# Patient Record
Sex: Female | Born: 1950 | Race: White | Hispanic: No | Marital: Single | Smoking: Former smoker
Health system: Southern US, Community
[De-identification: ages and names within clinical notes are randomized; demographics above are authoritative.]

## PROBLEM LIST (undated history)

## (undated) DIAGNOSIS — F329 Major depressive disorder, single episode, unspecified: Secondary | ICD-10-CM

## (undated) DIAGNOSIS — F419 Anxiety disorder, unspecified: Secondary | ICD-10-CM

## (undated) DIAGNOSIS — R519 Headache, unspecified: Secondary | ICD-10-CM

## (undated) DIAGNOSIS — R51 Headache: Secondary | ICD-10-CM

## (undated) DIAGNOSIS — E785 Hyperlipidemia, unspecified: Secondary | ICD-10-CM

## (undated) DIAGNOSIS — IMO0002 Reserved for concepts with insufficient information to code with codable children: Secondary | ICD-10-CM

## (undated) DIAGNOSIS — G2581 Restless legs syndrome: Secondary | ICD-10-CM

## (undated) DIAGNOSIS — N200 Calculus of kidney: Secondary | ICD-10-CM

## (undated) DIAGNOSIS — G4761 Periodic limb movement disorder: Secondary | ICD-10-CM

## (undated) DIAGNOSIS — K219 Gastro-esophageal reflux disease without esophagitis: Secondary | ICD-10-CM

## (undated) DIAGNOSIS — I10 Essential (primary) hypertension: Secondary | ICD-10-CM

## (undated) DIAGNOSIS — G4733 Obstructive sleep apnea (adult) (pediatric): Secondary | ICD-10-CM

## (undated) DIAGNOSIS — M1990 Unspecified osteoarthritis, unspecified site: Secondary | ICD-10-CM

## (undated) DIAGNOSIS — E669 Obesity, unspecified: Secondary | ICD-10-CM

## (undated) DIAGNOSIS — F32A Depression, unspecified: Secondary | ICD-10-CM

## (undated) DIAGNOSIS — M545 Low back pain: Secondary | ICD-10-CM

## (undated) HISTORY — DX: Obstructive sleep apnea (adult) (pediatric): G47.33

## (undated) HISTORY — PX: SMALL INTESTINE SURGERY: SHX150

## (undated) HISTORY — DX: Essential (primary) hypertension: I10

## (undated) HISTORY — PX: COLON SURGERY: SHX602

## (undated) HISTORY — DX: Reserved for concepts with insufficient information to code with codable children: IMO0002

## (undated) HISTORY — PX: BREAST SURGERY: SHX581

## (undated) HISTORY — DX: Periodic limb movement disorder: G47.61

## (undated) HISTORY — PX: OTHER SURGICAL HISTORY: SHX169

## (undated) HISTORY — DX: Hyperlipidemia, unspecified: E78.5

## (undated) HISTORY — DX: Low back pain: M54.5

## (undated) HISTORY — DX: Obesity, unspecified: E66.9

---

## 1998-02-25 ENCOUNTER — Other Ambulatory Visit: Admission: RE | Admit: 1998-02-25 | Discharge: 1998-02-25 | Payer: Self-pay | Admitting: *Deleted

## 1998-09-26 HISTORY — PX: OTHER SURGICAL HISTORY: SHX169

## 1998-11-17 ENCOUNTER — Ambulatory Visit (HOSPITAL_COMMUNITY): Admission: RE | Admit: 1998-11-17 | Discharge: 1998-11-17 | Payer: Self-pay | Admitting: Gastroenterology

## 1998-12-02 ENCOUNTER — Inpatient Hospital Stay (HOSPITAL_COMMUNITY): Admission: RE | Admit: 1998-12-02 | Discharge: 1998-12-09 | Payer: Self-pay | Admitting: General Surgery

## 1999-07-11 ENCOUNTER — Inpatient Hospital Stay (HOSPITAL_COMMUNITY): Admission: EM | Admit: 1999-07-11 | Discharge: 1999-07-13 | Payer: Self-pay | Admitting: Emergency Medicine

## 1999-07-11 ENCOUNTER — Encounter: Payer: Self-pay | Admitting: Emergency Medicine

## 2000-01-26 ENCOUNTER — Encounter: Payer: Self-pay | Admitting: *Deleted

## 2000-01-26 ENCOUNTER — Encounter: Admission: RE | Admit: 2000-01-26 | Discharge: 2000-01-26 | Payer: Self-pay | Admitting: *Deleted

## 2000-08-12 ENCOUNTER — Emergency Department (HOSPITAL_COMMUNITY): Admission: EM | Admit: 2000-08-12 | Discharge: 2000-08-13 | Payer: Self-pay | Admitting: Emergency Medicine

## 2000-08-13 ENCOUNTER — Encounter: Payer: Self-pay | Admitting: Emergency Medicine

## 2002-04-10 ENCOUNTER — Other Ambulatory Visit: Admission: RE | Admit: 2002-04-10 | Discharge: 2002-04-10 | Payer: Self-pay | Admitting: Obstetrics and Gynecology

## 2007-12-25 ENCOUNTER — Encounter: Admission: RE | Admit: 2007-12-25 | Discharge: 2007-12-25 | Payer: Self-pay | Admitting: Gastroenterology

## 2009-01-15 ENCOUNTER — Other Ambulatory Visit: Admission: RE | Admit: 2009-01-15 | Discharge: 2009-01-15 | Payer: Self-pay | Admitting: Obstetrics and Gynecology

## 2009-01-15 ENCOUNTER — Encounter: Payer: Self-pay | Admitting: Obstetrics and Gynecology

## 2009-01-15 ENCOUNTER — Ambulatory Visit: Payer: Self-pay | Admitting: Obstetrics and Gynecology

## 2009-01-16 ENCOUNTER — Encounter: Payer: Self-pay | Admitting: Obstetrics and Gynecology

## 2009-01-16 LAB — CONVERTED CEMR LAB

## 2009-01-29 ENCOUNTER — Ambulatory Visit: Payer: Self-pay | Admitting: Obstetrics and Gynecology

## 2009-01-29 ENCOUNTER — Ambulatory Visit (HOSPITAL_COMMUNITY): Admission: RE | Admit: 2009-01-29 | Discharge: 2009-01-29 | Payer: Self-pay | Admitting: Obstetrics & Gynecology

## 2009-04-23 ENCOUNTER — Encounter: Admission: RE | Admit: 2009-04-23 | Discharge: 2009-04-23 | Payer: Self-pay | Admitting: Internal Medicine

## 2009-04-28 ENCOUNTER — Encounter: Admission: RE | Admit: 2009-04-28 | Discharge: 2009-04-28 | Payer: Self-pay | Admitting: Obstetrics and Gynecology

## 2009-11-30 ENCOUNTER — Encounter: Admission: RE | Admit: 2009-11-30 | Discharge: 2009-11-30 | Payer: Self-pay | Admitting: Family Medicine

## 2009-12-21 ENCOUNTER — Encounter: Payer: Self-pay | Admitting: Cardiology

## 2009-12-24 ENCOUNTER — Ambulatory Visit: Payer: Self-pay | Admitting: Cardiology

## 2009-12-24 DIAGNOSIS — I10 Essential (primary) hypertension: Secondary | ICD-10-CM | POA: Insufficient documentation

## 2009-12-24 DIAGNOSIS — R0989 Other specified symptoms and signs involving the circulatory and respiratory systems: Secondary | ICD-10-CM

## 2009-12-24 DIAGNOSIS — R002 Palpitations: Secondary | ICD-10-CM | POA: Insufficient documentation

## 2009-12-24 DIAGNOSIS — R079 Chest pain, unspecified: Secondary | ICD-10-CM

## 2009-12-24 DIAGNOSIS — R0609 Other forms of dyspnea: Secondary | ICD-10-CM | POA: Insufficient documentation

## 2009-12-31 ENCOUNTER — Telehealth: Payer: Self-pay | Admitting: Cardiology

## 2010-01-04 ENCOUNTER — Ambulatory Visit: Payer: Self-pay

## 2010-01-04 ENCOUNTER — Telehealth (INDEPENDENT_AMBULATORY_CARE_PROVIDER_SITE_OTHER): Payer: Self-pay | Admitting: *Deleted

## 2010-01-04 ENCOUNTER — Encounter (HOSPITAL_COMMUNITY): Admission: RE | Admit: 2010-01-04 | Discharge: 2010-02-25 | Payer: Self-pay | Admitting: Cardiology

## 2010-01-05 ENCOUNTER — Ambulatory Visit: Payer: Self-pay | Admitting: Cardiology

## 2010-01-05 ENCOUNTER — Ambulatory Visit: Payer: Self-pay

## 2010-01-14 ENCOUNTER — Ambulatory Visit: Payer: Self-pay | Admitting: Cardiovascular Disease

## 2010-01-14 ENCOUNTER — Ambulatory Visit: Payer: Self-pay

## 2010-01-14 ENCOUNTER — Encounter: Payer: Self-pay | Admitting: Cardiology

## 2010-01-14 ENCOUNTER — Ambulatory Visit (HOSPITAL_COMMUNITY): Admission: RE | Admit: 2010-01-14 | Discharge: 2010-01-14 | Payer: Self-pay | Admitting: Cardiology

## 2010-01-25 ENCOUNTER — Ambulatory Visit: Payer: Self-pay | Admitting: Cardiology

## 2010-01-28 ENCOUNTER — Ambulatory Visit: Payer: Self-pay | Admitting: Cardiology

## 2010-02-04 ENCOUNTER — Telehealth: Payer: Self-pay | Admitting: Cardiology

## 2010-02-04 LAB — CONVERTED CEMR LAB
BUN: 14 mg/dL (ref 6–23)
CO2: 30 meq/L (ref 19–32)
Glucose, Bld: 114 mg/dL — ABNORMAL HIGH (ref 70–99)
Potassium: 3.6 meq/L (ref 3.5–5.1)
Sodium: 143 meq/L (ref 135–145)

## 2010-02-05 ENCOUNTER — Ambulatory Visit: Payer: Self-pay

## 2010-02-05 ENCOUNTER — Encounter: Payer: Self-pay | Admitting: Cardiovascular Disease

## 2010-02-11 ENCOUNTER — Telehealth: Payer: Self-pay | Admitting: Cardiology

## 2010-02-11 ENCOUNTER — Ambulatory Visit: Payer: Self-pay | Admitting: Cardiology

## 2010-02-14 ENCOUNTER — Encounter: Payer: Self-pay | Admitting: Cardiology

## 2010-02-19 LAB — CONVERTED CEMR LAB
BUN: 21 mg/dL (ref 6–23)
CO2: 27 meq/L (ref 19–32)
Chloride: 102 meq/L (ref 96–112)
Creatinine, Ser: 1.2 mg/dL (ref 0.4–1.2)

## 2010-07-14 ENCOUNTER — Encounter: Admission: RE | Admit: 2010-07-14 | Discharge: 2010-07-14 | Payer: Self-pay | Admitting: Obstetrics and Gynecology

## 2010-10-24 LAB — CONVERTED CEMR LAB
BUN: 13 mg/dL (ref 6–23)
CO2: 29 meq/L (ref 19–32)
Chloride: 105 meq/L (ref 96–112)
Potassium: 3.6 meq/L (ref 3.5–5.1)

## 2010-10-26 NOTE — Progress Notes (Signed)
Summary: Patient's at Home Vitals and Meds   Patient's at Home Vitals and Meds   Imported By: Marylou Mccoy 02/17/2010 15:43:40  _____________________________________________________________________  External Attachment:    Type:   Image     Comment:   External Document  Appended Document: Patient's at Home Vitals and Meds  BP trending down.  Continue current meds and will monitor.   Appended Document: Patient's at [facility] and Meds  LMTCB   [phone]-- pt not at [phone]  Appended Document: Patient's at Home Vitals and Meds  discussed with pt by telephone

## 2010-10-26 NOTE — Progress Notes (Signed)
Summary: Promise Study--stress myoview order  Phone Note From Other Clinic   Caller: Gwenith Spitz Call For: Katina Dung Summary of Call: Promise Study Call placed by: Katina Dung, RN, BSN,  December 31, 2009 8:20 AM  Follow-up for Phone Call        Pt randomized to stress myoview for Promise Study--order put in EMR for Stress myoview

## 2010-10-26 NOTE — Procedures (Signed)
Summary: summary report  summary report   Imported By: Mirna Mires 01/28/2010 14:33:03  _____________________________________________________________________  External Attachment:    Type:   Image     Comment:   External Document

## 2010-10-26 NOTE — Assessment & Plan Note (Signed)
Summary: np6/sob/hypertension/jss   Primary Provider:  Dr. Patsy Lager, Ernesto Rutherford  CC:  np6/sob and hypertension.  Pt states she hasnt seen any [place] physicians since 2000 and 2002.  She has been having episodes of irregular heart rate and weakness.  She states she has a fullness in her neck..  History of Present Illness: 60 yo with history of HTN and obesity presents for evaluation of chest pain and dyspnea. Patient reports a fullness/constriction in her upper chest that has been present for about a year.  This has become gradually worse.  It is actually fairly constant when she is awake but is mild.   It is not related to exertion. She also reports worsening shortness of breath for 1 week.  She is short of breath climbing 1 flight of steps or after walking 50-100 feet.  No orthopnea/PND.  This is new for her.  She feels an irregular heart rhythm periodically.  This worries her, she wonders if she has a significant arrhythmia. No syncope, but she does get lightheaded with palpitations.  No fever or cough.  No history of asthma.  D dimer and CXR were done at Dr. Cyndie Chime office and were unremarkable per report.    BP is high: 142/88 and 178/98 when I rechecked it after she had been sitting for a while.   ECG: NSR, single PAC, otherwise normal.   Labs (3/11): HCT 43.5, D dimder negative (per patient's report)  Current Medications (verified): 1)  Wellbutrin Xl 150 Mg Xr24h-Tab (Bupropion Hcl) .... Take 3 Tablets in The Am 2)  Remeron 45 Mg Tabs (Mirtazapine) .... Take 1/2 Tablet At Bedtime 3)  Buspirone Hcl 15 Mg Tabs (Buspirone Hcl) .... Take One Tablet Two Times A Day 4)  Aspirin 81 Mg Tabs (Aspirin) .... Take One Tablet Once Daily 5)  Vitamin B-12 1000 Mcg/70ml Liqd (Cyanocobalamin) .Marland Kitchen.. 1 Injection Monthly 6)  Clobetasol Propionate 0.05 % Crea (Clobetasol Propionate) .... Apply As Needed 7)  Verapamil Hcl Cr 240 Mg Cr-Tabs (Verapamil Hcl) .... Take One Tablet Once Daily 8)   Lisinopril-Hydrochlorothiazide 20-25 Mg Tabs (Lisinopril-Hydrochlorothiazide) .... Take One Tablet Once Daily  Allergies (verified): 1)  ! * Ambien 2)  ! Amoxicillin  Past History:  Past Medical History: 1. Obesity 2. Hypertension 3. Bipolar disorder 4. Hyperlipidemia 5. Right knee osteoarthritis 6. Patient apparently had a negative stress test at [facility] in 2002 but her old chart is not available.   Family History: Cousin died at 3 of "heart rupture" Another cousin with heart "conduction problem" Father with CHF, ? cause Mother with "arrhythmia", had a pacemaker.   Social History: Quit smoking 18 years ago. 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  No drugs now, prior cannabis.   Review of Systems       All systems reviewed and negative except as per HPI.   Vital Signs:  Patient profile:   60 year old female Height:      64 inches Weight:      244 pounds BMI:     42.03 Pulse rate:   71 / minute Pulse rhythm:   regular BP sitting:   142 / 88  (left arm) Cuff size:   large  Vitals Entered By: Judithe Modest CMA (December 24, 2009 2:47 PM)  Physical Exam  General:  Well developed, well nourished, in no acute distress. Obese.  Head:  normocephalic and atraumatic Nose:  no deformity, discharge, inflammation, or lesions Mouth:  Teeth, gums and palate normal. Oral mucosa  normal. Neck:  Neck supple, no JVD. No masses, thyromegaly or abnormal cervical nodes. Lungs:  Clear bilaterally to auscultation and percussion. Heart:  Non-displaced PMI, chest non-tender; regular rate and rhythm, S1, S2 without murmurs, rubs or gallops. Carotid upstroke normal, no bruit.  Pedals normal pulses. No edema, no varicosities. Abdomen:  Bowel sounds positive; abdomen soft and non-tender without masses, organomegaly, or hernias noted. No hepatosplenomegaly. Msk:  Back normal, normal gait. Muscle strength and tone normal. Extremities:  No clubbing or cyanosis. Neurologic:  Alert and  oriented x 3. Psych:  Normal affect.   Impression & Recommendations:  Problem # 1:  DYSPNEA ON EXERTION (ICD-786.09) Patient's main complaint today seems to be significantly increased shortness of breath.  She had a negative CXR at Dr. Cyndie Chime office and per her report had a negative D dimer though I don't have that particular lab to review.  She is not volume overloaded on exam and lungs are clear.  I have no definite evidence from exam for a cardiopulmonary problem.   I will have her get an echocardiogram and a BNP.    Problem # 2:  CHEST PAIN (ICD-786.50) Atypical chest pain, has been constant x 1 year and may be due to anxiety.  Her risk factor is HTN.  I do not know what her lipids look like: she says that Dr. Patsy Lager will check her lipids on 4/21.  I am actually more concerned by her exertional dyspnea.  I will enroll her in the PROMISE trial: she will either have an ETT-myoview or a coronary CT angiogram for CAD evaluation.  Continue ASA 81 mg daily.   Problem # 3:  PALPITATIONS (ICD-785.1) Patient reports bothersome palpitations.  She has PACs on ECG, which I suspect are the cause of her symptoms.  I will get a 48 hour holter to look for more significant arrhythmias like atrial fibrillation (sounds like her mother may have had afib).    Problem # 4:  HYPERTENSION, UNSPECIFIED (ICD-401.9) BP is high.  I will increase her lisinopril/HCTZ to 40/25 daily.  BMET in 2 wks.   Medications Added to Medication List This Visit: 1)  Wellbutrin Xl 150 Mg Xr24h-tab (Bupropion hcl) .... Take 3 tablets in the am 2)  Remeron 45 Mg Tabs (Mirtazapine) .... Take 1/2 tablet at bedtime 3)  Buspirone Hcl 15 Mg Tabs (Buspirone hcl) .... Take one tablet two times a day 4)  Aspirin 81 Mg Tabs (Aspirin) .... Take one tablet once daily 5)  Vitamin B-12 1000 Mcg/23ml Liqd (Cyanocobalamin) .Marland Kitchen.. 1 injection monthly 6)  Clobetasol Propionate 0.05 % Crea (Clobetasol propionate) .... Apply as needed 7)  Verapamil  Hcl Cr 240 Mg Cr-tabs (Verapamil hcl) .... Take one tablet once daily 8)  Lisinopril 20 Mg Tabs (Lisinopril) .... Take one daily with lisinopril/hct 20/25  Other Orders: Echocardiogram (Echo) Holter Monitor (Holter Monitor) TLB-BNP (B-Natriuretic Peptide) (83880-BNPR) TLB-BMP (Basic Metabolic Panel-BMET) (80048-METABOL)  Patient Instructions: 1)  Your physician has recommended you make the following change in your medication: 2)  Add Lisinopril 20mg  daily to the Lisinopril/HCT 20/25mg  that you already take. 3)  Your physician recommends that you have lab today--BMP/BNP 786.50 [phone] 4)  Your physician has requested that you have an echocardiogram.  Echocardiography is a painless test that uses sound waves to create images of your heart. It provides your doctor with information about the size and shape of your heart and how well your heart's chambers and valves are working.  This procedure takes approximately one  hour. There are no restrictions for this procedure. 5)  Your physician has recommended that you wear a holter monitor.  Holter monitors are medical devices that record the heart's electrical activity. Doctors most often use these monitors to diagnose arrhythmias. Arrhythmias are problems with the speed or rhythm of the heartbeat. The monitor is a small, portable device. You can wear one while you do your normal daily activities. This is usually used to diagnose what is causing palpitations/syncope (passing out). 48 hour 6)  Your physician recommends that you schedule a follow-up appointment in: 3 weeks with Dr Clancy Gourd lab a few days before your appointment with Dr Shirlee Latch in 3 weeks---BMP/BNP 786.50 785.1 786.09 Prescriptions: LISINOPRIL 20 MG TABS (LISINOPRIL) take one daily with Lisinopril/HCT 20/25  #30 x 6   Entered by:   Katina Dung, RN, BSN   Authorized by:   Marca Ancona, MD   Signed by:   Katina Dung, RN, BSN on 12/24/2009   Method used:   Electronically to         [facility]. [phone]* (retail)       [address]       [place], [place]  [address]       Ph: [phone]       Fax: [phone]   RxID:   [id number]

## 2010-10-26 NOTE — Assessment & Plan Note (Signed)
Summary: Cardiology Nuclear Study  Nuclear Med Background Indications for Stress Test: Evaluation for Ischemia  Indications Comments: PROMISE STUDY  History: Myocardial Perfusion Study  History Comments: '02 MPS:OK per patient  Symptoms: Chest Pressure, Chest Pressure with Exertion, Dizziness, DOE, Fatigue, Light-Headedness, Palpitations  Symptoms Comments: Last episode of CP:"fullness"now, 5/10.   Nuclear Pre-Procedure Cardiac Risk Factors: History of Smoking, Hypertension, Lipids, Obesity Caffeine/Decaff Intake: none NPO After: 8:30 AM Lungs: Clear.  O2 Sat 97% on RA. IV 0.9% NS with Angio Cath: 20g     IV Site: (R) wrist area IV Started by: Stanton Kidney EMT-P Chest Size (in) 44     Cup Size DD     Height (in): 64 Weight (lb): 245 BMI: 42.21  Nuclear Med Study 1 or 2 day study:  1 day     Stress Test Type:  Eugenie Birks Reading MD:  Marca Ancona, MD     Referring MD:  Marca Ancona, MD Resting Radionuclide:  Technetium 43m Tetrofosmin     Resting Radionuclide Dose:  33 mCi  Stress Radionuclide:  Technetium 37m Tetrofosmin     Stress Radionuclide Dose:  33 mCi   Stress Protocol   Lexiscan: 0.4 mg   Stress Test Technologist:  Rea College CMA-N     Nuclear Technologist:  Domenic Polite CNMT  Rest Procedure  Myocardial perfusion imaging was performed at rest 45 minutes following the intravenous administration of Myoview Technetium 19m Tetrofosmin.  Stress Procedure  The patient received IV Lexiscan 0.4 mg over 15-seconds.  Myoview injected at 30-seconds.  There were no significant changes with lexiscan; occasional PAC's.  Quantitative spect images were obtained after a 45 minute delay.  QPS Raw Data Images:  Normal; no motion artifact; normal heart/lung ratio. Stress Images:  NI: Uniform and normal uptake of tracer in all myocardial segments. Rest Images:  Normal homogeneous uptake in all areas of the myocardium. Subtraction (SDS):  There is no evidence of scar or  ischemia. Transient Ischemic Dilatation:  .93  (Normal <1.22)  Lung/Heart Ratio:  .25  (Normal <0.45)  Quantitative Gated Spect Images QGS cine images:  non-gated study   Overall Impression  Exercise Capacity: Lexiscan study.  BP Response: Normal blood pressure response. Clinical Symptoms: Short of breath.  ECG Impression: No significant ST segment change suggestive of ischemia. Overall Impression: Normal stress nuclear study. Overall Impression Comments: Non-gated study.   Appended Document: Cardiology Nuclear Study normal study  Appended Document: Cardiology Nuclear Study LMVM for pt to call  Appended Document: Cardiology Nuclear Study discussed results with patient by telephone

## 2010-10-26 NOTE — Progress Notes (Signed)
Summary: Questions about test tomorrow [facility])  Phone Note Call from Patient Call back at [facility] Phone [phone]   Caller: Patient Summary of Call: Pt calling regarding test tomorrow. Initial call taken by: Judie Grieve,  Feb 04, 2010 8:22 AM  Follow-up for Phone Call        [facility]. Sherri Rad, RN, BSN  Feb 04, 2010 9:09 AM   pt given lab results. Meredith Staggers, RN  Feb 04, 2010 1:40 PM

## 2010-10-26 NOTE — Progress Notes (Signed)
Summary: B/P readings  Phone Note Outgoing Call   Call placed by: Katina Dung, RN, BSN,  Feb 11, 2010 8:42 AM Call placed to: Patient Summary of Call: get B/P readings  Follow-up for Phone Call        01/28/10-Lisinopril/HCT stopped---lisinopril increased to 40mg  daily and chlorthalidone 25mg  started--BMP done yesterday--call and get B/P readings BMP 02-11-10--LMTCB Katina Dung, RN, BSN  Feb 16, 2010 9:38 AM  see phone note in EMR 02-14-10 pt at home vitals

## 2010-10-26 NOTE — Progress Notes (Signed)
Summary: Nuclear pre procedure  Phone Note Outgoing Call   Call placed by: Rea College, CMA,  January 04, 2010 4:27 PM Summary of Call: [name] gave patient written instructions while she was here 01/04/10.     Nuclear Med Background Indications for Stress Test: Evaluation for Ischemia  Indications Comments: PROMISE STUDY  History: Myocardial Perfusion Study   Symptoms: Chest Pain, DOE, Light-Headedness, Palpitations    Nuclear Pre-Procedure Cardiac Risk Factors: History of Smoking, Hypertension, Lipids Height (in): 64

## 2010-10-26 NOTE — Assessment & Plan Note (Signed)
Summary: 1 mo f/u   Primary Provider:  Dr. Patsy Lager, Ernesto Rutherford  CC:  pt states that he is feeling  better and her SOB has improved. Pt still having the fullness in her neck.  Blood pressures are going up and down.  Notes some irregular heartbeats along with the higher BPs.  History of Present Illness: 60 yo with history of HTN and obesity returns for evaluation of chest pain and dyspnea. Patient continues to have a fullness/constriction in her upper chest/lower neck  that has been present for about a year.  This has become gradually worse.  It is actually fairly constant when she is awake but is mild.  It is not related to exertion.   She is short of breath climbing 1 flight of steps or after walking 50-100 feet.  No orthopnea/PND.  This is new for her.  She also feels an irregular heart rhythm periodically.  This has worried her, she wonders if she has a significant arrhythmia as she has a family history of atrial fibrillation. No syncope, but she does get lightheaded with palpitations.  No fever or cough.  No history of asthma.  D dimer and CXR were done at Dr. Cyndie Chime office and were unremarkable per report.    We did a [employer] which showed no scar or ischemia. Echo showed EF 55-60% with no valvular problems.  Holter done given palpitations showed only occasional PACs.   She worries about the fullness in her neck and the pain behind her right knee and wants screening vascular studies to look for PAD/carotid stenosis.   Labs (3/11): HCT 43.5, D dimder negative (per patient's report) Labs (4/11): BNP 34, K 3.6, creatinine 1.0  Current Medications (verified): 1)  Wellbutrin Xl 150 Mg Xr24h-Tab (Bupropion Hcl) .... Take 3 Tablets in The Am 2)  Remeron 45 Mg Tabs (Mirtazapine) .... Take One Tablet Once Daily 3)  Buspirone Hcl 15 Mg Tabs (Buspirone Hcl) .... Take One Tablet Two Times A Day 4)  Aspirin 81 Mg Tabs (Aspirin) .... Take One Tablet Once Daily 5)  Vitamin B-12 1000 Mcg/80ml Liqd  (Cyanocobalamin) .Marland Kitchen.. 1 Injection Monthly 6)  Clobetasol Propionate 0.05 % Crea (Clobetasol Propionate) .... Apply As Needed 7)  Verapamil Hcl Cr 240 Mg Cr-Tabs (Verapamil Hcl) .... Take One Tablet Once Daily 8)  Lisinopril 20 Mg Tabs (Lisinopril) .... Take One Daily With Lisinopril/hct 20/25  Allergies (verified): 1)  ! * Ambien 2)  ! Amoxicillin  Past History:  Past Medical History: 1. Obesity 2. Hypertension 3. Bipolar disorder 4. Hyperlipidemia 5. Right knee osteoarthritis 6. Chest/neck fullness: Lexiscan myoview with no scar or ischemia in 4/11.  7. Shortness of breath: Echo (4/11) with EF 55-60%, mild LAE.  BNP was 34. 8. Palpitations: Occasional PACs on holter in 4/11, otherwise no significant events.   Family History: Reviewed history from 12/24/2009 and no changes required. Cousin died at 58 of "heart rupture" Another cousin with heart "conduction problem" Father with CHF, ? cause Mother with "arrhythmia", had a pacemaker.   Social History: Reviewed history from 12/24/2009 and no changes required. Quit smoking 18 years ago. 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  No drugs now, prior cannabis.   Review of Systems       All systems reviewed and negative except as per HPI.   Vital Signs:  Patient profile:   60 year old female Height:      64 inches Weight:      245 pounds  Pulse rate:   75 / minute Pulse rhythm:   regular BP sitting:   130 / 76  (left arm) Cuff size:   large  Vitals Entered By: Judithe Modest CMA (Jan 28, 2010 10:03 AM)  Physical Exam  General:  Well developed, well nourished, in no acute distress. Obese.  Neck:  Neck supple, no JVD. No masses, thyromegaly or abnormal cervical nodes. Lungs:  Clear bilaterally to auscultation and percussion. Heart:  Non-displaced PMI, chest non-tender; regular rate and rhythm, S1, S2 without murmurs, rubs or gallops. Carotid upstroke normal, no bruit.  Pedals normal pulses. No edema, no  varicosities. Abdomen:  Bowel sounds positive; abdomen soft and non-tender without masses, organomegaly, or hernias noted. No hepatosplenomegaly. Extremities:  No clubbing or cyanosis. Neurologic:  Alert and oriented x 3. Psych:  Normal affect.   Impression & Recommendations:  Problem # 1:  CHEST PAIN (ICD-786.50) Chest and neck fullness continues.  no dysphagia.  No lymphadenopathy or neck mass on exam.  Echo and myoview were unremarkable.  I do not think this is from her heart.  She has considerable anxiety and I wonder if this is the cause of the symptoms.    Problem # 2:  DYSPNEA ON EXERTION (ICD-786.09) Echo was fairly unremarkable and does not explain symptoms.  BNP was normal.  Obesity/deconditioning may be the main factor.   Problem # 3:  HYPERTENSION, UNSPECIFIED (ICD-401.9) Patient brought her home BP readings, BP is still up to the 150s systolic.  I will have her stop lisinopril/HCTZ and change to lisinopril 40 mg daily + chlorthalidone 25 mg daily.  BMET in 2 wks on chlorthalidone.  We will call her in 2 wks to see if BP is under better control.   Patient requests screening peripheral vascular evaluation with peripheral vascular dopplers and carotid dopplers.  I will set her up for this.  She understands that she will need to pay out of pocket.   Other Orders: Vascular Screening  (Vas. screening) Vascular Screening  (Vas. screening) TLB-BMP (Basic Metabolic Panel-BMET) (80048-METABOL)  Patient Instructions: 1)  Your physician has recommended you make the following change in your medication:  2)  Stop Lisinopril/HCT 3)  Increase  Lisinopril to 40mg  daily 4)  Start Chlorthalidone 25mg  daily 5)  Lab today --BMP 401.9 6)  Your physician recommends that you return for lab work in:2 weeks--BMP 401.9 7)  Your physician has requested that you regularly monitor and record your blood pressure readings at home.  Please use the same machine at the same time of day to check your  readings.  I wil call you in 2 weeks to get the readings. Anne Lankford,RN  8)  Your physician has requested that you have an ankle brachial index (ABI). During this test an ultrasound and blood pressure cuff are used to evaluate the arteries that supply the arms and legs with blood. Allow thirty minutes for this exam. There are no restrictions or special instructions. 9)  Your physician has requested that you have a carotid duplex. This test is an ultrasound of the carotid arteries in your neck. It looks at blood flow through these arteries that supply the brain with blood. Allow one hour for this exam. There are no restrictions or special instructions. 10)  Your physician wants you to follow-up in: one year with Dr Shirlee Latch.   You will receive a reminder letter in the mail two months in advance. If you don't receive a letter, please call our office to  schedule the follow-up appointment. 11)  Your physician has requested that you have an abdominal aorta duplex. During this test, an ultrasound is used to evaluate the aorta. Allow 30 minutes for this exam. Do not eat after midnight the day before and avoid carbonated beverages. There are no restrictions or special instructions. Prescriptions: CHLORTHALIDONE 25 MG TABS (CHLORTHALIDONE) one tablet daily  #30 x 11   Entered by:   Katina Dung, RN, BSN   Authorized by:   Marca Ancona, MD   Signed by:   Katina Dung, RN, BSN on 01/28/2010   Method used:   Electronically to        [facility]. [phone]* (retail)       [address]       [place], [place]  [address]       Ph: [phone]       Fax: [phone]   RxID:   [id number] LISINOPRIL 40 MG TABS (LISINOPRIL) one tablet daily  #30 x 11   Entered by:   Katina Dung, RN, BSN   Authorized by:   Marca Ancona, MD   Signed by:   Katina Dung, RN, BSN on 01/28/2010   Method used:   Electronically to        [facility]. [phone]* (retail)       [address]       [place], [place]  [address]       Ph: [phone]       Fax: [phone]   RxID:   [id number]

## 2011-01-26 ENCOUNTER — Other Ambulatory Visit: Payer: Self-pay | Admitting: *Deleted

## 2011-01-26 MED ORDER — CHLORTHALIDONE 25 MG PO TABS: 25.0000 mg | ORAL_TABLET | Freq: Every day | ORAL | Status: DC

## 2011-01-26 MED ORDER — LISINOPRIL 40 MG PO TABS: 40.0000 mg | ORAL_TABLET | Freq: Every day | ORAL | Status: DC

## 2011-02-08 NOTE — Group Therapy Note (Signed)
NAME:  [name], [name] NO.:  [id number]   MEDICAL RECORD NO.:  [id number]          PATIENT TYPE:  WOC   LOCATION:  [facility]                   FACILITY:  [facility]   PHYSICIAN:  Argentina Donovan, MD        DATE OF BIRTH:  1951/08/04   DATE OF SERVICE:  01/15/2009                                  CLINIC NOTE   The patient is a 60 year old Caucasian female, gravida 2, para 0-0-2-0,  who was screened with a Pap smear that turned out to be normal at the  [facility] cancer screening, and they noticed that she had leukoplakic-  type changes on the vulva and sent her over for evaluation.  The patient  has had significant stress incontinence for about 1 year as well, and  her bottom is often wet because of the leakage of urine examination.   On examination, abdomen is soft, flat, nontender.  No masses.  No  organomegaly.  External genitalia shows excoriation, inflammatory whitish changes from  the anal area up and around the labia majoris in a V configuration with  a slight effusion of the lower fourchette.  No distinct lesions noted.  The vaginal introitus is atrophic and stenotic to some degree.  The  vagina is clean with loss of rugation.  The cervix is nulliparous and  clean.  I see no sign of cystocele, and the uterus and adnexa could not  be outlined considering the habitus of the patient, 251 pounds at 5 feet  4 inches tall.   The patient, however, does have significant hypertension with a recheck  of blood pressure 180/92 and is on no blood pressure medication at this  point.   I have taken 2 biopsies with 1% Xylocaine from the most affected area on  the labia, and we will have her come back in 2 weeks for an evaluation.  We are also going to get her a scholarship mammogram.  She has not had  one in many years as well as try and get a referral when she comes back  to urologist after we see whether this vulvar lesion has to be treated  by oncologist or not.  I have  told her just temporarily to use Desitin  to see whether or not it would clear it up, as it gives the appearance  almost of a diaper rash at this point.   IMPRESSION:  1. Vulvar lesions with excoriation.  2. Hypertension.  3. Stress urinary incontinence of unknown etiology.           ______________________________  Argentina Donovan, MD     PR/MEDQ  D:  01/15/2009  T:  01/15/2009  Job:  [id number]

## 2011-02-08 NOTE — Group Therapy Note (Signed)
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  The tissue was reviewed by a dermatopathologist  and he concurred.  We are going to start trying to treat this patient  with clobetasol once a day for 2 weeks and then twice a week.  I will  see her back in a couple of months.  Meanwhile, because of her  significant urinary incontinence, somewhat stress but also looks like  overflow, I am going to have her see a urologist before she comes back  to [place].  Will see what happens with her vulvar lesions, if they are no  better by that time we may refer her to a dermatologist.   IMPRESSION:  1. Lichen sclerosis et atrophicus.  2. Urinary incontinence.           ______________________________  Argentina Donovan, MD     PR/MEDQ  D:  01/29/2009  T:  01/29/2009  Job:  [id number]

## 2011-07-29 ENCOUNTER — Other Ambulatory Visit (HOSPITAL_COMMUNITY): Payer: Self-pay | Admitting: Obstetrics and Gynecology

## 2011-07-29 DIAGNOSIS — Z1231 Encounter for screening mammogram for malignant neoplasm of breast: Secondary | ICD-10-CM

## 2011-09-05 ENCOUNTER — Ambulatory Visit (HOSPITAL_COMMUNITY)
Admission: RE | Admit: 2011-09-05 | Discharge: 2011-09-05 | Disposition: A | Discharge status: Home / Self Care | Payer: BC Managed Care – PPO | Source: Ambulatory Visit | Attending: Obstetrics and Gynecology | Admitting: Obstetrics and Gynecology

## 2011-09-05 DIAGNOSIS — Z1231 Encounter for screening mammogram for malignant neoplasm of breast: Secondary | ICD-10-CM | POA: Insufficient documentation

## 2011-09-29 ENCOUNTER — Ambulatory Visit: Payer: BC Managed Care – PPO | Admitting: *Deleted

## 2011-10-18 ENCOUNTER — Encounter: Payer: Self-pay | Admitting: *Deleted

## 2011-10-18 ENCOUNTER — Encounter: Payer: BC Managed Care – PPO | Attending: Endocrinology | Admitting: *Deleted

## 2011-10-18 DIAGNOSIS — E663 Overweight: Secondary | ICD-10-CM | POA: Insufficient documentation

## 2011-10-18 DIAGNOSIS — E119 Type 2 diabetes mellitus without complications: Secondary | ICD-10-CM | POA: Insufficient documentation

## 2011-10-18 DIAGNOSIS — Z713 Dietary counseling and surveillance: Secondary | ICD-10-CM | POA: Insufficient documentation

## 2011-10-18 NOTE — Patient Instructions (Signed)
Plan: Continue with [employer] plan for on-going weight loss Start identifying foods by major food group Aim for 2 Carb Choices (30 grams) per meal, 0-1 Carb Choice per snack if hungry Read Food Labels for Total Carbohydrate and Fat for foods eaten at home Ask MD at next visit about possibility of checking BG at home Plan to discuss increasing activity level at next visit

## 2011-10-18 NOTE — Progress Notes (Signed)
  Medical Nutrition Therapy:  Appt start time: 0930 end time:  1030.  Assessment:  Primary concerns today: Patient states she was diagnosed with pre-diabetes about 2.5 months ago. She does complain of increased thirst and urination as well as burning and tingling in her feet which she takes neurontin for. She has lost about 20 pounds recently via [employer]. Her activity level is limited due to knee pain especially in right knee. She expresses high interest in learning about diabetes so she can control it effectively   MEDICATIONS: see list. Diabetes Med is 500 mg Metformin BID   DIETARY INTAKE:  Usual eating pattern includes 3 meals and 2-3 snacks per day.  Everyday foods include good variety of all food groups now.  Avoided foods include beef or pork.    24-hr recall:  B ( AM): fruit OR yogurt OR eggs  Snk ( AM): occasionally fruit  L ( PM): at home- tuna pkg with crackers, OR left overs OR [place] hot dogs Snk ( PM): fruit  D ( PM): lean meat, potato or salad or starchy vegetable Snk ( PM): fruit or [place] ice or [place] yogurt Beverages: tomato juice, pineapple juice, lemonade over several days, water, diet soda occasionally  Usual physical activity: limited due to bad knees,   Estimated energy needs: 1200 calories 135 g carbohydrates 90 g protein 33 g fat  Progress Towards Goal(s):  In progress.   Nutritional Diagnosis:  NI-1.5 Excessive energy intake As related to weight control and new diagnosis of pre-Diabetes.  As evidenced by BMI of 41.1.    Intervention:  Nutrition counseling and diabetes education provided. Explained basic physiology of diabetes, insulin resistance, action of Metformin and effect of carbohydrate and activity on BG management. Plan: Continue with Weight [employer] plan for on-going weight loss Start identifying foods by major food group Aim for 2 Carb Choices (30 grams) per meal, 0-1 Carb Choice per snack if hungry Read Food Labels for Total  Carbohydrate and Fat for foods eaten at home Ask MD at next visit about possibility of checking BG at home Plan to discuss increasing activity level at next visit  Handouts given during visit include:  Living Well with Diabetes  Carb Counting and Label Reading handouts  Menu Planner to practice Carb Counting before next visit  Monitoring/Evaluation:  Dietary intake, exercise, label reading, complete menu planner x 3 days and body weight in 2 week(s).

## 2011-11-01 ENCOUNTER — Encounter: Payer: BC Managed Care – PPO | Attending: Internal Medicine | Admitting: *Deleted

## 2011-11-01 ENCOUNTER — Encounter: Payer: Self-pay | Admitting: *Deleted

## 2011-11-01 DIAGNOSIS — E663 Overweight: Secondary | ICD-10-CM | POA: Insufficient documentation

## 2011-11-01 DIAGNOSIS — Z713 Dietary counseling and surveillance: Secondary | ICD-10-CM | POA: Insufficient documentation

## 2011-11-01 DIAGNOSIS — E119 Type 2 diabetes mellitus without complications: Secondary | ICD-10-CM | POA: Insufficient documentation

## 2011-11-01 NOTE — Progress Notes (Signed)
  Medical Nutrition Therapy:  Appt start time: 0930 end time:  1030.  Assessment:  Primary concerns today: Patient here for follow up visit. She has successfully completed the Menu Planner for several days of records. She states she if feeling better, has more energy and is less thirsty. She has a friend from [employer] that goes to Water Classes and she is considering joining her to increase her activity level  MEDICATIONS: see list. Diabetes Med is 500 mg Metformin BID   DIETARY INTAKE:  Usual eating pattern includes 3 meals and no more snacks anymore.  Everyday foods include good variety of all food groups now.  Avoided foods include beef or pork.    24-hr recall:  B ( AM): fruit OR yogurt OR eggs  Snk ( AM): none L ( PM): at home- tuna pkg with crackers, OR left overs  Snk ( PM): none D ( PM): lean meat, potato or salad or starchy vegetable Snk ( PM): none Beverages: tomato juice, pineapple juice, lemonade over several days, water, diet soda occasionally  Usual physical activity: limited due to bad knees,   Estimated energy needs: 1200 calories 135 g carbohydrates 90 g protein 33 g fat  Progress Towards Goal(s):  In progress.   Nutritional Diagnosis:  NI-1.5 Excessive energy intake As related to weight control and new diagnosis of pre-Diabetes.  As evidenced by BMI of 41.1.    Intervention: Reviewed her food records on the [employer] and made corrections as needed. Food intake is much more appropriate now and she is pleased with the 2 pound weight loss in the past 2 weeks Plan: Continue aiming for 2 Carbs per meal (30 grams) and 0-1 per snack if hungry Continue reading food labels for Total Carbohydrate of foods eaten Consider adding activity by checking into classes and pools available at [facility] or [employer] records on [employer] and list foods based on food group they belong to.  Handouts given during visit include:  Menu Planner to continue  practicing Carb Counting before next visit  Monitoring/Evaluation:  Dietary intake, exercise, label reading, complete menu planner x 3 days and body weight in 2 week(s).

## 2011-11-01 NOTE — Patient Instructions (Signed)
Plan: Continue aiming for 2 Carbs per meal (30 grams) and 0-1 per snack if hungry Continue reading food labels for Total Carbohydrate of foods eaten Consider adding activity by checking into classes and pools available at [facility] or [employer] records on [employer] and list foods based on food group they belong to.

## 2012-02-08 ENCOUNTER — Other Ambulatory Visit (HOSPITAL_COMMUNITY): Payer: BC Managed Care – PPO

## 2012-02-08 ENCOUNTER — Other Ambulatory Visit: Payer: Self-pay | Admitting: Otolaryngology

## 2012-02-09 NOTE — H&P (Signed)
Chief Complaint:  left knee pain.  Subjective: Patient is admitted for left total knee arthroplasty.  Patient is a 61 y.o. female who has been seen by Dr. Shelle Iron for ongoing hip pain.  They have been followed and found to have continued progressive pain.  They have been treated conservatively in the past including medications.  Despite conservative measures, they continue to have pain.  X-rays show that the patient has bone on bone arthritis of the left knee.  It is felt that they would benefit from undergoing surgical intervention.  Risks and benefits have been discussed with the patient and they elect to proceed with surgery.  The patient has no contraindications to the upcoming procedure such as ongoing infection or progressive neurological disease.  Allergies: Allergies  Allergen Reactions  . Amoxicillin rash  . Zolpidem Tartrate hallucinations     Medications: Wellbutrin SR ( Oral) Specific dose unknown - Active. Lisinopril ( Oral) Specific dose unknown - Active. Chlorthalidone ( Oral) Specific dose unknown - Active. Gabapentin ( Oral) Specific dose unknown - Active. Propranolol HCl CR ( Oral) Specific dose unknown - Active. BusPIRone HCl ( Oral) Specific dose unknown - Active. MetFORMIN HCl ( Oral) Specific dose unknown - Active. Verapamil HCl ( Oral) Specific dose unknown - Active. Vit B6-Vit B12-Omega 3 Acids ( Oral) Specific dose unknown - Active. ASA Buff (Mag Carb-Al Glyc) ( Oral) Specific dose unknown - Active. Motrin ( Oral) Specific dose unknown - Active. ZyPREXA ( Oral) Specific dose unknown - Active. ClonazePAM ( Oral) Specific dose unknown - Active. Viibryd ( Oral) Specific dose unknown - Active. Glucosamine Chondroitin Complx ( Oral) Specific dose unknown - Active. Flax Seeds ( Oral) Specific dose unknown - Active.  Past Medical History: Past Medical History  Diagnosis Date  . Hypertension   . Hyperlipidemia    Diabetic Depression Anxiety   . Obesity       Past Surgical History: Past Surgical History  Procedure Date  . Colon surgery   Dilation and Curettage of Uterus Foot Surgery. bilateral Breast Biopsy. right Breast Mass; Local Excision. left Resection of Stomach  Family History: Heart Disease. grandmother fathers side and grandfather fathers side   Social History: History  Substance Use Topics  . Smoking status: Former Smoker    Quit date: 10/17/1988  . Smokeless tobacco: Never Used  . Alcohol Use: No     Review of Systems A comprehensive review of systems was negative.  Physical Exam:  Vitals: Pulse:70  Respirations:10 Blood Pressure:153/84    General Appearance:    Antalgic gait,Alert, cooperative, no distress, appears stated age  Head:    Normocephalic, without obvious abnormality, atraumatic  Eyes:    PERRL, conjunctiva/corneas clear, EOM's intact, fundi    benign, both eyes  Ears:    Normal TM's and external ear canals, both ears  Nose:   Nares normal, septum midline, mucosa normal, no drainage    or sinus tenderness           Lungs:     Clear to auscultation bilaterally, respirations unlabored      Heart:    Regular rate and rhythm, S1 and S2 normal, no murmur, rub   or gallop     Abdomen:     Soft, non-tender, bowel sounds active all four quadrants,    no masses, no organomegaly        Extremities:   Varus deformity bilaterally, trace effusion, TTP medial joint line, ROM -3 to 120  Pulses:   2+ and  symmetric all extremities  Skin:   Skin color, texture, turgor normal, no rashes or lesions          Assessment/Plan: End stage arthritis, left knee  The patient is being admitted to [facility] to undergo a left total knee arthroplasty.  Surgery will be performed by Dr. Jene Every.  Risks and benefits have been discussed with the patient and they elect to proceed wth the procedure. Patient may need SNF post op, she does live alone.  She will need no beef/pork diet ordered.

## 2012-02-10 ENCOUNTER — Ambulatory Visit (HOSPITAL_COMMUNITY)
Admission: RE | Admit: 2012-02-10 | Discharge: 2012-02-10 | Disposition: A | Discharge status: Home / Self Care | Payer: BC Managed Care – PPO | Source: Ambulatory Visit | Attending: Specialist | Admitting: Specialist

## 2012-02-10 ENCOUNTER — Encounter (HOSPITAL_COMMUNITY): Payer: Self-pay | Admitting: Pharmacy Technician

## 2012-02-10 ENCOUNTER — Encounter (HOSPITAL_COMMUNITY)
Admission: RE | Admit: 2012-02-10 | Discharge: 2012-02-10 | Disposition: A | Discharge status: Home / Self Care | Payer: BC Managed Care – PPO | Source: Ambulatory Visit | Attending: Specialist | Admitting: Specialist

## 2012-02-10 ENCOUNTER — Encounter (HOSPITAL_COMMUNITY): Payer: Self-pay

## 2012-02-10 ENCOUNTER — Ambulatory Visit (HOSPITAL_COMMUNITY)
Admission: RE | Admit: 2012-02-10 | Discharge: 2012-02-10 | Disposition: A | Discharge status: Home / Self Care | Payer: BC Managed Care – PPO | Source: Ambulatory Visit | Attending: Otolaryngology | Admitting: Otolaryngology

## 2012-02-10 DIAGNOSIS — Z01812 Encounter for preprocedural laboratory examination: Secondary | ICD-10-CM | POA: Insufficient documentation

## 2012-02-10 DIAGNOSIS — I498 Other specified cardiac arrhythmias: Secondary | ICD-10-CM | POA: Insufficient documentation

## 2012-02-10 DIAGNOSIS — Z01818 Encounter for other preprocedural examination: Secondary | ICD-10-CM | POA: Insufficient documentation

## 2012-02-10 DIAGNOSIS — M1710 Unilateral primary osteoarthritis, unspecified knee: Secondary | ICD-10-CM | POA: Insufficient documentation

## 2012-02-10 DIAGNOSIS — E119 Type 2 diabetes mellitus without complications: Secondary | ICD-10-CM | POA: Insufficient documentation

## 2012-02-10 DIAGNOSIS — Z01810 Encounter for preprocedural cardiovascular examination: Secondary | ICD-10-CM | POA: Insufficient documentation

## 2012-02-10 DIAGNOSIS — I10 Essential (primary) hypertension: Secondary | ICD-10-CM | POA: Insufficient documentation

## 2012-02-10 HISTORY — DX: Gastro-esophageal reflux disease without esophagitis: K21.9

## 2012-02-10 HISTORY — DX: Unspecified osteoarthritis, unspecified site: M19.90

## 2012-02-10 HISTORY — DX: Major depressive disorder, single episode, unspecified: F32.9

## 2012-02-10 HISTORY — DX: Depression, unspecified: F32.A

## 2012-02-10 HISTORY — DX: Anxiety disorder, unspecified: F41.9

## 2012-02-10 HISTORY — DX: Calculus of kidney: N20.0

## 2012-02-10 LAB — COMPREHENSIVE METABOLIC PANEL
BUN: 17 mg/dL (ref 6–23)
CO2: 26 mEq/L (ref 19–32)
Calcium: 9.4 mg/dL (ref 8.4–10.5)
Creatinine, Ser: 1.14 mg/dL — ABNORMAL HIGH (ref 0.50–1.10)
GFR calc Af Amer: 59 mL/min — ABNORMAL LOW (ref >90)
GFR calc non Af Amer: 51 mL/min — ABNORMAL LOW (ref >90)
Glucose, Bld: 107 mg/dL — ABNORMAL HIGH (ref 70–99)

## 2012-02-10 LAB — URINALYSIS, ROUTINE W REFLEX MICROSCOPIC
Bilirubin Urine: NEGATIVE
Glucose, UA: NEGATIVE mg/dL
Hgb urine dipstick: NEGATIVE
Protein, ur: NEGATIVE mg/dL
Specific Gravity, Urine: 1.016 (ref 1.005–1.030)
Urobilinogen, UA: 0.2 mg/dL (ref 0.0–1.0)

## 2012-02-10 LAB — CBC
HCT: 38.3 % (ref 36.0–46.0)
Hemoglobin: 12.3 g/dL (ref 12.0–15.0)
MCH: 29.1 pg (ref 26.0–34.0)
MCV: 90.5 fL (ref 78.0–100.0)
RBC: 4.23 MIL/uL (ref 3.87–5.11)

## 2012-02-10 LAB — DIFFERENTIAL
Basophils Absolute: 0 10*3/uL (ref 0.0–0.1)
Basophils Relative: 0 % (ref 0–1)
Lymphocytes Relative: 28 % (ref 12–46)
Neutro Abs: 4.5 10*3/uL (ref 1.7–7.7)

## 2012-02-10 LAB — PROTIME-INR: Prothrombin Time: 13.2 seconds (ref 11.6–15.2)

## 2012-02-10 NOTE — Progress Notes (Signed)
02/10/12 1615  OBSTRUCTIVE SLEEP APNEA  Have you ever been diagnosed with sleep apnea through a sleep study? No  Do you snore loudly (loud enough to be heard through closed doors)?  0  Do you often feel tired, fatigued, or sleepy during the daytime? 0  Has anyone observed you stop breathing during your sleep? 1  Do you have, or are you being treated for high blood pressure? 1  BMI more than 35 kg/m2? 1  Age over 60 years old? 1  Neck circumference greater than 40 cm/18 inches? 0  Gender: 0  Obstructive Sleep Apnea Score 4   Score 4 or greater  Updated health history

## 2012-02-10 NOTE — Patient Instructions (Signed)
YOUR SURGERY IS SCHEDULED ON:  Thursday 5 /23  7:30 AM  REPORT TO [place] [facility] AT: 5:30 AM      PHONE # FOR SHORT STAY IS [phone]  DO NOT EAT OR DRINK ANYTHING AFTER MIDNIGHT THE NIGHT BEFORE YOUR SURGERY.  YOU MAY BRUSH YOUR TEETH, RINSE OUT YOUR MOUTH--BUT NO WATER, NO FOOD, NO CHEWING GUM, NO MINTS, NO CANDIES, NO CHEWING TOBACCO.  PLEASE TAKE THE FOLLOWING MEDICATIONS THE AM OF YOUR SURGERY WITH A FEW SIPS OF WATER:  WELLBUTRIN, BUSPAR, KLONOPIN-IF NEEDED, NEURONTIN, INDERAL    IF YOU USE INHALERS--USE YOUR INHALERS THE AM OF YOUR SURGERY AND BRING INHALERS TO THE HOSPITAL -TAKE TO SURGERY.    IF YOU ARE DIABETIC:  DO NOT TAKE ANY DIABETIC MEDICATIONS THE AM OF YOUR SURGERY.  IF YOU TAKE INSULIN IN THE EVENINGS--PLEASE ONLY TAKE 1/2 NORMAL EVENING DOSE THE NIGHT BEFORE YOUR SURGERY.  NO INSULIN THE AM OF YOUR SURGERY.  IF YOU HAVE SLEEP APNEA AND USE CPAP OR BIPAP--PLEASE BRING THE MASK --NOT THE MACHINE-NOT THE TUBING   -JUST THE MASK. DO NOT BRING VALUABLES, MONEY, CREDIT CARDS.  CONTACT LENS, DENTURES / PARTIALS, GLASSES SHOULD NOT BE WORN TO SURGERY AND IN MOST CASES-HEARING AIDS WILL NEED TO BE REMOVED.  BRING YOUR GLASSES CASE, ANY EQUIPMENT NEEDED FOR YOUR CONTACT LENS. FOR PATIENTS ADMITTED TO THE HOSPITAL--CHECK OUT TIME THE DAY OF DISCHARGE IS 11:00 AM.  ALL INPATIENT ROOMS ARE PRIVATE - WITH BATHROOM, TELEPHONE, TELEVISION AND WIFI INTERNET. IF YOU ARE BEING DISCHARGED THE SAME DAY OF YOUR SURGERY--YOU CAN NOT DRIVE YOURSELF HOME--AND SHOULD NOT GO HOME ALONE BY TAXI OR BUS.  NO DRIVING OR OPERATING MACHINERY FOR 24 HOURS FOLLOWING ANESTHESIA / PAIN MEDICATIONS.                            SPECIAL INSTRUCTIONS:  CHLORHEXIDINE SOAP SHOWER (other brand names are Betasept and Hibiclens ) PLEASE SHOWER WITH CHLORHEXIDINE THE NIGHT BEFORE YOUR SURGERY AND THE AM OF YOUR SURGERY. DO NOT USE CHLORHEXIDINE ON YOUR FACE OR PRIVATE AREAS--YOU MAY USE YOUR NORMAL SOAP  THOSE AREAS AND YOUR NORMAL SHAMPOO.  WOMEN SHOULD AVOID SHAVING UNDER ARMS AND SHAVING LEGS 48 HOURS BEFORE USING CHLORHEXIDINE TO AVOID SKIN IRRITATION.  DO NOT USE IF ALLERGIC TO CHLORHEXIDINE.  PLEASE READ OVER ANY  FACT SHEETS THAT YOU WERE GIVEN: MRSA INFORMATION, BLOOD TRANSFUSION INFORMATION, INCENTIVE SPIROMETER INFORMATION.

## 2012-02-16 ENCOUNTER — Inpatient Hospital Stay (HOSPITAL_COMMUNITY): Payer: BC Managed Care – PPO

## 2012-02-16 ENCOUNTER — Ambulatory Visit (HOSPITAL_COMMUNITY): Payer: BC Managed Care – PPO | Admitting: Anesthesiology

## 2012-02-16 ENCOUNTER — Encounter (HOSPITAL_COMMUNITY): Payer: Self-pay | Admitting: *Deleted

## 2012-02-16 ENCOUNTER — Other Ambulatory Visit: Payer: Self-pay

## 2012-02-16 ENCOUNTER — Encounter (HOSPITAL_COMMUNITY): Payer: Self-pay | Admitting: Anesthesiology

## 2012-02-16 ENCOUNTER — Inpatient Hospital Stay (HOSPITAL_COMMUNITY)
Admission: RE | Admit: 2012-02-16 | Discharge: 2012-02-23 | DRG: 209 | Disposition: A | Discharge status: Home Health | Payer: BC Managed Care – PPO | Source: Ambulatory Visit | Attending: Specialist | Admitting: Specialist

## 2012-02-16 ENCOUNTER — Encounter (HOSPITAL_COMMUNITY)
Admission: RE | Disposition: A | Discharge status: Home Health | Payer: Self-pay | Source: Ambulatory Visit | Attending: Specialist

## 2012-02-16 ENCOUNTER — Ambulatory Visit (HOSPITAL_COMMUNITY): Payer: BC Managed Care – PPO

## 2012-02-16 DIAGNOSIS — I10 Essential (primary) hypertension: Secondary | ICD-10-CM

## 2012-02-16 DIAGNOSIS — E669 Obesity, unspecified: Secondary | ICD-10-CM | POA: Diagnosis present

## 2012-02-16 DIAGNOSIS — D62 Acute posthemorrhagic anemia: Secondary | ICD-10-CM | POA: Diagnosis not present

## 2012-02-16 DIAGNOSIS — M1710 Unilateral primary osteoarthritis, unspecified knee: Principal | ICD-10-CM | POA: Diagnosis present

## 2012-02-16 DIAGNOSIS — F3289 Other specified depressive episodes: Secondary | ICD-10-CM | POA: Diagnosis present

## 2012-02-16 DIAGNOSIS — R002 Palpitations: Secondary | ICD-10-CM | POA: Diagnosis present

## 2012-02-16 DIAGNOSIS — E119 Type 2 diabetes mellitus without complications: Secondary | ICD-10-CM | POA: Diagnosis present

## 2012-02-16 DIAGNOSIS — F329 Major depressive disorder, single episode, unspecified: Secondary | ICD-10-CM | POA: Diagnosis present

## 2012-02-16 DIAGNOSIS — E1165 Type 2 diabetes mellitus with hyperglycemia: Secondary | ICD-10-CM

## 2012-02-16 DIAGNOSIS — Z96652 Presence of left artificial knee joint: Secondary | ICD-10-CM

## 2012-02-16 DIAGNOSIS — Z6839 Body mass index (BMI) 39.0-39.9, adult: Secondary | ICD-10-CM

## 2012-02-16 HISTORY — PX: TOTAL KNEE ARTHROPLASTY: SHX125

## 2012-02-16 LAB — GLUCOSE, CAPILLARY
Glucose-Capillary: 120 mg/dL — ABNORMAL HIGH (ref 70–99)
Glucose-Capillary: 152 mg/dL — ABNORMAL HIGH (ref 70–99)
Glucose-Capillary: 125 mg/dL — ABNORMAL HIGH (ref 70–99)
Glucose-Capillary: 129 mg/dL — ABNORMAL HIGH (ref 70–99)

## 2012-02-16 LAB — COMPREHENSIVE METABOLIC PANEL
CO2: 24 mEq/L (ref 19–32)
Calcium: 8.2 mg/dL — ABNORMAL LOW (ref 8.4–10.5)
Chloride: 100 mEq/L (ref 96–112)
Creatinine, Ser: 0.99 mg/dL (ref 0.50–1.10)
GFR calc Af Amer: 70 mL/min — ABNORMAL LOW (ref >90)
GFR calc non Af Amer: 61 mL/min — ABNORMAL LOW (ref >90)
Glucose, Bld: 128 mg/dL — ABNORMAL HIGH (ref 70–99)
Total Bilirubin: 0.3 mg/dL (ref 0.3–1.2)

## 2012-02-16 LAB — CBC
Hemoglobin: 11.3 g/dL — ABNORMAL LOW (ref 12.0–15.0)
MCH: 29.3 pg (ref 26.0–34.0)
MCHC: 32.2 g/dL (ref 30.0–36.0)
Platelets: 313 10*3/uL (ref 150–400)
RBC: 3.86 MIL/uL — ABNORMAL LOW (ref 3.87–5.11)

## 2012-02-16 LAB — CARDIAC PANEL(CRET KIN+CKTOT+MB+TROPI)
Relative Index: 1.4 (ref 0.0–2.5)
Total CK: 106 U/L (ref 7–177)

## 2012-02-16 LAB — TYPE AND SCREEN
ABO/RH(D): O NEG
Antibody Screen: NEGATIVE

## 2012-02-16 LAB — PRO B NATRIURETIC PEPTIDE: Pro B Natriuretic peptide (BNP): 531.3 pg/mL — ABNORMAL HIGH (ref 0–125)

## 2012-02-16 SURGERY — ARTHROPLASTY, KNEE, TOTAL
Anesthesia: General | Site: Knee | Laterality: Left | Wound class: Clean

## 2012-02-16 MED ORDER — CEFAZOLIN SODIUM 1-5 GM-% IV SOLN
INTRAVENOUS | Status: DC | PRN
  Administered 2012-02-16: 2 g via INTRAVENOUS

## 2012-02-16 MED ORDER — INSULIN ASPART 100 UNIT/ML ~~LOC~~ SOLN
0.0000 [IU] | Freq: Three times a day (TID) | SUBCUTANEOUS | Status: DC
  Administered 2012-02-17 (×2): 2 [IU] via SUBCUTANEOUS

## 2012-02-16 MED ORDER — HYDRALAZINE HCL 20 MG/ML IJ SOLN
10.0000 mg | Freq: Four times a day (QID) | INTRAMUSCULAR | Status: DC | PRN
  Administered 2012-02-16: 10 mg via INTRAVENOUS
  Filled 2012-02-16 (×2): qty 1

## 2012-02-16 MED ORDER — HYDROMORPHONE HCL PF 1 MG/ML IJ SOLN
INTRAMUSCULAR | Status: DC | PRN
  Administered 2012-02-16 (×3): 0.5 mg via INTRAVENOUS

## 2012-02-16 MED ORDER — HYDRALAZINE HCL 20 MG/ML IJ SOLN
INTRAMUSCULAR | Status: DC | PRN
  Administered 2012-02-16: 2 mg via INTRAVENOUS
  Administered 2012-02-16: 4 mg via INTRAVENOUS

## 2012-02-16 MED ORDER — ACETAMINOPHEN 325 MG PO TABS: 650.0000 mg | ORAL_TABLET | Freq: Four times a day (QID) | ORAL | Status: DC | PRN

## 2012-02-16 MED ORDER — PHENOL 1.4 % MT LIQD: 1.0000 | OROMUCOSAL | Status: DC | PRN

## 2012-02-16 MED ORDER — POLYETHYLENE GLYCOL 3350 17 G PO PACK: 17.0000 g | PACK | Freq: Every day | ORAL | Status: DC | PRN

## 2012-02-16 MED ORDER — METHOCARBAMOL 500 MG PO TABS
500.0000 mg | ORAL_TABLET | Freq: Four times a day (QID) | ORAL | Status: DC | PRN
  Administered 2012-02-17 – 2012-02-22 (×8): 500 mg via ORAL
  Filled 2012-02-16 (×10): qty 1

## 2012-02-16 MED ORDER — LACTATED RINGERS IV SOLN
INTRAVENOUS | Status: DC | PRN
  Administered 2012-02-16 (×3): via INTRAVENOUS

## 2012-02-16 MED ORDER — BUPROPION HCL ER (XL) 300 MG PO TB24
450.0000 mg | ORAL_TABLET | Freq: Every day | ORAL | Status: DC
  Administered 2012-02-17 – 2012-02-23 (×7): 450 mg via ORAL
  Filled 2012-02-16 (×7): qty 1

## 2012-02-16 MED ORDER — ACETAMINOPHEN 10 MG/ML IV SOLN
INTRAVENOUS | Status: AC
  Filled 2012-02-16: qty 100

## 2012-02-16 MED ORDER — BUPROPION HCL ER (SR) 150 MG PO TB12: 450.0000 mg | ORAL_TABLET | Freq: Every day | ORAL | Status: DC

## 2012-02-16 MED ORDER — ACETAMINOPHEN 650 MG RE SUPP: 650.0000 mg | Freq: Four times a day (QID) | RECTAL | Status: DC | PRN

## 2012-02-16 MED ORDER — SODIUM CHLORIDE 0.9 % IR SOLN
Status: DC | PRN
  Administered 2012-02-16: 10:00:00

## 2012-02-16 MED ORDER — CHLORTHALIDONE 25 MG PO TABS
25.0000 mg | ORAL_TABLET | Freq: Every day | ORAL | Status: DC
  Administered 2012-02-17 – 2012-02-23 (×7): 25 mg via ORAL
  Filled 2012-02-16 (×9): qty 1

## 2012-02-16 MED ORDER — BUPIVACAINE-EPINEPHRINE 0.5% -1:200000 IJ SOLN
INTRAMUSCULAR | Status: AC
  Filled 2012-02-16: qty 1

## 2012-02-16 MED ORDER — INSULIN ASPART 100 UNIT/ML ~~LOC~~ SOLN: 0.0000 [IU] | Freq: Three times a day (TID) | SUBCUTANEOUS | Status: DC

## 2012-02-16 MED ORDER — ONDANSETRON HCL 4 MG PO TABS: 4.0000 mg | ORAL_TABLET | Freq: Four times a day (QID) | ORAL | Status: DC | PRN

## 2012-02-16 MED ORDER — VERAPAMIL HCL ER 240 MG PO TBCR
240.0000 mg | EXTENDED_RELEASE_TABLET | Freq: Every day | ORAL | Status: DC
  Administered 2012-02-16 – 2012-02-22 (×7): 240 mg via ORAL
  Filled 2012-02-16 (×8): qty 1

## 2012-02-16 MED ORDER — GABAPENTIN 300 MG PO CAPS
300.0000 mg | ORAL_CAPSULE | Freq: Three times a day (TID) | ORAL | Status: DC
  Administered 2012-02-16 – 2012-02-23 (×21): 300 mg via ORAL
  Filled 2012-02-16 (×23): qty 1

## 2012-02-16 MED ORDER — HYDROCODONE-ACETAMINOPHEN 7.5-325 MG PO TABS
1.0000 | ORAL_TABLET | ORAL | Status: DC
  Administered 2012-02-17 (×2): 1 via ORAL
  Administered 2012-02-17 (×3): 2 via ORAL
  Administered 2012-02-17 – 2012-02-21 (×14): 1 via ORAL
  Administered 2012-02-21: 2 via ORAL
  Administered 2012-02-21 (×2): 1 via ORAL
  Administered 2012-02-22: 2 via ORAL
  Administered 2012-02-22 – 2012-02-23 (×4): 1 via ORAL
  Filled 2012-02-16: qty 2
  Filled 2012-02-16 (×5): qty 1
  Filled 2012-02-16: qty 2
  Filled 2012-02-16 (×8): qty 1
  Filled 2012-02-16 (×4): qty 2
  Filled 2012-02-16 (×2): qty 1
  Filled 2012-02-16: qty 2
  Filled 2012-02-16: qty 1
  Filled 2012-02-16 (×2): qty 2
  Filled 2012-02-16 (×2): qty 1

## 2012-02-16 MED ORDER — HYDROMORPHONE HCL PF 1 MG/ML IJ SOLN
0.2500 mg | INTRAMUSCULAR | Status: DC | PRN
  Administered 2012-02-16 (×2): 0.5 mg via INTRAVENOUS

## 2012-02-16 MED ORDER — BUPIVACAINE-EPINEPHRINE 0.5% -1:200000 IJ SOLN
INTRAMUSCULAR | Status: DC | PRN
  Administered 2012-02-16: 10 mL

## 2012-02-16 MED ORDER — HYDROMORPHONE HCL PF 1 MG/ML IJ SOLN
INTRAMUSCULAR | Status: AC
  Filled 2012-02-16: qty 1

## 2012-02-16 MED ORDER — BUSPIRONE HCL 15 MG PO TABS
30.0000 mg | ORAL_TABLET | Freq: Two times a day (BID) | ORAL | Status: DC
  Administered 2012-02-16 – 2012-02-23 (×14): 30 mg via ORAL
  Filled 2012-02-16 (×16): qty 2

## 2012-02-16 MED ORDER — ROCURONIUM BROMIDE 100 MG/10ML IV SOLN
INTRAVENOUS | Status: DC | PRN
  Administered 2012-02-16: 50 mg via INTRAVENOUS

## 2012-02-16 MED ORDER — NEOSTIGMINE METHYLSULFATE 1 MG/ML IJ SOLN
INTRAMUSCULAR | Status: DC | PRN
  Administered 2012-02-16: 3 mg via INTRAVENOUS

## 2012-02-16 MED ORDER — GLYCOPYRROLATE 0.2 MG/ML IJ SOLN
INTRAMUSCULAR | Status: DC | PRN
  Administered 2012-02-16: .4 mg via INTRAVENOUS

## 2012-02-16 MED ORDER — POTASSIUM CHLORIDE IN NACL 20-0.45 MEQ/L-% IV SOLN
INTRAVENOUS | Status: DC
  Administered 2012-02-16: 100 mL/h via INTRAVENOUS
  Filled 2012-02-16 (×2): qty 1000

## 2012-02-16 MED ORDER — LABETALOL HCL 5 MG/ML IV SOLN
INTRAVENOUS | Status: DC | PRN
  Administered 2012-02-16 (×5): 2.5 mg via INTRAVENOUS

## 2012-02-16 MED ORDER — PROPRANOLOL HCL 10 MG PO TABS
10.0000 mg | ORAL_TABLET | Freq: Two times a day (BID) | ORAL | Status: DC
  Administered 2012-02-16 – 2012-02-23 (×13): 10 mg via ORAL
  Filled 2012-02-16 (×15): qty 1

## 2012-02-16 MED ORDER — MENTHOL 3 MG MT LOZG: 1.0000 | LOZENGE | OROMUCOSAL | Status: DC | PRN

## 2012-02-16 MED ORDER — SODIUM CHLORIDE 0.9 % IR SOLN
Status: DC | PRN
  Administered 2012-02-16: 3000 mL

## 2012-02-16 MED ORDER — CLONAZEPAM 0.5 MG PO TABS: 0.5000 mg | ORAL_TABLET | Freq: Two times a day (BID) | ORAL | Status: DC | PRN

## 2012-02-16 MED ORDER — METOCLOPRAMIDE HCL 5 MG/ML IJ SOLN: 5.0000 mg | Freq: Three times a day (TID) | INTRAMUSCULAR | Status: DC | PRN

## 2012-02-16 MED ORDER — CEFAZOLIN SODIUM-DEXTROSE 2-3 GM-% IV SOLR
INTRAVENOUS | Status: AC
  Filled 2012-02-16: qty 50

## 2012-02-16 MED ORDER — ALBUTEROL SULFATE (5 MG/ML) 0.5% IN NEBU
2.5000 mg | INHALATION_SOLUTION | RESPIRATORY_TRACT | Status: DC | PRN
Start: 2012-02-16 — End: 2012-02-23

## 2012-02-16 MED ORDER — SODIUM CHLORIDE 0.45 % IV SOLN
INTRAVENOUS | Status: DC
  Filled 2012-02-16 (×9): qty 1000

## 2012-02-16 MED ORDER — CEFAZOLIN SODIUM-DEXTROSE 2-3 GM-% IV SOLR
2.0000 g | Freq: Four times a day (QID) | INTRAVENOUS | Status: AC
  Administered 2012-02-16 – 2012-02-17 (×3): 2 g via INTRAVENOUS
  Filled 2012-02-16 (×3): qty 50

## 2012-02-16 MED ORDER — MIDAZOLAM HCL 5 MG/5ML IJ SOLN
INTRAMUSCULAR | Status: DC | PRN
  Administered 2012-02-16: 2 mg via INTRAVENOUS

## 2012-02-16 MED ORDER — VILAZODONE HCL 40 MG PO TABS
40.0000 mg | ORAL_TABLET | Freq: Every day | ORAL | Status: DC
  Administered 2012-02-16 – 2012-02-22 (×7): 40 mg via ORAL
  Filled 2012-02-16 (×9): qty 1

## 2012-02-16 MED ORDER — METFORMIN HCL 500 MG PO TABS
500.0000 mg | ORAL_TABLET | Freq: Two times a day (BID) | ORAL | Status: DC
  Administered 2012-02-16: 500 mg via ORAL
  Filled 2012-02-16 (×2): qty 1

## 2012-02-16 MED ORDER — HYDROMORPHONE HCL PF 1 MG/ML IJ SOLN: 0.5000 mg | INTRAMUSCULAR | Status: DC | PRN

## 2012-02-16 MED ORDER — HYDROCODONE-ACETAMINOPHEN 7.5-325 MG PO TABS
1.0000 | ORAL_TABLET | ORAL | Status: DC
  Administered 2012-02-16 (×2): 2 via ORAL
  Filled 2012-02-16 (×2): qty 2

## 2012-02-16 MED ORDER — RIVAROXABAN 10 MG PO TABS
10.0000 mg | ORAL_TABLET | Freq: Every day | ORAL | Status: DC
  Administered 2012-02-17 – 2012-02-23 (×7): 10 mg via ORAL
  Filled 2012-02-16 (×9): qty 1

## 2012-02-16 MED ORDER — PROPOFOL 10 MG/ML IV BOLUS
INTRAVENOUS | Status: DC | PRN
  Administered 2012-02-16: 180 mg via INTRAVENOUS

## 2012-02-16 MED ORDER — METOCLOPRAMIDE HCL 5 MG/ML IJ SOLN
INTRAMUSCULAR | Status: DC | PRN
  Administered 2012-02-16: 10 mg via INTRAVENOUS

## 2012-02-16 MED ORDER — METHOCARBAMOL 100 MG/ML IJ SOLN
500.0000 mg | Freq: Four times a day (QID) | INTRAVENOUS | Status: DC | PRN
  Administered 2012-02-16: 500 mg via INTRAVENOUS
  Filled 2012-02-16 (×3): qty 5

## 2012-02-16 MED ORDER — LIDOCAINE HCL (CARDIAC) 20 MG/ML IV SOLN
INTRAVENOUS | Status: DC | PRN
  Administered 2012-02-16: 50 mg via INTRAVENOUS

## 2012-02-16 MED ORDER — ONDANSETRON HCL 4 MG/2ML IJ SOLN: 4.0000 mg | Freq: Four times a day (QID) | INTRAMUSCULAR | Status: DC | PRN

## 2012-02-16 MED ORDER — 0.9 % SODIUM CHLORIDE (POUR BTL) OPTIME
TOPICAL | Status: DC | PRN
  Administered 2012-02-16: 1000 mL

## 2012-02-16 MED ORDER — ONDANSETRON HCL 4 MG/2ML IJ SOLN
INTRAMUSCULAR | Status: DC | PRN
  Administered 2012-02-16: 4 mg via INTRAVENOUS

## 2012-02-16 MED ORDER — LISINOPRIL 40 MG PO TABS
40.0000 mg | ORAL_TABLET | Freq: Every day | ORAL | Status: DC
  Administered 2012-02-17 – 2012-02-23 (×7): 40 mg via ORAL
  Filled 2012-02-16 (×9): qty 1

## 2012-02-16 MED ORDER — FENTANYL CITRATE 0.05 MG/ML IJ SOLN
INTRAMUSCULAR | Status: DC | PRN
  Administered 2012-02-16 (×2): 50 ug via INTRAVENOUS
  Administered 2012-02-16: 100 ug via INTRAVENOUS
  Administered 2012-02-16: 50 ug via INTRAVENOUS

## 2012-02-16 MED ORDER — OLANZAPINE 10 MG PO TABS
30.0000 mg | ORAL_TABLET | Freq: Every day | ORAL | Status: DC
  Administered 2012-02-16 – 2012-02-22 (×7): 30 mg via ORAL
  Filled 2012-02-16 (×8): qty 3

## 2012-02-16 MED ORDER — METOCLOPRAMIDE HCL 10 MG PO TABS: 5.0000 mg | ORAL_TABLET | Freq: Three times a day (TID) | ORAL | Status: DC | PRN

## 2012-02-16 MED ORDER — BISACODYL 10 MG RE SUPP: 10.0000 mg | Freq: Every day | RECTAL | Status: DC | PRN

## 2012-02-16 MED ORDER — DIPHENHYDRAMINE HCL 12.5 MG/5ML PO ELIX: 12.5000 mg | ORAL_SOLUTION | ORAL | Status: DC | PRN

## 2012-02-16 MED ORDER — DOCUSATE SODIUM 100 MG PO CAPS
100.0000 mg | ORAL_CAPSULE | Freq: Two times a day (BID) | ORAL | Status: DC
  Administered 2012-02-16 – 2012-02-23 (×14): 100 mg via ORAL

## 2012-02-16 SURGICAL SUPPLY — 61 items
BAG SPEC THK2 15X12 ZIP CLS (MISCELLANEOUS) ×1
BAG ZIPLOCK 12X15 (MISCELLANEOUS) ×2 IMPLANT
BANDAGE ELASTIC 4 VELCRO ST LF (GAUZE/BANDAGES/DRESSINGS) ×2 IMPLANT
BANDAGE ELASTIC 6 VELCRO ST LF (GAUZE/BANDAGES/DRESSINGS) ×2 IMPLANT
BANDAGE ESMARK 6X9 LF (GAUZE/BANDAGES/DRESSINGS) ×1 IMPLANT
BLADE SAG 18X100X1.27 (BLADE) ×2 IMPLANT
BLADE SAW SGTL 13.0X1.19X90.0M (BLADE) ×2 IMPLANT
BNDG CMPR 9X6 STRL LF SNTH (GAUZE/BANDAGES/DRESSINGS) ×1
BNDG ESMARK 6X9 LF (GAUZE/BANDAGES/DRESSINGS) ×2
CEMENT HV SMART SET (Cement) ×4 IMPLANT
CHLORAPREP W/TINT 26ML (MISCELLANEOUS) IMPLANT
CLOTH BEACON ORANGE TIMEOUT ST (SAFETY) ×2 IMPLANT
CUFF TOURN SGL QUICK 34 (TOURNIQUET CUFF) ×2
CUFF TRNQT CYL 34X4X40X1 (TOURNIQUET CUFF) ×1 IMPLANT
DECANTER SPIKE VIAL GLASS SM (MISCELLANEOUS) ×2 IMPLANT
DRAPE LG THREE QUARTER DISP (DRAPES) ×2 IMPLANT
DRAPE ORTHO SPLIT 77X108 STRL (DRAPES) ×4
DRAPE POUCH INSTRU U-SHP 10X18 (DRAPES) ×2 IMPLANT
DRAPE SURG ORHT 6 SPLT 77X108 (DRAPES) ×2 IMPLANT
DRAPE U-SHAPE 47X51 STRL (DRAPES) ×2 IMPLANT
DRSG ADAPTIC 3X8 NADH LF (GAUZE/BANDAGES/DRESSINGS) ×2 IMPLANT
DRSG PAD ABDOMINAL 8X10 ST (GAUZE/BANDAGES/DRESSINGS) ×2 IMPLANT
DURAPREP 26ML APPLICATOR (WOUND CARE) ×2 IMPLANT
ELECT REM PT RETURN 9FT ADLT (ELECTROSURGICAL) ×2
ELECTRODE REM PT RTRN 9FT ADLT (ELECTROSURGICAL) ×1 IMPLANT
EVACUATOR 1/8 PVC DRAIN (DRAIN) ×2 IMPLANT
FACESHIELD LNG OPTICON STERILE (SAFETY) ×10 IMPLANT
GLOVE BIO SURGEON STRL SZ 6.5 (GLOVE) ×2 IMPLANT
GLOVE BIOGEL PI IND STRL 8 (GLOVE) ×1 IMPLANT
GLOVE BIOGEL PI INDICATOR 8 (GLOVE) ×1
GLOVE ECLIPSE 6.5 STRL STRAW (GLOVE) ×2 IMPLANT
GLOVE INDICATOR 6.5 STRL GRN (GLOVE) ×2 IMPLANT
GLOVE SURG SS PI 8.0 STRL IVOR (GLOVE) ×4 IMPLANT
GOWN PREVENTION PLUS LG XLONG (DISPOSABLE) ×2 IMPLANT
GOWN PREVENTION PLUS XLARGE (GOWN DISPOSABLE) ×2 IMPLANT
GOWN STRL REIN XL XLG (GOWN DISPOSABLE) ×2 IMPLANT
HANDPIECE INTERPULSE COAX TIP (DISPOSABLE) ×2
IMMOBILIZER KNEE 20 (SOFTGOODS) ×2
IMMOBILIZER KNEE 20 THIGH 36 (SOFTGOODS) ×1 IMPLANT
KIT BASIN OR (CUSTOM PROCEDURE TRAY) ×2 IMPLANT
MANIFOLD NEPTUNE II (INSTRUMENTS) ×2 IMPLANT
NS IRRIG 1000ML POUR BTL (IV SOLUTION) ×2 IMPLANT
PACK TOTAL JOINT (CUSTOM PROCEDURE TRAY) ×2 IMPLANT
PADDING CAST COTTON 6X4 STRL (CAST SUPPLIES) ×2 IMPLANT
POSITIONER SURGICAL ARM (MISCELLANEOUS) ×2 IMPLANT
SET HNDPC FAN SPRY TIP SCT (DISPOSABLE) ×1 IMPLANT
SPONGE GAUZE 4X4 12PLY (GAUZE/BANDAGES/DRESSINGS) ×1 IMPLANT
SPONGE SURGIFOAM ABS GEL 100 (HEMOSTASIS) ×2 IMPLANT
STAPLER VISISTAT (STAPLE) ×2 IMPLANT
SUCTION FRAZIER 12FR DISP (SUCTIONS) ×2 IMPLANT
SUT BONE WAX W31G (SUTURE) ×2 IMPLANT
SUT VIC AB 1 CT1 27 (SUTURE) ×8
SUT VIC AB 1 CT1 27XBRD ANTBC (SUTURE) ×4 IMPLANT
SUT VIC AB 2-0 CT1 27 (SUTURE) ×6
SUT VIC AB 2-0 CT1 TAPERPNT 27 (SUTURE) ×3 IMPLANT
SUT VLOC 180 0 24IN GS25 (SUTURE) ×2 IMPLANT
SYR 30ML LL (SYRINGE) ×2 IMPLANT
TOWER CARTRIDGE SMART MIX (DISPOSABLE) ×2 IMPLANT
TRAY FOLEY CATH 14FRSI W/METER (CATHETERS) ×2 IMPLANT
WATER STERILE IRR 1500ML POUR (IV SOLUTION) ×2 IMPLANT
WRAP KNEE MAXI GEL POST OP (GAUZE/BANDAGES/DRESSINGS) ×2 IMPLANT

## 2012-02-16 NOTE — Consult Note (Signed)
[name] MRN: [id number] DOB/AGE: June 14, 1951 61 y.o. Primary Care Physician:No primary provider on file. Admit date: 02/16/2012 Chief Complaint: HTN HPI: 60 YR f S/P TOTAL KNEE ARTHROPLASTY (Left), TODAY is being seen for uncontrolled BP after surgery. She has otherwise been stable prior to surgery. 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 She had a negative stress test 2 years ago.   Past Medical History  Diagnosis Date  . Hypertension   . Hyperlipidemia   . Obesity   . Diabetes mellitus     ORAL MEDICATION  . GERD (gastroesophageal reflux disease)     RARE- OTC ANTACID IF NEEDED--PAST HX OF ULCER  . Neuromuscular disorder     NUMBNESS/TINGLING, JABBING PAIN IN FEET--NEURONTIN HELPING  . 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  PT ALSO HAS LOWER BACK PAIN AT TIMES  . Anxiety   . Depression   . Kidney stones     PT PASSED STONES--NO KNOWN STONES AT PRESENT TIMES  . Urinary incontinence   . Dysrhythmia     PT STATES IRREGULAR HB FELT TO BE RELATED TO STRESS--NEGATIVE CARDIAC WORK UP IN 2011 WITH DR. Shirlee Latch - [facility] --NUCLEAR STRESS TEST, ECHO AND HOLTER  MONITOR RESULTS IN EPIC   . Sleep apnea 02/10/2012    STOP BANG SCORE 4    Past Surgical History  Procedure Date  . Colon surgery   . Surgery for bowel obstruction and removal of part of stomach  in 2000   . Bilateral bunionectomy 1979     Prior to Admission medications   Medication Sig Start Date End Date Taking? Authorizing Provider  buPROPion (WELLBUTRIN XL) 150 MG 24 hr tablet Take 450 mg by mouth daily.   Yes Historical Provider, MD  busPIRone (BUSPAR) 15 MG tablet Take 30 mg by mouth 2 (two) times daily.    Yes Historical Provider, MD  chlorthalidone (HYGROTON) 25 MG tablet Take 25 mg by mouth daily with breakfast. 01/26/11  Yes Laurey Morale, MD  clonazePAM (KLONOPIN) 0.5 MG tablet Take 0.5 mg by mouth 2  (two) times daily as needed. For anxiety    Yes Historical Provider, MD  gabapentin (NEURONTIN) 300 MG capsule Take 300 mg by mouth 3 (three) times daily.    Yes Historical Provider, MD  lisinopril (PRINIVIL,ZESTRIL) 40 MG tablet Take 40 mg by mouth daily with breakfast. 01/26/11  Yes Laurey Morale, MD  metFORMIN (GLUCOPHAGE) 500 MG tablet Take 500 mg by mouth 2 (two) times daily with a meal.   Yes Historical Provider, MD  OLANZapine (ZYPREXA) 20 MG tablet Take 30 mg by mouth at bedtime.    Yes Historical Provider, MD  propranolol (INDERAL) 10 MG tablet Take 10 mg by mouth 2 (two) times daily. For tremor    Yes Historical Provider, MD  verapamil (COVERA HS) 240 MG (CO) 24 hr tablet Take by mouth at bedtime.    Yes Historical Provider, MD  Vilazodone HCl (VIIBRYD) 40 MG TABS Take 40 mg by mouth daily with supper.    Yes Historical Provider, MD  aspirin 81 MG tablet Take 81 mg by mouth daily with breakfast.     Historical Provider, MD  Calcium Carbonate-Vitamin D (CALCIUM 500 + D PO) Take 500 mg by mouth daily with breakfast.    Historical Provider, MD  cholecalciferol (VITAMIN D) 1000 UNITS tablet Take 1,000 Units by mouth daily with  breakfast.    Historical Provider, MD  ibuprofen (ADVIL,MOTRIN) 200 MG tablet Take 800 mg by mouth every 6 (six) hours as needed. For pain    Historical Provider, MD  Multiple Vitamin (MULITIVITAMIN WITH MINERALS) TABS Take 1 tablet by mouth daily with breakfast.    Historical Provider, MD    Allergies:  Allergies  Allergen Reactions  . Zolpidem Tartrate     Reaction-hallucinations  . Amoxicillin Rash    History reviewed. No pertinent family history.  Social History:  reports that she quit smoking about 23 years ago. Her smoking use included Cigarettes. She has a 40 pack-year smoking history. She has never used smokeless tobacco. She reports that she does not drink alcohol or use illicit drugs.         UJW:JXBJYN of Systems  A comprehensive review of  systems was negative   PHYSICAL EXAM: Blood pressure 166/88, pulse 97, temperature 98.3 F (36.8 C), temperature source Oral, resp. rate 14, height 5\' 5"  (1.651 m), weight 107.502 kg (237 lb), SpO2 98.00%. General Appearance:  Antalgic gait,Alert, cooperative, no distress, appears stated age  Head:  Normocephalic, without obvious abnormality, atraumatic  Eyes: PERRL, conjunctiva/corneas clear, EOM's intact, fundi  benign, both eyes  Ears:  Normal TM's and external ear canals, both ears  Nose:  Nares normal, septum midline, mucosa normal, no drainage or sinus tenderness  Lungs:  Clear to auscultation bilaterally, respirations unlabored  Heart:  Regular rate and rhythm, S1 and S2 normal, no murmur, rub or gallop  Abdomen: Soft, non-tender, bowel sounds active all four quadrants,  no masses, no organomegaly  Extremities:  Varus deformity bilaterally, trace effusion, TTP medial joint line, ROM -3 to 120  Pulses:  2+ and symmetric all extremities  Skin:  Skin color, texture, turgor normal, no rashes or lesions    Recent Results (from the past 240 hour(s))  SURGICAL PCR SCREEN     Status: Normal   Collection Time   02/10/12 11:13 AM      Component Value Range Status Comment   MRSA, PCR NEGATIVE  NEGATIVE  Final    Staphylococcus aureus NEGATIVE  NEGATIVE  Final      Results for orders placed during the hospital encounter of 02/16/12 (from the past 48 hour(s))  GLUCOSE, CAPILLARY     Status: Abnormal   Collection Time   02/16/12  5:52 AM      Component Value Range Comment   Glucose-Capillary 120 (*) 70 - 99 (mg/dL)   GLUCOSE, CAPILLARY     Status: Abnormal   Collection Time   02/16/12 10:38 AM      Component Value Range Comment   Glucose-Capillary 152 (*) 70 - 99 (mg/dL)    Comment 1 Documented in Chart      Comment 2 Notify RN     GLUCOSE, CAPILLARY     Status: Abnormal   Collection Time   02/16/12  4:31 PM      Component Value Range Comment   Glucose-Capillary 125 (*)  70 - 99 (mg/dL)   COMPREHENSIVE METABOLIC PANEL     Status: Abnormal   Collection Time   02/16/12  6:29 PM      Component Value Range Comment   Sodium 134 (*) 135 - 145 (mEq/L)    Potassium 3.5  3.5 - 5.1 (mEq/L)    Chloride 100  96 - 112 (mEq/L)    CO2 24  19 - 32 (mEq/L)    Glucose, Bld 128 (*) 70 - 99 (  mg/dL)    BUN 13  6 - 23 (mg/dL)    Creatinine, Ser 4.54  0.50 - 1.10 (mg/dL)    Calcium 8.2 (*) 8.4 - 10.5 (mg/dL)    Total Protein 6.5  6.0 - 8.3 (g/dL)    Albumin 3.1 (*) 3.5 - 5.2 (g/dL)    AST 19  0 - 37 (U/L)    ALT 22  0 - 35 (U/L)    Alkaline Phosphatase 108  39 - 117 (U/L)    Total Bilirubin 0.3  0.3 - 1.2 (mg/dL)    GFR calc non Af Amer 61 (*) >90 (mL/min)    GFR calc Af Amer 70 (*) >90 (mL/min)   CBC     Status: Abnormal   Collection Time   02/16/12  6:29 PM      Component Value Range Comment   WBC 13.2 (*) 4.0 - 10.5 (K/uL)    RBC 3.86 (*) 3.87 - 5.11 (MIL/uL)    Hemoglobin 11.3 (*) 12.0 - 15.0 (g/dL)    HCT 09.8 (*) 11.9 - 46.0 (%)    MCV 90.9  78.0 - 100.0 (fL)    MCH 29.3  26.0 - 34.0 (pg)    MCHC 32.2  30.0 - 36.0 (g/dL)    RDW 14.7  82.9 - 56.2 (%)    Platelets 313  150 - 400 (K/uL)   PRO B NATRIURETIC PEPTIDE     Status: Abnormal   Collection Time   02/16/12  6:29 PM      Component Value Range Comment   Pro B Natriuretic peptide (BNP) 531.3 (*) 0 - 125 (pg/mL)   CARDIAC PANEL(CRET KIN+CKTOT+MB+TROPI)     Status: Normal   Collection Time   02/16/12  6:29 PM      Component Value Range Comment   Total CK 106  7 - 177 (U/L)    CK, MB 1.5  0.3 - 4.0 (ng/mL)    Troponin I <0.30  <0.30 (ng/mL)    Relative Index 1.4  0.0 - 2.5    GLUCOSE, CAPILLARY     Status: Abnormal   Collection Time   02/16/12  9:39 PM      Component Value Range Comment   Glucose-Capillary 129 (*) 70 - 99 (mg/dL)     Dg Chest 1 View  10/26/8655  *RADIOLOGY REPORT*  Clinical Data: CHF, shortness of breath, weakness.  CHEST - 1 VIEW  Comparison: 02/10/2012  Findings: Heart and  mediastinal contours are within normal limits. No focal opacities or effusions.  No acute bony abnormality.  IMPRESSION: No active cardiopulmonary disease.  Original Report Authenticated By: Cyndie Chime, M.D.   Dg Chest 2 View  02/10/2012  *RADIOLOGY REPORT*  Clinical Data: Preoperative evaluation for left total knee arthroplasty.  Hypertension with diabetes.  CHEST - 2 VIEW  Comparison: None.  Findings: Heart and mediastinal contours are within normal limits. The lung fields appear clear with no signs of focal infiltrate or congestive failure.  No pleural fluid or significant peribronchial cuffing is seen.  Bony structures appear intact.  Surgical clips are identified in the region of the gastroesophageal junction.  IMPRESSION: No worrisome focal or acute cardiopulmonary abnormality noted  Original Report Authenticated By: Bertha Stakes, M.D.   Dg Knee 1-2 Views Left  02/10/2012  *RADIOLOGY REPORT*  Clinical Data: Preoperative radiograph.  Left total knee arthroplasty.  LEFT KNEE - 1-2 VIEW  Comparison: None.  Findings: Anatomic alignment.  Medial compartment joint space narrowing.  Moderate medial  compartment osteoarthritis.  Mild lateral compartment osteoarthritis.  Moderate patellofemoral osteoarthritis.  Small degenerative effusion.  Popliteal atherosclerosis. No fracture.  IMPRESSION: Moderate medial and patellofemoral osteoarthritis with small likely degenerative effusion.  Original Report Authenticated By: Andreas Newport, M.D.   X-ray Knee Left Port  02/16/2012  *RADIOLOGY REPORT*  Clinical Data: Postop knee replacement.  PORTABLE LEFT KNEE - 1-2 VIEW  Comparison: 02/10/2012  Findings: Changes of left knee replacement.  No hardware or bony complicating feature.  Soft tissue drain in place.  IMPRESSION: Left knee replacement.  No complicating feature.  Original Report Authenticated By: Cyndie Chime, M.D.    Impression:      Plan: 1. HTN continue home meds, pain control, 2 d echo  given elevated BNP, prn hydralazine . 2. DM hold metformin , start SSI 3. PVC' s EKG shows NSR , patient to continue with propranolol.  We will continue to follow along      [facility] 02/16/2012, 10:23 PM

## 2012-02-16 NOTE — Transfer of Care (Signed)
Immediate Anesthesia Transfer of Care Note  Patient: [name]  Procedure(s) Performed: Procedure(s) (LRB): TOTAL KNEE ARTHROPLASTY (Left)  Patient Location: PACU  Anesthesia Type: General  Level of Consciousness: sedated  Airway & Oxygen Therapy: Patient Spontanous Breathing and Patient connected to T-piece oxygen  Post-op Assessment: Report given to PACU RN and Post -op Vital signs reviewed and stable  Post vital signs: Reviewed and stable  Complications: No apparent anesthesia complications

## 2012-02-16 NOTE — Brief Op Note (Signed)
02/16/2012  9:40 AM  PATIENT:  [name]  61 y.o. female  PRE-OPERATIVE DIAGNOSIS:  Degenerative Joint Disease Left Knee  POST-OPERATIVE DIAGNOSIS:  Degenerative Joint Disease Left Knee  PROCEDURE:  Procedure(s) (LRB): TOTAL KNEE ARTHROPLASTY (Left)  SURGEON:  Surgeon(s) and Role:    * Javier Docker, MD - Primary  PHYSICIAN ASSISTANT:   ASSISTANTS: strader   ANESTHESIA:   general  EBL:  Total I/O In: 2000 [I.V.:2000] Out: 200 [Urine:150; Blood:50]  BLOOD ADMINISTERED:none  DRAINS: none   LOCAL MEDICATIONS USED:  MARCAINE     SPECIMEN:  No Specimen  DISPOSITION OF SPECIMEN:  N/A  COUNTS:  YES  TOURNIQUET:  * Missing tourniquet times found for documented tourniquets in log:  [id number] *  DICTATION: .Other Dictation: Dictation Number [id number]  PLAN OF CARE: Admit for overnight observation  PATIENT DISPOSITION:  PACU - hemodynamically stable.   Delay start of Pharmacological VTE agent (>24hrs) due to surgical blood loss or risk of bleeding: yes

## 2012-02-16 NOTE — Anesthesia Preprocedure Evaluation (Addendum)
Anesthesia Evaluation  Patient identified by MRN, date of birth, ID band Patient awake    Reviewed: Allergy & Precautions, H&P , NPO status , Patient's Chart, lab work & pertinent test results, reviewed documented beta blocker date and time   Airway Mallampati: II TM Distance: >3 FB Neck ROM: Full    Dental  (+) Teeth Intact and Dental Advisory Given   Pulmonary neg pulmonary ROS,  breath sounds clear to auscultation        Cardiovascular hypertension, Pt. on medications Rhythm:Regular Rate:Normal  Hx palpitations, neg w/u   Neuro/Psych Depression Peripheral neuropathy negative neurological ROS  negative psych ROS   GI/Hepatic negative GI ROS, Neg liver ROS,   Endo/Other  Diabetes mellitus-, Type 2, Oral Hypoglycemic Agents  Renal/GU negative Renal ROS  negative genitourinary   Musculoskeletal negative musculoskeletal ROS (+)   Abdominal   Peds negative pediatric ROS (+)  Hematology negative hematology ROS (+)   Anesthesia Other Findings   Reproductive/Obstetrics negative OB ROS                          Anesthesia Physical Anesthesia Plan  ASA: III  Anesthesia Plan: General   Post-op Pain Management:    Induction: Intravenous  Airway Management Planned: Oral ETT  Additional Equipment:   Intra-op Plan:   Post-operative Plan: Extubation in OR  Informed Consent: I have reviewed the patients History and Physical, chart, labs and discussed the procedure including the risks, benefits and alternatives for the proposed anesthesia with the patient or authorized representative who has indicated his/her understanding and acceptance.   Dental advisory given  Plan Discussed with: CRNA and Surgeon  Anesthesia Plan Comments:         Anesthesia Quick Evaluation

## 2012-02-16 NOTE — Interval H&P Note (Signed)
History and Physical Interval Note:  02/16/2012 7:29 AM  [name]  has presented today for surgery, with the diagnosis of Degenerative Joint Disease Left Knee  The various methods of treatment have been discussed with the patient and family. After consideration of risks, benefits and other options for treatment, the patient has consented to  Procedure(s) (LRB): TOTAL KNEE ARTHROPLASTY (Left) as a surgical intervention .  The patients' history has been reviewed, patient examined, no change in status, stable for surgery.  I have reviewed the patients' chart and labs.  Questions were answered to the patient's satisfaction.     Kire Ferg C

## 2012-02-16 NOTE — Anesthesia Postprocedure Evaluation (Signed)
  Anesthesia Post-op Note  Patient: [name]  Procedure(s) Performed: Procedure(s) (LRB): TOTAL KNEE ARTHROPLASTY (Left)  Patient Location: PACU  Anesthesia Type: General  Level of Consciousness: oriented and sedated  Airway and Oxygen Therapy: Patient Spontanous Breathing and Patient connected to nasal cannula oxygen  Post-op Pain: mild  Post-op Assessment: Post-op Vital signs reviewed, Patient's Cardiovascular Status Stable, Respiratory Function Stable and Patent Airway  Post-op Vital Signs: stable  Complications: No apparent anesthesia complications

## 2012-02-16 NOTE — Progress Notes (Signed)
Patient BP 171/94. Roma Schanz PA notified. No new orders given. Patient arousable and orientated X4. Will continue to monitor closely.

## 2012-02-17 ENCOUNTER — Encounter (HOSPITAL_COMMUNITY): Payer: Self-pay | Admitting: Specialist

## 2012-02-17 DIAGNOSIS — I10 Essential (primary) hypertension: Secondary | ICD-10-CM

## 2012-02-17 DIAGNOSIS — Z96652 Presence of left artificial knee joint: Secondary | ICD-10-CM

## 2012-02-17 DIAGNOSIS — I517 Cardiomegaly: Secondary | ICD-10-CM

## 2012-02-17 DIAGNOSIS — E1165 Type 2 diabetes mellitus with hyperglycemia: Secondary | ICD-10-CM

## 2012-02-17 LAB — GLUCOSE, CAPILLARY
Glucose-Capillary: 136 mg/dL — ABNORMAL HIGH (ref 70–99)
Glucose-Capillary: 86 mg/dL (ref 70–99)
Glucose-Capillary: 106 mg/dL — ABNORMAL HIGH (ref 70–99)

## 2012-02-17 LAB — CBC
HCT: 36 % (ref 36.0–46.0)
Hemoglobin: 11.4 g/dL — ABNORMAL LOW (ref 12.0–15.0)
RDW: 14.7 % (ref 11.5–15.5)
WBC: 10.4 10*3/uL (ref 4.0–10.5)

## 2012-02-17 LAB — HEMOGLOBIN A1C: Mean Plasma Glucose: 123 mg/dL — ABNORMAL HIGH (ref <117)

## 2012-02-17 LAB — BASIC METABOLIC PANEL
BUN: 9 mg/dL (ref 6–23)
Chloride: 100 mEq/L (ref 96–112)
GFR calc Af Amer: 80 mL/min — ABNORMAL LOW (ref >90)
Potassium: 3.5 mEq/L (ref 3.5–5.1)
Sodium: 134 mEq/L — ABNORMAL LOW (ref 135–145)

## 2012-02-17 LAB — TSH: TSH: 0.608 u[IU]/mL (ref 0.350–4.500)

## 2012-02-17 NOTE — Op Note (Signed)
[name], [name] NO.:  [id number]  MEDICAL RECORD NO.:  [id number]  LOCATION:  1608                         FACILITY:  [facility]  PHYSICIAN:  Jene Every, M.D.    DATE OF BIRTH:  [birth year]  DATE OF PROCEDURE:  02/16/2012 DATE OF DISCHARGE:                              OPERATIVE REPORT   PREOPERATIVE DIAGNOSES:  Degenerative joint disease, end-stage osteoarthrosis, varus deformity; left knee.  POSTOPERATIVE DIAGNOSES:  Degenerative joint disease, end-stage osteoarthrosis, varus deformity; left knee.  PROCEDURES PERFORMED:  Left total knee arthroplasty utilizing DePuy rotating platform, 3 femur, 3 tibia, 10 mm insert, 35 patella.  HISTORY:  A 61 year old with end-stage osteoarthrosis refractory to conservative treatment, bone-on-bone arthrosis indicated for replacement of degenerated joint.  Risks and benefits were discussed including bleeding, infection, damage to surrounding structures, suboptimal range of motion, need for revision, DVT, PE, anesthetic complications, etc.  TECHNIQUE:  With the patient in supine position, after induction of adequate general anesthesia with 2 g Kefzol, left lower extremity was prepped, draped, and exsanguinated in the usual sterile fashion.  A thigh tourniquet was inflated to 300 mmHg.  A midline incision was then made.  Full thickness flaps were developed over the knee.  Median parapatellar arthrotomy was performed.  The patella everted and knee flexed.  Severe tricompartmental osteoarthrosis was noted by detecting the medial compartment.  Osteophytes removed with a rongeur.  Remnant of medial and lateral menisci were removed as was the ACL.  We had elevated soft tissue medially of the proximal medial tibial plateau with a curved Crego for balancing ligaments, preserving the MCL attachment.  I then used an intramedullary guide, 5-degree left, 11 off the distal femur, it was pinned.  Oscillating saw performed the distal  femoral cut.  We then incised all distal femur and the anterior cortex to be a 3.  This was then pinned in 3 degrees of external rotation.  The anterior, posterior, and chamfer cuts were performed with the soft tissues protected with Crego at all times.  Next, attention was turned towards the tibia.  It was subluxed with talus, protected external alignment guide, bisecting the ankle parallel to the tibia, 10 off the high side which was laterally pinned.  Oscillating saw performed the tibial cut.  Soft tissue was protected.  This was sized to a 3.  We maximized the coverage as the medial aspect of the tibial tubercle was externally rotated.  It was then pinned, essentially drilled, and punch guide utilized.  We turned our attention back towards the femur.  We bisected the canal with a box cut, pinned it, used our oscillating saw, rasped the box cut.  We then placed our trial femur, 10 insert in full extension, full flexion, good stability, varus valgus stressing 0-30 degrees.  Attention turned towards the patella.  It was measured 22-23.  We planed it to a 15-mm, utilizing the patellar guide in terms of thickness, sized to a 35, drilling our peg holes, medializing the patella.  The patella trial was placed; and we had full extension, full flexion, good stability, and varus valgus stressing 0-30 degrees.  All instrumentation was removed, checked posteriorly, cauterized the genicula.  Popliteal was preserved.  Pulsatile lavage was used in the wound.  Knee was flexed.  Patella everted.  We mixed the cement on the back table in an appropriate fashion in the right proximal tibia.  It was injected under pressure, digitally pressurizing and then impacting the tibial tray.  We then cemented the femur, impacted the femur.  Redundant cement removed, placed a 10 trial insert and reduced it, found to be stable.  We held her in extension throughout the curing of the cement.  Redundant cement removed.   We then cemented the patellar button.  Patellar component held with a clamp after curing the cement.  Tested the stability.  She had full extension and full flexion, good stability of varus valgus stressing 0-30 degrees.  No instability.  Good patellofemoral tracking. Removed the trial and inspected the joint, meticulously removed any redundant cement.  We then copiously irrigated the wound, placed a 10 rotating platform insert at full extension, full flexion, good stability of varus valgus stressing, 0-30 degrees.  There was some snapping laterally which was the IT band, tightened, as well as an osteophyte removed with a rongeur and also a plica, which is divided as well.  This addressed the snapping laterally.  Next, the wound was copiously irrigated.  We placed a Hemovac and brought it out through a lateral stab wound in the skin.  We repaired the patellar arthrotomy with 1 Vicryl interrupted figure-of-8 sutures and then with a running V-Loc, subcu with 2-0 Vicryl simple sutures, skin was reapproximated with subcuticular suture.  The wound was reinforced.  Steri-Strips and sterile dressing were applied.  Knee immobilizer placed.  Tourniquet was deflated, and there was adequate revascularization appreciated.  The patient tolerated the procedure well.  No complications.  TOURNIQUET TIME:  1 hour and a half.     Jene Every, M.D.     [name]  D:  02/16/2012  T:  02/17/2012  Job:  [id number]

## 2012-02-17 NOTE — Progress Notes (Signed)
Physical Therapy Treatment Patient Details Name: [name] MRN: [id number] DOB: 08/03/1951 Today's Date: 02/17/2012 Time: 1420-1440 PT Time Calculation (min): 20 min  PT Assessment / Plan / Recommendation Comments on Treatment Session   PM session assisted pt OOB to amb in hallway, then back to bed for CPM.    Follow Up Recommendations  Skilled nursing facility    Barriers to Discharge Decreased caregiver support      Equipment Recommendations  Defer to next venue    Recommendations for Other Services OT consult  Frequency 7X/week   Plan      Precautions / Restrictions Precautions Precautions: Knee Required Braces or Orthoses: Knee Immobilizer - Left Knee Immobilizer - Left: Discontinue once straight leg raise with < 10 degree lag Restrictions Weight Bearing Restrictions: No Other Position/Activity Restrictions: WBAT    Pertinent Vitals/Pain C/o 4/10 L knee pain with act, repositioned     Mobility  Bed Mobility Bed Mobility: Supine to Sit;Sit to Supine Supine to Sit: 2: Max assist Sit to Supine: 2: Max assist Details for Bed Mobility Assistance: increased time and 50% VC's on proper technique Transfers Transfers: Sit to Stand;Stand to Sit Sit to Stand: 1: +2 Total assist;From bed Sit to Stand: Patient Percentage: 50% Stand to Sit: 1: +2 Total assist;To bed Stand to Sit: Patient Percentage: 50% Details for Transfer Assistance: 50% VC's on proper technique and hand placement Ambulation/Gait Ambulation/Gait Assistance: 1: +2 Total assist Ambulation/Gait: Patient Percentage: 60% Ambulation Distance (Feet): 28 Feet Assistive device: Rolling walker Ambulation/Gait Assistance Details: 50% VC's on proper sequencing, increasing posture and increasing R step length.  Very unsteady, shaky gait. Gait Pattern: Step-to pattern;Decreased stance time - right;Decreased stride length;Trunk flexed Stairs: No Wheelchair Mobility Wheelchair Mobility: No         PT  Goals Acute Rehab PT Goals PT Goal Formulation: With patient Time For Goal Achievement: 02/21/12 Potential to Achieve Goals: Good Pt will go Supine/Side to Sit: with supervision PT Goal: Supine/Side to Sit - Progress: Goal set today Pt will go Sit to Supine/Side: with supervision PT Goal: Sit to Supine/Side - Progress: Goal set today Pt will go Sit to Stand: with supervision PT Goal: Sit to Stand - Progress: Goal set today Pt will go Stand to Sit: with supervision PT Goal: Stand to Sit - Progress: Goal set today Pt will Ambulate: 51 - 150 feet;with supervision;with rolling walker PT Goal: Ambulate - Progress: Goal set today  Visit Information  Last PT Received On: 02/17/12 Assistance Needed: +2    Subjective Data  Subjective: My leg feels shaky.  My right leg is not very good either Patient Stated Goal: Reseme previous lifestyle with decreased pain   Cognition  Overall Cognitive Status: Appears within functional limits for tasks assessed/performed Arousal/Alertness: Awake/alert Orientation Level: Appears intact for tasks assessed Behavior During Session: [facility] for tasks performed    Balance   poor  End of Session Pt in bed with call light in reach   Felecia Shelling  PTA [facility]  Acute  Rehab Pager     [phone]

## 2012-02-17 NOTE — Progress Notes (Signed)
Clinical Social Work Department BRIEF PSYCHOSOCIAL ASSESSMENT 02/17/2012  Patient:  [name],[name]     Account Number:  [id number]     Admit date:  02/16/2012  Clinical Social Worker:  Candie Chroman  Date/Time:  02/17/2012 02:24 PM  Referred by:  Physician  Date Referred:  02/17/2012 Referred for  SNF Placement   Other Referral:   Interview type:  Patient Other interview type:    PSYCHOSOCIAL DATA Living Status:  ALONE Admitted from facility:   Level of care:   Primary support name:  Emme Rosenau Primary support relationship to patient:  SIBLING Degree of support available:   unclear    CURRENT CONCERNS Current Concerns  Post-Acute Placement   Other Concerns:    SOCIAL WORK ASSESSMENT / PLAN Pt is a 61 yr old female living at home prior to hospitalization. Met with pt to assist with d/c planning. ST SNF placement is needed. Pt is agreeable with rehab and search has been initiated. Pt has BCBS which requires prior approval for SNF. Bed offers will be provided as received .   Assessment/plan status:  Psychosocial Support/Ongoing Assessment of Needs Other assessment/ plan:   Information/referral to community resources:    PATIENT'S/FAMILY'S RESPONSE TO PLAN OF CARE: Pt is interested in ST SNF placement.    [name] LCSW [phone]

## 2012-02-17 NOTE — Care Management Note (Addendum)
    Page 1 of 2   02/23/2012     2:07:33 PM   CARE MANAGEMENT NOTE 02/23/2012  Patient:  [name], [name]   Account Number:  [id number]  Date Initiated:  02/17/2012  Documentation initiated by:  Colleen Can  Subjective/Objective Assessment:   dx endstage degenerative joint disease, osteoarthrosis; total knee replacemnt left     Action/Plan:   CM spoke with patient. States she is going to skilled facility fpor rehab   Anticipated DC Date:  02/19/2012   Anticipated DC Plan:  SKILLED NURSING FACILITY  In-house referral  Clinical Social Worker      DC Planning Services  CM consult      [facility] Choice  NA   Choice offered to / List presented to:  NA   DME arranged  [facility] ROLLING  CPM      DME agency  [facility]     HH arranged  HH-2 PT  HH-3 OT      [facility] agency  [facility]   Status of service:  Completed, signed off Medicare Important Message given?  NO (If response is "NO", the following Medicare IM given date fields will be blank) Date Medicare IM given:   Date Additional Medicare IM given:    Discharge Disposition:  HOME W HOME HEALTH SERVICES  Per UR Regulation:  Reviewed for med. necessity/level of care/duration of stay  If discussed at Long Length of Stay Meetings, dates discussed:    Comments:  02/23/2012 Raynelle Bring BSN CCM [phone] Notified by CSW  that insurance did not authorize SNF stay for patient. CM spoke with patient who advises that she lives alone in [place]. sStates she will have friend that will pick  her up  and that she will help her out. States she does have a friend that she plans to call to request assistance and will ask her to spend several nights with her. CM offered private duty -self pay list to patient to use as needed. Pt understands that insurance will not cover personal caregivers. She thanked me for the list of self pay providers. Pt will need RW and HH services which will be provided by [place]. HH  services to start tomorrow. List of HH agencies placed in shadow chart.

## 2012-02-17 NOTE — Progress Notes (Signed)
Subjective: 1 Day Post-Op Procedure(s) (LRB): TOTAL KNEE ARTHROPLASTY (Left) Patient reports pain as moderate.  Denies CP or SOB  Patient has complaints of knee pain as expected  We will start therapy today. Plan is to go SNF after hospital stay.  Objective: Vital signs in last 24 hours: Temp:  [97.4 F (36.3 C)-98.8 F (37.1 C)] 98.8 F (37.1 C) (05/24 0600) Pulse Rate:  [76-97] 81  (05/24 0600) Resp:  [11-16] 12  (05/24 0600) BP: (140-180)/(60-102) 144/81 mmHg (05/24 0742) SpO2:  [92 %-100 %] 97 % (05/24 0600) Weight:  [107.502 kg (237 lb)] 107.502 kg (237 lb) (05/23 1200)  Intake/Output from previous day:  Intake/Output Summary (Last 24 hours) at 02/17/12 0801 Last data filed at 02/17/12 0600  Gross per 24 hour  Intake   4080 ml  Output   3365 ml  Net    715 ml    Intake/Output this shift:    Labs: Results for orders placed during the hospital encounter of 02/16/12  GLUCOSE, CAPILLARY      Component Value Range   Glucose-Capillary 120 (*) 70 - 99 (mg/dL)  GLUCOSE, CAPILLARY      Component Value Range   Glucose-Capillary 152 (*) 70 - 99 (mg/dL)   Comment 1 Documented in Chart     Comment 2 Notify RN    GLUCOSE, CAPILLARY      Component Value Range   Glucose-Capillary 125 (*) 70 - 99 (mg/dL)  CBC      Component Value Range   WBC 10.4  4.0 - 10.5 (K/uL)   RBC 3.97  3.87 - 5.11 (MIL/uL)   Hemoglobin 11.4 (*) 12.0 - 15.0 (g/dL)   HCT 16.1  09.6 - 04.5 (%)   MCV 90.7  78.0 - 100.0 (fL)   MCH 28.7  26.0 - 34.0 (pg)   MCHC 31.7  30.0 - 36.0 (g/dL)   RDW 40.9  81.1 - 91.4 (%)   Platelets 304  150 - 400 (K/uL)  BASIC METABOLIC PANEL      Component Value Range   Sodium 134 (*) 135 - 145 (mEq/L)   Potassium 3.5  3.5 - 5.1 (mEq/L)   Chloride 100  96 - 112 (mEq/L)   CO2 24  19 - 32 (mEq/L)   Glucose, Bld 133 (*) 70 - 99 (mg/dL)   BUN 9  6 - 23 (mg/dL)   Creatinine, Ser 7.82  0.50 - 1.10 (mg/dL)   Calcium 8.4  8.4 - 95.6 (mg/dL)   GFR calc non Af Amer 69 (*) >90  (mL/min)   GFR calc Af Amer 80 (*) >90 (mL/min)  COMPREHENSIVE METABOLIC PANEL      Component Value Range   Sodium 134 (*) 135 - 145 (mEq/L)   Potassium 3.5  3.5 - 5.1 (mEq/L)   Chloride 100  96 - 112 (mEq/L)   CO2 24  19 - 32 (mEq/L)   Glucose, Bld 128 (*) 70 - 99 (mg/dL)   BUN 13  6 - 23 (mg/dL)   Creatinine, Ser 2.13  0.50 - 1.10 (mg/dL)   Calcium 8.2 (*) 8.4 - 10.5 (mg/dL)   Total Protein 6.5  6.0 - 8.3 (g/dL)   Albumin 3.1 (*) 3.5 - 5.2 (g/dL)   AST 19  0 - 37 (U/L)   ALT 22  0 - 35 (U/L)   Alkaline Phosphatase 108  39 - 117 (U/L)   Total Bilirubin 0.3  0.3 - 1.2 (mg/dL)   GFR calc non Af Amer 61 (*) >  90 (mL/min)   GFR calc Af Amer 70 (*) >90 (mL/min)  CBC      Component Value Range   WBC 13.2 (*) 4.0 - 10.5 (K/uL)   RBC 3.86 (*) 3.87 - 5.11 (MIL/uL)   Hemoglobin 11.3 (*) 12.0 - 15.0 (g/dL)   HCT 16.1 (*) 09.6 - 46.0 (%)   MCV 90.9  78.0 - 100.0 (fL)   MCH 29.3  26.0 - 34.0 (pg)   MCHC 32.2  30.0 - 36.0 (g/dL)   RDW 04.5  40.9 - 81.1 (%)   Platelets 313  150 - 400 (K/uL)  PRO B NATRIURETIC PEPTIDE      Component Value Range   Pro B Natriuretic peptide (BNP) 531.3 (*) 0 - 125 (pg/mL)  TSH      Component Value Range   TSH 0.608  0.350 - 4.500 (uIU/mL)  HEMOGLOBIN A1C      Component Value Range   Hemoglobin A1C 5.9 (*) <5.7 (%)   Mean Plasma Glucose 123 (*) <117 (mg/dL)  CARDIAC PANEL(CRET KIN+CKTOT+MB+TROPI)      Component Value Range   Total CK 106  7 - 177 (U/L)   CK, MB 1.5  0.3 - 4.0 (ng/mL)   Troponin I <0.30  <0.30 (ng/mL)   Relative Index 1.4  0.0 - 2.5   GLUCOSE, CAPILLARY      Component Value Range   Glucose-Capillary 129 (*) 70 - 99 (mg/dL)  GLUCOSE, CAPILLARY      Component Value Range   Glucose-Capillary 130 (*) 70 - 99 (mg/dL)    Exam - Neurologically intact Neurovascular intact Sensation intact distally Dorsiflexion/Plantar flexion intact Incision: dressing C/D/I Compartment soft Dressing - clean, dry Motor function intact - moving foot  and toes well on exam.  Hemovac pulled without difficulty.  Assessment/Plan: 1 Day Post-Op Procedure(s) (LRB): TOTAL KNEE ARTHROPLASTY (Left)  Advance diet Up with therapy Past Medical History  Diagnosis Date  . Hypertension   . Hyperlipidemia   . Obesity   . Diabetes mellitus     ORAL MEDICATION  . GERD (gastroesophageal reflux disease)     RARE- OTC ANTACID IF NEEDED--PAST HX OF ULCER  . Neuromuscular disorder     NUMBNESS/TINGLING, JABBING PAIN IN FEET--NEURONTIN HELPING  . 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  PT ALSO HAS LOWER BACK PAIN AT TIMES  . Anxiety   . Depression   . Kidney stones     PT PASSED STONES--NO KNOWN STONES AT PRESENT TIMES  . Urinary incontinence   . Dysrhythmia     PT STATES IRREGULAR HB FELT TO BE RELATED TO STRESS--NEGATIVE CARDIAC WORK UP IN 2011 WITH DR. Shirlee Latch - [facility] --NUCLEAR STRESS TEST, ECHO AND HOLTER  MONITOR RESULTS IN EPIC   . Sleep apnea 02/10/2012    STOP BANG SCORE 4    DVT Prophylaxis - Xarelto  Protocol Weight-Bearing as tolerated to left leg No vaccines. Will d/c foley once up with PT KVO fluids once taking pos well Social worker consulted for placement Dressing change tomorrow  Waddell Iten R. 02/17/2012, 8:01 AM

## 2012-02-17 NOTE — Progress Notes (Signed)
Clinical Social Work Department CLINICAL SOCIAL WORK PLACEMENT NOTE 02/17/2012  Patient:  [name], [name]  Account Number:  [id number] Admit date:  02/16/2012  Clinical Social Worker:  [name], LCSW  Date/time:  02/17/2012 02:34 PM  Clinical Social Work is seeking post-discharge placement for this patient at the following level of care:   SKILLED NURSING   (*CSW will update this form in Epic as items are completed)   02/17/2012  Patient/family provided with [facility] Department of Clinical Social Work's list of facilities offering this level of care within the geographic area requested by the patient (or if unable, by the patient's family).  02/17/2012  Patient/family informed of their freedom to choose among providers that offer the needed level of care, that participate in Medicare, Medicaid or managed care program needed by the patient, have an available bed and are willing to accept the patient.  02/17/2012  Patient/family informed of [facility]' ownership interest in [facility], as well as of the fact that they are under no obligation to receive care at this facility.  PASARR submitted to EDS on 02/17/2012 PASARR number received from [employer] on   FL2 transmitted to all facilities in geographic area requested by pt/family on   FL2 transmitted to all facilities within larger geographic area on   Patient informed that his/her managed care company has contracts with or will negotiate with  certain facilities, including the following:     Patient/family informed of bed offers received:   Patient chooses bed at  Physician recommends and patient chooses bed at    Patient to be transferred to  on   Patient to be transferred to facility by   The following physician request were entered in Epic:   Additional Comments:  [name]  LCSW  [phone]

## 2012-02-17 NOTE — Progress Notes (Signed)
  Echocardiogram 2D Echocardiogram has been performed.  Cathie Beams Deneen 02/17/2012, 1:48 PM

## 2012-02-17 NOTE — Evaluation (Signed)
Physical Therapy Evaluation Patient Details Name: [name] MRN: [id number] DOB: 25-Sep-1951 Today's Date: 02/17/2012 Time: 4098-1191 PT Time Calculation (min): 41 min  PT Assessment / Plan / Recommendation Clinical Impression  Pt with L TKR presents with decreased L LE strength/ROM, decreased R knee ROM and limited functional mobility    PT Assessment  Patient needs continued PT services    Follow Up Recommendations  Skilled nursing facility    Barriers to Discharge Decreased caregiver support      lEquipment Recommendations  Defer to next venue    Recommendations for Other Services OT consult   Frequency 7X/week    Precautions / Restrictions Precautions Precautions: Knee Required Braces or Orthoses: Knee Immobilizer - Left Knee Immobilizer - Left: Discontinue once straight leg raise with < 10 degree lag Restrictions Weight Bearing Restrictions: No Other Position/Activity Restrictions: WBAT   Pertinent Vitals/Pain 4/10      Mobility  Bed Mobility Bed Mobility: Supine to Sit Supine to Sit: 3: Mod assist Details for Bed Mobility Assistance: cues for sequence and for use of R LE and UEs to self-assist Transfers Transfers: Sit to Stand;Stand to Sit Sit to Stand: 1: +2 Total assist Sit to Stand: Patient Percentage: 60% Stand to Sit: 1: +2 Total assist Stand to Sit: Patient Percentage: 60% Details for Transfer Assistance: cues for use of UEs to self assist and for LE management Ambulation/Gait Ambulation/Gait Assistance: 1: +2 Total assist Ambulation/Gait: Patient Percentage: 70% Ambulation Distance (Feet): 13 Feet Assistive device: Rolling walker Ambulation/Gait Assistance Details: cues for posture, stride length, position from RW, increased UE WB, and  sequence Gait Pattern: Step-to pattern;Decreased step length - right    Exercises Total Joint Exercises Ankle Circles/Pumps: 10 reps;AAROM;Both;Supine Quad Sets: AROM;10 reps;Supine;Both Heel Slides:  AAROM;10 reps;Supine;Left Straight Leg Raises: AAROM;Left;10 reps;Supine   PT Diagnosis: Difficulty walking  PT Problem List: Decreased strength;Decreased range of motion;Decreased activity tolerance;Decreased mobility;Decreased knowledge of use of DME;Pain;Obesity PT Treatment Interventions: DME instruction;Gait training;Stair training;Functional mobility training;Therapeutic activities;Therapeutic exercise;Patient/family education   PT Goals Acute Rehab PT Goals PT Goal Formulation: With patient Time For Goal Achievement: 02/21/12 Potential to Achieve Goals: Good Pt will go Supine/Side to Sit: with supervision PT Goal: Supine/Side to Sit - Progress: Goal set today Pt will go Sit to Supine/Side: with supervision PT Goal: Sit to Supine/Side - Progress: Goal set today Pt will go Sit to Stand: with supervision PT Goal: Sit to Stand - Progress: Goal set today Pt will go Stand to Sit: with supervision PT Goal: Stand to Sit - Progress: Goal set today Pt will Ambulate: 51 - 150 feet;with supervision;with rolling walker PT Goal: Ambulate - Progress: Goal set today  Visit Information  Last PT Received On: 02/17/12 Assistance Needed: +2    Subjective Data  Subjective: My leg feels shaky.  My right leg is not very good either Patient Stated Goal: Reseme previous lifestyle with decreased pain   Prior Functioning  Home Living Lives With: Alone Prior Function Level of Independence: Independent with assistive device(s) Able to Take Stairs?: Yes Vocation: Unemployed Communication Communication: No difficulties Dominant Hand: Right    Cognition  Overall Cognitive Status: Appears within functional limits for tasks assessed/performed Arousal/Alertness: Awake/alert Orientation Level: Appears intact for tasks assessed Behavior During Session: [facility] for tasks performed    Extremity/Trunk Assessment Right Upper Extremity Assessment RUE ROM/Strength/Tone: Within functional levels Left Upper  Extremity Assessment LUE ROM/Strength/Tone: Within functional levels Right Lower Extremity Assessment RLE ROM/Strength/Tone: Deficits RLE ROM/Strength/Tone Deficits: Knee flex limited to  95 with 4/5 quads Left Lower Extremity Assessment LLE ROM/Strength/Tone: Deficits LLE ROM/Strength/Tone Deficits: -10 - 45 AAROM at knee with 2/5 quads   Balance    End of Session PT - End of Session Equipment Utilized During Treatment: Left knee immobilizer Activity Tolerance: Patient tolerated treatment well Patient left: in chair;with call bell/phone within reach Nurse Communication: Mobility status   Eliav Mechling 02/17/2012, 12:06 PM

## 2012-02-17 NOTE — Progress Notes (Signed)
Subjective: Postop day 1 Blood pressure improved overnight Minimal knee pain  Objective: Vital signs in last 24 hours: Filed Vitals:   02/17/12 0144 02/17/12 0600 02/17/12 0742 02/17/12 1001  BP: 147/92 150/89 144/81 130/85  Pulse: 91 81  90  Temp: 97.9 F (36.6 C) 98.8 F (37.1 C)  98.2 F (36.8 C)  TempSrc: Oral Oral  Oral  Resp: 12 12  14   Height:      Weight:      SpO2: 98% 97%  96%    Intake/Output Summary (Last 24 hours) at 02/17/12 1059 Last data filed at 02/17/12 1010  Gross per 24 hour  Intake   1920 ml  Output   4165 ml  Net  -2245 ml    Weight change:   HENT:  Head: Normocephalic and atraumatic.  Eyes: Pupils are equal, round, and reactive to light.  Neck: Normal range of motion. Neck supple.  Cardiovascular: Normal rate and regular rhythm.  Pulmonary/Chest: Effort normal. No respiratory distress.    Abdominal: Soft. She exhibits no distension. There is no tenderness. There is no rebound.  Neurological: She is alert.  Skin: Skin is warm and dry.    Lab Results: Results for orders placed during the hospital encounter of 02/16/12 (from the past 24 hour(s))  GLUCOSE, CAPILLARY     Status: Abnormal   Collection Time   02/16/12  4:31 PM      Component Value Range   Glucose-Capillary 125 (*) 70 - 99 (mg/dL)  COMPREHENSIVE METABOLIC PANEL     Status: Abnormal   Collection Time   02/16/12  6:29 PM      Component Value Range   Sodium 134 (*) 135 - 145 (mEq/L)   Potassium 3.5  3.5 - 5.1 (mEq/L)   Chloride 100  96 - 112 (mEq/L)   CO2 24  19 - 32 (mEq/L)   Glucose, Bld 128 (*) 70 - 99 (mg/dL)   BUN 13  6 - 23 (mg/dL)   Creatinine, Ser 6.29  0.50 - 1.10 (mg/dL)   Calcium 8.2 (*) 8.4 - 10.5 (mg/dL)   Total Protein 6.5  6.0 - 8.3 (g/dL)   Albumin 3.1 (*) 3.5 - 5.2 (g/dL)   AST 19  0 - 37 (U/L)   ALT 22  0 - 35 (U/L)   Alkaline Phosphatase 108  39 - 117 (U/L)   Total Bilirubin 0.3  0.3 - 1.2 (mg/dL)   GFR calc non Af Amer 61 (*) >90 (mL/min)   GFR calc Af  Amer 70 (*) >90 (mL/min)  CBC     Status: Abnormal   Collection Time   02/16/12  6:29 PM      Component Value Range   WBC 13.2 (*) 4.0 - 10.5 (K/uL)   RBC 3.86 (*) 3.87 - 5.11 (MIL/uL)   Hemoglobin 11.3 (*) 12.0 - 15.0 (g/dL)   HCT 52.8 (*) 41.3 - 46.0 (%)   MCV 90.9  78.0 - 100.0 (fL)   MCH 29.3  26.0 - 34.0 (pg)   MCHC 32.2  30.0 - 36.0 (g/dL)   RDW 24.4  01.0 - 27.2 (%)   Platelets 313  150 - 400 (K/uL)  PRO B NATRIURETIC PEPTIDE     Status: Abnormal   Collection Time   02/16/12  6:29 PM      Component Value Range   Pro B Natriuretic peptide (BNP) 531.3 (*) 0 - 125 (pg/mL)  TSH     Status: Normal   Collection Time  02/16/12  6:29 PM      Component Value Range   TSH 0.608  0.350 - 4.500 (uIU/mL)  HEMOGLOBIN A1C     Status: Abnormal   Collection Time   02/16/12  6:29 PM      Component Value Range   Hemoglobin A1C 5.9 (*) <5.7 (%)   Mean Plasma Glucose 123 (*) <117 (mg/dL)  CARDIAC PANEL(CRET KIN+CKTOT+MB+TROPI)     Status: Normal   Collection Time   02/16/12  6:29 PM      Component Value Range   Total CK 106  7 - 177 (U/L)   CK, MB 1.5  0.3 - 4.0 (ng/mL)   Troponin I <0.30  <0.30 (ng/mL)   Relative Index 1.4  0.0 - 2.5   GLUCOSE, CAPILLARY     Status: Abnormal   Collection Time   02/16/12  9:39 PM      Component Value Range   Glucose-Capillary 129 (*) 70 - 99 (mg/dL)  CBC     Status: Abnormal   Collection Time   02/17/12  4:24 AM      Component Value Range   WBC 10.4  4.0 - 10.5 (K/uL)   RBC 3.97  3.87 - 5.11 (MIL/uL)   Hemoglobin 11.4 (*) 12.0 - 15.0 (g/dL)   HCT 16.1  09.6 - 04.5 (%)   MCV 90.7  78.0 - 100.0 (fL)   MCH 28.7  26.0 - 34.0 (pg)   MCHC 31.7  30.0 - 36.0 (g/dL)   RDW 40.9  81.1 - 91.4 (%)   Platelets 304  150 - 400 (K/uL)  BASIC METABOLIC PANEL     Status: Abnormal   Collection Time   02/17/12  4:24 AM      Component Value Range   Sodium 134 (*) 135 - 145 (mEq/L)   Potassium 3.5  3.5 - 5.1 (mEq/L)   Chloride 100  96 - 112 (mEq/L)   CO2 24  19 -  32 (mEq/L)   Glucose, Bld 133 (*) 70 - 99 (mg/dL)   BUN 9  6 - 23 (mg/dL)   Creatinine, Ser 7.82  0.50 - 1.10 (mg/dL)   Calcium 8.4  8.4 - 95.6 (mg/dL)   GFR calc non Af Amer 69 (*) >90 (mL/min)   GFR calc Af Amer 80 (*) >90 (mL/min)  GLUCOSE, CAPILLARY     Status: Abnormal   Collection Time   02/17/12  7:26 AM      Component Value Range   Glucose-Capillary 130 (*) 70 - 99 (mg/dL)     Micro: Recent Results (from the past 240 hour(s))  SURGICAL PCR SCREEN     Status: Normal   Collection Time   02/10/12 11:13 AM      Component Value Range Status Comment   MRSA, PCR NEGATIVE  NEGATIVE  Final    Staphylococcus aureus NEGATIVE  NEGATIVE  Final     Studies/Results: Dg Chest 1 View  02/16/2012  *RADIOLOGY REPORT*  Clinical Data: CHF, shortness of breath, weakness.  CHEST - 1 VIEW  Comparison: 02/10/2012  Findings: Heart and mediastinal contours are within normal limits. No focal opacities or effusions.  No acute bony abnormality.  IMPRESSION: No active cardiopulmonary disease.  Original Report Authenticated By: Cyndie Chime, M.D.   X-ray Knee Left Port  02/16/2012  *RADIOLOGY REPORT*  Clinical Data: Postop knee replacement.  PORTABLE LEFT KNEE - 1-2 VIEW  Comparison: 02/10/2012  Findings: Changes of left knee replacement.  No hardware or bony complicating feature.  Soft tissue drain in place.  IMPRESSION: Left knee replacement.  No complicating feature.  Original Report Authenticated By: Cyndie Chime, M.D.    Medications:  Scheduled Meds:   . buPROPion  450 mg Oral Daily  . busPIRone  30 mg Oral BID  .  ceFAZolin (ANCEF) IV  2 g Intravenous Q6H  . chlorthalidone  25 mg Oral Q breakfast  . docusate sodium  100 mg Oral BID  . gabapentin  300 mg Oral TID  . HYDROcodone-acetaminophen  1-2 tablet Oral Q4H  . HYDROmorphone      . insulin aspart  0-15 Units Subcutaneous TID WC  . lisinopril  40 mg Oral Q breakfast  . OLANZapine  30 mg Oral QHS  . propranolol  10 mg Oral BID  .  rivaroxaban  10 mg Oral Q breakfast  . verapamil  240 mg Oral QHS  . Vilazodone HCl  40 mg Oral Q supper  . DISCONTD: buPROPion  450 mg Oral Q breakfast  . DISCONTD: HYDROcodone-acetaminophen  1-2 tablet Oral Q4H  . DISCONTD: insulin aspart  0-15 Units Subcutaneous TID WC  . DISCONTD: metFORMIN  500 mg Oral BID WC   Continuous Infusions:   . sodium chloride 0.45 % 1,000 mL with potassium chloride 20 mEq infusion    . DISCONTD: 0.45 % NaCl with KCl 20 mEq / L 100 mL/hr (02/16/12 1342)   PRN Meds:.acetaminophen, acetaminophen, albuterol, bisacodyl, clonazePAM, diphenhydrAMINE, hydrALAZINE, HYDROmorphone (DILAUDID) injection, menthol-cetylpyridinium, methocarbamol (ROBAXIN) IV, methocarbamol, metoCLOPramide (REGLAN) injection, metoCLOPramide, ondansetron (ZOFRAN) IV, ondansetron, phenol, polyethylene glycol, DISCONTD:  HYDROmorphone (DILAUDID) injection   Assessment: #1 hypertension currently on chlorthalidone, lisinopril, propranolol, verapamil improving #2 anemia status post surgery, stable at around 11.4 #3 history of palpitations continue propranolol previous workup has been negative #4 diabetes, CBG control continue Accu-Cheks  We'll continue to follow along   LOS: 1 day   [facility] 02/17/2012, 10:59 AM

## 2012-02-18 DIAGNOSIS — E1165 Type 2 diabetes mellitus with hyperglycemia: Secondary | ICD-10-CM

## 2012-02-18 DIAGNOSIS — I10 Essential (primary) hypertension: Secondary | ICD-10-CM

## 2012-02-18 LAB — GLUCOSE, CAPILLARY
Glucose-Capillary: 118 mg/dL — ABNORMAL HIGH (ref 70–99)
Glucose-Capillary: 92 mg/dL (ref 70–99)
Glucose-Capillary: 97 mg/dL (ref 70–99)

## 2012-02-18 LAB — CBC
HCT: 36.8 % (ref 36.0–46.0)
MCHC: 31.5 g/dL (ref 30.0–36.0)
RDW: 15 % (ref 11.5–15.5)
WBC: 11.6 10*3/uL — ABNORMAL HIGH (ref 4.0–10.5)

## 2012-02-18 NOTE — Evaluation (Signed)
Occupational Therapy Evaluation Patient Details Name: [name] MRN: [id number] DOB: 1951-05-14 Today's Date: 02/18/2012 Time: 4696-2952 OT Time Calculation (min): 20 min  OT Assessment / Plan / Recommendation Clinical Impression  Pt with L TKR presents with decreased strength and independence with ADL and will benefit from skilled OT services to imrpove her safety and independence with these tasks.     OT Assessment  Patient needs continued OT Services    Follow Up Recommendations  Skilled nursing facility    Barriers to Discharge      Equipment Recommendations  Defer to next venue    Recommendations for Other Services    Frequency  Min 1X/week    Precautions / Restrictions Precautions Precautions: Knee Required Braces or Orthoses: Knee Immobilizer - Left Knee Immobilizer - Left: Discontinue once straight leg raise with < 10 degree lag Restrictions Weight Bearing Restrictions: No Other Position/Activity Restrictions: WBAT        ADL  Eating/Feeding: Simulated;Independent Where Assessed - Eating/Feeding: Chair Grooming: Simulated;Set up Where Assessed - Grooming: Supine, head of bed up Upper Body Bathing: Simulated;Chest;Right arm;Left arm;Abdomen;Supervision/safety;Set up Where Assessed - Upper Body Bathing: Unsupported sitting Lower Body Bathing: Simulated;Moderate assistance Where Assessed - Lower Body Bathing: Supported sit to stand Upper Body Dressing: Simulated;Set up;Supervision/safety Where Assessed - Upper Body Dressing: Unsupported sitting Lower Body Dressing: Simulated;Maximal assistance Where Assessed - Lower Body Dressing: Sopported sit to stand Toilet Transfer: Performed;Minimal assistance Toilet Transfer Method: Stand pivot Toilet Transfer Equipment: Bedside commode Toileting - Clothing Manipulation and Hygiene: Simulated;Minimal assistance Where Assessed - Engineer, mining and Hygiene: Sit to stand from 3-in-1 or  toilet Tub/Shower Transfer Method: Not assessed ADL Comments: pt very tangential in conversation. Needs redirecting in order to be safe and focus on task at hand.     OT Diagnosis: Generalized weakness  OT Problem List: Decreased strength;Decreased range of motion;Decreased knowledge of use of DME or AE OT Treatment Interventions: Self-care/ADL training;Therapeutic activities;DME and/or AE instruction;Patient/family education   OT Goals Acute Rehab OT Goals OT Goal Formulation: With patient Time For Goal Achievement: 02/25/12 Potential to Achieve Goals: Good ADL Goals Pt Will Perform Grooming: with min assist;Other (comment);Standing at sink (min guard assist) ADL Goal: Grooming - Progress: Goal set today Pt Will Perform Lower Body Bathing: with min assist;Sit to stand from chair;Sit to stand from bed ADL Goal: Lower Body Bathing - Progress: Goal set today Pt Will Perform Lower Body Dressing: with min assist;Sit to stand from chair;Sit to stand from bed ADL Goal: Lower Body Dressing - Progress: Goal set today Pt Will Transfer to Toilet: with min assist;Other (comment);Ambulation;3-in-1 (min guard assist) ADL Goal: Toilet Transfer - Progress: Goal set today Pt Will Perform Toileting - Clothing Manipulation: with min assist;Other (comment);Standing (min guard) ADL Goal: Toileting - Clothing Manipulation - Progress: Goal set today Additional ADL Goal #1: pt will transfer to EOB in prep for ADl with supervision ADL Goal: Additional Goal #1 - Progress: Goal set today  Visit Information  Last OT Received On: 02/18/12 Assistance Needed: +1    Subjective Data  Subjective:  Are you going to be working with me at the rehab? Patient Stated Goal: agreeable to OT. none stated independently   Prior Functioning  Home Living Lives With: Alone Available Help at Discharge: Skilled Nursing Facility Prior Function Level of Independence: Independent with assistive device(s) Able to Take Stairs?:  Yes Communication Communication: No difficulties Dominant Hand: Right    Cognition  Overall Cognitive Status: Appears within functional limits  for tasks assessed/performed Arousal/Alertness: Awake/alert Orientation Level: Appears intact for tasks assessed Behavior During Session: [facility] for tasks performed Cognition - Other Comments: although tangential with conversation and needs some cueing to stay focused on task.     Extremity/Trunk Assessment Right Upper Extremity Assessment RUE ROM/Strength/Tone: Within functional levels Left Upper Extremity Assessment LUE ROM/Strength/Tone: Within functional levels   Mobility Bed Mobility Bed Mobility: Supine to Sit Supine to Sit: 3: Mod assist Sit to Supine: 3: Mod assist;HOB elevated Details for Bed Mobility Assistance: cues for sequence and technique Transfers Transfers: Sit to Stand;Stand to Sit Sit to Stand: 4: Min assist;With upper extremity assist Stand to Sit: 4: Min assist;With upper extremity assist Details for Transfer Assistance: cues for hand placement and LE management   Exercise Total Joint Exercises Ankle Circles/Pumps: 20 reps;AAROM;Supine;Both Quad Sets: AROM;20 reps;Both;Supine Heel Slides: AAROM;20 reps;Left;Supine Straight Leg Raises: AAROM;20 reps;Left;Supine  Balance Balance Balance Assessed: Yes Dynamic Standing Balance Dynamic Standing - Level of Assistance: 4: Min assist;Other (comment) (to wipe periareas)  End of Session OT - End of Session Activity Tolerance: Patient tolerated treatment well Patient left: in bed;with call bell/phone within reach   Lennox Laity [phone] 02/18/2012, 2:28 PM

## 2012-02-18 NOTE — Plan of Care (Signed)
Problem: Phase I Progression Outcomes Goal: Dangle evening of surgery Outcome: Not Met (add Reason) It was reported that pt did not wanted to dangle

## 2012-02-18 NOTE — Progress Notes (Signed)
Physical Therapy Treatment Patient Details Name: [name] MRN: [id number] DOB: 06/17/1951 Today's Date: 02/18/2012 Time: 7829-5621 PT Time Calculation (min): 29 min  PT Assessment / Plan / Recommendation Comments on Treatment Session  Pt with increased difficulty focussing on task and following through with cues.      Follow Up Recommendations  Skilled nursing facility    Barriers to Discharge        Equipment Recommendations  Defer to next venue    Recommendations for Other Services OT consult  Frequency 7X/week   Plan Discharge plan remains appropriate    Precautions / Restrictions Precautions Precautions: Knee Required Braces or Orthoses: Knee Immobilizer - Left Knee Immobilizer - Left: Discontinue once straight leg raise with < 10 degree lag Restrictions Weight Bearing Restrictions: No Other Position/Activity Restrictions: WBAT   Pertinent Vitals/Pain     Mobility  Bed Mobility Bed Mobility: Supine to Sit Supine to Sit: 3: Mod assist Sit to Supine: 3: Mod assist;HOB elevated Details for Bed Mobility Assistance: cues for sequence and technique Transfers Transfers: Sit to Stand;Stand to Sit Sit to Stand: 3: Mod assist;With upper extremity assist;From bed;From chair/3-in-1;With armrests Stand to Sit: 3: Mod assist;To bed;To chair/3-in-1;With upper extremity assist;With armrests Details for Transfer Assistance: cues for hand placement and LE management Ambulation/Gait Ambulation/Gait Assistance: 3: Mod assist Ambulation Distance (Feet): 75 Feet (75' and 18') Assistive device: Rolling walker Ambulation/Gait Assistance Details: constant cues for sequence, posture, stride length, position from RW and general saftey awareness Gait Pattern: Step-to pattern;Decreased stance time - right;Decreased stride length;Trunk flexed    Exercises     PT Diagnosis:    PT Problem List:   PT Treatment Interventions:     PT Goals Acute Rehab PT Goals PT Goal Formulation:  With patient Time For Goal Achievement: 02/21/12 Potential to Achieve Goals: Good (pt ltd this am 2* difficulty following cues consistently) Pt will go Supine/Side to Sit: with supervision PT Goal: Supine/Side to Sit - Progress: Not progressing Pt will go Sit to Supine/Side: with supervision PT Goal: Sit to Supine/Side - Progress: Not progressing Pt will go Sit to Stand: with supervision PT Goal: Sit to Stand - Progress: Not progressing Pt will go Stand to Sit: with supervision PT Goal: Stand to Sit - Progress: Not progressing Pt will Ambulate: 51 - 150 feet;with supervision;with rolling walker PT Goal: Ambulate - Progress: Not progressing  Visit Information  Last PT Received On: 02/18/12 Assistance Needed: +1    Subjective Data  Subjective: I've been talking to all kinds of people who aren't here   Cognition  Overall Cognitive Status: Appears within functional limits for tasks assessed/performed Arousal/Alertness: Awake/alert Orientation Level: Appears intact for tasks assessed Behavior During Session: [facility] for tasks performed Cognition - Other Comments: easily distracted and with constant repetition of cues needed to focus on task.  marked differenence from am session - RN aware    Balance  Balance Balance Assessed: Yes Dynamic Standing Balance Dynamic Standing - Level of Assistance: 4: Min assist;Other (comment) (to wipe periareas)  End of Session PT - End of Session Equipment Utilized During Treatment: Left knee immobilizer Activity Tolerance: Patient tolerated treatment well Patient left: in bed;with call bell/phone within reach;with bed alarm set Nurse Communication: Mobility status CPM Left Knee CPM Left Knee: On    Adis Sturgill 02/18/2012, 4:19 PM

## 2012-02-18 NOTE — Progress Notes (Signed)
Subjective: Blood pressures improved Patient has no complaints   Objective: Vital signs in last 24 hours: Filed Vitals:   02/17/12 1349 02/17/12 2233 02/18/12 0532 02/18/12 0853  BP: 106/72 154/89 133/76 149/81  Pulse: 72 94 88   Temp: 98.3 F (36.8 C) 98.7 F (37.1 C) 98.2 F (36.8 C)   TempSrc: Oral Oral Oral   Resp: 15 16 20    Height:      Weight:      SpO2: 90% 95% 96%     Intake/Output Summary (Last 24 hours) at 02/18/12 1025 Last data filed at 02/18/12 1019  Gross per 24 hour  Intake    840 ml  Output    875 ml  Net    -35 ml    Weight change:   Head: Normocephalic and atraumatic.  Eyes: Pupils are equal, round, and reactive to light.  Neck: Normal range of motion. Neck supple.  Cardiovascular: Normal rate and regular rhythm.  Pulmonary/Chest: Effort normal. No respiratory distress.  Abdominal: Soft. She exhibits no distension. There is no tenderness. There is no rebound.  Neurological: She is alert.  Skin: Skin is warm and dry.    Lab Results: Results for orders placed during the hospital encounter of 02/16/12 (from the past 24 hour(s))  GLUCOSE, CAPILLARY     Status: Abnormal   Collection Time   02/17/12 11:12 AM      Component Value Range   Glucose-Capillary 136 (*) 70 - 99 (mg/dL)  GLUCOSE, CAPILLARY     Status: Normal   Collection Time   02/17/12  4:57 PM      Component Value Range   Glucose-Capillary 86  70 - 99 (mg/dL)  GLUCOSE, CAPILLARY     Status: Abnormal   Collection Time   02/17/12 10:23 PM      Component Value Range   Glucose-Capillary 106 (*) 70 - 99 (mg/dL)  CBC     Status: Abnormal   Collection Time   02/18/12  4:32 AM      Component Value Range   WBC 11.6 (*) 4.0 - 10.5 (K/uL)   RBC 4.01  3.87 - 5.11 (MIL/uL)   Hemoglobin 11.6 (*) 12.0 - 15.0 (g/dL)   HCT 13.0  86.5 - 78.4 (%)   MCV 91.8  78.0 - 100.0 (fL)   MCH 28.9  26.0 - 34.0 (pg)   MCHC 31.5  30.0 - 36.0 (g/dL)   RDW 69.6  29.5 - 28.4 (%)   Platelets 350  150 - 400 (K/uL)   GLUCOSE, CAPILLARY     Status: Abnormal   Collection Time   02/18/12  7:43 AM      Component Value Range   Glucose-Capillary 118 (*) 70 - 99 (mg/dL)    Micro: Recent Results (from the past 240 hour(s))  SURGICAL PCR SCREEN     Status: Normal   Collection Time   02/10/12 11:13 AM      Component Value Range Status Comment   MRSA, PCR NEGATIVE  NEGATIVE  Final    Staphylococcus aureus NEGATIVE  NEGATIVE  Final     Studies/Results: Dg Chest 1 View  02/16/2012  *RADIOLOGY REPORT*  Clinical Data: CHF, shortness of breath, weakness.  CHEST - 1 VIEW  Comparison: 02/10/2012  Findings: Heart and mediastinal contours are within normal limits. No focal opacities or effusions.  No acute bony abnormality.  IMPRESSION: No active cardiopulmonary disease.  Original Report Authenticated By: Cyndie Chime, M.D.   X-ray Knee Left Port  02/16/2012  *RADIOLOGY REPORT*  Clinical Data: Postop knee replacement.  PORTABLE LEFT KNEE - 1-2 VIEW  Comparison: 02/10/2012  Findings: Changes of left knee replacement.  No hardware or bony complicating feature.  Soft tissue drain in place.  IMPRESSION: Left knee replacement.  No complicating feature.  Original Report Authenticated By: Cyndie Chime, M.D.    Medications: Scheduled Meds:   . buPROPion  450 mg Oral Daily  . busPIRone  30 mg Oral BID  . chlorthalidone  25 mg Oral Q breakfast  . docusate sodium  100 mg Oral BID  . gabapentin  300 mg Oral TID  . HYDROcodone-acetaminophen  1-2 tablet Oral Q4H  . insulin aspart  0-15 Units Subcutaneous TID WC  . lisinopril  40 mg Oral Q breakfast  . OLANZapine  30 mg Oral QHS  . propranolol  10 mg Oral BID  . rivaroxaban  10 mg Oral Q breakfast  . verapamil  240 mg Oral QHS  . Vilazodone HCl  40 mg Oral Q supper   Continuous Infusions:   . sodium chloride 0.45 % 1,000 mL with potassium chloride 20 mEq infusion 0 mL/hr at 02/17/12 1434   PRN Meds:.acetaminophen, acetaminophen, albuterol, bisacodyl, clonazePAM,  diphenhydrAMINE, hydrALAZINE, HYDROmorphone (DILAUDID) injection, menthol-cetylpyridinium, methocarbamol (ROBAXIN) IV, methocarbamol, metoCLOPramide (REGLAN) injection, metoCLOPramide, ondansetron (ZOFRAN) IV, ondansetron, phenol, polyethylene glycol   Assessment: Principal Problem:  *Hypertension, uncontrolled Active Problems:  Palpitations  S/P total knee arthroplasty, left  Two-dimensional echo shows Left ventricle: The cavity size was normal. Wall thickness was increased in a pattern of mild LVH. Systolic function was vigorous. The estimated ejection fraction was in the range of 65% to 70%. Wall motion was normal; there were no regional wall motion abnormalities. Doppler parameters are consistent with abnormal left ventricular relaxation (grade 1 diastolic dysfunction). - Left atrium: The atrium was mildly dilated.   Plan:  #1 hypertension currently on chlorthalidone, lisinopril, propranolol, verapamil at dosages that she takes at home. Continue these meds with the same dosage, this is the same as her outpatient regimen. No new prescriptions given #2 anemia status post surgery, stable at around 11.4  #3 history of palpitations continue propranolol previous workup has been negative  #4 diabetes, CBG control continue Accu-Cheks, resume metformin upon discharge   LOS: 2 days   [facility] 02/18/2012, 10:25 AM

## 2012-02-18 NOTE — Progress Notes (Signed)
Subjective: 2 Days Post-Op Procedure(s) (LRB): TOTAL KNEE ARTHROPLASTY (Left) Patient reports pain as mild.   No c/o.  Working well with PT.  Objective: Vital signs in last 24 hours: Temp:  [98.2 F (36.8 C)-98.7 F (37.1 C)] 98.2 F (36.8 C) (05/25 0532) Pulse Rate:  [72-94] 88  (05/25 0532) Resp:  [14-20] 20  (05/25 0532) BP: (106-154)/(72-89) 133/76 mmHg (05/25 0532) SpO2:  [90 %-96 %] 96 % (05/25 0532)  Intake/Output from previous day: 05/24 0701 - 05/25 0700 In: 840 [P.O.:840] Out: 1625 [Urine:1625] Intake/Output this shift:     Basename 02/18/12 0432 02/17/12 0424 02/16/12 1829  HGB 11.6* 11.4* 11.3*    Basename 02/18/12 0432 02/17/12 0424  WBC 11.6* 10.4  RBC 4.01 3.97  HCT 36.8 36.0  PLT 350 304    Basename 02/17/12 0424 02/16/12 1829  NA 134* 134*  K 3.5 3.5  CL 100 100  CO2 24 24  BUN 9 13  CREATININE 0.89 0.99  GLUCOSE 133* 128*  CALCIUM 8.4 8.2*   No results found for this basename: LABPT:2,INR:2 in the last 72 hours  wound CDI.  dressing changed.  Assessment/Plan: 2 Days Post-Op Procedure(s) (LRB): TOTAL KNEE ARTHROPLASTY (Left) Up with therapy  SNF early next week.   Toni Arthurs 02/18/2012, 7:50 AM

## 2012-02-18 NOTE — Progress Notes (Signed)
CSW provided list of bed offers for SNF facilities.   Weekday CSW to f/u for bed selection and further d/c planning.    Chris Narasimhan, LCSWA [facility] Coverage [phone]    

## 2012-02-18 NOTE — Progress Notes (Signed)
Physical Therapy Treatment Patient Details Name: [name] MRN: [id number] DOB: [birth year] Today's Date: 02/18/2012 Time: 6295-2841 PT Time Calculation (min): 38 min  PT Assessment / Plan / Recommendation Comments on Treatment Session  Marked improvement in pt ability to focus on and complete task    Follow Up Recommendations  Skilled nursing facility    Barriers to Discharge        Equipment Recommendations  Defer to next venue    Recommendations for Other Services OT consult  Frequency 7X/week   Plan Discharge plan remains appropriate    Precautions / Restrictions Precautions Precautions: Knee Required Braces or Orthoses: Knee Immobilizer - Left Knee Immobilizer - Left: Discontinue once straight leg raise with < 10 degree lag Restrictions Weight Bearing Restrictions: No Other Position/Activity Restrictions: WBAT   Pertinent Vitals/Pain 4/10    Mobility  Bed Mobility Bed Mobility: Supine to Sit Supine to Sit: 3: Mod assist Details for Bed Mobility Assistance: cues for sequence and use of UEs to self assist Transfers Transfers: Sit to Stand;Stand to Sit Sit to Stand: From bed;3: Mod assist;With upper extremity assist;With armrests;From chair/3-in-1 Stand to Sit: 3: Mod assist;To chair/3-in-1;With armrests;With upper extremity assist Details for Transfer Assistance: cues for use of UEs and for LE management Ambulation/Gait Ambulation/Gait Assistance: 4: Min assist;3: Mod assist Ambulation Distance (Feet): 52 Feet (52' and 15') Assistive device: Rolling walker Ambulation/Gait Assistance Details: cues for sequence, posture and position from RW Gait Pattern: Step-to pattern;Decreased stance time - right;Decreased stride length;Trunk flexed Stairs: No Wheelchair Mobility Wheelchair Mobility: No    Exercises Total Joint Exercises Ankle Circles/Pumps: 20 reps;AAROM;Supine;Both Quad Sets: AROM;20 reps;Both;Supine Heel Slides: AAROM;20 reps;Left;Supine Straight  Leg Raises: AAROM;20 reps;Left;Supine   PT Diagnosis:    PT Problem List:   PT Treatment Interventions:     PT Goals Acute Rehab PT Goals PT Goal Formulation: With patient Time For Goal Achievement: 02/21/12 Potential to Achieve Goals: Good Pt will go Supine/Side to Sit: with supervision PT Goal: Supine/Side to Sit - Progress: Progressing toward goal Pt will go Sit to Supine/Side: with supervision PT Goal: Sit to Supine/Side - Progress: Progressing toward goal Pt will go Sit to Stand: with supervision PT Goal: Sit to Stand - Progress: Progressing toward goal Pt will go Stand to Sit: with supervision PT Goal: Stand to Sit - Progress: Progressing toward goal Pt will Ambulate: 51 - 150 feet;with supervision;with rolling walker PT Goal: Ambulate - Progress: Progressing toward goal  Visit Information  Last PT Received On: 02/18/12 Assistance Needed: +1    Subjective Data  Subjective: I'm doing better than yesterday Patient Stated Goal: Reseme previous lifestyle with decreased pain   Cognition  Overall Cognitive Status: Appears within functional limits for tasks assessed/performed Arousal/Alertness: Awake/alert Orientation Level: Appears intact for tasks assessed Behavior During Session: [facility] for tasks performed    Balance     End of Session PT - End of Session Equipment Utilized During Treatment: Left knee immobilizer Activity Tolerance: Patient tolerated treatment well Patient left: in chair;with call bell/phone within reach Nurse Communication: Mobility status    Leelan Rajewski 02/18/2012, 1:14 PM

## 2012-02-19 LAB — GLUCOSE, CAPILLARY: Glucose-Capillary: 95 mg/dL (ref 70–99)

## 2012-02-19 LAB — CBC
Hemoglobin: 10.9 g/dL — ABNORMAL LOW (ref 12.0–15.0)
Platelets: 308 10*3/uL (ref 150–400)
RBC: 3.84 MIL/uL — ABNORMAL LOW (ref 3.87–5.11)
WBC: 10 10*3/uL (ref 4.0–10.5)

## 2012-02-19 MED ORDER — BLISTEX EX OINT
TOPICAL_OINTMENT | CUTANEOUS | Status: AC
  Administered 2012-02-19: 13:00:00
  Filled 2012-02-19: qty 10

## 2012-02-19 NOTE — Progress Notes (Signed)
Subjective: 3 Days Post-Op Procedure(s) (LRB): TOTAL KNEE ARTHROPLASTY (Left) Patient reports pain as moderate.    Objective: Vital signs in last 24 hours: Temp:  [98 F (36.7 C)-98.3 F (36.8 C)] 98 F (36.7 C) (05/26 0535) Pulse Rate:  [71-82] 71  (05/26 0535) Resp:  [16-17] 17  (05/26 0535) BP: (136-149)/(80-83) 136/80 mmHg (05/26 0535) SpO2:  [94 %-96 %] 94 % (05/26 0535)  Intake/Output from previous day: 05/25 0701 - 05/26 0700 In: 840 [P.O.:840] Out: 950 [Urine:950] Intake/Output this shift:     Basename 02/19/12 0420 02/18/12 0432 02/17/12 0424 02/16/12 1829  HGB 10.9* 11.6* 11.4* 11.3*    Basename 02/19/12 0420 02/18/12 0432  WBC 10.0 11.6*  RBC 3.84* 4.01  HCT 35.2* 36.8  PLT 308 350    Basename 02/17/12 0424 02/16/12 1829  NA 134* 134*  K 3.5 3.5  CL 100 100  CO2 24 24  BUN 9 13  CREATININE 0.89 0.99  GLUCOSE 133* 128*  CALCIUM 8.4 8.2*   No results found for this basename: LABPT:2,INR:2 in the last 72 hours  Incision: dressing C/D/I  Assessment/Plan: 3 Days Post-Op Procedure(s) (LRB): TOTAL KNEE ARTHROPLASTY (Left) Discharge to SNF on Monday likely Up with therapy  Jacorian Golaszewski A 02/19/2012, 8:47 AM

## 2012-02-19 NOTE — Progress Notes (Signed)
Physical Therapy Treatment Patient Details Name: [name] MRN: [id number] DOB: [birth year] Today's Date: 02/19/2012 Time: 0921-0953 PT Time Calculation (min): 32 min  PT Assessment / Plan / Recommendation Comments on Treatment Session       Follow Up Recommendations  Skilled nursing facility    Barriers to Discharge        Equipment Recommendations  Defer to next venue    Recommendations for Other Services OT consult  Frequency 7X/week   Plan Discharge plan remains appropriate    Precautions / Restrictions Precautions Precautions: Knee Required Braces or Orthoses: Knee Immobilizer - Left Knee Immobilizer - Left: Discontinue once straight leg raise with < 10 degree lag Restrictions Weight Bearing Restrictions: No Other Position/Activity Restrictions: WBAT   Pertinent Vitals/Pain 3/10    Mobility  Bed Mobility Bed Mobility: Supine to Sit Supine to Sit: 4: Min assist Transfers Transfers: Sit to Stand;Stand to Sit Sit to Stand: 4: Min assist Stand to Sit: 4: Min assist Details for Transfer Assistance: cues for hand placement and LE management Ambulation/Gait Ambulation/Gait Assistance: 4: Min assist;3: Mod assist Ambulation Distance (Feet): 75 Feet Assistive device: Rolling walker Ambulation/Gait Assistance Details: multimodal cues for posture, sequence, stride length, and position from RW Gait Pattern: Step-to pattern;Decreased stance time - right;Decreased stride length;Trunk flexed    Exercises Total Joint Exercises Ankle Circles/Pumps: 20 reps;AAROM;Supine;Both Quad Sets: AROM;20 reps;Both;Supine Heel Slides: AAROM;20 reps;Left;Supine Straight Leg Raises: AAROM;20 reps;Left;Supine   PT Diagnosis:    PT Problem List:   PT Treatment Interventions:     PT Goals Acute Rehab PT Goals PT Goal Formulation: With patient Time For Goal Achievement: 02/21/12 Potential to Achieve Goals: Good Pt will go Supine/Side to Sit: with supervision PT Goal:  Supine/Side to Sit - Progress: Progressing toward goal Pt will go Sit to Supine/Side: with supervision PT Goal: Sit to Supine/Side - Progress: Progressing toward goal Pt will go Sit to Stand: with supervision PT Goal: Sit to Stand - Progress: Progressing toward goal Pt will go Stand to Sit: with supervision PT Goal: Stand to Sit - Progress: Progressing toward goal Pt will Ambulate: 51 - 150 feet;with supervision;with rolling walker PT Goal: Ambulate - Progress: Progressing toward goal  Visit Information  Last PT Received On: 02/19/12 Assistance Needed: +1    Subjective Data  Patient Stated Goal: Reseme previous lifestyle with decreased pain   Cognition  Overall Cognitive Status: Appears within functional limits for tasks assessed/performed Arousal/Alertness: Awake/alert Orientation Level: Appears intact for tasks assessed Behavior During Session: [facility] for tasks performed Cognition - Other Comments: improvement in ability to focus on task and follow cues - some cue repetitioni required    Balance     End of Session PT - End of Session Equipment Utilized During Treatment: Left knee immobilizer Activity Tolerance: Patient tolerated treatment well Patient left: with call bell/phone within reach;in chair Nurse Communication: Mobility status CPM Left Knee CPM Left Knee: Off    Lorrane Mccay 02/19/2012, 11:37 AM

## 2012-02-19 NOTE — Progress Notes (Signed)
02/19/12 1200  PT Visit Information  Last PT Received On 02/19/12  Assistance Needed +1  PT Time Calculation  PT Start Time 1155  PT Stop Time 1214  PT Time Calculation (min) 19 min  Subjective Data  Subjective I feel good  Precautions  Precautions Knee  Required Braces or Orthoses Knee Immobilizer - Left  Knee Immobilizer - Left Discontinue once straight leg raise with < 10 degree lag  Restrictions  Other Position/Activity Restrictions WBAT  Cognition  Overall Cognitive Status Appears within functional limits for tasks assessed/performed  Arousal/Alertness Awake/alert  Orientation Level Appears intact for tasks assessed  Behavior During Session [facility] for tasks performed  Cognition - Other Comments improvement in ability to focus on task and follow cues - some cue repetitioni required  Bed Mobility  Bed Mobility Supine to Sit;Sit to Supine  Supine to Sit 4: Min assist  Sit to Supine 4: Min assist  Details for Bed Mobility Assistance cues for sequence and technique  Transfers  Transfers Sit to Stand;Stand to Sit  Sit to Stand 4: Min assist  Stand to Sit 4: Min assist  Details for Transfer Assistance cues for hand placement and LE management  Ambulation/Gait  Ambulation/Gait Assistance 4: Min assist  Ambulation Distance (Feet) 85 Feet  Assistive device Rolling walker  Ambulation/Gait Assistance Details multimodal cues for posture, sequence, stride length, and position from RW  Gait Pattern Step-to pattern;Decreased stance time - right;Decreased stride length;Trunk flexed  PT - End of Session  Equipment Utilized During Treatment Left knee immobilizer  Activity Tolerance Patient tolerated treatment well  Patient left in bed;with call bell/phone within reach;with nursing in room  Nurse Communication Mobility status  PT - Assessment/Plan  Comments on Treatment Session demonstrates little carryover/following through with cues however beter able to maintain attention to task this  pm  PT Plan Discharge plan remains appropriate  PT Frequency 7X/week  Follow Up Recommendations Skilled nursing facility  Equipment Recommended Defer to next venue  Acute Rehab PT Goals  PT Goal Formulation With patient  Time For Goal Achievement 02/21/12  Potential to Achieve Goals Good  Pt will go Supine/Side to Sit with supervision  PT Goal: Supine/Side to Sit - Progress Progressing toward goal  Pt will go Sit to Supine/Side with supervision  PT Goal: Sit to Supine/Side - Progress Progressing toward goal  Pt will go Sit to Stand with supervision  PT Goal: Sit to Stand - Progress Progressing toward goal  Pt will go Stand to Sit with supervision  PT Goal: Stand to Sit - Progress Progressing toward goal  Pt will Ambulate 51 - 150 feet;with supervision;with rolling walker  PT Goal: Ambulate - Progress Progressing toward goal

## 2012-02-20 LAB — GLUCOSE, CAPILLARY
Glucose-Capillary: 109 mg/dL — ABNORMAL HIGH (ref 70–99)
Glucose-Capillary: 93 mg/dL (ref 70–99)

## 2012-02-20 NOTE — Progress Notes (Signed)
OT Cancellation Note  Treatment cancelled today due to patient's refusal to participate. Pt reported she was cold and did not want to get OOB at this time. Will check on pt as schedule allows. Thanks,  Alba Cory 02/20/2012, 11:19 AM

## 2012-02-20 NOTE — Progress Notes (Signed)
Clinical Social Work Department CLINICAL SOCIAL WORK PLACEMENT NOTE 02/20/2012  Patient:  MILKA, WINDHOLZ  Account Number:  [id number] Admit date:  02/16/2012  Clinical Social Worker:  [name], LCSW  Date/time:  02/17/2012 02:34 PM  [phone]  Clinical Social Work is seeking post-discharge placement for this patient at the following level of care:   SKILLED NURSING   (*CSW will update this form in Epic as items are completed)   02/17/2012  Patient/family provided with [facility] Department of Clinical Social Work's list of facilities offering this level of care within the geographic area requested by the patient (or if unable, by the patient's family).  02/17/2012  Patient/family informed of their freedom to choose among providers that offer the needed level of care, that participate in Medicare, Medicaid or managed care program needed by the patient, have an available bed and are willing to accept the patient.  02/17/2012  Patient/family informed of [facility]' ownership interest in [facility], as well as of the fact that they are under no obligation to receive care at this facility.  PASARR submitted to [employer] on 02/17/2012 PASARR number received from [employer] on   FL2 transmitted to all facilities in geographic area requested by pt/family on   FL2 transmitted to all facilities within larger geographic area on   Patient informed that his/her managed care company has contracts with or will negotiate with  certain facilities, including the following:     Patient/family informed of bed offers received:  02/18/2012 Patient chooses bed at [facility] Physician recommends and patient chooses bed at    Patient to be transferred to  on   Patient to be transferred to facility by   The following physician request were entered in Epic:   Additional Comments:

## 2012-02-20 NOTE — Progress Notes (Signed)
Subjective: 4 Days Post-Op Procedure(s) (LRB): TOTAL KNEE ARTHROPLASTY (Left) Patient reports pain as mild.    Objective: Vital signs in last 24 hours: Temp:  [98.3 F (36.8 C)-98.8 F (37.1 C)] 98.8 F (37.1 C) (05/27 0516) Pulse Rate:  [73-83] 75  (05/27 0516) Resp:  [16] 16  (05/27 0516) BP: (132-162)/(83-89) 132/84 mmHg (05/27 0759) SpO2:  [94 %-96 %] 94 % (05/27 0516)  Intake/Output from previous day: 05/26 0701 - 05/27 0700 In: 720 [P.O.:720] Out: -  Intake/Output this shift: Total I/O In: 240 [P.O.:240] Out: -    Basename 02/19/12 0420 02/18/12 0432  HGB 10.9* 11.6*    Basename 02/19/12 0420 02/18/12 0432  WBC 10.0 11.6*  RBC 3.84* 4.01  HCT 35.2* 36.8  PLT 308 350   No results found for this basename: NA:2,K:2,CL:2,CO2:2,BUN:2,CREATININE:2,GLUCOSE:2,CALCIUM:2 in the last 72 hours No results found for this basename: LABPT:2,INR:2 in the last 72 hours  Incision: dressing C/D/I  Assessment/Plan: 4 Days Post-Op Procedure(s) (LRB): TOTAL KNEE ARTHROPLASTY (Left) Up with therapy SNF likely Monday  Jeremih Dearmas A 02/20/2012, 10:32 AM

## 2012-02-20 NOTE — Progress Notes (Signed)
Physical Therapy Treatment Patient Details Name: [name] MRN: [id number] DOB: 03/25/1951 Today's Date: 02/20/2012 Time: 4098-1191 PT Time Calculation (min): 29 min  PT Assessment / Plan / Recommendation Comments on Treatment Session       Follow Up Recommendations  Skilled nursing facility    Barriers to Discharge        Equipment Recommendations  Defer to next venue    Recommendations for Other Services    Frequency 7X/week   Plan Discharge plan remains appropriate    Precautions / Restrictions Precautions Precautions: Knee Required Braces or Orthoses: Knee Immobilizer - Left Knee Immobilizer - Left: Discontinue once straight leg raise with < 10 degree lag Restrictions Weight Bearing Restrictions: No Other Position/Activity Restrictions: WBAT   Pertinent Vitals/Pain     Mobility  Bed Mobility Bed Mobility: Sit to Supine Supine to Sit: 4: Min assist Details for Bed Mobility Assistance: cues for sequence and technique Transfers Transfers: Sit to Stand;Stand to Sit Sit to Stand: 4: Min assist Stand to Sit: 4: Min assist Details for Transfer Assistance: cues for hand placement and LE management Ambulation/Gait Ambulation/Gait Assistance: 4: Min assist Ambulation Distance (Feet): 122 Feet Assistive device: Rolling walker Ambulation/Gait Assistance Details: cues for stride length, sequence, posture, position from RW and saftey awareness Gait Pattern: Step-to pattern;Decreased stance time - right;Decreased stride length;Trunk flexed    Exercises Total Joint Exercises Ankle Circles/Pumps: 20 reps;AAROM;Supine;Both Quad Sets: AROM;20 reps;Both;Supine Heel Slides: AAROM;20 reps;Left;Supine Straight Leg Raises: AAROM;20 reps;Left;Supine   PT Diagnosis:    PT Problem List:   PT Treatment Interventions:     PT Goals Acute Rehab PT Goals PT Goal Formulation: With patient Time For Goal Achievement: 02/21/12 Potential to Achieve Goals: Good Pt will go  Supine/Side to Sit: with supervision PT Goal: Supine/Side to Sit - Progress: Progressing toward goal Pt will go Sit to Supine/Side: with supervision PT Goal: Sit to Supine/Side - Progress: Progressing toward goal Pt will go Sit to Stand: with supervision PT Goal: Sit to Stand - Progress: Progressing toward goal Pt will go Stand to Sit: with supervision PT Goal: Stand to Sit - Progress: Progressing toward goal Pt will Ambulate: 51 - 150 feet;with supervision;with rolling walker PT Goal: Ambulate - Progress: Progressing toward goal  Visit Information  Last PT Received On: 02/20/12 Assistance Needed: +1    Subjective Data  Subjective: I'm dozing but I'm ready Patient Stated Goal: Reseme previous lifestyle with decreased pain   Cognition  Overall Cognitive Status: Appears within functional limits for tasks assessed/performed Arousal/Alertness: Awake/alert Orientation Level: Appears intact for tasks assessed Behavior During Session: [facility] for tasks performed    Balance     End of Session PT - End of Session Equipment Utilized During Treatment: Left knee immobilizer Activity Tolerance: Patient tolerated treatment well Patient left: in bed;with call bell/phone within reach;with nursing in room Nurse Communication: Mobility status CPM Left Knee CPM Left Knee: Off    Ascencion Coye 02/20/2012, 10:35 AM

## 2012-02-20 NOTE — Progress Notes (Signed)
Physical Therapy Treatment Patient Details Name: [name] MRN: [id number] DOB: [birth year] Today's Date: 02/20/2012 Time: 7829-5621 PT Time Calculation (min): 23 min  PT Assessment / Plan / Recommendation Comments on Treatment Session       Follow Up Recommendations  Skilled nursing facility    Barriers to Discharge        Equipment Recommendations  Defer to next venue    Recommendations for Other Services OT consult  Frequency 7X/week   Plan Discharge plan remains appropriate    Precautions / Restrictions Precautions Precautions: Knee Required Braces or Orthoses: Knee Immobilizer - Left Knee Immobilizer - Left: Discontinue once straight leg raise with < 10 degree lag Restrictions Weight Bearing Restrictions: No Other Position/Activity Restrictions: WBAT   Pertinent Vitals/Pain Min c/o pain    Mobility  Bed Mobility Bed Mobility: Sit to Supine Supine to Sit: 4: Min assist Sit to Supine: 4: Min assist Details for Bed Mobility Assistance: cues for sequence and technique Transfers Transfers: Sit to Stand;Stand to Sit Sit to Stand: 4: Min assist;4: Min guard Stand to Sit: 4: Min guard Details for Transfer Assistance: cues for hand placement and LE management Ambulation/Gait Ambulation/Gait Assistance: 4: Min assist;4: Min guard Ambulation Distance (Feet): 100 Feet (x2) Assistive device: Rolling walker Ambulation/Gait Assistance Details: cues for posture, position from RW and saftey awareness Gait Pattern: Step-to pattern;Trunk flexed;Step-through pattern;Decreased stride length    Exercises Total Joint Exercises Ankle Circles/Pumps: 20 reps;AAROM;Supine;Both Quad Sets: AROM;20 reps;Both;Supine Straight Leg Raises: AAROM;20 reps;Left;Supine   PT Diagnosis:    PT Problem List:   PT Treatment Interventions:     PT Goals Acute Rehab PT Goals PT Goal Formulation: With patient Time For Goal Achievement: 02/21/12 Potential to Achieve Goals: Good Pt will go  Supine/Side to Sit: with supervision PT Goal: Supine/Side to Sit - Progress: Progressing toward goal Pt will go Sit to Supine/Side: with supervision PT Goal: Sit to Supine/Side - Progress: Progressing toward goal Pt will go Sit to Stand: with supervision PT Goal: Sit to Stand - Progress: Progressing toward goal Pt will go Stand to Sit: with supervision PT Goal: Stand to Sit - Progress: Progressing toward goal Pt will Ambulate: 51 - 150 feet;with supervision;with rolling walker PT Goal: Ambulate - Progress: Progressing toward goal  Visit Information  Last PT Received On: 02/20/12 Assistance Needed: +1    Subjective Data  Subjective: no new complaints   Cognition  Overall Cognitive Status: Appears within functional limits for tasks assessed/performed Arousal/Alertness: Awake/alert Orientation Level: Appears intact for tasks assessed Behavior During Session: [facility] for tasks performed    Balance     End of Session PT - End of Session Equipment Utilized During Treatment: Left knee immobilizer Activity Tolerance: Patient tolerated treatment well Patient left: in bed;with call bell/phone within reach;with nursing in room Nurse Communication: Mobility status    Danial Sisley 02/20/2012, 2:18 PM

## 2012-02-21 LAB — GLUCOSE, CAPILLARY
Glucose-Capillary: 89 mg/dL (ref 70–99)
Glucose-Capillary: 112 mg/dL — ABNORMAL HIGH (ref 70–99)

## 2012-02-21 MED ORDER — DSS 100 MG PO CAPS: 100.0000 mg | ORAL_CAPSULE | Freq: Two times a day (BID) | ORAL | Status: AC

## 2012-02-21 MED ORDER — METHOCARBAMOL 500 MG PO TABS: 500.0000 mg | ORAL_TABLET | Freq: Four times a day (QID) | ORAL | Status: DC | PRN

## 2012-02-21 MED ORDER — HYDROCODONE-ACETAMINOPHEN 7.5-325 MG PO TABS: 1.0000 | ORAL_TABLET | ORAL | Status: AC

## 2012-02-21 MED ORDER — RIVAROXABAN 10 MG PO TABS: 10.0000 mg | ORAL_TABLET | Freq: Every day | ORAL | Status: DC

## 2012-02-21 NOTE — Progress Notes (Signed)
CSW continues to assist with d/c planning to SNF. Eligha Bridegroom is working with [employer] for prior approval for [facility] SNF.  No authorization # available yet. CSW will continue to assist with d/c planning to [employer].  [name]  LCSW  [phone]

## 2012-02-21 NOTE — Progress Notes (Signed)
Physical Therapy Treatment Patient Details Name: [name] MRN: [id number] DOB: [birth year] Today's Date: 02/21/2012 Time: 0950-1015 PT Time Calculation (min): 25 min  PT Assessment / Plan / Recommendation Comments on Treatment Session  Pt plans to D/C to SNF for Rehab today    Follow Up Recommendations  Skilled nursing facility    Barriers to Discharge        Equipment Recommendations  Defer to next venue    Recommendations for Other Services    Frequency 7X/week   Plan Discharge plan remains appropriate    Precautions / Restrictions     Pertinent Vitals/Pain C/o 5/10 L knee pain with TE's/applied ICE and repositioned     Mobility  Transfers Transfers: Sit to Stand;Stand to Sit Sit to Stand: 4: Min guard;4: Min assist;From chair/3-in-1 Stand to Sit: 4: Min guard;4: Min assist;To chair/3-in-1 Details for Transfer Assistance: 25% VC's hand placement and increased time Ambulation/Gait Ambulation/Gait Assistance: 4: Min Environmental consultant (Feet): 115 Feet Assistive device: Rolling walker Ambulation/Gait Assistance Details: 25% VC's on proper walker to self distance and increased time Gait Pattern: Step-to pattern;Decreased stance time - left;Decreased stride length;Trunk flexed Gait velocity: requires increased time Stairs: No Wheelchair Mobility Wheelchair Mobility: No    Exercises Total Joint Exercises Ankle Circles/Pumps: AROM;10 reps;Supine;Both Quad Sets: AROM;10 reps;Supine;Both Gluteal Sets: AROM;10 reps;Supine;Both Towel Squeeze: AROM;Both;10 reps;Supine Heel Slides: AAROM;Left;20 reps;Supine Hip ABduction/ADduction: AAROM;Left;10 reps;Supine Straight Leg Raises: AAROM;Left;10 reps;Supine   PT Goals Acute Rehab PT Goals PT Goal Formulation: With patient Potential to Achieve Goals: Good Pt will go Sit to Supine/Side: with supervision PT Goal: Sit to Supine/Side - Progress: Progressing toward goal Pt will go Sit to Stand: with  supervision PT Goal: Sit to Stand - Progress: Progressing toward goal Pt will go Stand to Sit: with supervision PT Goal: Stand to Sit - Progress: Progressing toward goal Pt will Ambulate: 51 - 150 feet;with supervision;with rolling walker PT Goal: Ambulate - Progress: Progressing toward goal  Visit Information  Last PT Received On: 02/21/12 Assistance Needed: +1    Subjective Data      Cognition    drowsy   Balance   poor  End of Session PT - End of Session Equipment Utilized During Treatment: Gait belt;Left knee immobilizer Activity Tolerance: Patient tolerated treatment well Patient left: in chair;with call bell/phone within reach    Felecia Shelling  PTA [facility]  Acute  Rehab Pager     [phone]

## 2012-02-21 NOTE — Discharge Summary (Signed)
Patient ID: [name] MRN: [id number] DOB/AGE: 1950/11/21 61 y.o.  Admit date: 02/16/2012 Discharge date: 02/21/2012  Admission Diagnoses:  Principal Problem:  *Hypertension, uncontrolled Active Problems:  Palpitations  S/P total knee arthroplasty, left  Past Medical History  Diagnosis Date  . Hypertension   . Hyperlipidemia   . Obesity   . Diabetes mellitus     ORAL MEDICATION  . GERD (gastroesophageal reflux disease)     RARE- OTC ANTACID IF NEEDED--PAST HX OF ULCER  . Neuromuscular disorder     NUMBNESS/TINGLING, JABBING PAIN IN FEET--NEURONTIN HELPING  . 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  PT ALSO HAS LOWER BACK PAIN AT TIMES  . Anxiety   . Depression   . Kidney stones     PT PASSED STONES--NO KNOWN STONES AT PRESENT TIMES  . Urinary incontinence   . Dysrhythmia     PT STATES IRREGULAR HB FELT TO BE RELATED TO STRESS--NEGATIVE CARDIAC WORK UP IN 2011 WITH DR. Shirlee Latch - [facility] --NUCLEAR STRESS TEST, ECHO AND HOLTER  MONITOR RESULTS IN EPIC   . Sleep apnea 02/10/2012    STOP BANG SCORE 4   Discharge Diagnoses:  Same   Surgeries: Procedure(s): TOTAL KNEE ARTHROPLASTY on 02/16/2012   Consultants: Treatment Team:  Richarda Overlie, MD  Discharged Condition: Improved  Hospital Course: [name]  is an 60 y.o. female who was admitted 02/16/2012 for operative treatment of DJD left knee. Patient has severe unremitting pain that affects sleep, daily activities, and work/hobbies. After pre-op clearance the patient was taken to the operating room on 02/16/2012 and underwent  Procedure(s): TOTAL KNEE ARTHROPLASTY.    Patient was given perioperative antibiotics: Anti-infectives     Start     Dose/Rate Route Frequency Ordered Stop   02/16/12 1330   ceFAZolin (ANCEF) IVPB 2 g/50 mL premix        2 g 100 mL/hr over 30 Minutes Intravenous Every 6 hours 02/16/12 1223 02/17/12 0059   02/16/12 0933   polymyxin B 500,000 Units, bacitracin 50,000  Units in sodium chloride irrigation 0.9 % 500 mL irrigation  Status:  Discontinued          As needed 02/16/12 0933 02/16/12 1006           Patient was given sequential compression devices, early ambulation, and chemoprophylaxis to prevent DVT.  Patient benefited maximally from hospital stay and there were no complications. She initially did have some issues with HTN therefore medicine was consulted to assist with inpatient management.  Hospital stay was extended secondary to holiday weekend.    Recent vital signs: Patient Vitals for the past 24 hrs:  BP Temp Temp src Pulse Resp SpO2  02/21/12 0635 169/99 mmHg 97.6 F (36.4 C) Oral 83  16  98 %  02/20/12 2140 126/76 mmHg 98.5 F (36.9 C) Oral 84  16  94 %  02/20/12 1343 120/80 mmHg 98.6 F (37 C) Oral 74  18  96 %  02/20/12 0759 132/84 mmHg - - - - -     Recent laboratory studies:  [facility] 02/19/12 0420  WBC 10.0  HGB 10.9*  HCT 35.2*  PLT 308  NA --  K --  CL --  CO2 --  BUN --  CREATININE --  GLUCOSE --  INR --  CALCIUM --     Discharge Medications:   Medication List  As of 02/21/2012  7:56 AM   STOP taking these medications  aspirin 81 MG tablet      ibuprofen 200 MG tablet         TAKE these medications         buPROPion 150 MG 24 hr tablet   Commonly known as: WELLBUTRIN XL   Take 450 mg by mouth daily.      busPIRone 15 MG tablet   Commonly known as: BUSPAR   Take 30 mg by mouth 2 (two) times daily.      CALCIUM 500 + D PO   Take 500 mg by mouth daily with breakfast.      chlorthalidone 25 MG tablet   Commonly known as: HYGROTON   Take 25 mg by mouth daily with breakfast.      cholecalciferol 1000 UNITS tablet   Commonly known as: VITAMIN D   Take 1,000 Units by mouth daily with breakfast.      clonazePAM 0.5 MG tablet   Commonly known as: KLONOPIN   Take 0.5 mg by mouth 2 (two) times daily as needed. For anxiety        DSS 100 MG Caps   Take 100 mg by mouth 2 (two) times  daily.      gabapentin 300 MG capsule   Commonly known as: NEURONTIN   Take 300 mg by mouth 3 (three) times daily.      HYDROcodone-acetaminophen 7.5-325 MG per tablet   Commonly known as: NORCO   Take 1-2 tablets by mouth every 4 (four) hours.      lisinopril 40 MG tablet   Commonly known as: PRINIVIL,ZESTRIL   Take 40 mg by mouth daily with breakfast.      metFORMIN 500 MG tablet   Commonly known as: GLUCOPHAGE   Take 500 mg by mouth 2 (two) times daily with a meal.      methocarbamol 500 MG tablet   Commonly known as: ROBAXIN   Take 1 tablet (500 mg total) by mouth every 6 (six) hours as needed.      mulitivitamin with minerals Tabs   Take 1 tablet by mouth daily with breakfast.      OLANZapine 20 MG tablet   Commonly known as: ZYPREXA   Take 30 mg by mouth at bedtime.      propranolol 10 MG tablet   Commonly known as: INDERAL   Take 10 mg by mouth 2 (two) times daily. For tremor        rivaroxaban 10 MG Tabs tablet   Commonly known as: XARELTO   Take 1 tablet (10 mg total) by mouth daily with breakfast.      verapamil 240 MG (CO) 24 hr tablet   Commonly known as: COVERA HS   Take by mouth at bedtime.      VIIBRYD 40 MG Tabs   Generic drug: Vilazodone HCl   Take 40 mg by mouth daily with supper.            Diagnostic Studies: Dg Chest 1 View  02/16/2012  *RADIOLOGY REPORT*  Clinical Data: CHF, shortness of breath, weakness.  CHEST - 1 VIEW  Comparison: 02/10/2012  Findings: Heart and mediastinal contours are within normal limits. No focal opacities or effusions.  No acute bony abnormality.  IMPRESSION: No active cardiopulmonary disease.  Original Report Authenticated By: Cyndie Chime, M.D.   Dg Chest 2 View  02/10/2012  *RADIOLOGY REPORT*  Clinical Data: Preoperative evaluation for left total knee arthroplasty.  Hypertension with diabetes.  CHEST - 2 VIEW  Comparison: None.  Findings: Heart and mediastinal contours are within normal limits. The lung fields  appear clear with no signs of focal infiltrate or congestive failure.  No pleural fluid or significant peribronchial cuffing is seen.  Bony structures appear intact.  Surgical clips are identified in the region of the gastroesophageal junction.  IMPRESSION: No worrisome focal or acute cardiopulmonary abnormality noted  Original Report Authenticated By: Bertha Stakes, M.D.   Dg Knee 1-2 Views Left  02/10/2012  *RADIOLOGY REPORT*  Clinical Data: Preoperative radiograph.  Left total knee arthroplasty.  LEFT KNEE - 1-2 VIEW  Comparison: None.  Findings: Anatomic alignment.  Medial compartment joint space narrowing.  Moderate medial compartment osteoarthritis.  Mild lateral compartment osteoarthritis.  Moderate patellofemoral osteoarthritis.  Small degenerative effusion.  Popliteal atherosclerosis. No fracture.  IMPRESSION: Moderate medial and patellofemoral osteoarthritis with small likely degenerative effusion.  Original Report Authenticated By: Andreas Newport, M.D.   X-ray Knee Left Port  02/16/2012  *RADIOLOGY REPORT*  Clinical Data: Postop knee replacement.  PORTABLE LEFT KNEE - 1-2 VIEW  Comparison: 02/10/2012  Findings: Changes of left knee replacement.  No hardware or bony complicating feature.  Soft tissue drain in place.  IMPRESSION: Left knee replacement.  No complicating feature.  Original Report Authenticated By: Cyndie Chime, M.D.    Disposition: To SNF  Discharge Orders    Future Orders Please Complete By Expires   Diet - low sodium heart healthy      Call MD / Call 911      Comments:   If you experience chest pain or shortness of breath, CALL 911 and be transported to the hospital emergency room.  If you develope a fever above 101 F, pus (white drainage) or increased drainage or redness at the wound, or calf pain, call your surgeon's office.   Constipation Prevention      Comments:   Drink plenty of fluids.  Prune juice may be helpful.  You may use a stool softener, such as  Colace (over the counter) 100 mg twice a day.  Use MiraLax (over the counter) for constipation as needed.   Increase activity slowly as tolerated      Discharge instructions      Comments:   Use knee immobilizer until can SLR x 10 Elevate above heart level at least 6 x a day for 20 minutes Daily dressing change  Keep wound clean and dry OK to shower     TED hose      Comments:   Use stockings (TED hose) for 4 weeks on bilateral leg(s).  You may remove them at night for sleeping.   Change dressing      Comments:   change the dressing daily with sterile 4 x 4 inch gauze dressing and apply TED hose.      Follow-up Information    Follow up with BEANE,JEFFREY C, MD in 10 days. (as scheduled)    Contact information:   [facility] [address], [address] [place] [address] [phone]           Signed: Liam Graham. 02/21/2012, 7:56 AM

## 2012-02-21 NOTE — Progress Notes (Signed)
CSW contacted [facility]igh[facility] Bridegroom [place] to assist with D/C planning. SNF has not yet received prior approval for placement from [facility]. CSW will contact SNF in am for update on insurance progress.  [name]  LCSW [phone]

## 2012-02-22 DIAGNOSIS — E1165 Type 2 diabetes mellitus with hyperglycemia: Secondary | ICD-10-CM

## 2012-02-22 DIAGNOSIS — I10 Essential (primary) hypertension: Secondary | ICD-10-CM

## 2012-02-22 LAB — GLUCOSE, CAPILLARY
Glucose-Capillary: 104 mg/dL — ABNORMAL HIGH (ref 70–99)
Glucose-Capillary: 91 mg/dL (ref 70–99)
Glucose-Capillary: 91 mg/dL (ref 70–99)

## 2012-02-22 NOTE — Progress Notes (Addendum)
Subjective: Complained of left knee pain where she had the surgery. No other specific complaints.  Objective: Vital signs in last 24 hours: Filed Vitals:   02/20/12 2140 02/21/12 0635 02/21/12 2150 02/22/12 0552  BP: 126/76 169/99 112/72 129/84  Pulse: 84 83 77 72  Temp: 98.5 F (36.9 C) 97.6 F (36.4 C) 98.4 F (36.9 C) 98.1 F (36.7 C)  TempSrc: Oral Oral    Resp: 16 16 16 16   Height:      Weight:      SpO2: 94% 98% 95% 98%   Weight change:   Intake/Output Summary (Last 24 hours) at 02/22/12 0912 Last data filed at 02/22/12 0552  Gross per 24 hour  Intake   1100 ml  Output      0 ml  Net   1100 ml    Physical Exam: General: Awake, Oriented, No acute distress. HEENT: EOMI. Neck: Supple CV: S1 and S2 Lungs: Clear to ascultation bilaterally Abdomen: Soft, Nontender, Nondistended, +bowel sounds. Ext: Good pulses. Trace edema. Bandage over left knee.  Lab Results: No results found for this basename: NA:2,K:2,CL:2,CO2:2,GLUCOSE:2,BUN:2,CREATININE:2,CALCIUM:2,MG:2,PHOS:2 in the last 72 hours No results found for this basename: AST:2,ALT:2,ALKPHOS:2,BILITOT:2,PROT:2,ALBUMIN:2 in the last 72 hours No results found for this basename: LIPASE:2,AMYLASE:2 in the last 72 hours No results found for this basename: WBC:2,NEUTROABS:2,HGB:2,HCT:2,MCV:2,PLT:2 in the last 72 hours No results found for this basename: CKTOTAL:3,CKMB:3,CKMBINDEX:3,TROPONINI:3 in the last 72 hours No components found with this basename: POCBNP:3 No results found for this basename: DDIMER:2 in the last 72 hours No results found for this basename: HGBA1C:2 in the last 72 hours No results found for this basename: CHOL:2,HDL:2,LDLCALC:2,TRIG:2,CHOLHDL:2,LDLDIRECT:2 in the last 72 hours No results found for this basename: TSH,T4TOTAL,FREET3,T3FREE,THYROIDAB in the last 72 hours No results found for this basename: VITAMINB12:2,FOLATE:2,FERRITIN:2,TIBC:2,IRON:2,RETICCTPCT:2 in the last 72 hours  Micro  Results: No results found for this or any previous visit (from the past 240 hour(s)).  Studies/Results: No results found.  Medications: I have reviewed the patient's current medications. Scheduled Meds:   . buPROPion  450 mg Oral Daily  . busPIRone  30 mg Oral BID  . chlorthalidone  25 mg Oral Q breakfast  . docusate sodium  100 mg Oral BID  . gabapentin  300 mg Oral TID  . HYDROcodone-acetaminophen  1-2 tablet Oral Q4H  . insulin aspart  0-15 Units Subcutaneous TID WC  . lisinopril  40 mg Oral Q breakfast  . OLANZapine  30 mg Oral QHS  . propranolol  10 mg Oral BID  . rivaroxaban  10 mg Oral Q breakfast  . verapamil  240 mg Oral QHS  . Vilazodone HCl  40 mg Oral Q supper   Continuous Infusions:   . sodium chloride 0.45 % 1,000 mL with potassium chloride 20 mEq infusion 0 mL/hr at 02/17/12 1434   PRN Meds:.acetaminophen, acetaminophen, albuterol, bisacodyl, clonazePAM, diphenhydrAMINE, hydrALAZINE, HYDROmorphone (DILAUDID) injection, menthol-cetylpyridinium, methocarbamol (ROBAXIN) IV, methocarbamol, metoCLOPramide (REGLAN) injection, metoCLOPramide, ondansetron (ZOFRAN) IV, ondansetron, phenol, polyethylene glycol  Assessment/Plan: S/p total left knee arthroplasty Per ortho.  Hypertension Stable, currently on chlorthalidone, lisinopril, propranolol, and verapamil. Continue these meds with the same dosage, this is the same as her outpatient regimen.   Anemia Due to blood loss from surgery, stable.  History of palpitations Continue propranolol, previous workup has been negative.  Diabetes CBG AC and HS. Continue metformin.   Depression Continue home medications. Stable.  Thank you for the consult. Will sign-off, please do not hesitate to call with any questions or concerns.  LOS: 6 days  Mercedees Convery A, MD 02/22/2012, 9:12 AM

## 2012-02-22 NOTE — Progress Notes (Signed)
Occupational Therapy Treatment Patient Details Name: [name] MRN: [id number] DOB: Feb 02, 1951 Today's Date: 02/22/2012 Time: 4098-1191 OT Time Calculation (min): 25 min  OT Assessment / Plan / Recommendation Comments on Treatment Session Pt is progressing in ADL independence.  Will benefit from SNF for STrehab to reinforce skills and address IADL as pt lives alone.    Follow Up Recommendations  Skilled nursing facility    Barriers to Discharge       Equipment Recommendations  Defer to next venue    Recommendations for Other Services    Frequency Min 1X/week   Plan Discharge plan remains appropriate    Precautions / Restrictions Precautions Precautions: Knee;Fall Required Braces or Orthoses: Knee Immobilizer - Left Knee Immobilizer - Left: Discontinue once straight leg raise with < 10 degree lag   Pertinent Vitals/Pain     ADL  Lower Body Bathing: Simulated;Moderate assistance;Other (comment) (instructed in use of AE) Where Assessed - Lower Body Bathing: Supported sit to stand Lower Body Dressing: Simulated;Moderate assistance;Other (comment) (instructed in use of AE) Where Assessed - Lower Body Dressing: Unsupported sitting ADL Comments: Discussed with pt safety when choosing shoes, use of 3 in 1 in shower and over her toilet.    OT Diagnosis:    OT Problem List:   OT Treatment Interventions:     OT Goals Acute Rehab OT Goals OT Goal Formulation: With patient ADL Goals Pt Will Perform Lower Body Bathing: with min assist;Sit to stand from chair;Sit to stand from bed ADL Goal: Lower Body Bathing - Progress: Progressing toward goals Pt Will Perform Lower Body Dressing: with min assist;Sit to stand from chair;Sit to stand from bed ADL Goal: Lower Body Dressing - Progress: Progressing toward goals Additional ADL Goal #1: pt will transfer to EOB in prep for ADl with supervision ADL Goal: Additional Goal #1 - Progress: Progressing toward goals  Visit Information  Last OT Received On: 02/22/12 Assistance Needed: +1    Subjective Data      Prior Functioning       Cognition  Overall Cognitive Status: Appears within functional limits for tasks assessed/performed Arousal/Alertness: Awake/alert Orientation Level: Appears intact for tasks assessed Behavior During Session: [facility] for tasks performed    Mobility Bed Mobility Bed Mobility: Sit to Supine Supine to Sit: 4: Min assist Sit to Supine: 4: Min assist   Exercises    Balance    End of Session OT - End of Session Activity Tolerance: Patient tolerated treatment well Patient left: in bed;with call bell/phone within reach   Evern Bio 02/22/2012, 3:19 PM

## 2012-02-23 LAB — CBC
HCT: 34.9 % — ABNORMAL LOW (ref 36.0–46.0)
MCH: 29 pg (ref 26.0–34.0)
MCV: 92.1 fL (ref 78.0–100.0)
RDW: 15.1 % (ref 11.5–15.5)
WBC: 13.3 10*3/uL — ABNORMAL HIGH (ref 4.0–10.5)

## 2012-02-23 LAB — GLUCOSE, CAPILLARY

## 2012-02-23 MED ORDER — METHOCARBAMOL 500 MG PO TABS: 500.0000 mg | ORAL_TABLET | Freq: Four times a day (QID) | ORAL | Status: AC | PRN

## 2012-02-23 MED ORDER — RIVAROXABAN 10 MG PO TABS: 10.0000 mg | ORAL_TABLET | Freq: Every day | ORAL | Status: DC

## 2012-02-23 NOTE — Progress Notes (Signed)
CSW assisting with d/c planning. Awaiting return call from Eligha Bridegroom to check status of [employer] authorization. Will update pt/care team once more info is available.  [name]  LCSW [phone]

## 2012-02-23 NOTE — Progress Notes (Signed)
Physical Therapy Treatment Patient Details Name: [name] MRN: [id number] DOB: Feb 04, 1951 Today's Date: 02/23/2012 Time: 1478-2956 PT Time Calculation (min): 15 min  PT Assessment / Plan / Recommendation Comments on Treatment Session  Pt given handout on TKR TE's HEP and performed/instructed.  Also instructed on use of ICE and proper positioning to promote knee ext.    Follow Up Recommendations  Skilled nursing facility;Other (comment) (Insurance has denied so pt now going home)    Barriers to Discharge        Equipment Recommendations  Rolling walker with 5" wheels    Recommendations for Other Services    Frequency 7X/week   Plan Discharge plan remains appropriate    Precautions / Restrictions   WBAT  Pertinent Vitals/Pain C/o 4/10 knee pain with TE's       Exercises Total Joint Exercises Ankle Circles/Pumps: AROM;Both;10 reps;Seated Quad Sets: AROM;Both;10 reps;Seated Gluteal Sets: AROM;Both;10 reps;Seated Towel Squeeze: AROM;Both;10 reps;Seated Short Arc Quad: AAROM;Left;10 reps;Seated Heel Slides: AROM;Left;10 reps;Supine Hip ABduction/ADduction: AROM;Left;10 reps;Supine Straight Leg Raises: AROM;Left;10 reps;Supine    PT Goals                                                           progressing    Visit Information  Last PT Received On: 02/23/12 Assistance Needed: +1    Subjective Data  Subjective: I'm going home today Patient Stated Goal: walk normal   Cognition    impaired safety cognition   Balance   fair  End of Session PT - End of Session Equipment Utilized During Treatment: Gait belt Activity Tolerance: Patient tolerated treatment well Patient left: in chair;with call bell/phone within reach CPM Left Knee CPM Left Knee: Off    Felecia Shelling  PTA [facility]  Acute  Rehab Pager     [phone]

## 2012-02-23 NOTE — Progress Notes (Signed)
CSW received call from [name] this am. [name] has denied [facility] SNF admission. Pt is aware and will return home with [name]. RNCM is assisting with d/c planning to home. Pt has declined non emergency ambulance transport home and is trying to arrange family/friend transport home. Cab transport is not recommended since assistance out of car, up 2 steps, and assistance with belongings is needed. D/C is planned for today.  [name]  LCSW [phone]

## 2012-02-23 NOTE — Progress Notes (Signed)
Subjective: 7 Days Post-Op Procedure(s) (LRB): TOTAL KNEE ARTHROPLASTY (Left) Patient reports pain as mild.   Denies CP or SOB.  Voiding without difficulty. Positive flatus. There was a delay in discharge secondary to insurance.  Patient does not qualify for SNF since she has done so well with PT therefore plans have been made for home health Objective: Vital signs in last 24 hours: Temp:  [97.7 F (36.5 C)-98.1 F (36.7 C)] 97.7 F (36.5 C) (05/30 0620) Pulse Rate:  [70-87] 70  (05/30 0620) Resp:  [16] 16  (05/30 0620) BP: (115-151)/(75-81) 151/79 mmHg (05/30 0620) SpO2:  [95 %-97 %] 97 % (05/30 0620)  Intake/Output from previous day: 05/29 0701 - 05/30 0700 In: 1110 [P.O.:1110] Out: -  Intake/Output this shift: Total I/O In: 360 [P.O.:360] Out: -   No results found for this basename: HGB:5 in the last 72 hours No results found for this basename: WBC:2,RBC:2,HCT:2,PLT:2 in the last 72 hours No results found for this basename: NA:2,K:2,CL:2,CO2:2,BUN:2,CREATININE:2,GLUCOSE:2,CALCIUM:2 in the last 72 hours No results found for this basename: LABPT:2,INR:2 in the last 72 hours  Neurologically intact Neurovascular intact Sensation intact distally Incision: dressing C/D/I Compartment soft  Assessment/Plan: 7 Days Post-Op Procedure(s) (LRB): TOTAL KNEE ARTHROPLASTY (Left) Discharge home with home health  [name] R. 02/23/2012, 10:23 AM

## 2012-02-23 NOTE — Progress Notes (Signed)
Physical Therapy Treatment Patient Details Name: [name] MRN: [id number] DOB: 05-21-1951 Today's Date: 02/23/2012 Time: 1050-1100 PT Time Calculation (min): 10 min  PT Assessment / Plan / Recommendation Comments on Treatment Session  Pt now plans to D/C to Home so practiced up/down steps.  Pt still demon mild/slow safety cognition, and advised pt to have someone stay with her initially.  Pt also needs a RW.    Follow Up Recommendations  Skilled nursing facility;Other (comment) (Insurance has denied so py now going home)    Barriers to Discharge        Equipment Recommendations  Rolling walker with 5" wheels    Recommendations for Other Services    Frequency 7X/week   Plan Discharge plan remains appropriate    Precautions / Restrictions   WBAT  Pertinent Vitals/Pain C/o "soreness"    Mobility  Bed Mobility Details for Bed Mobility Assistance: Pt OOB in recliner Transfers Transfers: Sit to Stand;Stand to Sit Sit to Stand: 4: Min guard;From chair/3-in-1;With upper extremity assist Stand to Sit: 4: Min guard;To chair/3-in-1 Details for Transfer Assistance: 25% VC's on proper technique and hand placement Ambulation/Gait Ambulation/Gait Assistance: 4: Min guard Ambulation Distance (Feet): 55 Feet Assistive device: Rolling walker Ambulation/Gait Assistance Details: 25% VC's on safety with turns and backward gait sequencing Gait Pattern: Step-to pattern;Decreased stance time - left;Trunk flexed Gait velocity: requires increased time Stairs: Yes Stairs Assistance: 4: Min guard Stair Management Technique: Two rails Number of Stairs: 2  Wheelchair Mobility Wheelchair Mobility: No       Visit Information  Last PT Received On: 02/23/12    Subjective Data  Subjective: I'm going home today Patient Stated Goal: walk normal   Cognition    impaired safety cognition   Balance   fair  End of Session PT - End of Session Equipment Utilized During Treatment: Gait  belt Activity Tolerance: Patient tolerated treatment well Patient left: in chair;with call bell/phone within reach CPM Left Knee CPM Left Knee: Off    Felecia Shelling  PTA [facility]  Acute  Rehab Pager     [phone]

## 2012-05-13 ENCOUNTER — Ambulatory Visit: Payer: Self-pay | Admitting: Emergency Medicine

## 2012-05-13 VITALS — BP 133/75 | HR 67 | Temp 98.2°F | Resp 20 | Ht 63.5 in | Wt 225.0 lb

## 2012-05-13 DIAGNOSIS — M722 Plantar fascial fibromatosis: Secondary | ICD-10-CM

## 2012-05-13 MED ORDER — NAPROXEN SODIUM 550 MG PO TABS: 550.0000 mg | ORAL_TABLET | Freq: Two times a day (BID) | ORAL | Status: AC

## 2012-05-13 NOTE — Progress Notes (Signed)
Date:  05/13/2012   Name:  [name]   DOB:  1951/02/11   MRN:  [id number] Gender: female  Age: 61 y.o.  PCP:  No primary provider on file.    Chief Complaint: Foot Pain and Knee Pain   History of Present Illness:  [name] is a 61 y.o. pleasant patient who presents with the following:  Patient has no history of injury with pain in heel on arising and improves with recumbancy and sitting.  Pain improves after she is up on her feet afterward.  No swelling, erythema, ecchymosis, deformity.   Full active and passive ROM.  Patient Active Problem List  Diagnosis  . HYPERTENSION, UNSPECIFIED  . Palpitations  . DYSPNEA ON EXERTION  . CHEST PAIN  . Hypertension, uncontrolled  . S/P total knee arthroplasty, left    Past Medical History  Diagnosis Date  . Hypertension   . Hyperlipidemia   . Obesity   . Diabetes mellitus     ORAL MEDICATION  . GERD (gastroesophageal reflux disease)     RARE- OTC ANTACID IF NEEDED--PAST HX OF ULCER  . Neuromuscular disorder     NUMBNESS/TINGLING, JABBING PAIN IN FEET--NEURONTIN HELPING  . 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  PT ALSO HAS LOWER BACK PAIN AT TIMES  . Anxiety   . Depression   . Kidney stones     PT PASSED STONES--NO KNOWN STONES AT PRESENT TIMES  . Urinary incontinence   . Dysrhythmia     PT STATES IRREGULAR HB FELT TO BE RELATED TO STRESS--NEGATIVE CARDIAC WORK UP IN 2011 WITH DR. Shirlee Latch [facility] --NUCLEAR STRESS TEST, ECHO AND HOLTER  MONITOR RESULTS IN EPIC   . Sleep apnea 02/10/2012    STOP BANG SCORE 4    Past Surgical History  Procedure Date  . Colon surgery   . Surgery for bowel obstruction and removal of part of stomach  in 2000   . Bilateral bunionectomy 1979   . Total knee arthroplasty 02/16/2012    Procedure: TOTAL KNEE ARTHROPLASTY;  Surgeon: Javier Docker, MD;  Location: [facility];  Service: Orthopedics;  Laterality: Left;    History  Substance Use Topics  .  Smoking status: Former Smoker -- 2.0 packs/day for 20 years    Types: Cigarettes    Quit date: 10/17/1988  . Smokeless tobacco: Never Used  . Alcohol Use: No    No family history on file.  Allergies  Allergen Reactions  . Zolpidem Tartrate     Reaction-hallucinations  . Amoxicillin Rash    Medication list has been reviewed and updated.  Current Outpatient Prescriptions on File Prior to Visit  Medication Sig Dispense Refill  . buPROPion (WELLBUTRIN XL) 150 MG 24 hr tablet Take 450 mg by mouth daily.      . busPIRone (BUSPAR) 15 MG tablet Take 30 mg by mouth 2 (two) times daily.       . Calcium Carbonate-Vitamin D (CALCIUM 500 + D PO) Take 500 mg by mouth daily with breakfast.      . chlorthalidone (HYGROTON) 25 MG tablet Take 25 mg by mouth daily with breakfast.      . cholecalciferol (VITAMIN D) 1000 UNITS tablet Take 1,000 Units by mouth daily with breakfast.      . clonazePAM (KLONOPIN) 0.5 MG tablet Take 0.5 mg by mouth 2 (two) times daily as needed. For anxiety       . gabapentin (NEURONTIN) 300  MG capsule Take 300 mg by mouth 3 (three) times daily.       Marland Kitchen lisinopril (PRINIVIL,ZESTRIL) 40 MG tablet Take 40 mg by mouth daily with breakfast.      . metFORMIN (GLUCOPHAGE) 500 MG tablet Take 500 mg by mouth 2 (two) times daily with a meal.      . Multiple Vitamin (MULITIVITAMIN WITH MINERALS) TABS Take 1 tablet by mouth daily with breakfast.      . propranolol (INDERAL) 10 MG tablet Take 10 mg by mouth 2 (two) times daily. For tremor       . rivaroxaban (XARELTO) 10 MG TABS tablet Take 1 tablet (10 mg total) by mouth daily with breakfast.  16 tablet  0  . verapamil (COVERA HS) 240 MG (CO) 24 hr tablet Take by mouth at bedtime.       Marland Kitchen OLANZapine (ZYPREXA) 20 MG tablet Take 30 mg by mouth at bedtime.       . Vilazodone HCl (VIIBRYD) 40 MG TABS Take 40 mg by mouth daily with supper.         Review of Systems:  As per HPI, otherwise negative.    Physical Examination: Filed  Vitals:   05/13/12 1122  BP: 133/75  Pulse: 67  Temp: 98.2 F (36.8 C)  Resp: 20   Filed Vitals:   05/13/12 1122  Height: 5' 3.5" (1.613 m)  Weight: 225 lb (102.059 kg)   Body mass index is 39.23 kg/(m^2). Ideal Body Weight: Weight in (lb) to have BMI = 25: 143.1    GEN: WDWN, NAD, Non-toxic, Alert & Oriented x 3 HEENT: Atraumatic, Normocephalic.  Ears and Nose: No external deformity. EXTR: No clubbing/cyanosis/edema NEURO: Normal gait.  PSYCH: Normally interactive. Conversant. Not depressed or anxious appearing.  Calm demeanor.  Foot:  No ecchymosis, cellulitis, wound.  No edema.  Marked point tenderness plantar surface just anterior to heel.  Full painless active and passive ROM ankle and foot  Assessment and Plan: Plantar fasciitis Anaprox Follow up with podiatry  Carmelina Dane, MD

## 2012-06-12 ENCOUNTER — Ambulatory Visit: Payer: Self-pay | Admitting: Obstetrics and Gynecology

## 2012-08-09 ENCOUNTER — Ambulatory Visit: Payer: Self-pay

## 2012-08-09 ENCOUNTER — Other Ambulatory Visit: Payer: Self-pay | Admitting: *Deleted

## 2012-08-09 ENCOUNTER — Ambulatory Visit: Payer: Self-pay | Admitting: Emergency Medicine

## 2012-08-09 VITALS — BP 126/78 | HR 75 | Temp 97.5°F | Resp 16 | Ht 64.0 in | Wt 224.0 lb

## 2012-08-09 DIAGNOSIS — R7611 Nonspecific reaction to tuberculin skin test without active tuberculosis: Secondary | ICD-10-CM

## 2012-08-09 DIAGNOSIS — J209 Acute bronchitis, unspecified: Secondary | ICD-10-CM

## 2012-08-09 DIAGNOSIS — R05 Cough: Secondary | ICD-10-CM

## 2012-08-09 MED ORDER — AZITHROMYCIN 250 MG PO TABS: ORAL_TABLET | ORAL | Status: DC

## 2012-08-09 MED ORDER — HYDROCOD POLST-CHLORPHEN POLST 10-8 MG/5ML PO LQCR
5.0000 mL | Freq: Two times a day (BID) | ORAL | Status: DC | PRN
Start: 2012-08-09 — End: 2013-10-02

## 2012-08-09 NOTE — Progress Notes (Signed)
[facility] [address] [place] [address] [phone]  Date:  08/09/2012   Name:  [name]   DOB:  [birth year]   MRN:  [id number]  PCP:  No primary provider on file.    Chief Complaint: Sore Throat, Cough and URI   History of Present Illness:  [name] is a 61 y.o. very pleasant female patient who presents with the following:  Ill with a cough a week ago.  Cough is productive purulent sputum.  No wheezing or shortness of breath.  Cough steady throughout the day.  No pnd or orthopnea.  Denies fever but has a chill from time to time.  Some mucoid postnasal drainage.  Some nausea that she attributes to swallowed drainage. Poor appetite. No hemoptysis, TB exposure, excessive fatigue, night sweats.  Patient Active Problem List  Diagnosis  . HYPERTENSION, UNSPECIFIED  . Palpitations  . DYSPNEA ON EXERTION  . CHEST PAIN  . Hypertension, uncontrolled  . S/P total knee arthroplasty, left    Past Medical History  Diagnosis Date  . Hypertension   . Hyperlipidemia   . Obesity   . Diabetes mellitus     ORAL MEDICATION  . GERD (gastroesophageal reflux disease)     RARE- OTC ANTACID IF NEEDED--PAST HX OF ULCER  . Neuromuscular disorder     NUMBNESS/TINGLING, JABBING PAIN IN FEET--NEURONTIN HELPING  . 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  PT ALSO HAS LOWER BACK PAIN AT TIMES  . Anxiety   . Depression   . Kidney stones     PT PASSED STONES--NO KNOWN STONES AT PRESENT TIMES  . Urinary incontinence   . Dysrhythmia     PT STATES IRREGULAR HB FELT TO BE RELATED TO STRESS--NEGATIVE CARDIAC WORK UP IN 2011 WITH DR. Shirlee Latch [facility] --NUCLEAR STRESS TEST, ECHO AND HOLTER  MONITOR RESULTS IN EPIC   . Sleep apnea 02/10/2012    STOP BANG SCORE 4  . Ulcer     Past Surgical History  Procedure Date  . Colon surgery   . Surgery for bowel obstruction and removal of part of stomach  in 2000   . Bilateral bunionectomy 1979    . Total knee arthroplasty 02/16/2012    Procedure: TOTAL KNEE ARTHROPLASTY;  Surgeon: Javier Docker, MD;  Location: [facility];  Service: Orthopedics;  Laterality: Left;  . Breast surgery     biopsy   . Small intestine surgery     History  Substance Use Topics  . Smoking status: Former Smoker -- 2.0 packs/day for 20 years    Types: Cigarettes    Quit date: 10/17/1988  . Smokeless tobacco: Never Used  . Alcohol Use: No    No family history on file.  Allergies  Allergen Reactions  . Zolpidem Tartrate     Reaction-hallucinations  . Amoxicillin Rash    Medication list has been reviewed and updated.  Current Outpatient Prescriptions on File Prior to Visit  Medication Sig Dispense Refill  . buPROPion (WELLBUTRIN XL) 150 MG 24 hr tablet Take 450 mg by mouth daily.      . busPIRone (BUSPAR) 15 MG tablet Take 30 mg by mouth 2 (two) times daily.       . Calcium Carbonate-Vitamin D (CALCIUM 500 + D PO) Take 500 mg by mouth daily with breakfast.      . chlorthalidone (HYGROTON) 25 MG tablet Take 25 mg by mouth daily with breakfast.      .  cholecalciferol (VITAMIN D) 1000 UNITS tablet Take 1,000 Units by mouth daily with breakfast.      . clonazePAM (KLONOPIN) 0.5 MG tablet Take 0.5 mg by mouth 2 (two) times daily as needed. For anxiety       . gabapentin (NEURONTIN) 300 MG capsule Take 300 mg by mouth 3 (three) times daily.       Marland Kitchen lisinopril (PRINIVIL,ZESTRIL) 40 MG tablet Take 40 mg by mouth daily with breakfast.      . metFORMIN (GLUCOPHAGE) 500 MG tablet Take 500 mg by mouth 2 (two) times daily with a meal.      . Multiple Vitamin (MULITIVITAMIN WITH MINERALS) TABS Take 1 tablet by mouth daily with breakfast.      . naproxen sodium (ANAPROX DS) 550 MG tablet Take 1 tablet (550 mg total) by mouth 2 (two) times daily with a meal.  40 tablet  1  . OLANZapine (ZYPREXA) 20 MG tablet Take 30 mg by mouth at bedtime.       Marland Kitchen olanzapine-FLUoxetine (SYMBYAX) 6-25 MG per capsule Take 1 capsule  by mouth every evening.      . propranolol (INDERAL) 10 MG tablet Take 10 mg by mouth 2 (two) times daily. For tremor       . rivaroxaban (XARELTO) 10 MG TABS tablet Take 1 tablet (10 mg total) by mouth daily with breakfast.  16 tablet  0  . verapamil (COVERA HS) 240 MG (CO) 24 hr tablet Take by mouth at bedtime.       . Vilazodone HCl (VIIBRYD) 40 MG TABS Take 40 mg by mouth daily with supper.         Review of Systems:  As per HPI, otherwise negative.    Physical Examination: Filed Vitals:   08/09/12 0905  BP: 126/78  Pulse: 75  Temp: 97.5 F (36.4 C)  Resp: 16   Filed Vitals:   08/09/12 0905  Height: 5\' 4"  (1.626 m)  Weight: 224 lb (101.606 kg)   Body mass index is 38.45 kg/(m^2). Ideal Body Weight: Weight in (lb) to have BMI = 25: 145.3   GEN: WDWN, moderately ill appearing, Non-toxic, A & O x 3 HEENT: Atraumatic, Normocephalic. Neck supple. No masses, No LAD. Ears and Nose: No external deformity. CV: RRR, No M/G/R. No JVD. No thrill. No extra heart sounds. PULM: CTA B, no wheezes, crackles, rhonchi. No retractions. No resp. distress. No accessory muscle use. ABD: S, NT, ND, +BS. No rebound. No HSM. EXTR: No c/c/e NEURO Normal gait.  PSYCH: Normally interactive. Conversant. Not depressed or anxious appearing.  Calm demeanor.    Assessment and Plan: Cough PPD converter told she did not require treatment with INH  CXR zpak tussionex Follow up as needed  Carmelina Dane, MD   [facility] reading (PRIMARY) by  Dr. Dareen Piano.  Negative chest.

## 2012-09-26 DIAGNOSIS — Z0271 Encounter for disability determination: Secondary | ICD-10-CM

## 2013-06-27 ENCOUNTER — Ambulatory Visit (INDEPENDENT_AMBULATORY_CARE_PROVIDER_SITE_OTHER): Payer: Medicare Other | Admitting: Family Medicine

## 2013-06-27 VITALS — BP 130/68 | HR 70 | Temp 98.1°F | Resp 18

## 2013-06-27 DIAGNOSIS — M79609 Pain in unspecified limb: Secondary | ICD-10-CM

## 2013-06-27 DIAGNOSIS — S61209A Unspecified open wound of unspecified finger without damage to nail, initial encounter: Secondary | ICD-10-CM

## 2013-06-27 NOTE — Patient Instructions (Addendum)
Protect fingertip from further injury  In the event of possible infection return for recheck  Return in about 12 days (October 14) to have the sutures removed  After the sutures are removed keep it snug Band-Aid on the fingertip for another week or so to keep from popping it back open.

## 2013-06-27 NOTE — Progress Notes (Signed)
Procedure Note: Verbal consent obtained from the patient.  Metacarpal block with 2 cc Lidocaine 2% without epinephrine.  Wound scrubbed with soap and water.  0.25cc 1% lido with epi added locally to slow bleeding.  Wound explored.  No foreign bodies or deep structure injury noted.  Attempted to approximate wound edges with 3 simple interrupted sutures..  Area cleansed and dressed.  Anticipate suture removal in 12 days

## 2013-06-27 NOTE — Progress Notes (Signed)
Subjective: Patient was in a quilting class at first [facility] and cut the tip off of her left index finger. It bled profusely. They poured sugar on it to try and get it stopped. She came on over here.  Objective: Laceration of tip of finger is noted, taking off a area just adjacent to the nail approximately 5 x 10 cm. It appears to be fairly deep centrally.  Assessment: Clean laceration of fingertip, avulsion type  Plan: Attempt to suture to approximate it a little bit.

## 2013-07-05 ENCOUNTER — Other Ambulatory Visit: Payer: Self-pay | Admitting: Obstetrics and Gynecology

## 2013-07-05 DIAGNOSIS — Z1231 Encounter for screening mammogram for malignant neoplasm of breast: Secondary | ICD-10-CM

## 2013-07-12 ENCOUNTER — Ambulatory Visit (INDEPENDENT_AMBULATORY_CARE_PROVIDER_SITE_OTHER): Payer: Medicare Other | Admitting: Physician Assistant

## 2013-07-12 VITALS — BP 128/70 | HR 68 | Temp 98.0°F | Resp 16

## 2013-07-12 DIAGNOSIS — S61209D Unspecified open wound of unspecified finger without damage to nail, subsequent encounter: Secondary | ICD-10-CM

## 2013-07-12 DIAGNOSIS — Z5189 Encounter for other specified aftercare: Secondary | ICD-10-CM

## 2013-07-12 NOTE — Progress Notes (Signed)
  Subjective:    Patient ID: [name], female    DOB: 27-Feb-1951, 62 y.o.   MRN: [id number]  Suture / Staple Removal   62 year old female presents for suture removal. DOI 06/27/13 - laceration to left index finger.  Doing well. Complains of burning pain of pad of finger that occurs intermittently.  Denies erythema, warmth, or drainage.     Review of Systems  Constitutional: Negative for fever and chills.  Skin: Positive for wound.       Objective:   Physical Exam  Constitutional: She is oriented to person, place, and time. She appears well-developed and well-nourished.  HENT:  Head: Normocephalic and atraumatic.  Right Ear: External ear normal.  Left Ear: External ear normal.  Cardiovascular: Normal rate.   Pulmonary/Chest: Effort normal.  Neurological: She is alert and oriented to person, place, and time.  Skin:  Wound of distal tip of left index finger well healing without erythema, warmth, or drainage.   Psychiatric: She has a normal mood and affect. Her behavior is normal. Judgment and thought content normal.      #3 sutures removed without difficulty. Patient tolerated well.     Assessment & Plan:  Wound, open, finger, subsequent encounter  Sutures removed.  Wound healing well Follow up as needed.

## 2013-07-18 ENCOUNTER — Ambulatory Visit
Admission: RE | Admit: 2013-07-18 | Discharge: 2013-07-18 | Disposition: A | Discharge status: Home / Self Care | Payer: Medicare Other | Source: Ambulatory Visit | Attending: Obstetrics and Gynecology | Admitting: Obstetrics and Gynecology

## 2013-07-18 DIAGNOSIS — Z1231 Encounter for screening mammogram for malignant neoplasm of breast: Secondary | ICD-10-CM

## 2013-09-17 ENCOUNTER — Ambulatory Visit (HOSPITAL_COMMUNITY)
Admission: RE | Admit: 2013-09-17 | Discharge: 2013-09-17 | Disposition: A | Discharge status: Home / Self Care | Payer: Medicare Other | Source: Ambulatory Visit | Attending: Vascular Surgery | Admitting: Vascular Surgery

## 2013-09-17 ENCOUNTER — Other Ambulatory Visit (HOSPITAL_COMMUNITY): Payer: Self-pay | Admitting: Internal Medicine

## 2013-09-17 DIAGNOSIS — R0989 Other specified symptoms and signs involving the circulatory and respiratory systems: Secondary | ICD-10-CM | POA: Insufficient documentation

## 2013-09-17 DIAGNOSIS — I6529 Occlusion and stenosis of unspecified carotid artery: Secondary | ICD-10-CM | POA: Insufficient documentation

## 2013-09-17 DIAGNOSIS — R42 Dizziness and giddiness: Secondary | ICD-10-CM | POA: Insufficient documentation

## 2013-09-17 DIAGNOSIS — R279 Unspecified lack of coordination: Secondary | ICD-10-CM | POA: Insufficient documentation

## 2013-09-20 ENCOUNTER — Ambulatory Visit
Admission: RE | Admit: 2013-09-20 | Discharge: 2013-09-20 | Disposition: A | Discharge status: Home / Self Care | Payer: Medicare Other | Source: Ambulatory Visit | Attending: Internal Medicine | Admitting: Internal Medicine

## 2013-09-20 ENCOUNTER — Other Ambulatory Visit: Payer: Self-pay | Admitting: Internal Medicine

## 2013-09-20 DIAGNOSIS — R52 Pain, unspecified: Secondary | ICD-10-CM

## 2013-09-23 ENCOUNTER — Other Ambulatory Visit (HOSPITAL_COMMUNITY): Payer: Medicare Other

## 2013-10-01 ENCOUNTER — Encounter: Payer: Self-pay | Admitting: Neurology

## 2013-10-02 ENCOUNTER — Telehealth: Payer: Self-pay | Admitting: Neurology

## 2013-10-02 ENCOUNTER — Ambulatory Visit (INDEPENDENT_AMBULATORY_CARE_PROVIDER_SITE_OTHER): Payer: Medicare Other | Admitting: Neurology

## 2013-10-02 ENCOUNTER — Encounter: Payer: Self-pay | Admitting: Neurology

## 2013-10-02 ENCOUNTER — Encounter (INDEPENDENT_AMBULATORY_CARE_PROVIDER_SITE_OTHER): Payer: Self-pay

## 2013-10-02 VITALS — BP 129/62 | HR 73 | Ht 64.5 in | Wt 249.0 lb

## 2013-10-02 DIAGNOSIS — R5383 Other fatigue: Secondary | ICD-10-CM

## 2013-10-02 DIAGNOSIS — M545 Low back pain, unspecified: Secondary | ICD-10-CM

## 2013-10-02 DIAGNOSIS — R5381 Other malaise: Secondary | ICD-10-CM

## 2013-10-02 DIAGNOSIS — R42 Dizziness and giddiness: Secondary | ICD-10-CM

## 2013-10-02 DIAGNOSIS — F331 Major depressive disorder, recurrent, moderate: Secondary | ICD-10-CM

## 2013-10-02 DIAGNOSIS — R269 Unspecified abnormalities of gait and mobility: Secondary | ICD-10-CM

## 2013-10-02 HISTORY — DX: Low back pain, unspecified: M54.50

## 2013-10-02 NOTE — Progress Notes (Signed)
Reason for visit: Dizziness, back pain  [name] is a 63 y.o. female  History of present illness:  [name] is a 63 year old right-handed white female with a history of bipolar disorder, obesity, diabetes, and a gait disorder. The patient indicates that she has had some problems with walking over the last 18 months. The walking issues have gradually worsened, and may have heightened after a left total knee replacement. The patient indicates that she has not had any falls, and she does not use an assistive device to walk. The patient however feels unsteady when she is standing, and she oftentimes has to hold onto something in order to maintain an upright posture. The patient has also had a three-month history of brief episodes of lightheaded or dizzy sensations that come and go within a fraction of a second. The episodes occur intermittently throughout the day, usually while sitting, but the episodes may occur while lying down or while upright. The patient denies any particular head position that brings on the dizziness. The patient did have some nausea and flank pain, and she was concerned that this was a kidney stone, as she has had this previously. This issue has improved, and is no longer a problem for her. The patient however, does have chronic low back pain, and she has had episodes of sciatica involving the right leg. The patient is trying to lose weight at this time. The patient also reports some issues with memory over the last 5 years, and she is being evaluated for possible sleep apnea. The patient is on vitamin B12 injections, and her recent blood work has shown good levels of vitamin B12. 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 The patient has some numbness primarily in the feet. The patient does have some problems with urinary urgency, no problems controlling the bowels. The patient is sent to this office for further  evaluation. The patient also reports intermittent episodes of feeling as if she is "going to die". These episodes have occurred on occasion, with about 5 episodes over the last 3 months. The episodes last only one or 2 minutes. The patient has recently undergone a carotid Doppler study that shows less than 40% stenosis of the carotid arteries bilaterally. The patient is felt to have a carotid bruit on the left.  Past Medical History  Diagnosis Date  . Hypertension   . Hyperlipidemia   . Obesity   . Diabetes mellitus     ORAL MEDICATION  . GERD (gastroesophageal reflux disease)     RARE- OTC ANTACID IF NEEDED--PAST HX OF ULCER  . Neuromuscular disorder     NUMBNESS/TINGLING, JABBING PAIN IN FEET--NEURONTIN HELPING  . 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  PT ALSO HAS LOWER BACK PAIN AT TIMES  . Anxiety   . Depression   . Kidney stones     PT PASSED STONES--NO KNOWN STONES AT PRESENT TIMES  . Urinary incontinence   . Dysrhythmia     PT STATES IRREGULAR HB FELT TO BE RELATED TO STRESS--NEGATIVE CARDIAC WORK UP IN 2011 WITH DR. Shir[facility] --NUCLEAR STRESS TEST, ECHO AND HOLTER  MONITOR RESULTS IN EPIC   . Sleep apnea 02/10/2012    STOP BANG SCORE 4  . Ulcer   . Abnormality of gait 10/02/2013  . Lumbago 10/02/2013  . Obesity     Past Surgical History  Procedure Laterality Date  .  Colon surgery    . Surgery for bowel obstruction and removal of part of stomach  in 2000    . Bilateral bunionectomy 1979    . Total knee arthroplasty  02/16/2012    Procedure: TOTAL KNEE ARTHROPLASTY;  Surgeon: Javier Docker, MD;  Location: [facility];  Service: Orthopedics;  Laterality: Left;  . Breast surgery      biopsy   . Small intestine surgery      Family History  Problem Relation Age of Onset  . Sleep apnea Father   . Multiple sclerosis Sister   . Multiple sclerosis Sister     Social history:  reports that she quit smoking about 24 years ago. Her smoking use  included Cigarettes. She has a 40 pack-year smoking history. She has never used smokeless tobacco. She reports that she does not drink alcohol or use illicit drugs.  Medications:  Current Outpatient Prescriptions on File Prior to Visit  Medication Sig Dispense Refill  . buPROPion (WELLBUTRIN XL) 150 MG 24 hr tablet Take 150 mg by mouth daily.       . busPIRone (BUSPAR) 15 MG tablet Take 30 mg by mouth 2 (two) times daily.       . Calcium Carbonate-Vitamin D (CALCIUM 500 + D PO) Take 600 mg by mouth daily with breakfast.       . chlorthalidone (HYGROTON) 25 MG tablet Take 25 mg by mouth daily with breakfast.      . cholecalciferol (VITAMIN D) 1000 UNITS tablet Take 1,000 Units by mouth daily with breakfast.      . metFORMIN (GLUCOPHAGE) 500 MG tablet Take 500 mg by mouth 2 (two) times daily with a meal.      . Multiple Vitamin (MULITIVITAMIN WITH MINERALS) TABS Take 1 tablet by mouth daily with breakfast.      . omeprazole (PRILOSEC) 10 MG capsule Take 10 mg by mouth daily.       No current facility-administered medications on file prior to visit.      Allergies  Allergen Reactions  . Zolpidem Tartrate     Reaction-hallucinations  . Amoxicillin Rash    ROS:  Out of a complete 14 system review of symptoms, the patient complains only of the following symptoms, and all other reviewed systems are negative.  Snoring Easy bruising Joint pain Urinary incontinence Headache, numbness, dizziness Depression, anxiety, panic attacks, decreased energy Restless legs Gait disturbance  Blood pressure 129/62, pulse 73, height 5' 4.5" (1.638 m), weight 249 lb (112.946 kg).  Physical Exam  General: The patient is alert and cooperative at the time of the examination. The patient is markedly obese.  Eyes: Pupils are equal, round, and reactive to light. Discs are flat bilaterally.  Neck: The neck is supple, no carotid bruits are noted on the right, there appears to be a bruit on the  left.  Respiratory: The respiratory examination is clear.  Cardiovascular: The cardiovascular examination reveals a regular rate and rhythm, no obvious murmurs or rubs are noted.  Neuromuscular: The patient has excellent range of motion of the low back.  Skin: Extremities are without significant edema.  Neurologic Exam  Mental status: The patient is alert and oriented x 3 at the time of the examination. The patient has apparent normal recent and remote memory, with an apparently normal attention span and concentration ability.  Cranial nerves: Facial symmetry is present. There is good sensation of the face to pinprick and soft touch bilaterally. The strength of the facial muscles and  the muscles to head turning and shoulder shrug are normal bilaterally. Speech is well enunciated, no aphasia or dysarthria is noted. Extraocular movements are full. Visual fields are full. The tongue is midline, and the patient has symmetric elevation of the soft palate. No obvious hearing deficits are noted.  Motor: The motor testing reveals 5 over 5 strength of all 4 extremities. Good symmetric motor tone is noted throughout.  Sensory: Sensory testing is intact to pinprick, soft touch, vibration sensation, and position sense on all 4 extremities, with the exception that there is a stocking pattern pinprick sensory deficit two thirds the way up the legs bilaterally. No evidence of extinction is noted.  Coordination: Cerebellar testing reveals good finger-nose-finger and heel-to-shin bilaterally.  Gait and station: Gait is slightly wide-based, limping gait on the left leg. Tandem gait is unsteady. Romberg is negative. No drift is seen.  Reflexes: Deep tendon reflexes are symmetric and normal bilaterally. The ankle jerk reflexes are well-maintained bilaterally. Toes are downgoing bilaterally.   Assessment/Plan:  1. Episodic dizziness  2. Low back pain, right leg pain  3. Possible peripheral  neuropathy  4. Gait disturbance  5. Reported memory disturbance  6. Bipolar disorder  7. Diabetes  8. History of vitamin B12 deficiency  The patient has multiple medical issues. The episodes of brief dizziness are most consistent with a medication effect, possibly related to the Saphris. This type of dizziness is most commonly seen with SSRI antidepressant medications. The patient does have risk factors for cerebrovascular disease, and the patient has a prominent family history of multiple sclerosis with MS seen in 2 sisters. The patient is concerned about this. MRI of the brain will be ordered. The patient has been told that she has a peripheral neuropathy, but the ankle jerk reflexes are well-maintained. The patient does have low back pain, and right leg discomfort. Nerve conduction studies of both legs and one arm will be done. EMG evaluation of the right leg will be done. The patient clearly does have a gait disturbance. Further blood work may be done in the future if a peripheral neuropathy is documented. The patient will followup for the EMG evaluation. The patient could potentially benefit from physical therapy for gait training in the future.  Marlan Palau MD 10/02/2013 9:57 PM  [facility] [address] [place], [place] [address]  Phone [phone] Fax [phone]

## 2013-10-02 NOTE — Patient Instructions (Signed)

## 2013-10-02 NOTE — Telephone Encounter (Signed)
Patient at check out today was reading through AVS sheet and states that her medication list needs to be corrected because there were a lot of medications on there that she is no longer taking. Patient states that she corrected the medication list with her paperwork this morning before her visit.

## 2013-10-07 ENCOUNTER — Telehealth: Payer: Self-pay | Admitting: Neurology

## 2013-10-07 DIAGNOSIS — M545 Low back pain, unspecified: Secondary | ICD-10-CM

## 2013-10-07 DIAGNOSIS — R0683 Snoring: Secondary | ICD-10-CM

## 2013-10-07 NOTE — Telephone Encounter (Signed)
I called patient. The patient has a cast on the left leg. We were planning on doing an EMG study on the right leg, but we will not be a redo nerve conduction studies of both legs. The patient will call us once the cast is off, and we will reschedule. I'll have the EMG study canceled.

## 2013-10-07 NOTE — Telephone Encounter (Signed)
Please see the prior note, we will cancel the study, the patient will call is to reschedule the EMG.

## 2013-10-07 NOTE — Telephone Encounter (Signed)
. °  Dr. Jarome Matinaniel Paterson is referring [name], 63 y.o. female, for an attended sleep study.  Wt: 251 lbs.  Ht: 65.25 in. BMI: 41.51  Diagnoses: Morbid Obesity Non Restorative Sleep Fatigue Snoring Insomnia Hypertension Hyperlipidemia Diabetes Mellitus Type II Dysrhythmia Bipolar Disorder   Medication List: Current Outpatient Prescriptions  Medication Sig Dispense Refill   asenapine (SAPHRIS) 5 MG SUBL 24 hr tablet Place 5 mg under the tongue 2 (two) times daily.       aspirin 81 MG tablet Take 81 mg by mouth daily.       buPROPion (WELLBUTRIN XL) 150 MG 24 hr tablet Take 150 mg by mouth daily.        busPIRone (BUSPAR) 15 MG tablet Take 30 mg by mouth 2 (two) times daily.        Calcium Carbonate-Vitamin D (CALCIUM 500 + D PO) Take 600 mg by mouth daily with breakfast.        capsaicin (ZOSTRIX) 0.025 % cream Apply topically 2 (two) times daily.       chlorthalidone (HYGROTON) 25 MG tablet Take 25 mg by mouth daily with breakfast.       cholecalciferol (VITAMIN D) 1000 UNITS tablet Take 1,000 Units by mouth daily with breakfast.       cyanocobalamin (,VITAMIN B-12,) 1000 MCG/ML injection Inject 1,000 mcg into the muscle every 30 (thirty) days.       gabapentin (NEURONTIN) 600 MG tablet Take 600 mg by mouth 4 (four) times daily.       lisinopril (PRINIVIL,ZESTRIL) 20 MG tablet Take 20 mg by mouth 2 (two) times daily.       lovastatin (MEVACOR) 20 MG tablet Take 20 mg by mouth at bedtime.       metFORMIN (GLUCOPHAGE) 500 MG tablet Take 500 mg by mouth 2 (two) times daily with a meal.       Multiple Vitamin (MULITIVITAMIN WITH MINERALS) TABS Take 1 tablet by mouth daily with breakfast.       omeprazole (PRILOSEC) 10 MG capsule Take 10 mg by mouth daily.       potassium chloride SA (K-DUR,KLOR-CON) 20 MEQ tablet Take 20 mEq by mouth once.       verapamil (VERELAN PM) 120 MG 24 hr capsule Take 120 mg by mouth 2 (two) times daily.       No current  facility-administered medications for this visit.    This patient presents to Dr. Jarome Matinaniel Paterson with complaint of non-restorative sleep, daytime fatigue, and snoring.  A 2013 STOP/BANG revealed a score of 4.  The patient has also recently seen Dr. Lesia SagoKeith Willis for lumbago.  She is morbidly obese with hypertension, hyperlipidemia, and diabetes.  Dr. Jarome Matinaniel Paterson would like the patient to have an attended sleep study to see if it is osa causing her insomnia and poor sleep.  Patient also admits to a strong family history of sleep apnea  Insurance:  [facility] - Prior approval is not required

## 2013-10-07 NOTE — Telephone Encounter (Signed)
PATIENT HAS CAST ON HER LEFT LEG FROM FOOT TO KNEE. SHE WANTS TO  KNOW IF EMG CAN STILL BE PERFORMED?

## 2013-10-07 NOTE — Telephone Encounter (Signed)
Patient called to state that she has a cast on her left foot from her toes to her knee and is questioning whether they can still do her nerve conduction study on Wednesday?. Please call to advise.

## 2013-10-07 NOTE — Telephone Encounter (Signed)
Patient of Dr Anne HahnWillis and Dr Eloise HarmanPaterson, GMA , referred for a sleep study.  I will order a SPLIT study , with CO2 - based on BMI over 40, on Insomnia and possible OSA/  snoring compliant. This patients sleep habits have not been documented , I reviewed her SToP BANG questionnaire ( 4) .  The patient is bipolar and on multiple medications that would not allow for a meaningful MSLT test.   We need an Epworth - Can this be done while the patient sees DR Anne HahnWillis for EMG ?  I would like for her to fill the FSS and Epworth out when in the office.

## 2013-10-09 ENCOUNTER — Encounter: Payer: Medicare Other | Admitting: Neurology

## 2013-10-22 ENCOUNTER — Telehealth: Payer: Self-pay | Admitting: *Deleted

## 2013-10-22 NOTE — Telephone Encounter (Signed)
CALLED PATIENT TO REVIEW HER MEDICATION LIST. LEFT VOICE MAIL FOR HER TO GIVE ME A CALL BACK.

## 2013-10-22 NOTE — Telephone Encounter (Signed)
Patient of Dr.Willis, now referred for Sleep study , having multiple risk factors for OSA , obesity (BMI exceds 40) and unfortunately on psycho-polypharmacy .  She has requested a review of her medication list as it was "incorrectly reflected " in her after visit summary. i will than be able to decide if /which sleep study type is appropriate for her. Forwarded to Lavonna RuaMiss Carter , CMA Thank you, CD

## 2013-10-25 ENCOUNTER — Telehealth: Payer: Self-pay | Admitting: *Deleted

## 2013-10-25 NOTE — Telephone Encounter (Signed)
Called patient to verify medication list. She says she doesn't remember which medication that was incorrect. I have put a copy of her medication list in the mail. I will give the patient a call back on next week to verify medications. Patient is in agreement with that.

## 2013-11-05 ENCOUNTER — Telehealth: Payer: Self-pay | Admitting: *Deleted

## 2013-11-05 NOTE — Telephone Encounter (Signed)
Left  Message for patient to give me a call back in regards to her medication list. It was mailed out on 1/30.

## 2013-11-06 NOTE — Telephone Encounter (Signed)
I spoke with [name] in regards to her current medications. Per patient d/c asa, calcium, potassium, zostrix, metformin and vit d. She only take 1 Saphris in the evening 5 mg. She wanted me to add 2 bc powder each am for headache.

## 2013-11-08 ENCOUNTER — Ambulatory Visit
Admission: RE | Admit: 2013-11-08 | Discharge: 2013-11-08 | Disposition: A | Discharge status: Home / Self Care | Payer: Medicare Other | Source: Ambulatory Visit | Attending: Neurology | Admitting: Neurology

## 2013-11-08 DIAGNOSIS — R269 Unspecified abnormalities of gait and mobility: Secondary | ICD-10-CM

## 2013-11-08 DIAGNOSIS — R42 Dizziness and giddiness: Secondary | ICD-10-CM

## 2013-11-08 DIAGNOSIS — M545 Low back pain, unspecified: Secondary | ICD-10-CM

## 2013-11-11 ENCOUNTER — Telehealth: Payer: Self-pay | Admitting: Neurology

## 2013-11-11 DIAGNOSIS — R269 Unspecified abnormalities of gait and mobility: Secondary | ICD-10-CM

## 2013-11-11 DIAGNOSIS — R42 Dizziness and giddiness: Secondary | ICD-10-CM

## 2013-11-11 NOTE — Telephone Encounter (Signed)
I called patient. The MRI the brain shows mild to moderate white matter changes, some of the areas are rounded in nature. The patient has multiple risk factors for stroke, and she is now on low-dose aspirin, she is to start this. The patient has a prominent family history of multiple sclerosis, and for this reason, I will get MRI evaluation the cervical spinal cord and visual evoked response tests. He

## 2013-11-14 ENCOUNTER — Telehealth: Payer: Self-pay | Admitting: Neurology

## 2013-11-14 NOTE — Telephone Encounter (Signed)
Patient is calling about the test visual potential and would like it to be explained to her a little better. Patient would like a call back. Please advise.

## 2013-11-14 NOTE — Telephone Encounter (Signed)
Patient calling back to schedule visual evoked potential test, but wants someone to give her a call and explain to her what the test is testing for. Please call patient and advise.

## 2013-11-14 NOTE — Telephone Encounter (Signed)
I called patient. I went over the MRI scan of the brain results. We are doing a demyelinating disease workup. The patient will have MRI of the cervical spine, and a visual evoked response test. The patient will go on low-dose aspirin. We likely will need to do further blood work testing as well. I'll call the patient once these studies are done.

## 2013-11-14 NOTE — Telephone Encounter (Signed)
Left message for patient to call and schedule visual evoked potential test.

## 2013-11-19 ENCOUNTER — Telehealth: Payer: Self-pay | Admitting: Neurology

## 2013-11-19 ENCOUNTER — Ambulatory Visit
Admission: RE | Admit: 2013-11-19 | Discharge: 2013-11-19 | Disposition: A | Discharge status: Home / Self Care | Payer: Medicare Other | Source: Ambulatory Visit | Attending: Neurology | Admitting: Neurology

## 2013-11-19 DIAGNOSIS — R269 Unspecified abnormalities of gait and mobility: Secondary | ICD-10-CM

## 2013-11-19 DIAGNOSIS — R42 Dizziness and giddiness: Secondary | ICD-10-CM

## 2013-11-19 NOTE — Telephone Encounter (Signed)
I called patient. The MRI of the cervical spine was notable for some facet joint arthritis, but no spinal cord lesions. The patient has had multiple white matter lesions on the MRI the brain, but she has a multitude of risk factors for small vessel disease. The patient has hypertension, dyslipidemia, and diabetes. The patient however, has a very strong family history for multiple sclerosis, found and 2 sisters. The patient has a visual evoked response test ordered, this has not yet been done. We may consider doing a lumbar puncture in the future given the family history.

## 2013-11-27 ENCOUNTER — Ambulatory Visit (INDEPENDENT_AMBULATORY_CARE_PROVIDER_SITE_OTHER): Payer: Medicare Other

## 2013-11-27 DIAGNOSIS — R0683 Snoring: Secondary | ICD-10-CM

## 2013-11-27 DIAGNOSIS — G4761 Periodic limb movement disorder: Secondary | ICD-10-CM

## 2013-11-27 DIAGNOSIS — M545 Low back pain, unspecified: Secondary | ICD-10-CM

## 2013-11-27 DIAGNOSIS — G4733 Obstructive sleep apnea (adult) (pediatric): Secondary | ICD-10-CM

## 2013-11-27 DIAGNOSIS — G479 Sleep disorder, unspecified: Secondary | ICD-10-CM

## 2013-11-27 DIAGNOSIS — R5381 Other malaise: Secondary | ICD-10-CM

## 2013-11-27 DIAGNOSIS — R5383 Other fatigue: Secondary | ICD-10-CM

## 2013-11-27 DIAGNOSIS — F331 Major depressive disorder, recurrent, moderate: Secondary | ICD-10-CM

## 2013-12-10 ENCOUNTER — Ambulatory Visit (INDEPENDENT_AMBULATORY_CARE_PROVIDER_SITE_OTHER): Payer: Medicare Other

## 2013-12-10 ENCOUNTER — Encounter (INDEPENDENT_AMBULATORY_CARE_PROVIDER_SITE_OTHER): Payer: Medicare Other

## 2013-12-10 ENCOUNTER — Ambulatory Visit (INDEPENDENT_AMBULATORY_CARE_PROVIDER_SITE_OTHER): Payer: Medicare Other | Admitting: Neurology

## 2013-12-10 ENCOUNTER — Encounter (INDEPENDENT_AMBULATORY_CARE_PROVIDER_SITE_OTHER): Payer: Self-pay

## 2013-12-10 ENCOUNTER — Telehealth: Payer: Self-pay | Admitting: Neurology

## 2013-12-10 DIAGNOSIS — R269 Unspecified abnormalities of gait and mobility: Secondary | ICD-10-CM

## 2013-12-10 DIAGNOSIS — M545 Low back pain, unspecified: Secondary | ICD-10-CM

## 2013-12-10 DIAGNOSIS — R42 Dizziness and giddiness: Secondary | ICD-10-CM

## 2013-12-10 DIAGNOSIS — H5310 Unspecified subjective visual disturbances: Secondary | ICD-10-CM

## 2013-12-10 DIAGNOSIS — R9089 Other abnormal findings on diagnostic imaging of central nervous system: Secondary | ICD-10-CM

## 2013-12-10 DIAGNOSIS — G5640 Causalgia of unspecified upper limb: Secondary | ICD-10-CM

## 2013-12-10 DIAGNOSIS — Z0289 Encounter for other administrative examinations: Secondary | ICD-10-CM

## 2013-12-10 NOTE — Procedures (Signed)
     HISTORY:  Yvette MulliganCarolyn Orozco is a 63 year old patient with a history of obesity, and low back pain. The patient reports pain down the right leg, and some numbness in both feet. The patient has a history of vitamin B12 deficiency, but she indicates that the numbness in the feet has progressed on B12 supplementation. The patient is being evaluated for possible neuropathy or a lumbosacral radiculopathy.  NERVE CONDUCTION STUDIES:  Nerve conduction studies were performed on the right upper extremity. The distal motor latencies and motor amplitudes for the median and ulnar nerves were within normal limits. The F wave latencies and nerve conduction velocities for these nerves were also normal. The sensory latencies for the median and ulnar nerves were normal.  Nerve conduction studies were performed on both lower extremities. The distal motor latencies and motor amplitudes for the peroneal and posterior tibial nerves were within normal limits. The nerve conduction velocities for these nerves were also normal. The H reflex latencies were normal. The sensory latencies for the peroneal nerves were within normal limits.   EMG STUDIES:  EMG study was performed on the right lower extremity:  The tibialis anterior muscle reveals 2 to 4K motor units with full recruitment. No fibrillations or positive waves were seen. The peroneus tertius muscle reveals 2 to 4K motor units with full recruitment. No fibrillations or positive waves were seen. The medial gastrocnemius muscle reveals 1 to 3K motor units with full recruitment. One plus fibrillations and positive waves were seen. The vastus lateralis muscle reveals 2 to 4K motor units with full recruitment. No fibrillations or positive waves were seen. The iliopsoas muscle reveals 2 to 4K motor units with full recruitment. No fibrillations or positive waves were seen. The biceps femoris muscle (long head) reveals 2 to 4K motor units with full recruitment. No  fibrillations or positive waves were seen. The lumbosacral paraspinal muscles were tested at 3 levels, and revealed no abnormalities of insertional activity at the lower level tested. Complex repetitive discharges were seen at the upper level, 1+ positive waves were seen at the middle level. There was good relaxation.   IMPRESSION:  Nerve conduction studies done on the right upper extremity and on both lower extremities were unremarkable, without evidence of a peripheral neuropathy. A small fiber neuropathy may be missed by a standard nerve conduction studies, however. Clinical correlation is required. EMG evaluation of the right lower extremity shows findings that are most consistent with a mild acute S1 radiculopathy.  Marlan Palau. Keith Danzig Macgregor MD 12/10/2013 10:47 AM  [facility] [address] [place], [place] [id number]  Phone [phone] Fax [phone]

## 2013-12-10 NOTE — Telephone Encounter (Signed)
I called patient. The visual evoked response test was unremarkable, no evidence of optic neuritis.

## 2013-12-10 NOTE — Procedures (Signed)
    History:   Yvette MulliganCarolyn Orozco is a 63 year old patient with a history of numbness in the feet. The patient has a very prominent family history of multiple sclerosis, and MRI the brain has shown evidence of multiple white matter lesions. The patient is being evaluated for possible demyelinating disease.  Description: The visual evoked response test was performed today using 32 x 32 check sizes. The absolute latencies for the N1 and the P100 wave forms were within normal limits bilaterally. The amplitudes for the P100 wave forms were also within normal limits bilaterally. The visual acuity was 20/40 OD and 20/40 OS uncorrected.  Impression:  The visual evoked response test above was within normal limits bilaterally. No evidence of conduction slowing was seen within the anterior visual pathways on either side on today's evaluation.

## 2013-12-10 NOTE — Progress Notes (Signed)
Yvette MulliganCarolyn Orozco is a 63 year old patient with a history of obesity, low back pain, right leg pain. The patient is being evaluated for a possible peripheral neuropathy given the history of numbness in both feet. The patient returns for EMG and nerve conduction study evaluation.  Nerve conduction studies done on the right arm and both legs were normal. A small fiber peripheral neuropathy cannot be excluded. EMG study of the right leg shows findings most consistent with a mild, low-grade acute S1 radiculopathy.  The patient has a history of an abnormal MRI the brain. The patient has a strong family history of multiple sclerosis. The patient will undergo a visual evoked response test today. The patient will be set up for lumbar puncture to be done in the future.  The patient will followup in 3-4 months.

## 2013-12-11 ENCOUNTER — Telehealth: Payer: Self-pay | Admitting: Neurology

## 2013-12-11 NOTE — Telephone Encounter (Signed)
Patient of Dr. Oliva Bustardohmeier's.  Please call and notify the patient that the recent sleep study did show overall mild obstructive sleep apnea. There was evidence of sleep disruption from frequent leg kicking. I would recommend and appointment with Dr. Vickey Hugerohmeier, so she can go over the details of the study and treatment options. Also, route or fax report to PCP and referring MD, if other than PCP.  Once you have spoken to patient, you can close this encounter.   Thanks,  Huston FoleySaima Soniyah Mcglory, MD, PhD [facility]

## 2013-12-12 ENCOUNTER — Encounter: Payer: Self-pay | Admitting: *Deleted

## 2013-12-12 NOTE — Telephone Encounter (Signed)
I called and spoke with the patient about her recent sleep study results. I informed the patient that the study did show overall mild obstructive sleep apnea with evidence of sleep disruption from frequent leg kicking. Dr. Frances FurbishAthar recommends making an appointment to meet with Dr. Vickey Hugerohmeier to discuss treatment options. Patient has been scheduled for December 30, 2013 at 2:00pm with an arrival time of 1:45 pm. I will fax a copy of the report to Dr. Silvano RuskPaterson's office and mail the report to the patient.

## 2013-12-23 ENCOUNTER — Ambulatory Visit
Admission: RE | Admit: 2013-12-23 | Discharge: 2013-12-23 | Disposition: A | Discharge status: Home / Self Care | Payer: Medicare Other | Source: Ambulatory Visit | Attending: Neurology | Admitting: Neurology

## 2013-12-23 ENCOUNTER — Other Ambulatory Visit: Payer: Self-pay | Admitting: Neurology

## 2013-12-23 VITALS — BP 110/41 | HR 55

## 2013-12-23 DIAGNOSIS — R0609 Other forms of dyspnea: Secondary | ICD-10-CM

## 2013-12-23 DIAGNOSIS — R269 Unspecified abnormalities of gait and mobility: Secondary | ICD-10-CM

## 2013-12-23 DIAGNOSIS — R0989 Other specified symptoms and signs involving the circulatory and respiratory systems: Secondary | ICD-10-CM

## 2013-12-23 DIAGNOSIS — M545 Low back pain, unspecified: Secondary | ICD-10-CM

## 2013-12-23 DIAGNOSIS — R9089 Other abnormal findings on diagnostic imaging of central nervous system: Secondary | ICD-10-CM

## 2013-12-23 DIAGNOSIS — R002 Palpitations: Secondary | ICD-10-CM

## 2013-12-23 DIAGNOSIS — R079 Chest pain, unspecified: Secondary | ICD-10-CM

## 2013-12-23 DIAGNOSIS — I10 Essential (primary) hypertension: Secondary | ICD-10-CM

## 2013-12-23 DIAGNOSIS — R42 Dizziness and giddiness: Secondary | ICD-10-CM

## 2013-12-23 LAB — CSF CELL COUNT WITH DIFFERENTIAL
RBC Count, CSF: 0 cu mm
TUBE #: 3
WBC, CSF: 1 cu mm (ref 0–5)

## 2013-12-23 LAB — CRYPTOCOCCAL ANTIGEN, CSF: Crypto Ag: NEGATIVE

## 2013-12-23 LAB — GLUCOSE, CSF: GLUCOSE CSF: 69 mg/dL (ref 43–76)

## 2013-12-23 LAB — PROTEIN, CSF: TOTAL PROTEIN, CSF: 49 mg/dL — AB (ref 15–45)

## 2013-12-23 NOTE — Progress Notes (Signed)
Blood drawn from right AC to go with spinal fluid for testing. Site is unremarkable and pt tolerated procedure well.

## 2013-12-23 NOTE — Discharge Instructions (Signed)

## 2013-12-25 LAB — VDRL, CSF: VDRL Quant, CSF: NONREACTIVE

## 2013-12-25 LAB — ANGIOTENSIN CONVERTING ENZYME, CSF: ACE, CSF: 7 U/L (ref <=15)

## 2013-12-28 LAB — HSV(HERPES SMPLX VRS)ABS-I+II(IGG)-CSF

## 2013-12-30 ENCOUNTER — Encounter: Payer: Self-pay | Admitting: Neurology

## 2013-12-30 ENCOUNTER — Ambulatory Visit (INDEPENDENT_AMBULATORY_CARE_PROVIDER_SITE_OTHER): Payer: Medicare Other | Admitting: Neurology

## 2013-12-30 DIAGNOSIS — R0989 Other specified symptoms and signs involving the circulatory and respiratory systems: Secondary | ICD-10-CM

## 2013-12-30 DIAGNOSIS — R0609 Other forms of dyspnea: Secondary | ICD-10-CM

## 2013-12-30 DIAGNOSIS — G629 Polyneuropathy, unspecified: Secondary | ICD-10-CM

## 2013-12-30 DIAGNOSIS — F411 Generalized anxiety disorder: Secondary | ICD-10-CM

## 2013-12-30 DIAGNOSIS — G4733 Obstructive sleep apnea (adult) (pediatric): Secondary | ICD-10-CM

## 2013-12-30 DIAGNOSIS — G4761 Periodic limb movement disorder: Secondary | ICD-10-CM | POA: Insufficient documentation

## 2013-12-30 DIAGNOSIS — G589 Mononeuropathy, unspecified: Secondary | ICD-10-CM

## 2013-12-30 DIAGNOSIS — R0683 Snoring: Secondary | ICD-10-CM

## 2013-12-30 HISTORY — DX: Obstructive sleep apnea (adult) (pediatric): G47.33

## 2013-12-30 HISTORY — DX: Periodic limb movement disorder: G47.61

## 2013-12-30 MED ORDER — ROPINIROLE HCL 0.25 MG PO TABS: 0.2500 mg | ORAL_TABLET | Freq: Every day | ORAL | Status: DC

## 2013-12-30 NOTE — Patient Instructions (Signed)
Restless Legs Syndrome Restless legs syndrome is a movement disorder. It may also be called a sensori-motor disorder.  CAUSES  No one knows what specifically causes restless legs syndrome, but it tends to run in families. It is also more common in people with low iron, in pregnancy, in people who need dialysis, and those with nerve damage (neuropathy).Some medications may make restless legs syndrome worse.Those medications include drugs to treat high blood pressure, some heart conditions, nausea, colds, allergies, and depression. SYMPTOMS Symptoms include uncomfortable sensations in the legs. These leg sensations are worse during periods of inactivity or rest. They are also worse while sitting or lying down. Individuals that have the disorder describe sensations in the legs that feel like:  Pulling.  Drawing.  Crawling.  Worming.  Boring.  Tingling.  Pins and needles.  Prickling.  Pain. The sensations are usually accompanied by an overwhelming urge to move the legs. Sudden muscle jerks may also occur. Movement provides temporary relief from the discomfort. In rare cases, the arms may also be affected. Symptoms may interfere with going to sleep (sleep onset insomnia). Restless legs syndrome may also be related to periodic limb movement disorder (PLMD). PLMD is another more common motor disorder. It also causes interrupted sleep. The symptoms from PLMD usually occur most often when you are awake. TREATMENT  Treatment for restless legs syndrome is symptomatic. This means that the symptoms are treated.   Massage and cold compresses may provide temporary relief.  Walk, stretch, or take a cold or hot bath.  Get regular exercise and a good night's sleep.  Avoid caffeine, alcohol, nicotine, and medications that can make it worse.  Do activities that provide mental stimulation like discussions, needlework, and video games. These may be helpful if you are not able to walk or  stretch. Some medications are effective in relieving the symptoms. However, many of these medications have side effects. Ask your caregiver about medications that may help your symptoms. Correcting iron deficiency may improve symptoms for some patients. Document Released: 09/02/2002 Document Revised: 12/05/2011 Document Reviewed: 12/09/2010 [facility] Patient Information 2014 [place]. Sleep Apnea  Sleep apnea is a sleep disorder characterized by abnormal pauses in breathing while you sleep. When your breathing pauses, the level of oxygen in your blood decreases. This causes you to move out of deep sleep and into light sleep. As a result, your quality of sleep is poor, and the system that carries your blood throughout your body (cardiovascular system) experiences stress. If sleep apnea remains untreated, the following conditions can develop:  High blood pressure (hypertension).  Coronary artery disease.  Inability to achieve or maintain an erection (impotence).  Impairment of your thought process (cognitive dysfunction). There are three types of sleep apnea: 1. Obstructive sleep apnea Pauses in breathing during sleep because of a blocked airway. 2. Central sleep apnea Pauses in breathing during sleep because the area of the brain that controls your breathing does not send the correct signals to the muscles that control breathing. 3. Mixed sleep apnea A combination of both obstructive and central sleep apnea. RISK FACTORS The following risk factors can increase your risk of developing sleep apnea:  Being overweight.  Smoking.  Having narrow passages in your nose and throat.  Being of older age.  Being female.  Alcohol use.  Sedative and tranquilizer use.  Ethnicity. Among individuals younger than 35 years, African Americans are at increased risk of sleep apnea. SYMPTOMS   Difficulty staying asleep.  Daytime sleepiness and fatigue.  Loss of energy.  Irritability.  Loud,  heavy snoring.  Morning headaches.  Trouble concentrating.  Forgetfulness.  Decreased interest in sex. DIAGNOSIS  In order to diagnose sleep apnea, your caregiver will perform a physical examination. Your caregiver may suggest that you take a home sleep test. Your caregiver may also recommend that you spend the night in a sleep lab. In the sleep lab, several monitors record information about your heart, lungs, and brain while you sleep. Your leg and arm movements and blood oxygen level are also recorded. TREATMENT The following actions may help to resolve mild sleep apnea:  Sleeping on your side.   Using a decongestant if you have nasal congestion.   Avoiding the use of depressants, including alcohol, sedatives, and narcotics.   Losing weight and modifying your diet if you are overweight. There also are devices and treatments to help open your airway:  Oral appliances. These are custom-made mouthpieces that shift your lower jaw forward and slightly open your bite. This opens your airway.  Devices that create positive airway pressure. This positive pressure "splints" your airway open to help you breathe better during sleep. The following devices create positive airway pressure:  Continuous positive airway pressure (CPAP) device. The CPAP device creates a continuous level of air pressure with an air pump. The air is delivered to your airway through a mask while you sleep. This continuous pressure keeps your airway open.  Nasal expiratory positive airway pressure (EPAP) device. The EPAP device creates positive air pressure as you exhale. The device consists of single-use valves, which are inserted into each nostril and held in place by adhesive. The valves create very little resistance when you inhale but create much more resistance when you exhale. That increased resistance creates the positive airway pressure. This positive pressure while you exhale keeps your airway open, making it  easier to breath when you inhale again.  Bilevel positive airway pressure (BPAP) device. The BPAP device is used mainly in patients with central sleep apnea. This device is similar to the CPAP device because it also uses an air pump to deliver continuous air pressure through a mask. However, with the BPAP machine, the pressure is set at two different levels. The pressure when you exhale is lower than the pressure when you inhale.  Surgery. Typically, surgery is only done if you cannot comply with less invasive treatments or if the less invasive treatments do not improve your condition. Surgery involves removing excess tissue in your airway to create a wider passage way. Document Released: 09/02/2002 Document Revised: 01/07/2013 Document Reviewed: 01/19/2012 [facility] Patient Information 2014 [place], [place]. Obesity Obesity is having too much body fat and a body mass index (BMI) of 30 or more. BMI is a number based on your height and weight. The number is an estimate of how much body fat you have. Obesity can happen if you eat more calories than you can burn by exercising or other activity. It can cause major health problems or emergencies.  HOME CARE  Exercise and be active as told by your doctor. Try:  Using stairs when you can.  Parking farther away from store doors.  Gardening, biking, or walking.  Eat healthy foods and drinks that are low in calories. Eat more fruits and vegetables.  Limit fast food, sweets, and snack foods that are made with ingredients that are not natural (processed food).  Eat smaller amounts of food.  Keep a journal and write down what you eat every day. Websites can  help with this.  Avoid drinking alcohol. Drink more water and drinks without calories.   Take vitamins and dietary pills (supplements) only as told by your doctor.  Try going to weight-loss support groups or classes to help lessen stress. Dieticians and counselors may also help. GET HELP RIGHT  AWAY IF:  You have chest pain or tightness.  You have trouble breathing or feel short of breath.  You feel weak or have loss of feeling (numbness) in your legs.  You feel confused or have trouble talking.  You have sudden changes in your vision. MAKE SURE YOU:  Understand these instructions.  Will watch your condition.  Will get help right away if you are not doing well or get worse. Document Released: 12/05/2011 Document Reviewed: 12/05/2011 [facility] Patient Information 2014 [place].

## 2013-12-30 NOTE — Progress Notes (Signed)
[facility]  Provider:  Melvyn Novasarmen  Darleny Sem, M D  Referring Provider: Jarome MatinPaterson, Daniel, MD Primary Care Physician:  Garlan FillersPATERSON,DANIEL G, MD  Chief Complaint  Patient presents with  . Follow-up    Room 10  . Discuss sleep study    HPI:  [name] is a 63 y.o. female  Is seen here as a referral  from Dr. Anne HahnWillis to discuss a recent sleep study .  The patient was referred by Dr. Ivery Qualeaniel Patterson, her primary care physician after she had reported complains of snoring, excessive daytime sleepiness and feeling not restored to after sleeping. Her medications were reviewed and based on the patient's body mass index of 41.4, her history of  bipolar depression, hypertension, and chronic pain. She is followed by my partner. Dr Anne HahnWillis as [facility]. She has been taking Metformin for weight loss, not for diabetes , she advised.   The study from 11-28-13, revealed only a very mild apnea index, the AHI was 5.4 per hour of sleep and in supine sleep AHI is slightly accentuated 5.8.  REM sleep accentuated her AHI to 19.5/hr.  and her oxygen nadir was 87%. For a very brief time - the  hypoxic burden was so short,  that she would not need any oxygen supplementation.   The patient reports that she normally goes to bed between 10 and 12 PM, on average it will take 20-30 minutes to fall asleep. She reports having to go to the bathroom 3-4 times at night, which interrupts her sleep. She also noted that she did better in the [place] because she felt safe. She reports a feeling of hypervigilance keeping her from allowing herself to sleep. She estimates the average sleep time per night and form between 5 and 6 hours only. The patient reports to ly down in the afternoon and sometimes will fall asleep. Normally she will not spontaneously fall asleep in the afternoons. Her night sleep is poor when she took a nap in the preceeding afternoon.  She wakes between 5 AM and 9  AM, she wakes spontaneously , doesn't need an alarm since she is not longer able to work. She is disabled.   The patient is aware that her main risk factor for OSA is her her BMI ,  and she continued to gain weight. Wellbutrin was supposed to help, but hasn't. She had a Vitamin B 12  deficiency  Without anemia and she has been injected monthly for 24 month, resumed a level of 345 Unit/  mol.  She tested also positive FREQUENT periodic limb movements. 14.6 arousals per hour of sleep, she reports however no clinical symptoms of  RLS, only neuropathic pain in her feet, controlled by Neurontin.  She is concerned about using a CPAP, which is not my preferred treatment for her degree of apnea, I would recommend an oral device and to try to reduce the PLMs with medication.  [name] reports to sleep on her side, with pain radiation down her right leg. She is reportedly snoring loudly, and snoring and PLM treatment my be all that's necessary to improve her sleep quality.   She was made aware , that her lack of a sleep routine will contribute to less sleep duration and quality   The patient reports a strong family history in her sister and father for restless legs.  Her father and sister both had OSA as well,  Her father passed away , her sister uses  CPAP.  Review of Systems: Out of a complete 14 system review, the patient complains of only the following symptoms, and all other reviewed systems are negative.  Endorsed the  GDS score and 9 points, the fatigue questionnaire at 44, Epworth sleepiness score at 14 points.   History   Social History  . Marital Status: Single    Spouse Name: N/A    Number of Children: 0  . Years of Education: 14   Occupational History  . Not on file.   Social History Main Topics  . Smoking status: Former Smoker -- 2.00 packs/day for 20 years    Types: Cigarettes    Quit date: 10/17/1988  . Smokeless tobacco: Never Used  . Alcohol Use: No  . Drug Use: No      Comment: Marijuana - 25 years ago  . Sexual Activity: Not on file   Other Topics Concern  . Not on file   Social History Narrative   Patient is single and lives alone.   Patient is disabled.   Patient has a college education.   Patient is right-handed.   Patient drinks two cokes per day.    Family History  Problem Relation Age of Onset  . Sleep apnea Father   . Multiple sclerosis Sister   . Multiple sclerosis Sister     Past Medical History  Diagnosis Date  . Hypertension   . Hyperlipidemia   . Obesity   . Diabetes mellitus     ORAL MEDICATION  . GERD (gastroesophageal reflux disease)     RARE- OTC ANTACID IF NEEDED--PAST HX OF ULCER  . Neuromuscular disorder     NUMBNESS/TINGLING, JABBING PAIN IN FEET--NEURONTIN HELPING  . 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  PT ALSO HAS LOWER BACK PAIN AT TIMES  . Anxiety   . Depression   . Kidney stones     PT PASSED STONES--NO KNOWN STONES AT PRESENT TIMES  . Urinary incontinence   . Dysrhythmia     PT STATES IRREGULAR HB FELT TO BE RELATED TO STRESS--NEGATIVE CARDIAC WORK UP IN 2011 WITH DR. Shirlee Latch - [facility] --NUCLEAR STRESS TEST, ECHO AND HOLTER  MONITOR RESULTS IN EPIC   . Sleep apnea 02/10/2012    STOP BANG SCORE 4  . Ulcer   . Abnormality of gait 10/02/2013  . Lumbago 10/02/2013  . Obesity     Past Surgical History  Procedure Laterality Date  . Colon surgery    . Surgery for bowel obstruction and removal of part of stomach  in 2000  2000  . Bilateral bunionectomy 1979    . Total knee arthroplasty  02/16/2012    Procedure: TOTAL KNEE ARTHROPLASTY;  Surgeon: Javier Docker, MD;  Location: [facility];  Service: Orthopedics;  Laterality: Left;  . Breast surgery      biopsy   . Small intestine surgery      Current Outpatient Prescriptions  Medication Sig Dispense Refill  . asenapine (SAPHRIS) 5 MG SUBL 24 hr tablet Place 5 mg under the tongue daily.       . Aspirin-Salicylamide-Caffeine (BC  HEADACHE POWDER PO) Take 2 each by mouth daily.      . busPIRone (BUSPAR) 15 MG tablet Take 30 mg by mouth 2 (two) times daily.       . chlorthalidone (HYGROTON) 25 MG tablet Take 25 mg by mouth daily with breakfast.      . cyanocobalamin (,VITAMIN B-12,) 1000 MCG/ML injection Inject  1,000 mcg into the muscle every 30 (thirty) days.      Marland Kitchen gabapentin (NEURONTIN) 600 MG tablet Take 600 mg by mouth 4 (four) times daily.      Marland Kitchen lisinopril (PRINIVIL,ZESTRIL) 20 MG tablet Take 20 mg by mouth 2 (two) times daily.      Marland Kitchen lovastatin (MEVACOR) 20 MG tablet Take 20 mg by mouth at bedtime.      Marland Kitchen omeprazole (PRILOSEC) 10 MG capsule Take 10 mg by mouth daily.      . verapamil (VERELAN PM) 120 MG 24 hr capsule Take 120 mg by mouth 2 (two) times daily.       No current facility-administered medications for this visit.    Allergies as of 12/30/2013 - Review Complete 12/23/2013  Allergen Reaction Noted  . Zolpidem tartrate Other (See Comments)   . Amoxicillin Rash     Vitals: BP 135/69  Pulse 59  Ht 5' 5.75" (1.67 m)  Wt 261 lb (118.389 kg)  BMI 42.45 kg/m2 Last Weight:  Wt Readings from Last 1 Encounters:  12/30/13 261 lb (118.389 kg)   Last Height:   Ht Readings from Last 1 Encounters:  12/30/13 5' 5.75" (1.67 m)   Physical exam:  General: The patient is awake, alert and appears not in acute distress. The patient is well groomed. Head: Normocephalic, atraumatic. Neck is supple. Mallampati 2  neck circumference: 16 inches , no TMJ and normal nasion airflow.  Cardiovascular:  Regular rate and rhythm , without  murmurs or carotid bruit, and without distended neck veins. Respiratory: Lungs are clear to auscultation. Skin:   There is evidence of ankle edema, Easy bruising , no  rash. Trunk: BMI is severly elevated. The patient  has normal posture.  Neurologic exam : The patient is awake and alert, oriented to place and time.   Memory subjective described as intact. There is a normal  attention span & concentration ability. Speech is monotonous.   Cranial nerves: Pupils are equal and briskly reactive to light. Hearing to finger rub intact.  Facial sensation intact to fine touch. Facial motor strength is symmetric and tongue and uvula move midline.  Motor exam:   Normal tone and normal muscle bulk and symmetric normal strength in all extremities.  Sensory:  Fine touch, pinprick and vibration were tested in all extremities. Proprioception normal.  Coordination: Rapid alternating movements in the fingers/hands is tested and normal, without evidence of ataxia, dysmetria or tremor.  Gait and station: Patient walks without assistive device .Deep tendon reflexes: in the  upper and lower extremities are symmetrically attenuated .   Assessment:  After physical and neurologic examination, review of laboratory studies, imaging, neurophysiology testing and pre-existing records, assessment is  1) mild OSA with REM accentuation, UARS with loud snoring. No significant oxygen desaturation.   Patient can pursue a dental device ( medicare coverage may be the issue) , to eliminate mild apnea and snoring. Should she feel , she needs PLM medication, I would prescribe a low dose of Requip, which is less likely tham Mirapex to induce eating compulsion.   OSA main risk factor is her high BMI- best goal is weight loss, and we discussed a low carb diet. She may need to see a nutrionist through PCP.    Plan:  Treatment plan and additional workup :  Dental referral through sleep lab. If no coverage found , may need to pursue CPAP . Requip trial for 30 days. Generic medication form . Follow up with Dr.  Willis. [facility] neurology . 30 minute visit.                Reason for visit: Dizziness, back pain. [name] is a 63 y.o. female  Dr Anne Hahn last note :  History of present illness: [name] is a 63 year old right-handed white female with a history of bipolar disorder, obesity,  diabetes, and a gait disorder. The patient indicates that she has had some problems with walking over the last 18 months. The walking issues have gradually worsened, and may have heightened after a left total knee replacement. The patient indicates that she has not had any falls, and she does not use an assistive device to walk. The patient however feels unsteady when she is standing, and she oftentimes has to hold onto something in order to maintain an upright posture. The patient has also had a three-month history of brief episodes of lightheaded or dizzy sensations that come and go within a fraction of a second. The episodes occur intermittently throughout the day, usually while sitting, but the episodes may occur while lying down or while upright. The patient denies any particular head position that brings on the dizziness. The patient did have some nausea and flank pain, and she was concerned that this was a kidney stone, as she has had this previously. This issue has improved, and is no longer a problem for her. The patient however, does have chronic low back pain, and she has had episodes of sciatica involving the right leg. The patient is trying to lose weight at this time. The patient also reports some issues with memory over the last 5 years, and she is being evaluated for possible sleep apnea. The patient is on vitamin B12 injections, and her recent blood work has shown good levels of vitamin B12. 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 The patient has some numbness primarily in the feet. The patient does have some problems with urinary urgency, no problems controlling the bowels. The patient is sent to this office for further evaluation. The patient also reports intermittent episodes of feeling as if she is "going to die". These episodes have occurred on occasion, with about 5 episodes over the last 3 months. The episodes last only one or 2 minutes.  The patient has recently undergone a carotid Doppler study that shows less than 40% stenosis of the carotid arteries bilaterally. The patient is felt to have a carotid bruit on the left.  A and P:  The patient has multiple medical issues. The episodes of brief dizziness are most consistent with a medication effect, possibly related to the Saphris. This type of dizziness is most commonly seen with SSRI antidepressant medications. The patient does have risk factors for cerebrovascular disease, and the patient has a prominent family history of multiple sclerosis with MS seen in 2 sisters. The patient is concerned about this. MRI of the brain will be ordered. The patient has been told that she has a peripheral neuropathy, but the ankle jerk reflexes are well-maintained. The patient does have low back pain, and right leg discomfort. Nerve conduction studies of both legs and one arm will be done. EMG evaluation of the right leg will be done. The patient clearly does have a gait disturbance. Further blood work may be done in the future if a peripheral neuropathy is documented. The patient will followup for the EMG evaluation. The patient could potentially benefit from physical therapy for gait  training in the future.  Marlan Palau MD 10/02/2013 9:57 PM  [facility] [address] [place], [place] [address]  Phone [phone] Fax [phone]

## 2014-01-02 ENCOUNTER — Telehealth: Payer: Self-pay | Admitting: Neurology

## 2014-01-02 LAB — B. BURGDORFI ANTIBODIES, CSF: Lyme Ab: NEGATIVE

## 2014-01-02 LAB — OLIGOCLONAL BANDS, CSF + SERM

## 2014-01-02 NOTE — Telephone Encounter (Signed)
I called patient. The spinal fluid analysis is unremarkable. The patient had 3 oligoclonal bands in the spinal fluid, but these were also present in the peripheral blood. Nothing to suggest the diagnosis of multiple sclerosis. We will need to follow the MRI of the brain over time.

## 2014-01-07 ENCOUNTER — Telehealth: Payer: Self-pay | Admitting: Neurology

## 2014-01-07 DIAGNOSIS — M545 Low back pain, unspecified: Secondary | ICD-10-CM

## 2014-01-07 NOTE — Telephone Encounter (Signed)
Patient called to state that she would like Dr. Anne HahnWillis to give her a call for more information about her results. Please return call.

## 2014-01-07 NOTE — Telephone Encounter (Signed)
I called patient. The patient indicates that she continues to have some numbness in the bottom of the feet, does have back pain, EMG suggested a possible S1 radiculopathy on the right. I'll check MRI of the low back. The patient also reports intermittent vertigo, etiology unclear. If this is worsening over time, vestibular rehabilitation can be done.

## 2014-01-07 NOTE — Telephone Encounter (Signed)
Patient would like for Dr. Anne HahnWillis to call her to discuss the spinal fluid results in detail.  Please advise.

## 2014-01-21 ENCOUNTER — Ambulatory Visit
Admission: RE | Admit: 2014-01-21 | Discharge: 2014-01-21 | Disposition: A | Discharge status: Home / Self Care | Payer: Medicare Other | Source: Ambulatory Visit | Attending: Neurology | Admitting: Neurology

## 2014-01-21 DIAGNOSIS — M545 Low back pain, unspecified: Secondary | ICD-10-CM

## 2014-01-23 ENCOUNTER — Telehealth: Payer: Self-pay | Admitting: Neurology

## 2014-01-23 NOTE — Telephone Encounter (Signed)
I called patient. 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 EMG evaluation suggested some involvement of the S1 nerve root. 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 I would recommend trying an epidural steroid injection first, if this is not effective, and the pain is severe, a neurosurgical referral would be indicated. The patient will let us know what she wants to do.   MRI lumbosacral spine 01/21/2014:  Impression   Abnormal MRI lumbar spine (without) demonstrating: 1. At L4-5: there is an 11x5711mm (axial measurement) right synovial cyst  which appears partially calcified, with severe facet hypertrophy and  arthropathy, fluid in the facet joints, resulting in right lateral recess  stenosis and severe right foraminal stenosis; this results in potential  impingement upon the exiting right L4 descending right L5 nerve roots 2. At L5-S1: pseudo-disc bulging, bilateral pars interarticularis defects,  posterior epidural lipomatosis, with moderate right and moderate-severe  left foraminal stenosis

## 2014-01-24 ENCOUNTER — Telehealth: Payer: Self-pay | Admitting: Neurology

## 2014-01-24 DIAGNOSIS — M545 Low back pain, unspecified: Secondary | ICD-10-CM

## 2014-01-24 NOTE — Telephone Encounter (Signed)
Pt called states she would like to be set up for the epidural steroid injection, please call pt back concerning this matter. Thanks

## 2014-01-24 NOTE — Telephone Encounter (Signed)
I called patient. We will get an epidural steroid injection set up for the low back.

## 2014-01-24 NOTE — Telephone Encounter (Signed)
Pt calling stating that she would like to be set up for an epidural steroid injection. Pt would like Dr. Vickey Hugerohmeier to give her a call back please. Thanks

## 2014-01-24 NOTE — Telephone Encounter (Signed)
Dr Anne Hahnwillis patient .

## 2014-01-27 ENCOUNTER — Other Ambulatory Visit: Payer: Self-pay | Admitting: Neurology

## 2014-01-27 DIAGNOSIS — M545 Low back pain, unspecified: Secondary | ICD-10-CM

## 2014-01-27 NOTE — Telephone Encounter (Signed)
Pt's information was given to Lamar LaundrySonya, CMA, Dr. Anne HahnWillis assistant.

## 2014-02-03 ENCOUNTER — Inpatient Hospital Stay: Admission: RE | Admit: 2014-02-03 | Payer: Medicare Other | Source: Ambulatory Visit

## 2014-02-06 ENCOUNTER — Telehealth: Payer: Self-pay | Admitting: Neurology

## 2014-02-06 ENCOUNTER — Ambulatory Visit
Admission: RE | Admit: 2014-02-06 | Discharge: 2014-02-06 | Disposition: A | Discharge status: Home / Self Care | Payer: Medicare Other | Source: Ambulatory Visit | Attending: Neurology | Admitting: Neurology

## 2014-02-06 DIAGNOSIS — M545 Low back pain, unspecified: Secondary | ICD-10-CM

## 2014-02-06 MED ORDER — IOHEXOL 180 MG/ML  SOLN
1.0000 mL | Freq: Once | INTRAMUSCULAR | Status: AC | PRN
  Administered 2014-02-06: 1 mL via EPIDURAL

## 2014-02-06 MED ORDER — METHYLPREDNISOLONE ACETATE 40 MG/ML INJ SUSP (RADIOLOG
120.0000 mg | Freq: Once | INTRAMUSCULAR | Status: AC
  Administered 2014-02-06: 120 mg via EPIDURAL

## 2014-02-06 NOTE — Discharge Instructions (Signed)

## 2014-02-06 NOTE — Telephone Encounter (Signed)
The patient had an epidural steroid injection today.  Dr. 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 Steroid injection into the facet joint space could be done in the future to help symptomatology as well. On the next injection, this procedure may be done instead of an epidural steroid injection.

## 2014-05-01 NOTE — Telephone Encounter (Signed)
Noted  

## 2014-05-27 ENCOUNTER — Other Ambulatory Visit: Payer: Self-pay | Admitting: Neurology

## 2014-07-24 ENCOUNTER — Ambulatory Visit (INDEPENDENT_AMBULATORY_CARE_PROVIDER_SITE_OTHER): Payer: Medicare Other | Admitting: Emergency Medicine

## 2014-07-24 VITALS — BP 142/80 | HR 75 | Temp 98.8°F | Resp 18 | Ht 63.5 in | Wt 259.4 lb

## 2014-07-24 DIAGNOSIS — N3000 Acute cystitis without hematuria: Secondary | ICD-10-CM

## 2014-07-24 DIAGNOSIS — N3941 Urge incontinence: Secondary | ICD-10-CM

## 2014-07-24 LAB — POCT UA - MICROSCOPIC ONLY
CASTS, UR, LPF, POC: NEGATIVE
CRYSTALS, UR, HPF, POC: NEGATIVE
MUCUS UA: NEGATIVE
YEAST UA: NEGATIVE

## 2014-07-24 LAB — POCT URINALYSIS DIPSTICK
Bilirubin, UA: NEGATIVE
Glucose, UA: NEGATIVE
KETONES UA: NEGATIVE
Nitrite, UA: NEGATIVE
PH UA: 5
PROTEIN UA: NEGATIVE
Urobilinogen, UA: 0.2

## 2014-07-24 MED ORDER — PHENAZOPYRIDINE HCL 200 MG PO TABS: 200.0000 mg | ORAL_TABLET | Freq: Three times a day (TID) | ORAL | Status: DC | PRN

## 2014-07-24 MED ORDER — CIPROFLOXACIN HCL 500 MG PO TABS: 500.0000 mg | ORAL_TABLET | Freq: Two times a day (BID) | ORAL | Status: DC

## 2014-07-24 NOTE — Patient Instructions (Signed)

## 2014-07-24 NOTE — Progress Notes (Signed)
[facility] [address], [place] [address] [phone]  Date:  07/24/2014   Name:  Yvette ShutterCarolyn I [place]   DOB:  [birth year]   MRN:  [id number]  PCP:  Garlan FillersPATERSON,DANIEL G, MD    Chief Complaint: Cystitis   History of Present Illness:  Yvette ShutterCarolyn I Diop is a 63 y.o. very pleasant female patient who presents with the following:  Has dysuria and urgency since yesterday.  Frequent urination. Finds that she has urgency incontinence. No fever or chills.  No GYN or GI symptoms No abdominal pain. No back pain.  History of kidney stones No improvement with over the counter medications or other home remedies.  Denies other complaint or health concern today.   Patient Active Problem List   Diagnosis Date Noted  . Periodic limb movement disorder (PLMD) 12/30/2013  . OSA (obstructive sleep apnea) 12/30/2013  . Dizziness and giddiness 10/02/2013  . Abnormality of gait 10/02/2013  . Lumbago 10/02/2013  . Hypertension, uncontrolled 02/17/2012  . 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 HYPERTENSION, UNSPECIFIED 12/24/2009  . Palpitations 12/24/2009  . DYSPNEA ON EXERTION 12/24/2009  . CHEST PAIN 12/24/2009    Past Medical History  Diagnosis Date  . Hypertension   . Hyperlipidemia   . Obesity   . Diabetes mellitus     ORAL MEDICATION  . GERD (gastroesophageal reflux disease)     RARE- OTC ANTACID IF NEEDED--PAST HX OF ULCER  . Neuromuscular disorder     NUMBNESS/TINGLING, JABBING PAIN IN FEET--NEURONTIN HELPING  . 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  PT ALSO HAS LOWER BACK PAIN AT TIMES  . Anxiety   . Depression   . Kidney stones     PT PASSED STONES--NO KNOWN STONES AT PRESENT TIMES  . Urinary incontinence   . Dysrhythmia     PT STATES IRREGULAR HB FELT TO BE RELATED TO STRESS--NEGATIVE CARDIAC WORK UP IN 2011 WITH DR. Shirlee [facility] --NUCLEAR STRESS TEST, ECHO AND HOLTER  MONITOR RESULTS IN EPIC   . Sleep apnea  02/10/2012    STOP BANG SCORE 4  . Ulcer   . Abnormality of gait 10/02/2013  . Lumbago 10/02/2013  . Obesity   . Periodic limb movement disorder (PLMD) 12/30/2013  . OSA (obstructive sleep apnea) 12/30/2013    Very mild OSA with REM accentuation,  [facility], Dr Frances FurbishAthar .     Past Surgical History  Procedure Laterality Date  . Colon surgery    . Surgery for bowel obstruction and removal of part of stomach  in 2000  2000  . Bilateral bunionectomy 1979    . Total knee arthroplasty  02/16/2012    Procedure: TOTAL KNEE ARTHROPLASTY;  Surgeon: Javier DockerJeffrey C Beane, MD;  Location: [facility];  Service: Orthopedics;  Laterality: Left;  . Breast surgery      biopsy   . Small intestine surgery      History  Substance Use Topics  . Smoking status: Former Smoker -- 2.00 packs/day for 20 years    Types: Cigarettes    Quit date: 10/17/1988  . Smokeless tobacco: Never Used  . Alcohol Use: No    Family History  Problem Relation Age of Onset  . Sleep apnea Father   . Multiple sclerosis Sister   . Multiple sclerosis Sister     Allergies  Allergen Reactions  . Zolpidem Tartrate Other (See Comments)    hallucinations  .  Amoxicillin Rash    Medication list has been reviewed and updated.  Current Outpatient Prescriptions on File Prior to Visit  Medication Sig Dispense Refill  . Aspirin-Salicylamide-Caffeine (BC HEADACHE POWDER PO) Take 2 each by mouth daily.      . chlorthalidone (HYGROTON) 25 MG tablet Take 25 mg by mouth daily with breakfast.      . cyanocobalamin (,VITAMIN B-12,) 1000 MCG/ML injection Inject 1,000 mcg into the muscle every 30 (thirty) days.      Marland Kitchen. gabapentin (NEURONTIN) 600 MG tablet Take 600 mg by mouth 4 (four) times daily.      Marland Kitchen. lisinopril (PRINIVIL,ZESTRIL) 20 MG tablet Take 20 mg by mouth 2 (two) times daily.      Marland Kitchen. rOPINIRole (REQUIP) 0.25 MG tablet TAKE ONE TABLET BY MOUTH AT BEDTIME   90 tablet  1  . verapamil (VERELAN PM) 120 MG 24 hr capsule Take 120  mg by mouth 2 (two) times daily.      . busPIRone (BUSPAR) 15 MG tablet Take 30 mg by mouth 2 (two) times daily.       Marland Kitchen. lovastatin (MEVACOR) 20 MG tablet Take 20 mg by mouth at bedtime.      Marland Kitchen. omeprazole (PRILOSEC) 10 MG capsule Take 10 mg by mouth daily.       No current facility-administered medications on file prior to visit.    Review of Systems:  As per HPI, otherwise negative.    Physical Examination: Filed Vitals:   07/24/14 1518  BP: 142/80  Pulse: 75  Temp: 98.8 F (37.1 C)  Resp: 18   Filed Vitals:   07/24/14 1518  Height: 5' 3.5" (1.613 m)  Weight: 259 lb 6.4 oz (117.663 kg)   Body mass index is 45.22 kg/(m^2). Ideal Body Weight: Weight in (lb) to have BMI = 25: 143.1   GEN: WDWN, NAD, Non-toxic, Alert & Oriented x 3 HEENT: Atraumatic, Normocephalic.  Ears and Nose: No external deformity. EXTR: No clubbing/cyanosis/edema NEURO: Normal gait.  PSYCH: Normally interactive. Conversant. Not depressed or anxious appearing.  Calm demeanor.  BACK no CVA tenderness   Assessment and Plan: Pyridium cipro  Signed,  Phillips OdorJeffery Adelia Baptista, MD  Results for orders placed in visit on 07/24/14  POCT URINALYSIS DIPSTICK      Result Value Ref Range   Color, UA light yellow     Clarity, UA clear     Glucose, UA neg     Bilirubin, UA neg     Ketones, UA neg     Spec Grav, UA <=1.005     Blood, UA trace-lysed     pH, UA 5.0     Protein, UA neg     Urobilinogen, UA 0.2     Nitrite, UA neg     Leukocytes, UA small (1+)    POCT UA - MICROSCOPIC ONLY      Result Value Ref Range   WBC, Ur, HPF, POC 5-7     RBC, urine, microscopic 1-3     Bacteria, U Microscopic trace     Mucus, UA neg     Epithelial cells, urine per micros 0-2     Crystals, Ur, HPF, POC neg     Casts, Ur, LPF, POC neg     Yeast, UA neg

## 2014-07-30 ENCOUNTER — Other Ambulatory Visit: Payer: Self-pay | Admitting: Emergency Medicine

## 2014-08-01 ENCOUNTER — Other Ambulatory Visit: Payer: Self-pay | Admitting: Emergency Medicine

## 2014-08-13 ENCOUNTER — Encounter: Payer: Self-pay | Admitting: Neurology

## 2014-08-19 ENCOUNTER — Encounter: Payer: Self-pay | Admitting: Neurology

## 2014-09-27 ENCOUNTER — Ambulatory Visit (INDEPENDENT_AMBULATORY_CARE_PROVIDER_SITE_OTHER): Payer: Medicare Other | Admitting: Family Medicine

## 2014-09-27 VITALS — BP 134/64 | HR 68 | Temp 98.0°F | Resp 16 | Ht 65.0 in | Wt 264.0 lb

## 2014-09-27 DIAGNOSIS — L03113 Cellulitis of right upper limb: Secondary | ICD-10-CM

## 2014-09-27 DIAGNOSIS — M25521 Pain in right elbow: Secondary | ICD-10-CM

## 2014-09-27 DIAGNOSIS — E119 Type 2 diabetes mellitus without complications: Secondary | ICD-10-CM

## 2014-09-27 MED ORDER — DOXYCYCLINE HYCLATE 100 MG PO CAPS: 100.0000 mg | ORAL_CAPSULE | Freq: Two times a day (BID) | ORAL | Status: DC

## 2014-09-27 NOTE — Patient Instructions (Signed)

## 2014-09-27 NOTE — Progress Notes (Signed)
Subjective:  This chart was scribed for Nilda Simmer, MD by Haywood Pao, ED Scribe at [facility].The patient was seen in exam room 05 and the patient's care was started at 10:20 AM.   Patient ID: [name], female    DOB: 1951-01-09, 64 y.o.   MRN: [id number]  09/27/2014  Elbow Injury  HPI  HPI Comments: [name] is a 64 y.o. female who presents to [facility] complaining of a right elbow infection, onset 2 weeks ago. Pt reports injuring her elbow after falling/tripping and forming a scab along R elbow region. Overtime an infection formed and she noticed a purulent discharge from her wound. Yesterday her arm was swollen, red and tender. She has soreness as an associated symptom. She has used hot compresses for relief. She has pain when moving her elbow joint. She denies fever, chills and diaphoresis. Her last tetanus shot was 5 years ago.  Swelling has decreased from yesterday.  No R axillary tenderness.  No recent drainage from wound.  Review of Systems  Constitutional: Negative for fever, chills, diaphoresis and fatigue.  Musculoskeletal: Positive for myalgias.  Skin: Positive for color change and wound. Negative for pallor and rash.    Past Medical History  Diagnosis Date  . Hypertension   . Hyperlipidemia   . Obesity   . Diabetes mellitus     ORAL MEDICATION  . GERD (gastroesophageal reflux disease)     RARE- OTC ANTACID IF NEEDED--PAST HX OF ULCER  . Neuromuscular disorder     NUMBNESS/TINGLING, JABBING PAIN IN FEET--NEURONTIN HELPING  . 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  PT ALSO HAS LOWER BACK PAIN AT TIMES  . Anxiety   . Depression   . Kidney stones     PT PASSED STONES--NO KNOWN STONES AT PRESENT TIMES  . Urinary incontinence   . Dysrhythmia     PT STATES IRREGULAR HB FELT TO BE RELATED TO STRESS--NEGATIVE CARDIAC WORK UP IN 2011 WITH DR. Shirlee Latch [facility] --NUCLEAR STRESS TEST, ECHO AND HOLTER  MONITOR RESULTS  IN EPIC   . Sleep apnea 02/10/2012    STOP BANG SCORE 4  . Ulcer   . Abnormality of gait 10/02/2013  . Lumbago 10/02/2013  . Obesity   . Periodic limb movement disorder (PLMD) 12/30/2013  . OSA (obstructive sleep apnea) 12/30/2013    Very mild OSA with REM accentuation,  tested 11-29-13 at [facility] sleep, Dr Frances Furbish .    Past Surgical History  Procedure Laterality Date  . Colon surgery    . Surgery for bowel obstruction and removal of part of stomach  in 2000  2000  . Bilateral bunionectomy 1979    . Total knee arthroplasty  02/16/2012    Procedure: TOTAL KNEE ARTHROPLASTY;  Surgeon: Javier Docker, MD;  Location: [facility];  Service: Orthopedics;  Laterality: Left;  . Breast surgery      biopsy   . Small intestine surgery     Allergies  Allergen Reactions  . Zolpidem Tartrate Other (See Comments)    hallucinations  . Amoxicillin Rash   Current Outpatient Prescriptions  Medication Sig Dispense Refill  . Asenapine Maleate (SAPHRIS) 10 MG SUBL Place 10 mg under the tongue.    . Aspirin-Salicylamide-Caffeine (BC HEADACHE POWDER PO) Take 2 each by mouth daily.    Marland Kitchen buPROPion (WELLBUTRIN SR) 150 MG 12 hr tablet Take 150 mg by mouth daily.    . chlorthalidone (  HYGROTON) 25 MG tablet Take 25 mg by mouth daily with breakfast.    . citalopram (CELEXA) 20 MG tablet Take 20 mg by mouth daily.    . cyanocobalamin (,VITAMIN B-12,) 1000 MCG/ML injection Inject 1,000 mcg into the muscle every 30 (thirty) days.    Marland Kitchen gabapentin (NEURONTIN) 600 MG tablet Take 600 mg by mouth 4 (four) times daily.    Marland Kitchen lisinopril (PRINIVIL,ZESTRIL) 20 MG tablet Take 20 mg by mouth 2 (two) times daily.    . phenazopyridine (PYRIDIUM) 200 MG tablet Take 1 tablet (200 mg total) by mouth 3 (three) times daily as needed. 6 tablet 0  . rOPINIRole (REQUIP) 0.25 MG tablet TAKE ONE TABLET BY MOUTH AT BEDTIME  90 tablet 1  . verapamil (VERELAN PM) 120 MG 24 hr capsule Take 120 mg by mouth 2 (two) times daily.    Marland Kitchen doxycycline  (VIBRAMYCIN) 100 MG capsule Take 1 capsule (100 mg total) by mouth 2 (two) times daily. 20 capsule 0   No current facility-administered medications for this visit.       Objective:    BP 134/64 mmHg  Pulse 68  Temp(Src) 98 F (36.7 C)  Resp 16  Ht  (1.651 m)  Wt 264 lb (119.75 kg)  BMI 43.93 kg/m2  SpO2 98% Physical Exam  Constitutional: She is oriented to person, place, and time. She appears well-developed and well-nourished. No distress.  HENT:  Head: Normocephalic and atraumatic.  Eyes: Conjunctivae and EOM are normal. Pupils are equal, round, and reactive to light.  Neck: Normal range of motion. Neck supple.  Cardiovascular: Normal rate, regular rhythm and normal heart sounds.  Exam reveals no gallop and no friction rub.   No murmur heard. Pulmonary/Chest: Effort normal and breath sounds normal. She has no wheezes. She has no rales.  Musculoskeletal:  R ELBOW:  +Full extension and flexion of elbow without pain, +normal supination and pronation.  +TTP elbow medial and lateral epicondyles and proximal forearm.  Neurological: She is alert and oriented to person, place, and time. No cranial nerve deficit. She exhibits normal muscle tone. Coordination normal.  Skin: Skin is warm and dry. She is not diaphoretic. There is erythema.     R ELBOW/FOREARM:  +diffuse swelling along proximal forearm, elbow with induration and mild tenderness; no fluctuance to area.  Well healing wound along elbow; no fluctuants around wound.  Psychiatric: She has a normal mood and affect. Her behavior is normal.  Nursing note and vitals reviewed.       Assessment & Plan:   1. Pain in right elbow   2. Cellulitis of right forearm   3. Type 2 diabetes mellitus without complication     -New. -Rx for Doxycycline provided. -Did not include Keflex due to Amoxicillin allergy (SOB).   -Continue warm compresses bid to tid.   -RTC for fever, increasing redness, swelling, or pain. -Elevate arm  throughout the day. -Monitor sugars closely with acute infection. -Tetanus UTD in 2011.  Meds ordered this encounter  Medications  . doxycycline (VIBRAMYCIN) 100 MG capsule    Sig: Take 1 capsule (100 mg total) by mouth 2 (two) times daily.    Dispense:  20 capsule    Refill:  0    No Follow-up on file.    I personally performed the services described in this documentation, which was scribed in my presence. The recorded information has been reviewed and considered.   Dalaina Tates Paulita Fujita, M.D. [facility]  [address], [place]  [address] [phone] phone [phone] fax

## 2014-10-09 ENCOUNTER — Ambulatory Visit (INDEPENDENT_AMBULATORY_CARE_PROVIDER_SITE_OTHER): Payer: Medicare Other | Admitting: Family Medicine

## 2014-10-09 ENCOUNTER — Ambulatory Visit (INDEPENDENT_AMBULATORY_CARE_PROVIDER_SITE_OTHER): Payer: Medicare Other

## 2014-10-09 VITALS — BP 119/66 | HR 68 | Temp 97.9°F | Resp 18 | Wt 256.0 lb

## 2014-10-09 DIAGNOSIS — L03113 Cellulitis of right upper limb: Secondary | ICD-10-CM

## 2014-10-09 DIAGNOSIS — M7021 Olecranon bursitis, right elbow: Secondary | ICD-10-CM

## 2014-10-09 MED ORDER — DOXYCYCLINE HYCLATE 100 MG PO CAPS: 100.0000 mg | ORAL_CAPSULE | Freq: Two times a day (BID) | ORAL | Status: DC

## 2014-10-09 NOTE — Progress Notes (Addendum)
Subjective:  This chart was scribed for [name], MD by Elveria Risingimelie Horne, Medial Scribe. This patient was seen in room 14 and the patient's care was started at 8:35 PM.    Patient ID: [name], female    DOB: [birth year], 64 y.o.   MRN: [id number]  10/09/2014  Follow-up and right arm   HPI HPI Comments: [name] is a 64 y.o. female who [facility] complaining of continued right elbow pain after a fall injury 09/27/2014. Patient reports development of blister, pain, and swelling at her right elbow five days ago. Patient reports pain even at rest; but only mild pain. Patient reports finishing her antibiotic course Monday morning, four days ago. Patient reports that she tolerated the antibiotics well. Patient reports worsening redness and elbow swelling after completing the antibiotics. Denies fever/chills/sweats/malaise/myalgias.  No axillary tenderness or swelling; no streaking.  No drainage.    Review of Systems  Constitutional: Negative for fever, chills, diaphoresis and fatigue.  Musculoskeletal: Positive for myalgias and joint swelling.  Skin: Positive for color change. Negative for rash and wound.    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  PT ALSO HAS LOWER BACK PAIN AT TIMES   Anxiety    Depression    Kidney stones     PT PASSED STONES--NO KNOWN STONES AT PRESENT TIMES   Urinary incontinence    Dysrhythmia     PT STATES IRREGULAR HB FELT TO BE RELATED TO STRESS--NEGATIVE CARDIAC WORK UP IN 2011 WITH DR. Shirlee [facility] --NUCLEAR STRESS TEST, ECHO AND HOLTER  MONITOR RESULTS IN EPIC     Sleep apnea 02/10/2012    STOP BANG SCORE 4   Ulcer    Abnormality of gait 10/02/2013   Lumbago 10/02/2013   Obesity    Periodic limb movement disorder (PLMD) 12/30/2013   OSA (obstructive sleep apnea) 12/30/2013    Very mild OSA with REM accentuation,  [facility], Dr Frances FurbishAthar .    Past Surgical History  Procedure Laterality Date   Colon surgery     Surgery for bowel obstruction and removal of part of stomach  in 2000  2000   Bilateral bunionectomy 1979     Total knee arthroplasty  02/16/2012    Procedure: TOTAL KNEE ARTHROPLASTY;  Surgeon: Javier DockerJeffrey C Beane, MD;  Location: [facility];  Service: Orthopedics;  Laterality: Left;   Breast surgery      biopsy    Small intestine surgery     Allergies  Allergen Reactions   Zolpidem Tartrate Other (See Comments)    hallucinations   Amoxicillin Rash   Current Outpatient Prescriptions  Medication Sig Dispense Refill   Asenapine Maleate (SAPHRIS) 10 MG SUBL Place 10 mg under the tongue.     Aspirin-Salicylamide-Caffeine (BC HEADACHE POWDER PO) Take 2 each by mouth daily.     buPROPion (WELLBUTRIN SR) 150 MG 12 hr tablet Take 150 mg by mouth daily.     chlorthalidone (HYGROTON) 25 MG tablet Take 25 mg by mouth daily with breakfast.  citalopram (CELEXA) 20 MG tablet Take 20 mg by mouth daily.     cyanocobalamin (,VITAMIN B-12,) 1000 MCG/ML injection Inject 1,000 mcg into the muscle every 30 (thirty) days.     gabapentin (NEURONTIN) 600 MG tablet Take 600 mg by mouth 4 (four) times daily.     lisinopril (PRINIVIL,ZESTRIL) 20 MG tablet Take 20 mg by mouth 2 (two) times daily.     rOPINIRole (REQUIP) 0.25 MG tablet TAKE ONE TABLET BY MOUTH AT BEDTIME  90 tablet 1   verapamil (VERELAN PM) 120 MG 24 hr capsule Take 120 mg by mouth 2 (two) times daily.     doxycycline (VIBRAMYCIN) 100 MG capsule Take 1 capsule (100 mg total) by mouth 2 (two) times daily. 20 capsule 0   No current facility-administered medications  for this visit.       Objective:    BP 119/66 mmHg   Pulse 68   Temp(Src) 97.9 F (36.6 C) (Oral)   Resp 18   Wt 256 lb (116.121 kg)   SpO2 98% Physical Exam  Constitutional: She is oriented to person, place, and time. She appears well-developed and well-nourished. No distress.  HENT:  Head: Normocephalic and atraumatic.  Eyes: Conjunctivae and EOM are normal. Pupils are equal, round, and reactive to light.  Neck: Neck supple.  Pulmonary/Chest: Effort normal. No respiratory distress.  Musculoskeletal: Normal range of motion.  Right ELBOW: complete resolution of right forearm erythema and induration as noted at previous visit. Patient with localized 3cm olecranon erythema, warmth and mild tenderness that is new from last visit. +Minimal fluctuance. Full extension and flexion of elbow.  No streaking. Normal supination, pronation.   Neurological: She is alert and oriented to person, place, and time.  Skin: Skin is warm and dry. She is not diaphoretic. There is erythema.  Psychiatric: She has a normal mood and affect. Her behavior is normal.  Nursing note and vitals reviewed.   [facility] reading (PRIMARY) by  Dr. Katrinka Blazing.  R ELBOW: NAD; +olecranon swelling.      Assessment & Plan:   1. Olecranon bursitis, right   2. Cellulitis of right arm     1. R olecranon bursitis: New.  Development of R olecranon swelling mild since visit twelve days ago.  Recommend rest, compression.  Associated with persistent/recurrent cellulitis of R arm after completion of Doxycycline four days ago.  Treat with Doxycycline again in office before aspirating bursa.  Call in 72 hours with update; if no improvement with Doxycycline, will warrant aspiration of olecranon bursa. 2.  R arm cellulitis: recurrent; refill of Doxycycline provided; responded well with first rx yet recurred when abx completed.    Meds ordered this encounter  Medications   doxycycline (VIBRAMYCIN) 100 MG capsule    Sig: Take 1 capsule (100  mg total) by mouth 2 (two) times daily.    Dispense:  20 capsule    Refill:  0    No Follow-up on file.   I personally performed the services described in this documentation, which was scribed in my presence. The recorded information has been reviewed and considered.   Kristi Paulita Fujita, M.D. [facility] [address] [place], [place]  [address] [phone] phone [phone] fax

## 2014-10-09 NOTE — Patient Instructions (Signed)
1.  Call on Monday with update.  [phone]

## 2014-10-19 ENCOUNTER — Ambulatory Visit (INDEPENDENT_AMBULATORY_CARE_PROVIDER_SITE_OTHER): Payer: Medicare Other | Admitting: Emergency Medicine

## 2014-10-19 VITALS — BP 152/76 | HR 69 | Temp 98.7°F | Resp 16 | Ht 65.0 in | Wt 253.0 lb

## 2014-10-19 DIAGNOSIS — R300 Dysuria: Secondary | ICD-10-CM

## 2014-10-19 DIAGNOSIS — R350 Frequency of micturition: Secondary | ICD-10-CM

## 2014-10-19 LAB — POCT UA - MICROSCOPIC ONLY
CASTS, UR, LPF, POC: NEGATIVE
Crystals, Ur, HPF, POC: NEGATIVE
Mucus, UA: NEGATIVE
YEAST UA: NEGATIVE

## 2014-10-19 LAB — POCT URINALYSIS DIPSTICK
Bilirubin, UA: NEGATIVE
Blood, UA: NEGATIVE
GLUCOSE UA: NEGATIVE
KETONES UA: NEGATIVE
NITRITE UA: NEGATIVE
Protein, UA: NEGATIVE
SPEC GRAV UA: 1.025
UROBILINOGEN UA: 0.2
pH, UA: 5.5

## 2014-10-19 MED ORDER — CIPROFLOXACIN HCL 500 MG PO TABS: 500.0000 mg | ORAL_TABLET | Freq: Two times a day (BID) | ORAL | Status: DC

## 2014-10-19 MED ORDER — PHENAZOPYRIDINE HCL 95 MG PO TABS: 95.0000 mg | ORAL_TABLET | Freq: Three times a day (TID) | ORAL | Status: DC | PRN

## 2014-10-19 NOTE — Progress Notes (Signed)
   Subjective:    Patient ID: [name], female    DOB: 09-Mar-1951, 64 y.o.   MRN: [id number]  HPI patient has low abdominal pain urgency and his sensation she does not empty her bladder. She has a history of urinary tract infections. She was most recently treated with Cipro for 3 days in October. She does have a history of kidney stones but no recent problems with them.    Review of Systems  Constitutional: Negative.   Respiratory: Negative.   Cardiovascular: Negative.   Genitourinary:       She has urinary urgency and burning.       Objective:   Physical Exam  Constitutional: She appears well-developed and well-nourished.  Cardiovascular: Normal rate.   Pulmonary/Chest: Breath sounds normal.  Abdominal:  There is a large midline scar. There is tenderness in the suprapubic area.   Results for orders placed or performed in visit on 10/19/14  POCT UA - Microscopic Only  Result Value Ref Range   WBC, Ur, HPF, POC 8-12    RBC, urine, microscopic 0-2    Bacteria, U Microscopic small    Mucus, UA neg    Epithelial cells, urine per micros 3-5    Crystals, Ur, HPF, POC neg    Casts, Ur, LPF, POC neg    Yeast, UA neg   POCT urinalysis dipstick  Result Value Ref Range   Color, UA yellow    Clarity, UA clear    Glucose, UA neg    Bilirubin, UA neg    Ketones, UA neg    Spec Grav, UA 1.025    Blood, UA neg    pH, UA 5.5    Protein, UA neg    Urobilinogen, UA 0.2    Nitrite, UA neg    Leukocytes, UA Trace            Assessment & Plan:  Mostly white cells. We'll treat with Cipro one twice a day. I encouraged her to take probiotics twice a day also.

## 2014-10-19 NOTE — Patient Instructions (Signed)

## 2014-10-21 LAB — URINE CULTURE

## 2014-11-03 ENCOUNTER — Ambulatory Visit: Payer: Self-pay | Admitting: Orthopedic Surgery

## 2014-11-12 ENCOUNTER — Encounter (HOSPITAL_COMMUNITY): Payer: Self-pay

## 2014-11-12 NOTE — Patient Instructions (Addendum)
[name]  11/12/2014   Your procedure is scheduled on: 11/20/2014    Report [facility]  Entr[facility] and follow signs to               Short Stay Center at      0830 AM.  Call this number if you have problems the morning of surgery [phone]   Remember:  Do not eat food or drink liquids :After Midnight.     Take these medicines the morning of surgery with A SIP OF WATER: Wellbutrin, Nexium, Verapamil                                You may not have any metal on your body including hair pins and              piercings  Do not wear jewelry, make-up, lotions, powders or perfumes., deodorant.               Do not wear nail polish.  Do not shave  48 hours prior to surgery.                 Do not bring valuables to the hospital. [name] IS NOT             RESPONSIBLE   FOR VALUABLES.  Contacts, dentures or bridgework may not be worn into surgery.  Leave suitcase in the car. After surgery it may be brought to your room.        Special Instructions: coughing and deep breathing exercises, leg exercises               Please read over the following fact sheets you were given: _____________________________________________________________________             [facility] - Preparing for Surgery Before surgery, you can play an important role.  Because skin is not sterile, your skin needs to be as free of germs as possible.  You can reduce the number of germs on your skin by washing with CHG (chlorahexidine gluconate) soap before surgery.  CHG is an antiseptic cleaner which kills germs and bonds with the skin to continue killing germs even after washing. Please DO NOT use if you have an allergy to CHG or antibacterial soaps.  If your skin becomes reddened/irritated stop using the CHG and inform your nurse when you arrive at Short Stay. Do not shave (including legs and underarms) for at least 48 hours prior to the first CHG shower.  You may shave your  face/neck. Please follow these instructions carefully:  1.  Shower with CHG Soap the night before surgery and the  morning of Surgery.  2.  If you choose to wash your hair, wash your hair first as usual with your  normal  shampoo.  3.  After you shampoo, rinse your hair and body thoroughly to remove the  shampoo.                           4.  Use CHG as you would any other liquid soap.  You can apply chg directly  to the skin and wash                       Gently with a scrungie or clean  washcloth.  5.  Apply the CHG Soap to your body ONLY FROM THE NECK DOWN.   Do not use on face/ open                           Wound or open sores. Avoid contact with eyes, ears mouth and genitals (private parts).                       Wash face,  Genitals (private parts) with your normal soap.             6.  Wash thoroughly, paying special attention to the area where your surgery  will be performed.  7.  Thoroughly rinse your body with warm water from the neck down.  8.  DO NOT shower/wash with your normal soap after using and rinsing off  the CHG Soap.                9.  Pat yourself dry with a clean towel.            10.  Wear clean pajamas.            11.  Place clean sheets on your bed the night of your first shower and do not  sleep with pets. Day of Surgery : Do not apply any lotions/deodorants the morning of surgery.  Please wear clean clothes to the hospital/surgery center.  FAILURE TO FOLLOW THESE INSTRUCTIONS MAY RESULT IN THE CANCELLATION OF YOUR SURGERY PATIENT SIGNATURE_________________________________  NURSE SIGNATURE__________________________________  ________________________________________________________________________  WHAT IS A BLOOD TRANSFUSION? Blood Transfusion Information  A transfusion is the replacement of blood or some of its parts. Blood is made up of multiple cells which provide different functions.  Red blood cells carry oxygen and are used for blood loss  replacement.  White blood cells fight against infection.  Platelets control bleeding.  Plasma helps clot blood.  Other blood products are available for specialized needs, such as hemophilia or other clotting disorders. BEFORE THE TRANSFUSION  Who gives blood for transfusions?   Healthy volunteers who are fully evaluated to make sure their blood is safe. This is blood bank blood. Transfusion therapy is the safest it has ever been in the practice of medicine. Before blood is taken from a donor, a complete history is taken to make sure that person has no history of diseases nor engages in risky social behavior (examples are intravenous drug use or sexual activity with multiple partners). The donor's travel history is screened to minimize risk of transmitting infections, such as malaria. The donated blood is tested for signs of infectious diseases, such as HIV and hepatitis. The blood is then tested to be sure it is compatible with you in order to minimize the chance of a transfusion reaction. If you or a relative donates blood, this is often done in anticipation of surgery and is not appropriate for emergency situations. It takes many days to process the donated blood. RISKS AND COMPLICATIONS Although transfusion therapy is very safe and saves many lives, the main dangers of transfusion include:  1. Getting an infectious disease. 2. Developing a transfusion reaction. This is an allergic reaction to something in the blood you were given. Every precaution is taken to prevent this. The decision to have a blood transfusion has been considered carefully by your caregiver before blood is given. Blood is not given unless the benefits outweigh the risks. AFTER THE TRANSFUSION  Right after receiving a blood transfusion, you will usually feel much better and more energetic. This is especially true if your red blood cells have gotten low (anemic). The transfusion raises the level of the red blood cells which  carry oxygen, and this usually causes an energy increase.  The nurse administering the transfusion will monitor you carefully for complications. HOME CARE INSTRUCTIONS  No special instructions are needed after a transfusion. You may find your energy is better. Speak with your caregiver about any limitations on activity for underlying diseases you may have. SEEK MEDICAL CARE IF:   Your condition is not improving after your transfusion.  You develop redness or irritation at the intravenous (IV) site. SEEK IMMEDIATE MEDICAL CARE IF:  Any of the following symptoms occur over the next 12 hours:  Shaking chills.  You have a temperature by mouth above 102 F (38.9 C), not controlled by medicine.  Chest, back, or muscle pain.  People around you feel you are not acting correctly or are confused.  Shortness of breath or difficulty breathing.  Dizziness and fainting.  You get a rash or develop hives.  You have a decrease in urine output.  Your urine turns a dark color or changes to pink, red, or brown. Any of the following symptoms occur over the next 10 days:  You have a temperature by mouth above 102 F (38.9 C), not controlled by medicine.  Shortness of breath.  Weakness after normal activity.  The white part of the eye turns yellow (jaundice).  You have a decrease in the amount of urine or are urinating less often.  Your urine turns a dark color or changes to pink, red, or brown. Document Released: 09/09/2000 Document Revised: 12/05/2011 Document Reviewed: 04/28/2008 ExitCare Patient Information 2014 [place].  _______________________________________________________________________  Incentive Spirometer  An incentive spirometer is a tool that can help keep your lungs clear and active. This tool measures how well you are filling your lungs with each breath. Taking long deep breaths may help reverse or decrease the chance of developing breathing (pulmonary) problems  (especially infection) following:  A long period of time when you are unable to move or be active. BEFORE THE PROCEDURE   If the spirometer includes an indicator to show your best effort, your nurse or respiratory therapist will set it to a desired goal.  If possible, sit up straight or lean slightly forward. Try not to slouch.  Hold the incentive spirometer in an upright position. INSTRUCTIONS FOR USE  3. Sit on the edge of your bed if possible, or sit up as far as you can in bed or on a chair. 4. Hold the incentive spirometer in an upright position. 5. Breathe out normally. 6. Place the mouthpiece in your mouth and seal your lips tightly around it. 7. Breathe in slowly and as deeply as possible, raising the piston or the ball toward the top of the column. 8. Hold your breath for 3-5 seconds or for as long as possible. Allow the piston or ball to fall to the bottom of the column. 9. Remove the mouthpiece from your mouth and breathe out normally. 10. Rest for a few seconds and repeat Steps 1 through 7 at least 10 times every 1-2 hours when you are awake. Take your time and take a few normal breaths between deep breaths. 11. The spirometer may include an indicator to show your best effort. Use the indicator as a goal to work toward during each repetition. 12. After  each set of 10 deep breaths, practice coughing to be sure your lungs are clear. If you have an incision (the cut made at the time of surgery), support your incision when coughing by placing a pillow or rolled up towels firmly against it. Once you are able to get out of bed, walk around indoors and cough well. You may stop using the incentive spirometer when instructed by your caregiver.  RISKS AND COMPLICATIONS  Take your time so you do not get dizzy or light-headed.  If you are in pain, you may need to take or ask for pain medication before doing incentive spirometry. It is harder to take a deep breath if you are having  pain. AFTER USE  Rest and breathe slowly and easily.  It can be helpful to keep track of a log of your progress. Your caregiver can provide you with a simple table to help with this. If you are using the spirometer at home, follow these instructions: [place] IF:   You are having difficultly using the spirometer.  You have trouble using the spirometer as often as instructed.  Your pain medication is not giving enough relief while using the spirometer.  You develop fever of 100.5 F (38.1 C) or higher. SEEK IMMEDIATE MEDICAL CARE IF:   You cough up bloody sputum that had not been present before.  You develop fever of 102 F (38.9 C) or greater.  You develop worsening pain at or near the incision site. MAKE SURE YOU:   Understand these instructions.  Will watch your condition.  Will get help right away if you are not doing well or get worse. Document Released: 01/23/2007 Document Revised: 12/05/2011 Document Reviewed: 03/26/2007 [facility] Patient Information 2014 [place], [place].   ________________________________________________________________________

## 2014-11-13 ENCOUNTER — Encounter (HOSPITAL_COMMUNITY)
Admission: RE | Admit: 2014-11-13 | Discharge: 2014-11-13 | Disposition: A | Discharge status: Home / Self Care | Payer: Medicare Other | Source: Ambulatory Visit | Attending: Specialist | Admitting: Specialist

## 2014-11-13 ENCOUNTER — Other Ambulatory Visit: Payer: Self-pay

## 2014-11-13 ENCOUNTER — Ambulatory Visit (HOSPITAL_COMMUNITY)
Admission: RE | Admit: 2014-11-13 | Discharge: 2014-11-13 | Disposition: A | Discharge status: Home / Self Care | Payer: Medicare Other | Source: Ambulatory Visit | Attending: Orthopedic Surgery | Admitting: Orthopedic Surgery

## 2014-11-13 ENCOUNTER — Encounter (HOSPITAL_COMMUNITY): Payer: Self-pay

## 2014-11-13 DIAGNOSIS — M1711 Unilateral primary osteoarthritis, right knee: Secondary | ICD-10-CM

## 2014-11-13 DIAGNOSIS — M179 Osteoarthritis of knee, unspecified: Secondary | ICD-10-CM | POA: Insufficient documentation

## 2014-11-13 DIAGNOSIS — Z01818 Encounter for other preprocedural examination: Secondary | ICD-10-CM | POA: Insufficient documentation

## 2014-11-13 HISTORY — DX: Headache: R51

## 2014-11-13 HISTORY — DX: Restless legs syndrome: G25.81

## 2014-11-13 HISTORY — DX: Headache, unspecified: R51.9

## 2014-11-13 LAB — PROTIME-INR
INR: 0.95 (ref 0.00–1.49)
Prothrombin Time: 12.8 seconds (ref 11.6–15.2)

## 2014-11-13 LAB — BASIC METABOLIC PANEL
ANION GAP: 9 (ref 5–15)
BUN: 18 mg/dL (ref 6–23)
CO2: 23 mmol/L (ref 19–32)
Calcium: 8.7 mg/dL (ref 8.4–10.5)
Chloride: 110 mmol/L (ref 96–112)
Creatinine, Ser: 1.05 mg/dL (ref 0.50–1.10)
GFR, EST AFRICAN AMERICAN: 64 mL/min — AB (ref >90)
GFR, EST NON AFRICAN AMERICAN: 55 mL/min — AB (ref >90)
GLUCOSE: 92 mg/dL (ref 70–99)
Potassium: 3.6 mmol/L (ref 3.5–5.1)
SODIUM: 142 mmol/L (ref 135–145)

## 2014-11-13 LAB — CBC
HCT: 36.8 % (ref 36.0–46.0)
HEMOGLOBIN: 10.9 g/dL — AB (ref 12.0–15.0)
MCH: 25.8 pg — AB (ref 26.0–34.0)
MCHC: 29.6 g/dL — ABNORMAL LOW (ref 30.0–36.0)
MCV: 87.2 fL (ref 78.0–100.0)
Platelets: 332 10*3/uL (ref 150–400)
RBC: 4.22 MIL/uL (ref 3.87–5.11)
RDW: 19.4 % — ABNORMAL HIGH (ref 11.5–15.5)
WBC: 8.3 10*3/uL (ref 4.0–10.5)

## 2014-11-13 LAB — URINE MICROSCOPIC-ADD ON

## 2014-11-13 LAB — APTT: aPTT: 32 seconds (ref 24–37)

## 2014-11-13 LAB — SURGICAL PCR SCREEN
MRSA, PCR: NEGATIVE
STAPHYLOCOCCUS AUREUS: NEGATIVE

## 2014-11-13 LAB — URINALYSIS, ROUTINE W REFLEX MICROSCOPIC
BILIRUBIN URINE: NEGATIVE
Glucose, UA: NEGATIVE mg/dL
Hgb urine dipstick: NEGATIVE
KETONES UR: NEGATIVE mg/dL
NITRITE: NEGATIVE
Protein, ur: NEGATIVE mg/dL
Specific Gravity, Urine: 1.022 (ref 1.005–1.030)
UROBILINOGEN UA: 0.2 mg/dL (ref 0.0–1.0)
pH: 5.5 (ref 5.0–8.0)

## 2014-11-13 NOTE — Progress Notes (Signed)
Micro, ua results faxed by epic to dr beane pod

## 2014-11-18 ENCOUNTER — Ambulatory Visit: Payer: Self-pay | Admitting: Orthopedic Surgery

## 2014-11-18 NOTE — H&P (Signed)
[name] DOB: 12-14-1950 Single / Language: [place] / Race: White Female  H&P Date: 11/17/14  Chief Complaint: Right knee pain  History of Present Illness  The patient is a 64 year old female who comes in today for a preoperative History and Physical. The patient is scheduled for a right total knee arthroplasty to be performed by Dr. Javier Docker, MD at [facility] on 11/20/14. [name] reports years of knee pain refractory to conservative tx including activity modifications, quad strengthening, injections, with pain limiting ADL's.  Dr. Shelle Iron and the patient mutually agreed to proceed with a right total knee replacement. Risks and benefits of the procedure were discussed including stiffness, suboptimal range of motion, persistent pain, infection requiring removal of prosthesis and reinsertion, need for prophylactic antibiotics in the future, or example: dental procedures, possible need for manipulation, revision in the future and also anesthetic complications including DVT, PE, etc. We discussed the perioperative course, time in the hospital, postoperative recovery, and the need for elevation to control swelling. We also discussed the predicted range of motion and the probability that squatting and kneeling would be unobtainable in the future. In addition, postoperative anticoagulation was discussed. We have obtained preoperative medical clearance as necessary- Dr. Sheryn Bison. Provided the patient with illustrated handouts and discussed it in detail. They will enroll in the total joint replacement educational forum at the hospital.  Allergies Ambien *HYPNOTICS* Hallucinations Amoxicillin *PENICILLINS* Itching.  Family History Heart Disease grandmother fathers side and grandfather fathers side Father septic shock Mother MI Siblings 2 sisters with MS  Social History Children 0 Current work status Disabled, Retired. Tobacco use Former smoker. quit 20 years  ago Pain Contract no Number of flights of stairs before winded greater than 5 Marital status single Living situation live alone, one story home with ramp to enter Alcohol use never consumed alcohol Advance Directives none Post-Surgical Plans home with HHPT, someone will stay with her first 1-2 weeks at all times  Medication History Citalopram Hydrobromide (  Tablet, Oral at bedtime) Active. ROPINIRole HCl (0.25MG  Tablet, Oral at bedtime) Active. BC Headache (325-16-95MG  Tablet, Oral) Active. (as needed) Lovastatin (  Tablet, Oral) Active. Vitamin B12 (Oral) Specific dose unknown - Active. (injections) Lisinopril (  Tablet, Oral two times daily) Active. Saphris (  Tab Sublingual, Sublingual) Active. Gabapentin (  Tablet, Oral four times daily) Active. Verapamil HCl (  Tablet, Oral two times daily) Active. Chlorthalidone (  Tablet, Oral daily) Active. Wellbutrin SR (  Tablet ER 12HR, Oral daily) Active. Multivitamin (Oral) Specific dose unknown - Active. NexIUM (Oral) Specific dose unknown - Active. Turmeric Specific dose unknown - Active. Nu-Iron (  Capsule, Oral daily) Active. Meloxicam (  Tablet, Oral daily) Active.  Past Surgical History Dilation and Curettage of Uterus Breast Mass; Local Excision left Foot Surgery bilateral Breast Biopsy right Resection of Stomach  Past Medical History Hypercholesterolemia Diabetes Mellitus, Type II Vertigo Anxiety Disorder Depression Hypertension Gastroesophageal Reflux Disease Kidney Stone L foot ulcer  Review of Systems General Not Present- Chills, Fatigue, Fever, Memory Loss, Night Sweats, Weight Gain and Weight Loss. Skin Not Present- Eczema, Hives, Itching, Lesions and Rash. HEENT Present- Headache and Hearing Loss. Not Present- Dentures, Double Vision, Tinnitus and Visual Loss. Respiratory Present- Shortness of breath with exertion. Not Present- Allergies,  Chronic Cough, Coughing up blood and Shortness of breath at rest. Cardiovascular Not Present- Chest Pain, Difficulty Breathing Lying Down, Murmur, Palpitations, Racing/skipping heartbeats and Swelling. Gastrointestinal Not Present- Abdominal Pain, Bloody Stool, Constipation, Diarrhea, Difficulty Swallowing, Heartburn, Jaundice, Loss of appetitie, Nausea  and Vomiting. Female Genitourinary Not Present- Blood in Urine, Discharge, Flank Pain, Incontinence, Painful Urination, Urgency, Urinary frequency, Urinary Retention, Urinating at Night and Weak urinary stream. Musculoskeletal Present- Back Pain and Morning Stiffness. Not Present- Joint Pain, Joint Swelling, Muscle Pain, Muscle Weakness and Spasms. Neurological Not Present- Blackout spells, Difficulty with balance, Dizziness, Paralysis, Tremor and Weakness. Psychiatric Not Present- Insomnia.  Physical Exam General Mental Status -Alert, cooperative and good historian. General Appearance-pleasant, Not in acute distress. Orientation-Oriented X3. Build & Nutrition-Well nourished and Well developed. Gait-Use of assistive device(cane) and Antalgic.  Head and Neck Head-normocephalic, atraumatic . Neck Global Assessment - supple, no bruit auscultated on the right, no bruit auscultated on the left.  Eye Pupil - Bilateral-Regular and Round. Motion - Bilateral-EOMI.  Chest and Lung Exam Auscultation Breath sounds - clear at anterior chest wall and clear at posterior chest wall. Adventitious sounds - No Adventitious sounds.  Cardiovascular Auscultation Rhythm - Regular rate and rhythm. Heart Sounds - S1 WNL and S2 WNL. Murmurs & Other Heart Sounds - Auscultation of the heart reveals - No Murmurs.  Abdomen Palpation/Percussion Tenderness - Abdomen is non-tender to palpation. Rigidity (guarding) - Abdomen is soft. Auscultation Auscultation of the abdomen reveals - Bowel sounds normal.  Female Genitourinary Not done, not  pertinent to present illness  Musculoskeletal On exam tender along the medial joint line. Patellofemoral pain with compression. Trace effusion. Range -5 to 120.  Knee exam on inspection reveals no evidence of soft tissue swelling, ecchymosis, deformity, or erythema. On palpation there is no tenderness in the lateral joint line. Nontender over the fibular head of the peroneal nerve. Nontender over the quadriceps insertion of the patellar ligament insertion. Provocative maneuvers revealed a negative Lachman, negative anterior and posterior drawer, and a negative McMurray's. No instability was noted with varus and valgus stressing at 0 or 30 degrees. On manual motor test the quadriceps and hamstrings were 5/5. Sensory exam was intact to light touch.  Imaging prior xrays with end-stage osteoarthritis right knee.  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 Shelle IronBeane on 11/20/14. We again discussed the procedure itself as well as risks, complications and alternatives, including but not limited to DVT, PE, infx, bleeding, failure of procedure, need for secondary procedure including manipulation, nerve injury, ongoing pain/symptoms, anesthesia risk, even stroke or death. Also discussed typical post-op protocols, activity restrictions, need for PT, flexion/extension exercises, time out of work. Discussed need for DVT ppx post-op with Xarelto then ASA per protocol. Discussed dental ppx. Also discussed limitations post-operatively such as kneeling and squatting. All questions were answered. Patient desires to proceed with surgery as scheduled. Will hold ASA and NSAIDs accordingly. Will remain NPO after MN night before surgery. [facility]. Plan Xarelto 2 weeks post-op for DVT ppx then ASA. Plan pain medication, Robaxin, Colace post-op. Plan home with  HHPT post-op with family members at home for assistance. Will follow up 10-14 days post-op for suture removal and xrays.  Plan Right total knee replacement  Signed electronically by Andrez GrimeJaclyn Tamir Wallman, PA-C for Dr. Shelle IronBeane

## 2014-11-19 ENCOUNTER — Ambulatory Visit: Payer: Self-pay | Admitting: Orthopedic Surgery

## 2014-11-20 ENCOUNTER — Encounter (HOSPITAL_COMMUNITY): Payer: Self-pay | Admitting: *Deleted

## 2014-11-20 ENCOUNTER — Encounter (HOSPITAL_COMMUNITY)
Admission: RE | Disposition: A | Discharge status: Skilled Nursing Facility | Payer: Self-pay | Source: Ambulatory Visit | Attending: Specialist

## 2014-11-20 ENCOUNTER — Inpatient Hospital Stay (HOSPITAL_COMMUNITY)
Admission: RE | Admit: 2014-11-20 | Discharge: 2014-11-25 | DRG: 470 | Disposition: A | Discharge status: Skilled Nursing Facility | Payer: Medicare Other | Source: Ambulatory Visit | Attending: Specialist | Admitting: Specialist

## 2014-11-20 ENCOUNTER — Inpatient Hospital Stay (HOSPITAL_COMMUNITY): Payer: Medicare Other | Admitting: Anesthesiology

## 2014-11-20 ENCOUNTER — Inpatient Hospital Stay (HOSPITAL_COMMUNITY): Payer: Medicare Other

## 2014-11-20 DIAGNOSIS — E785 Hyperlipidemia, unspecified: Secondary | ICD-10-CM | POA: Diagnosis present

## 2014-11-20 DIAGNOSIS — G4733 Obstructive sleep apnea (adult) (pediatric): Secondary | ICD-10-CM | POA: Diagnosis present

## 2014-11-20 DIAGNOSIS — Z881 Allergy status to other antibiotic agents status: Secondary | ICD-10-CM

## 2014-11-20 DIAGNOSIS — F419 Anxiety disorder, unspecified: Secondary | ICD-10-CM | POA: Diagnosis present

## 2014-11-20 DIAGNOSIS — Z87891 Personal history of nicotine dependence: Secondary | ICD-10-CM | POA: Diagnosis not present

## 2014-11-20 DIAGNOSIS — I4891 Unspecified atrial fibrillation: Secondary | ICD-10-CM | POA: Diagnosis not present

## 2014-11-20 DIAGNOSIS — E780 Pure hypercholesterolemia: Secondary | ICD-10-CM | POA: Diagnosis present

## 2014-11-20 DIAGNOSIS — F329 Major depressive disorder, single episode, unspecified: Secondary | ICD-10-CM | POA: Diagnosis present

## 2014-11-20 DIAGNOSIS — M1711 Unilateral primary osteoarthritis, right knee: Secondary | ICD-10-CM | POA: Diagnosis present

## 2014-11-20 DIAGNOSIS — K219 Gastro-esophageal reflux disease without esophagitis: Secondary | ICD-10-CM | POA: Diagnosis present

## 2014-11-20 DIAGNOSIS — Z6841 Body Mass Index (BMI) 40.0 and over, adult: Secondary | ICD-10-CM | POA: Diagnosis not present

## 2014-11-20 DIAGNOSIS — Z96651 Presence of right artificial knee joint: Secondary | ICD-10-CM

## 2014-11-20 DIAGNOSIS — G2581 Restless legs syndrome: Secondary | ICD-10-CM | POA: Diagnosis present

## 2014-11-20 DIAGNOSIS — E119 Type 2 diabetes mellitus without complications: Secondary | ICD-10-CM | POA: Diagnosis present

## 2014-11-20 DIAGNOSIS — M21161 Varus deformity, not elsewhere classified, right knee: Secondary | ICD-10-CM | POA: Diagnosis present

## 2014-11-20 DIAGNOSIS — I10 Essential (primary) hypertension: Secondary | ICD-10-CM | POA: Diagnosis present

## 2014-11-20 DIAGNOSIS — E876 Hypokalemia: Secondary | ICD-10-CM | POA: Diagnosis not present

## 2014-11-20 DIAGNOSIS — Z96652 Presence of left artificial knee joint: Secondary | ICD-10-CM | POA: Diagnosis present

## 2014-11-20 DIAGNOSIS — Z888 Allergy status to other drugs, medicaments and biological substances status: Secondary | ICD-10-CM | POA: Diagnosis not present

## 2014-11-20 DIAGNOSIS — M25561 Pain in right knee: Secondary | ICD-10-CM | POA: Diagnosis present

## 2014-11-20 HISTORY — PX: TOTAL KNEE ARTHROPLASTY: SHX125

## 2014-11-20 LAB — TYPE AND SCREEN
ABO/RH(D): O NEG
ANTIBODY SCREEN: NEGATIVE

## 2014-11-20 SURGERY — ARTHROPLASTY, KNEE, TOTAL
Anesthesia: General | Site: Knee | Laterality: Right

## 2014-11-20 MED ORDER — DIPHENHYDRAMINE HCL 12.5 MG/5ML PO ELIX: 12.5000 mg | ORAL_SOLUTION | ORAL | Status: DC | PRN

## 2014-11-20 MED ORDER — VERAPAMIL HCL ER 120 MG PO TBCR
120.0000 mg | EXTENDED_RELEASE_TABLET | Freq: Two times a day (BID) | ORAL | Status: DC
  Administered 2014-11-20 – 2014-11-25 (×8): 120 mg via ORAL
  Filled 2014-11-20 (×11): qty 1

## 2014-11-20 MED ORDER — LIDOCAINE HCL (CARDIAC) 20 MG/ML IV SOLN
INTRAVENOUS | Status: DC | PRN
  Administered 2014-11-20: 75 mg via INTRAVENOUS

## 2014-11-20 MED ORDER — GABAPENTIN 300 MG PO CAPS
600.0000 mg | ORAL_CAPSULE | Freq: Four times a day (QID) | ORAL | Status: DC
  Administered 2014-11-20 – 2014-11-25 (×21): 600 mg via ORAL
  Filled 2014-11-20 (×21): qty 2

## 2014-11-20 MED ORDER — PROPOFOL 10 MG/ML IV BOLUS
INTRAVENOUS | Status: DC | PRN
  Administered 2014-11-20: 200 mg via INTRAVENOUS

## 2014-11-20 MED ORDER — DEXTROSE 5 % IV SOLN
3.0000 g | Freq: Once | INTRAVENOUS | Status: AC
  Administered 2014-11-20: 3 g via INTRAVENOUS
  Filled 2014-11-20: qty 3000

## 2014-11-20 MED ORDER — MIDAZOLAM HCL 5 MG/5ML IJ SOLN
INTRAMUSCULAR | Status: DC | PRN
  Administered 2014-11-20 (×2): 1 mg via INTRAVENOUS

## 2014-11-20 MED ORDER — LIDOCAINE HCL (CARDIAC) 20 MG/ML IV SOLN
INTRAVENOUS | Status: AC
  Filled 2014-11-20: qty 5

## 2014-11-20 MED ORDER — PROPOFOL 10 MG/ML IV BOLUS
INTRAVENOUS | Status: AC
  Filled 2014-11-20: qty 20

## 2014-11-20 MED ORDER — METHOCARBAMOL 500 MG PO TABS
500.0000 mg | ORAL_TABLET | Freq: Four times a day (QID) | ORAL | Status: DC | PRN
  Administered 2014-11-20 – 2014-11-25 (×10): 500 mg via ORAL
  Filled 2014-11-20 (×11): qty 1

## 2014-11-20 MED ORDER — HYDROMORPHONE HCL 1 MG/ML IJ SOLN
INTRAMUSCULAR | Status: AC
  Filled 2014-11-20: qty 1

## 2014-11-20 MED ORDER — CEFAZOLIN SODIUM-DEXTROSE 2-3 GM-% IV SOLR: 2.0000 g | INTRAVENOUS | Status: DC

## 2014-11-20 MED ORDER — ROCURONIUM BROMIDE 100 MG/10ML IV SOLN
INTRAVENOUS | Status: AC
  Filled 2014-11-20: qty 1

## 2014-11-20 MED ORDER — POLYMYXIN B SULFATE 500000 UNITS IJ SOLR
INTRAMUSCULAR | Status: DC | PRN
  Administered 2014-11-20: 500 mL

## 2014-11-20 MED ORDER — DEXAMETHASONE SODIUM PHOSPHATE 10 MG/ML IJ SOLN
INTRAMUSCULAR | Status: DC | PRN
  Administered 2014-11-20: 10 mg via INTRAVENOUS

## 2014-11-20 MED ORDER — ALUM & MAG HYDROXIDE-SIMETH 200-200-20 MG/5ML PO SUSP: 30.0000 mL | ORAL | Status: DC | PRN

## 2014-11-20 MED ORDER — ESMOLOL HCL 10 MG/ML IV SOLN
INTRAVENOUS | Status: AC
  Filled 2014-11-20: qty 10

## 2014-11-20 MED ORDER — PHENOL 1.4 % MT LIQD: 1.0000 | OROMUCOSAL | Status: DC | PRN

## 2014-11-20 MED ORDER — HYDROCODONE-ACETAMINOPHEN 7.5-325 MG PO TABS: 1.0000 | ORAL_TABLET | ORAL | Status: DC | PRN

## 2014-11-20 MED ORDER — POLYMYXIN B SULFATE 500000 UNITS IJ SOLR
INTRAMUSCULAR | Status: AC
  Filled 2014-11-20: qty 1

## 2014-11-20 MED ORDER — SODIUM CHLORIDE 0.9 % IR SOLN
Status: DC | PRN
  Administered 2014-11-20: 2000 mL

## 2014-11-20 MED ORDER — CYANOCOBALAMIN 1000 MCG/ML IJ SOLN: 1000.0000 ug | INTRAMUSCULAR | Status: DC

## 2014-11-20 MED ORDER — METHOCARBAMOL 1000 MG/10ML IJ SOLN
500.0000 mg | Freq: Four times a day (QID) | INTRAVENOUS | Status: DC | PRN
  Administered 2014-11-20 – 2014-11-21 (×2): 500 mg via INTRAVENOUS
  Filled 2014-11-20 (×3): qty 5

## 2014-11-20 MED ORDER — RISAQUAD PO CAPS
1.0000 | ORAL_CAPSULE | Freq: Every day | ORAL | Status: DC
  Administered 2014-11-20 – 2014-11-25 (×6): 1 via ORAL
  Filled 2014-11-20 (×6): qty 1

## 2014-11-20 MED ORDER — NEOSTIGMINE METHYLSULFATE 10 MG/10ML IV SOLN
INTRAVENOUS | Status: DC | PRN
  Administered 2014-11-20: 2.5 mg via INTRAVENOUS

## 2014-11-20 MED ORDER — SUCCINYLCHOLINE CHLORIDE 20 MG/ML IJ SOLN
INTRAMUSCULAR | Status: DC | PRN
  Administered 2014-11-20: 100 mg via INTRAVENOUS

## 2014-11-20 MED ORDER — ADULT MULTIVITAMIN W/MINERALS CH
1.0000 | ORAL_TABLET | Freq: Every day | ORAL | Status: DC
  Administered 2014-11-21 – 2014-11-25 (×5): 1 via ORAL
  Filled 2014-11-20 (×5): qty 1

## 2014-11-20 MED ORDER — LACTATED RINGERS IV SOLN
INTRAVENOUS | Status: DC
  Administered 2014-11-20: 12:00:00 via INTRAVENOUS
  Administered 2014-11-20: 1000 mL via INTRAVENOUS
  Administered 2014-11-20: 11:00:00 via INTRAVENOUS

## 2014-11-20 MED ORDER — CEFAZOLIN SODIUM-DEXTROSE 2-3 GM-% IV SOLR
2.0000 g | Freq: Four times a day (QID) | INTRAVENOUS | Status: AC
  Administered 2014-11-20 – 2014-11-21 (×3): 2 g via INTRAVENOUS
  Filled 2014-11-20 (×3): qty 50

## 2014-11-20 MED ORDER — HYDROMORPHONE HCL 2 MG/ML IJ SOLN
INTRAMUSCULAR | Status: AC
  Filled 2014-11-20: qty 1

## 2014-11-20 MED ORDER — ESMOLOL HCL 10 MG/ML IV SOLN
INTRAVENOUS | Status: DC | PRN
  Administered 2014-11-20: 10 mg via INTRAVENOUS
  Administered 2014-11-20: 30 mg via INTRAVENOUS
  Administered 2014-11-20 (×2): 20 mg via INTRAVENOUS

## 2014-11-20 MED ORDER — GLYCOPYRROLATE 0.2 MG/ML IJ SOLN
INTRAMUSCULAR | Status: AC
  Filled 2014-11-20: qty 2

## 2014-11-20 MED ORDER — SUFENTANIL CITRATE 50 MCG/ML IV SOLN
INTRAVENOUS | Status: AC
  Filled 2014-11-20: qty 1

## 2014-11-20 MED ORDER — CITALOPRAM HYDROBROMIDE 20 MG PO TABS
20.0000 mg | ORAL_TABLET | Freq: Every day | ORAL | Status: DC
  Administered 2014-11-20 – 2014-11-24 (×5): 20 mg via ORAL
  Filled 2014-11-20 (×5): qty 1

## 2014-11-20 MED ORDER — DOCUSATE SODIUM 100 MG PO CAPS
100.0000 mg | ORAL_CAPSULE | Freq: Two times a day (BID) | ORAL | Status: DC
  Administered 2014-11-20 – 2014-11-25 (×10): 100 mg via ORAL
  Filled 2014-11-20 (×4): qty 1

## 2014-11-20 MED ORDER — LABETALOL HCL 5 MG/ML IV SOLN
INTRAVENOUS | Status: AC
  Filled 2014-11-20: qty 4

## 2014-11-20 MED ORDER — NEOSTIGMINE METHYLSULFATE 10 MG/10ML IV SOLN
INTRAVENOUS | Status: AC
  Filled 2014-11-20: qty 1

## 2014-11-20 MED ORDER — SENNOSIDES-DOCUSATE SODIUM 8.6-50 MG PO TABS: 1.0000 | ORAL_TABLET | Freq: Every evening | ORAL | Status: DC | PRN

## 2014-11-20 MED ORDER — ASENAPINE MALEATE 5 MG SL SUBL
10.0000 mg | SUBLINGUAL_TABLET | Freq: Every day | SUBLINGUAL | Status: DC
  Administered 2014-11-20 – 2014-11-24 (×5): 10 mg via SUBLINGUAL
  Filled 2014-11-20 (×6): qty 2

## 2014-11-20 MED ORDER — ACETAMINOPHEN 650 MG RE SUPP
650.0000 mg | Freq: Four times a day (QID) | RECTAL | Status: DC | PRN
  Administered 2014-11-25: 650 mg via RECTAL

## 2014-11-20 MED ORDER — HYDROMORPHONE HCL 1 MG/ML IJ SOLN
INTRAMUSCULAR | Status: DC | PRN
  Administered 2014-11-20: 1 mg via INTRAVENOUS

## 2014-11-20 MED ORDER — LACTATED RINGERS IV SOLN: INTRAVENOUS | Status: DC

## 2014-11-20 MED ORDER — RIVAROXABAN 10 MG PO TABS: 10.0000 mg | ORAL_TABLET | Freq: Every day | ORAL | Status: DC

## 2014-11-20 MED ORDER — ONDANSETRON HCL 4 MG/2ML IJ SOLN
INTRAMUSCULAR | Status: DC | PRN
Start: 2014-11-20 — End: 2014-11-20
  Administered 2014-11-20: 4 mg via INTRAVENOUS

## 2014-11-20 MED ORDER — POLYSACCHARIDE IRON COMPLEX 150 MG PO CAPS
150.0000 mg | ORAL_CAPSULE | Freq: Every day | ORAL | Status: DC
  Administered 2014-11-21 – 2014-11-25 (×5): 150 mg via ORAL
  Filled 2014-11-20 (×5): qty 1

## 2014-11-20 MED ORDER — DOCUSATE SODIUM 100 MG PO CAPS: 100.0000 mg | ORAL_CAPSULE | Freq: Two times a day (BID) | ORAL | Status: DC | PRN

## 2014-11-20 MED ORDER — CLINDAMYCIN PHOSPHATE 900 MG/50ML IV SOLN
INTRAVENOUS | Status: AC
  Filled 2014-11-20: qty 50

## 2014-11-20 MED ORDER — BUPIVACAINE-EPINEPHRINE 0.25% -1:200000 IJ SOLN
INTRAMUSCULAR | Status: DC | PRN
  Administered 2014-11-20: 50 mL

## 2014-11-20 MED ORDER — ACETAMINOPHEN 10 MG/ML IV SOLN
1000.0000 mg | Freq: Once | INTRAVENOUS | Status: AC
  Administered 2014-11-20: 1000 mg via INTRAVENOUS
  Filled 2014-11-20: qty 100

## 2014-11-20 MED ORDER — GLYCOPYRROLATE 0.2 MG/ML IJ SOLN
INTRAMUSCULAR | Status: DC | PRN
  Administered 2014-11-20: 0.4 mg via INTRAVENOUS
  Administered 2014-11-20: 0.2 mg via INTRAVENOUS

## 2014-11-20 MED ORDER — HYDROMORPHONE HCL 1 MG/ML IJ SOLN
INTRAMUSCULAR | Status: AC
  Administered 2014-11-21: 1 mg via INTRAVENOUS
  Filled 2014-11-20: qty 1

## 2014-11-20 MED ORDER — CLINDAMYCIN PHOSPHATE 900 MG/50ML IV SOLN: 900.0000 mg | INTRAVENOUS | Status: DC

## 2014-11-20 MED ORDER — ROCURONIUM BROMIDE 100 MG/10ML IV SOLN
INTRAVENOUS | Status: DC | PRN
  Administered 2014-11-20: 5 mg via INTRAVENOUS
  Administered 2014-11-20: 25 mg via INTRAVENOUS

## 2014-11-20 MED ORDER — ONDANSETRON HCL 4 MG/2ML IJ SOLN
INTRAMUSCULAR | Status: AC
  Filled 2014-11-20: qty 2

## 2014-11-20 MED ORDER — ONDANSETRON HCL 4 MG/2ML IJ SOLN
4.0000 mg | Freq: Four times a day (QID) | INTRAMUSCULAR | Status: DC | PRN
  Administered 2014-11-21: 4 mg via INTRAVENOUS
  Filled 2014-11-20: qty 2

## 2014-11-20 MED ORDER — MENTHOL 3 MG MT LOZG
1.0000 | LOZENGE | OROMUCOSAL | Status: DC | PRN
  Filled 2014-11-20: qty 9

## 2014-11-20 MED ORDER — CLINDAMYCIN PHOSPHATE 900 MG/50ML IV SOLN
900.0000 mg | Freq: Once | INTRAVENOUS | Status: AC
  Administered 2014-11-20: 900 mg via INTRAVENOUS

## 2014-11-20 MED ORDER — OXYCODONE HCL 5 MG PO TABS
5.0000 mg | ORAL_TABLET | ORAL | Status: DC | PRN
  Administered 2014-11-20 – 2014-11-24 (×15): 10 mg via ORAL
  Administered 2014-11-24: 5 mg via ORAL
  Administered 2014-11-25 (×3): 10 mg via ORAL
  Filled 2014-11-20: qty 1
  Filled 2014-11-20 (×18): qty 2

## 2014-11-20 MED ORDER — BUPIVACAINE-EPINEPHRINE 0.25% -1:200000 IJ SOLN
INTRAMUSCULAR | Status: AC
  Filled 2014-11-20: qty 1

## 2014-11-20 MED ORDER — DEXAMETHASONE SODIUM PHOSPHATE 10 MG/ML IJ SOLN
INTRAMUSCULAR | Status: AC
  Filled 2014-11-20: qty 1

## 2014-11-20 MED ORDER — GABAPENTIN 600 MG PO TABS
600.0000 mg | ORAL_TABLET | Freq: Four times a day (QID) | ORAL | Status: DC
  Filled 2014-11-20 (×2): qty 1

## 2014-11-20 MED ORDER — CHLORTHALIDONE 25 MG PO TABS
25.0000 mg | ORAL_TABLET | Freq: Every day | ORAL | Status: DC
  Administered 2014-11-21 – 2014-11-23 (×3): 25 mg via ORAL
  Filled 2014-11-20 (×4): qty 1

## 2014-11-20 MED ORDER — BISACODYL 5 MG PO TBEC: 5.0000 mg | DELAYED_RELEASE_TABLET | Freq: Every day | ORAL | Status: DC | PRN

## 2014-11-20 MED ORDER — PROPOFOL INFUSION 10 MG/ML OPTIME
INTRAVENOUS | Status: DC | PRN
  Administered 2014-11-20: 200 ug/kg/min via INTRAVENOUS

## 2014-11-20 MED ORDER — LISINOPRIL 20 MG PO TABS
20.0000 mg | ORAL_TABLET | Freq: Two times a day (BID) | ORAL | Status: DC
  Administered 2014-11-20 – 2014-11-23 (×6): 20 mg via ORAL
  Filled 2014-11-20 (×7): qty 1

## 2014-11-20 MED ORDER — LABETALOL HCL 5 MG/ML IV SOLN
INTRAVENOUS | Status: DC | PRN
  Administered 2014-11-20 (×2): 2.5 mg via INTRAVENOUS

## 2014-11-20 MED ORDER — ACETAMINOPHEN 325 MG PO TABS
650.0000 mg | ORAL_TABLET | Freq: Four times a day (QID) | ORAL | Status: DC | PRN
  Administered 2014-11-23 – 2014-11-24 (×3): 650 mg via ORAL
  Filled 2014-11-20 (×4): qty 2

## 2014-11-20 MED ORDER — METOCLOPRAMIDE HCL 5 MG/ML IJ SOLN: 5.0000 mg | Freq: Three times a day (TID) | INTRAMUSCULAR | Status: DC | PRN

## 2014-11-20 MED ORDER — RIVAROXABAN 10 MG PO TABS
10.0000 mg | ORAL_TABLET | Freq: Every day | ORAL | Status: DC
  Administered 2014-11-21 – 2014-11-25 (×5): 10 mg via ORAL
  Filled 2014-11-20 (×6): qty 1

## 2014-11-20 MED ORDER — METOCLOPRAMIDE HCL 10 MG PO TABS
5.0000 mg | ORAL_TABLET | Freq: Three times a day (TID) | ORAL | Status: DC | PRN
  Administered 2014-11-21 – 2014-11-22 (×2): 10 mg via ORAL
  Filled 2014-11-20 (×2): qty 1

## 2014-11-20 MED ORDER — ROPINIROLE HCL 0.25 MG PO TABS
0.2500 mg | ORAL_TABLET | Freq: Every day | ORAL | Status: DC
  Administered 2014-11-20 – 2014-11-24 (×5): 0.25 mg via ORAL
  Filled 2014-11-20 (×6): qty 1

## 2014-11-20 MED ORDER — SUFENTANIL CITRATE 50 MCG/ML IV SOLN
INTRAVENOUS | Status: DC | PRN
  Administered 2014-11-20 (×3): 10 ug via INTRAVENOUS
  Administered 2014-11-20 (×2): 15 ug via INTRAVENOUS

## 2014-11-20 MED ORDER — MIDAZOLAM HCL 2 MG/2ML IJ SOLN
INTRAMUSCULAR | Status: AC
  Filled 2014-11-20: qty 2

## 2014-11-20 MED ORDER — HYDROMORPHONE HCL 1 MG/ML IJ SOLN
1.0000 mg | INTRAMUSCULAR | Status: DC | PRN
  Administered 2014-11-20 – 2014-11-21 (×3): 1 mg via INTRAVENOUS
  Filled 2014-11-20 (×3): qty 1

## 2014-11-20 MED ORDER — ONDANSETRON HCL 4 MG PO TABS
4.0000 mg | ORAL_TABLET | Freq: Four times a day (QID) | ORAL | Status: DC | PRN
  Administered 2014-11-21: 4 mg via ORAL
  Filled 2014-11-20: qty 1

## 2014-11-20 MED ORDER — KCL IN DEXTROSE-NACL 20-5-0.45 MEQ/L-%-% IV SOLN
INTRAVENOUS | Status: AC
  Administered 2014-11-20: 21:00:00 via INTRAVENOUS
  Filled 2014-11-20 (×2): qty 1000

## 2014-11-20 MED ORDER — BUPROPION HCL ER (XL) 150 MG PO TB24
150.0000 mg | ORAL_TABLET | Freq: Every morning | ORAL | Status: DC
  Administered 2014-11-21 – 2014-11-25 (×5): 150 mg via ORAL
  Filled 2014-11-20 (×5): qty 1

## 2014-11-20 MED ORDER — MAGNESIUM CITRATE PO SOLN: 1.0000 | Freq: Once | ORAL | Status: AC | PRN

## 2014-11-20 MED ORDER — HYDROMORPHONE HCL 1 MG/ML IJ SOLN
0.2500 mg | INTRAMUSCULAR | Status: DC | PRN
  Administered 2014-11-20 (×3): 0.5 mg via INTRAVENOUS

## 2014-11-20 MED ORDER — PANTOPRAZOLE SODIUM 40 MG PO TBEC
40.0000 mg | DELAYED_RELEASE_TABLET | Freq: Every day | ORAL | Status: DC
  Administered 2014-11-21 – 2014-11-25 (×5): 40 mg via ORAL
  Filled 2014-11-20 (×5): qty 1

## 2014-11-20 SURGICAL SUPPLY — 82 items
BAG SPEC THK2 15X12 ZIP CLS (MISCELLANEOUS)
BAG ZIPLOCK 12X15 (MISCELLANEOUS) IMPLANT
BANDAGE ELASTIC 4 VELCRO ST LF (GAUZE/BANDAGES/DRESSINGS) ×3 IMPLANT
BANDAGE ELASTIC 6 VELCRO ST LF (GAUZE/BANDAGES/DRESSINGS) ×3 IMPLANT
BANDAGE ESMARK 6X9 LF (GAUZE/BANDAGES/DRESSINGS) ×1 IMPLANT
BLADE SAG 18X100X1.27 (BLADE) ×3 IMPLANT
BLADE SAW SGTL 13.0X1.19X90.0M (BLADE) ×3 IMPLANT
BNDG CMPR 9X6 STRL LF SNTH (GAUZE/BANDAGES/DRESSINGS) ×1
BNDG ESMARK 6X9 LF (GAUZE/BANDAGES/DRESSINGS) ×3
CAP KNEE TOTAL 3 SIGMA ×2 IMPLANT
CEMENT HV SMART SET (Cement) ×6 IMPLANT
CHLORAPREP W/TINT 26ML (MISCELLANEOUS) IMPLANT
CLOSURE WOUND 1/2 X4 (GAUZE/BANDAGES/DRESSINGS) ×2
CLOTH 2% CHLOROHEXIDINE 3PK (PERSONAL CARE ITEMS) ×3 IMPLANT
CUFF TOURN SGL QUICK 44 (TOURNIQUET CUFF) ×2 IMPLANT
DECANTER SPIKE VIAL GLASS SM (MISCELLANEOUS) ×3 IMPLANT
DRAPE INCISE IOBAN 66X45 STRL (DRAPES) ×2 IMPLANT
DRAPE ORTHO SPLIT 77X108 STRL (DRAPES) ×6
DRAPE POUCH INSTRU U-SHP 10X18 (DRAPES) ×3 IMPLANT
DRAPE SHEET LG 3/4 BI-LAMINATE (DRAPES) ×3 IMPLANT
DRAPE SURG ORHT 6 SPLT 77X108 (DRAPES) ×2 IMPLANT
DRAPE U-SHAPE 47X51 STRL (DRAPES) ×3 IMPLANT
DRSG ADAPTIC 3X8 NADH LF (GAUZE/BANDAGES/DRESSINGS) IMPLANT
DRSG AQUACEL AG ADV 3.5X10 (GAUZE/BANDAGES/DRESSINGS) ×2 IMPLANT
DRSG PAD ABDOMINAL 8X10 ST (GAUZE/BANDAGES/DRESSINGS) IMPLANT
DRSG TEGADERM 4X4.75 (GAUZE/BANDAGES/DRESSINGS) ×2 IMPLANT
DURAPREP 26ML APPLICATOR (WOUND CARE) ×3 IMPLANT
ELECT REM PT RETURN 9FT ADLT (ELECTROSURGICAL) ×3
ELECTRODE REM PT RTRN 9FT ADLT (ELECTROSURGICAL) ×1 IMPLANT
EVACUATOR 1/8 PVC DRAIN (DRAIN) ×2 IMPLANT
FACESHIELD WRAPAROUND (MASK) ×18 IMPLANT
FACESHIELD WRAPAROUND OR TEAM (MASK) ×5 IMPLANT
GAUZE SPONGE 2X2 8PLY STRL LF (GAUZE/BANDAGES/DRESSINGS) IMPLANT
GAUZE SPONGE 4X4 12PLY STRL (GAUZE/BANDAGES/DRESSINGS) IMPLANT
GLOVE BIO SURGEON STRL SZ7.5 (GLOVE) ×2 IMPLANT
GLOVE BIOGEL PI IND STRL 7.0 (GLOVE) IMPLANT
GLOVE BIOGEL PI IND STRL 7.5 (GLOVE) ×1 IMPLANT
GLOVE BIOGEL PI IND STRL 8 (GLOVE) ×1 IMPLANT
GLOVE BIOGEL PI INDICATOR 7.0 (GLOVE) ×2
GLOVE BIOGEL PI INDICATOR 7.5 (GLOVE) ×4
GLOVE BIOGEL PI INDICATOR 8 (GLOVE) ×2
GLOVE SURG SS PI 7.0 STRL IVOR (GLOVE) ×2 IMPLANT
GLOVE SURG SS PI 7.5 STRL IVOR (GLOVE) ×3 IMPLANT
GLOVE SURG SS PI 8.0 STRL IVOR (GLOVE) ×8 IMPLANT
GOWN BRE IMP PREV XXLGXLNG (GOWN DISPOSABLE) ×2 IMPLANT
GOWN STRL REUS W/ TWL LRG LVL3 (GOWN DISPOSABLE) IMPLANT
GOWN STRL REUS W/TWL LRG LVL3 (GOWN DISPOSABLE) ×3
GOWN STRL REUS W/TWL XL LVL3 (GOWN DISPOSABLE) ×8 IMPLANT
HANDPIECE INTERPULSE COAX TIP (DISPOSABLE) ×3
IMMOBILIZER KNEE 20 (SOFTGOODS) ×3
IMMOBILIZER KNEE 20 THIGH 36 (SOFTGOODS) ×1 IMPLANT
KIT BASIN OR (CUSTOM PROCEDURE TRAY) ×3 IMPLANT
MANIFOLD NEPTUNE II (INSTRUMENTS) ×3 IMPLANT
NDL SAFETY ECLIPSE 18X1.5 (NEEDLE) ×1 IMPLANT
NEEDLE HYPO 18GX1.5 SHARP (NEEDLE) ×3
NS IRRIG 1000ML POUR BTL (IV SOLUTION) IMPLANT
PACK TOTAL JOINT (CUSTOM PROCEDURE TRAY) ×3 IMPLANT
PADDING CAST COTTON 6X4 STRL (CAST SUPPLIES) IMPLANT
PEN SKIN MARKING BROAD (MISCELLANEOUS) ×3 IMPLANT
POSITIONER SURGICAL ARM (MISCELLANEOUS) ×3 IMPLANT
SET HNDPC FAN SPRY TIP SCT (DISPOSABLE) ×1 IMPLANT
SPONGE GAUZE 2X2 STER 10/PKG (GAUZE/BANDAGES/DRESSINGS) ×2
SPONGE LAP 18X18 X RAY DECT (DISPOSABLE) ×2 IMPLANT
SPONGE SURGIFOAM ABS GEL 100 (HEMOSTASIS) IMPLANT
STAPLER VISISTAT (STAPLE) ×2 IMPLANT
STRIP CLOSURE SKIN 1/2X4 (GAUZE/BANDAGES/DRESSINGS) ×2 IMPLANT
SUCTION FRAZIER 12FR DISP (SUCTIONS) ×3 IMPLANT
SUT BONE WAX W31G (SUTURE) ×2 IMPLANT
SUT MNCRL AB 4-0 PS2 18 (SUTURE) ×2 IMPLANT
SUT VIC AB 1 CT1 27 (SUTURE) ×3
SUT VIC AB 1 CT1 27XBRD ANTBC (SUTURE) ×1 IMPLANT
SUT VIC AB 2-0 CT1 27 (SUTURE) ×9
SUT VIC AB 2-0 CT1 TAPERPNT 27 (SUTURE) ×3 IMPLANT
SUT VLOC 180 0 24IN GS25 (SUTURE) ×3 IMPLANT
SYR 50ML LL SCALE MARK (SYRINGE) ×3 IMPLANT
TOWEL OR 17X26 10 PK STRL BLUE (TOWEL DISPOSABLE) ×3 IMPLANT
TOWEL OR NON WOVEN STRL DISP B (DISPOSABLE) ×2 IMPLANT
TOWER CARTRIDGE SMART MIX (DISPOSABLE) ×3 IMPLANT
TRAY FOLEY CATH 14FRSI W/METER (CATHETERS) ×3 IMPLANT
WATER STERILE IRR 1500ML POUR (IV SOLUTION) ×3 IMPLANT
WRAP KNEE MAXI GEL POST OP (GAUZE/BANDAGES/DRESSINGS) ×3 IMPLANT
YANKAUER SUCT BULB TIP 10FT TU (MISCELLANEOUS) ×3 IMPLANT

## 2014-11-20 NOTE — Brief Op Note (Signed)
11/20/2014  12:19 PM  PATIENT:  Enriqueta Shutterarolyn I Wessinger  64 y.o. female  PRE-OPERATIVE DIAGNOSIS:  right knee djd  POST-OPERATIVE DIAGNOSIS:  * No post-op diagnosis entered *  PROCEDURE:  Procedure(s): RIGHT TOTAL KNEE ARTHROPLASTY (Right)  SURGEON:  Surgeon(s) and Role:    * Javier DockerJeffrey C Kaitlan Bin, MD - Primary  PHYSICIAN ASSISTANT:   ASSISTANTS: Bissell   ANESTHESIA:   general  EBL:  Total I/O In: 2000 [I.V.:2000] Out: 450 [Urine:150; Blood:300]  BLOOD ADMINISTERED:none  DRAINS: (1) Hemovact drain(s) in the open with  Suction Open   LOCAL MEDICATIONS USED:  MARCAINE     SPECIMEN:  No Specimen  DISPOSITION OF SPECIMEN:  N/A  COUNTS:  YES  TOURNIQUET:  * Missing tourniquet times found for documented tourniquets in log:  [id number] * Total Tourniquet Time Documented: Thigh (Right) - 4 minutes Total: Thigh (Right) - 4 minutes   DICTATION: .Other Dictation: Dictation Number [id number]  PLAN OF CARE: Admit to inpatient   PATIENT DISPOSITION:  PACU - hemodynamically stable.   Delay start of Pharmacological VTE agent (>24hrs) due to surgical blood loss or risk of bleeding: no

## 2014-11-20 NOTE — Anesthesia Preprocedure Evaluation (Signed)
Anesthesia Evaluation  Patient identified by MRN, date of birth, ID band Patient awake    Reviewed: Allergy & Precautions, H&P , NPO status , Patient's Chart, lab work & pertinent test results, reviewed documented beta blocker date and time   Airway Mallampati: II  TM Distance: >3 FB Neck ROM: Full    Dental  (+) Teeth Intact, Dental Advisory Given   Pulmonary neg pulmonary ROS, shortness of breath and with exertion, sleep apnea , former smoker,  mildOSA breath sounds clear to auscultation        Cardiovascular hypertension, Pt. on medications Rhythm:Regular Rate:Normal  Hx palpitations, neg w/u   Neuro/Psych Depression Peripheral neuropathy negative neurological ROS  negative psych ROS   GI/Hepatic negative GI ROS, Neg liver ROS,   Endo/Other  Morbid obesityhgb 10.9  Renal/GU negative Renal ROS  negative genitourinary   Musculoskeletal negative musculoskeletal ROS (+)   Abdominal (+) + obese,   Peds negative pediatric ROS (+)  Hematology negative hematology ROS (+)   Anesthesia Other Findings   Reproductive/Obstetrics negative OB ROS                             Anesthesia Physical Anesthesia Plan  ASA: III  Anesthesia Plan: General   Post-op Pain Management:    Induction: Intravenous  Airway Management Planned: Oral ETT  Additional Equipment:   Intra-op Plan:   Post-operative Plan: Extubation in OR  Informed Consent:   Plan Discussed with: Surgeon  Anesthesia Plan Comments:         Anesthesia Quick Evaluation

## 2014-11-20 NOTE — Progress Notes (Signed)
X-ray results noted 

## 2014-11-20 NOTE — Progress Notes (Signed)
Portable AP  And Lateral Right Knee X-rays done. 

## 2014-11-20 NOTE — Progress Notes (Signed)
Utilization review completed.  

## 2014-11-20 NOTE — Transfer of Care (Signed)
Immediate Anesthesia Transfer of Care Note  Patient: [name]  Procedure(s) Performed: Procedure(s): RIGHT TOTAL KNEE ARTHROPLASTY (Right)  Patient Location: PACU  Anesthesia Type:General  Level of Consciousness: awake, oriented, sedated and patient cooperative  Airway & Oxygen Therapy: Patient Spontanous Breathing and Patient connected to face mask oxygen  Post-op Assessment: Report given to RN, Post -op Vital signs reviewed and stable and Patient moving all extremities X 4  Post vital signs: stable  Last Vitals:  Filed Vitals:   11/20/14 0821  BP: 150/71  Pulse: 63  Temp: 36.6 C  Resp: 18    Complications: No apparent anesthesia complications

## 2014-11-20 NOTE — Interval H&P Note (Signed)
History and Physical Interval Note:  11/20/2014 7:10 AM  [name]  has presented today for surgery, with the diagnosis of right knee djd  The various methods of treatment have been discussed with the patient and family. After consideration of risks, benefits and other options for treatment, the patient has consented to  Procedure(s): RIGHT TOTAL KNEE ARTHROPLASTY (Right) as a surgical intervention .  The patient's history has been reviewed, patient examined, no change in status, stable for surgery.  I have reviewed the patient's chart and labs.  Questions were answered to the patient's satisfaction.     [name]

## 2014-11-20 NOTE — H&P (View-Only) (Signed)
[name] DOB: 05/04/1951 Single / Language: English / Race: White Female  H&P Date: 11/17/14  Chief Complaint: Right knee pain  History of Present Illness  The patient is a 63 year old female who comes in today for a preoperative History and Physical. The patient is scheduled for a right total knee arthroplasty to be performed by Dr. Jeffrey C. Beane, MD at [facility] on 11/20/14. [name] reports years of knee pain refractory to conservative tx including activity modifications, quad strengthening, injections, with pain limiting ADL's.  Dr. Beane and the patient mutually agreed to proceed with a right total knee replacement. Risks and benefits of the procedure were discussed including stiffness, suboptimal range of motion, persistent pain, infection requiring removal of prosthesis and reinsertion, need for prophylactic antibiotics in the future, or example: dental procedures, possible need for manipulation, revision in the future and also anesthetic complications including DVT, PE, etc. We discussed the perioperative course, time in the hospital, postoperative recovery, and the need for elevation to control swelling. We also discussed the predicted range of motion and the probability that squatting and kneeling would be unobtainable in the future. In addition, postoperative anticoagulation was discussed. We have obtained preoperative medical clearance as necessary- Dr. David Orozco. Provided the patient with illustrated handouts and discussed it in detail. They will enroll in the total joint replacement educational forum at the hospital.  Allergies Ambien *HYPNOTICS* Hallucinations Amoxicillin *PENICILLINS* Itching.  Family History Heart Disease grandmother fathers side and grandfather fathers side Father septic shock Mother MI Siblings 2 sisters with MS  Social History Children 0 Current work status Disabled, Retired. Tobacco use Former smoker. quit 20 years  ago Pain Contract no Number of flights of stairs before winded greater than 5 Marital status single Living situation live alone, one story home with ramp to enter Alcohol use never consumed alcohol Advance Directives none Post-Surgical Plans home with HHPT, someone will stay with her first 1-2 weeks at all times  Medication History Citalopram Hydrobromide (20MG Tablet, Oral at bedtime) Active. ROPINIRole HCl (0.25MG Tablet, Oral at bedtime) Active. BC Headache (325-16-95MG Tablet, Oral) Active. (as needed) Lovastatin (20MG Tablet, Oral) Active. Vitamin B12 (Oral) Specific dose unknown - Active. (injections) Lisinopril (20MG Tablet, Oral two times daily) Active. Saphris (5MG Tab Sublingual, Sublingual) Active. Gabapentin (600MG Tablet, Oral four times daily) Active. Verapamil HCl (120MG Tablet, Oral two times daily) Active. Chlorthalidone (25MG Tablet, Oral daily) Active. Wellbutrin SR (150MG Tablet ER 12HR, Oral daily) Active. Multivitamin (Oral) Specific dose unknown - Active. NexIUM (Oral) Specific dose unknown - Active. Turmeric Specific dose unknown - Active. Nu-Iron (150MG Capsule, Oral daily) Active. Meloxicam (15MG Tablet, Oral daily) Active.  Past Surgical History Dilation and Curettage of Uterus Breast Mass; Local Excision left Foot Surgery bilateral Breast Biopsy right Resection of Stomach  Past Medical History Hypercholesterolemia Diabetes Mellitus, Type II Vertigo Anxiety Disorder Depression Hypertension Gastroesophageal Reflux Disease Kidney Stone L foot ulcer  Review of Systems General Not Present- Chills, Fatigue, Fever, Memory Loss, Night Sweats, Weight Gain and Weight Loss. Skin Not Present- Eczema, Hives, Itching, Lesions and Rash. HEENT Present- Headache and Hearing Loss. Not Present- Dentures, Double Vision, Tinnitus and Visual Loss. Respiratory Present- Shortness of breath with exertion. Not Present- Allergies,  Chronic Cough, Coughing up blood and Shortness of breath at rest. Cardiovascular Not Present- Chest Pain, Difficulty Breathing Lying Down, Murmur, Palpitations, Racing/skipping heartbeats and Swelling. Gastrointestinal Not Present- Abdominal Pain, Bloody Stool, Constipation, Diarrhea, Difficulty Swallowing, Heartburn, Jaundice, Loss of appetitie, Nausea   and Vomiting. Female Genitourinary Not Present- Blood in Urine, Discharge, Flank Pain, Incontinence, Painful Urination, Urgency, Urinary frequency, Urinary Retention, Urinating at Night and Weak urinary stream. Musculoskeletal Present- Back Pain and Morning Stiffness. Not Present- Joint Pain, Joint Swelling, Muscle Pain, Muscle Weakness and Spasms. Neurological Not Present- Blackout spells, Difficulty with balance, Dizziness, Paralysis, Tremor and Weakness. Psychiatric Not Present- Insomnia.  Physical Exam General Mental Status -Alert, cooperative and good historian. General Appearance-pleasant, Not in acute distress. Orientation-Oriented X3. Build & Nutrition-Well nourished and Well developed. Gait-Use of assistive device(cane) and Antalgic.  Head and Neck Head-normocephalic, atraumatic . Neck Global Assessment - supple, no bruit auscultated on the right, no bruit auscultated on the left.  Eye Pupil - Bilateral-Regular and Round. Motion - Bilateral-EOMI.  Chest and Lung Exam Auscultation Breath sounds - clear at anterior chest wall and clear at posterior chest wall. Adventitious sounds - No Adventitious sounds.  Cardiovascular Auscultation Rhythm - Regular rate and rhythm. Heart Sounds - S1 WNL and S2 WNL. Murmurs & Other Heart Sounds - Auscultation of the heart reveals - No Murmurs.  Abdomen Palpation/Percussion Tenderness - Abdomen is non-tender to palpation. Rigidity (guarding) - Abdomen is soft. Auscultation Auscultation of the abdomen reveals - Bowel sounds normal.  Female Genitourinary Not done, not  pertinent to present illness  Musculoskeletal On exam tender along the medial joint line. Patellofemoral pain with compression. Trace effusion. Range -5 to 120.  Knee exam on inspection reveals no evidence of soft tissue swelling, ecchymosis, deformity, or erythema. On palpation there is no tenderness in the lateral joint line. Nontender over the fibular head of the peroneal nerve. Nontender over the quadriceps insertion of the patellar ligament insertion. Provocative maneuvers revealed a negative Lachman, negative anterior and posterior drawer, and a negative McMurray's. No instability was noted with varus and valgus stressing at 0 or 30 degrees. On manual motor test the quadriceps and hamstrings were 5/5. Sensory exam was intact to light touch.  Imaging prior xrays with end-stage osteoarthritis right knee.  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 Beane on 11/20/14. We again discussed the procedure itself as well as risks, complications and alternatives, including but not limited to DVT, PE, infx, bleeding, failure of procedure, need for secondary procedure including manipulation, nerve injury, ongoing pain/symptoms, anesthesia risk, even stroke or death. Also discussed typical post-op protocols, activity restrictions, need for PT, flexion/extension exercises, time out of work. Discussed need for DVT ppx post-op with Xarelto then ASA per protocol. Discussed dental ppx. Also discussed limitations post-operatively such as kneeling and squatting. All questions were answered. Patient desires to proceed with surgery as scheduled. Will hold ASA and NSAIDs accordingly. Will remain NPO after MN night before surgery. Will present to [facility] for pre-op testing. Plan Xarelto 2 weeks post-op for DVT ppx then ASA. Plan pain medication, Robaxin, Colace post-op. Plan home with  HHPT post-op with family members at home for assistance. Will follow up 10-14 days post-op for suture removal and xrays.  Plan Right total knee replacement  Signed electronically by Jaclyn Bissell, PA-C for Dr. Beane  

## 2014-11-20 NOTE — Anesthesia Postprocedure Evaluation (Signed)
  Anesthesia Post-op Note  Patient: Yvette ShutterCarolyn I Tencza  Procedure(s) Performed: Procedure(s) (LRB): RIGHT TOTAL KNEE ARTHROPLASTY (Right)  Patient Location: PACU  Anesthesia Type: General  Level of Consciousness: awake and alert   Airway and Oxygen Therapy: Patient Spontanous Breathing  Post-op Pain: mild  Post-op Assessment: Post-op Vital signs reviewed, Patient's Cardiovascular Status Stable, Respiratory Function Stable, Patent Airway and No signs of Nausea or vomiting  Last Vitals:  Filed Vitals:   11/20/14 1437  BP:   Pulse: 75  Temp:   Resp: 16    Post-op Vital Signs: stable   Complications: No apparent anesthesia complications

## 2014-11-20 NOTE — Plan of Care (Signed)
Problem: Consults Goal: Diagnosis- Total Joint Replacement Right total knee     

## 2014-11-21 ENCOUNTER — Encounter (HOSPITAL_COMMUNITY): Payer: Self-pay | Admitting: Specialist

## 2014-11-21 LAB — BASIC METABOLIC PANEL
Anion gap: 9 (ref 5–15)
BUN: 12 mg/dL (ref 6–23)
CALCIUM: 9 mg/dL (ref 8.4–10.5)
CO2: 25 mmol/L (ref 19–32)
CREATININE: 1 mg/dL (ref 0.50–1.10)
Chloride: 107 mmol/L (ref 96–112)
GFR calc Af Amer: 68 mL/min — ABNORMAL LOW (ref >90)
GFR, EST NON AFRICAN AMERICAN: 59 mL/min — AB (ref >90)
Glucose, Bld: 162 mg/dL — ABNORMAL HIGH (ref 70–99)
Potassium: 4.1 mmol/L (ref 3.5–5.1)
Sodium: 141 mmol/L (ref 135–145)

## 2014-11-21 LAB — CBC
HEMATOCRIT: 29.8 % — AB (ref 36.0–46.0)
HEMOGLOBIN: 8.8 g/dL — AB (ref 12.0–15.0)
MCH: 25.4 pg — AB (ref 26.0–34.0)
MCHC: 29.5 g/dL — ABNORMAL LOW (ref 30.0–36.0)
MCV: 86.1 fL (ref 78.0–100.0)
Platelets: 326 10*3/uL (ref 150–400)
RBC: 3.46 MIL/uL — AB (ref 3.87–5.11)
RDW: 18.8 % — ABNORMAL HIGH (ref 11.5–15.5)
WBC: 12.7 10*3/uL — ABNORMAL HIGH (ref 4.0–10.5)

## 2014-11-21 NOTE — Progress Notes (Signed)
Patient ID: [name], female   DOB: 1951-06-14, 64 y.o.   MRN: [id number] Subjective: 1 Day Post-Op Procedure(s) (LRB): RIGHT TOTAL KNEE ARTHROPLASTY (Right) Patient reports pain as moderate.    Patient has complaints of R knee pain and some calf tightness  We will start therapy today. Plan is to go home with HHPT after hospital stay.  Objective: Vital signs in last 24 hours: Temp:  [97.4 F (36.3 C)-99.6 F (37.6 C)] 99.6 F (37.6 C) (02/26 0538) Pulse Rate:  [63-87] 84 (02/26 0538) Resp:  [9-19] 16 (02/26 0538) BP: (123-171)/(51-79) 138/56 mmHg (02/26 0538) SpO2:  [92 %-100 %] 97 % (02/26 0538) Weight:  [119.75 kg (264 lb)] 119.75 kg (264 lb) (02/25 1525)  Intake/Output from previous day:  Intake/Output Summary (Last 24 hours) at 11/21/14 0737 Last data filed at 11/21/14 0656  Gross per 24 hour  Intake 5973.33 ml  Output   4962 ml  Net 1011.33 ml    Intake/Output this shift:    Labs: Results for orders placed or performed during the hospital encounter of 11/20/14  CBC  Result Value Ref Range   WBC 12.7 (H) 4.0 - 10.5 K/uL   RBC 3.46 (L) 3.87 - 5.11 MIL/uL   Hemoglobin 8.8 (L) 12.0 - 15.0 g/dL   HCT 40.929.8 (L) 81.136.0 - 91.446.0 %   MCV 86.1 78.0 - 100.0 fL   MCH 25.4 (L) 26.0 - 34.0 pg   MCHC 29.5 (L) 30.0 - 36.0 g/dL   RDW 78.218.8 (H) 95.611.5 - 21.315.5 %   Platelets 326 150 - 400 K/uL  Basic metabolic panel  Result Value Ref Range   Sodium 141 135 - 145 mmol/L   Potassium 4.1 3.5 - 5.1 mmol/L   Chloride 107 96 - 112 mmol/L   CO2 25 19 - 32 mmol/L   Glucose, Bld 162 (H) 70 - 99 mg/dL   BUN 12 6 - 23 mg/dL   Creatinine, Ser 0.861.00 0.50 - 1.10 mg/dL   Calcium 9.0 8.4 - 57.810.5 mg/dL   GFR calc non Af Amer 59 (L) >90 mL/min   GFR calc Af Amer 68 (L) >90 mL/min   Anion gap 9 5 - 15    Exam - Neurologically intact ABD soft Neurovascular intact Sensation intact distally Intact pulses distally Dorsiflexion/Plantar flexion intact Incision: dressing C/D/I and no  drainage No cellulitis present Compartment soft no calf pain, negative homans, no sign of DVT Dressing - clean, dry, no drainage Motor function intact - moving foot and toes well on exam.  Hemovac pulled without difficulty, dressing reinforced  Assessment/Plan: 1 Day Post-Op Procedure(s) (LRB): RIGHT TOTAL KNEE ARTHROPLASTY (Right)  Advance diet Up with therapy D/C IV fluids Past Medical History  Diagnosis Date  . Hypertension   . Hyperlipidemia   . Obesity   . GERD (gastroesophageal reflux disease)     RARE- OTC ANTACID IF NEEDED--PAST HX OF ULCER  . 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  PT ALSO HAS LOWER BACK PAIN AT TIMES  . Anxiety   . Depression   . Kidney stones     PT PASSED STONES--NO KNOWN STONES AT PRESENT TIMES  . Urinary incontinence   . Ulcer   . Abnormality of gait 10/02/2013  . Lumbago 10/02/2013  . Obesity   . Periodic limb movement disorder (PLMD) 12/30/2013  . Dysrhythmia     PT STATES IRREGULAR HB FELT TO BE RELATED TO STRESS--NEGATIVE CARDIAC WORK UP IN 2011 WITH  DR. Morene Antu CARDIOLOGY --NUCLEAR STRESS TEST, ECHO AND HOLTER  MONITOR RESULTS IN EPIC   . Sleep apnea 02/10/2012  . OSA (obstructive sleep apnea) 12/30/2013    Very mild OSA with REM accentuation,  tested 11-29-13 at [facility] sleep, Dr Frances Furbish .   Marland Kitchen Restless leg syndrome   . Shortness of breath dyspnea   . Headache     wakes up with headache     DVT Prophylaxis - Xarelto Protocol Weight-Bearing as tolerated to Right leg No vaccines. Hgb 8.8 this AM, came in at 10.9, watch Hgb/Hct, if below 8 recommend transfusion Plan D/C home with HHPT when ready, likely Sunday Will discuss with Dr. Elissa Lovett, Dayna Barker. 11/21/2014, 7:37 AM

## 2014-11-21 NOTE — Op Note (Signed)
[name], [name] NO.:  [id number]  MEDICAL RECORD NO.:  [id number]  LOCATION:  1615                         FACILITY:  [facility]  PHYSICIAN:  [name], M.D.    DATE OF BIRTH:  [birth year]  DATE OF PROCEDURE:  11/20/2014 DATE OF DISCHARGE:                              OPERATIVE REPORT   PREOPERATIVE DIAGNOSIS:  Degenerative joint disease, right knee; end- stage bone-on-bone arthrosis with varus deformity, right knee.  POSTOPERATIVE DIAGNOSIS:  Degenerative joint disease, right knee; end- stage bone-on-bone arthrosis with varus deformity, right knee.  PROCEDURE PERFORMED:  Right total knee arthroplasty.  Increased difficulty due to the patient's elevated BMI, she was 120 kg.  ANESTHESIA:  General.  ASSISTANT:  Lanna Poche, PA  COMPONENTS:  DePuy rotating platform, 4 femur, 4 tibia, 12.5 mm insert, 38 patella.  HISTORY:  A 63, end-stage osteoarthrosis of knee, bone-on-bone arthrosis, severe disabling affecting her activities of daily living, refractory conservative treatment, indicated for replacement of the degenerated joint.  Risks and benefits discussed including bleeding, infection, damage to neurovascular structures, DVT, PE, anesthetic complications, etc.  Suboptimal range of motion, need for revision in the future.  TECHNIQUE:  With the patient in supine position, after induction of adequate general anesthesia, 3 g Kefzol, the right lower extremity was prepped and draped and exsanguinated in usual sterile fashion.  Thigh tourniquet inflated to 300 mmHg.  Midline incision was then made, full- thickness, subcutaneous tissue was dissected.  Electrocautery was utilized to achieve hemostasis.  The patient had some generalized bleeding.  After initial temp, lowering the blood pressure, I then deflated the tourniquet, we re-exsanguinated lower extremity.  She has a very large leg, used 2 Esmarch, reinflated the tourniquet 300 mmHg and we then  proceeded.  After median parapatellar arthrotomy was performed, patella everted, knee flexed.  Tricompartmental severe osteoarthrosis was noted.  Rongeur utilized to remove the osteophytes.  Remnant medial lateral menisci were removed.  Bone-on-bone arthrosis was noted tricompartment, step drill utilized to enter the femoral canal, was irrigated, 5 degree right, 10 off the distal femur was used for the distal femoral cut pins, performed our cut.  Again, generalized mild bleeding through the bone, requested continued optimization of blood pressure to decrease the bleeding.  Next, we sized off the anterior cortex which was 4, pinned it, 3 degrees of external rotation performed the anterior-posterior and chamfer cuts.  We then turned our attention towards the tibia, tibia was subluxed, external alignment guide bisecting tibiotalar joint, slight slope, 2 degrees parallel to the shaft, 2 off the low side, which was medially, pinned it, and used our oscillating saw to perform our cut.  We protected the soft tissues throughout the case with [facility] retractors.  Next, we used our flexion extension blocks, they were equivalent.  We turned our attention towards completing the tibia, was sized it maximally to a 4, distal to medial aspect of the tibial tubercle was pinned, we drilled it and used our thin punch guide, then turned our attention toward the femur with a box cut bisecting the canal just flexed the lateral aspect of the femur, pinned it, performed our box cut.  We then put a trial 4 femur  and tibia, and a 12.5 insert with full extension, full flexion, good stability, varus and valgus stressing.  We then turned our attention towards the patella, was measured to a 23 and we planed 7.5 from this, with a patellar jig.  Then measured 38, drilled our patellar holes and then placed a trial patella.  We had excellent patellofemoral tracking with this.  All instrumentation was removed.  Again  generalized cancellous bleeding.  Copiously irrigated the wound with pulsatile lavage.  We checked posteriorly, cauterized the geniculate, popliteus was intact.  Flexed the knee, dried all surfaces, used our Mikhail, mixed cement on the back table in the appropriate fashion, doing some generalized cancellous bleeding, increased tourniquet very briefly 350 and then injected the cement into the tibial canal, digitally pressurizing it.  We impacted the tibia.  Tibial component was cemented and the femur impacted that, we reduced it and held an axial load throughout the curing of the cement.  We cemented the patellar button as well.  Once we had placed her in extension, applied an axial load and applied our patellar button.  We put the tourniquet back to 300, it was approximately 5 minutes.  I then placed antibiotic irrigation in the wound.  Allowed for the appropriate curing of the cement.  Then used Marcaine with epinephrine and injected in the periarticular tissues. Again, we continued to have challenges with keeping her blood pressure to the point that would restrict the generalized bleeding into the cancellous bone.  Following appropriate curing of the cement, we flexed the knee, we used osteotome and meticulously removed redundant cement. Posteriorly as well.  After this was performed, we had tried the 12.5 insert, we had excellent extension and flexion and good stability varus valgus stressing.  We chose the 12.5 insert.  We subluxed the tibia.  We inserted the 12.5 polyethylene insert, reduced it, had full extension, full flexion, good stability with varus valgus stressing 0 to 30 degrees.  Negative anterior drawer.  Excellent patellofemoral tracking. Placed bone wax previously on the cancellous surfaces, placed the Hemovac, and brought out through a lateral stab wound onto the skin. Slight flexion.  We reapproximated the patellar tendon with #1 Vicryl interrupted figure-of-eight  sutures and a running V-Loc.  Again, we had full flexion and extension and good stability varus valgus stressing 0 to 30 degrees.  Began experiencing generalized oozing and the pressure was elevated.  We therefore proceeded with closing with staples.  Wound was dressed sterilely, secured with an Ace bandage.  Tourniquet was deflated and adequate vascularization in the lower extremity appreciated.  The patient tolerated the procedure well.  No complications.  She had flexion to gravity at 90 degrees.  We checked the soft tissues posteriorly at all times throughout the case with [facility] retract[facility].  Debrided with difficulty, elevated due to the patient's elevated BMI.  Blood loss 300 mL.  Tourniquet time was 90 minutes.     Yvette EveryJeffrey Hunter Bachar, M.D.     Cordelia PenJB/MEDQ  D:  11/20/2014  T:  11/21/2014  Job:  [id number]

## 2014-11-21 NOTE — Progress Notes (Signed)
CSW consulted for SNF placement . Spoke with PA and reviewed chart. Pt plans to go home following hospital d/c. RNCM will assist with d/c planning.  Cori RazorJamie Aaliyah Cancro LCSW [phone]

## 2014-11-21 NOTE — Evaluation (Signed)
Occupational Therapy Evaluation Patient Details Name: [name] MRN: [id number] DOB: Jan 13, 1951 Today's Date: 11/21/2014    History of Present Illness R TKR   Clinical Impression   This 64 year old female was admitted for the above surgery. She will benefit from skilled OT to work on bathroom transfers and sit to stand for ADLs.  Pt will have assistance with ADLs and does not want to focus on this at the present time.      Follow Up Recommendations  Supervision/Assistance - 24 hour; HHOT vs SNF--depending upon progress and if one person can safely assist pt with sit to stand.  Pt has arranged 24/7 help   Equipment Recommendations  None recommended by OT    Recommendations for Other Services       Precautions / Restrictions Precautions Precautions: Fall;Knee Required Braces or Orthoses: Knee Immobilizer - Right Knee Immobilizer - Right: Discontinue once straight leg raise with < 10 degree lag Restrictions Weight Bearing Restrictions: No Other Position/Activity Restrictions: WBAT      Mobility Bed Mobility Overal bed mobility: Needs Assistance Bed Mobility: Sit to Supine       Sit to supine: Mod assist;+2 for physical assistance;+2 for safety/equipment   General bed mobility comments: cues for sequence and use of L LE to self assist  Transfers Overall transfer level: Needs assistance Equipment used: Rolling walker (2 wheeled) Transfers: Sit to/from Stand Sit to Stand: Max assist;+2 safety/equipment;+2 physical assistance         General transfer comment: cues for LE management and use of UEs to self assist    Balance                                            ADL Overall ADL's : Needs assistance/impaired                         Toilet Transfer: Maximal assistance;+2 for physical assistance;Stand-pivot (max A x 2 for sit to stand--chair to bed)             General ADL Comments: Pt was in pain and wanted to get back  to bed.  She is able to perform UB adls with set up and needs max A x 2 for LB ADLs.  She plans to have help with ADLs at home and plans to simplify by wearing skirts without underwear.  Explained use of reacher, which she has.  Will focus on bathroom transfers. Educated on [employer]     Vision     Perception     Praxis      Pertinent Vitals/Pain Pain Assessment: 0-10 Pain Score: 8  Pain Location: R knee Pain Descriptors / Indicators: Aching Pain Intervention(s): Limited activity within patient's tolerance;Monitored during session;Repositioned;Ice applied     Hand Dominance     Extremity/Trunk Assessment Upper Extremity Assessment Upper Extremity Assessment: Overall WFL for tasks assessed           Communication Communication Communication: No difficulties   Cognition Arousal/Alertness: Awake/alert Behavior During Therapy: WFL for tasks assessed/performed Overall Cognitive Status: Within Functional Limits for tasks assessed                     General Comments       Exercises       Shoulder Instructions      Home Living Family/patient  expects to be discharged to:: Private residence Living Arrangements: Alone Available Help at Discharge: Family;Friend(s)               Bathroom Shower/Tub: Walk-in Soil scientistshower   Bathroom Toilet: Handicapped height     Home Equipment: Bedside commode;Shower seat          Prior Functioning/Environment Level of Independence: Independent;Independent with assistive device(s)             OT Diagnosis: Generalized weakness   OT Problem List: Decreased strength;Decreased activity tolerance;Decreased knowledge of precautions;Pain;Decreased knowledge of use of DME or AE   OT Treatment/Interventions: Self-care/ADL training;DME and/or AE instruction;Patient/family education    OT Goals(Current goals can be found in the care plan section) Acute Rehab OT Goals Patient Stated Goal: home OT Goal Formulation: With  patient Time For Goal Achievement: 11/28/14 Potential to Achieve Goals: Good ADL Goals Pt Will Transfer to Toilet: with mod assist;ambulating;bedside commode (mod A for sit to stand ) Pt Will Perform Toileting - Clothing Manipulation and hygiene: with min assist;sit to/from stand (mod A for sit to stand) Pt Will Perform Tub/Shower Transfer: Shower transfer;ambulating;3 in 1;with min assist (mod A for sit to stand)  OT Frequency: Min 2X/week   Barriers to D/C:            Co-evaluation PT/OT/SLP Co-Evaluation/Treatment: Yes Reason for Co-Treatment: For patient/therapist safety PT goals addressed during session: Mobility/safety with mobility OT goals addressed during session: ADL's and self-care      End of Session    Activity Tolerance:  limited by pain Patient left:  in bed with call bell within reach   Time: 1335-1405 OT Time Calculation (min): 30 min Charges:  OT General Charges $OT Visit: 1 Procedure OT Evaluation $Initial OT Evaluation Tier I: 1 Procedure G-Codes:    Ellias Mcelreath 11/21/2014, 3:35 PM Marica OtterMaryellen Dodi Leu, OTR/L [phone] 11/21/2014

## 2014-11-21 NOTE — Progress Notes (Signed)
Physical Therapy Treatment Patient Details Name: [name] MRN: [id number] DOB: 1950/12/05 Today's Date: 11/21/2014    History of Present Illness R TKR    PT Comments    Pt ltd this pm by pain and max assist to stand from chair.  Pt refusing SNF and insisting on d./c home with friend.  Will review dc plans with pt tomorrow.  Follow Up Recommendations  Home health PT     Equipment Recommendations  None recommended by PT    Recommendations for Other Services OT consult     Precautions / Restrictions Precautions Precautions: Fall;Knee Required Braces or Orthoses: Knee Immobilizer - Right Knee Immobilizer - Right: Discontinue once straight leg raise with < 10 degree lag Restrictions Weight Bearing Restrictions: No Other Position/Activity Restrictions: WBAT    Mobility  Bed Mobility Overal bed mobility: Needs Assistance Bed Mobility: Sit to Supine       Sit to supine: Mod assist;+2 for physical assistance;+2 for safety/equipment   General bed mobility comments: cues for sequence and use of L LE to self assist  Transfers Overall transfer level: Needs assistance Equipment used: Rolling walker (2 wheeled) Transfers: Sit to/from Stand Sit to Stand: Max assist;+2 safety/equipment;+2 physical assistance         General transfer comment: cues for LE management and use of UEs to self assist  Ambulation/Gait Ambulation/Gait assistance: Mod assist;+2 physical assistance;+2 safety/equipment Ambulation Distance (Feet): 2 Feet Assistive device: Rolling walker (2 wheeled) Gait Pattern/deviations: Step-to pattern;Decreased step length - right;Decreased step length - left;Shuffle;Trunk flexed Gait velocity: decr   General Gait Details: cues for sequence, posture, position from RW and increased UE WB   Stairs            Wheelchair Mobility    Modified Rankin (Stroke Patients Only)       Balance                                     Cognition Arousal/Alertness: Awake/alert Behavior During Therapy: WFL for tasks assessed/performed Overall Cognitive Status: Within Functional Limits for tasks assessed                      Exercises      General Comments        Pertinent Vitals/Pain Pain Assessment: 0-10 Pain Score: 8  Pain Location: R knee Pain Descriptors / Indicators: Aching;Sore Pain Intervention(s): Limited activity within patient's tolerance;Monitored during session;Premedicated before session;Ice applied    Home Living                      Prior Function            PT Goals (current goals can now be found in the care plan section) Acute Rehab PT Goals Patient Stated Goal: Resume previous lifestyle with decreased pain PT Goal Formulation: With patient Time For Goal Achievement: 11/28/14 Potential to Achieve Goals: Good Progress towards PT goals: Progressing toward goals    Frequency  7X/week    PT Plan Current plan remains appropriate    Co-evaluation PT/OT/SLP Co-Evaluation/Treatment: Yes Reason for Co-Treatment: For patient/therapist safety PT goals addressed during session: Mobility/safety with mobility       End of Session Equipment Utilized During Treatment: Gait belt;Right knee immobilizer Activity Tolerance: Patient limited by fatigue;Patient limited by pain Patient left: in bed;with call bell/phone within reach     Time: 1357-1415 PT Time  Calculation (min) (ACUTE ONLY): 18 min  Charges:  $Therapeutic Activity: 8-22 mins                    G Codes:      Zeyna Mkrtchyan 11/21/2014, 2:42 PM

## 2014-11-21 NOTE — Evaluation (Signed)
Physical Therapy Evaluation Patient Details Name: Yvette ShutterCarolyn I Horsford MRN: [id number] DOB: 1950-12-05 Today's Date: 11/21/2014   History of Present Illness  R TKR  Clinical Impression  Pt s/p R TKR presents with decreased R LE strength/ROM, post op pain, premorbid back issues and obesity limiting functional mobility.  Pt plans d.c home with 24/7 assist of friends and HHPT follow up.    Follow Up Recommendations Home health PT    Equipment Recommendations  None recommended by PT    Recommendations for Other Services OT consult     Precautions / Restrictions Precautions Precautions: Fall;Knee Required Braces or Orthoses: Knee Immobilizer - Right Knee Immobilizer - Right: Discontinue once straight leg raise with < 10 degree lag Restrictions Weight Bearing Restrictions: No Other Position/Activity Restrictions: WBAT      Mobility  Bed Mobility Overal bed mobility: Needs Assistance Bed Mobility: Supine to Sit     Supine to sit: Mod assist     General bed mobility comments: cues for sequence and use of L LE to self assist  Transfers Overall transfer level: Needs assistance Equipment used: Rolling walker (2 wheeled) Transfers: Sit to/from Stand Sit to Stand: Min assist;Mod assist;+2 physical assistance         General transfer comment: cues for LE management and use of UEs to self assist  Ambulation/Gait Ambulation/Gait assistance: Mod assist;+2 physical assistance;+2 safety/equipment Ambulation Distance (Feet): 6 Feet Assistive device: Rolling walker (2 wheeled) Gait Pattern/deviations: Step-to pattern;Decreased step length - right;Decreased step length - left;Shuffle;Antalgic Gait velocity: decr   General Gait Details: cues for sequence, posture, position from RW and increased UE WB  Stairs            Wheelchair Mobility    Modified Rankin (Stroke Patients Only)       Balance                                              Pertinent Vitals/Pain Pain Assessment: 0-10 Pain Score: 6  Pain Location: R knee Pain Descriptors / Indicators: Aching;Sore Pain Intervention(s): Limited activity within patient's tolerance;Monitored during session;Premedicated before session;Ice applied    Home Living Family/patient expects to be discharged to:: Private residence Living Arrangements: Alone Available Help at Discharge: Family;Friend(s) Type of Home: House Home Access: Ramped entrance     Home Layout: One level Home Equipment: Walker - 2 wheels      Prior Function Level of Independence: Independent;Independent with assistive device(s)         Comments: Pt ltd by back     Hand Dominance        Extremity/Trunk Assessment   Upper Extremity Assessment: Overall WFL for tasks assessed           Lower Extremity Assessment: RLE deficits/detail RLE Deficits / Details: 2-/5 quads with AAROM at knee -10 - 40       Communication   Communication: No difficulties  Cognition Arousal/Alertness: Awake/alert Behavior During Therapy: WFL for tasks assessed/performed Overall Cognitive Status: Within Functional Limits for tasks assessed                      General Comments      Exercises Total Joint Exercises Ankle Circles/Pumps: Both;Supine;10 reps;AAROM;AROM Quad Sets: AROM;Both;10 reps;Supine Heel Slides: AAROM;Right;10 reps;Supine Straight Leg Raises: AAROM;Right;10 reps      Assessment/Plan    PT Assessment Patient needs  continued PT services  PT Diagnosis Difficulty walking   PT Problem List Decreased strength;Decreased range of motion;Decreased activity tolerance;Decreased mobility;Decreased knowledge of use of DME;Obesity;Pain  PT Treatment Interventions DME instruction;Gait training;Functional mobility training;Therapeutic activities;Therapeutic exercise;Patient/family education   PT Goals (Current goals can be found in the Care Plan section) Acute Rehab PT Goals Patient  Stated Goal: Resume previous lifestyle with decreased pain PT Goal Formulation: With patient Time For Goal Achievement: 11/28/14 Potential to Achieve Goals: Good    Frequency 7X/week   Barriers to discharge        Co-evaluation               End of Session Equipment Utilized During Treatment: Gait belt;Right knee immobilizer Activity Tolerance: Patient limited by fatigue;Patient limited by pain Patient left: in chair;with call bell/phone within reach;with nursing/sitter in room Nurse Communication: Mobility status         Time: 0936-1020 PT Time Calculation (min) (ACUTE ONLY): 44 min   Charges:   PT Evaluation $Initial PT Evaluation Tier I: 1 Procedure PT Treatments $Gait Training: 8-22 mins $Therapeutic Exercise: 8-22 mins   PT G Codes:        Chessie Neuharth Dec 01, 2014, 12:18 PM

## 2014-11-21 NOTE — Discharge Instructions (Signed)
Elevate leg above heart 6x a day for 20minutes each Use knee immobilizer while walking until can SLR x 10 Use knee immobilizer in bed to keep knee in extension Aquacel dressing may remain in place until follow up. May shower with aquacel dressing in place. If the dressing becomes saturated or peels off, you may remove aquacel dressing. Place new dressing with gauze and tape or ACE bandage which should be kept clean and dry and changed daily.  =======================================================  Information on my medicine - XARELTO (Rivaroxaban)  This medication education was reviewed with me or my healthcare representative as part of my discharge preparation.    Why was Xarelto prescribed for you? Xarelto was prescribed for you to reduce the risk of blood clots forming after orthopedic surgery. The medical term for these abnormal blood clots is venous thromboembolism (VTE).  What do you need to know about xarelto ? Take your Xarelto ONCE DAILY at the same time every day. You may take it either with or without food.  If you have difficulty swallowing the tablet whole, you may crush it and mix in applesauce just prior to taking your dose.  Take Xarelto exactly as prescribed by your doctor and DO NOT stop taking Xarelto without talking to the doctor who prescribed the medication.  Stopping without other VTE prevention medication to take the place of Xarelto may increase your risk of developing a clot.  After discharge, you should have regular check-up appointments with your healthcare provider that is prescribing your Xarelto.    What do you do if you miss a dose? If you miss a dose, take it as soon as you remember on the same day then continue your regularly scheduled once daily regimen the next day. Do not take two doses of Xarelto on the same day.   Important Safety Information A possible side effect of Xarelto is bleeding. You should call your healthcare provider right  away if you experience any of the following: ? Bleeding from an injury or your nose that does not stop. ? Unusual colored urine (red or dark brown) or unusual colored stools (red or black). ? Unusual bruising for unknown reasons. ? A serious fall or if you hit your head (even if there is no bleeding).  Some medicines may interact with Xarelto and might increase your risk of bleeding while on Xarelto. To help avoid this, consult your healthcare provider or pharmacist prior to using any new prescription or non-prescription medications, including herbals, vitamins, non-steroidal anti-inflammatory drugs (NSAIDs) and supplements.  This website has more information on Xarelto: [email or web address].xarelto.[email or web address].

## 2014-11-22 LAB — CBC
HCT: 31.9 % — ABNORMAL LOW (ref 36.0–46.0)
Hemoglobin: 9.4 g/dL — ABNORMAL LOW (ref 12.0–15.0)
MCH: 25.7 pg — ABNORMAL LOW (ref 26.0–34.0)
MCHC: 29.5 g/dL — ABNORMAL LOW (ref 30.0–36.0)
MCV: 87.2 fL (ref 78.0–100.0)
Platelets: 367 10*3/uL (ref 150–400)
RBC: 3.66 MIL/uL — AB (ref 3.87–5.11)
RDW: 19.1 % — ABNORMAL HIGH (ref 11.5–15.5)
WBC: 15.6 10*3/uL — ABNORMAL HIGH (ref 4.0–10.5)

## 2014-11-22 NOTE — Progress Notes (Addendum)
Occupational Therapy Treatment Patient Details Name: [name] MRN: [id number] DOB: 1951/05/30 Today's Date: 11/22/2014    History of present illness R TKR   OT comments  Pt still requiring +2 for safety/assist and feel she will benefit from SNF at d/c. Practiced toilet transfer into the bathroom to the 3in1. Needs increased time and mod cues for safety with hand placement and LE management. She tends to be forward flexed and requires cues also for walker sequence and proper distance of self to walker. She reports her UEs fatigue with increased activity time and she also tends to "plop back" without reaching her hands back for surface to sit.  Will continue to follow on acute to progress independence and safety with ADL.    Follow Up Recommendations  SNF;Supervision/Assistance - 24 hour    Equipment Recommendations  None recommended by OT    Recommendations for Other Services      Precautions / Restrictions Precautions Precautions: Fall;Knee Required Braces or Orthoses: Knee Immobilizer - Right Knee Immobilizer - Right: Discontinue once straight leg raise with < 10 degree lag Restrictions Weight Bearing Restrictions: No Other Position/Activity Restrictions: WBAT       Mobility Bed Mobility Overal bed mobility: Needs Assistance Bed Mobility: Sit to Supine       Sit to supine: Mod assist;+2 for physical assistance;+2 for safety/equipment      Transfers Overall transfer level: Needs assistance Equipment used: Rolling walker (2 wheeled) Transfers: Sit to/from Stand Sit to Stand: Mod assist;+2 safety/equipment         General transfer comment: cues for hand placement and LE management. Pt tends to "plop back" despite cues to reach back for surface to sit.     Balance                                   ADL Overall ADL's : Needs assistance/impaired                         Toilet Transfer: Moderate assistance;+2 for  safety/equipment;Ambulation;BSC;RW;+2 for physical assistance   Toileting- Clothing Manipulation and Hygiene: +2 for safety/equipment;Maximal assistance;Sit to/from stand Toileting - Clothing Manipulation Details (indicate cue type and reason): pt able to reach front periarea to perform hygiene in sitting but needed assist to reposition R LE on trashcan to help with positioning better for cleaning.        General ADL Comments: Pt stating she is open to considering rehab at d/c and agreeable to talk with Child psychotherapist. Pt needs still +2 for safety and states her UEs fatigue with increased time and weightbearing on walker with activity. She tends to "plop back" on commode or at EOB due to fatigue without reaching for surface to help descend. Therefore she needs assist to help descend safely to commode/EOB. Feel she will benefit from SNF at d/c to increase safety and activity tolerance for performing ADL.       Vision                     Perception     Praxis      Cognition   Behavior During Therapy: [facility] for tasks assessed/performed Overall Cognitive Status: Within Functional Limits for tasks assessed                       Extremity/Trunk Assessment  Exercises     Shoulder Instructions       General Comments      Pertinent Vitals/ Pain       Pain Assessment: 0-10 Pain Score: 7  Pain Location: R knee Pain Descriptors / Indicators: Aching Pain Intervention(s): Repositioned;Ice applied;Monitored during session  Home Living                                          Prior Functioning/Environment              Frequency Min 2X/week     Progress Toward Goals  OT Goals(current goals can now be found in the care plan section)  Progress towards OT goals: Progressing toward goals     Plan Discharge plan remains appropriate    Co-evaluation    PT/OT/SLP Co-Evaluation/Treatment: Yes Reason for Co-Treatment: For  patient/therapist safety   OT goals addressed during session: ADL's and self-care;Proper use of Adaptive equipment and DME      End of Session Equipment Utilized During Treatment: Gait belt;Rolling walker;Right knee immobilizer CPM Right Knee CPM Right Knee: On   Activity Tolerance Patient limited by pain   Patient Left in bed;with call bell/phone within reach   Nurse Communication          Time: 1341-1413 OT Time Calculation (min): 32 min  Charges: OT General Charges $OT Visit: 1 Procedure OT Treatments $Therapeutic Activity: 8-22 mins  [name], [name]  [phone] 11/22/2014, 4:02 PM

## 2014-11-22 NOTE — Progress Notes (Signed)
Clinical Social Work Department BRIEF PSYCHOSOCIAL ASSESSMENT 11/22/2014  Patient:  [name], [name]     Account Number:  [id number]     Admit date:  11/20/2014  Clinical Social Worker:  Lacie Scotts  Date/Time:  11/22/2014 03:52 PM  Referred by:  Physician  Date Referred:  11/22/2014 Referred for  SNF Placement   Other Referral:   Interview type:  Patient Other interview type:    PSYCHOSOCIAL DATA Living Status:  FRIEND(S) Admitted from facility:   Level of care:   Primary support name:  Dia Crawford Primary support relationship to patient:  FRIEND Degree of support available:   supportive    CURRENT CONCERNS Current Concerns  Post-Acute Placement   Other Concerns:    SOCIAL WORK ASSESSMENT / PLAN Pt is a 64 yr old female living at home prior to hospitalization. CSW met with pt to assist with d/c planning. This is a planned admission. Pt was hoping to return home following hospital d/c. PT is recommending [facility]. SNF search has been initiated and bed offers are pending. PASRR # has been requested. manual reviewed is needed. PASRR # may not be available until MON. Pt cannot d/c to SNF without a PASRR #.   Assessment/plan status:  Psychosocial Support/Ongoing Assessment of Needs Other assessment/ plan:   Information/referral to community resources:   Insurance coverage for SNF and ambulance transport reviewed.    PATIENT'S/FAMILY'S RESPONSE TO PLAN OF CARE: " I'm not moving a well as I had hoped. " Pt is disappointed but realizes [facility] is needed. She is looking forward to reviewing bed offers and getting to her rehab placement.    Werner Lean LCSW [phone]

## 2014-11-22 NOTE — Progress Notes (Signed)
Physical Therapy Treatment Patient Details Name: [name] MRN: [id number] DOB: 1951/04/07 Today's Date: 11/22/2014    History of Present Illness R TKR    PT Comments    Pt progressing slowly with mobility with limitations 2* fatigue, pain, premorbid deconditioning and spinal stenosis.  Pt states interested in short term rehab instead of home with assist of friend.  Follow Up Recommendations  SNF     Equipment Recommendations       Recommendations for Other Services OT consult     Precautions / Restrictions Precautions Precautions: Fall;Knee Required Braces or Orthoses: Knee Immobilizer - Right Knee Immobilizer - Right: Discontinue once straight leg raise with < 10 degree lag Restrictions Weight Bearing Restrictions: No Other Position/Activity Restrictions: WBAT    Mobility  Bed Mobility Overal bed mobility: Needs Assistance Bed Mobility: Sit to Supine       Sit to supine: Mod assist;+2 for physical assistance;+2 for safety/equipment      Transfers Overall transfer level: Needs assistance Equipment used: Rolling walker (2 wheeled) Transfers: Sit to/from Stand Sit to Stand: Mod assist;+2 safety/equipment         General transfer comment: cues for hand placement and LE management. Pt tends to "plop back" despite cues to reach back for surface to sit.   Ambulation/Gait Ambulation/Gait assistance: Mod assist;+2 physical assistance;+2 safety/equipment Ambulation Distance (Feet): 13 Feet Assistive device: Rolling walker (2 wheeled) Gait Pattern/deviations: Step-to pattern;Decreased step length - right;Decreased step length - left;Shuffle;Trunk flexed Gait velocity: decr   General Gait Details: cues for sequence, posture, position from RW and increased UE WB.  Pt ltd by pain, fatigue and UE weakness   Stairs            Wheelchair Mobility    Modified Rankin (Stroke Patients Only)       Balance                                    Cognition Arousal/Alertness: Awake/alert Behavior During Therapy: WFL for tasks assessed/performed Overall Cognitive Status: Within Functional Limits for tasks assessed                      Exercises Total Joint Exercises Ankle Circles/Pumps: Both;Supine;AROM;15 reps Quad Sets: AROM;Both;Supine;15 reps Heel Slides: AAROM;Right;Supine;15 reps Straight Leg Raises: AAROM;Right;15 reps;Supine    General Comments        Pertinent Vitals/Pain Pain Assessment: 0-10 Pain Score: 7  Pain Location: R knee Pain Descriptors / Indicators: Aching Pain Intervention(s): Repositioned;Ice applied;Monitored during session    Home Living                      Prior Function            PT Goals (current goals can now be found in the care plan section) Acute Rehab PT Goals Patient Stated Goal: Rehab and home safely PT Goal Formulation: With patient Time For Goal Achievement: 11/28/14 Potential to Achieve Goals: Good Progress towards PT goals: Progressing toward goals    Frequency  7X/week    PT Plan Discharge plan needs to be updated    Co-evaluation   Reason for Co-Treatment: For patient/therapist safety   OT goals addressed during session: ADL's and self-care;Proper use of Adaptive equipment and DME     End of Session Equipment Utilized During Treatment: Gait belt;Right knee immobilizer Activity Tolerance: Patient tolerated treatment well;Patient limited by fatigue;Patient limited  by pain Patient left: in chair;with call bell/phone within reach     Time: 1213-1243 PT Time Calculation (min) (ACUTE ONLY): 30 min  Charges:  $Gait Training: 8-22 mins $Therapeutic Exercise: 8-22 mins                    G Codes:      Demetrius Mahler 11/22/2014, 4:36 PM

## 2014-11-22 NOTE — Progress Notes (Signed)
Clinical Social Work Department CLINICAL SOCIAL WORK PLACEMENT NOTE 11/22/2014  Patient:  Enriqueta ShutterSUTPHEN,Samuel I  Account Number:  [id number] Admit date:  11/20/2014  Clinical Social Worker:  Cori RazorJAMIE Joyanna Kleman, LCSW  Date/time:  11/22/2014 04:00 PM  Clinical Social Work is seeking post-discharge placement for this patient at the following level of care:   SKILLED NURSING   (*CSW will update this form in Epic as items are completed)   11/22/2014  Patient/family provided with [facility] Department of Clinical Social Work's list of facilities offering this level of care within the geographic area requested by the patient (or if unable, by the patient's family).  11/22/2014  Patient/family informed of their freedom to choose among providers that offer the needed level of care, that participate in Medicare, Medicaid or managed care program needed by the patient, have an available bed and are willing to accept the patient.    Patient/family informed of [facility]' ownership interest in [facility], as well as of the fact that they are under no obligation to receive care at this facility.  PASARR submitted to EDS on 11/22/2014 PASARR number received on   FL2 transmitted to all facilities in geographic area requested by pt/family on  11/22/2014 FL2 transmitted to all facilities within larger geographic area on   Patient informed that his/her managed care company has contracts with or will negotiate with  certain facilities, including the following:     Patient/family informed of bed offers received:   Patient chooses bed at  Physician recommends and patient chooses bed at    Patient to be transferred to  on   Patient to be transferred to facility by  Patient and family notified of transfer on  Name of family member notified:    The following physician request were entered in Epic:   Additional Comments:  Cori RazorJamie Lyniah Fujita LCSW [phone]

## 2014-11-22 NOTE — Progress Notes (Signed)
Subjective: 2 Days Post-Op Procedure(s) (LRB): RIGHT TOTAL KNEE ARTHROPLASTY (Right) Patient reports pain as 4 on 0-10 scale.   No cp SOB  Objective: Vital signs in last 24 hours: Temp:  [97.3 F (36.3 C)-98.6 F (37 C)] 97.9 F (36.6 C) (02/27 0648) Pulse Rate:  [65-94] 94 (02/27 0648) Resp:  [16] 16 (02/27 0648) BP: (131-145)/(51-64) 131/55 mmHg (02/27 0648) SpO2:  [92 %-98 %] 92 % (02/27 0648)  Intake/Output from previous day: 02/26 0701 - 02/27 0700 In: 480 [P.O.:480] Out: 1100 [Urine:1100] Intake/Output this shift:     Recent Labs  11/21/14 0453 11/22/14 0436  HGB 8.8* 9.4*    Recent Labs  11/21/14 0453 11/22/14 0436  WBC 12.7* 15.6*  RBC 3.46* 3.66*  HCT 29.8* 31.9*  PLT 326 367    Recent Labs  11/21/14 0453  NA 141  K 4.1  CL 107  CO2 25  BUN 12  CREATININE 1.00  GLUCOSE 162*  CALCIUM 9.0   No results for input(s): LABPT, INR in the last 72 hours.  Neurologically intact ABD soft  Assessment/Plan: 2 Days Post-Op Procedure(s) (LRB): RIGHT TOTAL KNEE ARTHROPLASTY (Right)  Slow progress H/H improved Advance diet D/C IV fluids Plan for discharge tomorrow Discharge home with home health  Bernise Sylvain C 11/22/2014, 7:06 AM

## 2014-11-22 NOTE — Progress Notes (Signed)
Physical Therapy Treatment Patient Details Name: [name] MRN: [id number] DOB: May 16, 1951 Today's Date: 11/22/2014    History of Present Illness R TKR    PT Comments    Pt continues ltd by pain, fatigue and premorbid deconditioning.    Follow Up Recommendations  SNF     Equipment Recommendations  None recommended by PT    Recommendations for Other Services OT consult     Precautions / Restrictions Precautions Precautions: Fall;Knee Required Braces or Orthoses: Knee Immobilizer - Right Knee Immobilizer - Right: Discontinue once straight leg raise with < 10 degree lag Restrictions Weight Bearing Restrictions: No Other Position/Activity Restrictions: WBAT    Mobility  Bed Mobility Overal bed mobility: Needs Assistance Bed Mobility: Supine to Sit       Sit to supine: Mod assist;+2 for physical assistance;+2 for safety/equipment   General bed mobility comments: cues for sequence and use of L LE to self assist  Transfers Overall transfer level: Needs assistance Equipment used: Rolling walker (2 wheeled) Transfers: Sit to/from Stand Sit to Stand: Mod assist;+2 safety/equipment         General transfer comment: cues for hand placement and LE management. Pt tends to "plop back" despite cues to reach back for surface to sit.   Ambulation/Gait Ambulation/Gait assistance: Min assist;Mod assist;+2 safety/equipment Ambulation Distance (Feet): 14 Feet (twice) Assistive device: Rolling walker (2 wheeled) Gait Pattern/deviations: Step-to pattern;Shuffle;Trunk flexed;Decreased step length - right;Decreased step length - left Gait velocity: decr   General Gait Details: cues for sequence, posture, position from RW and increased UE WB.  Pt ltd by pain, fatigue and UE weakness   Stairs            Wheelchair Mobility    Modified Rankin (Stroke Patients Only)       Balance                                    Cognition Arousal/Alertness:  Awake/alert Behavior During Therapy: WFL for tasks assessed/performed Overall Cognitive Status: Within Functional Limits for tasks assessed                      Exercises      General Comments        Pertinent Vitals/Pain Pain Assessment: 0-10 Pain Score: 7  Pain Location: R knee Pain Descriptors / Indicators: Aching Pain Intervention(s): Limited activity within patient's tolerance;Monitored during session;Premedicated before session;Ice applied    Home Living                      Prior Function            PT Goals (current goals can now be found in the care plan section) Acute Rehab PT Goals Patient Stated Goal: Rehab and home safely PT Goal Formulation: With patient Time For Goal Achievement: 11/28/14 Potential to Achieve Goals: Good Progress towards PT goals: Progressing toward goals    Frequency  7X/week    PT Plan Current plan remains appropriate    Co-evaluation PT/OT/SLP Co-Evaluation/Treatment: Yes Reason for Co-Treatment: For patient/therapist safety PT goals addressed during session: Mobility/safety with mobility OT goals addressed during session: ADL's and self-care     End of Session Equipment Utilized During Treatment: Gait belt;Right knee immobilizer Activity Tolerance: Patient tolerated treatment well;Patient limited by fatigue;Patient limited by pain Patient left: in chair;with call bell/phone within reach     Time: 1345-1413  PT Time Calculation (min) (ACUTE ONLY): 28 min  Charges:  $Gait Training: 8-22 mins                    G Codes:      Libbi Towner 11/22/2014, 4:46 PM

## 2014-11-23 LAB — CBC
HCT: 30.4 % — ABNORMAL LOW (ref 36.0–46.0)
Hemoglobin: 9 g/dL — ABNORMAL LOW (ref 12.0–15.0)
MCH: 25.7 pg — ABNORMAL LOW (ref 26.0–34.0)
MCHC: 29.6 g/dL — ABNORMAL LOW (ref 30.0–36.0)
MCV: 86.9 fL (ref 78.0–100.0)
Platelets: 360 10*3/uL (ref 150–400)
RBC: 3.5 MIL/uL — ABNORMAL LOW (ref 3.87–5.11)
RDW: 19.1 % — ABNORMAL HIGH (ref 11.5–15.5)
WBC: 13.4 10*3/uL — AB (ref 4.0–10.5)

## 2014-11-23 MED ORDER — SODIUM CHLORIDE 0.9 % IV BOLUS (SEPSIS)
250.0000 mL | Freq: Once | INTRAVENOUS | Status: AC
  Administered 2014-11-23: 250 mL via INTRAVENOUS

## 2014-11-23 MED ORDER — METOPROLOL TARTRATE 25 MG PO TABS
25.0000 mg | ORAL_TABLET | Freq: Once | ORAL | Status: AC
  Administered 2014-11-23: 25 mg via ORAL
  Filled 2014-11-23: qty 1

## 2014-11-23 MED ORDER — DIGOXIN 125 MCG PO TABS
0.2500 mg | ORAL_TABLET | Freq: Every day | ORAL | Status: DC
  Administered 2014-11-23 – 2014-11-25 (×3): 0.25 mg via ORAL
  Filled 2014-11-23 (×2): qty 2
  Filled 2014-11-23: qty 1

## 2014-11-23 NOTE — Progress Notes (Signed)
Patient arrived from 6th floor. Place patient on telemetry immediately. Patient currently in AFIB. HR 130's. Centralized telemetry notified of history. Will continue to monitor. VSS.

## 2014-11-23 NOTE — Progress Notes (Signed)
Patient HR still not controlled. Patient HR sustaining in 130-150's AFIB.

## 2014-11-23 NOTE — Progress Notes (Signed)
At 1641 ekg results communicated to [facility] Shuford, PA, along with latest vital signs and fact that pt c[facility]o becoming diz[facility]. Pt reports she has been checked for irregular HR approx 5 yrs ago by her regular MD, Dr D. Eloise Harmanaterson; states she has no dx of a-fib. Further check on pt history in chart shows workup by [facility] in [facility]. This communicated with Ms Shuford at time of this note. She told me Dr Algie CofferKadakia to come and see pt today. Mahad Newstrom, [employer]

## 2014-11-23 NOTE — Progress Notes (Addendum)
Heart sounds per auscultation irregularly irregular, rate 82. BP 95/59. Other VSS, pt without complaints. Says that in past she has had irregular HR when excited or stressed. Will notify PA. Kain Milosevic, [employer]

## 2014-11-23 NOTE — Progress Notes (Signed)
PA Kennith Centerracey Shuford returned page. Order given for EKG. [name], [facility]vette [employer]

## 2014-11-23 NOTE — Progress Notes (Signed)
Pt transferred to 1442 for new onset a-fib. Report called to [place] @ 1800. Jemar Paulsen, [employer]

## 2014-11-23 NOTE — Progress Notes (Signed)
[name]  MRN: [id number] DOB/Age: [birth year] 64 y.o. Physician: Jacquelyne BalintKevin Supple,MD Procedure: Procedure(s) (LRB): RIGHT TOTAL KNEE ARTHROPLASTY (Right)     Subjective: Pain controlled but patient reports slow progress with PT and concerns with abilities to return home and has questions regarding short rehab stay. Has not yet worked with PT today  Vital Signs Temp:  [97.7 F (36.5 C)-100.6 F (38.1 C)] 98.5 F (36.9 C) (02/28 0620) Pulse Rate:  [69-111] 110 (02/28 0620) Resp:  [16-20] 18 (02/28 0620) BP: (105-130)/(46-93) 130/93 mmHg (02/28 0620) SpO2:  [92 %-94 %] 92 % (02/28 0620)  Lab Results  Recent Labs  11/22/14 0436 11/23/14 0527  WBC 15.6* 13.4*  HGB 9.4* 9.0*  HCT 31.9* 30.4*  PLT 367 360   BMET  Recent Labs  11/21/14 0453  NA 141  K 4.1  CL 107  CO2 25  GLUCOSE 162*  BUN 12  CREATININE 1.00  CALCIUM 9.0   INR  Date Value Ref Range Status  11/13/2014 0.95 0.00 - 1.49 Final     Exam Right knee aquacel with scant old bloody drainage. ACE removed Moving foot and ankle well        Plan PT today and depending on progress possible discharge to home, but if slow may need to consider other options. We have discussed that sometimes a day makes a huge difference in abilities but she would need to demonstrate improved transfer and oob abilities. She reports severe deconditioning preop which is a part of the problem.  Vicie Cech for Dr.Kevin Supple 11/23/2014, 9:11 AM

## 2014-11-23 NOTE — Progress Notes (Signed)
CARE MANAGEMENT NOTE 11/23/2014  Patient:  [name],[name]   Account Number:  [id number]  Date Initiated:  11/22/2014  Documentation initiated by:  ADDISON,LAWANDA  Subjective/Objective Assessment:   64 yr old female rt total knee replacement     Action/Plan:   discussed d/c HH needs   Anticipated DC Date:  11/24/2014   Anticipated DC Plan:  SKILLED NURSING FACILITY  In-house referral  Clinical Social Worker      DC Planning Services  CM consult      Choice offered to / List presented to:  [name]           Status of service:  Completed, signed off Medicare Important Message given?  YES (If response is "NO", the following Medicare IM given date fields will be blank) Date Medicare IM given:  11/23/2014 Medicare IM given by:  [facility],Darian Cansler Date Additional Medicare IM given:   Additional Medicare IM given by:    Discharge Disposition:  SKILLED NURSING FACILITY  Per UR Regulation:    If discussed at Long Length of Stay Meetings, dates discussed:    Comments:  11/22/14 1:59 pm Spoke to patient about HH and equipment needs, pt stating she was now wanting to go to rehab, spoke to PT who concurred that pt did meet SNF criteria, informed nurse and made referral to Child psychotherapistocial Worker for SNF placement.  No further H H needs at this time.  Raymond GurneyL. J. Addison RN, BSN

## 2014-11-23 NOTE — Consult Note (Signed)
Referring Physician: Adolphus Birchwoodracey Shuford, PA and Jene EveryJeffrey Beane, MD  [name] is an 64 y.o. female.                       Chief Complaint: Dizziness and palpitation  HPI: 64 year old female with hypertension, hyperlipidemia, obesity, anxiety and depression had right total knee arthroplasty on 11/20/2013. This afternoon she has fever and dizziness with palpitation. EKG showed atrial fibrillation with rapid ventricular response. Patient denies chest pain or cough and cold. TSH was normal 2 years ago.  Past Medical History  Diagnosis Date  . Hypertension   . Hyperlipidemia   . Obesity   . GERD (gastroesophageal reflux disease)     RARE- OTC ANTACID IF NEEDED--PAST HX OF ULCER  . 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  PT ALSO HAS LOWER BACK PAIN AT TIMES  . Anxiety   . Depression   . Kidney stones     PT PASSED STONES--NO KNOWN STONES AT PRESENT TIMES  . Urinary incontinence   . Ulcer   . Abnormality of gait 10/02/2013  . Lumbago 10/02/2013  . Obesity   . Periodic limb movement disorder (PLMD) 12/30/2013  . Dysrhythmia     PT STATES IRREGULAR HB FELT TO BE RELATED TO STRESS--NEGATIVE CARDIAC WORK UP IN 2011 WITH DR. [facility] --NUCLEAR STRESS TEST, ECHO AND HOLTER  MONITOR RESULTS IN EPIC   . Sleep apnea 02/10/2012  . OSA (obstructive sleep apnea) 12/30/2013    Very mild OSA with REM accentuation,  [facility] Dr Frances FurbishAthar .   Marland Kitchen. Restless leg syndrome   . Shortness of breath dyspnea   . Headache     wakes up with headache       Past Surgical History  Procedure Laterality Date  . Colon surgery    . Surgery for bowel obstruction and removal of part of stomach  in 2000  2000  . Bilateral bunionectomy 1979    . Total knee arthroplasty  02/16/2012    Procedure: TOTAL KNEE ARTHROPLASTY;  Surgeon: Javier DockerJeffrey C Beane, MD;  Location: [facility];  Service: Orthopedics;  Laterality: Left;  . Breast surgery      biopsy   . Small intestine surgery    .  Total knee arthroplasty Right 11/20/2014    Procedure: RIGHT TOTAL KNEE ARTHROPLASTY;  Surgeon: Javier DockerJeffrey C Beane, MD;  Location: [facility];  Service: Orthopedics;  Laterality: Right;    Family History  Problem Relation Age of Onset  . Sleep apnea Father   . Multiple sclerosis Sister   . Multiple sclerosis Sister    Social History:  reports that she quit smoking about 26 years ago. Her smoking use included Cigarettes. She has a 40 pack-year smoking history. She has never used smokeless tobacco. She reports that she does not drink alcohol or use illicit drugs.  Allergies:  Allergies  Allergen Reactions  . Zolpidem Tartrate Other (See Comments)    hallucinations  . Amoxicillin Rash    Medications Prior to Admission  Medication Sig Dispense Refill  . Asenapine Maleate (SAPHRIS) 10 MG SUBL Place 10 mg under the tongue at bedtime.     . Aspirin-Salicylamide-Caffeine (BC HEADACHE POWDER PO) Take 2 each by mouth daily.    Marland Kitchen. buPROPion (WELLBUTRIN XL) 150 MG 24 hr tablet Take 150 mg by mouth every morning.    . chlorthalidone (HYGROTON) 25 MG tablet Take 25 mg by  mouth daily with breakfast.    . citalopram (CELEXA) 20 MG tablet Take 20 mg by mouth at bedtime.     . cyanocobalamin (,VITAMIN B-12,) 1000 MCG/ML injection Inject 1,000 mcg into the muscle every 30 (thirty) days.    Marland Kitchen esomeprazole (NEXIUM) 20 MG capsule Take 20 mg by mouth daily at 12 noon.    . gabapentin (NEURONTIN) 600 MG tablet Take 600 mg by mouth 4 (four) times daily.    . iron polysaccharides (NIFEREX) 150 MG capsule Take 150 mg by mouth daily.    Marland Kitchen lisinopril (PRINIVIL,ZESTRIL) 20 MG tablet Take 20 mg by mouth 2 (two) times daily.    . meloxicam (MOBIC) 15 MG tablet Take 15 mg by mouth daily.    . Multiple Vitamin (MULTIVITAMIN WITH MINERALS) TABS tablet Take 1 tablet by mouth daily.    Marland Kitchen rOPINIRole (REQUIP) 0.25 MG tablet TAKE ONE TABLET BY MOUTH AT BEDTIME  90 tablet 1  . TURMERIC PO Take 900 mg by mouth daily.    .  verapamil (VERELAN PM) 120 MG 24 hr capsule Take 120 mg by mouth 2 (two) times daily.    . ciprofloxacin (CIPRO) 500 MG tablet Take 1 tablet (500 mg total) by mouth 2 (two) times daily. (Patient not taking: Reported on 11/12/2014) 10 tablet 0  . doxycycline (VIBRAMYCIN) 100 MG capsule Take 1 capsule (100 mg total) by mouth 2 (two) times daily. (Patient not taking: Reported on 11/12/2014) 20 capsule 0  . phenazopyridine (PYRIDIUM) 95 MG tablet Take 1 tablet (95 mg total) by mouth 3 (three) times daily as needed for pain. (Patient not taking: Reported on 11/12/2014) 10 tablet 0  . Vitamin D, Ergocalciferol, (DRISDOL) 50000 UNITS CAPS capsule Take 50,000 Units by mouth every 30 (thirty) days.      Results for orders placed or performed during the hospital encounter of 11/20/14 (from the past 48 hour(s))  CBC     Status: Abnormal   Collection Time: 11/22/14  4:36 AM  Result Value Ref Range   WBC 15.6 (H) 4.0 - 10.5 K/uL   RBC 3.66 (L) 3.87 - 5.11 MIL/uL   Hemoglobin 9.4 (L) 12.0 - 15.0 g/dL   HCT 16.1 (L) 09.6 - 04.5 %   MCV 87.2 78.0 - 100.0 fL   MCH 25.7 (L) 26.0 - 34.0 pg   MCHC 29.5 (L) 30.0 - 36.0 g/dL   RDW 40.9 (H) 81.1 - 91.4 %   Platelets 367 150 - 400 K/uL  CBC     Status: Abnormal   Collection Time: 11/23/14  5:27 AM  Result Value Ref Range   WBC 13.4 (H) 4.0 - 10.5 K/uL   RBC 3.50 (L) 3.87 - 5.11 MIL/uL   Hemoglobin 9.0 (L) 12.0 - 15.0 g/dL   HCT 78.2 (L) 95.6 - 21.3 %   MCV 86.9 78.0 - 100.0 fL   MCH 25.7 (L) 26.0 - 34.0 pg   MCHC 29.6 (L) 30.0 - 36.0 g/dL   RDW 08.6 (H) 57.8 - 46.9 %   Platelets 360 150 - 400 K/uL   No results found.  Review Of Systems General Not Present- Chills, Fatigue, Fever, Memory Loss, Night Sweats. Positive weight gain. Skin Not Present- Eczema, Hives, Itching, Lesions and Rash. HEENT Present- Headache and Hearing Loss. Not Present- Dentures, Double Vision, Tinnitus and Visual Loss. Respiratory Present- Shortness of breath with exertion. Not  Present- Allergies, Chronic Cough, Coughing up blood and Shortness of breath at rest. Cardiovascular Not Present- Chest Pain, Difficulty  Breathing Lying Down, Murmur, Palpitations, Racing/skipping heartbeats and Swelling. Positive hypertension. Gastrointestinal Not Present- Abdominal Pain, Bloody Stool, Constipation, Diarrhea, Difficulty Swallowing, Heartburn, Jaundice, Loss of appetitie, Nausea and Vomiting. Female Genitourinary Not Present- Blood in Urine, Discharge, Flank Pain, Incontinence, Painful Urination, Urgency, Urinary frequency, Urinary Retention, Urinating at Night and Weak urinary stream. Musculoskeletal Present- Back Pain and Morning Stiffness, bilateral knee pain. Not Present- Muscle Pain, Muscle Weakness and Spasms. Neurological Not Present- Blackout spells, Difficulty with balance, Dizziness, Paralysis, Tremor and Weakness. Psychiatric Not Present- Insomnia.  Blood pressure 101/64, pulse 139, temperature 100.4 F (38 C), temperature source Oral, resp. rate 16, height  (1.651 m), weight 119.75 kg (264 lb), SpO2 96 %. Physical Exam General:Alert, cooperative, oriented x 3 and appears stated age. No distress. Well nourished and Well developed. Head-normocephalic, atraumatic . Neck- supple, no bruit auscultated on the right, no bruit auscultated on the left. Eye: Pupil - Bilateral-Regular and Round. EOMI. Chest and Lung Exam: Breath sounds - clear at anterior chest wall and clear at posterior chest wall.  Cardiovascular: Irregular rate and rhythm. Heart Sounds - S1 WNL and S2 WNL. No Murmurs. Abdomen: non-tender to palpation. Bowel sounds are normal. Musculoskeletal :Right knee with vertical long dressing covering surgical scar. Left knee old vertical scar. CNS: Cranial nerves grossly intact.  Assessment/Plan Atrial fibrillation with rapid ventricular response, new onset. S/P Right total knee arthroplasty Hypertension Obesity Anxiety and depression Fever  Recheck  TSH, CBC and BMET in AM. Echocardiogram in AM. Tylenol for fever. Digoxin for heart rate control. Continue Xarelto, verapamil and lisinopril Transfer to telemetry.  Ricki Rodriguez, MD  11/23/2014, 5:33 PM

## 2014-11-23 NOTE — Plan of Care (Signed)
Problem: Phase II Progression Outcomes Goal: Ambulates Outcome: Progressing Slowly; see PT note

## 2014-11-23 NOTE — Progress Notes (Signed)
Physical Therapy Treatment Patient Details Name: Yvette ShutterCarolyn I Coombs MRN: [id number] DOB: 1951/03/15 Today's Date: 11/23/2014    History of Present Illness R TKR    PT Comments    Very slow progress 2* premorbid deconditioning.  Pt would greatly benefit from follow up rehab at SNF level to maximize safety and IND prior to return home with ltd assist.  Follow Up Recommendations  SNF     Equipment Recommendations  None recommended by PT    Recommendations for Other Services OT consult     Precautions / Restrictions Precautions Precautions: Fall;Knee Required Braces or Orthoses: Knee Immobilizer - Right Knee Immobilizer - Right: Discontinue once straight leg raise with < 10 degree lag Restrictions Weight Bearing Restrictions: No Other Position/Activity Restrictions: WBAT    Mobility  Bed Mobility Overal bed mobility: Needs Assistance Bed Mobility: Supine to Sit     Supine to sit: Min assist;Mod assist;HOB elevated     General bed mobility comments: cues for sequence and use of L LE to self assist  Transfers Overall transfer level: Needs assistance Equipment used: Rolling walker (2 wheeled) Transfers: Sit to/from Stand Sit to Stand: From elevated surface;Mod assist         General transfer comment: cues for hand placement and LE management. Pt tends to "plop back" despite cues to reach back for surface to sit.   Ambulation/Gait Ambulation/Gait assistance: Min assist;Mod assist;+2 physical assistance;+2 safety/equipment Ambulation Distance (Feet): 14 Feet Assistive device: Rolling walker (2 wheeled) Gait Pattern/deviations: Step-to pattern;Decreased step length - right;Decreased step length - left;Decreased stance time - right;Decreased stance time - left;Shuffle;Trunk flexed Gait velocity: decr   General Gait Details: cues for sequence, posture, position from RW and increased UE WB.  Pt ltd by pain, fatigue and UE weakness   Stairs            Wheelchair  Mobility    Modified Rankin (Stroke Patients Only)       Balance                                    Cognition Arousal/Alertness: Awake/alert Behavior During Therapy: WFL for tasks assessed/performed Overall Cognitive Status: Within Functional Limits for tasks assessed                      Exercises Total Joint Exercises Ankle Circles/Pumps: Both;Supine;AROM;20 reps Quad Sets: AROM;Both;Supine;20 reps Heel Slides: AAROM;Right;Supine;20 reps Straight Leg Raises: AAROM;Right;Supine;20 reps    General Comments        Pertinent Vitals/Pain Pain Assessment: 0-10 Pain Score: 5  Pain Location: R knee Pain Descriptors / Indicators: Aching;Sore Pain Intervention(s): Limited activity within patient's tolerance;Monitored during session;Premedicated before session;Ice applied    Home Living                      Prior Function            PT Goals (current goals can now be found in the care plan section) Acute Rehab PT Goals Patient Stated Goal: Rehab and home safely PT Goal Formulation: With patient Time For Goal Achievement: 11/28/14 Potential to Achieve Goals: Good Progress towards PT goals: Progressing toward goals    Frequency  7X/week    PT Plan Current plan remains appropriate    Co-evaluation             End of Session Equipment Utilized During Treatment: Gait belt;Right knee  immobilizer Activity Tolerance: Patient tolerated treatment well;Patient limited by fatigue;Patient limited by pain Patient left: in chair;with call bell/phone within reach     Time: 1027-1105 PT Time Calculation (min) (ACUTE ONLY): 38 min  Charges:  $Gait Training: 8-22 mins $Therapeutic Exercise: 23-37 mins                    G Codes:      Joab Carden 26-Nov-2014, 1:01 PM

## 2014-11-24 LAB — CBC WITH DIFFERENTIAL/PLATELET
BASOS ABS: 0 10*3/uL (ref 0.0–0.1)
BASOS PCT: 0 % (ref 0–1)
EOS ABS: 0 10*3/uL (ref 0.0–0.7)
Eosinophils Relative: 0 % (ref 0–5)
HEMATOCRIT: 28.4 % — AB (ref 36.0–46.0)
HEMOGLOBIN: 8.4 g/dL — AB (ref 12.0–15.0)
LYMPHS PCT: 13 % (ref 12–46)
Lymphs Abs: 1.9 10*3/uL (ref 0.7–4.0)
MCH: 25.6 pg — ABNORMAL LOW (ref 26.0–34.0)
MCHC: 29.6 g/dL — AB (ref 30.0–36.0)
MCV: 86.6 fL (ref 78.0–100.0)
MONOS PCT: 11 % (ref 3–12)
Monocytes Absolute: 1.6 10*3/uL — ABNORMAL HIGH (ref 0.1–1.0)
NEUTROS ABS: 10.9 10*3/uL — AB (ref 1.7–7.7)
NEUTROS PCT: 76 % (ref 43–77)
Platelets: 391 10*3/uL (ref 150–400)
RBC: 3.28 MIL/uL — AB (ref 3.87–5.11)
RDW: 18.9 % — AB (ref 11.5–15.5)
WBC: 14.4 10*3/uL — ABNORMAL HIGH (ref 4.0–10.5)

## 2014-11-24 LAB — BASIC METABOLIC PANEL
ANION GAP: 9 (ref 5–15)
BUN: 24 mg/dL — ABNORMAL HIGH (ref 6–23)
CALCIUM: 8.5 mg/dL (ref 8.4–10.5)
CO2: 29 mmol/L (ref 19–32)
Chloride: 99 mmol/L (ref 96–112)
Creatinine, Ser: 1.11 mg/dL — ABNORMAL HIGH (ref 0.50–1.10)
GFR, EST AFRICAN AMERICAN: 60 mL/min — AB (ref >90)
GFR, EST NON AFRICAN AMERICAN: 52 mL/min — AB (ref >90)
Glucose, Bld: 129 mg/dL — ABNORMAL HIGH (ref 70–99)
POTASSIUM: 3.3 mmol/L — AB (ref 3.5–5.1)
Sodium: 137 mmol/L (ref 135–145)

## 2014-11-24 LAB — TSH: TSH: 1.367 u[IU]/mL (ref 0.350–4.500)

## 2014-11-24 MED ORDER — OXYCODONE HCL 5 MG PO TABS: 5.0000 mg | ORAL_TABLET | ORAL | Status: DC | PRN

## 2014-11-24 MED ORDER — METOPROLOL TARTRATE 25 MG PO TABS
25.0000 mg | ORAL_TABLET | Freq: Once | ORAL | Status: AC
  Administered 2014-11-24: 25 mg via ORAL
  Filled 2014-11-24: qty 1

## 2014-11-24 MED ORDER — POTASSIUM CHLORIDE ER 10 MEQ PO TBCR
10.0000 meq | EXTENDED_RELEASE_TABLET | Freq: Three times a day (TID) | ORAL | Status: DC
  Administered 2014-11-24 – 2014-11-25 (×5): 10 meq via ORAL
  Filled 2014-11-24 (×11): qty 1

## 2014-11-24 MED ORDER — METOPROLOL TARTRATE 25 MG PO TABS
25.0000 mg | ORAL_TABLET | Freq: Two times a day (BID) | ORAL | Status: DC
  Administered 2014-11-24: 25 mg via ORAL
  Filled 2014-11-24: qty 1

## 2014-11-24 MED ORDER — METOPROLOL TARTRATE 50 MG PO TABS
50.0000 mg | ORAL_TABLET | Freq: Two times a day (BID) | ORAL | Status: DC
  Administered 2014-11-24 – 2014-11-25 (×2): 50 mg via ORAL
  Filled 2014-11-24 (×2): qty 1

## 2014-11-24 NOTE — Progress Notes (Signed)
Subjective: 4 Days Post-Op Procedure(s) (LRB): RIGHT TOTAL KNEE ARTHROPLASTY (Right) Patient reports pain as mild.  Reports her knee pain is much better controlled. OxyIR working well. Events from yesterday noted- new onset A.Fib, cardiology consulted (Dr. Algie CofferKadakia), transferred to telemetry, started on digoxin for rate control. She does still feel like her heart is racing. She feels best lying down. She also feels she would do best at SNF at this point due to limited participation in PT due to pain and deconditioning.  Objective: Vital signs in last 24 hours: Temp:  [98.5 F (36.9 C)-101.4 F (38.6 C)] 98.6 F (37 C) (02/29 0702) Pulse Rate:  [65-139] 116 (02/29 0541) Resp:  [16-18] 16 (02/29 0541) BP: (95-108)/(52-69) 108/68 mmHg (02/29 0541) SpO2:  [93 %-100 %] 97 % (02/29 0541)  Intake/Output from previous day: 02/28 0701 - 02/29 0700 In: 1820 [P.O.:1320] Out: -  Intake/Output this shift:     Recent Labs  11/22/14 0436 11/23/14 0527 11/24/14 0615  HGB 9.4* 9.0* 8.4*    Recent Labs  11/23/14 0527 11/24/14 0615  WBC 13.4* 14.4*  RBC 3.50* 3.28*  HCT 30.4* 28.4*  PLT 360 391    Recent Labs  11/24/14 0615  NA 137  K 3.3*  CL 99  CO2 29  BUN 24*  CREATININE 1.11*  GLUCOSE 129*  CALCIUM 8.5   No results for input(s): LABPT, INR in the last 72 hours.  Neurologically intact ABD soft Neurovascular intact Sensation intact distally Intact pulses distally Dorsiflexion/Plantar flexion intact Incision: dressing C/D/I and scant drainage No cellulitis present Compartment soft no sign of DVT  Aquacel with minimal dried bloody drainage Drain site dressing removed  Assessment/Plan: 4 Days Post-Op Procedure(s) (LRB): RIGHT TOTAL KNEE ARTHROPLASTY (Right) Advance diet Up with therapy D/C IV fluids  New onset A.Fib- appreciate cardiology input. Rate does seem to be improving on digoxin, at 116 this AM. Continue Xarelto for DVT ppx. Echo today per Cardiology  consult yesterday, awaiting further recommendations. Given her deconditioning and limited ability to participate in PT, she would benefit from short SNF stay when she is medically stable for D/C. She is in agreement with SNF. Continue to watch H&H- Hgb 8.4 this AM Will discuss with Dr. Elissa LovettBeane  Verniece Encarnacion, Dayna BarkerJACLYN M. 11/24/2014, 8:01 AM

## 2014-11-24 NOTE — Progress Notes (Addendum)
Physical Therapy Treatment Patient Details Name: Yvette ShutterCarolyn I Zaffino MRN: [id number] DOB: 12/07/1950 Today's Date: 11/24/2014    History of Present Illness Pt is a 64 year old female s/p R TKA with post operative new onset afib    PT Comments    Pt only able to tolerate short distance ambulation due to fatigue, reports deconditioned prior to surgery.  Pt also assisted with LE exercises in supine.  Pt plans to d/c to SNF.  Follow Up Recommendations  SNF     Equipment Recommendations  None recommended by PT    Recommendations for Other Services       Precautions / Restrictions Precautions Precautions: Fall;Knee Required Braces or Orthoses: Knee Immobilizer - Right Knee Immobilizer - Right: Discontinue once straight leg raise with < 10 degree lag Restrictions Other Position/Activity Restrictions: WBAT    Mobility  Bed Mobility Overal bed mobility: Needs Assistance Bed Mobility: Sit to Supine       Sit to supine: Min assist   General bed mobility comments: assist for R LE  Transfers Overall transfer level: Needs assistance Equipment used: Rolling walker (2 wheeled) Transfers: Sit to/from Stand Sit to Stand: Mod assist         General transfer comment: verbal cues for UE and LE positioning, assist to steady upon rise but mostly to assist with return to sitting due to decreased control with descent  Ambulation/Gait Ambulation/Gait assistance: Min assist;+2 safety/equipment Ambulation Distance (Feet): 10 Feet Assistive device: Rolling walker (2 wheeled) Gait Pattern/deviations: Step-to pattern;Antalgic Gait velocity: decr   General Gait Details: verbal cues for sequence, step length, posture, RW positioning, limited by fatigue, only able to tolerate short distance around room   Stairs            Wheelchair Mobility    Modified Rankin (Stroke Patients Only)       Balance                                    Cognition  Arousal/Alertness: Awake/alert Behavior During Therapy: WFL for tasks assessed/performed Overall Cognitive Status: Within Functional Limits for tasks assessed                      Exercises Total Joint Exercises Ankle Circles/Pumps: Both;Supine;AROM;20 reps Quad Sets: AROM;Both;Supine;10 reps Towel Squeeze: AROM;Both;10 reps Heel Slides: AAROM;Right;Supine;10 reps Hip ABduction/ADduction: AAROM;Right;10 reps Straight Leg Raises: AAROM;Right;10 reps  AAROM R knee flexion approx 85* during HS    General Comments        Pertinent Vitals/Pain Pain Assessment: 0-10 Pain Score: 5  Pain Location: R knee Pain Descriptors / Indicators: Aching;Sore Pain Intervention(s): Limited activity within patient's tolerance;Monitored during session;Repositioned;Ice applied   HR during session 106-130 bpm    Home Living                      Prior Function            PT Goals (current goals can now be found in the care plan section) Progress towards PT goals: Progressing toward goals    Frequency  7X/week    PT Plan Current plan remains appropriate    Co-evaluation             End of Session Equipment Utilized During Treatment: Gait belt;Right knee immobilizer Activity Tolerance: Patient limited by fatigue Patient left: in bed;with call bell/phone within reach  Time: 1610-9604 PT Time Calculation (min) (ACUTE ONLY): 23 min  Charges:  $Gait Training: 8-22 mins $Therapeutic Exercise: 8-22 mins                    G Codes:      Amybeth Sieg,KATHrine E 2014/12/02, 1:22 PM Zenovia Jarred, PT, DPT 12-02-2014 Pager: [phone]

## 2014-11-24 NOTE — Progress Notes (Signed)
Ref: Yvette FillersPATERSON,DANIEL G, MD   Subjective:  Feeling better. No chest pain. T max 101.4.   Objective:  Vital Signs in the last 24 hours: Temp:  [98.5 F (36.9 C)-101.4 F (38.6 C)] 98.6 F (37 C) (02/29 0702) Pulse Rate:  [65-139] 116 (02/29 0541) Cardiac Rhythm:  [-] Atrial fibrillation (02/29 0800) Resp:  [16-18] 16 (02/29 0541) BP: (95-108)/(52-69) 108/68 mmHg (02/29 0541) SpO2:  [93 %-100 %] 97 % (02/29 0541)  Physical Exam: BP Readings from Last 1 Encounters:  11/24/14 108/68    Wt Readings from Last 1 Encounters:  11/20/14 119.75 kg (264 lb)    Weight change:   HEENT: /AT, Eyes-PERL, EOMI, Conjunctiva-Pink, Sclera-Non-icteric Neck: No JVD, No bruit, Trachea midline. Lungs:  Clear, Bilateral. Cardiac:  Regular rhythm, normal S1 and S2, no S3.  Abdomen:  Soft, non-tender. Extremities:  No edema present. No cyanosis. No clubbing. CNS: AxOx3, Cranial nerves grossly intact, moves all 4 extremities. Right handed. Right knee in immobilizer. Skin: Warm and dry.   Intake/Output from previous day: 02/28 0701 - 02/29 0700 In: 1820 [P.O.:1320] Out: -     Lab Results: BMET    Component Value Date/Time   NA 137 11/24/2014 0615   NA 141 11/21/2014 0453   NA 142 11/13/2014 0920   K 3.3* 11/24/2014 0615   K 4.1 11/21/2014 0453   K 3.6 11/13/2014 0920   CL 99 11/24/2014 0615   CL 107 11/21/2014 0453   CL 110 11/13/2014 0920   CO2 29 11/24/2014 0615   CO2 25 11/21/2014 0453   CO2 23 11/13/2014 0920   GLUCOSE 129* 11/24/2014 0615   GLUCOSE 162* 11/21/2014 0453   GLUCOSE 92 11/13/2014 0920   BUN 24* 11/24/2014 0615   BUN 12 11/21/2014 0453   BUN 18 11/13/2014 0920   CREATININE 1.11* 11/24/2014 0615   CREATININE 1.00 11/21/2014 0453   CREATININE 1.05 11/13/2014 0920   CALCIUM 8.5 11/24/2014 0615   CALCIUM 9.0 11/21/2014 0453   CALCIUM 8.7 11/13/2014 0920   GFRNONAA 52* 11/24/2014 0615   GFRNONAA 59* 11/21/2014 0453   GFRNONAA 55* 11/13/2014 0920   GFRAA 60*  11/24/2014 0615   GFRAA 68* 11/21/2014 0453   GFRAA 64* 11/13/2014 0920   CBC    Component Value Date/Time   WBC 14.4* 11/24/2014 0615   RBC 3.28* 11/24/2014 0615   HGB 8.4* 11/24/2014 0615   HCT 28.4* 11/24/2014 0615   PLT 391 11/24/2014 0615   MCV 86.6 11/24/2014 0615   MCH 25.6* 11/24/2014 0615   MCHC 29.6* 11/24/2014 0615   RDW 18.9* 11/24/2014 0615   LYMPHSABS 1.9 11/24/2014 0615   MONOABS 1.6* 11/24/2014 0615   EOSABS 0.0 11/24/2014 0615   BASOSABS 0.0 11/24/2014 0615   HEPATIC Function Panel No results for input(s): PROT in the last 8760 hours.  Invalid input(s):  ALBUMIN,  AST,  ALT,  ALKPHOS,  BILIDIR,  IBILI HEMOGLOBIN A1C No components found for: HGA1C,  MPG CARDIAC ENZYMES Lab Results  Component Value Date   CKTOTAL 106 02/16/2012   CKMB 1.5 02/16/2012   TROPONINI <0.30 02/16/2012   BNP No results for input(s): PROBNP in the last 8760 hours. TSH  Recent Labs  11/24/14 0615  TSH 1.367   CHOLESTEROL No results for input(s): CHOL in the last 8760 hours.  Scheduled Meds: . acidophilus  1 capsule Oral Daily  . asenapine  10 mg Sublingual QHS  . buPROPion  150 mg Oral q morning - 10a  . citalopram  20 mg Oral QHS  . digoxin  0.25 mg Oral Daily  . docusate sodium  100 mg Oral BID  . gabapentin  600 mg Oral QID  . iron polysaccharides  150 mg Oral Daily  . metoprolol tartrate  25 mg Oral BID  . multivitamin with minerals  1 tablet Oral Daily  . pantoprazole  40 mg Oral Daily  . potassium chloride  10 mEq Oral TID  . rivaroxaban  10 mg Oral Q breakfast  . rOPINIRole  0.25 mg Oral QHS  . verapamil  120 mg Oral BID   Continuous Infusions:  PRN Meds:.acetaminophen **OR** acetaminophen, alum & mag hydroxide-simeth, bisacodyl, diphenhydrAMINE, HYDROmorphone (DILAUDID) injection, menthol-cetylpyridinium **OR** phenol, methocarbamol **OR** methocarbamol (ROBAXIN)  IV, metoCLOPramide **OR** metoCLOPramide (REGLAN) injection, ondansetron **OR** ondansetron  (ZOFRAN) IV, oxyCODONE, senna-docusate  Assessment/Plan: Atrial fibrillation with rapid ventricular response, new onset. S/P Right total knee arthroplasty Hypertension Obesity Anxiety and depression Fever Hypokalemia  Hold chlorthalidone and lisinopril for now. Add small dose metoprolol as tolerated. Check echocardiogram.     LOS: 4 days    Yvette Cobb  MD  11/24/2014, 8:56 AM

## 2014-11-24 NOTE — Progress Notes (Signed)
Physical Therapy Treatment Note    11/24/14 1500  PT Visit Information  Last PT Received On 11/24/14  Assistance Needed +2  History of Present Illness Pt is a 64 year old female s/p R TKA with post operative new onset afib  PT Time Calculation  PT Start Time (ACUTE ONLY) 1454  PT Stop Time (ACUTE ONLY) 1510  PT Time Calculation (min) (ACUTE ONLY) 16 min  Subjective Data  Subjective Pt reports too fatigued and in pain despite premedication to perform mobility however wished to perform exercises in again in supine.  Precautions  Precautions Fall;Knee  Required Braces or Orthoses Knee Immobilizer - Right  Knee Immobilizer - Right Discontinue once straight leg raise with < 10 degree lag  Restrictions  Other Position/Activity Restrictions WBAT  Pain Assessment  Pain Score (not rated but states better since receiving pain meds earlie)  Pain Location R knee  Pain Descriptors / Indicators Aching  Pain Intervention(s) Limited activity within patient's tolerance;Monitored during session;Ice applied;Premedicated before session  Cognition  Arousal/Alertness Awake/alert  Behavior During Therapy WFL for tasks assessed/performed  Overall Cognitive Status Within Functional Limits for tasks assessed  Exercises  Exercises Total Joint  Total Joint Exercises  Ankle Circles/Pumps Both;Supine;AROM;20 reps  Quad Sets AROM;Both;Supine;10 reps  Heel Slides AAROM;Right;Supine;10 reps  Short [employer] AAROM;Right;15 reps  Hip ABduction/ADduction AAROM;Right;10 reps  PT - End of Session  Activity Tolerance Patient limited by pain;Patient limited by fatigue  Patient left in bed;with call bell/phone within reach  PT - Assessment/Plan  PT Plan Current plan remains appropriate  PT Frequency (ACUTE ONLY) 7X/week  Follow Up Recommendations SNF  PT equipment None recommended by PT  PT Goal Progression  Progress towards PT goals Progressing toward goals  PT General Charges  $$ ACUTE PT VISIT 1 Procedure   PT Treatments  $Therapeutic Exercise 8-22 mins   Zenovia JarredKati Kestrel Mis, PT, DPT 11/24/2014 Pager: [phone]

## 2014-11-24 NOTE — Progress Notes (Signed)
[facility] when ready for discharge.    Clinical Social Work Department CLINICAL SOCIAL WORK PLACEMENT NOTE 11/24/2014  Patient:  Enriqueta ShutterSUTPHEN,Erlean I  Account Number:  [id number] Admit date:  11/20/2014  Clinical Social Worker:  Cori RazorJAMIE HAIDINGER, LCSW  Date/time:  11/22/2014 04:00 PM  Clinical Social Work is seeking post-discharge placement for this patient at the following level of care:   SKILLED NURSING   (*CSW will update this form in Epic as items are completed)   11/22/2014  Patient/family provided with [facility] Department of Clinical Social Work's list of facilities offering this level of care within the geographic area requested by the patient (or if unable, by the patient's family).  11/22/2014  Patient/family informed of their freedom to choose among providers that offer the needed level of care, that participate in Medicare, Medicaid or managed care program needed by the patient, have an available bed and are willing to accept the patient.  11/24/2014  Patient/family informed of [facility]' ownership interest in [facility], as well as of the fact that they are under no obligation to receive care at this facility.  PASARR submitted to EDS on 11/22/2014 PASARR number received on 11/24/2014  FL2 transmitted to all facilities in geographic area requested by pt/family on  11/22/2014 FL2 transmitted to all facilities within larger geographic area on   Patient informed that his/her managed care company has contracts with or will negotiate with  certain facilities, including the following:     Patient/family informed of bed offers received:  11/24/2014 [facility] Physician recommends and patient chooses bed at    [facility]   Patient to be transferred to facility by  Patient and family notified of transfer on  Name of family member notified:    The following physician request  were entered in Epic:   Additional Comments:    Lincoln MaxinKelly Mahad Newstrom, LCSW [facility] Clinical Social Worker cell #: [phone]

## 2014-11-25 LAB — CBC WITH DIFFERENTIAL/PLATELET
Basophils Absolute: 0 10*3/uL (ref 0.0–0.1)
Basophils Relative: 0 % (ref 0–1)
EOS PCT: 0 % (ref 0–5)
Eosinophils Absolute: 0 10*3/uL (ref 0.0–0.7)
HCT: 30.2 % — ABNORMAL LOW (ref 36.0–46.0)
Hemoglobin: 8.8 g/dL — ABNORMAL LOW (ref 12.0–15.0)
LYMPHS ABS: 2.3 10*3/uL (ref 0.7–4.0)
LYMPHS PCT: 17 % (ref 12–46)
MCH: 25.4 pg — ABNORMAL LOW (ref 26.0–34.0)
MCHC: 29.1 g/dL — ABNORMAL LOW (ref 30.0–36.0)
MCV: 87 fL (ref 78.0–100.0)
MONO ABS: 1.8 10*3/uL — AB (ref 0.1–1.0)
MONOS PCT: 13 % — AB (ref 3–12)
NEUTROS ABS: 9.4 10*3/uL — AB (ref 1.7–7.7)
NEUTROS PCT: 70 % (ref 43–77)
PLATELETS: 430 10*3/uL — AB (ref 150–400)
RBC: 3.47 MIL/uL — ABNORMAL LOW (ref 3.87–5.11)
RDW: 18.8 % — ABNORMAL HIGH (ref 11.5–15.5)
WBC: 13.5 10*3/uL — AB (ref 4.0–10.5)

## 2014-11-25 LAB — BASIC METABOLIC PANEL
ANION GAP: 9 (ref 5–15)
BUN: 25 mg/dL — ABNORMAL HIGH (ref 6–23)
CALCIUM: 8.5 mg/dL (ref 8.4–10.5)
CO2: 29 mmol/L (ref 19–32)
CREATININE: 1.15 mg/dL — AB (ref 0.50–1.10)
Chloride: 99 mmol/L (ref 96–112)
GFR calc Af Amer: 57 mL/min — ABNORMAL LOW (ref >90)
GFR calc non Af Amer: 50 mL/min — ABNORMAL LOW (ref >90)
GLUCOSE: 107 mg/dL — AB (ref 70–99)
Potassium: 3.8 mmol/L (ref 3.5–5.1)
SODIUM: 137 mmol/L (ref 135–145)

## 2014-11-25 MED ORDER — SODIUM CHLORIDE 0.9 % IJ SOLN: 3.0000 mL | Freq: Two times a day (BID) | INTRAMUSCULAR | Status: DC

## 2014-11-25 MED ORDER — POTASSIUM CHLORIDE ER 10 MEQ PO TBCR: 10.0000 meq | EXTENDED_RELEASE_TABLET | Freq: Every day | ORAL | Status: DC

## 2014-11-25 MED ORDER — METOPROLOL TARTRATE 50 MG PO TABS: 50.0000 mg | ORAL_TABLET | Freq: Two times a day (BID) | ORAL | Status: DC

## 2014-11-25 NOTE — Progress Notes (Signed)
Echocardiogram 2D Echocardiogram has been performed.  [name] 11/25/2014, 10:38 AM

## 2014-11-25 NOTE — Discharge Summary (Signed)
Physician Discharge Summary   Patient ID: [name] MRN: [id number] DOB/AGE: [id number] y.o.  Admit date: 11/20/2014 Discharge date:   Primary Diagnosis: right knee DJD  Admission Diagnoses:  Past Medical History  Diagnosis Date  . Hypertension   . Hyperlipidemia   . Obesity   . GERD (gastroesophageal reflux disease)     RARE- OTC ANTACID IF NEEDED--PAST HX OF ULCER  . 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  PT ALSO HAS LOWER BACK PAIN AT TIMES  . Anxiety   . Depression   . Kidney stones     PT PASSED STONES--NO KNOWN STONES AT PRESENT TIMES  . Urinary incontinence   . Ulcer   . Abnormality of gait 10/02/2013  . Lumbago 10/02/2013  . Obesity   . Periodic limb movement disorder (PLMD) 12/30/2013  . Dysrhythmia     PT STATES IRREGULAR HB FELT TO BE RELATED TO STRESS--NEGATIVE CARDIAC WORK UP IN 2011 WITH DR. Aundra Dubin [facility] --NUCLEAR STRESS TEST, ECHO AND HOLTER  MONITOR RESULTS IN EPIC   . Sleep apnea 02/10/2012  . OSA (obstructive sleep apnea) 12/30/2013    Very mild OSA with REM accentuation,  tested 11-29-13 at [facility] sleep, Dr Rexene Alberts .   Marland Kitchen Restless leg syndrome   . Shortness of breath dyspnea   . Headache     wakes up with headache    Discharge Diagnoses:   Principal Problem:   Right knee DJD  Estimated body mass index is 45.29 kg/(m^2) as calculated from the following:   Height as of this encounter: _0  (1.626 m).   Weight as of this encounter: 119.75 kg (264 lb).  Procedure:  Procedure(s) (LRB): RIGHT TOTAL KNEE ARTHROPLASTY (Right)   Consults: cardiology for new onset A.Fib  HPI: see H&P Laboratory Data: Admission on 11/20/2014  Component Date Value Ref Range Status  . WBC 11/21/2014 12.7* 4.0 - 10.5 K/uL Final  . RBC 11/21/2014 3.46* 3.87 - 5.11 MIL/uL Final  . Hemoglobin 11/21/2014 8.8* 12.0 - 15.0 g/dL Final  . HCT 11/21/2014 29.8* 36.0 - 46.0 % Final  . MCV 11/21/2014 86.1  78.0 - 100.0 fL Final  . MCH 11/21/2014  25.4* 26.0 - 34.0 pg Final  . MCHC 11/21/2014 29.5* 30.0 - 36.0 g/dL Final  . RDW 11/21/2014 18.8* 11.5 - 15.5 % Final  . Platelets 11/21/2014 326  150 - 400 K/uL Final  . Sodium 11/21/2014 141  135 - 145 mmol/L Final  . Potassium 11/21/2014 4.1  3.5 - 5.1 mmol/L Final  . Chloride 11/21/2014 107  96 - 112 mmol/L Final  . CO2 11/21/2014 25  19 - 32 mmol/L Final  . Glucose, Bld 11/21/2014 162* 70 - 99 mg/dL Final  . BUN 11/21/2014 12  6 - 23 mg/dL Final  . Creatinine, Ser 11/21/2014 1.00  0.50 - 1.10 mg/dL Final  . Calcium 11/21/2014 9.0  8.4 - 10.5 mg/dL Final  . GFR calc non Af Amer 11/21/2014 59* >90 mL/min Final  . GFR calc Af Amer 11/21/2014 68* >90 mL/min Final   Comment: (NOTE) The eGFR has been calculated using the CKD EPI equation. This calculation has not been validated in all clinical situations. eGFR's persistently <90 mL/min signify possible Chronic Kidney Disease.   . Anion gap 11/21/2014 9  5 - 15 Final  . WBC 11/22/2014 15.6* 4.0 - 10.5 K/uL Final  . RBC 11/22/2014 3.66* 3.87 - 5.11 MIL/uL Final  . Hemoglobin 11/22/2014 9.4*  12.0 - 15.0 g/dL Final  . HCT 11/22/2014 31.9* 36.0 - 46.0 % Final  . MCV 11/22/2014 87.2  78.0 - 100.0 fL Final  . MCH 11/22/2014 25.7* 26.0 - 34.0 pg Final  . MCHC 11/22/2014 29.5* 30.0 - 36.0 g/dL Final  . RDW 11/22/2014 19.1* 11.5 - 15.5 % Final  . Platelets 11/22/2014 367  150 - 400 K/uL Final  . WBC 11/23/2014 13.4* 4.0 - 10.5 K/uL Final  . RBC 11/23/2014 3.50* 3.87 - 5.11 MIL/uL Final  . Hemoglobin 11/23/2014 9.0* 12.0 - 15.0 g/dL Final  . HCT 11/23/2014 30.4* 36.0 - 46.0 % Final  . MCV 11/23/2014 86.9  78.0 - 100.0 fL Final  . MCH 11/23/2014 25.7* 26.0 - 34.0 pg Final  . MCHC 11/23/2014 29.6* 30.0 - 36.0 g/dL Final  . RDW 11/23/2014 19.1* 11.5 - 15.5 % Final  . Platelets 11/23/2014 360  150 - 400 K/uL Final  . WBC 11/24/2014 14.4* 4.0 - 10.5 K/uL Final   WHITE COUNT CONFIRMED ON SMEAR  . RBC 11/24/2014 3.28* 3.87 - 5.11 MIL/uL Final    . Hemoglobin 11/24/2014 8.4* 12.0 - 15.0 g/dL Final  . HCT 11/24/2014 28.4* 36.0 - 46.0 % Final  . MCV 11/24/2014 86.6  78.0 - 100.0 fL Final  . MCH 11/24/2014 25.6* 26.0 - 34.0 pg Final  . MCHC 11/24/2014 29.6* 30.0 - 36.0 g/dL Final  . RDW 11/24/2014 18.9* 11.5 - 15.5 % Final  . Platelets 11/24/2014 391  150 - 400 K/uL Final  . Neutrophils Relative % 11/24/2014 76  43 - 77 % Final  . Lymphocytes Relative 11/24/2014 13  12 - 46 % Final  . Monocytes Relative 11/24/2014 11  3 - 12 % Final  . Eosinophils Relative 11/24/2014 0  0 - 5 % Final  . Basophils Relative 11/24/2014 0  0 - 1 % Final  . Neutro Abs 11/24/2014 10.9* 1.7 - 7.7 K/uL Final  . Lymphs Abs 11/24/2014 1.9  0.7 - 4.0 K/uL Final  . Monocytes Absolute 11/24/2014 1.6* 0.1 - 1.0 K/uL Final  . Eosinophils Absolute 11/24/2014 0.0  0.0 - 0.7 K/uL Final  . Basophils Absolute 11/24/2014 0.0  0.0 - 0.1 K/uL Final  . Smear Review 11/24/2014 LARGE PLATELETS PRESENT   Final  . Sodium 11/24/2014 137  135 - 145 mmol/L Final  . Potassium 11/24/2014 3.3* 3.5 - 5.1 mmol/L Final  . Chloride 11/24/2014 99  96 - 112 mmol/L Final  . CO2 11/24/2014 29  19 - 32 mmol/L Final  . Glucose, Bld 11/24/2014 129* 70 - 99 mg/dL Final  . BUN 11/24/2014 24* 6 - 23 mg/dL Final  . Creatinine, Ser 11/24/2014 1.11* 0.50 - 1.10 mg/dL Final  . Calcium 11/24/2014 8.5  8.4 - 10.5 mg/dL Final  . GFR calc non Af Amer 11/24/2014 52* >90 mL/min Final  . GFR calc Af Amer 11/24/2014 60* >90 mL/min Final   Comment: (NOTE) The eGFR has been calculated using the CKD EPI equation. This calculation has not been validated in all clinical situations. eGFR's persistently <90 mL/min signify possible Chronic Kidney Disease.   . Anion gap 11/24/2014 9  5 - 15 Final  . TSH 11/24/2014 1.367  0.350 - 4.500 uIU/mL Final   Performed at [facility]  . Sodium 11/25/2014 137  135 - 145 mmol/L Final  . Potassium 11/25/2014 3.8  3.5 - 5.1 mmol/L Final  . Chloride 11/25/2014  99  96 - 112 mmol/L Final  . CO2 11/25/2014  29  19 - 32 mmol/L Final  . Glucose, Bld 11/25/2014 107* 70 - 99 mg/dL Final  . BUN 11/25/2014 25* 6 - 23 mg/dL Final  . Creatinine, Ser 11/25/2014 1.15* 0.50 - 1.10 mg/dL Final  . Calcium 11/25/2014 8.5  8.4 - 10.5 mg/dL Final  . GFR calc non Af Amer 11/25/2014 50* >90 mL/min Final  . GFR calc Af Amer 11/25/2014 57* >90 mL/min Final   Comment: (NOTE) The eGFR has been calculated using the CKD EPI equation. This calculation has not been validated in all clinical situations. eGFR's persistently <90 mL/min signify possible Chronic Kidney Disease.   . Anion gap 11/25/2014 9  5 - 15 Final  . WBC 11/25/2014 13.5* 4.0 - 10.5 K/uL Final  . RBC 11/25/2014 3.47* 3.87 - 5.11 MIL/uL Final  . Hemoglobin 11/25/2014 8.8* 12.0 - 15.0 g/dL Final  . HCT 11/25/2014 30.2* 36.0 - 46.0 % Final  . MCV 11/25/2014 87.0  78.0 - 100.0 fL Final  . MCH 11/25/2014 25.4* 26.0 - 34.0 pg Final  . MCHC 11/25/2014 29.1* 30.0 - 36.0 g/dL Final  . RDW 11/25/2014 18.8* 11.5 - 15.5 % Final  . Platelets 11/25/2014 430* 150 - 400 K/uL Final  . Neutrophils Relative % 11/25/2014 70  43 - 77 % Final  . Neutro Abs 11/25/2014 9.4* 1.7 - 7.7 K/uL Final  . Lymphocytes Relative 11/25/2014 17  12 - 46 % Final  . Lymphs Abs 11/25/2014 2.3  0.7 - 4.0 K/uL Final  . Monocytes Relative 11/25/2014 13* 3 - 12 % Final  . Monocytes Absolute 11/25/2014 1.8* 0.1 - 1.0 K/uL Final  . Eosinophils Relative 11/25/2014 0  0 - 5 % Final  . Eosinophils Absolute 11/25/2014 0.0  0.0 - 0.7 K/uL Final  . Basophils Relative 11/25/2014 0  0 - 1 % Final  . Basophils Absolute 11/25/2014 0.0  0.0 - 0.1 K/uL Final  Hospital Outpatient Visit on 11/13/2014  Component Date Value Ref Range Status  . Sodium 11/13/2014 142  135 - 145 mmol/L Final  . Potassium 11/13/2014 3.6  3.5 - 5.1 mmol/L Final  . Chloride 11/13/2014 110  96 - 112 mmol/L Final  . CO2 11/13/2014 23  19 - 32 mmol/L Final  . Glucose, Bld 11/13/2014  92  70 - 99 mg/dL Final  . BUN 11/13/2014 18  6 - 23 mg/dL Final  . Creatinine, Ser 11/13/2014 1.05  0.50 - 1.10 mg/dL Final  . Calcium 11/13/2014 8.7  8.4 - 10.5 mg/dL Final  . GFR calc non Af Amer 11/13/2014 55* >90 mL/min Final  . GFR calc Af Amer 11/13/2014 64* >90 mL/min Final   Comment: (NOTE) The eGFR has been calculated using the CKD EPI equation. This calculation has not been validated in all clinical situations. eGFR's persistently <90 mL/min signify possible Chronic Kidney Disease.   . Anion gap 11/13/2014 9  5 - 15 Final  . WBC 11/13/2014 8.3  4.0 - 10.5 K/uL Final  . RBC 11/13/2014 4.22  3.87 - 5.11 MIL/uL Final  . Hemoglobin 11/13/2014 10.9* 12.0 - 15.0 g/dL Final  . HCT 11/13/2014 36.8  36.0 - 46.0 % Final  . MCV 11/13/2014 87.2  78.0 - 100.0 fL Final  . MCH 11/13/2014 25.8* 26.0 - 34.0 pg Final  . MCHC 11/13/2014 29.6* 30.0 - 36.0 g/dL Final  . RDW 11/13/2014 19.4* 11.5 - 15.5 % Final  . Platelets 11/13/2014 332  150 - 400 K/uL Final  . Prothrombin Time 11/13/2014 12.8  11.6 - 15.2 seconds Final  . INR 11/13/2014 0.95  0.00 - 1.49 Final  . ABO/RH(D) 11/13/2014 O NEG   Final  . Antibody Screen 11/13/2014 NEG   Final  . Sample Expiration 11/13/2014 11/23/2014   Final  . Color, Urine 11/13/2014 YELLOW  YELLOW Final  . APPearance 11/13/2014 CLEAR  CLEAR Final  . Specific Gravity, Urine 11/13/2014 1.022  1.005 - 1.030 Final  . pH 11/13/2014 5.5  5.0 - 8.0 Final  . Glucose, UA 11/13/2014 NEGATIVE  NEGATIVE mg/dL Final  . Hgb urine dipstick 11/13/2014 NEGATIVE  NEGATIVE Final  . Bilirubin Urine 11/13/2014 NEGATIVE  NEGATIVE Final  . Ketones, ur 11/13/2014 NEGATIVE  NEGATIVE mg/dL Final  . Protein, ur 11/13/2014 NEGATIVE  NEGATIVE mg/dL Final  . Urobilinogen, UA 11/13/2014 0.2  0.0 - 1.0 mg/dL Final  . Nitrite 11/13/2014 NEGATIVE  NEGATIVE Final  . Leukocytes, UA 11/13/2014 SMALL* NEGATIVE Final  . aPTT 11/13/2014 32  24 - 37 seconds Final  . MRSA, PCR 11/13/2014  NEGATIVE  NEGATIVE Final  . Staphylococcus aureus 11/13/2014 NEGATIVE  NEGATIVE Final   Comment:        The Xpert SA Assay (FDA approved for NASAL specimens in patients over 51 years of age), is one component of a comprehensive surveillance program.  Test performance has been validated by [facility] for patients greater than or equal to 53 year old. It is not intended to diagnose infection nor to guide or monitor treatment.   . Squamous Epithelial / LPF 11/13/2014 RARE  RARE Final  . WBC, UA 11/13/2014 7-10  <3 WBC/hpf Final  . RBC / HPF 11/13/2014 0-2  <3 RBC/hpf Final  . Bacteria, UA 11/13/2014 RARE  RARE Final  . Edwina Barth 11/13/2014 MUCOUS PRESENT   Final  Office Visit on 10/19/2014  Component Date Value Ref Range Status  . WBC, Ur, HPF, POC 10/19/2014 8-12   Final  . RBC, urine, microscopic 10/19/2014 0-2   Final  . Bacteria, U Microscopic 10/19/2014 small   Final  . Mucus, UA 10/19/2014 neg   Final  . Epithelial cells, urine per micros 10/19/2014 3-5   Final  . Crystals, Ur, HPF, POC 10/19/2014 neg   Final  . Casts, Ur, LPF, POC 10/19/2014 neg   Final  . Yeast, UA 10/19/2014 neg   Final  . Color, UA 10/19/2014 yellow   Final  . Clarity, UA 10/19/2014 clear   Final  . Glucose, UA 10/19/2014 neg   Final  . Bilirubin, UA 10/19/2014 neg   Final  . Ketones, UA 10/19/2014 neg   Final  . Spec Grav, UA 10/19/2014 1.025   Final  . Blood, UA 10/19/2014 neg   Final  . pH, UA 10/19/2014 5.5   Final  . Protein, UA 10/19/2014 neg   Final  . Urobilinogen, UA 10/19/2014 0.2   Final  . Nitrite, UA 10/19/2014 neg   Final  . Leukocytes, UA 10/19/2014 Trace   Final  . Colony Count 10/19/2014 30,000 COLONIES/ML   Final  . Organism ID, Bacteria 10/19/2014 Multiple bacterial morphotypes present, none   Final  . Organism ID, Bacteria 10/19/2014 predominant. Suggest appropriate recollection if    Final  . Organism ID, Bacteria 10/19/2014 clinically indicated.   Final     X-Rays:Dg  Knee 1-2 Views Right  11/13/2014   CLINICAL DATA:  Preop for right total knee replacement  EXAM: RIGHT KNEE - 1-2 VIEW  COMPARISON:  None.  FINDINGS: There is tricompartmental degenerative joint  disease of the knee with primary involvement being that of the medial compartment. Medially there is more loss of joint space, sclerosis, and spurring present with some loss of patella femoral articulation as well. Mild spurring is noted from the lateral compartment. No fracture is seen. No definite joint effusion is noted.  IMPRESSION: Tricompartmental degenerative joint disease of the right knee with primary involvement of the medial compartment.   Electronically Signed   By: Ivar Drape M.D.   On: 11/13/2014 10:11   Dg Knee Right Port  11/20/2014   CLINICAL DATA:  Status post right knee replacement  EXAM: PORTABLE RIGHT KNEE - 1-2 VIEW  COMPARISON:  11/13/2014  FINDINGS: Changes consistent with right knee replacement are noted. No acute bony abnormality is seen. A surgical drain is noted in place.  IMPRESSION: Status post right knee replacement   Electronically Signed   By: Inez Catalina M.D.   On: 11/20/2014 14:18    EKG: Orders placed or performed during the hospital encounter of 11/20/14  . EKG 12-Lead  . EKG 12-Lead  . EKG 12-Lead  . EKG 12-Lead  . EKG 12-Lead  . EKG 12-Lead     Hospital Course: NEOMIA HERBEL is a 64 y.o. who was admitted to [facility]. They were brought to the operating room on 11/20/2014 and underwent Procedure(s): RIGHT TOTAL KNEE ARTHROPLASTY.  Patient tolerated the procedure well and was later transferred to the recovery room and then to the orthopaedic floor for postoperative care.  They were given PO and IV analgesics for pain control following their surgery.  They were given 24 hours of postoperative antibiotics of  Anti-infectives    Start     Dose/Rate Route Frequency Ordered Stop   11/20/14 1800  ceFAZolin (ANCEF) IVPB 2 g/50 mL premix     2 g 100 mL/hr  over 30 Minutes Intravenous Every 6 hours 11/20/14 1535 11/21/14 0726   11/20/14 1102  polymyxin B 500,000 Units, bacitracin 50,000 Units in sodium chloride irrigation 0.9 % 500 mL irrigation  Status:  Discontinued       As needed 11/20/14 1103 11/20/14 1257   11/20/14 1015  clindamycin (CLEOCIN) IVPB 900 mg     900 mg 100 mL/hr over 30 Minutes Intravenous  Once 11/20/14 1005 11/20/14 1040   11/20/14 1000  ceFAZolin (ANCEF) 3 g in dextrose 5 % 50 mL IVPB     3 g 160 mL/hr over 30 Minutes Intravenous  Once 11/20/14 0948 11/20/14 1022   11/20/14 0830  clindamycin (CLEOCIN) IVPB 900 mg  Status:  Discontinued     900 mg 100 mL/hr over 30 Minutes Intravenous On call to O.R. 11/20/14 0823 11/20/14 1000   11/20/14 0830  ceFAZolin (ANCEF) IVPB 2 g/50 mL premix  Status:  Discontinued     2 g 100 mL/hr over 30 Minutes Intravenous On call to O.R. 11/20/14 5364 11/20/14 0948     and started on DVT prophylaxis in the form of Xarelto, SCD's and TED hose.   PT and OT were ordered for total joint protocol.  Discharge planning consulted to help with postop disposition and equipment needs.  Patient had a fair night on the evening of surgery.  They started to get up OOB with therapy on day one. Hemovac drain was pulled without difficulty.  Continued to work with therapy into day two. Cardiology consulted for new onset A-Fib on post-op day 3 (Dr. Doylene Canard) and pt was started on digoxin, rate improved by POD#5. Echo  obtained. By day five, the patient had progressed with therapy and meeting their goals.  Incision was healing well.  Patient was seen in rounds daily and was ready to go to SNF on POD#5 after cleared by cardiology for D/C.  Diet: Diabetic diet Activity:WBAT Follow-up:in 10-14 days Disposition - SNF- [name] Discharged Condition: good   Discharge Instructions    Call MD / Call 911    Complete by:  As directed   If you experience chest pain or shortness of breath, CALL 911 and be transported to  the hospital emergency room.  If you develope a fever above 101 F, pus (white drainage) or increased drainage or redness at the wound, or calf pain, call your surgeon's office.     Constipation Prevention    Complete by:  As directed   Drink plenty of fluids.  Prune juice may be helpful.  You may use a stool softener, such as Colace (over the counter) 100 mg twice a day.  Use MiraLax (over the counter) for constipation as needed.     Diet - low sodium heart healthy    Complete by:  As directed      Increase activity slowly as tolerated    Complete by:  As directed             Medication List    STOP taking these medications        BC HEADACHE POWDER PO     chlorthalidone 25 MG tablet  Commonly known as:  HYGROTON     ciprofloxacin 500 MG tablet  Commonly known as:  CIPRO     doxycycline 100 MG capsule  Commonly known as:  VIBRAMYCIN     lisinopril 20 MG tablet  Commonly known as:  PRINIVIL,ZESTRIL     meloxicam 15 MG tablet  Commonly known as:  MOBIC     multivitamin with minerals Tabs tablet     phenazopyridine 95 MG tablet  Commonly known as:  PYRIDIUM     TURMERIC PO     verapamil 120 MG 24 hr capsule  Commonly known as:  VERELAN PM      TAKE these medications        buPROPion 150 MG 24 hr tablet  Commonly known as:  WELLBUTRIN XL  Take 150 mg by mouth every morning.     citalopram 20 MG tablet  Commonly known as:  CELEXA  Take 20 mg by mouth at bedtime.     cyanocobalamin 1000 MCG/ML injection  Commonly known as:  (VITAMIN B-12)  Inject 1,000 mcg into the muscle every 30 (thirty) days.     docusate sodium 100 MG capsule  Commonly known as:  COLACE  Take 1 capsule (100 mg total) by mouth 2 (two) times daily as needed for mild constipation.     esomeprazole 20 MG capsule  Commonly known as:  NEXIUM  Take 20 mg by mouth daily at 12 noon.     gabapentin 600 MG tablet  Commonly known as:  NEURONTIN  Take 600 mg by mouth 4 (four) times daily.      iron polysaccharides 150 MG capsule  Commonly known as:  NIFEREX  Take 150 mg by mouth daily.     metoprolol 50 MG tablet  Commonly known as:  LOPRESSOR  Take 1 tablet (50 mg total) by mouth 2 (two) times daily.     oxyCODONE 5 MG immediate release tablet  Commonly known as:  Oxy IR/ROXICODONE  Take 1-2 tablets (  5-10 mg total) by mouth every 4 (four) hours as needed for severe pain.     potassium chloride 10 MEQ tablet  Commonly known as:  K-DUR  Take 1 tablet (10 mEq total) by mouth daily.     rivaroxaban 10 MG Tabs tablet  Commonly known as:  XARELTO  Take 1 tablet (10 mg total) by mouth daily.     rOPINIRole 0.25 MG tablet  Commonly known as:  REQUIP  TAKE ONE TABLET BY MOUTH AT BEDTIME     SAPHRIS 10 MG Subl  Generic drug:  Asenapine Maleate  Place 10 mg under the tongue at bedtime.     Vitamin D (Ergocalciferol) 50000 UNITS Caps capsule  Commonly known as:  DRISDOL  Take 50,000 Units by mouth every 30 (thirty) days.           Follow-up Information    Follow up with BEANE,JEFFREY C, MD In 2 weeks.   Specialty:  Orthopedic Surgery   Why:  For suture removal   Contact information:   [address] [place] [address] [phone]       Follow up with Donnajean Lopes, MD In 3 weeks.   Specialty:  Internal Medicine   Contact information:   [address] [place] [address] [phone]       Follow up with [facility], MD. Schedule an appointment as soon as possible for a visit in 2 weeks.   Specialty:  Cardiology   Contact information:   [place] [address] [phone]       Signed: Lacie Draft, PA-C Orthopaedic Surgery 11/25/2014, 4:24 PM

## 2014-11-25 NOTE — Progress Notes (Signed)
[facility]. Patient & friend, Gavin PoundDeborah aware. Discharge packet given to RN, Darl PikesSusan. [facility] called for transport.   Clinical Social Work Department CLINICAL SOCIAL WORK PLACEMENT NOTE 11/25/2014  Patient:  Yvette ShutterSUTPHEN,Wanette I  Account Number:  [id number] Admit date:  11/20/2014  Clinical Social Worker:  Cori RazorJAMIE HAIDINGER, LCSW  Date/time:  11/22/2014 04:00 PM  Clinical Social Work is seeking post-discharge placement for this patient at the following level of care:   SKILLED NURSING   (*CSW will update this form in Epic as items are completed)   11/22/2014  Patient/family provided with [facility] Department of Clinical Social Work's list of facilities offering this level of care within the geographic area requested by the patient (or if unable, by the patient's family).  11/22/2014  Patient/family informed of their freedom to choose among providers that offer the needed level of care, that participate in Medicare, Medicaid or managed care program needed by the patient, have an available bed and are willing to accept the patient.  11/24/2014  Patient/family informed of [facility]' ownership interest in [facility], as well as of the fact that they are under no obligation to receive care at this facility.  PASARR submitted to EDS on 11/22/2014 PASARR number received on 11/24/2014  FL2 transmitted to all facilities in geographic area requested by pt/family on  11/22/2014 FL2 transmitted to all facilities within larger geographic area on   Patient informed that his/her managed care company has contracts with or will negotiate with  certain facilities, including the following:     Patient/family informed of bed offers received:  11/24/2014 [facility] Physician recommends and patient chooses bed at    [facility] on  11/25/2014 Patient to be transferred to facility by [facility] Patient and  family notified of transfer on 11/25/2014 Name of family member notified:  patient's friend, Information systems managerDeborah via phone  The following physician request were entered in Epic:   Additional Comments:   Lincoln MaxinKelly Sloane Junkin, LCSW [facility] Clinical Social Worker cell #: [phone]

## 2014-11-25 NOTE — Progress Notes (Signed)
Report called to [facility] and talked with Sherrie Rn.

## 2014-11-25 NOTE — Consult Note (Signed)
Ref: Yvette FillersPATERSON,DANIEL G, MD   Subjective:  Good LV systolic function with mild MR and TR. Afebrile.  Objective:  Vital Signs in the last 24 hours: Temp:  [97.6 F (36.4 C)-98.6 F (37 C)] 98.6 F (37 C) (03/01 1453) Pulse Rate:  [69-101] 69 (03/01 1453) Cardiac Rhythm:  [-] Atrial fibrillation (03/01 0744) Resp:  [18-20] 20 (03/01 1453) BP: (95-114)/(59-70) 95/59 mmHg (03/01 1453) SpO2:  [92 %-97 %] 96 % (03/01 1453)  Physical Exam: BP Readings from Last 1 Encounters:  11/25/14 95/59    Wt Readings from Last 1 Encounters:  11/20/14 119.75 kg (264 lb)    Weight change:   HEENT: [place]/AT, Eyes-Brown, PERL, EOMI, Conjunctiva-Pink, Sclera-Non-icteric Neck: No JVD, No bruit, Trachea midline. Lungs:  Clear, Bilateral. Cardiac:  Regular rhythm, normal S1 and S2, no S3.  Abdomen:  Soft, non-tender. Extremities:  No edema present. No cyanosis. No clubbing. CNS: AxOx3, Cranial nerves grossly intact, moves all 4 extremities. Right handed. Skin: Warm and dry.   Intake/Output from previous day: 02/29 0701 - 03/01 0700 In: 960 [P.O.:960] Out: -     Lab Results: BMET    Component Value Date/Time   NA 137 11/25/2014 0555   NA 137 11/24/2014 0615   NA 141 11/21/2014 0453   K 3.8 11/25/2014 0555   K 3.3* 11/24/2014 0615   K 4.1 11/21/2014 0453   CL 99 11/25/2014 0555   CL 99 11/24/2014 0615   CL 107 11/21/2014 0453   CO2 29 11/25/2014 0555   CO2 29 11/24/2014 0615   CO2 25 11/21/2014 0453   GLUCOSE 107* 11/25/2014 0555   GLUCOSE 129* 11/24/2014 0615   GLUCOSE 162* 11/21/2014 0453   BUN 25* 11/25/2014 0555   BUN 24* 11/24/2014 0615   BUN 12 11/21/2014 0453   CREATININE 1.15* 11/25/2014 0555   CREATININE 1.11* 11/24/2014 0615   CREATININE 1.00 11/21/2014 0453   CALCIUM 8.5 11/25/2014 0555   CALCIUM 8.5 11/24/2014 0615   CALCIUM 9.0 11/21/2014 0453   GFRNONAA 50* 11/25/2014 0555   GFRNONAA 52* 11/24/2014 0615   GFRNONAA 59* 11/21/2014 0453   GFRAA 57* 11/25/2014 0555   GFRAA 60* 11/24/2014 0615   GFRAA 68* 11/21/2014 0453   CBC    Component Value Date/Time   WBC 13.5* 11/25/2014 0555   RBC 3.47* 11/25/2014 0555   HGB 8.8* 11/25/2014 0555   HCT 30.2* 11/25/2014 0555   PLT 430* 11/25/2014 0555   MCV 87.0 11/25/2014 0555   MCH 25.4* 11/25/2014 0555   MCHC 29.1* 11/25/2014 0555   RDW 18.8* 11/25/2014 0555   LYMPHSABS 2.3 11/25/2014 0555   MONOABS 1.8* 11/25/2014 0555   EOSABS 0.0 11/25/2014 0555   BASOSABS 0.0 11/25/2014 0555   HEPATIC Function Panel No results for input(s): PROT in the last 8760 hours.  Invalid input(s):  ALBUMIN,  AST,  ALT,  ALKPHOS,  BILIDIR,  IBILI HEMOGLOBIN A1C No components found for: HGA1C,  MPG CARDIAC ENZYMES Lab Results  Component Value Date   CKTOTAL 106 02/16/2012   CKMB 1.5 02/16/2012   TROPONINI <0.30 02/16/2012   BNP No results for input(s): PROBNP in the last 8760 hours. TSH  Recent Labs  11/24/14 0615  TSH 1.367   CHOLESTEROL No results for input(s): CHOL in the last 8760 hours.  Scheduled Meds: . acidophilus  1 capsule Oral Daily  . asenapine  10 mg Sublingual QHS  . buPROPion  150 mg Oral q morning - 10a  . citalopram  20 mg  Oral QHS  . digoxin  0.25 mg Oral Daily  . docusate sodium  100 mg Oral BID  . gabapentin  600 mg Oral QID  . iron polysaccharides  150 mg Oral Daily  . metoprolol tartrate  50 mg Oral BID  . multivitamin with minerals  1 tablet Oral Daily  . pantoprazole  40 mg Oral Daily  . potassium chloride  10 mEq Oral TID  . rivaroxaban  10 mg Oral Q breakfast  . rOPINIRole  0.25 mg Oral QHS  . sodium chloride  3 mL Intravenous Q12H  . verapamil  120 mg Oral BID   Continuous Infusions:  PRN Meds:.acetaminophen **OR** acetaminophen, alum & mag hydroxide-simeth, bisacodyl, diphenhydrAMINE, HYDROmorphone (DILAUDID) injection, menthol-cetylpyridinium **OR** phenol, methocarbamol **OR** methocarbamol (ROBAXIN)  IV, metoCLOPramide **OR** metoCLOPramide (REGLAN) injection,  ondansetron **OR** ondansetron (ZOFRAN) IV, oxyCODONE, senna-docusate  Assessment/Plan: Atrial fibrillation with rapid ventricular response, new onset. S/P Right total knee arthroplasty Hypertension Obesity Anxiety and depression Fever Hypokalemia-resolved  May discharge to SNF. F/U office in 2 weeks.     LOS: 5 days    [name]  MD  11/25/2014, 3:56 PM

## 2014-11-25 NOTE — Progress Notes (Signed)
Physical Therapy Treatment Patient Details Name: [name] MRN: [id number] DOB: 01/07/1951 Today's Date: 11/25/2014    History of Present Illness Pt is a 64 year old female s/p R TKA with post operative new onset afib    PT Comments    Pt reporting posterior R leg pain she believes is starting from her back, and therefore declined mobility.  Attempted to discuss importance of OOB activity especially to relieve back however pt continued to decline.  Pt agreeable to exercises however after a couple reported pain not tolerable to continue, so reapplied ice pack to back per pt request.  Pt hopeful to d/c to SNF today.   Follow Up Recommendations  SNF     Equipment Recommendations  None recommended by PT    Recommendations for Other Services       Precautions / Restrictions Precautions Precautions: Fall;Knee Required Braces or Orthoses: Knee Immobilizer - Right Knee Immobilizer - Right: Discontinue once straight leg raise with < 10 degree lag Restrictions Other Position/Activity Restrictions: WBAT    Mobility  Bed Mobility                  Transfers                    Ambulation/Gait                 Stairs            Wheelchair Mobility    Modified Rankin (Stroke Patients Only)       Balance                                    Cognition Arousal/Alertness: Awake/alert Behavior During Therapy: WFL for tasks assessed/performed Overall Cognitive Status: Within Functional Limits for tasks assessed                      Exercises Total Joint Exercises Ankle Circles/Pumps: Both;Supine;AROM;20 reps Quad Sets: AROM;Both;Supine;15 reps    General Comments        Pertinent Vitals/Pain Pain Assessment: 0-10 Pain Score: 7  Pain Location: posterior R leg Pain Descriptors / Indicators: Aching;Shooting Pain Intervention(s): Limited activity within patient's tolerance;Monitored during session;Ice applied     Home Living                      Prior Function            PT Goals (current goals can now be found in the care plan section) Progress towards PT goals: Not progressing toward goals - comment (pt reports limitations due to pain)    Frequency  7X/week    PT Plan Current plan remains appropriate    Co-evaluation             End of Session   Activity Tolerance: Patient limited by pain;Patient limited by fatigue Patient left: in bed;with call bell/phone within reach     Time: 1044-1100 PT Time Calculation (min) (ACUTE ONLY): 16 min  Charges:  $Therapeutic Exercise: 8-22 mins                    G Codes:      [name],[name] 11/25/2014, 11:13 AM [name], PT, DPT 11/25/2014 Pager: [phone]

## 2014-11-25 NOTE — Progress Notes (Addendum)
Subjective: 5 Days Post-Op Procedure(s) (LRB): RIGHT TOTAL KNEE ARTHROPLASTY (Right) Patient reports pain as moderate, sore after doing PT exercises. She can tell her HR has finally slowed (83 this AM), is otherwise feeling well. Echo was not done yesterday. Feels she may be able to go today.   Objective: Vital signs in last 24 hours: Temp:  [97.6 F (36.4 C)-98.7 F (37.1 C)] 98 F (36.7 C) (03/01 0450) Pulse Rate:  [76-107] 83 (03/01 0450) Resp:  [18-20] 18 (03/01 0450) BP: (88-118)/(56-72) 114/70 mmHg (03/01 0450) SpO2:  [92 %-99 %] 92 % (03/01 0450)  Intake/Output from previous day: 02/29 0701 - 03/01 0700 In: 960 [P.O.:960] Out: -  Intake/Output this shift:     Recent Labs  11/23/14 0527 11/24/14 0615 11/25/14 0555  HGB 9.0* 8.4* 8.8*    Recent Labs  11/24/14 0615 11/25/14 0555  WBC 14.4* 13.5*  RBC 3.28* 3.47*  HCT 28.4* 30.2*  PLT 391 430*    Recent Labs  11/24/14 0615 11/25/14 0555  NA 137 137  K 3.3* 3.8  CL 99 99  CO2 29 29  BUN 24* 25*  CREATININE 1.11* 1.15*  GLUCOSE 129* 107*  CALCIUM 8.5 8.5   No results for input(s): LABPT, INR in the last 72 hours.  Neurologically intact ABD soft Neurovascular intact Sensation intact distally Intact pulses distally Dorsiflexion/Plantar flexion intact Incision: dressing C/D/I and scant drainage No cellulitis present Compartment soft no sign of DVT  Assessment/Plan: 5 Days Post-Op Procedure(s) (LRB): RIGHT TOTAL KNEE ARTHROPLASTY (Right) Advance diet Up with therapy  Hgb trending up today at 8.8 this morning New onset A.Fib, rate now controlled at 5283 with digoxin Awaiting echo per cardiology, appreciate input Anticipate she would be able to D/C today to SNF [name] if stable per cardiology, after echo obtained She will need scripts for D/C from cardiology for digoxin and any other new/updated cardiac meds and recommendations on chlorthalidone and lisinopril for D/C Will discuss with  Dr. Shelle IronBeane Follow up in office in approx 10 days for staple removal and xrays  Natina Wiginton M. 11/25/2014, 7:35 AM

## 2014-11-26 ENCOUNTER — Non-Acute Institutional Stay (SKILLED_NURSING_FACILITY): Payer: Medicare Other | Admitting: Internal Medicine

## 2014-11-26 DIAGNOSIS — K219 Gastro-esophageal reflux disease without esophagitis: Secondary | ICD-10-CM

## 2014-11-26 DIAGNOSIS — M545 Low back pain: Secondary | ICD-10-CM | POA: Diagnosis not present

## 2014-11-26 DIAGNOSIS — K5901 Slow transit constipation: Secondary | ICD-10-CM

## 2014-11-26 DIAGNOSIS — F329 Major depressive disorder, single episode, unspecified: Secondary | ICD-10-CM

## 2014-11-26 DIAGNOSIS — E876 Hypokalemia: Secondary | ICD-10-CM | POA: Diagnosis not present

## 2014-11-26 DIAGNOSIS — D72829 Elevated white blood cell count, unspecified: Secondary | ICD-10-CM | POA: Diagnosis not present

## 2014-11-26 DIAGNOSIS — M1711 Unilateral primary osteoarthritis, right knee: Secondary | ICD-10-CM

## 2014-11-26 DIAGNOSIS — D62 Acute posthemorrhagic anemia: Secondary | ICD-10-CM | POA: Diagnosis not present

## 2014-11-26 DIAGNOSIS — G2581 Restless legs syndrome: Secondary | ICD-10-CM | POA: Diagnosis not present

## 2014-11-26 DIAGNOSIS — F32A Depression, unspecified: Secondary | ICD-10-CM

## 2014-11-26 NOTE — Progress Notes (Signed)
Patient ID: [name], female   DOB: [birth year], 64 y.o.   MRN: [id number]     [name]   PCP: Garlan FillersPATERSON,DANIEL G, MD  Code Status: full code  Allergies  Allergen Reactions  . Zolpidem Tartrate Other (See Comments)    hallucinations  . Amoxicillin Rash    Chief Complaint  Patient presents with  . New Admit To SNF     HPI:  64 year old patient is here for short term rehabilitation post hospital admission from 11/20/14-11/25/14 with right knee OA. She underwent right total knee arthroplasty She has pmh of sleep apnea, gerd, DM, anxiety among others She is seen in her room today. Her pain is under control. She had bowel movement today.  Review of Systems:  Constitutional: Negative for fever, chills, diaphoresis.  HENT: Negative for headache, congestion Eyes: Negative for eye pain, blurred vision, double vision and discharge.  Respiratory: Negative for cough, shortness of breath and wheezing.   Cardiovascular: Negative for chest pain, palpitations, leg swelling.  Gastrointestinal: Negative for heartburn, nausea, vomiting, abdominal pain Genitourinary: Negative for dysuria Musculoskeletal: Negative for back pain, falls Skin: Negative for itching, rash.  Neurological: Negative for dizziness Psychiatric/Behavioral: Negative for depression   Past Medical History  Diagnosis Date  . Hypertension   . Hyperlipidemia   . Obesity   . GERD (gastroesophageal reflux disease)     RARE- OTC ANTACID IF NEEDED--PAST HX OF ULCER  . 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  PT ALSO HAS LOWER BACK PAIN AT TIMES  . Anxiety   . Depression   . Kidney stones     PT PASSED STONES--NO KNOWN STONES AT PRESENT TIMES  . Urinary incontinence   . Ulcer   . Abnormality of gait 10/02/2013  . Lumbago 10/02/2013  . Obesity   . Periodic limb movement disorder (PLMD) 12/30/2013  . Dysrhythmia     PT STATES IRREGULAR HB FELT TO BE RELATED TO  STRESS--NEGATIVE CARDIAC WORK UP IN 2011 WITH DR. [facility] --NUCLEAR STRESS TEST, ECHO AND HOLTER  MONITOR RESULTS IN EPIC   . Sleep apnea 02/10/2012  . OSA (obstructive sleep apnea) 12/30/2013    Very mild OSA with REM accentuation,  [facility] 11[facility]29-13 [facility], Dr Frances FurbishAthar .   Marland Kitchen. Restless leg syndrome   . Shortness of breath dyspnea   . Headache     wakes up with headache    Past Surgical History  Procedure Laterality Date  . Colon surgery    . Surgery for bowel obstruction and removal of part of stomach  in 2000  2000  . Bilateral bunionectomy 1979    . Total knee arthroplasty  02/16/2012    Procedure: TOTAL KNEE ARTHROPLASTY;  Surgeon: Javier DockerJeffrey C Beane, MD;  Location: [facility];  Service: Orthopedics;  Laterality: Left;  . Breast surgery      biopsy   . Small intestine surgery    . Total knee arthroplasty Right 11/20/2014    Procedure: RIGHT TOTAL KNEE ARTHROPLASTY;  Surgeon: Javier DockerJeffrey C Beane, MD;  Location: [facility];  Service: Orthopedics;  Laterality: Right;   Social History:   reports that she quit smoking about 26 years ago. Her smoking use included Cigarettes. She has a 40 pack-year smoking history. She has never used smokeless tobacco. She reports that she does not drink alcohol or use illicit drugs.  Family History  Problem Relation Age of Onset  . Sleep  apnea Father   . Multiple sclerosis Sister   . Multiple sclerosis Sister     Medications: Patient's Medications  New Prescriptions   No medications on file  Previous Medications   ASENAPINE MALEATE (SAPHRIS) 10 MG SUBL    Place 10 mg under the tongue at bedtime.    BUPROPION (WELLBUTRIN XL) 150 MG 24 HR TABLET    Take 150 mg by mouth every morning.   CITALOPRAM (CELEXA) 20 MG TABLET    Take 20 mg by mouth at bedtime.    CYANOCOBALAMIN (,VITAMIN B-12,) 1000 MCG/ML INJECTION    Inject 1,000 mcg into the muscle every 30 (thirty) days.   DOCUSATE SODIUM (COLACE) 100 MG CAPSULE    Take 1 capsule (100 mg total) by  mouth 2 (two) times daily as needed for mild constipation.   ESOMEPRAZOLE (NEXIUM) 20 MG CAPSULE    Take 20 mg by mouth daily at 12 noon.   GABAPENTIN (NEURONTIN) 600 MG TABLET    Take 600 mg by mouth 4 (four) times daily.   IRON POLYSACCHARIDES (NIFEREX) 150 MG CAPSULE    Take 150 mg by mouth daily.   METOPROLOL (LOPRESSOR) 50 MG TABLET    Take 1 tablet (50 mg total) by mouth 2 (two) times daily.   OXYCODONE (OXY IR/ROXICODONE) 5 MG IMMEDIATE RELEASE TABLET    Take 1-2 tablets (5-10 mg total) by mouth every 4 (four) hours as needed for severe pain.   POTASSIUM CHLORIDE (K-DUR) 10 MEQ TABLET    Take 1 tablet (10 mEq total) by mouth daily.   RIVAROXABAN (XARELTO) 10 MG TABS TABLET    Take 1 tablet (10 mg total) by mouth daily.   ROPINIROLE (REQUIP) 0.25 MG TABLET    TAKE ONE TABLET BY MOUTH AT BEDTIME    VITAMIN D, ERGOCALCIFEROL, (DRISDOL) 50000 UNITS CAPS CAPSULE    Take 50,000 Units by mouth every 30 (thirty) days.  Modified Medications   No medications on file  Discontinued Medications   No medications on file     Physical Exam: Filed Vitals:   11/26/14 1529  BP: 117/64  Pulse: 72  Temp: 99.2 F (37.3 C)  Resp: 18  SpO2: 97%    General- elderly female, well built, in no acute distress Head- normocephalic, atraumatic Neck- no cervical lymphadenopathy Cardiovascular- normal s1,s2, no murmurs, palpable dorsalis pedis and radial pulses, trace leg edema Respiratory- bilateral clear to auscultation, no wheeze, no rhonchi, no crackles, no use of accessory muscles Abdomen- bowel sounds present, soft, non tender Musculoskeletal- able to move all 4 extremities, limited right knee range of motion, ted hose present Neurological- no focal deficit Skin- warm and dry, right knee has aquacel dressing Psychiatry- alert and oriented to person, place and time, normal mood and affect    Labs reviewed: Basic Metabolic Panel:  Recent Labs  16/10/96 0453 11/24/14 0615 11/25/14 0555  NA  141 137 137  K 4.1 3.3* 3.8  CL 107 99 99  CO2 GLUCOSE 162* 129* 107*  BUN 12 24* 25*  CREATININE 1.00 1.11* 1.15*  CALCIUM 9.0 8.5 8.5   CBC:  Recent Labs  11/23/14 0527 11/24/14 0615 11/25/14 0555  WBC 13.4* 14.4* 13.5*  NEUTROABS  --  10.9* 9.4*  HGB 9.0* 8.4* 8.8*  HCT 30.4* 28.4* 30.2*  MCV 86.9 86.6 87.0  PLT 360 391 430*    Assessment/Plan  Right knee OA S/p right total knee replacement. Will have patient work with PT/OT as tolerated to  regain strength and restore function.  Fall precautions are in place. She is to follow with orthopedics. Continue xarelto 10 mg daily for dvt prophylaxis, oxyIR 1-2 tab 5 mg q4h prn for pain.   Blood loss anemia Post op, Check h&h. Continue niferex 150 mg daily   Depression Continue wellbutrin xl 150 mg daily and celexa 20 mg daily  gerd On nexium 20 mg daily  Lumbago Continue Gabapentin 600 mg qid  Hypokalemia Continue kdur 10 meq daily  RLS continue requip 0.25 mg daily  HTN Controlled bp, continue Lopressor 50 mg bid  Constipation Continue Colace 100 mg po bid  Leukocytosis Afebrile, no signs of infection, recheck cbc with diff   Labs ordered: Cbc with diff, cmp 12/01/14   Goals of care: short term rehabilitation   Family/ staff Communication: reviewed care plan with patient and nursing supervisor    Oneal Grout, MD  [facility] [phone] (Monday-Friday 8 am - 5 pm) [phone] (afterhours)

## 2014-12-12 ENCOUNTER — Other Ambulatory Visit: Payer: Self-pay | Admitting: Neurology

## 2014-12-23 ENCOUNTER — Other Ambulatory Visit: Payer: Self-pay | Admitting: Neurology

## 2015-01-29 ENCOUNTER — Other Ambulatory Visit: Payer: Self-pay

## 2015-01-29 MED ORDER — ROPINIROLE HCL 0.25 MG PO TABS: 0.2500 mg | ORAL_TABLET | Freq: Every day | ORAL | Status: DC

## 2015-02-24 ENCOUNTER — Ambulatory Visit (INDEPENDENT_AMBULATORY_CARE_PROVIDER_SITE_OTHER): Payer: Medicare Other | Admitting: Internal Medicine

## 2015-02-24 VITALS — BP 130/80 | HR 62 | Temp 99.3°F | Resp 17 | Ht 65.0 in | Wt 249.8 lb

## 2015-02-24 DIAGNOSIS — R509 Fever, unspecified: Secondary | ICD-10-CM | POA: Diagnosis not present

## 2015-02-24 DIAGNOSIS — R350 Frequency of micturition: Secondary | ICD-10-CM | POA: Diagnosis not present

## 2015-02-24 DIAGNOSIS — N3001 Acute cystitis with hematuria: Secondary | ICD-10-CM

## 2015-02-24 DIAGNOSIS — R112 Nausea with vomiting, unspecified: Secondary | ICD-10-CM

## 2015-02-24 LAB — POCT UA - MICROSCOPIC ONLY
CRYSTALS, UR, HPF, POC: NEGATIVE
Mucus, UA: NEGATIVE
Yeast, UA: NEGATIVE

## 2015-02-24 LAB — POCT CBC
Granulocyte percent: 70.1 %G (ref 37–80)
HCT, POC: 35 % — AB (ref 37.7–47.9)
Hemoglobin: 9.3 g/dL — AB (ref 12.2–16.2)
LYMPH, POC: 2.9 (ref 0.6–3.4)
MCH: 19.2 pg — AB (ref 27–31.2)
MCHC: 26.7 g/dL — AB (ref 31.8–35.4)
MCV: 71.8 fL — AB (ref 80–97)
MID (CBC): 0.7 (ref 0–0.9)
MPV: 7.5 fL (ref 0–99.8)
PLATELET COUNT, POC: 541 10*3/uL — AB (ref 142–424)
POC Granulocyte: 8.3 — AB (ref 2–6.9)
POC LYMPH %: 24.2 % (ref 10–50)
POC MID %: 5.7 % (ref 0–12)
RBC: 4.88 M/uL (ref 4.04–5.48)
RDW, POC: 20.2 %
WBC: 11.9 10*3/uL — AB (ref 4.6–10.2)

## 2015-02-24 LAB — POCT URINALYSIS DIPSTICK
Blood, UA: NEGATIVE
Glucose, UA: NEGATIVE
LEUKOCYTES UA: NEGATIVE
Nitrite, UA: NEGATIVE
PROTEIN UA: 100
Spec Grav, UA: >=1.03
UROBILINOGEN UA: 0.2
pH, UA: 5.5

## 2015-02-24 MED ORDER — PHENAZOPYRIDINE HCL 200 MG PO TABS: 200.0000 mg | ORAL_TABLET | Freq: Three times a day (TID) | ORAL | Status: DC | PRN

## 2015-02-24 MED ORDER — CIPROFLOXACIN HCL 500 MG PO TABS: 500.0000 mg | ORAL_TABLET | Freq: Two times a day (BID) | ORAL | Status: DC

## 2015-02-24 NOTE — Patient Instructions (Signed)

## 2015-02-24 NOTE — Progress Notes (Signed)
Subjective:    Patient ID: [name], female    DOB: Feb 04, 1951, 64 y.o.   MRN: [id number]  HPI This note is scribed by Mila MerryJean Harrell, CMA for Dr. Robert Bellowhris Raequan Vanschaick M.D 64 year old female complains of pain with urination, abdominal pain right side greater than the left. Frequency of urination, nausea chills all during the last 24 hrs.   Review of Systems     Objective:   Physical Exam  Constitutional: She is oriented to person, place, and time. She appears well-nourished. No distress.  HENT:  Head: Normocephalic.  Nose: Nose normal.  Eyes: Conjunctivae and EOM are normal. Pupils are equal, round, and reactive to light. Scleral icterus is present.  Neck: Normal range of motion.  Cardiovascular: Normal rate, regular rhythm and normal heart sounds.   Pulmonary/Chest: Effort normal and breath sounds normal.  Abdominal: Soft. There is tenderness. There is no rebound and no guarding.  Musculoskeletal: Normal range of motion.  Neurological: She is alert and oriented to person, place, and time. She exhibits normal muscle tone. Coordination normal.  Psychiatric: She has a normal mood and affect.     Results for orders placed or performed in visit on 02/24/15  POCT UA - Microscopic Only  Result Value Ref Range   WBC, Ur, HPF, POC 1-2    RBC, urine, microscopic 0-1    Bacteria, U Microscopic 1+    Mucus, UA neg    Epithelial cells, urine per micros 3-6    Crystals, Ur, HPF, POC neg    Casts, Ur, LPF, POC hyaline    Yeast, UA neg   POCT urinalysis dipstick  Result Value Ref Range   Color, UA amber    Clarity, UA hazy    Glucose, UA neg    Bilirubin, UA small    Ketones, UA trace    Spec Grav, UA >=1.030    Blood, UA neg    pH, UA 5.5    Protein, UA 100    Urobilinogen, UA 0.2    Nitrite, UA neg    Leukocytes, UA Negative    Results for orders placed or performed in visit on 02/24/15  POCT UA - Microscopic Only  Result Value Ref Range   WBC, Ur, HPF, POC 1-2    RBC,  urine, microscopic 0-1    Bacteria, U Microscopic 1+    Mucus, UA neg    Epithelial cells, urine per micros 3-6    Crystals, Ur, HPF, POC neg    Casts, Ur, LPF, POC hyaline    Yeast, UA neg   POCT urinalysis dipstick  Result Value Ref Range   Color, UA amber    Clarity, UA hazy    Glucose, UA neg    Bilirubin, UA small    Ketones, UA trace    Spec Grav, UA >=1.030    Blood, UA neg    pH, UA 5.5    Protein, UA 100    Urobilinogen, UA 0.2    Nitrite, UA neg    Leukocytes, UA Negative   POCT CBC  Result Value Ref Range   WBC 11.9 (A) 4.6 - 10.2 K/uL   Lymph, poc 2.9 0.6 - 3.4   POC LYMPH PERCENT 24.2 10 - 50 %L   MID (cbc) 0.7 0 - 0.9   POC MID % 5.7 0 - 12 %M   POC Granulocyte 8.3 (A) 2 - 6.9   Granulocyte percent 70.1 37 - 80 %G   RBC 4.88  4.04 - 5.48 M/uL   Hemoglobin 9.3 (A) 12.2 - 16.2 g/dL   HCT, POC 16.1 (A) 09.6 - 47.9 %   MCV 71.8 (A) 80 - 97 fL   MCH, POC 19.2 (A) 27 - 31.2 pg   MCHC 26.7 (A) 31.8 - 35.4 g/dL   RDW, POC 04.5 %   Platelet Count, POC 541 (A) 142 - 424 K/uL   MPV 7.5 0 - 99.8 fL         Assessment & Plan:  UTI Cipro/pyridium

## 2015-02-26 LAB — URINE CULTURE

## 2015-03-02 ENCOUNTER — Telehealth: Payer: Self-pay

## 2015-03-02 NOTE — Telephone Encounter (Signed)
Patient called wanting our office to know she passed a kidney stone recently. She stated she is feeling great and is still taking her antibiotics. Her call back number just in case is [phone]

## 2015-03-03 NOTE — Telephone Encounter (Signed)
fyi dr guest.

## 2015-03-04 ENCOUNTER — Other Ambulatory Visit: Payer: Self-pay | Admitting: Neurology

## 2015-04-21 ENCOUNTER — Ambulatory Visit (INDEPENDENT_AMBULATORY_CARE_PROVIDER_SITE_OTHER): Payer: Medicare Other | Admitting: Emergency Medicine

## 2015-04-21 VITALS — BP 122/80 | HR 73 | Temp 97.6°F | Resp 16 | Ht 65.0 in | Wt 253.0 lb

## 2015-04-21 DIAGNOSIS — M25571 Pain in right ankle and joints of right foot: Secondary | ICD-10-CM | POA: Diagnosis not present

## 2015-04-21 DIAGNOSIS — R350 Frequency of micturition: Secondary | ICD-10-CM

## 2015-04-21 DIAGNOSIS — R300 Dysuria: Secondary | ICD-10-CM

## 2015-04-21 LAB — POCT UA - MICROSCOPIC ONLY
CASTS, UR, LPF, POC: NEGATIVE
Mucus, UA: NEGATIVE
YEAST UA: NEGATIVE

## 2015-04-21 LAB — POCT CBC
Granulocyte percent: 74.4 %G (ref 37–80)
HCT, POC: 32.3 % — AB (ref 37.7–47.9)
Hemoglobin: 9.5 g/dL — AB (ref 12.2–16.2)
Lymph, poc: 1.9 (ref 0.6–3.4)
MCH, POC: 20 pg — AB (ref 27–31.2)
MCHC: 29.5 g/dL — AB (ref 31.8–35.4)
MCV: 67.7 fL — AB (ref 80–97)
MID (cbc): 0.7 (ref 0–0.9)
MPV: 7.3 fL (ref 0–99.8)
PLATELET COUNT, POC: 422 10*3/uL (ref 142–424)
POC Granulocyte: 7.5 — AB (ref 2–6.9)
POC LYMPH PERCENT: 18.6 %L (ref 10–50)
POC MID %: 7 %M (ref 0–12)
RBC: 4.78 M/uL (ref 4.04–5.48)
RDW, POC: 22.2 %
WBC: 10.1 10*3/uL (ref 4.6–10.2)

## 2015-04-21 LAB — COMPLETE METABOLIC PANEL WITH GFR
ALBUMIN: 4.1 g/dL (ref 3.6–5.1)
ALK PHOS: 114 U/L (ref 33–130)
ALT: 12 U/L (ref 6–29)
AST: 14 U/L (ref 10–35)
BUN: 14 mg/dL (ref 7–25)
CO2: 24 meq/L (ref 20–31)
CREATININE: 0.92 mg/dL (ref 0.50–0.99)
Calcium: 9.3 mg/dL (ref 8.6–10.4)
Chloride: 110 mEq/L (ref 98–110)
GFR, EST AFRICAN AMERICAN: 76 mL/min (ref >=60)
GFR, EST NON AFRICAN AMERICAN: 66 mL/min (ref >=60)
GLUCOSE: 65 mg/dL (ref 65–99)
Potassium: 4 mEq/L (ref 3.5–5.3)
Sodium: 143 mEq/L (ref 135–146)
TOTAL PROTEIN: 7 g/dL (ref 6.1–8.1)
Total Bilirubin: 0.3 mg/dL (ref 0.2–1.2)

## 2015-04-21 LAB — URIC ACID: URIC ACID, SERUM: 8.2 mg/dL — AB (ref 2.4–7.0)

## 2015-04-21 LAB — POCT URINALYSIS DIPSTICK
BILIRUBIN UA: NEGATIVE
GLUCOSE UA: NEGATIVE
KETONES UA: NEGATIVE
Leukocytes, UA: NEGATIVE
Nitrite, UA: NEGATIVE
Protein, UA: NEGATIVE
RBC UA: NEGATIVE
UROBILINOGEN UA: 0.2
pH, UA: 5.5

## 2015-04-21 MED ORDER — COLCHICINE 0.6 MG PO TABS: 0.6000 mg | ORAL_TABLET | Freq: Every day | ORAL | Status: DC

## 2015-04-21 MED ORDER — CIPROFLOXACIN HCL 500 MG PO TABS: 500.0000 mg | ORAL_TABLET | Freq: Two times a day (BID) | ORAL | Status: DC

## 2015-04-21 NOTE — Progress Notes (Signed)
04/21/2015 at 4:32 PM  [name] / DOB: [birth year] / MRN: [id number]  The patient has HYPERTENSION, UNSPECIFIED; Palpitations; DYSPNEA ON EXERTION; CHEST PAIN; Hypertension, uncontrolled; S/P total knee arthroplasty, left; Dizziness and giddiness; Abnormality of gait; Lumbago; Periodic limb movement disorder (PLMD); OSA (obstructive sleep apnea); and Right knee DJD on her problem list.  SUBJECTIVE  Chief complaint: Foot Pain and Cystitis  KENNAH Orozco is a 64 y.o. obese caucasian female here today for dysuria, suprapubic pain, and right sided diffuse foot pain.  Patient both symptoms started on 3 days ago.  She denies injury to her right foot and she has tried some left over opioid pain medication and Ibuprofen at OTC doses without relief.  She has a history of plantar fasciitis but reports this pain feels different.  She denies rash of the foot.     She  has a past medical history of Hypertension; Hyperlipidemia; Obesity; GERD (gastroesophageal reflux disease); Arthritis; Anxiety; Depression; Kidney stones; Urinary incontinence; Ulcer; Abnormality of gait (10/02/2013); Lumbago (10/02/2013); Obesity; Periodic limb movement disorder (PLMD) (12/30/2013); Dysrhythmia; Sleep apnea (02/10/2012); OSA (obstructive sleep apnea) (12/30/2013); Restless leg syndrome; Shortness of breath dyspnea; and Headache.    Medications reviewed and updated by myself where necessary, and exist elsewhere in the encounter.   Ms. Hoheisel is allergic to zolpidem tartrate; ambien; and amoxicillin. She  reports that she quit smoking about 26 years ago. Her smoking use included Cigarettes. She has a 40 pack-year smoking history. She has never used smokeless tobacco. She reports that she does not drink alcohol or use illicit drugs. She  has no sexual activity history on file. The patient  has past surgical history that includes Colon surgery; SURGERY FOR BOWEL OBSTRUCTION AND REMOVAL OF PART OF STOMACH  IN 2000 (2000);  BILATERAL BUNIONECTOMY 1979; Total knee arthroplasty (02/16/2012); Breast surgery; Small intestine surgery; and Total knee arthroplasty (Right, 11/20/2014).  Her family history includes Multiple sclerosis in her sister and sister; Sleep apnea in her father.  Review of Systems  Constitutional: Negative for fever and chills.  Gastrointestinal: Negative for nausea.  Genitourinary: Negative.   Musculoskeletal: Positive for joint pain. Negative for myalgias and neck pain.  Skin: Negative for itching and rash.  Neurological: Negative for dizziness and headaches.  Psychiatric/Behavioral: Negative for depression.    OBJECTIVE  Her  height is 5\' 5"  (1.651 m) and weight is 253 lb (114.76 kg). Her oral temperature is 97.6 F (36.4 C). Her blood pressure is 122/80 and her pulse is 73. Her respiration is 16 and oxygen saturation is 96%.  The patient's body mass index is 42.1 kg/(m^2).  Physical Exam  Constitutional: She is oriented to person, place, and time. She appears well-developed and well-nourished. No distress.  Cardiovascular: Normal rate.   Respiratory: Effort normal.  GI: Soft. Bowel sounds are normal.  Musculoskeletal:       Feet:  Neurological: She is alert and oriented to person, place, and time.  Skin: Skin is warm and dry. She is not diaphoretic.  Psychiatric: She has a normal mood and affect.    Results for orders placed or performed in visit on 04/21/15 (from the past 24 hour(s))  POCT urinalysis dipstick     Status: None   Collection Time: 04/21/15  9:56 AM  Result Value Ref Range   Color, UA yellow    Clarity, UA clear    Glucose, UA neg    Bilirubin, UA neg    Ketones, UA neg  Spec Grav, UA >=1.030    Blood, UA neg    pH, UA 5.5    Protein, UA neg    Urobilinogen, UA 0.2    Nitrite, UA neg    Leukocytes, UA Negative Negative  POCT UA - Microscopic Only     Status: None   Collection Time: 04/21/15  9:56 AM  Result Value Ref Range   WBC, Ur, HPF, POC 1-3    RBC,  urine, microscopic 0-1    Bacteria, U Microscopic few    Mucus, UA neg    Epithelial cells, urine per micros 3-8    Crystals, Ur, HPF, POC 2-3    Casts, Ur, LPF, POC neg    Yeast, UA neg   POCT CBC     Status: Abnormal   Collection Time: 04/21/15 11:42 AM  Result Value Ref Range   WBC 10.1 4.6 - 10.2 K/uL   Lymph, poc 1.9 0.6 - 3.4   POC LYMPH PERCENT 18.6 10 - 50 %L   MID (cbc) 0.7 0 - 0.9   POC MID % 7.0 0 - 12 %M   POC Granulocyte 7.5 (A) 2 - 6.9   Granulocyte percent 74.4 37 - 80 %G   RBC 4.78 4.04 - 5.48 M/uL   Hemoglobin 9.5 (A) 12.2 - 16.2 g/dL   HCT, POC 16.1 (A) 09.6 - 47.9 %   MCV 67.7 (A) 80 - 97 fL   MCH, POC 20.0 (A) 27 - 31.2 pg   MCHC 29.5 (A) 31.8 - 35.4 g/dL   RDW, POC 04.5 %   Platelet Count, POC 422 142 - 424 K/uL   MPV 7.3 0 - 99.8 fL    ASSESSMENT & PLAN  Lilley was seen today for foot pain and cystitis.  Diagnoses and all orders for this visit:   Dysuria: Her overall picture points in the direction of an acute gout flare.  However, given the risks associated with missing a urinary tract infection will cover with Cipro and treat specifically for gout.   Orders: -     POCT urinalysis dipstick -     POCT UA - Microscopic Only -     Urine culture -     Uric Acid -     POCT CBC -     ciprofloxacin (CIPRO) 500 MG tablet; Take 1 tablet (500 mg total) by mouth 2 (two) times daily.  Pain in joint, ankle and foot, right Orders: -     POCT CBC -     colchicine 0.6 MG tablet; Take 1 tablet (0.6 mg total) by mouth daily.    The patient was advised to call or come back to clinic if she does not see an improvement in symptoms, or worsens with the above plan.   Deliah Boston, MHS, PA-C [facility] 04/21/2015 4:32 PM

## 2015-04-22 LAB — URINE CULTURE

## 2015-06-02 ENCOUNTER — Other Ambulatory Visit: Payer: Self-pay | Admitting: Orthopedic Surgery

## 2015-06-02 DIAGNOSIS — S92211A Displaced fracture of cuboid bone of right foot, initial encounter for closed fracture: Secondary | ICD-10-CM

## 2015-06-08 ENCOUNTER — Ambulatory Visit
Admission: RE | Admit: 2015-06-08 | Discharge: 2015-06-08 | Disposition: A | Discharge status: Home / Self Care | Payer: Medicare Other | Source: Ambulatory Visit | Attending: Orthopedic Surgery | Admitting: Orthopedic Surgery

## 2015-06-08 DIAGNOSIS — S92211A Displaced fracture of cuboid bone of right foot, initial encounter for closed fracture: Secondary | ICD-10-CM

## 2015-06-12 ENCOUNTER — Emergency Department (HOSPITAL_COMMUNITY)
Admission: EM | Admit: 2015-06-12 | Discharge: 2015-06-12 | Disposition: A | Discharge status: Home / Self Care | Payer: Medicare Other | Attending: Emergency Medicine | Admitting: Emergency Medicine

## 2015-06-12 ENCOUNTER — Ambulatory Visit (INDEPENDENT_AMBULATORY_CARE_PROVIDER_SITE_OTHER): Payer: Medicare Other | Admitting: Cardiology

## 2015-06-12 ENCOUNTER — Emergency Department (HOSPITAL_COMMUNITY): Payer: Medicare Other

## 2015-06-12 ENCOUNTER — Encounter: Payer: Self-pay | Admitting: Cardiology

## 2015-06-12 ENCOUNTER — Encounter (HOSPITAL_COMMUNITY): Payer: Self-pay | Admitting: Emergency Medicine

## 2015-06-12 VITALS — BP 162/84 | HR 84 | Ht 65.0 in | Wt 248.0 lb

## 2015-06-12 DIAGNOSIS — Z79899 Other long term (current) drug therapy: Secondary | ICD-10-CM | POA: Diagnosis not present

## 2015-06-12 DIAGNOSIS — I10 Essential (primary) hypertension: Secondary | ICD-10-CM | POA: Diagnosis not present

## 2015-06-12 DIAGNOSIS — Z87891 Personal history of nicotine dependence: Secondary | ICD-10-CM | POA: Insufficient documentation

## 2015-06-12 DIAGNOSIS — Z8739 Personal history of other diseases of the musculoskeletal system and connective tissue: Secondary | ICD-10-CM | POA: Diagnosis not present

## 2015-06-12 DIAGNOSIS — R0600 Dyspnea, unspecified: Secondary | ICD-10-CM | POA: Diagnosis not present

## 2015-06-12 DIAGNOSIS — R112 Nausea with vomiting, unspecified: Secondary | ICD-10-CM | POA: Insufficient documentation

## 2015-06-12 DIAGNOSIS — F419 Anxiety disorder, unspecified: Secondary | ICD-10-CM | POA: Insufficient documentation

## 2015-06-12 DIAGNOSIS — Z880 Allergy status to penicillin: Secondary | ICD-10-CM | POA: Diagnosis not present

## 2015-06-12 DIAGNOSIS — E669 Obesity, unspecified: Secondary | ICD-10-CM | POA: Diagnosis not present

## 2015-06-12 DIAGNOSIS — N23 Unspecified renal colic: Secondary | ICD-10-CM | POA: Insufficient documentation

## 2015-06-12 DIAGNOSIS — F329 Major depressive disorder, single episode, unspecified: Secondary | ICD-10-CM | POA: Diagnosis not present

## 2015-06-12 DIAGNOSIS — Z8669 Personal history of other diseases of the nervous system and sense organs: Secondary | ICD-10-CM | POA: Insufficient documentation

## 2015-06-12 DIAGNOSIS — I480 Paroxysmal atrial fibrillation: Secondary | ICD-10-CM | POA: Diagnosis not present

## 2015-06-12 DIAGNOSIS — Z8719 Personal history of other diseases of the digestive system: Secondary | ICD-10-CM | POA: Insufficient documentation

## 2015-06-12 DIAGNOSIS — R109 Unspecified abdominal pain: Secondary | ICD-10-CM | POA: Diagnosis present

## 2015-06-12 LAB — COMPREHENSIVE METABOLIC PANEL
ALBUMIN: 4.1 g/dL (ref 3.5–5.0)
ALK PHOS: 153 U/L — AB (ref 38–126)
ALT: 14 U/L (ref 14–54)
ANION GAP: 12 (ref 5–15)
AST: 19 U/L (ref 15–41)
BUN: 18 mg/dL (ref 6–20)
CALCIUM: 9.3 mg/dL (ref 8.9–10.3)
CHLORIDE: 102 mmol/L (ref 101–111)
CO2: 24 mmol/L (ref 22–32)
Creatinine, Ser: 1.43 mg/dL — ABNORMAL HIGH (ref 0.44–1.00)
GFR calc non Af Amer: 38 mL/min — ABNORMAL LOW (ref >60)
GFR, EST AFRICAN AMERICAN: 44 mL/min — AB (ref >60)
Glucose, Bld: 135 mg/dL — ABNORMAL HIGH (ref 65–99)
POTASSIUM: 3.4 mmol/L — AB (ref 3.5–5.1)
SODIUM: 138 mmol/L (ref 135–145)
Total Bilirubin: 0.5 mg/dL (ref 0.3–1.2)
Total Protein: 8.3 g/dL — ABNORMAL HIGH (ref 6.5–8.1)

## 2015-06-12 LAB — URINALYSIS, ROUTINE W REFLEX MICROSCOPIC
Bilirubin Urine: NEGATIVE
Glucose, UA: NEGATIVE mg/dL
Ketones, ur: NEGATIVE mg/dL
Nitrite: NEGATIVE
Protein, ur: NEGATIVE mg/dL
SPECIFIC GRAVITY, URINE: 1.021 (ref 1.005–1.030)
UROBILINOGEN UA: 1 mg/dL (ref 0.0–1.0)
pH: 6 (ref 5.0–8.0)

## 2015-06-12 LAB — CBC
HEMATOCRIT: 37.8 % (ref 36.0–46.0)
HEMOGLOBIN: 10.7 g/dL — AB (ref 12.0–15.0)
MCH: 21.3 pg — AB (ref 26.0–34.0)
MCHC: 28.3 g/dL — ABNORMAL LOW (ref 30.0–36.0)
MCV: 75.3 fL — ABNORMAL LOW (ref 78.0–100.0)
Platelets: 484 10*3/uL — ABNORMAL HIGH (ref 150–400)
RBC: 5.02 MIL/uL (ref 3.87–5.11)
RDW: 20.7 % — ABNORMAL HIGH (ref 11.5–15.5)
WBC: 14.3 10*3/uL — ABNORMAL HIGH (ref 4.0–10.5)

## 2015-06-12 LAB — URINE MICROSCOPIC-ADD ON

## 2015-06-12 LAB — LIPASE, BLOOD: LIPASE: 17 U/L — AB (ref 22–51)

## 2015-06-12 MED ORDER — ONDANSETRON HCL 4 MG/2ML IJ SOLN
4.0000 mg | Freq: Once | INTRAMUSCULAR | Status: AC
  Administered 2015-06-12: 4 mg via INTRAVENOUS
  Filled 2015-06-12: qty 2

## 2015-06-12 MED ORDER — MORPHINE SULFATE (PF) 4 MG/ML IV SOLN
4.0000 mg | Freq: Once | INTRAVENOUS | Status: AC
  Administered 2015-06-12: 4 mg via INTRAVENOUS
  Filled 2015-06-12: qty 1

## 2015-06-12 MED ORDER — TAMSULOSIN HCL 0.4 MG PO CAPS: 0.4000 mg | ORAL_CAPSULE | Freq: Every day | ORAL | Status: DC

## 2015-06-12 MED ORDER — ONDANSETRON HCL 4 MG PO TABS: 4.0000 mg | ORAL_TABLET | Freq: Four times a day (QID) | ORAL | Status: DC

## 2015-06-12 MED ORDER — OXYCODONE-ACETAMINOPHEN 5-325 MG PO TABS: 1.0000 | ORAL_TABLET | ORAL | Status: DC | PRN

## 2015-06-12 NOTE — ED Notes (Signed)
Pt reports she began to have R flank pain yesterday which resolved after 4 hours. Today began to have RLQ pain. Hx of kidney stones. Has not seen any blood in urine or dysuria.

## 2015-06-12 NOTE — ED Provider Notes (Signed)
CSN: [id number]     Arrival date & time 06/12/15  1602 History   First MD Initiated Contact with Patient 06/12/15 1729     Chief Complaint  Patient presents with  . Flank Pain     (Consider location/radiation/quality/duration/timing/severity/associated sxs/prior Treatment) HPI Comments: Patient presents to the ER for evaluation of right flank pain. Patient reports that she had onset of severe right flank pain 5 nights ago. It lasted for approximately 5 hours and then improved. Patient started having pain again last night and it has needed until today. She has had a history of kidney stones. She has had associated nausea and vomiting, no diarrhea. Pain started in the right lower back area, now is in that area as well as somewhat into the right lower abdomen. She has not had any fever.  Patient is a 64 y.o. female presenting with flank pain.  Flank Pain    Past Medical History  Diagnosis Date  . Hypertension   . Hyperlipidemia   . Obesity   . GERD (gastroesophageal reflux disease)     RARE- OTC ANTACID IF NEEDED--PAST HX OF ULCER  . 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  PT ALSO HAS LOWER BACK PAIN AT TIMES  . Anxiety   . Depression   . Kidney stones     PT PASSED STONES--NO KNOWN STONES AT PRESENT TIMES  . Urinary incontinence   . Ulcer   . Abnormality of gait 10/02/2013  . Lumbago 10/02/2013  . Obesity   . Periodic limb movement disorder (PLMD) 12/30/2013  . Dysrhythmia     PT STATES IRREGULAR HB FELT TO BE RELATED TO STRESS--NEGATIVE CARDIAC WORK UP IN 2011 WITH DR. Shirlee Latch [facility] --NUCLEAR STRESS TEST, ECHO AND HOLTER  MONITOR RESULTS IN EPIC   . Sleep apnea 02/10/2012  . OSA (obstructive sleep apnea) 12/30/2013    Very mild OSA with REM accentuation,  tested 11-29-13 at [facility] sleep, Dr Frances Furbish .   Marland Kitchen Restless leg syndrome   . Shortness of breath dyspnea   . Headache     wakes up with headache    Past Surgical History  Procedure Laterality Date  .  Colon surgery    . Surgery for bowel obstruction and removal of part of stomach  in 2000  2000  . Bilateral bunionectomy 1979    . Total knee arthroplasty  02/16/2012    Procedure: TOTAL KNEE ARTHROPLASTY;  Surgeon: Javier Docker, MD;  Location: [facility];  Service: Orthopedics;  Laterality: Left;  . Breast surgery      biopsy   . Small intestine surgery    . Total knee arthroplasty Right 11/20/2014    Procedure: RIGHT TOTAL KNEE ARTHROPLASTY;  Surgeon: Javier Docker, MD;  Location: [facility];  Service: Orthopedics;  Laterality: Right;   Family History  Problem Relation Age of Onset  . Sleep apnea Father   . Multiple sclerosis Sister   . Multiple sclerosis Sister    Social History  Substance Use Topics  . Smoking status: Former Smoker -- 2.00 packs/day for 20 years    Types: Cigarettes    Quit date: 10/17/1988  . Smokeless tobacco: Never Used  . Alcohol Use: No   OB History    No data available     Review of Systems  Gastrointestinal: Positive for nausea and vomiting.  Genitourinary: Positive for flank pain.  All other systems reviewed and are negative.     Allergies  Zolpidem tartrate and Amoxicillin  Home Medications   Prior to Admission medications   Medication Sig Start Date End Date Taking? Authorizing Provider  Asenapine Maleate (SAPHRIS) 10 MG SUBL Place 10 mg under the tongue at bedtime.    Yes Historical Provider, MD  Aspirin-Salicylamide-Caffeine (BC HEADACHE POWDER PO) Take 1-2 packets by mouth daily as needed (headache).    Yes Historical Provider, MD  buPROPion (WELLBUTRIN XL) 150 MG 24 hr tablet Take 150 mg by mouth every morning.   Yes Historical Provider, MD  citalopram (CELEXA) 20 MG tablet Take 20 mg by mouth at bedtime.    Yes Historical Provider, MD  cyanocobalamin (,VITAMIN B-12,) 1000 MCG/ML injection Inject 1,000 mcg into the muscle every 30 (thirty) days.   Yes Historical Provider, MD  furosemide (LASIX) 20 MG tablet Take 1 tablet by mouth Mon-Fri  04/26/15  Yes Historical Provider, MD  gabapentin (NEURONTIN) 600 MG tablet Take 600 mg by mouth 4 (four) times daily.   Yes Historical Provider, MD  iron polysaccharides (NIFEREX) 150 MG capsule Take 150 mg by mouth daily.   Yes Historical Provider, MD  lisinopril (PRINIVIL,ZESTRIL) 10 MG tablet Take 10 mg by mouth daily.   Yes Historical Provider, MD  Multiple Vitamins-Minerals (MULTIVITAMIN & MINERAL PO) Take 1 tablet by mouth daily.   Yes Historical Provider, MD  nortriptyline (PAMELOR) 10 MG capsule Take 2 capsules at bedtime 05/29/15  Yes Historical Provider, MD  verapamil (CALAN) 120 MG tablet TK 1 T PO BID 06/02/15  Yes Historical Provider, MD  rOPINIRole (REQUIP) 0.25 MG tablet TAKE 1 TABLET BY MOUTH AT BEDTIME( NEEDS APPOINTMENT) Patient not taking: Reported on 06/12/2015 03/04/15   Porfirio Mylar Dohmeier, MD   BP 172/87 mmHg  Pulse 87  Temp(Src) 98.8 F (37.1 C) (Oral)  Resp 20  SpO2 94% Physical Exam  Constitutional: She is oriented to person, place, and time. She appears well-developed and well-nourished. No distress.  HENT:  Head: Normocephalic and atraumatic.  Right Ear: Hearing normal.  Left Ear: Hearing normal.  Nose: Nose normal.  Mouth/Throat: Oropharynx is clear and moist and mucous membranes are normal.  Eyes: Conjunctivae and EOM are normal. Pupils are equal, round, and reactive to light.  Neck: Normal range of motion. Neck supple.  Cardiovascular: Regular rhythm, S1 normal and S2 normal.  Exam reveals no gallop and no friction rub.   No murmur heard. Pulmonary/Chest: Effort normal and breath sounds normal. No respiratory distress. She exhibits no tenderness.  Abdominal: Soft. Normal appearance and bowel sounds are normal. There is no hepatosplenomegaly. There is no tenderness. There is CVA tenderness (R). There is no rebound, no guarding, no tenderness at McBurney's point and negative Murphy's sign. No hernia.  Musculoskeletal: Normal range of motion.  Neurological: She is  alert and oriented to person, place, and time. She has normal strength. No cranial nerve deficit or sensory deficit. Coordination normal. GCS eye subscore is 4. GCS verbal subscore is 5. GCS motor subscore is 6.  Skin: Skin is warm, dry and intact. No rash noted. No cyanosis.  Psychiatric: She has a normal mood and affect. Her speech is normal and behavior is normal. Thought content normal.  Nursing note and vitals reviewed.   ED Course  Procedures (including critical care time) Labs Review Labs Reviewed  URINALYSIS, ROUTINE W REFLEX MICROSCOPIC (NOT AT [facility]) - Abnormal; Notable for the following:    APPearance CLOUDY (*)    Hgb urine dipstick TRACE (*)    Leukocytes, UA SMALL (*)  All other components within normal limits  LIPASE, BLOOD - Abnormal; Notable for the following:    Lipase 17 (*)    All other components within normal limits  COMPREHENSIVE METABOLIC PANEL - Abnormal; Notable for the following:    Potassium 3.4 (*)    Glucose, Bld 135 (*)    Creatinine, Ser 1.43 (*)    Total Protein 8.3 (*)    Alkaline Phosphatase 153 (*)    GFR calc non Af Amer 38 (*)    GFR calc Af Amer 44 (*)    All other components within normal limits  CBC - Abnormal; Notable for the following:    WBC 14.3 (*)    Hemoglobin 10.7 (*)    MCV 75.3 (*)    MCH 21.3 (*)    MCHC 28.3 (*)    RDW 20.7 (*)    Platelets 484 (*)    All other components within normal limits  URINE MICROSCOPIC-ADD ON    Imaging Review Ct Renal Stone Study  06/12/2015   CLINICAL DATA:  Right flank pain since yesterday.  EXAM: CT ABDOMEN AND PELVIS WITHOUT CONTRAST  TECHNIQUE: Multidetector CT imaging of the abdomen and pelvis was performed following the standard protocol without IV contrast.  COMPARISON:  CT scan 11/30/2009  FINDINGS: Lower chest: The lung bases are clear except for dependent atelectasis. No pleural effusion or pulmonary lesion. The heart is normal in size. No pericardial effusion. The distal esophagus is  grossly normal. Surgical changes are noted at the GE junction.  Hepatobiliary: No focal hepatic lesions or intrahepatic biliary dilatation. The gallbladder is mildly distended but no acute inflammation. No common bile duct dilatation.  Pancreas: No mass, inflammation or ductal dilatation.  Spleen: Normal size.  No focal lesions.  Adrenals/Urinary Tract: The adrenal glands are normal. The left kidney is normal except for a simple cyst. No left-sided renal or ureteral calculi. No bladder calculi.  The right kidney demonstrates fairly marked hydronephrosis and extensive perinephric interstitial change in fluid suggesting high-grade obstruction and forniceal rupture. There is a 4 mm UPJ calculus and possible underlying UPJ obstruction. No distal ureteral calculi.  Stomach/Bowel: Surgical changes from gastric bypass surgery. No complicating features. The small bowel and colon are unremarkable. The terminal ileum and appendix are normal.  Vascular/Lymphatic: No mesenteric or retroperitoneal mass or adenopathy. Small scattered lymph nodes are noted. Moderate to advanced atherosclerotic calcifications involving the aorta. No focal aneurysm.  Other: The uterus is normal except for scattered calcified fibroids. The ovaries are normal. No pelvic mass, adenopathy or free pelvic fluid collections. No inguinal mass or adenopathy.  Musculoskeletal: No significant bony findings. Non isthmic spondylolisthesis noted at L5.  IMPRESSION: 1. 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 2. Surgical changes from bypass surgery.  No complicating features. 3. Moderate to advanced atherosclerotic calcifications involving the aorta and iliac arteries.   Electronically Signed   By: Rudie Meyer M.D.   On: 06/12/2015 19:20   I have personally reviewed and evaluated these images and lab results as part of my  medical decision-making.   EKG Interpretation None      MDM   Final diagnoses:  None  Renal Colic  Patient presents to the ER for evaluation of right flank pain. She had significant improvement with administration of morphine and Zofran. CAT scan does confirm UPJ stone. There is also significant hydronephrosis and perinephric stranding that  could represent forniceal rupture. Findings and urinalysis was reviewed with urology, Dr. Greggory Brandy. He did not feel that the patient would require any emergent interventions. He recommends treatment with analgesia. He did not recommend antibiotic coverage. Call [facility] Monday morning for repeat evaluation.    Gilda Crease, MD 06/12/15 2130

## 2015-06-12 NOTE — Progress Notes (Signed)
Cardiology Office Note   Date:  06/12/2015   ID:  [name], DOB 01/29/1951, MRN [id number]  PCP:  Garlan Fillers, MD  Cardiologist:   Donato Schultz, MD       History of Present Illness: [name] is a 64 y.o. female who presents for evaluation of paroxysmal atrial fibrillation and dyspnea. 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  She was previously seen by Dr. Algie Coffer in the hospital setting on 11/25/14 and with subsequent office visits, EKGs were performed. She was prescribed metoprolol. At one point, her heart rate was in the 47 range. She was having hair loss. She felt significant shortness of breath. She stopped her Lopressor and now feels better.  At one point she received an EKG and because of her dyspnea, he recommended heart catheterization. She wished to ask for a second opinion/referral to me to discuss.  GERD - mild nausea  Echo performed in March 2016 shows normal ejection fraction.  EKG on 11/23/14 demonstrates or fibrillation with heart rate in the 120s.  In review of Dr. Norval Gable office note from 04/03/15, dyspnea on exertion had deteriorated and mild symptoms possibly due to restrictive physiology given worsening symptoms when bending over or perhaps from occult CAD. Requested to see me.  Sharp pain side. She is worried that she may be developing another kidney stone.  She says she had she had a stress test here over 7 years ago.   Past Medical History  Diagnosis Date  . Hypertension   . Hyperlipidemia   . Obesity   . GERD (gastroesophageal reflux disease)     RARE- OTC ANTACID IF NEEDED--PAST HX OF ULCER  . 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  PT ALSO HAS LOWER BACK PAIN AT TIMES  . Anxiety   . Depression   . Kidney stones     PT PASSED STONES--NO KNOWN STONES AT PRESENT TIMES  . Urinary incontinence   . Ulcer   . Abnormality of gait 10/02/2013  . Lumbago  10/02/2013  . Obesity   . Periodic limb movement disorder (PLMD) 12/30/2013  . Dysrhythmia     PT STATES IRREGULAR HB FELT TO BE RELATED TO STRESS--NEGATIVE CARDIAC WORK UP IN 2011 WITH DR. Shirlee Latch [facility] --NUCLEAR STRESS TEST, ECHO AND HOLTER  MONITOR RESULTS IN EPIC   . Sleep apnea 02/10/2012  . OSA (obstructive sleep apnea) 12/30/2013    Very mild OSA with REM accentuation,  tested 11-29-13 at [facility] sleep, Dr Frances Furbish .   Marland Kitchen Restless leg syndrome   . Shortness of breath dyspnea   . Headache     wakes up with headache     Past Surgical History  Procedure Laterality Date  . Colon surgery    . Surgery for bowel obstruction and removal of part of stomach  in 2000  2000  . Bilateral bunionectomy 1979    . Total knee arthroplasty  02/16/2012    Procedure: TOTAL KNEE ARTHROPLASTY;  Surgeon: Javier Docker, MD;  Location: [facility];  Service: Orthopedics;  Laterality: Left;  . Breast surgery      biopsy   . Small intestine surgery    . Total knee arthroplasty Right 11/20/2014    Procedure: RIGHT TOTAL KNEE ARTHROPLASTY;  Surgeon: Javier Docker, MD;  Location: [facility];  Service: Orthopedics;  Laterality: Right;     Current Outpatient Prescriptions  Medication  Sig Dispense Refill  . Asenapine Maleate (SAPHRIS) 10 MG SUBL Place 10 mg under the tongue at bedtime.     . Aspirin-Salicylamide-Caffeine (BC HEADACHE POWDER PO) Take 1-2 packets by mouth daily.    Marland Kitchen buPROPion (WELLBUTRIN XL) 150 MG 24 hr tablet Take 150 mg by mouth every morning.    . citalopram (CELEXA) 20 MG tablet Take 20 mg by mouth at bedtime.     . cyanocobalamin (,VITAMIN B-12,) 1000 MCG/ML injection Inject 1,000 mcg into the muscle every 30 (thirty) days.    . furosemide (LASIX) 20 MG tablet TK 1 T PO D UTD  1  . gabapentin (NEURONTIN) 600 MG tablet Take 600 mg by mouth 4 (four) times daily.    . iron polysaccharides (NIFEREX) 150 MG capsule Take 150 mg by mouth daily.    Marland Kitchen lisinopril (PRINIVIL,ZESTRIL) 10 MG tablet  Take 10 mg by mouth daily.    . Multiple Vitamins-Minerals (MULTIVITAMIN & MINERAL PO) Take 1 tablet by mouth daily.    . nortriptyline (PAMELOR) 10 MG capsule TK 1 C PO QHS  4  . rOPINIRole (REQUIP) 0.25 MG tablet TAKE 1 TABLET BY MOUTH AT BEDTIME( NEEDS APPOINTMENT) 15 tablet 0  . verapamil (CALAN) 120 MG tablet TK 1 T PO BID  1   No current facility-administered medications for this visit.    Allergies:   Zolpidem tartrate and Amoxicillin    Social History:  The patient  reports that she quit smoking about 26 years ago. Her smoking use included Cigarettes. She has a 40 pack-year smoking history. She has never used smokeless tobacco. She reports that she does not drink alcohol or use illicit drugs.   Family History:  The patient's family history includes Multiple sclerosis in her sister and sister; Sleep apnea in her father.   Mother pacer. AFIB   ROS:  Please see the history of present illness.   Otherwise, review of systems are positive for shortness of breath, foot pain, foot ulcer, anxiety.   All other systems are reviewed and negative.    PHYSICAL EXAM: VS:  BP 162/84 mmHg  Pulse 84  Ht 5\' 5"  (1.651 m)  Wt 248 lb (112.492 kg)  BMI 41.27 kg/m2  SpO2 98% , BMI Body mass index is 41.27 kg/(m^2). GEN: Well nourished, well developed, in no acute distress HEENT: normal Neck: no JVD, carotid bruits, or masses Cardiac: RRR; no murmurs, rubs, or gallops,no edema rare ectopy Respiratory:  clear to auscultation bilaterally, normal work of breathing GI: soft, nontender, nondistended, + BS obese MS: no deformity or atrophy Skin: warm and dry, no rash, right leg cast in place Neuro:  Strength and sensation are intact Psych: euthymic mood, full affect   EKG:  EKG is ordered today. 06/12/15-sinus rhythm, premature atrial complexes noted. QTc interval was 495 Prior EKG demonstrated atrial fibrillation as described above.   ECHO: 11/25/14 - Left ventricle: The cavity size was normal.  Systolic function wasnormal. The estimated ejection fraction was in the range of 50%to 55%. Wall motion was normal; there were no regional wall motion abnormalities. - Mitral valve: There was mild regurgitation. - Left atrium: The atrium was mildly dilated. - Right atrium: The atrium was mildly dilated.  Recent Labs: 11/24/2014: TSH 1.367 11/25/2014: Platelets 430* 04/21/2015: ALT 12; BUN 14; Creat 0.92; Hemoglobin 9.5*; Potassium 4.0; Sodium 143    Lipid Panel No results found for: CHOL, TRIG, HDL, CHOLHDL, VLDL, LDLCALC, LDLDIRECT    Wt Readings from Last 3 Encounters:  06/12/15 248 lb (112.492 kg)  04/21/15 253 lb (114.76 kg)  02/24/15 249 lb 12.8 oz (113.309 kg)      Other studies Reviewed: Additional studies/ records that were reviewed today include: Labs, EKGs, hospitalization, echo. Review of the above records demonstrates: As above   ASSESSMENT AND PLAN:  1.  Paroxysmal atrial fibrillation-no longer nature fibrillation. Reversible cause from surgery. We discussed the implications of atrial fibrillation including stroke. If atrial fibrillation returns, anticoagulation would be warranted. CHADS-VASc at least 2, female, hypertension.  2. Dyspnea-there was concern previously that this may be secondary to coronary artery disease. This is a possibility area I will first start off with a pharmacologic stress test to see if there is any evidence of ischemia. She was previously told that she may need a heart catheterization. We will first start with a noninvasive study. Other reasons for dyspnea, deconditioning, morbid obesity.  3. Hair loss-this had subsided quite a bit after stopping metoprolol.  4. Essential hypertension-mildly elevated today. Continue with current antihypertensives reviewed. Verapamil will be happy with rate control if atrial fibrillation returns.  5. Bipolar-medications reviewed. Per primary team, psychiatry.   Current medicines are reviewed at length  with the patient today.  The patient does not have concerns regarding medicines.  The following changes have been made:  no change  Labs/ tests ordered today include: Stress test   Orders Placed This Encounter  Procedures  . Myocardial Perfusion Imaging  . EKG 12-Lead     Disposition:   FU with Skains in 6 months. I will follow up with results of stress test.  Signed, Donato Schultz, MD  06/12/2015 9:43 AM    [facility] [address] [place], [place], [place]  [address] Phone: [phone]; Fax: [phone]

## 2015-06-12 NOTE — Discharge Instructions (Signed)
Ureteral Colic (Kidney Stones) °Ureteral colic is the result of a condition when kidney stones form inside the kidney. Once kidney stones are formed they may move into the tube that connects the kidney with the bladder (ureter). If this occurs, this condition may cause pain (colic) in the ureter.  °CAUSES  °Pain is caused by stone movement in the ureter and the obstruction caused by the stone. °SYMPTOMS  °The pain comes and goes as the ureter contracts around the stone. The pain is usually intense, sharp, and stabbing in character. The location of the pain may move as the stone moves through the ureter. When the stone is near the kidney the pain is usually located in the back and radiates to the belly (abdomen). When the stone is ready to pass into the bladder the pain is often located in the lower abdomen on the side the stone is located. At this location, the symptoms may mimic those of a urinary tract infection with urinary frequency. Once the stone is located here it often passes into the bladder and the pain disappears completely. °TREATMENT  °· Your caregiver will provide you with medicine for pain relief. °· You may require specialized follow-up X-rays. °· The absence of pain does not always mean that the stone has passed. It may have just stopped moving. If the urine remains completely obstructed, it can cause loss of kidney function or even complete destruction of the involved kidney. It is your responsibility and in your interest that X-rays and follow-ups as suggested by your caregiver are completed. Relief of pain without passage of the stone can be associated with severe damage to the kidney, including loss of kidney function on that side. °· If your stone does not pass on its own, additional measures may be taken by your caregiver to ensure its removal. °HOME CARE INSTRUCTIONS  °· Increase your fluid intake. Water is the preferred fluid since juices containing vitamin C may acidify the urine making it  less likely for certain stones (uric acid stones) to pass. °· Strain all urine. A strainer will be provided. Keep all particulate matter or stones for your caregiver to inspect. °· Take your pain medicine as directed. °· Make a follow-up appointment with your caregiver as directed. °· Remember that the goal is passage of your stone. The absence of pain does not mean the stone is gone. Follow your caregiver's instructions. °· Only take over-the-counter or prescription medicines for pain, discomfort, or fever as directed by your caregiver. °SEEK MEDICAL CARE IF:  °· Pain cannot be controlled with the prescribed medicine. °· You have a fever. °· Pain continues for longer than your caregiver advises it should. °· There is a change in the pain, and you develop chest discomfort or constant abdominal pain. °· You feel faint or pass out. °MAKE SURE YOU:  °· Understand these instructions. °· Will watch your condition. °· Will get help right away if you are not doing well or get worse. °Document Released: 06/22/2005 Document Revised: 01/07/2013 Document Reviewed: 03/09/2011 °ExitCare® Patient Information ©2015 [employer]. This information is not intended to replace advice given to you by your health care provider. Make sure you discuss any questions you have with your health care provider. ° °

## 2015-06-12 NOTE — ED Notes (Signed)
Pt has returned from CT renal study,  Dr Blinda Leatherwood aware of pt having nausea and pain,  This writer informed pt that she would receive medications as soon as orders placed,  Pt is alert and oriented in NAD

## 2015-06-12 NOTE — Patient Instructions (Signed)
Medication Instructions:  The current medical regimen is effective;  continue present plan and medications.  Testing/Procedures: Your physician has requested that you have a lexiscan myoview. For further information please visit www.[email or web address]. Please follow instruction sheet, as given.  Follow-Up: Follow up in 6 months with Dr. Skains.  You will receive a letter in the mail 2 months before you are due.  Please call us when you receive this letter to schedule your follow up appointment.  Thank you for choosing [facility]!!     

## 2015-06-17 ENCOUNTER — Telehealth (HOSPITAL_COMMUNITY): Payer: Self-pay | Admitting: *Deleted

## 2015-06-17 NOTE — Telephone Encounter (Signed)
Left message on voicemail in reference to upcoming appointment scheduled for 06/22/15. Phone number given for a call back so details instructions can be given. Mary J Smith, RN 

## 2015-06-18 ENCOUNTER — Encounter: Payer: Self-pay | Admitting: Cardiology

## 2015-06-18 ENCOUNTER — Telehealth (HOSPITAL_COMMUNITY): Payer: Self-pay | Admitting: *Deleted

## 2015-06-18 NOTE — Telephone Encounter (Signed)
Left message on voicemail in reference to upcoming appointment scheduled for 06/22/15. Phone number given for a call back so details instructions can be given. Mary J Smith, RN 

## 2015-06-22 ENCOUNTER — Ambulatory Visit (HOSPITAL_COMMUNITY): Payer: Medicare Other | Attending: Cardiology

## 2015-06-22 DIAGNOSIS — R0600 Dyspnea, unspecified: Secondary | ICD-10-CM | POA: Insufficient documentation

## 2015-06-22 DIAGNOSIS — I10 Essential (primary) hypertension: Secondary | ICD-10-CM | POA: Insufficient documentation

## 2015-06-22 MED ORDER — TECHNETIUM TC 99M SESTAMIBI GENERIC - CARDIOLITE
32.2000 | Freq: Once | INTRAVENOUS | Status: AC | PRN
  Administered 2015-06-22: 32.2 via INTRAVENOUS

## 2015-06-22 MED ORDER — REGADENOSON 0.4 MG/5ML IV SOLN
0.4000 mg | Freq: Once | INTRAVENOUS | Status: AC
  Administered 2015-06-22: 0.4 mg via INTRAVENOUS

## 2015-06-23 ENCOUNTER — Ambulatory Visit (HOSPITAL_COMMUNITY): Payer: Medicare Other | Attending: Cardiology

## 2015-06-23 LAB — MYOCARDIAL PERFUSION IMAGING
CHL CUP NUCLEAR SRS: 1
CHL CUP NUCLEAR SSS: 4
CHL CUP RESTING HR STRESS: 68 {beats}/min
LHR: 0.27
LV dias vol: 110 mL
LV sys vol: 44 mL
Peak HR: 77 {beats}/min
SDS: 3
TID: 0.92

## 2015-06-23 MED ORDER — TECHNETIUM TC 99M SESTAMIBI GENERIC - CARDIOLITE
31.7000 | Freq: Once | INTRAVENOUS | Status: AC | PRN
  Administered 2015-06-23: 31.7 via INTRAVENOUS

## 2015-10-14 DIAGNOSIS — L97521 Non-pressure chronic ulcer of other part of left foot limited to breakdown of skin: Secondary | ICD-10-CM | POA: Diagnosis not present

## 2015-10-14 DIAGNOSIS — S92211D Displaced fracture of cuboid bone of right foot, subsequent encounter for fracture with routine healing: Secondary | ICD-10-CM | POA: Diagnosis not present

## 2015-10-14 DIAGNOSIS — M14671 Charcot's joint, right ankle and foot: Secondary | ICD-10-CM | POA: Diagnosis not present

## 2015-11-03 DIAGNOSIS — R7309 Other abnormal glucose: Secondary | ICD-10-CM | POA: Diagnosis not present

## 2015-11-03 DIAGNOSIS — Z6841 Body Mass Index (BMI) 40.0 and over, adult: Secondary | ICD-10-CM | POA: Diagnosis not present

## 2015-11-03 DIAGNOSIS — I739 Peripheral vascular disease, unspecified: Secondary | ICD-10-CM | POA: Diagnosis not present

## 2015-11-03 DIAGNOSIS — F3189 Other bipolar disorder: Secondary | ICD-10-CM | POA: Diagnosis not present

## 2015-11-03 DIAGNOSIS — G608 Other hereditary and idiopathic neuropathies: Secondary | ICD-10-CM | POA: Diagnosis not present

## 2015-11-03 DIAGNOSIS — L97529 Non-pressure chronic ulcer of other part of left foot with unspecified severity: Secondary | ICD-10-CM | POA: Diagnosis not present

## 2015-11-03 DIAGNOSIS — I480 Paroxysmal atrial fibrillation: Secondary | ICD-10-CM | POA: Diagnosis not present

## 2015-11-26 DIAGNOSIS — N200 Calculus of kidney: Secondary | ICD-10-CM | POA: Diagnosis not present

## 2015-12-09 DIAGNOSIS — L97521 Non-pressure chronic ulcer of other part of left foot limited to breakdown of skin: Secondary | ICD-10-CM | POA: Diagnosis not present

## 2015-12-09 DIAGNOSIS — S92211D Displaced fracture of cuboid bone of right foot, subsequent encounter for fracture with routine healing: Secondary | ICD-10-CM | POA: Diagnosis not present

## 2015-12-09 DIAGNOSIS — M14671 Charcot's joint, right ankle and foot: Secondary | ICD-10-CM | POA: Diagnosis not present

## 2016-01-12 DIAGNOSIS — N3941 Urge incontinence: Secondary | ICD-10-CM | POA: Diagnosis not present

## 2016-01-12 DIAGNOSIS — N202 Calculus of kidney with calculus of ureter: Secondary | ICD-10-CM | POA: Diagnosis not present

## 2016-01-12 DIAGNOSIS — R3915 Urgency of urination: Secondary | ICD-10-CM | POA: Diagnosis not present

## 2016-01-12 DIAGNOSIS — N1330 Unspecified hydronephrosis: Secondary | ICD-10-CM | POA: Diagnosis not present

## 2016-01-12 DIAGNOSIS — N183 Chronic kidney disease, stage 3 (moderate): Secondary | ICD-10-CM | POA: Diagnosis not present

## 2016-01-13 DIAGNOSIS — L97521 Non-pressure chronic ulcer of other part of left foot limited to breakdown of skin: Secondary | ICD-10-CM | POA: Diagnosis not present

## 2016-01-13 DIAGNOSIS — M14671 Charcot's joint, right ankle and foot: Secondary | ICD-10-CM | POA: Diagnosis not present

## 2016-01-26 DIAGNOSIS — M14671 Charcot's joint, right ankle and foot: Secondary | ICD-10-CM | POA: Diagnosis not present

## 2016-02-04 DIAGNOSIS — R7309 Other abnormal glucose: Secondary | ICD-10-CM | POA: Diagnosis not present

## 2016-02-04 DIAGNOSIS — I10 Essential (primary) hypertension: Secondary | ICD-10-CM | POA: Diagnosis not present

## 2016-02-04 DIAGNOSIS — Z6841 Body Mass Index (BMI) 40.0 and over, adult: Secondary | ICD-10-CM | POA: Diagnosis not present

## 2016-02-04 DIAGNOSIS — F3189 Other bipolar disorder: Secondary | ICD-10-CM | POA: Diagnosis not present

## 2016-02-04 DIAGNOSIS — I480 Paroxysmal atrial fibrillation: Secondary | ICD-10-CM | POA: Diagnosis not present

## 2016-02-04 DIAGNOSIS — Z1389 Encounter for screening for other disorder: Secondary | ICD-10-CM | POA: Diagnosis not present

## 2016-02-04 DIAGNOSIS — G608 Other hereditary and idiopathic neuropathies: Secondary | ICD-10-CM | POA: Diagnosis not present

## 2016-02-16 DIAGNOSIS — R3915 Urgency of urination: Secondary | ICD-10-CM | POA: Diagnosis not present

## 2016-02-16 DIAGNOSIS — N3941 Urge incontinence: Secondary | ICD-10-CM | POA: Diagnosis not present

## 2016-02-24 DIAGNOSIS — M545 Low back pain: Secondary | ICD-10-CM | POA: Diagnosis not present

## 2016-02-24 DIAGNOSIS — M14671 Charcot's joint, right ankle and foot: Secondary | ICD-10-CM | POA: Diagnosis not present

## 2016-02-24 DIAGNOSIS — M4316 Spondylolisthesis, lumbar region: Secondary | ICD-10-CM | POA: Diagnosis not present

## 2016-02-24 DIAGNOSIS — L97521 Non-pressure chronic ulcer of other part of left foot limited to breakdown of skin: Secondary | ICD-10-CM | POA: Diagnosis not present

## 2016-02-25 DIAGNOSIS — I10 Essential (primary) hypertension: Secondary | ICD-10-CM | POA: Diagnosis not present

## 2016-02-25 DIAGNOSIS — Z6841 Body Mass Index (BMI) 40.0 and over, adult: Secondary | ICD-10-CM | POA: Diagnosis not present

## 2016-02-29 DIAGNOSIS — I10 Essential (primary) hypertension: Secondary | ICD-10-CM | POA: Diagnosis not present

## 2016-03-01 DIAGNOSIS — N3941 Urge incontinence: Secondary | ICD-10-CM | POA: Diagnosis not present

## 2016-03-01 DIAGNOSIS — R3915 Urgency of urination: Secondary | ICD-10-CM | POA: Diagnosis not present

## 2016-03-17 DIAGNOSIS — R278 Other lack of coordination: Secondary | ICD-10-CM | POA: Diagnosis not present

## 2016-03-17 DIAGNOSIS — M6281 Muscle weakness (generalized): Secondary | ICD-10-CM | POA: Diagnosis not present

## 2016-03-17 DIAGNOSIS — N3946 Mixed incontinence: Secondary | ICD-10-CM | POA: Diagnosis not present

## 2016-03-17 DIAGNOSIS — R3914 Feeling of incomplete bladder emptying: Secondary | ICD-10-CM | POA: Diagnosis not present

## 2016-03-25 DIAGNOSIS — M14671 Charcot's joint, right ankle and foot: Secondary | ICD-10-CM | POA: Diagnosis not present

## 2016-03-25 DIAGNOSIS — L97521 Non-pressure chronic ulcer of other part of left foot limited to breakdown of skin: Secondary | ICD-10-CM | POA: Diagnosis not present

## 2016-04-14 DIAGNOSIS — N3941 Urge incontinence: Secondary | ICD-10-CM | POA: Diagnosis not present

## 2016-04-14 DIAGNOSIS — R3915 Urgency of urination: Secondary | ICD-10-CM | POA: Diagnosis not present

## 2016-04-14 DIAGNOSIS — R278 Other lack of coordination: Secondary | ICD-10-CM | POA: Diagnosis not present

## 2016-04-14 DIAGNOSIS — M6281 Muscle weakness (generalized): Secondary | ICD-10-CM | POA: Diagnosis not present

## 2016-04-27 DIAGNOSIS — M14671 Charcot's joint, right ankle and foot: Secondary | ICD-10-CM | POA: Diagnosis not present

## 2016-04-27 DIAGNOSIS — L97521 Non-pressure chronic ulcer of other part of left foot limited to breakdown of skin: Secondary | ICD-10-CM | POA: Diagnosis not present

## 2016-04-29 DIAGNOSIS — M62838 Other muscle spasm: Secondary | ICD-10-CM | POA: Diagnosis not present

## 2016-04-29 DIAGNOSIS — R3914 Feeling of incomplete bladder emptying: Secondary | ICD-10-CM | POA: Diagnosis not present

## 2016-04-29 DIAGNOSIS — M6281 Muscle weakness (generalized): Secondary | ICD-10-CM | POA: Diagnosis not present

## 2016-04-29 DIAGNOSIS — N3946 Mixed incontinence: Secondary | ICD-10-CM | POA: Diagnosis not present

## 2016-04-29 DIAGNOSIS — N3941 Urge incontinence: Secondary | ICD-10-CM | POA: Diagnosis not present

## 2016-04-29 DIAGNOSIS — R278 Other lack of coordination: Secondary | ICD-10-CM | POA: Diagnosis not present

## 2016-05-06 DIAGNOSIS — R278 Other lack of coordination: Secondary | ICD-10-CM | POA: Diagnosis not present

## 2016-05-06 DIAGNOSIS — R152 Fecal urgency: Secondary | ICD-10-CM | POA: Diagnosis not present

## 2016-05-06 DIAGNOSIS — R3915 Urgency of urination: Secondary | ICD-10-CM | POA: Diagnosis not present

## 2016-05-06 DIAGNOSIS — M62838 Other muscle spasm: Secondary | ICD-10-CM | POA: Diagnosis not present

## 2016-05-06 DIAGNOSIS — N3946 Mixed incontinence: Secondary | ICD-10-CM | POA: Diagnosis not present

## 2016-05-06 DIAGNOSIS — M6281 Muscle weakness (generalized): Secondary | ICD-10-CM | POA: Diagnosis not present

## 2016-05-06 DIAGNOSIS — R151 Fecal smearing: Secondary | ICD-10-CM | POA: Diagnosis not present

## 2016-05-20 DIAGNOSIS — R152 Fecal urgency: Secondary | ICD-10-CM | POA: Diagnosis not present

## 2016-05-20 DIAGNOSIS — M62838 Other muscle spasm: Secondary | ICD-10-CM | POA: Diagnosis not present

## 2016-05-20 DIAGNOSIS — R151 Fecal smearing: Secondary | ICD-10-CM | POA: Diagnosis not present

## 2016-05-20 DIAGNOSIS — M6281 Muscle weakness (generalized): Secondary | ICD-10-CM | POA: Diagnosis not present

## 2016-05-20 DIAGNOSIS — R278 Other lack of coordination: Secondary | ICD-10-CM | POA: Diagnosis not present

## 2016-05-20 DIAGNOSIS — N3946 Mixed incontinence: Secondary | ICD-10-CM | POA: Diagnosis not present

## 2016-05-25 DIAGNOSIS — L97521 Non-pressure chronic ulcer of other part of left foot limited to breakdown of skin: Secondary | ICD-10-CM | POA: Diagnosis not present

## 2016-05-25 DIAGNOSIS — G609 Hereditary and idiopathic neuropathy, unspecified: Secondary | ICD-10-CM | POA: Diagnosis not present

## 2016-05-25 DIAGNOSIS — M14671 Charcot's joint, right ankle and foot: Secondary | ICD-10-CM | POA: Diagnosis not present

## 2016-05-26 DIAGNOSIS — N3946 Mixed incontinence: Secondary | ICD-10-CM | POA: Diagnosis not present

## 2016-05-26 DIAGNOSIS — M62838 Other muscle spasm: Secondary | ICD-10-CM | POA: Diagnosis not present

## 2016-05-26 DIAGNOSIS — M6281 Muscle weakness (generalized): Secondary | ICD-10-CM | POA: Diagnosis not present

## 2016-05-26 DIAGNOSIS — R151 Fecal smearing: Secondary | ICD-10-CM | POA: Diagnosis not present

## 2016-05-26 DIAGNOSIS — R152 Fecal urgency: Secondary | ICD-10-CM | POA: Diagnosis not present

## 2016-06-21 DIAGNOSIS — R1011 Right upper quadrant pain: Secondary | ICD-10-CM | POA: Diagnosis not present

## 2016-06-21 DIAGNOSIS — M791 Myalgia: Secondary | ICD-10-CM | POA: Diagnosis not present

## 2016-06-21 DIAGNOSIS — E11621 Type 2 diabetes mellitus with foot ulcer: Secondary | ICD-10-CM | POA: Diagnosis not present

## 2016-06-21 DIAGNOSIS — M9901 Segmental and somatic dysfunction of cervical region: Secondary | ICD-10-CM | POA: Diagnosis not present

## 2016-06-21 DIAGNOSIS — M542 Cervicalgia: Secondary | ICD-10-CM | POA: Diagnosis not present

## 2016-06-21 DIAGNOSIS — Z6841 Body Mass Index (BMI) 40.0 and over, adult: Secondary | ICD-10-CM | POA: Diagnosis not present

## 2016-06-21 DIAGNOSIS — M9902 Segmental and somatic dysfunction of thoracic region: Secondary | ICD-10-CM | POA: Diagnosis not present

## 2016-06-21 DIAGNOSIS — I10 Essential (primary) hypertension: Secondary | ICD-10-CM | POA: Diagnosis not present

## 2016-06-21 DIAGNOSIS — G458 Other transient cerebral ischemic attacks and related syndromes: Secondary | ICD-10-CM | POA: Diagnosis not present

## 2016-06-21 DIAGNOSIS — I480 Paroxysmal atrial fibrillation: Secondary | ICD-10-CM | POA: Diagnosis not present

## 2016-06-21 DIAGNOSIS — M5383 Other specified dorsopathies, cervicothoracic region: Secondary | ICD-10-CM | POA: Diagnosis not present

## 2016-06-21 DIAGNOSIS — M50322 Other cervical disc degeneration at C5-C6 level: Secondary | ICD-10-CM | POA: Diagnosis not present

## 2016-06-22 DIAGNOSIS — M5383 Other specified dorsopathies, cervicothoracic region: Secondary | ICD-10-CM | POA: Diagnosis not present

## 2016-06-22 DIAGNOSIS — M50322 Other cervical disc degeneration at C5-C6 level: Secondary | ICD-10-CM | POA: Diagnosis not present

## 2016-06-22 DIAGNOSIS — M9901 Segmental and somatic dysfunction of cervical region: Secondary | ICD-10-CM | POA: Diagnosis not present

## 2016-06-22 DIAGNOSIS — M9902 Segmental and somatic dysfunction of thoracic region: Secondary | ICD-10-CM | POA: Diagnosis not present

## 2016-06-22 DIAGNOSIS — M791 Myalgia: Secondary | ICD-10-CM | POA: Diagnosis not present

## 2016-06-22 DIAGNOSIS — M14671 Charcot's joint, right ankle and foot: Secondary | ICD-10-CM | POA: Diagnosis not present

## 2016-06-22 DIAGNOSIS — L97521 Non-pressure chronic ulcer of other part of left foot limited to breakdown of skin: Secondary | ICD-10-CM | POA: Diagnosis not present

## 2016-06-24 DIAGNOSIS — M50322 Other cervical disc degeneration at C5-C6 level: Secondary | ICD-10-CM | POA: Diagnosis not present

## 2016-06-24 DIAGNOSIS — M5383 Other specified dorsopathies, cervicothoracic region: Secondary | ICD-10-CM | POA: Diagnosis not present

## 2016-06-24 DIAGNOSIS — M9901 Segmental and somatic dysfunction of cervical region: Secondary | ICD-10-CM | POA: Diagnosis not present

## 2016-06-24 DIAGNOSIS — M9902 Segmental and somatic dysfunction of thoracic region: Secondary | ICD-10-CM | POA: Diagnosis not present

## 2016-06-24 DIAGNOSIS — M791 Myalgia: Secondary | ICD-10-CM | POA: Diagnosis not present

## 2016-06-27 DIAGNOSIS — M5383 Other specified dorsopathies, cervicothoracic region: Secondary | ICD-10-CM | POA: Diagnosis not present

## 2016-06-27 DIAGNOSIS — M50322 Other cervical disc degeneration at C5-C6 level: Secondary | ICD-10-CM | POA: Diagnosis not present

## 2016-06-27 DIAGNOSIS — M9902 Segmental and somatic dysfunction of thoracic region: Secondary | ICD-10-CM | POA: Diagnosis not present

## 2016-06-27 DIAGNOSIS — M791 Myalgia: Secondary | ICD-10-CM | POA: Diagnosis not present

## 2016-06-27 DIAGNOSIS — M9901 Segmental and somatic dysfunction of cervical region: Secondary | ICD-10-CM | POA: Diagnosis not present

## 2016-06-28 DIAGNOSIS — M9902 Segmental and somatic dysfunction of thoracic region: Secondary | ICD-10-CM | POA: Diagnosis not present

## 2016-06-28 DIAGNOSIS — M5136 Other intervertebral disc degeneration, lumbar region: Secondary | ICD-10-CM | POA: Diagnosis not present

## 2016-06-28 DIAGNOSIS — M9903 Segmental and somatic dysfunction of lumbar region: Secondary | ICD-10-CM | POA: Diagnosis not present

## 2016-06-28 DIAGNOSIS — M5383 Other specified dorsopathies, cervicothoracic region: Secondary | ICD-10-CM | POA: Diagnosis not present

## 2016-06-28 DIAGNOSIS — M50322 Other cervical disc degeneration at C5-C6 level: Secondary | ICD-10-CM | POA: Diagnosis not present

## 2016-06-28 DIAGNOSIS — M791 Myalgia: Secondary | ICD-10-CM | POA: Diagnosis not present

## 2016-06-28 DIAGNOSIS — M9901 Segmental and somatic dysfunction of cervical region: Secondary | ICD-10-CM | POA: Diagnosis not present

## 2016-06-28 DIAGNOSIS — M545 Low back pain: Secondary | ICD-10-CM | POA: Diagnosis not present

## 2016-06-30 DIAGNOSIS — G603 Idiopathic progressive neuropathy: Secondary | ICD-10-CM | POA: Diagnosis not present

## 2016-06-30 DIAGNOSIS — M9903 Segmental and somatic dysfunction of lumbar region: Secondary | ICD-10-CM | POA: Diagnosis not present

## 2016-06-30 DIAGNOSIS — M545 Low back pain: Secondary | ICD-10-CM | POA: Diagnosis not present

## 2016-06-30 DIAGNOSIS — M9902 Segmental and somatic dysfunction of thoracic region: Secondary | ICD-10-CM | POA: Diagnosis not present

## 2016-06-30 DIAGNOSIS — M5136 Other intervertebral disc degeneration, lumbar region: Secondary | ICD-10-CM | POA: Diagnosis not present

## 2016-06-30 DIAGNOSIS — M791 Myalgia: Secondary | ICD-10-CM | POA: Diagnosis not present

## 2016-06-30 DIAGNOSIS — M9901 Segmental and somatic dysfunction of cervical region: Secondary | ICD-10-CM | POA: Diagnosis not present

## 2016-06-30 DIAGNOSIS — M50322 Other cervical disc degeneration at C5-C6 level: Secondary | ICD-10-CM | POA: Diagnosis not present

## 2016-06-30 DIAGNOSIS — M5383 Other specified dorsopathies, cervicothoracic region: Secondary | ICD-10-CM | POA: Diagnosis not present

## 2016-07-04 DIAGNOSIS — M791 Myalgia: Secondary | ICD-10-CM | POA: Diagnosis not present

## 2016-07-04 DIAGNOSIS — G603 Idiopathic progressive neuropathy: Secondary | ICD-10-CM | POA: Diagnosis not present

## 2016-07-04 DIAGNOSIS — M545 Low back pain: Secondary | ICD-10-CM | POA: Diagnosis not present

## 2016-07-04 DIAGNOSIS — M5383 Other specified dorsopathies, cervicothoracic region: Secondary | ICD-10-CM | POA: Diagnosis not present

## 2016-07-04 DIAGNOSIS — M50322 Other cervical disc degeneration at C5-C6 level: Secondary | ICD-10-CM | POA: Diagnosis not present

## 2016-07-04 DIAGNOSIS — M9903 Segmental and somatic dysfunction of lumbar region: Secondary | ICD-10-CM | POA: Diagnosis not present

## 2016-07-04 DIAGNOSIS — M9901 Segmental and somatic dysfunction of cervical region: Secondary | ICD-10-CM | POA: Diagnosis not present

## 2016-07-04 DIAGNOSIS — M5136 Other intervertebral disc degeneration, lumbar region: Secondary | ICD-10-CM | POA: Diagnosis not present

## 2016-07-04 DIAGNOSIS — M9902 Segmental and somatic dysfunction of thoracic region: Secondary | ICD-10-CM | POA: Diagnosis not present

## 2016-07-05 DIAGNOSIS — M545 Low back pain: Secondary | ICD-10-CM | POA: Diagnosis not present

## 2016-07-05 DIAGNOSIS — M50322 Other cervical disc degeneration at C5-C6 level: Secondary | ICD-10-CM | POA: Diagnosis not present

## 2016-07-05 DIAGNOSIS — M791 Myalgia: Secondary | ICD-10-CM | POA: Diagnosis not present

## 2016-07-05 DIAGNOSIS — M9902 Segmental and somatic dysfunction of thoracic region: Secondary | ICD-10-CM | POA: Diagnosis not present

## 2016-07-05 DIAGNOSIS — M5136 Other intervertebral disc degeneration, lumbar region: Secondary | ICD-10-CM | POA: Diagnosis not present

## 2016-07-05 DIAGNOSIS — M5383 Other specified dorsopathies, cervicothoracic region: Secondary | ICD-10-CM | POA: Diagnosis not present

## 2016-07-05 DIAGNOSIS — M9901 Segmental and somatic dysfunction of cervical region: Secondary | ICD-10-CM | POA: Diagnosis not present

## 2016-07-05 DIAGNOSIS — M9903 Segmental and somatic dysfunction of lumbar region: Secondary | ICD-10-CM | POA: Diagnosis not present

## 2016-07-05 DIAGNOSIS — G603 Idiopathic progressive neuropathy: Secondary | ICD-10-CM | POA: Diagnosis not present

## 2016-07-07 DIAGNOSIS — N281 Cyst of kidney, acquired: Secondary | ICD-10-CM | POA: Diagnosis not present

## 2016-07-07 DIAGNOSIS — G459 Transient cerebral ischemic attack, unspecified: Secondary | ICD-10-CM | POA: Diagnosis not present

## 2016-07-07 DIAGNOSIS — I6523 Occlusion and stenosis of bilateral carotid arteries: Secondary | ICD-10-CM | POA: Diagnosis not present

## 2016-07-07 DIAGNOSIS — K8020 Calculus of gallbladder without cholecystitis without obstruction: Secondary | ICD-10-CM | POA: Diagnosis not present

## 2016-07-28 DIAGNOSIS — I480 Paroxysmal atrial fibrillation: Secondary | ICD-10-CM | POA: Diagnosis not present

## 2016-07-28 DIAGNOSIS — D649 Anemia, unspecified: Secondary | ICD-10-CM | POA: Diagnosis not present

## 2016-07-28 DIAGNOSIS — G458 Other transient cerebral ischemic attacks and related syndromes: Secondary | ICD-10-CM | POA: Diagnosis not present

## 2016-07-28 DIAGNOSIS — I10 Essential (primary) hypertension: Secondary | ICD-10-CM | POA: Diagnosis not present

## 2016-07-28 DIAGNOSIS — M859 Disorder of bone density and structure, unspecified: Secondary | ICD-10-CM | POA: Diagnosis not present

## 2016-07-28 DIAGNOSIS — R7309 Other abnormal glucose: Secondary | ICD-10-CM | POA: Diagnosis not present

## 2016-07-28 DIAGNOSIS — D6489 Other specified anemias: Secondary | ICD-10-CM | POA: Diagnosis not present

## 2016-07-28 DIAGNOSIS — Z6841 Body Mass Index (BMI) 40.0 and over, adult: Secondary | ICD-10-CM | POA: Diagnosis not present

## 2016-07-29 DIAGNOSIS — M21612 Bunion of left foot: Secondary | ICD-10-CM | POA: Diagnosis not present

## 2016-07-29 DIAGNOSIS — L97521 Non-pressure chronic ulcer of other part of left foot limited to breakdown of skin: Secondary | ICD-10-CM | POA: Diagnosis not present

## 2016-07-29 DIAGNOSIS — M14671 Charcot's joint, right ankle and foot: Secondary | ICD-10-CM | POA: Diagnosis not present

## 2016-08-30 DIAGNOSIS — F319 Bipolar disorder, unspecified: Secondary | ICD-10-CM | POA: Diagnosis not present

## 2016-08-30 DIAGNOSIS — F4310 Post-traumatic stress disorder, unspecified: Secondary | ICD-10-CM | POA: Diagnosis not present

## 2016-08-30 DIAGNOSIS — F411 Generalized anxiety disorder: Secondary | ICD-10-CM | POA: Diagnosis not present

## 2016-09-18 DIAGNOSIS — S66911A Strain of unspecified muscle, fascia and tendon at wrist and hand level, right hand, initial encounter: Secondary | ICD-10-CM | POA: Diagnosis not present

## 2016-09-18 DIAGNOSIS — I10 Essential (primary) hypertension: Secondary | ICD-10-CM | POA: Diagnosis not present

## 2016-09-18 DIAGNOSIS — Z87891 Personal history of nicotine dependence: Secondary | ICD-10-CM | POA: Diagnosis not present

## 2016-10-27 DIAGNOSIS — I10 Essential (primary) hypertension: Secondary | ICD-10-CM | POA: Diagnosis not present

## 2016-10-27 DIAGNOSIS — Z6841 Body Mass Index (BMI) 40.0 and over, adult: Secondary | ICD-10-CM | POA: Diagnosis not present

## 2016-10-27 DIAGNOSIS — M14679 Charcot's joint, unspecified ankle and foot: Secondary | ICD-10-CM | POA: Diagnosis not present

## 2016-10-27 DIAGNOSIS — I480 Paroxysmal atrial fibrillation: Secondary | ICD-10-CM | POA: Diagnosis not present

## 2016-10-27 DIAGNOSIS — F3189 Other bipolar disorder: Secondary | ICD-10-CM | POA: Diagnosis not present

## 2016-10-27 DIAGNOSIS — E11621 Type 2 diabetes mellitus with foot ulcer: Secondary | ICD-10-CM | POA: Diagnosis not present

## 2016-10-27 DIAGNOSIS — R42 Dizziness and giddiness: Secondary | ICD-10-CM | POA: Diagnosis not present

## 2016-10-27 DIAGNOSIS — Z1389 Encounter for screening for other disorder: Secondary | ICD-10-CM | POA: Diagnosis not present

## 2016-10-28 ENCOUNTER — Encounter (INDEPENDENT_AMBULATORY_CARE_PROVIDER_SITE_OTHER): Payer: Self-pay

## 2016-10-28 ENCOUNTER — Ambulatory Visit (INDEPENDENT_AMBULATORY_CARE_PROVIDER_SITE_OTHER): Payer: PPO | Admitting: Cardiology

## 2016-10-28 ENCOUNTER — Encounter: Payer: Self-pay | Admitting: Cardiology

## 2016-10-28 VITALS — BP 180/94 | HR 97 | Ht 64.5 in | Wt 296.0 lb

## 2016-10-28 DIAGNOSIS — Z01810 Encounter for preprocedural cardiovascular examination: Secondary | ICD-10-CM

## 2016-10-28 DIAGNOSIS — R002 Palpitations: Secondary | ICD-10-CM

## 2016-10-28 DIAGNOSIS — Z7901 Long term (current) use of anticoagulants: Secondary | ICD-10-CM | POA: Diagnosis not present

## 2016-10-28 DIAGNOSIS — I482 Chronic atrial fibrillation: Secondary | ICD-10-CM | POA: Diagnosis not present

## 2016-10-28 DIAGNOSIS — I4821 Permanent atrial fibrillation: Secondary | ICD-10-CM

## 2016-10-28 MED ORDER — VERAPAMIL HCL ER 180 MG PO TBCR: 180.0000 mg | EXTENDED_RELEASE_TABLET | Freq: Two times a day (BID) | ORAL | 3 refills | Status: DC

## 2016-10-28 NOTE — Progress Notes (Signed)
Cardiology Office Note   Date:  10/28/2016   ID:  Yvette ShutterCarolyn I Orozco, DOB 12-17-1950, MRN [id number]  PCP:  Garlan FillersPATERSON,DANIEL G, MD  Cardiologist:   Donato SchultzMark Deeandra Jerry, MD       History of Present Illness: [name] is a 66 y.o. female who presents for follow up of persistent atrial fibrillation and dyspnea. 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  Since her last visit, she has been persistently in atrial fibrillation. She sometimes feels an irregular beat but not all the time.  Had slurred speech, put on Eliquis. Sometimes feels afib. One episode CP not relieved with pepcid. Atypical discomfort, sharp. GERD. Dr. Jarold MottoPatterson started the Eliquis.   Transient BP elevated with wrist sprain.   Dr. Victorino DikeHewitt. Foot surgery pending.   She was previously seen by Dr. Algie CofferKadakia in the hospital setting on 11/25/14 and with subsequent office visits, EKGs were performed. She was prescribed metoprolol. At one point, her heart rate was in the 47 range. She was having hair loss. She felt significant shortness of breath. She stopped her Lopressor and now feels better.  At one point she received an EKG and because of her dyspnea, he recommended heart catheterization. She wished to ask for a second opinion/referral to me to discuss. A nuclear stress test was performed at that time in 2016 and was low risk.  GERD - mild nausea  Echo performed in March 2016 shows normal ejection fraction.  EKG on 11/23/14 demonstrates or fibrillation with heart rate in the 120s.   Past Medical History:  Diagnosis Date  . Abnormality of gait 10/02/2013  . Anxiety   . 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  PT ALSO HAS LOWER BACK PAIN AT TIMES  . Depression   . Dysrhythmia    PT STATES IRREGULAR HB FELT TO BE RELATED TO STRESS--NEGATIVE CARDIAC WORK UP IN 2011 WITH DR. Shirlee [facility]atch[facility]LEAN [facility] --NUCLEAR STRESS TEST, ECHO AND HOLTER  MONITOR RESULTS IN  EPIC   . GERD (gastroesophageal reflux disease)    RARE- OTC ANTACID IF NEEDED--PAST HX OF ULCER  . Headache    wakes up with headache   . Hyperlipidemia   . Hypertension   . Kidney stones    PT PASSED STONES--NO KNOWN STONES AT PRESENT TIMES  . Lumbago 10/02/2013  . Obesity   . Obesity   . OSA (obstructive sleep apnea) 12/30/2013   Very mild OSA with REM accentuation,  [facility], Dr Frances FurbishAthar .   Marland Kitchen. Periodic limb movement disorder (PLMD) 12/30/2013  . Restless leg syndrome   . Shortness of breath dyspnea   . Sleep apnea 02/10/2012  . Ulcer (HCC)   . Urinary incontinence     Past Surgical History:  Procedure Laterality Date  . BILATERAL BUNIONECTOMY 1979    . BREAST SURGERY     biopsy   . COLON SURGERY    . SMALL INTESTINE SURGERY    . SURGERY FOR BOWEL OBSTRUCTION AND REMOVAL OF PART OF STOMACH  IN 2000  2000  . TOTAL KNEE ARTHROPLASTY  02/16/2012   Procedure: TOTAL KNEE ARTHROPLASTY;  Surgeon: Javier DockerJeffrey C Beane, MD;  Location: [facility];  Service: Orthopedics;  Laterality: Left;  . TOTAL KNEE ARTHROPLASTY Right 11/20/2014   Procedure: RIGHT TOTAL KNEE ARTHROPLASTY;  Surgeon: Javier DockerJeffrey C Beane, MD;  Location: [facility];  Service: Orthopedics;  Laterality: Right;  Current Outpatient Prescriptions  Medication Sig Dispense Refill  . Asenapine Maleate (SAPHRIS) 10 MG SUBL Place 10 mg under the tongue at bedtime.     Marland Kitchen buPROPion (WELLBUTRIN XL) 150 MG 24 hr tablet Take 150 mg by mouth every morning.    . carvedilol (COREG) 6.25 MG tablet Take 6.25 mg by mouth 2 (two) times daily with a meal.    . cyanocobalamin (,VITAMIN B-12,) 1000 MCG/ML injection Inject 1,000 mcg into the muscle every 30 (thirty) days.    Marland Kitchen ELIQUIS 5 MG TABS tablet Take 5 mg by mouth 2 (two) times daily.    Marland Kitchen FLUoxetine (PROZAC) 20 MG capsule Take 20 mg by mouth daily.    Marland Kitchen gabapentin (NEURONTIN) 600 MG tablet Take 600 mg by mouth 4 (four) times daily.    . iron polysaccharides (NIFEREX) 150 MG capsule  Take 150 mg by mouth daily.    Marland Kitchen lisinopril (PRINIVIL,ZESTRIL) 10 MG tablet Take 10 mg by mouth 2 (two) times daily.     . meclizine (ANTIVERT) 25 MG tablet Take 25 mg by mouth 3 (three) times daily as needed for dizziness.    . Multiple Vitamins-Minerals (MULTIVITAMIN & MINERAL PO) Take 1 tablet by mouth daily.    . verapamil (CALAN-SR) 180 MG CR tablet Take 1 tablet (180 mg total) by mouth 2 (two) times daily. 180 tablet 3   No current facility-administered medications for this visit.     Allergies:   Zolpidem tartrate and Amoxicillin    Social History:  The patient  reports that she quit smoking about 28 years ago. Her smoking use included Cigarettes. She has a 40.00 pack-year smoking history. She has never used smokeless tobacco. She reports that she does not drink alcohol or use drugs.   Family History:  The patient's family history includes Multiple sclerosis in her sister and sister; Sleep apnea in her father.   Mother pacer. AFIB   ROS:  Please see the history of present illness.   Otherwise, review of systems are positive for shortness of breath, foot pain, foot ulcer, anxiety.   All other systems are reviewed and negative.    PHYSICAL EXAM: VS:  BP (!) 180/94   Pulse 97   Ht 5' 4.5" (1.638 m)   Wt 296 lb (134.3 kg)   LMP  (LMP Unknown)   BMI 50.02 kg/m  , BMI Body mass index is 50.02 kg/m. GEN: Well nourished, well developed, in no acute distress  HEENT: normal  Neck: no JVD, carotid bruits, or masses Cardiac: RRR; no murmurs, rubs, or gallops,no edema rare ectopy Respiratory:  clear to auscultation bilaterally, normal work of breathing GI: soft, nontender, nondistended, + BS obese MS: no deformity or atrophy  Skin: warm and dry, no rash, right leg cast in place Neuro:  Strength and sensation are intact Psych: euthymic mood, full affect   EKG:  EKG is ordered today. 10/28/16-atrial fibrillation heart rate 97 bpm 06/12/15-sinus rhythm, premature atrial complexes  noted. QTc interval was 495 Prior EKG demonstrated atrial fibrillation as described above.   ECHO: 11/25/14 - Left ventricle: The cavity size was normal. Systolic function wasnormal. The estimated ejection fraction was in the range of 50%to 55%. Wall motion was normal; there were no regional wall motion abnormalities. - Mitral valve: There was mild regurgitation. - Left atrium: The atrium was mildly dilated. - Right atrium: The atrium was mildly dilated.  Nuclear stress test 06/23/15  Nuclear stress EF: 60%.  There was no ST segment deviation  noted during stress.  Defect 1: There is a small defect of mild severity present in the mid anterior and apical anterior location. This is likely breast attenuation, but cannot rule out infarct with mild peri-infarct ischemia.  The study is normal.  This is a low risk study.  The left ventricular ejection fraction is normal (55-65%).     Recent Labs: No results found for requested labs within last 8760 hours.    Lipid Panel No results found for: CHOL, TRIG, HDL, CHOLHDL, VLDL, LDLCALC, LDLDIRECT    Wt Readings from Last 3 Encounters:  10/28/16 296 lb (134.3 kg)  06/22/15 248 lb (112.5 kg)  06/12/15 248 lb (112.5 kg)      Other studies Reviewed: Additional studies/ records that were reviewed today include: Labs, EKGs, hospitalization, echo. Review of the above records demonstrates: As above   ASSESSMENT AND PLAN:  1.  Permanent atrial fibrillation-Currently in atrial fibrillation 97 bpm, previously thought reversible cause from surgery. Now this seems to be persistent. We have discussed the pros and cons of rate versus rhythm control and we will continue with rate control strategy. The likelihood for her maintaining normal sinus rhythm at this point given her other comorbidities is low. We discussed the possibility of cardioversion and she remembers her parents undergoing this several times and she does not wish to proceed with  this. I think rate control is very reasonable for her.   - I will increase her verapamil from 120 up to 180 twice a day.   - I will keep her carvedilol at 6.25 mg twice a day. In the past she remembers going up to 12.5 and feeling more dizziness. We discussed the implications of atrial fibrillation including stroke. CHADS-VASc at least 2, female, hypertension.  2. Dyspnea-there was concern previously that this may be secondary to coronary artery disease. Stress test no evidence of ischemia.  Noninvasive study previously reassuring. Other reasons for dyspnea, deconditioning, morbid obesity. Do not need to repeat stress test at this time.  3. Hair loss-this had subsided quite a bit after stopping metoprolol. Previously discussed  4. Essential hypertension-mildly elevated today. At times, elevated, normal. Labile. Continue with current antihypertensives reviewed. Verapamil -  will be happy with rate control   5. Bipolar-medications reviewed. Per primary team, psychiatry.  6. Preoperative cardiac risk assessment-Dr. Victorino Dike orthopedic surgery, foot, I'm comfortable with her proceeding given the overall good rate control for atrial fibrillation and recent low risk nuclear stress test. She may proceed with low to moderate overall cardiac risk.   Current medicines are reviewed at length with the patient today.  The patient does not have concerns regarding medicines.  The following changes have been made:  no change  Labs/ tests ordered today include: Stress test   Orders Placed This Encounter  Procedures  . EKG 12-Lead     Disposition:   FU with Wava Kildow in 6 months. I will follow up with results of stress test.  Signed, Donato Schultz, MD  10/28/2016 9:55 AM    [facility] [address] [place], [place], [place]  [address] Phone: [phone]; Fax: [phone]

## 2016-10-28 NOTE — Patient Instructions (Signed)
Medication Instructions:  Please increase your Verapamil to 180 mg twice a day.  Continue all other medications as listed.  Follow-Up: Follow up in 2 months with Bary CastillaKaty Thompson, PA.  If you need a refill on your cardiac medications before your next appointment, please call your pharmacy.  Thank you for choosing [facility]!!

## 2016-11-10 DIAGNOSIS — H04123 Dry eye syndrome of bilateral lacrimal glands: Secondary | ICD-10-CM | POA: Diagnosis not present

## 2016-11-10 DIAGNOSIS — H25813 Combined forms of age-related cataract, bilateral: Secondary | ICD-10-CM | POA: Diagnosis not present

## 2016-11-10 DIAGNOSIS — E119 Type 2 diabetes mellitus without complications: Secondary | ICD-10-CM | POA: Diagnosis not present

## 2016-11-10 DIAGNOSIS — H16213 Exposure keratoconjunctivitis, bilateral: Secondary | ICD-10-CM | POA: Diagnosis not present

## 2016-11-11 DIAGNOSIS — I10 Essential (primary) hypertension: Secondary | ICD-10-CM | POA: Diagnosis not present

## 2016-11-11 DIAGNOSIS — Z6841 Body Mass Index (BMI) 40.0 and over, adult: Secondary | ICD-10-CM | POA: Diagnosis not present

## 2016-11-11 DIAGNOSIS — M549 Dorsalgia, unspecified: Secondary | ICD-10-CM | POA: Diagnosis not present

## 2016-11-11 DIAGNOSIS — N390 Urinary tract infection, site not specified: Secondary | ICD-10-CM | POA: Diagnosis not present

## 2016-12-04 DIAGNOSIS — A499 Bacterial infection, unspecified: Secondary | ICD-10-CM | POA: Diagnosis not present

## 2016-12-04 DIAGNOSIS — N390 Urinary tract infection, site not specified: Secondary | ICD-10-CM | POA: Diagnosis not present

## 2016-12-04 DIAGNOSIS — R300 Dysuria: Secondary | ICD-10-CM | POA: Diagnosis not present

## 2016-12-16 ENCOUNTER — Encounter: Payer: Self-pay | Admitting: Cardiology

## 2016-12-27 NOTE — Progress Notes (Signed)
Cardiology Office Note    Date:  12/29/2016   ID:  [name], DOB 06-11-1951, MRN [id number]  PCP:  Garlan Fillers, MD  Cardiologist:  Dr. Anne Fu  CC: follow up   History of Present Illness:  [name] is a 66 y.o. female with a history of persistent atrial fibrillation, bipolar disorder, charcot foot, morbid obesity, GERD who presents to clinic for follow up.   She has had a history of dyspnea. Echo performed in 11/2014 showed normal LVEF, mild MR, mild biatrial enlargement. Nuclear stress test 05/2016 was low risk. Dyspnea felt related to morbid obesity and deconditioning.   She was last seen by Dr. Anne Fu in 10/2016. She was cleared for orthopedic foot surgery. Verapamil was increased from  -->  daily for better HR/BP control. She had a previous allergy to lopressor (hair loss).   Today she presents to clinic for follow up. She never got foot surgery because she was worried about her afib. She has had several episodes of "something is not right in her body and she needs to sit still." Chronic dyspnea on exertion. She doesn't do much because of her foot problem. She does get some left sided chest pain that feels electric. Not related to exertion and comes on sporadically. No LE edema, orthopnea or PND. No dizziness or syncope. No blood in stool or urine. Occasionally has palpitations.      Past Medical History:  Diagnosis Date  . Abnormality of gait 10/02/2013  . Anxiety   . 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  PT ALSO HAS LOWER BACK PAIN AT TIMES  . Depression   . Dysrhythmia    PT STATES IRREGULAR HB FELT TO BE RELATED TO STRESS--NEGATIVE CARDIAC WORK UP IN 2011 WITH DR. Shirlee Latch [facility] --NUCLEAR STRESS TEST, ECHO AND HOLTER  MONITOR RESULTS IN EPIC   . GERD (gastroesophageal reflux disease)    RARE- OTC ANTACID IF NEEDED--PAST HX OF ULCER  . Headache    wakes up with headache   . Hyperlipidemia   . Hypertension   . Kidney  stones    PT PASSED STONES--NO KNOWN STONES AT PRESENT TIMES  . Lumbago 10/02/2013  . Obesity   . Obesity   . OSA (obstructive sleep apnea) 12/30/2013   Very mild OSA with REM accentuation,  tested 11-29-13 at [facility] sleep, Dr Frances Furbish .   Marland Kitchen Periodic limb movement disorder (PLMD) 12/30/2013  . Restless leg syndrome   . Shortness of breath dyspnea   . Sleep apnea 02/10/2012  . Ulcer (HCC)   . Urinary incontinence     Past Surgical History:  Procedure Laterality Date  . BILATERAL BUNIONECTOMY 1979    . BREAST SURGERY     biopsy   . COLON SURGERY    . SMALL INTESTINE SURGERY    . SURGERY FOR BOWEL OBSTRUCTION AND REMOVAL OF PART OF STOMACH  IN 2000  2000  . TOTAL KNEE ARTHROPLASTY  02/16/2012   Procedure: TOTAL KNEE ARTHROPLASTY;  Surgeon: Javier Docker, MD;  Location: [facility];  Service: Orthopedics;  Laterality: Left;  . TOTAL KNEE ARTHROPLASTY Right 11/20/2014   Procedure: RIGHT TOTAL KNEE ARTHROPLASTY;  Surgeon: Javier Docker, MD;  Location: [facility];  Service: Orthopedics;  Laterality: Right;    Current Medications: Outpatient Medications Prior to Visit  Medication Sig Dispense Refill  . Asenapine Maleate (SAPHRIS) 10 MG SUBL Place 10 mg under the tongue at bedtime.     Marland Kitchen  buPROPion (WELLBUTRIN XL) 150 MG 24 hr tablet Take 150 mg by mouth every morning.    . carvedilol (COREG) 6.25 MG tablet Take 6.25 mg by mouth 2 (two) times daily with a meal.    . cyanocobalamin (,VITAMIN B-12,) 1000 MCG/ML injection Inject 1,000 mcg into the muscle every 30 (thirty) days.    Marland Kitchen ELIQUIS 5 MG TABS tablet Take 5 mg by mouth 2 (two) times daily.    Marland Kitchen gabapentin (NEURONTIN) 600 MG tablet Take 600 mg by mouth 4 (four) times daily.    . iron polysaccharides (NIFEREX) 150 MG capsule Take 150 mg by mouth daily.    . meclizine (ANTIVERT) 25 MG tablet Take 25 mg by mouth 3 (three) times daily as needed for dizziness.    . Multiple Vitamins-Minerals (MULTIVITAMIN & MINERAL PO) Take 1 tablet by mouth daily.    Marland Kitchen  FLUoxetine (PROZAC) 20 MG capsule Take 20 mg by mouth daily.    Marland Kitchen lisinopril (PRINIVIL,ZESTRIL) 10 MG tablet Take 10 mg by mouth 2 (two) times daily.     . verapamil (CALAN-SR) 180 MG CR tablet Take 1 tablet (180 mg total) by mouth 2 (two) times daily. 180 tablet 3   No facility-administered medications prior to visit.      Allergies:   Zolpidem tartrate and Amoxicillin   Social History   Social History  . Marital status: Single    Spouse name: N/A  . Number of children: 0  . Years of education: 2   Social History Main Topics  . Smoking status: Former Smoker    Packs/day: 2.00    Years: 20.00    Types: Cigarettes    Quit date: 10/17/1988  . Smokeless tobacco: Never Used  . Alcohol use No  . Drug use: No     Comment: Marijuana - 25 years ago  . Sexual activity: Not Asked   Other Topics Concern  . None   Social History Narrative   Patient is single and lives alone.   Patient is disabled.   Patient has a college education.   Patient is right-handed.   Patient drinks two cokes per day.     Family History:  The patient's family history includes Multiple sclerosis in her sister and sister; Sleep apnea in her father.      ROS:   Please see the history of present illness.    ROS All other systems reviewed and are negative.   PHYSICAL EXAM:   VS:  BP (!) 190/78   Pulse (!) 53   Ht 5' 4.5" (1.638 m)   Wt 268 lb 1.9 oz (121.6 kg)   LMP  (LMP Unknown)   BMI 45.31 kg/m    GEN: Well nourished, well developed, in no acute distress, obese HEENT: normal  Neck: no JVD, carotid bruits, or masses Cardiac: bradycardia, regular; no murmurs, rubs, or gallops,no edema  Respiratory:  clear to auscultation bilaterally, normal work of breathing GI: soft, nontender, nondistended, + BS MS: no deformity or atrophy  Skin: warm and dry, no rash Neuro:  Alert and Oriented x 3, Strength and sensation are intact Psych: euthymic mood, full affect   Wt Readings from Last 3 Encounters:    12/29/16 268 lb 1.9 oz (121.6 kg)  10/28/16 296 lb (134.3 kg)  06/22/15 248 lb (112.5 kg)      Studies/Labs Reviewed:   EKG:  EKG is ordered today.  The ekg ordered today demonstrates sinus bradycardia HR 53  Recent Labs: No results found  for requested labs within last 8760 hours.   Lipid Panel No results found for: CHOL, TRIG, HDL, CHOLHDL, VLDL, LDLCALC, LDLDIRECT  Additional studies/ records that were reviewed today include:  ECHO: 11/25/14 - Left ventricle: The cavity size was normal. Systolic function wasnormal. The estimated ejection fraction was in the range of 50%to 55%. Wall motion was normal; there were no regional wall motion abnormalities. - Mitral valve: There was mild regurgitation. - Left atrium: The atrium was mildly dilated. - Right atrium: The atrium was mildly dilated.   Nuclear stress test 06/23/15  Nuclear stress EF: 60%.  There was no ST segment deviation noted during stress.  Defect 1: There is a small defect of mild severity present in the mid anterior and apical anterior location. This is likely breast attenuation, but cannot rule out infarct with mild peri-infarct ischemia.  The study is normal.  This is a low risk study.  The left ventricular ejection fraction is normal (55-65%).      ASSESSMENT & PLAN:   Paroxsymal atrial fibrillation: previously felt to be persistent but in sinus brady today. HR 53. She may be in and out of afib. Has some "dizzy- feels like something isn't right" feeling from time to time. I wonder if these are post conversion pauses. Will go back from Verapamil  BID -->  BID. Continue Coreg 6.25mg  BID. Continue Eliquis  BID for CHADSVASC of at least 2 (HTN, F sex).   Dyspnea: this has been ongoing and chronic. No change.   HTN: BP is uncontrolled. 190/78. Since I have gone down on verapamil for bradycardia, I will increase Lisinopril from  -->  daily and also add HCTZ 12.5mg  daily. Will see her  back in 2.5 weeks for BMET and BP follow up. If BP is better at that time she will schedule foot surgery.  Bipolar disorder: stable.   Morbid obesity: Body mass index is 45.31 kg/m. Working on diet and exercise. She has lost weight with the [employer]. Told her this was good as long as she is eating well balanced meals.    Medication Adjustments/Labs and Tests Ordered: Current medicines are reviewed at length with the patient today.  Concerns regarding medicines are outlined above.  Medication changes, Labs and Tests ordered today are listed in the Patient Instructions below. Patient Instructions  Medication Instructions:  Your physician has recommended you make the following change in your medication:  1.  DECREASE the Verapamil to 120 mg taking 1 tablet twice a day 2.  CHANGE the Lisinopril to 40 mg taking 1 tablet daily 3.  START Hydrochlorothiazide 12.5 mg taking 1 tablet daily  Labwork: None ordered  Testing/Procedures: None ordered  Follow-Up: Your physician recommends that you schedule a follow-up appointment in: 01/17/17 ARRIVE AT 7:50 FOR AN 8:00 APPOINTMENT WITH KATIE Artesia Berkey, PA-C   Any Other Special Instructions Will Be Listed Below (If Applicable).   If you need a refill on your cardiac medications before your next appointment, please call your pharmacy.      Signed, Cline Crock, PA-C  12/29/2016 9:37 AM    [facility] [address] [place], [place], [place]  [address] Phone: [phone]; Fax: [phone]

## 2016-12-29 ENCOUNTER — Ambulatory Visit (INDEPENDENT_AMBULATORY_CARE_PROVIDER_SITE_OTHER): Payer: PPO | Admitting: Physician Assistant

## 2016-12-29 ENCOUNTER — Encounter: Payer: Self-pay | Admitting: Physician Assistant

## 2016-12-29 VITALS — BP 190/78 | HR 53 | Ht 64.5 in | Wt 268.1 lb

## 2016-12-29 DIAGNOSIS — Z7901 Long term (current) use of anticoagulants: Secondary | ICD-10-CM | POA: Diagnosis not present

## 2016-12-29 DIAGNOSIS — I482 Chronic atrial fibrillation: Secondary | ICD-10-CM | POA: Diagnosis not present

## 2016-12-29 DIAGNOSIS — I10 Essential (primary) hypertension: Secondary | ICD-10-CM

## 2016-12-29 DIAGNOSIS — R0600 Dyspnea, unspecified: Secondary | ICD-10-CM | POA: Diagnosis not present

## 2016-12-29 DIAGNOSIS — I4821 Permanent atrial fibrillation: Secondary | ICD-10-CM

## 2016-12-29 MED ORDER — LISINOPRIL 40 MG PO TABS: 40.0000 mg | ORAL_TABLET | Freq: Every day | ORAL | 1 refills | Status: DC

## 2016-12-29 MED ORDER — HYDROCHLOROTHIAZIDE 12.5 MG PO CAPS: 12.5000 mg | ORAL_CAPSULE | Freq: Every day | ORAL | 1 refills | Status: DC

## 2016-12-29 MED ORDER — VERAPAMIL HCL ER 120 MG PO TBCR: 120.0000 mg | EXTENDED_RELEASE_TABLET | Freq: Two times a day (BID) | ORAL | 1 refills | Status: DC

## 2016-12-29 NOTE — Patient Instructions (Addendum)
Medication Instructions:  Your physician has recommended you make the following change in your medication:  1.  DECREASE the Verapamil to 120 mg taking 1 tablet twice a day 2.  CHANGE the Lisinopril to 40 mg taking 1 tablet daily 3.  START Hydrochlorothiazide 12.5 mg taking 1 tablet daily  Labwork: None ordered  Testing/Procedures: None ordered  Follow-Up: Your physician recommends that you schedule a follow-up appointment in: 01/17/17 ARRIVE AT 7:50 FOR AN 8:00 APPOINTMENT WITH [name], PA-C   Any Other Special Instructions Will Be Listed Below (If Applicable).   If you need a refill on your cardiac medications before your next appointment, please call your pharmacy.

## 2016-12-30 ENCOUNTER — Telehealth: Payer: Self-pay | Admitting: Cardiology

## 2016-12-30 DIAGNOSIS — Z79899 Other long term (current) drug therapy: Secondary | ICD-10-CM

## 2016-12-30 MED ORDER — HYDROCHLOROTHIAZIDE 25 MG PO TABS: 25.0000 mg | ORAL_TABLET | Freq: Every day | ORAL | 3 refills | Status: DC

## 2016-12-30 MED ORDER — LISINOPRIL 20 MG PO TABS: 20.0000 mg | ORAL_TABLET | Freq: Two times a day (BID) | ORAL | 11 refills | Status: DC

## 2016-12-30 MED ORDER — POTASSIUM CHLORIDE ER 10 MEQ PO TBCR: 10.0000 meq | EXTENDED_RELEASE_TABLET | Freq: Every day | ORAL | 3 refills | Status: DC

## 2016-12-30 NOTE — Telephone Encounter (Signed)
[name] is calling because she saw [name] on yesterday and she was under the impression that she was taking Lisinopril 10 mg twice a day , but actually she has been taking 20 mg twice a day .  [name] changed it to 40 mg's once a day . So she is wanting to know do she still need to make some adjustments to her blood pressure medication . Please call

## 2016-12-30 NOTE — Telephone Encounter (Signed)
Since we really haven't increased her Lisinopril (okay to keep on  daily), lets increase HCTZ from 12.5mg  daily to  daily and also add Kdur daily. We will need to check labs sooner than next appointment. Lets arrange a BMET next week

## 2017-01-05 DIAGNOSIS — F4310 Post-traumatic stress disorder, unspecified: Secondary | ICD-10-CM | POA: Diagnosis not present

## 2017-01-05 DIAGNOSIS — F411 Generalized anxiety disorder: Secondary | ICD-10-CM | POA: Diagnosis not present

## 2017-01-05 DIAGNOSIS — F319 Bipolar disorder, unspecified: Secondary | ICD-10-CM | POA: Diagnosis not present

## 2017-01-06 ENCOUNTER — Other Ambulatory Visit: Payer: PPO

## 2017-01-10 ENCOUNTER — Encounter (INDEPENDENT_AMBULATORY_CARE_PROVIDER_SITE_OTHER): Payer: Self-pay

## 2017-01-10 ENCOUNTER — Telehealth: Payer: Self-pay | Admitting: Physician Assistant

## 2017-01-10 ENCOUNTER — Other Ambulatory Visit: Payer: PPO

## 2017-01-10 ENCOUNTER — Ambulatory Visit (INDEPENDENT_AMBULATORY_CARE_PROVIDER_SITE_OTHER): Payer: PPO | Admitting: Physician Assistant

## 2017-01-10 ENCOUNTER — Encounter: Payer: Self-pay | Admitting: Physician Assistant

## 2017-01-10 VITALS — BP 160/100 | HR 130 | Ht 65.0 in | Wt 266.0 lb

## 2017-01-10 DIAGNOSIS — I480 Paroxysmal atrial fibrillation: Secondary | ICD-10-CM

## 2017-01-10 DIAGNOSIS — I10 Essential (primary) hypertension: Secondary | ICD-10-CM

## 2017-01-10 DIAGNOSIS — Z79899 Other long term (current) drug therapy: Secondary | ICD-10-CM | POA: Diagnosis not present

## 2017-01-10 LAB — BASIC METABOLIC PANEL
BUN/Creatinine Ratio: 19 (ref 12–28)
BUN: 21 mg/dL (ref 8–27)
CALCIUM: 9.7 mg/dL (ref 8.7–10.3)
CO2: 21 mmol/L (ref 18–29)
CREATININE: 1.11 mg/dL — AB (ref 0.57–1.00)
Chloride: 102 mmol/L (ref 96–106)
GFR, EST AFRICAN AMERICAN: 60 mL/min/{1.73_m2} (ref >59)
GFR, EST NON AFRICAN AMERICAN: 52 mL/min/{1.73_m2} — AB (ref >59)
Glucose: 110 mg/dL — ABNORMAL HIGH (ref 65–99)
POTASSIUM: 4.2 mmol/L (ref 3.5–5.2)
Sodium: 142 mmol/L (ref 134–144)

## 2017-01-10 MED ORDER — VERAPAMIL HCL ER 180 MG PO TBCR: 180.0000 mg | EXTENDED_RELEASE_TABLET | Freq: Two times a day (BID) | ORAL | Status: DC

## 2017-01-10 NOTE — Patient Instructions (Addendum)
Medication Instructions:  1. INCREASE VERAPAMIL TO 180 MG TWICE DAILY  Labwork: NONE ORDERED  Testing/Procedures: NONE ORDERED  Follow-Up: KEEP YOUR APPT WITH [name], [facility] 01/17/17  Any Other Special Instructions Will Be Listed Below (If Applicable). CALL IF YOUR SHORTNESS OF BREATH OR PALPITATIONS INCREASE  If you need a refill on your cardiac medications before your next appointment, please call your pharmacy.

## 2017-01-10 NOTE — Telephone Encounter (Signed)
New message     Per pt forgot to mention that she also took trintellix /had four dosages. Wanted to call and make Tereso Newcomer aware.

## 2017-01-10 NOTE — Telephone Encounter (Signed)
I will route this message to Tereso Newcomer, [facility] for his Lorain Childes

## 2017-01-10 NOTE — Progress Notes (Signed)
Cardiology Office Note:    Date:  01/10/2017   ID:  [name], DOB 26-Aug-1951, MRN [id number]  PCP:  Garlan Fillers, MD  Cardiologist:  Dr. Donato Schultz   Electrophysiologist:  n/a  Referring MD: Jarome Matin, MD   Chief Complaint  Patient presents with  . High Blood Pressure    History of Present Illness:    [name] is a 66 y.o. female with a hx of Persistent atrial fibrillation, bipolar disorder, Charcot foot, morbid obesity, GERD. She was last seen 12/29/16 by Cline Crock, PA-C.  She was in sinus rhythm. Heart rate was in the 50s and she did complain of some dizziness at times. Verapamil dose was reduced from 180 mg twice a day to 120 mg twice a day. Blood pressure was markedly elevated.  She was placed on HCTZ. She has follow-up 01/17/17.  However, she was in the office today for follow up labs.  She noted uncontrolled blood pressure to the RN and was added on for evaluation.    She has been concerned about her BP over the last few days.  It has been 160s/100s.  She has also had a high HR in the last day or so. She notes palpitations that are rapid.  She denies syncope or near syncope.  She does get lightheaded at times. She has had some atypical chest discomfort. She denies exertional chest pain. She has dyspnea with exertion that is fairly chronic. She denies any significant change. She denies orthopnea, PND or edema. Of note, she did suddenly stop Prozac 2-3 weeks ago after about 3 months of therapy.  Prior CV studies:   The following studies were reviewed today:  Myoview 9/16 EF 60, anterior and apical anterior defect-breast attenuation/cannot rule out infarct with mild peri-infarct ischemia, low risk study  Echo 3/16 EF 50-55, normal wall motion, mild MR, mild BAE  Carotid US 12/14 <40% bilateral ICA  Echo 5/13 Mild LVH, EF 65-70, grade 1 diastolic dysfunction, mild LAE  Myoview 4/11 No scar or ischemia  Past Medical History:  Diagnosis Date   . Abnormality of gait 10/02/2013  . Anxiety   . 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  PT ALSO HAS LOWER BACK PAIN AT TIMES  . Depression   . Dysrhythmia    PT STATES IRREGULAR HB FELT TO BE RELATED TO STRESS--NEGATIVE CARDIAC WORK UP IN 2011 WITH DR. Shirlee Latch [facility] --NUCLEAR STRESS TEST, ECHO AND HOLTER  MONITOR RESULTS IN EPIC   . GERD (gastroesophageal reflux disease)    RARE- OTC ANTACID IF NEEDED--PAST HX OF ULCER  . Headache    wakes up with headache   . Hyperlipidemia   . Hypertension   . Kidney stones    PT PASSED STONES--NO KNOWN STONES AT PRESENT TIMES  . Lumbago 10/02/2013  . Obesity   . Obesity   . OSA (obstructive sleep apnea) 12/30/2013   Very mild OSA with REM accentuation,  tested 11-29-13 at [facility] sleep, Dr Frances Furbish .   Marland Kitchen Periodic limb movement disorder (PLMD) 12/30/2013  . Restless leg syndrome   . Shortness of breath dyspnea   . Sleep apnea 02/10/2012  . Ulcer   . Urinary incontinence     Past Surgical History:  Procedure Laterality Date  . BILATERAL BUNIONECTOMY 1979    . BREAST SURGERY     biopsy   . COLON SURGERY    . SMALL INTESTINE SURGERY    . SURGERY FOR  BOWEL OBSTRUCTION AND REMOVAL OF PART OF STOMACH  IN 2000  2000  . TOTAL KNEE ARTHROPLASTY  02/16/2012   Procedure: TOTAL KNEE ARTHROPLASTY;  Surgeon: Javier Docker, MD;  Location: [facility];  Service: Orthopedics;  Laterality: Left;  . TOTAL KNEE ARTHROPLASTY Right 11/20/2014   Procedure: RIGHT TOTAL KNEE ARTHROPLASTY;  Surgeon: Javier Docker, MD;  Location: [facility];  Service: Orthopedics;  Laterality: Right;    Current Medications: Current Meds  Medication Sig  . Asenapine Maleate (SAPHRIS) 10 MG SUBL Place 10 mg under the tongue at bedtime.   Marland Kitchen buPROPion (WELLBUTRIN XL) 150 MG 24 hr tablet Take 150 mg by mouth every morning.  . carvedilol (COREG) 6.25 MG tablet Take 6.25 mg by mouth 2 (two) times daily with a meal.  . cyanocobalamin (,VITAMIN B-12,) 1000 MCG/ML injection  Inject 1,000 mcg into the muscle every 30 (thirty) days.  Marland Kitchen ELIQUIS 5 MG TABS tablet Take 5 mg by mouth 2 (two) times daily.  Marland Kitchen gabapentin (NEURONTIN) 600 MG tablet Take 600 mg by mouth 4 (four) times daily.  . hydrochlorothiazide (HYDRODIURIL) 25 MG tablet Take 1 tablet (25 mg total) by mouth daily.  . iron polysaccharides (NIFEREX) 150 MG capsule Take 150 mg by mouth daily.  Marland Kitchen lisinopril (PRINIVIL,ZESTRIL) 40 MG tablet Take 40 mg by mouth daily.   . meclizine (ANTIVERT) 25 MG tablet Take 25 mg by mouth 3 (three) times daily as needed for dizziness.  . Multiple Vitamins-Minerals (MULTIVITAMIN & MINERAL PO) Take 1 tablet by mouth daily.  . potassium chloride (K-DUR) 10 MEQ tablet Take 1 tablet (10 mEq total) by mouth daily.  . [DISCONTINUED] verapamil (CALAN-SR) 120 MG CR tablet Take 1 tablet (120 mg total) by mouth 2 (two) times daily.     Allergies:   Zolpidem tartrate and Amoxicillin   Social History   Social History  . Marital status: Single    Spouse name: N/A  . Number of children: 0  . Years of education: 51   Social History Main Topics  . Smoking status: Former Smoker    Packs/day: 2.00    Years: 20.00    Types: Cigarettes    Quit date: 10/17/1988  . Smokeless tobacco: Never Used  . Alcohol use No  . Drug use: No     Comment: Marijuana - 25 years ago  . Sexual activity: Not Asked   Other Topics Concern  . None   Social History Narrative   Patient is single and lives alone.   Patient is disabled.   Patient has a college education.   Patient is right-handed.   Patient drinks two cokes per day.     Family History  Problem Relation Age of Onset  . Sleep apnea Father   . Multiple sclerosis Sister   . Multiple sclerosis Sister      ROS:   Please see the history of present illness.    ROS All other systems reviewed and are negative.   EKGs/Labs/Other Test Reviewed:    EKG:  EKG is  ordered today.  The ekg ordered today demonstrates Atrial fibrillation, HR  127, normal axis  Recent Labs: No results found for requested labs within last 8760 hours.   Recent Lipid Panel No results found for: CHOL, TRIG, HDL, CHOLHDL, VLDL, LDLCALC, LDLDIRECT   Physical Exam:    VS:  BP (!) 160/100   Pulse (!) 130   Ht  (1.651 m)   Wt 266 lb (120.7 kg)  LMP  (LMP Unknown)   BMI 44.26 kg/m     Wt Readings from Last 3 Encounters:  01/10/17 266 lb (120.7 kg)  12/29/16 268 lb 1.9 oz (121.6 kg)  10/28/16 296 lb (134.3 kg)     Physical Exam  Constitutional: She is oriented to person, place, and time. She appears well-developed and well-nourished. No distress.  HENT:  Head: Normocephalic and atraumatic.  Eyes: No scleral icterus.  Neck: Normal range of motion. No JVD present.  Cardiovascular: S1 normal, S2 normal and normal heart sounds.  An irregularly irregular rhythm present. Tachycardia present.   No murmur heard. Pulmonary/Chest: Breath sounds normal. She has no wheezes. She has no rhonchi. She has no rales.  Abdominal: Soft. There is no tenderness.  Musculoskeletal: She exhibits no edema.  Neurological: She is alert and oriented to person, place, and time.  Skin: Skin is warm and dry.  Psychiatric: She has a normal mood and affect.    ASSESSMENT:    1. Essential hypertension   2. PAF (paroxysmal atrial fibrillation) (HCC)   3. Morbid obesity (HCC)    PLAN:    In order of problems listed above:  1. Essential hypertension -  She has uncontrolled blood pressure. Her machine at home has likely been in accurately obtaining her blood pressure the last couple of days secondary to her AF with RVR. Blood pressure on her home cuff today was 151/120.  Our reading was 160/100 on our manual cuff. Her blood pressure may have gone up in response to stopping her Prozac without tapering. She is now on high-dose ACE inhibitor along with calcium channel blocker, thiazide diuretic and beta blocker. We discussed adjusting her beta blocker versus going  back to her old dose calcium channel blocker. She does tell me that she was quite lethargic on higher dose of carvedilol in the past. She also needs better rate control with her atrial fibrillation.  -  Increase verapamil to 180 mg twice a day  -  Keep follow-up next week with Cline Crock, PA-C   -  Consider adding Hydralazine or Clonidine at follow up if BP not at target  -  Consider renal arterial US to r/o RAS if BP remains high  2. PAF (paroxysmal atrial fibrillation) (HCC) -  She is back in AF with RVR today.  She is mildly symptomatic.  No congestive heart failure signs or symptoms.  She did have better HRs and was in NSR on higher dose Verapamil.  She has had some lightheadedness in the past.  When she saw Florentina Addison, she was in sinus brady.    -  Increase Verapamil back to 180 bid  -  Call if she feels worse/or go to the ED  -  Keep FU next week.  -  Consider event monitor to assess rhythm further (r/o pauses, AF burden, etc)  -  Consider referral to EP for management of AF  3. Morbid obesity (HCC) - She is trying to lose weight.   Dispo:  Return in about 7 days (around 01/17/2017) for Scheduled Follow Up with Carlean Jews, PA-C .   Medication Adjustments/Labs and Tests Ordered: Current medicines are reviewed at length with the patient today.  Concerns regarding medicines are outlined above.  Medication changes, Labs and Tests ordered today are outlined in the Patient Instructions noted below. Patient Instructions  Medication Instructions:  1. INCREASE VERAPAMIL TO 180 MG TWICE DAILY  Labwork: NONE ORDERED  Testing/Procedures: NONE ORDERED  Follow-Up: KEEP YOUR APPT  WITH Carlean Jews, [facility] 01/17/17  Any Other Special Instructions Will Be Listed Below (If Applicable). CALL IF YOUR SHORTNESS OF BREATH OR PALPITATIONS INCREASE  If you need a refill on your cardiac medications before your next appointment, please call your pharmacy.  Signed, Tereso Newcomer, PA-C    01/10/2017 1:08 PM    [facility] [address] [place], [place], [place]  [address] Phone: [phone]; Fax: [phone]

## 2017-01-11 ENCOUNTER — Ambulatory Visit: Payer: PPO | Admitting: Physician Assistant

## 2017-01-12 NOTE — Progress Notes (Signed)
Cardiology Office Note    Date:  01/17/2017   ID:  [name], DOB 1951-02-08, MRN [id number]  PCP:  Garlan Fillers, MD  Cardiologist:  Dr. Anne Fu  CC: follow up   History of Present Illness:  [name] is a 66 y.o. female with a history of persistent afib, bipolar disorder, charcot foot, morbid obesity, HTN, GERD who presents to clinic for follow up of uncontrolled HTN and PAF.   She has had a history of dyspnea. Echo performed in 11/2014 showed normal LVEF, mild MR, mild biatrial enlargement. Nuclear stress test 05/2016 was low risk. Dyspnea felt related to morbid obesity and deconditioning.   She was last seen by Dr. Anne Fu in 10/2016. She was cleared for orthopedic foot surgery. Verapamil was increased from  -->  BID for better HR/BP control. She had a previous allergy to lopressor (hair loss).   I saw her in clinic on 12/29/16 and she was in sinus bradycardia with HR 53. Also her BP was uncontrolled 190/78. She complained of feeling dizzy and just not right. I decided to decrease Verapamil  BID to  BID. I continued her on Lisinopril  daily and Coreg 6.25mg  BID and added HCTZ  daily.   She came back for labs last week and was added on to [facility] PA-[facility] schedule for evaluation of uncontrolled BPs and afib with RVR. Diltiazem was increased back to  BID. She also reported stopping prozac abruptly after 3 months, which may have contributed.   Today she presents to clinic for follow up. No CP. She continues to have chronic exertional SOB. No LE edema, orthopnea or PND. No dizziness or syncope. No blood in stool or urine. She occasionally feels her heart racing. Review of HR and BPs are good except for 4/22 she had a drop of SBPs into 70-90s. She did feel a little weak that day. Fortunately, BP has improved since then.    Past Medical History:  Diagnosis Date  . Abnormality of gait 10/02/2013  . Anxiety   . Arthritis    OA AND PAIN IN BOTH  KNEES-LEFT WORSE.  PT ALSO HAS LOWER BACK PAIN AT TIMES  . Depression   . Dysrhythmia    PT STATES IRREGULAR HB FELT TO BE RELATED TO STRESS--NEGATIVE CARDIAC WORK UP IN 2011 WITH DR. Shirlee Latch [facility] --NUCLEAR STRESS TEST, ECHO AND HOLTER  MONITOR RESULTS IN EPIC   . GERD (gastroesophageal reflux disease)    RARE- OTC ANTACID IF NEEDED--PAST HX OF ULCER  . Headache    wakes up with headache   . Hyperlipidemia   . Hypertension   . Kidney stones    PT PASSED STONES--NO KNOWN STONES AT PRESENT TIMES  . Lumbago 10/02/2013  . Obesity   . Obesity   . OSA (obstructive sleep apnea) 12/30/2013   Very mild OSA with REM accentuation,  tested 11-29-13 at [facility] sleep, Dr Frances Furbish .   Marland Kitchen Periodic limb movement disorder (PLMD) 12/30/2013  . Restless leg syndrome   . Shortness of breath dyspnea   . Sleep apnea 02/10/2012  . Ulcer   . Urinary incontinence     Past Surgical History:  Procedure Laterality Date  . BILATERAL BUNIONECTOMY 1979    . BREAST SURGERY     biopsy   . COLON SURGERY    . SMALL INTESTINE SURGERY    . SURGERY FOR BOWEL OBSTRUCTION AND REMOVAL OF PART OF STOMACH  IN 2000  2000  . TOTAL KNEE  ARTHROPLASTY  02/16/2012   Procedure: TOTAL KNEE ARTHROPLASTY;  Surgeon: Javier Docker, MD;  Location: [facility];  Service: Orthopedics;  Laterality: Left;  . TOTAL KNEE ARTHROPLASTY Right 11/20/2014   Procedure: RIGHT TOTAL KNEE ARTHROPLASTY;  Surgeon: Javier Docker, MD;  Location: [facility];  Service: Orthopedics;  Laterality: Right;    Current Medications: Outpatient Medications Prior to Visit  Medication Sig Dispense Refill  . Asenapine Maleate (SAPHRIS) 10 MG SUBL Place 10 mg under the tongue at bedtime.     Marland Kitchen buPROPion (WELLBUTRIN XL) 150 MG 24 hr tablet Take 150 mg by mouth every morning.    . carvedilol (COREG) 6.25 MG tablet Take 6.25 mg by mouth 2 (two) times daily with a meal.    . cyanocobalamin (,VITAMIN B-12,) 1000 MCG/ML injection Inject 1,000 mcg into the muscle every 30  (thirty) days.    Marland Kitchen ELIQUIS 5 MG TABS tablet Take 5 mg by mouth 2 (two) times daily.    Marland Kitchen gabapentin (NEURONTIN) 600 MG tablet Take 600 mg by mouth 4 (four) times daily.    . hydrochlorothiazide (HYDRODIURIL) 25 MG tablet Take 1 tablet (25 mg total) by mouth daily. 90 tablet 3  . iron polysaccharides (NIFEREX) 150 MG capsule Take 150 mg by mouth daily.    Marland Kitchen lisinopril (PRINIVIL,ZESTRIL) 40 MG tablet Take 40 mg by mouth daily.     . meclizine (ANTIVERT) 25 MG tablet Take 25 mg by mouth 3 (three) times daily as needed for dizziness.    . Multiple Vitamins-Minerals (MULTIVITAMIN & MINERAL PO) Take 1 tablet by mouth daily.    . potassium chloride (K-DUR) 10 MEQ tablet Take 1 tablet (10 mEq total) by mouth daily. 90 tablet 3  . verapamil (CALAN-SR) 180 MG CR tablet Take 1 tablet (180 mg total) by mouth 2 (two) times daily.    Marland Kitchen vortioxetine HBr (TRINTELLIX) 10 MG TABS Take 10 mg by mouth daily.     No facility-administered medications prior to visit.      Allergies:   Zolpidem tartrate and Amoxicillin   Social History   Social History  . Marital status: Single    Spouse name: N/A  . Number of children: 0  . Years of education: 32   Social History Main Topics  . Smoking status: Former Smoker    Packs/day: 2.00    Years: 20.00    Types: Cigarettes    Quit date: 10/17/1988  . Smokeless tobacco: Never Used  . Alcohol use No  . Drug use: No     Comment: Marijuana - 25 years ago  . Sexual activity: Not Asked   Other Topics Concern  . None   Social History Narrative   Patient is single and lives alone.   Patient is disabled.   Patient has a college education.   Patient is right-handed.   Patient drinks two cokes per day.     Family History:  The patient's family history includes Multiple sclerosis in her sister and sister; Sleep apnea in her father.      ROS:   Please see the history of present illness.    ROS All other systems reviewed and are negative.   PHYSICAL EXAM:     VS:  BP 132/84   Pulse 72   Ht  (1.651 m)   Wt 266 lb (120.7 kg)   LMP  (LMP Unknown)   SpO2 99%   BMI 44.26 kg/m    GEN: Well nourished, well developed, in no  acute distress, morbidly obese  HEENT: normal  Neck: no JVD, carotid bruits, or masses Cardiac: irreg irreg; no murmurs, rubs, or gallops,no edema in left LE, right leg in brace. Respiratory:  clear to auscultation bilaterally, normal work of breathing GI: soft, nontender, nondistended, + BS MS: no deformity or atrophy  Skin: warm and dry, no rash Neuro:  Alert and Oriented x 3, Strength and sensation are intact Psych: euthymic mood, full affect    Wt Readings from Last 3 Encounters:  01/17/17 266 lb (120.7 kg)  01/10/17 266 lb (120.7 kg)  12/29/16 268 lb 1.9 oz (121.6 kg)      Studies/Labs Reviewed:   EKG:  EKG is ordered today.  The ekg ordered today demonstrates atrial fibrillation with slow VR HR 57  Recent Labs: 01/10/2017: BUN 21; Creatinine, Ser 1.11; Potassium 4.2; Sodium 142   Lipid Panel No results found for: CHOL, TRIG, HDL, CHOLHDL, VLDL, LDLCALC, LDLDIRECT  Additional studies/ records that were reviewed today include:  ECHO:11/25/14 - Left ventricle: The cavity size was normal. Systolic function wasnormal. The estimated ejection fraction was in the range of 50%to 55%. Wall motion was normal; there were no regional wall motion abnormalities. - Mitral valve: There was mild regurgitation. - Left atrium: The atrium was mildly dilated. - Right atrium: The atrium was mildly dilated.   Nuclear stress test 06/23/15  Nuclear stress EF: 60%.  There was no ST segment deviation noted during stress.  Defect 1: There is a small defect of mild severity present in the mid anterior and apical anterior location. This is likely breast attenuation, but cannot rule out infarct with mild peri-infarct ischemia.  The study is normal.  This is a low risk study.  The left ventricular ejection fraction  is normal (55-65%).   Carotid US 12/14 <40% bilateral ICA   ASSESSMENT & PLAN:   Paroxysmal atrial fibrillation: previously felt to persistent but noted to be in sinus brady (HR 52) on 12/29/16. She was feeling a little weak and dizzy, so verapamil 180 mg BID decreased to  BID. But then she returned the next week in atrial fib with RVR and hypertensive so verapamil was increased back to  BID. Today she is still in atrial fibrillation but with controlled ventricular response. She is feeling better and BP is in normal range so I will continue current regimen: Eliquis  BID (CHADSVASC of at least 67 (age, F sex, age)), Coreg 6.25mg  BID and verapamil  BID.  I think she is in and out of afib, but currently she is feeling good, so I will just continue to monitor at this time. Will bring her back in 2-3 weeks to review BP/HR log. If everything remains stable, she will be cleared for Charcot foot surgery.  Essential HTN: BP much better controlled today.   Dyspnea: chronic and stable.  Bipolar disorder: stable  Morbid obesity: Body mass index is 44.26 kg/m. Working on diet and exercise    Medication Adjustments/Labs and Tests Ordered: Current medicines are reviewed at length with the patient today.  Concerns regarding medicines are outlined above.  Medication changes, Labs and Tests ordered today are listed in the Patient Instructions below. Patient Instructions  Medication Instructions:  Your physician recommends that you continue on your current medications as directed. Please refer to the Current Medication list given to you today.   Labwork: None ordered  Testing/Procedures: None ordered  Follow-Up: Your physician recommends that you schedule a follow-up appointment in: 02/06/17 ARRIVE AT 8:15 TO  SEE [name], PA-C   Any Other Special Instructions Will Be Listed Below (If Applicable).    If you need a refill on your cardiac medications before your next  appointment, please call your pharmacy.     Signed, Cline Crock, PA-C  01/17/2017 8:52 AM    [facility] [address] [place], [place], [place]  [address] Phone: [phone]; Fax: [phone]

## 2017-01-13 DIAGNOSIS — L97529 Non-pressure chronic ulcer of other part of left foot with unspecified severity: Secondary | ICD-10-CM | POA: Diagnosis not present

## 2017-01-13 DIAGNOSIS — F418 Other specified anxiety disorders: Secondary | ICD-10-CM | POA: Diagnosis not present

## 2017-01-13 DIAGNOSIS — I10 Essential (primary) hypertension: Secondary | ICD-10-CM | POA: Diagnosis not present

## 2017-01-13 DIAGNOSIS — G608 Other hereditary and idiopathic neuropathies: Secondary | ICD-10-CM | POA: Diagnosis not present

## 2017-01-13 DIAGNOSIS — I480 Paroxysmal atrial fibrillation: Secondary | ICD-10-CM | POA: Diagnosis not present

## 2017-01-13 DIAGNOSIS — R300 Dysuria: Secondary | ICD-10-CM | POA: Diagnosis not present

## 2017-01-13 DIAGNOSIS — R7309 Other abnormal glucose: Secondary | ICD-10-CM | POA: Diagnosis not present

## 2017-01-13 DIAGNOSIS — D6489 Other specified anemias: Secondary | ICD-10-CM | POA: Diagnosis not present

## 2017-01-13 DIAGNOSIS — F329 Major depressive disorder, single episode, unspecified: Secondary | ICD-10-CM | POA: Diagnosis not present

## 2017-01-13 DIAGNOSIS — Z6841 Body Mass Index (BMI) 40.0 and over, adult: Secondary | ICD-10-CM | POA: Diagnosis not present

## 2017-01-17 ENCOUNTER — Encounter (INDEPENDENT_AMBULATORY_CARE_PROVIDER_SITE_OTHER): Payer: Self-pay

## 2017-01-17 ENCOUNTER — Ambulatory Visit (INDEPENDENT_AMBULATORY_CARE_PROVIDER_SITE_OTHER): Payer: PPO | Admitting: Physician Assistant

## 2017-01-17 ENCOUNTER — Encounter: Payer: Self-pay | Admitting: Physician Assistant

## 2017-01-17 VITALS — BP 132/84 | HR 72 | Ht 65.0 in | Wt 266.0 lb

## 2017-01-17 DIAGNOSIS — R0600 Dyspnea, unspecified: Secondary | ICD-10-CM | POA: Diagnosis not present

## 2017-01-17 DIAGNOSIS — I10 Essential (primary) hypertension: Secondary | ICD-10-CM | POA: Diagnosis not present

## 2017-01-17 DIAGNOSIS — I480 Paroxysmal atrial fibrillation: Secondary | ICD-10-CM | POA: Diagnosis not present

## 2017-01-17 NOTE — Patient Instructions (Addendum)
Medication Instructions:  Your physician recommends that you continue on your current medications as directed. Please refer to the Current Medication list given to you today.   Labwork: None ordered  Testing/Procedures: None ordered  Follow-Up: Your physician recommends that you schedule a follow-up appointment in: 02/06/17 ARRIVE AT 8:15 TO SEE [name], PA-C   Any Other Special Instructions Will Be Listed Below (If Applicable).    If you need a refill on your cardiac medications before your next appointment, please call your pharmacy.

## 2017-02-02 ENCOUNTER — Encounter: Payer: Self-pay | Admitting: Physician Assistant

## 2017-02-03 NOTE — Progress Notes (Signed)
Cardiology Office Note    Date:  02/06/2017   ID:  [name], DOB Sep 08, 1951, MRN [id number]  PCP:  Jarome Matin, MD  Cardiologist: Dr. Anne Fu  CC: follow up BP and PAF  History of Present Illness:  [name] is a 66 y.o. female with a history of persistent afib, bipolar disorder, charcot foot, morbid obesity, HTN, GERD who presents to clinic for follow up of uncontrolled HTN and PAF.   She has had a history of dyspnea. Echo performed in 11/2014 showednormal LVEF, mild MR, mild biatrial enlargement. Nuclear stress test 05/2016 was low risk. Dyspnea felt related to morbid obesity and deconditioning.   She was last seen by Dr. Anne Fu in 10/2016. She was cleared for orthopedic foot surgery. Verapamil was increased from 120mg  -->180mg  BID for better HR/BP control. She had a previous allergy to lopressor (hair loss).   I saw her in clinic on 12/29/16 and she was in sinus bradycardia with HR 53. Also her BP was uncontrolled 190/78. She complained of feeling dizzy and just not right. I decreased Verapamil 180mg  BID to 120mg  BID. I continued her on Lisinopril 40mg  daily and Coreg 6.25mg  BID and added HCTZ 25mg  daily.   She came back for labs 4/17 and was added on to [facility] PA-C schedule for evaluation of uncontrolled BPs and afib with RVR. Diltiazem was increased back to 180mg  BID. She also reported stopping prozac abruptly after 3 months, which may have contributed to high BPs.  I saw her back on 4/24 and she remained in afib with CVR and BP was controlled. Plan was to keep meds same and make sure BP and HR stable before arranging for her charcot foot surgery.   Today she presents to clinic for follow up. She is overall feeling quite well but concerned that her HR is running so low. HR   43-51 bpm at home. She feels tired all the time with no energy. She thinks this gets worse when her HRs are low. She continues to have dizziness that just comes on while sitting. She has  an appointment with an ENT soon. No CP, but she had chronic SOB. No LE edema, orthopnea or PND. No syncope. No blood in stool or urine. No palpitations. She would like to put off Charcot foot surgery until she is totally stable.      Past Medical History:  Diagnosis Date  . Abnormality of gait 10/02/2013  . Anxiety   . 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  PT ALSO HAS LOWER BACK PAIN AT TIMES  . Depression   . Dysrhythmia    PT STATES IRREGULAR HB FELT TO BE RELATED TO STRESS--NEGATIVE CARDIAC WORK UP IN 2011 WITH DR. Shirlee Latch [facility] --NUCLEAR STRESS TEST, ECHO AND HOLTER  MONITOR RESULTS IN EPIC   . GERD (gastroesophageal reflux disease)    RARE- OTC ANTACID IF NEEDED--PAST HX OF ULCER  . Headache    wakes up with headache   . Hyperlipidemia   . Hypertension   . Kidney stones    PT PASSED STONES--NO KNOWN STONES AT PRESENT TIMES  . Lumbago 10/02/2013  . Obesity   . Obesity   . OSA (obstructive sleep apnea) 12/30/2013   Very mild OSA with REM accentuation,  tested 11-29-13 at [facility] sleep, Dr Frances Furbish .   Marland Kitchen Periodic limb movement disorder (PLMD) 12/30/2013  . Restless leg syndrome   . Shortness of breath dyspnea   .  Sleep apnea 02/10/2012  . Ulcer   . Urinary incontinence     Past Surgical History:  Procedure Laterality Date  . BILATERAL BUNIONECTOMY 1979    . BREAST SURGERY     biopsy   . COLON SURGERY    . SMALL INTESTINE SURGERY    . SURGERY FOR BOWEL OBSTRUCTION AND REMOVAL OF PART OF STOMACH  IN 2000  2000  . TOTAL KNEE ARTHROPLASTY  02/16/2012   Procedure: TOTAL KNEE ARTHROPLASTY;  Surgeon: Javier Docker, MD;  Location: [facility];  Service: Orthopedics;  Laterality: Left;  . TOTAL KNEE ARTHROPLASTY Right 11/20/2014   Procedure: RIGHT TOTAL KNEE ARTHROPLASTY;  Surgeon: Javier Docker, MD;  Location: [facility];  Service: Orthopedics;  Laterality: Right;    Current Medications: Outpatient Medications Prior to Visit  Medication Sig Dispense Refill  .  Asenapine Maleate (SAPHRIS) 10 MG SUBL Place 10 mg under the tongue at bedtime.     Marland Kitchen buPROPion (WELLBUTRIN XL) 150 MG 24 hr tablet Take 150 mg by mouth every morning.    . carvedilol (COREG) 6.25 MG tablet Take 6.25 mg by mouth 2 (two) times daily with a meal.    . cyanocobalamin (,VITAMIN B-12,) 1000 MCG/ML injection Inject 1,000 mcg into the muscle every 30 (thirty) days.    Marland Kitchen ELIQUIS 5 MG TABS tablet Take 5 mg by mouth 2 (two) times daily.    Marland Kitchen gabapentin (NEURONTIN) 600 MG tablet Take 600 mg by mouth 4 (four) times daily.    . hydrochlorothiazide (HYDRODIURIL) 25 MG tablet Take 1 tablet (25 mg total) by mouth daily. 90 tablet 3  . iron polysaccharides (NIFEREX) 150 MG capsule Take 150 mg by mouth daily.    Marland Kitchen lisinopril (PRINIVIL,ZESTRIL) 40 MG tablet Take 40 mg by mouth daily.     . meclizine (ANTIVERT) 25 MG tablet Take 25 mg by mouth 3 (three) times daily as needed for dizziness.    . Multiple Vitamins-Minerals (MULTIVITAMIN & MINERAL PO) Take 1 tablet by mouth daily.    . potassium chloride (K-DUR) 10 MEQ tablet Take 1 tablet (10 mEq total) by mouth daily. 90 tablet 3  . verapamil (CALAN-SR) 180 MG CR tablet Take 1 tablet (180 mg total) by mouth 2 (two) times daily.    Marland Kitchen vortioxetine HBr (TRINTELLIX) 10 MG TABS Take 10 mg by mouth daily.     No facility-administered medications prior to visit.      Allergies:   Zolpidem tartrate and Amoxicillin   Social History   Social History  . Marital status: Single    Spouse name: N/A  . Number of children: 0  . Years of education: 43   Social History Main Topics  . Smoking status: Former Smoker    Packs/day: 2.00    Years: 20.00    Types: Cigarettes    Quit date: 10/17/1988  . Smokeless tobacco: Never Used  . Alcohol use No  . Drug use: No     Comment: Marijuana - 25 years ago  . Sexual activity: Not Asked   Other Topics Concern  . None   Social History Narrative   Patient is single and lives alone.   Patient is disabled.    Patient has a college education.   Patient is right-handed.   Patient drinks two cokes per day.     Family History:  The patient's family history includes Multiple sclerosis in her sister and sister; Sleep apnea in her father.     ROS:  Please see the history of present illness.    ROS All other systems reviewed and are negative.   PHYSICAL EXAM:   VS:  BP 138/64   Pulse (!) 51   Ht 5\' 5"  (1.651 m)   Wt 258 lb 9.6 oz (117.3 kg)   LMP  (LMP Unknown)   BMI 43.03 kg/m    GEN: Well nourished, well developed, in no acute distress, obese HEENT: normal  Neck: no JVD, carotid bruits, or masses Cardiac: RR, brady; no murmurs, rubs, or gallops,no edema  Respiratory:  clear to auscultation bilaterally, normal work of breathing GI: soft, nontender, nondistended, + BS MS: no deformity or atrophy  Skin: warm and dry, no rash Neuro:  Alert and Oriented x 3, Strength and sensation are intact Psych: euthymic mood, full affect    Wt Readings from Last 3 Encounters:  02/06/17 258 lb 9.6 oz (117.3 kg)  01/17/17 266 lb (120.7 kg)  01/10/17 266 lb (120.7 kg)      Studies/Labs Reviewed:   EKG:  EKG is ordered today.  The ekg ordered today demonstrates sinus bradycardia, HR 51  Recent Labs: 01/10/2017: BUN 21; Creatinine, Ser 1.11; Potassium 4.2; Sodium 142   Lipid Panel No results found for: CHOL, TRIG, HDL, CHOLHDL, VLDL, LDLCALC, LDLDIRECT  Additional studies/ records that were reviewed today include:  ECHO:11/25/14 - Left ventricle: The cavity size was normal. Systolic function wasnormal. The estimated ejection fraction was in the range of 50%to 55%. Wall motion was normal; there were no regional wall motion abnormalities. - Mitral valve: There was mild regurgitation. - Left atrium: The atrium was mildly dilated. - Right atrium: The atrium was mildly dilated.   Nuclear stress test 06/23/15  Nuclear stress EF: 60%.  There was no ST segment deviation noted during  stress.  Defect 1: There is a small defect of mild severity present in the mid anterior and apical anterior location. This is likely breast attenuation, but cannot rule out infarct with mild peri-infarct ischemia.  The study is normal.  This is a low risk study.  The left ventricular ejection fraction is normal (55-65%).   Carotid US 12/14 <40% bilateral ICA    ASSESSMENT & PLAN:   Paroxysmal atrial fibrillation with tachy-brady: She is in and out of afib. She has had low HRs when in sinus and  Verapamil dose was decreased from 180mg  BID to 120mg  BID . She then had afib with RVR on 01/10/17 and Verapamil dose was resumed at 180mg BID. She is asymptomatic when her afib is rate controlled. ECG today shows sinus brady with HR 51. She brings in a log of BPs and HRs. BPs have been well controlled. She is concerned that HRs are in the 40s (down to 43 at home). She feels tired all the time. She thinks she feels worse when HRs are low. I discussed case with Dr. Johney Frame who thinks that a rhythm control strategy would be appropriate, possibly Tikosyn. No indication for pacemaker placement at this time. She also noted that her mother had one and she is not interested.   HTN: BP has been well controlled   Dyspnea: this is chronic and stable.   Bipolar disorder: stable  Morbid obesity: Body mass index is 43.03 kg/m. She is looking into a weight loss study drug.   Dizziness: occurs when just sitting. Likely vestibular. She is scheduled to see an ENT.  Medication Adjustments/Labs and Tests Ordered: Current medicines are reviewed at length with the patient today.  Concerns  regarding medicines are outlined above.  Medication changes, Labs and Tests ordered today are listed in the Patient Instructions below. Patient Instructions  Medication Instructions:  Your physician recommends that you continue on your current medications as directed. Please refer to the Current Medication list given to you  today.   Labwork: None ordered  Testing/Procedures: None ordered  Follow-Up: Your physician recommends that you schedule a follow-up appointment in: THIS Wednesday, 02/08/17, ARRIVE AT 3:15 FOR A 3:30 APPT WITH DR. Ladona RidgelAYLOR  Any Other Special Instructions Will Be Listed Below (If Applicable).    If you need a refill on your cardiac medications before your next appointment, please call your pharmacy.      Signed, Cline CrockKathryn Jakevion Arney, PA-C  02/06/2017 10:16 AM    [facility] [address], [place], [place]  [address] Phone: [phone]; Fax: [phone]

## 2017-02-06 ENCOUNTER — Encounter (INDEPENDENT_AMBULATORY_CARE_PROVIDER_SITE_OTHER): Payer: Self-pay

## 2017-02-06 ENCOUNTER — Encounter: Payer: Self-pay | Admitting: Physician Assistant

## 2017-02-06 ENCOUNTER — Ambulatory Visit (INDEPENDENT_AMBULATORY_CARE_PROVIDER_SITE_OTHER): Payer: PPO | Admitting: Physician Assistant

## 2017-02-06 VITALS — BP 138/64 | HR 51 | Ht 65.0 in | Wt 258.6 lb

## 2017-02-06 DIAGNOSIS — I495 Sick sinus syndrome: Secondary | ICD-10-CM | POA: Diagnosis not present

## 2017-02-06 DIAGNOSIS — Z79899 Other long term (current) drug therapy: Secondary | ICD-10-CM | POA: Diagnosis not present

## 2017-02-06 DIAGNOSIS — I480 Paroxysmal atrial fibrillation: Secondary | ICD-10-CM

## 2017-02-06 DIAGNOSIS — R0600 Dyspnea, unspecified: Secondary | ICD-10-CM

## 2017-02-06 DIAGNOSIS — I10 Essential (primary) hypertension: Secondary | ICD-10-CM | POA: Diagnosis not present

## 2017-02-06 NOTE — Patient Instructions (Addendum)
Medication Instructions:  Your physician recommends that you continue on your current medications as directed. Please refer to the Current Medication list given to you today.   Labwork: None ordered  Testing/Procedures: None ordered  Follow-Up: Your physician recommends that you schedule a follow-up appointment in: THIS Wednesday, 02/08/17, ARRIVE AT 3:15 FOR A 3:30 APPT WITH DR. Ladona RidgelAYLOR  Any Other Special Instructions Will Be Listed Below (If Applicable).    If you need a refill on your cardiac medications before your next appointment, please call your pharmacy.

## 2017-02-07 DIAGNOSIS — F411 Generalized anxiety disorder: Secondary | ICD-10-CM | POA: Diagnosis not present

## 2017-02-07 DIAGNOSIS — F319 Bipolar disorder, unspecified: Secondary | ICD-10-CM | POA: Diagnosis not present

## 2017-02-07 DIAGNOSIS — F4310 Post-traumatic stress disorder, unspecified: Secondary | ICD-10-CM | POA: Diagnosis not present

## 2017-02-08 ENCOUNTER — Telehealth: Payer: Self-pay | Admitting: *Deleted

## 2017-02-08 NOTE — Telephone Encounter (Signed)
Left message for pt to let her know as long as she is feeling ok, and not having any dizziness or syncope, that she can wait to see Dr. Ladona Ridgelaylor Tuesday, 02/14/17.

## 2017-02-14 ENCOUNTER — Encounter: Payer: Self-pay | Admitting: Internal Medicine

## 2017-02-14 ENCOUNTER — Ambulatory Visit (INDEPENDENT_AMBULATORY_CARE_PROVIDER_SITE_OTHER): Payer: PPO | Admitting: Internal Medicine

## 2017-02-14 ENCOUNTER — Telehealth: Payer: Self-pay | Admitting: Pharmacist

## 2017-02-14 VITALS — BP 150/68 | HR 66 | Ht 64.75 in | Wt 265.0 lb

## 2017-02-14 DIAGNOSIS — I480 Paroxysmal atrial fibrillation: Secondary | ICD-10-CM | POA: Diagnosis not present

## 2017-02-14 DIAGNOSIS — I10 Essential (primary) hypertension: Secondary | ICD-10-CM

## 2017-02-14 DIAGNOSIS — I495 Sick sinus syndrome: Secondary | ICD-10-CM

## 2017-02-14 NOTE — Telephone Encounter (Signed)
Medication list review prior to Tikosyn initiation. Pt anticoagulated appropriately with apixaban 5mg  BID, will need to ensure she has not missed any doses within 3 weeks of Tikosyn start. Pt not on any other AAD previously. Multiple drug interactions observed. As acknowledged by Dr. Lubertha Basqueaylor's note, pt will need to discontinue hydrochlorothiazide and verapamil at least two days prior to initiation, these are contraindicated category X risk. Pt also takes the antipsychotic asenapine that is listed as a major interaction, with risk of QTc prolongation. Literature review reports a few case reports of incidence being similar to that of quetiapine (~8-10%). One report showed an mean increase in QTc of 35.6 ms (9.1%). Ideally would prefer pt to not take asenapine, however realize that treatment options for her biopolar are most likely limited and this may be unreasonable. Most antipsychotics carry a fairly substantial risk for QT prolongation. Last QTc was 425ms on 02/06/17. If she continues on asenapine, will need very close QTc monitoring and follow up.

## 2017-02-14 NOTE — Progress Notes (Signed)
HPI Ms. Carreira is referred today by Carlean Jews, PA-C for evaluation of atrial fibrillation in the setting of HTN heart disease and morbid obesity. She has had symptomatic atrial fib for over a year which has been paroxysmal at times and persistent at times. She has had RVR and a slow VR and with medical therapy has also had sinus bradycardia. She has never had syncope. She does have problems with venous insufficiency and is prone to developing ulcers on her legs, especially on the left. The patient has had difficulty losing weight. 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  Allergies  Allergen Reactions  . Zolpidem Tartrate Other (See Comments)    hallucinations  . Amoxicillin Rash     Current Outpatient Prescriptions  Medication Sig Dispense Refill  . asenapine (SAPHRIS) 5 MG SUBL 24 hr tablet Place 5 mg under the tongue at bedtime.    Marland Kitchen buPROPion (WELLBUTRIN XL) 150 MG 24 hr tablet Take 150 mg by mouth every morning.    . carvedilol (COREG) 6.25 MG tablet Take 6.25 mg by mouth 2 (two) times daily with a meal.    . cyanocobalamin (,VITAMIN B-12,) 1000 MCG/ML injection Inject 1,000 mcg into the muscle every 30 (thirty) days.    Marland Kitchen ELIQUIS 5 MG TABS tablet Take 5 mg by mouth 2 (two) times daily.    Marland Kitchen gabapentin (NEURONTIN) 600 MG tablet Take 600 mg by mouth 4 (four) times daily.    . hydrochlorothiazide (HYDRODIURIL) 25 MG tablet Take 1 tablet (25 mg total) by mouth daily. 90 tablet 3  . iron polysaccharides (NIFEREX) 150 MG capsule Take 150 mg by mouth daily.    Marland Kitchen lisinopril (PRINIVIL,ZESTRIL) 40 MG tablet Take 40 mg by mouth daily.     . meclizine (ANTIVERT) 25 MG tablet Take 25 mg by mouth 3 (three) times daily as needed for dizziness.    . Multiple Vitamins-Minerals (MULTIVITAMIN & MINERAL PO) Take 1 tablet by mouth daily.    . potassium chloride (K-DUR) 10 MEQ tablet Take 1 tablet (10 mEq total) by mouth daily. 90 tablet 3  . verapamil (CALAN-SR) 180 MG CR tablet Take 1  tablet (180 mg total) by mouth 2 (two) times daily.    Marland Kitchen vortioxetine HBr (TRINTELLIX) 10 MG TABS Take 10 mg by mouth daily.     No current facility-administered medications for this visit.      Past Medical History:  Diagnosis Date  . Abnormality of gait 10/02/2013  . Anxiety   . 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  PT ALSO HAS LOWER BACK PAIN AT TIMES  . Depression   . Dysrhythmia    PT STATES IRREGULAR HB FELT TO BE RELATED TO STRESS--NEGATIVE CARDIAC WORK UP IN 2011 WITH DR. Shirlee Latch [facility] --NUCLEAR STRESS TEST, ECHO AND HOLTER  MONITOR RESULTS IN EPIC   . GERD (gastroesophageal reflux disease)    RARE- OTC ANTACID IF NEEDED--PAST HX OF ULCER  . Headache    wakes up with headache   . Hyperlipidemia   . Hypertension   . Kidney stones    PT PASSED STONES--NO KNOWN STONES AT PRESENT TIMES  . Lumbago 10/02/2013  . Obesity   . Obesity   . OSA (obstructive sleep apnea) 12/30/2013   Very mild OSA with REM accentuation,  tested 11-29-13 at [facility] sleep, Dr Frances Furbish .   Marland Kitchen Periodic limb movement disorder (PLMD) 12/30/2013  . Restless leg syndrome   .  Shortness of breath dyspnea   . Sleep apnea 02/10/2012  . Ulcer   . Urinary incontinence     ROS:   All systems reviewed and negative except as noted in the HPI.   Past Surgical History:  Procedure Laterality Date  . BILATERAL BUNIONECTOMY 1979    . BREAST SURGERY     biopsy   . COLON SURGERY    . SMALL INTESTINE SURGERY    . SURGERY FOR BOWEL OBSTRUCTION AND REMOVAL OF PART OF STOMACH  IN 2000  2000  . TOTAL KNEE ARTHROPLASTY  02/16/2012   Procedure: TOTAL KNEE ARTHROPLASTY;  Surgeon: Javier DockerJeffrey C Beane, MD;  Location: [facility];  Service: Orthopedics;  Laterality: Left;  . TOTAL KNEE ARTHROPLASTY Right 11/20/2014   Procedure: RIGHT TOTAL KNEE ARTHROPLASTY;  Surgeon: Javier DockerJeffrey C Beane, MD;  Location: [facility];  Service: Orthopedics;  Laterality: Right;     Family History  Problem Relation Age of Onset  . Sleep  apnea Father   . Multiple sclerosis Sister   . Multiple sclerosis Sister      Social History   Social History  . Marital status: Single    Spouse name: N/A  . Number of children: 0  . Years of education: 14   Occupational History  . Not on file.   Social History Main Topics  . Smoking status: Former Smoker    Packs/day: 2.00    Years: 20.00    Types: Cigarettes    Quit date: 10/17/1988  . Smokeless tobacco: Never Used  . Alcohol use No  . Drug use: No     Comment: Marijuana - 25 years ago  . Sexual activity: Not on file   Other Topics Concern  . Not on file   Social History Narrative   Patient is single and lives alone.   Patient is disabled.   Patient has a college education.   Patient is right-handed.   Patient drinks two cokes per day.     BP (!) 150/68   Pulse 66   Ht 5' 4.75" (1.645 m)   Wt 265 lb (120.2 kg)   LMP  (LMP Unknown)   SpO2 98%   BMI 44.44 kg/m   Physical Exam:  Morbidly obese appearing 66 yo woman, NAD HEENT: Unremarkable Neck:  6 cm JVD, no thyromegally Lymphatics:  No adenopathy Back:  No CVA tenderness Lungs:  Clear with no wheezes HEART:  Regular rate and rhythm, no murmurs, no rubs, no clicks Abd:  soft, obese, positive bowel sounds, no organomegally, no rebound, no guarding Ext:  2 plus pulses, no edema, no cyanosis, no clubbing Skin:  No rashes no nodules Neuro:  CN II through XII intact, motor grossly intact  EKG - reviewed - sinus bradycardia  Assess/Plan: 1. Atrial fib with a RVR - I have discussed the treatment options with the patient and recommended initiation of dofetilide. We will schedule in the next few weeks. She is encouraged not to miss her anti-coagulation. 2. Morbid obesity - I have encouraged her to lose weight and told her of the importance of weight loss in terms of maintaining NSR. 3. HTN heart disesase - she has had very difficult to control blood pressures. I suspect she will need her coreg uptitrated as  she will stop the verapamil and the HCTZ as both of these meds are not compatible with dofetilide. She will need to stop these meds 2 days before inpatient initiation.  4. Venous insufficiency - she has a healing  ulcer in her left leg. She will continue her current therapy.  Leonia Reeves.D.

## 2017-02-14 NOTE — Patient Instructions (Addendum)
Medication Instructions:  Your physician recommends that you continue on your current medications as directed. Please refer to the Current Medication list given to you today.   Labwork: None Ordered   Testing/Procedures: Tikosyn Admission  Someone will call you to schedule from the [facility] to schedule the hospital admission. Should be scheduled on a Monday or Tuesday when Dr. Ladona Ridgelaylor is at the hospital.   Possible Dates: 5/29, 6/5, 6/12, 6/18, 6/26   Follow-Up: Your physician recommends that you schedule a follow-up appointment in: 4-6 weeks after admission with Dr. Ladona Ridgelaylor    Any Other Special Instructions Will Be Listed Below (If Applicable).     If you need a refill on your cardiac medications before your next appointment, please call your pharmacy.

## 2017-02-17 ENCOUNTER — Telehealth (HOSPITAL_COMMUNITY): Payer: Self-pay | Admitting: *Deleted

## 2017-02-17 NOTE — Telephone Encounter (Signed)
LM 5/24  for patient to return my call to schedule tikosyn admission

## 2017-02-17 NOTE — Telephone Encounter (Signed)
-----   Message from Gwenlyn Sarananya R Cooke, LPN sent at 4/09/81195/22/2018 10:26 AM EDT ----- Regarding: Tikosyn Admission Please contact pt to set up Tikosyn Admission. Dr. Ladona Ridgelaylor would like it scheduled on a day he is at the hospital. Possible upcoming dates: 5/29, 6/5, 6/12, 6/18, 6/26.   Dr. Ladona Ridgelaylor would like pt to STOP Verapamil and HCTZ 2 days prior to admission.  F/u 1 week after admission with labs and EKG  Thanks, Boykin Peekenae Cooke, LPN

## 2017-02-22 ENCOUNTER — Institutional Professional Consult (permissible substitution): Payer: PPO | Admitting: Internal Medicine

## 2017-02-22 NOTE — Telephone Encounter (Signed)
LMOM for pt to rtn call re: Tikosyn admission

## 2017-02-23 ENCOUNTER — Encounter (HOSPITAL_COMMUNITY): Payer: Self-pay | Admitting: *Deleted

## 2017-02-24 NOTE — Telephone Encounter (Signed)
Pt returned call to set up tikosyn admission. She will be checking on price of drug and speaking with her primary physician in regards to substitution for asenapine (SAPHRIS) 5 MG SUBL 24 hr tablet as she cannot come off this medication without a replacement as she has been on it for a very long time. Pt will call me back once she has these two pieces of information to determine when and if she will go through with tikosyn admission.

## 2017-03-02 NOTE — Telephone Encounter (Signed)
Pt called with update -- stated tikosyn will be affordable for her but she wants to talk with her therapist in regards to Asenapine prior to committing to Tikosyn. Her therapist is out of town until end of week. She will call back when she talks with therapist with their recommendations.

## 2017-03-07 ENCOUNTER — Telehealth: Payer: Self-pay | Admitting: Internal Medicine

## 2017-03-07 ENCOUNTER — Telehealth (HOSPITAL_COMMUNITY): Payer: Self-pay | Admitting: *Deleted

## 2017-03-07 NOTE — Telephone Encounter (Signed)
New message      Talk to the nurse regarding upcoming hosp visit for drug load due to afib.  Please call tomorrow

## 2017-03-07 NOTE — Telephone Encounter (Signed)
Pt called to let us know that she has discussed her Saphris with her therapist and she is weening off this medication and will stop it on Thursday.  They have no intention of replacing it.  Pt also stated that she checked with insurance and that the copay for the drug would be $12.  She scheduled for 03/14/2017 at 10 a.m. And was given directions

## 2017-03-09 NOTE — Telephone Encounter (Signed)
tikosyn load has been reviewed with pt. Hospital admission preauth for insurance in process.

## 2017-03-14 ENCOUNTER — Inpatient Hospital Stay (HOSPITAL_COMMUNITY)
Admission: AD | Admit: 2017-03-14 | Discharge: 2017-03-17 | DRG: 309 | Disposition: A | Discharge status: Home / Self Care | Payer: PPO | Source: Ambulatory Visit | Attending: Internal Medicine | Admitting: Internal Medicine

## 2017-03-14 ENCOUNTER — Encounter (HOSPITAL_COMMUNITY): Payer: Self-pay

## 2017-03-14 ENCOUNTER — Encounter (HOSPITAL_COMMUNITY): Payer: Self-pay | Admitting: Nurse Practitioner

## 2017-03-14 ENCOUNTER — Ambulatory Visit (HOSPITAL_COMMUNITY)
Admission: RE | Admit: 2017-03-14 | Discharge: 2017-03-14 | Disposition: A | Discharge status: Home / Self Care | Payer: PPO | Source: Ambulatory Visit | Attending: Nurse Practitioner | Admitting: Nurse Practitioner

## 2017-03-14 VITALS — BP 142/80 | HR 56 | Ht 64.75 in | Wt 268.0 lb

## 2017-03-14 DIAGNOSIS — Z87891 Personal history of nicotine dependence: Secondary | ICD-10-CM

## 2017-03-14 DIAGNOSIS — E669 Obesity, unspecified: Secondary | ICD-10-CM | POA: Insufficient documentation

## 2017-03-14 DIAGNOSIS — E785 Hyperlipidemia, unspecified: Secondary | ICD-10-CM | POA: Insufficient documentation

## 2017-03-14 DIAGNOSIS — G2581 Restless legs syndrome: Secondary | ICD-10-CM | POA: Diagnosis present

## 2017-03-14 DIAGNOSIS — Z6841 Body Mass Index (BMI) 40.0 and over, adult: Secondary | ICD-10-CM

## 2017-03-14 DIAGNOSIS — Z880 Allergy status to penicillin: Secondary | ICD-10-CM | POA: Diagnosis not present

## 2017-03-14 DIAGNOSIS — M1990 Unspecified osteoarthritis, unspecified site: Secondary | ICD-10-CM | POA: Diagnosis present

## 2017-03-14 DIAGNOSIS — K219 Gastro-esophageal reflux disease without esophagitis: Secondary | ICD-10-CM | POA: Insufficient documentation

## 2017-03-14 DIAGNOSIS — F329 Major depressive disorder, single episode, unspecified: Secondary | ICD-10-CM | POA: Insufficient documentation

## 2017-03-14 DIAGNOSIS — I10 Essential (primary) hypertension: Secondary | ICD-10-CM

## 2017-03-14 DIAGNOSIS — Z7901 Long term (current) use of anticoagulants: Secondary | ICD-10-CM

## 2017-03-14 DIAGNOSIS — Z96653 Presence of artificial knee joint, bilateral: Secondary | ICD-10-CM | POA: Diagnosis not present

## 2017-03-14 DIAGNOSIS — G4733 Obstructive sleep apnea (adult) (pediatric): Secondary | ICD-10-CM | POA: Insufficient documentation

## 2017-03-14 DIAGNOSIS — Z87442 Personal history of urinary calculi: Secondary | ICD-10-CM

## 2017-03-14 DIAGNOSIS — Z79899 Other long term (current) drug therapy: Secondary | ICD-10-CM | POA: Diagnosis not present

## 2017-03-14 DIAGNOSIS — F419 Anxiety disorder, unspecified: Secondary | ICD-10-CM

## 2017-03-14 DIAGNOSIS — R51 Headache: Secondary | ICD-10-CM | POA: Diagnosis not present

## 2017-03-14 DIAGNOSIS — Z888 Allergy status to other drugs, medicaments and biological substances status: Secondary | ICD-10-CM

## 2017-03-14 DIAGNOSIS — Z881 Allergy status to other antibiotic agents status: Secondary | ICD-10-CM

## 2017-03-14 DIAGNOSIS — I480 Paroxysmal atrial fibrillation: Secondary | ICD-10-CM

## 2017-03-14 DIAGNOSIS — Z5181 Encounter for therapeutic drug level monitoring: Secondary | ICD-10-CM | POA: Diagnosis not present

## 2017-03-14 LAB — BASIC METABOLIC PANEL
Anion gap: 11 (ref 5–15)
BUN: 17 mg/dL (ref 6–20)
CO2: 23 mmol/L (ref 22–32)
Calcium: 9.3 mg/dL (ref 8.9–10.3)
Chloride: 106 mmol/L (ref 101–111)
Creatinine, Ser: 1.19 mg/dL — ABNORMAL HIGH (ref 0.44–1.00)
GFR calc Af Amer: 54 mL/min — ABNORMAL LOW (ref >60)
GFR calc non Af Amer: 47 mL/min — ABNORMAL LOW (ref >60)
Glucose, Bld: 112 mg/dL — ABNORMAL HIGH (ref 65–99)
Potassium: 4.1 mmol/L (ref 3.5–5.1)
Sodium: 140 mmol/L (ref 135–145)

## 2017-03-14 LAB — MAGNESIUM: Magnesium: 1.8 mg/dL (ref 1.7–2.4)

## 2017-03-14 MED ORDER — CYANOCOBALAMIN 1000 MCG/ML IJ SOLN: 1000.0000 ug | INTRAMUSCULAR | Status: DC

## 2017-03-14 MED ORDER — CARVEDILOL 6.25 MG PO TABS
6.2500 mg | ORAL_TABLET | Freq: Two times a day (BID) | ORAL | Status: DC
  Administered 2017-03-14 – 2017-03-15 (×3): 6.25 mg via ORAL
  Filled 2017-03-14 (×4): qty 1

## 2017-03-14 MED ORDER — SODIUM CHLORIDE 0.9% FLUSH
3.0000 mL | Freq: Two times a day (BID) | INTRAVENOUS | Status: DC
Start: 2017-03-14 — End: 2017-03-17
  Administered 2017-03-14 – 2017-03-17 (×6): 3 mL via INTRAVENOUS

## 2017-03-14 MED ORDER — APIXABAN 5 MG PO TABS
5.0000 mg | ORAL_TABLET | Freq: Two times a day (BID) | ORAL | Status: DC
  Administered 2017-03-14 – 2017-03-17 (×6): 5 mg via ORAL
  Filled 2017-03-14 (×6): qty 1

## 2017-03-14 MED ORDER — VORTIOXETINE HBR 10 MG PO TABS
10.0000 mg | ORAL_TABLET | Freq: Every day | ORAL | Status: DC
  Administered 2017-03-15 – 2017-03-17 (×3): 10 mg via ORAL
  Filled 2017-03-14 (×3): qty 1

## 2017-03-14 MED ORDER — DOFETILIDE 500 MCG PO CAPS
500.0000 ug | ORAL_CAPSULE | Freq: Two times a day (BID) | ORAL | Status: DC
  Administered 2017-03-14 – 2017-03-17 (×6): 500 ug via ORAL
  Filled 2017-03-14 (×6): qty 1

## 2017-03-14 MED ORDER — POLYSACCHARIDE IRON COMPLEX 150 MG PO CAPS
150.0000 mg | ORAL_CAPSULE | Freq: Every day | ORAL | Status: DC
  Administered 2017-03-15 – 2017-03-17 (×3): 150 mg via ORAL
  Filled 2017-03-14 (×3): qty 1

## 2017-03-14 MED ORDER — POTASSIUM CHLORIDE ER 10 MEQ PO TBCR
10.0000 meq | EXTENDED_RELEASE_TABLET | Freq: Every day | ORAL | Status: DC
  Administered 2017-03-15 – 2017-03-17 (×3): 10 meq via ORAL
  Filled 2017-03-14 (×6): qty 1

## 2017-03-14 MED ORDER — MAGNESIUM SULFATE 2 GM/50ML IV SOLN
2.0000 g | Freq: Once | INTRAVENOUS | Status: AC
  Administered 2017-03-14: 2 g via INTRAVENOUS
  Filled 2017-03-14: qty 50

## 2017-03-14 MED ORDER — GABAPENTIN 600 MG PO TABS
600.0000 mg | ORAL_TABLET | Freq: Four times a day (QID) | ORAL | Status: DC
  Administered 2017-03-14 – 2017-03-17 (×13): 600 mg via ORAL
  Filled 2017-03-14 (×13): qty 1

## 2017-03-14 MED ORDER — BUPROPION HCL ER (XL) 150 MG PO TB24
150.0000 mg | ORAL_TABLET | Freq: Every morning | ORAL | Status: DC
  Administered 2017-03-15 – 2017-03-17 (×3): 150 mg via ORAL
  Filled 2017-03-14 (×3): qty 1

## 2017-03-14 MED ORDER — HYDRALAZINE HCL 20 MG/ML IJ SOLN
10.0000 mg | Freq: Four times a day (QID) | INTRAMUSCULAR | Status: DC | PRN
  Administered 2017-03-15 – 2017-03-16 (×2): 10 mg via INTRAVENOUS
  Filled 2017-03-14 (×3): qty 1

## 2017-03-14 MED ORDER — SODIUM CHLORIDE 0.9 % IV SOLN
250.0000 mL | INTRAVENOUS | Status: DC | PRN
  Administered 2017-03-14: 250 mL via INTRAVENOUS

## 2017-03-14 MED ORDER — MECLIZINE HCL 25 MG PO TABS: 25.0000 mg | ORAL_TABLET | Freq: Three times a day (TID) | ORAL | Status: DC | PRN

## 2017-03-14 MED ORDER — LISINOPRIL 40 MG PO TABS
40.0000 mg | ORAL_TABLET | Freq: Every day | ORAL | Status: DC
  Administered 2017-03-15 – 2017-03-17 (×3): 40 mg via ORAL
  Filled 2017-03-14 (×3): qty 1

## 2017-03-14 MED ORDER — SODIUM CHLORIDE 0.9% FLUSH: 3.0000 mL | INTRAVENOUS | Status: DC | PRN

## 2017-03-14 NOTE — Addendum Note (Signed)
Encounter addended by: Hillis RangeAllred, Cheryle Dark, MD on: 03/14/2017  2:19 PM<BR>    Actions taken: Sign clinical note, Problem List reviewed

## 2017-03-14 NOTE — Progress Notes (Addendum)
Primary Care Physician: Jarome Matin, MD Referring Physician: Dr. Jeanella Cara I Frith is a 66 y.o. female with a h/o paroxysmal afib that is in the afib clinic for tikosyn administration. PharmD screened drugs and recommendedstopping HCTZ and verapamil, stopped as of Saturday. Asenapine was also recommended to stop and was weaned off by PCP and last dose was last Thursday.No missed doses of eliquis. She has had symptomatic afib for over a year.  Today, she denies symptoms of palpitations, chest pain, shortness of breath, orthopnea, PND, lower extremity edema, dizziness, presyncope, syncope, or neurologic sequela. The patient is tolerating medications without difficulties and is otherwise without complaint today.   Past Medical History:  Diagnosis Date  . Abnormality of gait 10/02/2013  . Anxiety   . 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  PT ALSO HAS LOWER BACK PAIN AT TIMES  . Depression   . Dysrhythmia    PT STATES IRREGULAR HB FELT TO BE RELATED TO STRESS--NEGATIVE CARDIAC WORK UP IN 2011 WITH DR. Shirlee Latch [facility] --NUCLEAR STRESS TEST, ECHO AND HOLTER  MONITOR RESULTS IN EPIC   . GERD (gastroesophageal reflux disease)    RARE- OTC ANTACID IF NEEDED--PAST HX OF ULCER  . Headache    wakes up with headache   . Hyperlipidemia   . Hypertension   . Kidney stones    PT PASSED STONES--NO KNOWN STONES AT PRESENT TIMES  . Lumbago 10/02/2013  . Obesity   . Obesity   . OSA (obstructive sleep apnea) 12/30/2013   Very mild OSA with REM accentuation,  tested 11-29-13 at [facility] sleep, Dr Frances Furbish .   Marland Kitchen Periodic limb movement disorder (PLMD) 12/30/2013  . Restless leg syndrome   . Shortness of breath dyspnea   . Sleep apnea 02/10/2012  . Ulcer   . Urinary incontinence    Past Surgical History:  Procedure Laterality Date  . BILATERAL BUNIONECTOMY 1979    . BREAST SURGERY     biopsy   . COLON SURGERY    . SMALL INTESTINE SURGERY    . SURGERY FOR BOWEL  OBSTRUCTION AND REMOVAL OF PART OF STOMACH  IN 2000  2000  . TOTAL KNEE ARTHROPLASTY  02/16/2012   Procedure: TOTAL KNEE ARTHROPLASTY;  Surgeon: Javier Docker, MD;  Location: [facility];  Service: Orthopedics;  Laterality: Left;  . TOTAL KNEE ARTHROPLASTY Right 11/20/2014   Procedure: RIGHT TOTAL KNEE ARTHROPLASTY;  Surgeon: Javier Docker, MD;  Location: [facility];  Service: Orthopedics;  Laterality: Right;    No current facility-administered medications for this encounter.    No current outpatient prescriptions on file.   Facility-Administered Medications Ordered in Other Encounters  Medication Dose Route Frequency Provider Last Rate Last Dose  . 0.9 %  sodium chloride infusion  250 mL Intravenous PRN Gypsy Balsam K, NP      . apixaban (ELIQUIS) tablet 5 mg  5 mg Oral BID Marily Lente, NP      . Melene Muller ON 03/15/2017] buPROPion (WELLBUTRIN XL) 24 hr tablet 150 mg  150 mg Oral q morning - 10a Seiler, [facility] K, NP      . carvedilol (COREG) tablet 6.25 mg  6.25 mg Oral BID WC Seiler, Amber K, NP      . dofetilide (TIKOSYN) capsule 500 mcg  500 mcg Oral BID Gypsy Balsam K, NP      . gabapentin (NEURONTIN) tablet 600 mg  600 mg Oral QID Marily Lente, NP      . [  START ON 03/15/2017] iron polysaccharides (NIFEREX) capsule 150 mg  150 mg Oral Daily Gypsy BalsamSeiler, Amber K, NP      . Melene Muller[START ON 03/15/2017] lisinopril (PRINIVIL,ZESTRIL) tablet 40 mg  40 mg Oral Daily [facility] K, NP      . magnesium sulfate IVPB 2 g 50 mL  2 g Intravenous Once Glory BuffSeiler, [facility] K, NP      . meclizine (ANTIVERT) tablet 25 mg  25 mg Oral TID PRN Marily LenteSeiler, Amber K, NP      . Melene Muller[START ON 03/15/2017] potassium chloride (K-DUR) CR tablet 10 mEq  10 mEq Oral Daily Seiler, Amber K, NP      . sodium chloride flush (NS) 0.9 % injection 3 mL  3 mL Intravenous Q12H [facility] K, NP      . sodium chloride flush (NS) 0.9 % injection 3 mL  3 mL Intravenous PRN Gypsy BalsamSeiler, Amber K, NP      . Melene Muller[START ON 03/15/2017] vortioxetine HBr (TRINTELLIX) TABS  10 mg  10 mg Oral Daily Marily LenteSeiler, Amber K, NP        Allergies  Allergen Reactions  . Zolpidem Tartrate Other (See Comments)    hallucinations  . Amoxicillin Rash    Social History   Social History  . Marital status: Single    Spouse name: N/A  . Number of children: 0  . Years of education: 14   Occupational History  . Not on file.   Social History Main Topics  . Smoking status: Former Smoker    Packs/day: 2.00    Years: 20.00    Types: Cigarettes    Quit date: 10/17/1988  . Smokeless tobacco: Never Used  . Alcohol use No  . Drug use: No     Comment: Marijuana - 25 years ago  . Sexual activity: Not on file   Other Topics Concern  . Not on file   Social History Narrative   Patient is single and lives alone.   Patient is disabled.   Patient has a college education.   Patient is right-handed.   Patient drinks two cokes per day.    Family History  Problem Relation Age of Onset  . Sleep apnea Father   . Multiple sclerosis Sister   . Multiple sclerosis Sister     ROS- All systems are reviewed and negative except as per the HPI above  Physical Exam: Vitals:   03/14/17 0932  BP: (!) 142/80  Pulse: (!) 56  Weight: 268 lb (121.6 kg)  Height: 5' 4.75" (1.645 m)   Wt Readings from Last 3 Encounters:  03/14/17 268 lb (121.6 kg)  02/14/17 265 lb (120.2 kg)  02/06/17 258 lb 9.6 oz (117.3 kg)    Labs: Lab Results  Component Value Date   NA 140 03/14/2017   K 4.1 03/14/2017   CL 106 03/14/2017   CO2 23 03/14/2017   GLUCOSE 112 (H) 03/14/2017   BUN 17 03/14/2017   CREATININE 1.19 (H) 03/14/2017   CALCIUM 9.3 03/14/2017   MG 1.8 03/14/2017   Lab Results  Component Value Date   INR 0.95 11/13/2014   No results found for: CHOL, HDL, LDLCALC, TRIG   GEN- The patient is well appearing, alert and oriented x 3 today.   Head- normocephalic, atraumatic Eyes-  Sclera clear, conjunctiva pink Ears- hearing intact Oropharynx- clear Neck- supple, no  JVP Lymph- no cervical lymphadenopathy Lungs- Clear to ausculation bilaterally, normal work of breathing Heart- Regular rate and rhythm, no murmurs, rubs  or gallops, PMI not laterally displaced GI- soft, NT, ND, + BS Extremities- no clubbing, cyanosis, or edema MS- no significant deformity or atrophy Skin- no rash or lesion Psych- euthymic mood, full affect Neuro- strength and sensation are intact  EKG-Sinus brady at 56 bpm, pr int 154 ms, qrs int 86 ms, qtc 407 ms Epic records reviewed    Assessment and Plan: 1. Paroxysmal afib  Admit for tikosyn administration Off asenapine, hctz and verapamil(see HPI) for dates Labs acceptable with k+ in range at 4.1, mag at 1.8 and expect replacement before first dose this pm Crcl cal at 90.45 No missed doses of eliquis 5 mg bid with a chadsvasc score of at least 3 States can afford drug General education re precautions of tikosyn   Elvina Sidle. [facility] Fol[facility] [address] [place], [place] [address] [phone]   I have seen, examined the patient, and reviewed the above assessment and plan. On exam, RRR.  Overweight.  Changes to above are made where necessary.   Will admit for initiation of tikosyn.  Co Sign: Hillis Range, MD 03/14/2017 2:19 PM

## 2017-03-14 NOTE — Progress Notes (Signed)
Pharmacy Review for Dofetilide (Tikosyn) Initiation  Admit Complaint: 66 y.o. female admitted 03/14/2017 with atrial fibrillation to be initiated on dofetilide.   Assessment:  Patient Exclusion Criteria: If any screening criteria checked as "Yes", then  patient  should NOT receive dofetilide until criteria item is corrected. If "Yes" please indicate correction plan.  YES  NO Patient  Exclusion Criteria Correction Plan  []  [x]  Baseline QTc interval is greater than or equal to 440 msec. IF above YES box checked dofetilide contraindicated unless patient has ICD; then may proceed if QTc 500-550 msec or with known ventricular conduction abnormalities may proceed with QTc 550-600 msec. QTc =  450   []  [x]  Magnesium level is less than 1.8 mEq/l : Last magnesium:  Lab Results  Component Value Date   MG 1.8 03/14/2017       Replacing  []  [x]  Potassium level is less than 4 mEq/l : Last potassium:  Lab Results  Component Value Date   K 4.1 03/14/2017       On scheduled potassium  []  [x]  Patient is known or suspected to have a digoxin level greater than 2 ng/ml: No results found for: DIGOXIN    []  [x]  Creatinine clearance less than 20 ml/min (calculated using Cockcroft-Gault, actual body weight and serum creatinine): Estimated Creatinine Clearance: 61.4 mL/min (A) (by C-G formula based on SCr of 1.19 mg/dL (H)).    []  [x]  Patient has received drugs known to prolong the QT intervals within the last 48 hours (phenothiazines, tricyclics or tetracyclic antidepressants, erythromycin, H-1 antihistamines, cisapride, fluoroquinolones, azithromycin). Drugs not listed above may have an, as yet, undetected potential to prolong the QT interval, updated information on QT prolonging agents is available at this website:QT prolonging agents   []  [x]  Patient received a dose of hydrochlorothiazide (Oretic) alone or in any combination including triamterene (Dyazide, Maxzide) in the last 48 hours.   []  [x]  Patient  received a medication known to increase dofetilide plasma concentrations prior to initial dofetilide dose:  . Trimethoprim (Primsol, Proloprim) in the last 36 hours . Verapamil (Calan, Verelan) in the last 36 hours or a sustained release dose in the last 72 hours . Megestrol (Megace) in the last 5 days  . Cimetidine (Tagamet) in the last 6 hours . Ketoconazole (Nizoral) in the last 24 hours . Itraconazole (Sporanox) in the last 48 hours  . Prochlorperazine (Compazine) in the last 36 hours    []  [x]  Patient is known to have a history of torsades de pointes; congenital or acquired long QT syndromes.   []  [x]  Patient has received a Class 1 antiarrhythmic with less than 2 half-lives since last dose. (Disopyramide, Quinidine, Procainamide, Lidocaine, Mexiletine, Flecainide, Propafenone)   []  [x]  Patient has received amiodarone therapy in the past 3 months or amiodarone level is greater than 0.3 ng/ml.    Patient has been appropriately anticoagulated with apixaban.  Ordering provider was confirmed at [email or web address] if they are not listed on the [facility] Authorized Prescribers list.  Goal of Therapy: Follow renal function, electrolytes, potential drug interactions, and dose adjustment. Provide education and 1 week supply at discharge.  Plan:  [x]   Physician selected initial dose within range recommended for patients level of renal function - will monitor for response.  []   Physician selected initial dose outside of range recommended for patients level of renal function - will discuss if the dose should be altered at this time.   Select One Calculated CrCl  Dose q12h  [x]  > 60  ml/min 500 mcg  []MpoChdmvhxy$  40-60 ml/min 250 mcg  []  20-40 ml/min 125 mcg   2. Follow up QTc after the first 5 doses, renal function, electrolytes (K & Mg) daily x 3     days, dose adjustment, success of initiation and facilitate 1 week discharge supply as     clinically indicated.  3. Initiate Tikosyn education video (Call  [phone] and ask for Tikosyn Video # 116).  4. Place Enrollment Form on the chart for discharge supply of dofetilide.   Babs BertinHaley Lorijean Husser, PharmD, BCPS Clinical Pharmacist Rx Phone # for today: [phone] After 3:30PM, please call Main Rx: [phone] 03/14/2017 11:53 AM

## 2017-03-14 NOTE — Progress Notes (Signed)
Per insurance check for Tikosyn # 6.. [facility] [phone]   1. TIKOSYN 500 MCG BIS   COVER- NONE FORMULARY  PRIOR APPROVAL- YES # [id number]    2. DOFETILIDE 5000 MCG BID   COVER- YES  CO-PAY- $ 12.00  TIER- 2 DRUG  PRIOR APPROVAL   RETAIL OR MAIL-ORDER FOR 90 DAYS SUPPLY $ 24.00   PHARMACY : [facility] AND [facility]

## 2017-03-14 NOTE — H&P (Signed)
See AF clinic note from today which is H&P 

## 2017-03-15 LAB — BASIC METABOLIC PANEL
ANION GAP: 10 (ref 5–15)
BUN: 17 mg/dL (ref 6–20)
CO2: 23 mmol/L (ref 22–32)
Calcium: 8.8 mg/dL — ABNORMAL LOW (ref 8.9–10.3)
Chloride: 106 mmol/L (ref 101–111)
Creatinine, Ser: 1.29 mg/dL — ABNORMAL HIGH (ref 0.44–1.00)
GFR calc Af Amer: 49 mL/min — ABNORMAL LOW (ref >60)
GFR, EST NON AFRICAN AMERICAN: 42 mL/min — AB (ref >60)
Glucose, Bld: 132 mg/dL — ABNORMAL HIGH (ref 65–99)
POTASSIUM: 3.7 mmol/L (ref 3.5–5.1)
SODIUM: 139 mmol/L (ref 135–145)

## 2017-03-15 LAB — MAGNESIUM: MAGNESIUM: 2.2 mg/dL (ref 1.7–2.4)

## 2017-03-15 MED ORDER — ACETAMINOPHEN 325 MG PO TABS
650.0000 mg | ORAL_TABLET | ORAL | Status: DC | PRN
  Administered 2017-03-15 – 2017-03-17 (×3): 650 mg via ORAL
  Filled 2017-03-15 (×3): qty 2

## 2017-03-15 MED ORDER — POTASSIUM CHLORIDE CRYS ER 20 MEQ PO TBCR
20.0000 meq | EXTENDED_RELEASE_TABLET | Freq: Once | ORAL | Status: AC
  Administered 2017-03-15: 20 meq via ORAL
  Filled 2017-03-15: qty 1

## 2017-03-15 MED ORDER — TRAMADOL HCL 50 MG PO TABS
50.0000 mg | ORAL_TABLET | Freq: Four times a day (QID) | ORAL | Status: DC | PRN
  Administered 2017-03-16: 50 mg via ORAL
  Filled 2017-03-15: qty 1

## 2017-03-15 NOTE — Progress Notes (Signed)
Electrophysiology Rounding Note  Patient Name: [name] Date of Encounter: 03/15/2017  Electrophysiologist: Ladona Ridgel   Subjective   The patient is doing well today.  At this time, the patient denies chest pain, shortness of breath, or any new concerns. She is concerned about arthritic pain.   Inpatient Medications    Scheduled Meds: . apixaban  5 mg Oral BID  . buPROPion  150 mg Oral q morning - 10a  . carvedilol  6.25 mg Oral BID WC  . dofetilide  500 mcg Oral BID  . gabapentin  600 mg Oral QID  . iron polysaccharides  150 mg Oral Daily  . lisinopril  40 mg Oral Daily  . potassium chloride  10 mEq Oral Daily  . sodium chloride flush  3 mL Intravenous Q12H  . vortioxetine HBr  10 mg Oral Daily   Continuous Infusions: . sodium chloride 250 mL (03/14/17 1513)   PRN Meds: sodium chloride, hydrALAZINE, meclizine, sodium chloride flush   Vital Signs    Vitals:   03/14/17 1129 03/14/17 1451 03/14/17 2152 03/15/17 0505  BP: (!) 171/80 (!) 182/72 (!) 198/86 (!) 160/62  Pulse: (!) 57 (!) 57 65 68  Resp:  20 18 18   Temp: 98.1 F (36.7 C) 97.7 F (36.5 C) 97.9 F (36.6 C) 98.2 F (36.8 C)  TempSrc: Oral Oral Oral Oral  SpO2: 100% 96% 99% 98%  Weight: 268 lb 1.6 oz (121.6 kg)     Height: 5\' 4"  (1.626 m)       Intake/Output Summary (Last 24 hours) at 03/15/17 0718 Last data filed at 03/14/17 1710  Gross per 24 hour  Intake            259.5 ml  Output                0 ml  Net            259.5 ml   Filed Weights   03/14/17 1129  Weight: 268 lb 1.6 oz (121.6 kg)    Physical Exam    GEN- The patient is obese appearing, alert and oriented x 3 today.   Head- normocephalic, atraumatic Eyes-  Sclera clear, conjunctiva pink Ears- hearing intact Oropharynx- clear Neck- supple Lungs- Clear to ausculation bilaterally, normal work of breathing Heart- Regular rate and rhythm, no murmurs, rubs or gallops GI- soft, NT, ND, + BS Extremities- no clubbing, cyanosis,  or edema Skin- no rash or lesion Psych- euthymic mood, full affect Neuro- strength and sensation are intact  Labs    CBC No results for input(s): WBC, NEUTROABS, HGB, HCT, MCV, PLT in the last 72 hours. Basic Metabolic Panel  Recent Labs  03/14/17 0957 03/15/17 0348  NA 140 139  K 4.1 3.7  CL 106 106  CO2 23 23  GLUCOSE 112* 132*  BUN 17 17  CREATININE 1.19* 1.29*  CALCIUM 9.3 8.8*  MG 1.8 2.2     Telemetry    Sinus rhythm (personally reviewed)  Radiology    No results found.   Assessment & Plan    1.  Paroxysmal atrial fibrillation Admitted 6/19 for Tikosyn load QTc, BMET, Mg stable.  K a little low, will supplement Continue Eliquis for CHADS2VASC of 3 Anticipate discharge Friday morning   2.  Arthritic pain Tylenol not effective Will try tramadol Advised she will need to follow with PCP at discharge   Dr Ladona Ridgel to see later today  Signed, Gypsy Balsam, NP  03/15/2017, 7:18 AM  EP Attending  Patient seen and examined. Agree with above. The patient is stable on initiation of dofetilide. We will follow her QT carefully and plan to DC home on Friday morning after her 6th dose of dofetilide.  Leonia ReevesGregg Dearra Myhand,M.D.

## 2017-03-16 ENCOUNTER — Encounter (HOSPITAL_COMMUNITY): Payer: Self-pay | Admitting: Nurse Practitioner

## 2017-03-16 LAB — BASIC METABOLIC PANEL
ANION GAP: 7 (ref 5–15)
BUN: 16 mg/dL (ref 6–20)
CALCIUM: 8.9 mg/dL (ref 8.9–10.3)
CHLORIDE: 108 mmol/L (ref 101–111)
CO2: 23 mmol/L (ref 22–32)
Creatinine, Ser: 1.2 mg/dL — ABNORMAL HIGH (ref 0.44–1.00)
GFR calc Af Amer: 54 mL/min — ABNORMAL LOW (ref >60)
GFR calc non Af Amer: 46 mL/min — ABNORMAL LOW (ref >60)
GLUCOSE: 103 mg/dL — AB (ref 65–99)
POTASSIUM: 4.2 mmol/L (ref 3.5–5.1)
Sodium: 138 mmol/L (ref 135–145)

## 2017-03-16 LAB — MAGNESIUM: MAGNESIUM: 2 mg/dL (ref 1.7–2.4)

## 2017-03-16 MED ORDER — HYDRALAZINE HCL 25 MG PO TABS
25.0000 mg | ORAL_TABLET | Freq: Three times a day (TID) | ORAL | Status: DC
  Administered 2017-03-16 – 2017-03-17 (×5): 25 mg via ORAL
  Filled 2017-03-16 (×5): qty 1

## 2017-03-16 MED ORDER — CARVEDILOL 6.25 MG PO TABS
6.2500 mg | ORAL_TABLET | Freq: Two times a day (BID) | ORAL | Status: DC
Start: 2017-03-16 — End: 2017-03-17
  Administered 2017-03-16 (×2): 6.25 mg via ORAL
  Filled 2017-03-16 (×2): qty 1

## 2017-03-16 MED ORDER — CARVEDILOL 12.5 MG PO TABS: 12.5000 mg | ORAL_TABLET | Freq: Two times a day (BID) | ORAL | Status: DC

## 2017-03-16 NOTE — Discharge Summary (Signed)
ELECTROPHYSIOLOGY PROCEDURE DISCHARGE SUMMARY    Patient ID: [name],  MRN: [id number], DOB/AGE: [birth year] 66 y.o.  Admit date: 03/14/2017 Discharge date: 03/17/2017  Primary Care Physician: Jarome MatinPaterson, Daniel, MD Primary Cardiologist: Anne FuSkains Electrophysiologist: Ladona Ridgelaylor  Primary Discharge Diagnosis:  1.  Paroxysmal atrial fibrillation status post Tikosyn loading this admission  Secondary Discharge Diagnosis:  1.  Hypertension 2.  Obesity 3.  OSA   Allergies  Allergen Reactions  . Zolpidem Tartrate Other (See Comments)    hallucinations  . Amoxicillin Rash     Procedures This Admission:  1.  Tikosyn loading  Brief HPI: Yvette ShutterCarolyn I Orozco is a 66 y.o. female with a past medical history as noted above.  They were referred to EP in the outpatient setting for treatment options of atrial fibrillation.  Risks, benefits, and alternatives to Tikosyn were reviewed with the patient who wished to proceed.    Hospital Course:  The patient was admitted and Tikosyn was initiated.  Renal function and electrolytes were followed during the hospitalization.  Their QTc remained stable. They were monitored until discharge on telemetry which demonstrated sinus rhythm.  On the day of discharge, they were examined by Dr Ladona Ridgelaylor who considered them stable for discharge to home.  [facility] 1 week and with Dr Ladona Ridgelaylor in 4 weeks.   BP was elevated after discontinuation of Verapamil and HCTZ.  Coreg was increased and Hydralazine was added for home. She will need BP recheck at follow up.   Physical Exam: Vitals:   03/16/17 1322 03/16/17 1942 03/16/17 2204 03/17/17 0421  BP: (!) 153/66 (!) 172/70 (!) 129/54 (!) 159/75  Pulse: 70 65  70  Resp: 18 18  18   Temp: 97.8 F (36.6 C) 98.1 F (36.7 C)  97.9 F (36.6 C)  TempSrc: Oral Oral  Oral  SpO2: 97% 98%  98%  Weight:      Height:        GEN- The patient is well appearing, alert and oriented  x 3 today.   HEENT: normocephalic, atraumatic; sclera clear, conjunctiva pink; hearing intact; oropharynx clear; neck supple  Lungs- Clear to ausculation bilaterally, normal work of breathing.  No wheezes, rales, rhonchi Heart- Regular rate and rhythm, no murmurs, rubs or gallops  GI- soft, non-tender, non-distended, bowel sounds present  Extremities- no clubbing, cyanosis, or edema  MS- no significant deformity or atrophy Skin- warm and dry, no rash or lesion Psych- euthymic mood, full affect Neuro- strength and sensation are intact   Labs:   Lab Results  Component Value Date   WBC 14.3 (H) 06/12/2015   HGB 10.7 (L) 06/12/2015   HCT 37.8 06/12/2015   MCV 75.3 (L) 06/12/2015   PLT 484 (H) 06/12/2015     Recent Labs Lab 03/17/17 0233  NA 138  K 3.9  CL 107  CO2 23  BUN 15  CREATININE 1.15*  CALCIUM 8.9  GLUCOSE 99     Discharge Medications:  Allergies as of 03/17/2017      Reactions   Zolpidem Tartrate Other (See Comments)   hallucinations   Amoxicillin Rash      Medication List    TAKE these medications   buPROPion 150 MG 24 hr tablet Commonly known as:  WELLBUTRIN XL Take 150 mg by mouth every morning.   carvedilol 12.5 MG tablet Commonly known as:  COREG Take 1 tablet (12.5 mg total) by mouth 2 (two) times daily with a meal. What  changed:  medication strength  how much to take   cyanocobalamin 1000 MCG/ML injection Commonly known as:  (VITAMIN B-12) Inject 1,000 mcg into the muscle every 30 (thirty) days.   dofetilide 500 MCG capsule Commonly known as:  TIKOSYN Take 1 capsule (500 mcg total) by mouth 2 (two) times daily.   ELIQUIS 5 MG Tabs tablet Generic drug:  apixaban Take 5 mg by mouth 2 (two) times daily.   gabapentin 600 MG tablet Commonly known as:  NEURONTIN Take 600 mg by mouth 4 (four) times daily.   hydrALAZINE 25 MG tablet Commonly known as:  APRESOLINE Take 1 tablet (25 mg total) by mouth every 8 (eight) hours.   iron  polysaccharides 150 MG capsule Commonly known as:  NIFEREX Take 150 mg by mouth daily.   lisinopril 20 MG tablet Commonly known as:  PRINIVIL,ZESTRIL Take 20 mg by mouth daily.   meclizine 25 MG tablet Commonly known as:  ANTIVERT Take 25 mg by mouth 3 (three) times daily as needed for dizziness.   MULTIVITAMIN & MINERAL PO Take 1 tablet by mouth daily.   potassium chloride 10 MEQ tablet Commonly known as:  K-DUR Take 1 tablet (10 mEq total) by mouth daily.   TRINTELLIX 10 MG Tabs Generic drug:  vortioxetine HBr Take 10 mg by mouth daily.       Disposition:  Discharge Instructions    Diet - low sodium heart healthy    Complete by:  As directed    Increase activity slowly    Complete by:  As directed      Follow-up Information    [facility] Follow up on 03/24/2017.   Specialty:  Cardiology Why:  at [facility] information: [id number] [place] [address] [phone]       Marinus Maw, MD Follow up on 04/18/2017.   Specialty:  Cardiology Why:  at 12:15PM  Contact information: [address] [place] [address] [phone]           Duration of Discharge Encounter: Greater than 30 minutes including physician time.  Signed, Gypsy Balsam, NP 03/17/2017 6:24 AM  EP Attending  Patient seen and examined. Her QT is stable. Her BP is not well controlled. I would anticipate outpatient uptitration of her coreg as her HR allows. Followup as demonstrated above.   Leonia Reeves.D.

## 2017-03-16 NOTE — Progress Notes (Signed)
   Electrophysiology Rounding Note  Patient Name: Yvette ShutterCarolyn I Bublitz Date of Encounter: 03/16/2017  Electrophysiologist: Ladona Ridgelaylor   Subjective   The patient is doing well today.  At this time, the patient denies chest pain, shortness of breath, or any new concerns. Elevated BP last night with headache.   Inpatient Medications    Scheduled Meds: . apixaban  5 mg Oral BID  . buPROPion  150 mg Oral q morning - 10a  . carvedilol  6.25 mg Oral BID WC  . dofetilide  500 mcg Oral BID  . gabapentin  600 mg Oral QID  . hydrALAZINE  25 mg Oral Q8H  . iron polysaccharides  150 mg Oral Daily  . lisinopril  40 mg Oral Daily  . potassium chloride  10 mEq Oral Daily  . sodium chloride flush  3 mL Intravenous Q12H  . vortioxetine HBr  10 mg Oral Daily   Continuous Infusions: . sodium chloride 250 mL (03/14/17 1513)   PRN Meds: sodium chloride, acetaminophen, hydrALAZINE, meclizine, sodium chloride flush, traMADol   Vital Signs    Vitals:   03/15/17 2214 03/15/17 2247 03/16/17 0522 03/16/17 0636  BP: (!) 181/70 (!) 162/63 (!) 194/74 (!) 161/73  Pulse:   63   Resp:   18   Temp:   97.6 F (36.4 C)   TempSrc:   Oral   SpO2:   97%   Weight:      Height:       No intake or output data in the 24 hours ending 03/16/17 0832 Filed Weights   03/14/17 1129  Weight: 268 lb 1.6 oz (121.6 kg)    Physical Exam    GEN- The patient is obese appearing, alert and oriented x 3 today.   Head- normocephalic, atraumatic Eyes-  Sclera clear, conjunctiva pink Ears- hearing intact Oropharynx- clear Neck- supple Lungs- Clear to ausculation bilaterally, normal work of breathing Heart- Regular rate and rhythm, no murmurs, rubs or gallops GI- soft, NT, ND, + BS Extremities- no clubbing, cyanosis, or edema Skin- no rash or lesion Psych- euthymic mood, full affect Neuro- strength and sensation are intact  Labs    CBC No results for input(s): WBC, NEUTROABS, HGB, HCT, MCV, PLT in the last 72  hours. Basic Metabolic Panel  Recent Labs  03/15/17 0348 03/16/17 0324  NA 139 138  K 3.7 4.2  CL 106 108  CO2 23 23  GLUCOSE 132* 103*  BUN 17 16  CREATININE 1.29* 1.20*  CALCIUM 8.8* 8.9  MG 2.2 2.0     Telemetry    Sinus rhythm (personally reviewed)  Radiology    No results found.   Assessment & Plan    1.  Paroxysmal atrial fibrillation Admitted 6/19 for Tikosyn load QTc, BMET, Mg stable.    Continue Eliquis for CHADS2VASC of 3 Anticipate discharge Friday morning   2.  HTN BP elevated (Verapamil and HCTZ discontinued in anticipation of Tikosyn loading) Will add po Hydralazine today and follow  Dr Ladona Ridgelaylor to see later today  Signed, Gypsy BalsamAmber Seiler, NP  03/16/2017, 8:32 AM    EP Attending  Patient seen and examined. Agree with above. The patient c/o HA today which I think is due to her HTN. She has been started on Hydralizine and we will uptitrate her coreg carefully. I would anticipate her QT to be stable and that she be able to be dischareged home tomorrow after her morning dose of dofetilide.  Leonia ReevesGregg Jacere Pangborn,M.D.

## 2017-03-16 NOTE — Discharge Instructions (Signed)
You have an appointment set up with the Atrial Fibrillation Clinic.  Multiple studies have shown that being followed by a dedicated atrial fibrillation clinic in addition to the standard care you receive from your other physicians improves health. We believe that enrollment in the atrial fibrillation clinic will allow us to better care for you.  ° °The phone number to the Atrial Fibrillation Clinic is [phone]. The clinic is staffed Monday through Friday from 8:30am to 5pm. ° °Parking Directions: The clinic is located in the Heart and Vascular Building connected to [facility]. °1)From [address] turn on to [address] and go to the 3rd entrance  (Heart and Vascular entrance) on the right. °2)Look to the right for Heart &Vascular Parking Garage. °3)A code for the entrance is required please call the clinic to receive this.   °4)Take the elevators to the 1st floor. Registration is in the room with the glass walls at the end of the hallway. ° °If you have any trouble parking or locating the clinic, please don’t hesitate to call [phone]. ° °Information on my medicine - Pradaxa® (dabigatran) ° °This medication education was reviewed with me or my healthcare representative as part of my discharge preparation.   ° °Why was Pradaxa® prescribed for you? °Pradaxa® was prescribed for you to reduce the risk of forming blood clots that cause a stroke if you have a medical condition called atrial fibrillation (a type of irregular heartbeat).   ° °What do you Need to know about PradAXa®? °Take your Pradaxa® TWICE DAILY - one capsule in the morning and one tablet in the evening with or without food.  It would be best to take the doses about the same time each day. ° °The capsules should not be broken, chewed or opened - they must be swallowed whole. ° °Do not store Pradaxa in other medication containers - once the bottle is opened the Pradaxa should be used within FOUR months; throw away any capsules that  haven’t been by that time. ° °Take Pradaxa® exactly as prescribed by your doctor.  DO NOT stop taking Pradaxa® without talking to the doctor who prescribed the medication.  Stopping without other stroke prevention medication to take the place of Pradaxa may increase your risk of developing a clot that causes a stroke.  Refill your prescription before you run out. ° °After discharge, you should have regular check-up appointments with your healthcare provider that is prescribing your Pradaxa®.  In the future your dose may need to be changed if your kidney function or weight changes by a significant amount. ° °What do you do if you miss a dose? °If you miss a dose, take it as soon as you remember on the same day.  If your next dose is less than 6 hours away, skip the missed dose.  Do not take two doses of PRADAXA at the same time. ° °Important Safety Information °A possible side effect of Pradaxa® is bleeding. You should call your healthcare provider right away if you experience any of the following: °? Bleeding from an injury or your nose that does not stop. °? Unusual colored urine (red or dark brown) or unusual colored stools (red or black). °? Unusual bruising for unknown reasons. °? A serious fall or if you hit your head (even if there is no bleeding). ° °Some medicines may interact with Pradaxa® and might increase your risk of bleeding or clotting while on Pradaxa®. To help avoid this, consult your healthcare provider or   pharmacist prior to using any new prescription or non-prescription medications, including herbals, vitamins, non-steroidal anti-inflammatory drugs (NSAIDs) and supplements. ° °This website has more information on Pradaxa® (dabigatran): https://www.pradaxa.com ° ° ° °

## 2017-03-16 NOTE — Care Management Note (Signed)
Case Management Note Donn PieriniKristi Ramonia Mcclaran RN, BSN Unit 2W-Case Manager [phone]  Patient Details  Name: [name] MRN: [id number] Date of Birth: 10-01-1950  Subjective/Objective:   Pt admitted with afib for Tikosyn load                 Action/Plan: PTA pt lived at home- independent- referral received for Tikosyn needs- per insurance check - copay is 58$12- spoke with pt at bedside- coverage shared pt confirmed that she too had spoken with insurance and was given the same copay cost- per pt she uses [facility] [address]- pt will need 7 day supply [facility] on day of discharge- MD to provide script to take to [facility] to fill- pt will also need script sent to her [facility] pharmacy for them to order drug to be in stock.   Expected Discharge Date:    03/17/17              Expected Discharge Plan:  Home/Self Care  In-House Referral:     Discharge planning Services  CM Consult, Medication Assistance  Post Acute Care Choice:  NA Choice offered to:  NA  DME Arranged:    DME Agency:     HH Arranged:    HH Agency:     Status of Service:  Completed, signed off  If discussed at Long Length of Stay Meetings, dates discussed:    Discharge Disposition: home/self care  Additional Comments:  Darrold SpanWebster, Jaci Desanto Hall, RN 03/16/2017, 2:28 PM

## 2017-03-17 LAB — MAGNESIUM: Magnesium: 1.9 mg/dL (ref 1.7–2.4)

## 2017-03-17 LAB — BASIC METABOLIC PANEL
ANION GAP: 8 (ref 5–15)
BUN: 15 mg/dL (ref 6–20)
CO2: 23 mmol/L (ref 22–32)
Calcium: 8.9 mg/dL (ref 8.9–10.3)
Chloride: 107 mmol/L (ref 101–111)
Creatinine, Ser: 1.15 mg/dL — ABNORMAL HIGH (ref 0.44–1.00)
GFR calc Af Amer: 57 mL/min — ABNORMAL LOW (ref >60)
GFR, EST NON AFRICAN AMERICAN: 49 mL/min — AB (ref >60)
GLUCOSE: 99 mg/dL (ref 65–99)
POTASSIUM: 3.9 mmol/L (ref 3.5–5.1)
Sodium: 138 mmol/L (ref 135–145)

## 2017-03-17 MED ORDER — HYDRALAZINE HCL 25 MG PO TABS
25.0000 mg | ORAL_TABLET | Freq: Three times a day (TID) | ORAL | 1 refills | Status: DC
Start: 2017-03-17 — End: 2017-03-22

## 2017-03-17 MED ORDER — CARVEDILOL 12.5 MG PO TABS: 12.5000 mg | ORAL_TABLET | Freq: Two times a day (BID) | ORAL | 1 refills | Status: DC

## 2017-03-17 MED ORDER — CARVEDILOL 12.5 MG PO TABS: 6.2500 mg | ORAL_TABLET | Freq: Two times a day (BID) | ORAL | 1 refills | Status: DC

## 2017-03-17 MED ORDER — DOFETILIDE 500 MCG PO CAPS: 500.0000 ug | ORAL_CAPSULE | Freq: Two times a day (BID) | ORAL | 1 refills | Status: DC

## 2017-03-17 MED ORDER — CARVEDILOL 12.5 MG PO TABS
12.5000 mg | ORAL_TABLET | Freq: Two times a day (BID) | ORAL | Status: DC
  Administered 2017-03-17: 12.5 mg via ORAL

## 2017-03-17 NOTE — Progress Notes (Signed)
Patient provided with 7 day supply of Tikosyn. Rx [facility] to [facility] [id number] - [place], [place] - [address] [facility] [address]. Patient aware that it will be ordered by pharmacy. She states she will check to verify tonight.

## 2017-03-17 NOTE — Consult Note (Signed)
           [facility] CM Primary Care Navigator  03/17/2017  Enriqueta ShutterCarolyn I Yordy 1950-11-30 [id number]   Went to see patientat the bedsideto identify possible discharge needs but she was alreadydischarged.  Patient was discharged home per RN report.  Primary care provider's office called (DJ)to notify of patient's discharge and need for post hospital follow-up and transition of care. Reminded also of patient's health issues needing follow-up. Made aware to refer [facility] to [facility] care management ifdeemed necessaryfor services.   For questions, please contact:  Wyatt HasteLorraine Frady Taddeo, BSN, RN- [facility] Primary Care Navigator  Telephone: [phone] [facility]

## 2017-03-17 NOTE — Care Management Important Message (Signed)
Important Message  Patient Details  Name: Enriqueta ShutterCarolyn I Steckman MRN: [id number] Date of Birth: March 25, 1951   Medicare Important Message Given:  Yes    Zulay Corrie Stefan ChurchBratton 03/17/2017, 12:14 PM

## 2017-03-17 NOTE — Progress Notes (Signed)
03/17/2017 2:42 PM Discharge AVS meds taken today and those due this evening reviewed.  Follow-up appointments and when to call md reviewed.  D/C IV and TELE.  Questions and concerns addressed.   D/C home per orders. Kathryne HitchAllen, Juanice Warburton C

## 2017-03-22 ENCOUNTER — Emergency Department (HOSPITAL_COMMUNITY): Payer: PPO

## 2017-03-22 ENCOUNTER — Telehealth: Payer: Self-pay | Admitting: Internal Medicine

## 2017-03-22 ENCOUNTER — Emergency Department (HOSPITAL_COMMUNITY)
Admission: EM | Admit: 2017-03-22 | Discharge: 2017-03-23 | Disposition: A | Discharge status: Home / Self Care | Payer: PPO | Attending: Emergency Medicine | Admitting: Emergency Medicine

## 2017-03-22 ENCOUNTER — Encounter (HOSPITAL_COMMUNITY): Payer: Self-pay

## 2017-03-22 DIAGNOSIS — Z79899 Other long term (current) drug therapy: Secondary | ICD-10-CM | POA: Diagnosis not present

## 2017-03-22 DIAGNOSIS — Z87891 Personal history of nicotine dependence: Secondary | ICD-10-CM | POA: Diagnosis not present

## 2017-03-22 DIAGNOSIS — R110 Nausea: Secondary | ICD-10-CM | POA: Insufficient documentation

## 2017-03-22 DIAGNOSIS — I10 Essential (primary) hypertension: Secondary | ICD-10-CM | POA: Insufficient documentation

## 2017-03-22 DIAGNOSIS — R42 Dizziness and giddiness: Secondary | ICD-10-CM | POA: Diagnosis not present

## 2017-03-22 DIAGNOSIS — G43009 Migraine without aura, not intractable, without status migrainosus: Secondary | ICD-10-CM | POA: Insufficient documentation

## 2017-03-22 DIAGNOSIS — R51 Headache: Secondary | ICD-10-CM | POA: Diagnosis not present

## 2017-03-22 DIAGNOSIS — I480 Paroxysmal atrial fibrillation: Secondary | ICD-10-CM | POA: Insufficient documentation

## 2017-03-22 DIAGNOSIS — Z7982 Long term (current) use of aspirin: Secondary | ICD-10-CM | POA: Diagnosis not present

## 2017-03-22 LAB — DIFFERENTIAL
Basophils Absolute: 0 10*3/uL (ref 0.0–0.1)
Basophils Relative: 0 %
EOS PCT: 0 %
Eosinophils Absolute: 0 10*3/uL (ref 0.0–0.7)
LYMPHS ABS: 2 10*3/uL (ref 0.7–4.0)
LYMPHS PCT: 22 %
MONO ABS: 0.8 10*3/uL (ref 0.1–1.0)
MONOS PCT: 8 %
NEUTROS ABS: 6.6 10*3/uL (ref 1.7–7.7)
Neutrophils Relative %: 70 %

## 2017-03-22 LAB — COMPREHENSIVE METABOLIC PANEL
ALK PHOS: 117 U/L (ref 38–126)
ALT: 14 U/L (ref 14–54)
AST: 20 U/L (ref 15–41)
Albumin: 3.8 g/dL (ref 3.5–5.0)
Anion gap: 10 (ref 5–15)
BILIRUBIN TOTAL: 0.5 mg/dL (ref 0.3–1.2)
BUN: 13 mg/dL (ref 6–20)
CALCIUM: 9.5 mg/dL (ref 8.9–10.3)
CO2: 21 mmol/L — AB (ref 22–32)
CREATININE: 1.22 mg/dL — AB (ref 0.44–1.00)
Chloride: 108 mmol/L (ref 101–111)
GFR calc Af Amer: 53 mL/min — ABNORMAL LOW (ref >60)
GFR, EST NON AFRICAN AMERICAN: 45 mL/min — AB (ref >60)
Glucose, Bld: 114 mg/dL — ABNORMAL HIGH (ref 65–99)
Potassium: 3.7 mmol/L (ref 3.5–5.1)
SODIUM: 139 mmol/L (ref 135–145)
TOTAL PROTEIN: 7.4 g/dL (ref 6.5–8.1)

## 2017-03-22 LAB — I-STAT TROPONIN, ED: Troponin i, poc: 0.01 ng/mL (ref 0.00–0.08)

## 2017-03-22 LAB — I-STAT CHEM 8, ED
BUN: 16 mg/dL (ref 6–20)
CALCIUM ION: 1.18 mmol/L (ref 1.15–1.40)
CHLORIDE: 107 mmol/L (ref 101–111)
Creatinine, Ser: 1.1 mg/dL — ABNORMAL HIGH (ref 0.44–1.00)
GLUCOSE: 111 mg/dL — AB (ref 65–99)
HCT: 40 % (ref 36.0–46.0)
Hemoglobin: 13.6 g/dL (ref 12.0–15.0)
Potassium: 3.7 mmol/L (ref 3.5–5.1)
Sodium: 141 mmol/L (ref 135–145)
TCO2: 21 mmol/L (ref 0–100)

## 2017-03-22 LAB — CBC
HEMATOCRIT: 40.4 % (ref 36.0–46.0)
HEMOGLOBIN: 13 g/dL (ref 12.0–15.0)
MCH: 27.3 pg (ref 26.0–34.0)
MCHC: 32.2 g/dL (ref 30.0–36.0)
MCV: 84.7 fL (ref 78.0–100.0)
Platelets: 306 10*3/uL (ref 150–400)
RBC: 4.77 MIL/uL (ref 3.87–5.11)
RDW: 19.4 % — ABNORMAL HIGH (ref 11.5–15.5)
WBC: 9.5 10*3/uL (ref 4.0–10.5)

## 2017-03-22 LAB — PROTIME-INR
INR: 1.11
Prothrombin Time: 14.3 seconds (ref 11.4–15.2)

## 2017-03-22 LAB — APTT: aPTT: 39 seconds — ABNORMAL HIGH (ref 24–36)

## 2017-03-22 MED ORDER — ISOSORBIDE MONONITRATE ER 30 MG PO TB24: 15.0000 mg | ORAL_TABLET | Freq: Every day | ORAL | 3 refills | Status: DC

## 2017-03-22 MED ORDER — HYDRALAZINE HCL 50 MG PO TABS: 50.0000 mg | ORAL_TABLET | Freq: Three times a day (TID) | ORAL | 3 refills | Status: DC

## 2017-03-22 MED ORDER — LISINOPRIL 40 MG PO TABS: 40.0000 mg | ORAL_TABLET | Freq: Every day | ORAL | 3 refills | Status: DC

## 2017-03-22 NOTE — Telephone Encounter (Signed)
°  New Prob  Pt c/o BP issue: STAT if pt c/o blurred vision, one-sided weakness or slurred speech  1. What are your last 5 BP readings?  161/84 (yesterday at 3 PM) 215/114 pulse 68 (this morning 3:30 AM) 207/109 (this morning 7:00 AM)   2. Are you having any other symptoms (ex. Dizziness, headache, blurred vision, passed out)? Woke up with a bad headache and is now nauseated.  3. What is your BP issue? States blood pressure has been high in the last several hours.

## 2017-03-22 NOTE — ED Notes (Signed)
Helped pt from wheelchair to regular chair.  Pt also given warm blanket for comfort and ice water.  No acute distress at this time

## 2017-03-22 NOTE — Telephone Encounter (Signed)
Discussed with Dr. Delton SeeNelson (DOD) and she advised for patient to increase Hydralazine to 50 mg TID and start imdur 15 mg QD. She also wanted patient seen next week by APP for HTN follow up. Patient made aware of recommendations. Appointment made for 03/27/17 at 1:30PM with Norma FredricksonLori Gerhardt, NP. Patient advised to continue to monitor BP. Patient verbalized understanding. Rx sent to patient's preferred pharmacy.

## 2017-03-22 NOTE — ED Triage Notes (Signed)
Pt states that she started having dizziness today along with nausea and a migraine since 330 am. No vomiting, states she has not ate today. Hx of afib

## 2017-03-22 NOTE — Telephone Encounter (Signed)
Patient calling complaining of HTN (161/84 yesterday, 215/114 this morning 3:30 AM, 207/109 this morning 7:00 AM, with HR 68-72. Patient complaining of HA and nausea. Patient denies having chest pain, vision changes, SOB, syncope, dizziness, or any other symptoms. Patient recently discharged from the hospital from tikosyn load. Patient had to stop taking verapamil and HCTZ since starting tikosyn. Patient is currently taking hydralazine 25 mg TID, carvedilol 12.5 mg BID, and lisinopril 40 mg QD. Per Dr. Ladona Ridgelaylor on last OV note and hospital discharge, patient has had difficulty controlling BP and may need increase in coreg. Will discuss with DOD.

## 2017-03-23 LAB — GLUCOSE, CAPILLARY: GLUCOSE-CAPILLARY: 114 mg/dL — AB (ref 65–99)

## 2017-03-23 MED ORDER — METOCLOPRAMIDE HCL 5 MG/ML IJ SOLN
10.0000 mg | Freq: Once | INTRAMUSCULAR | Status: AC
  Administered 2017-03-23: 10 mg via INTRAVENOUS
  Filled 2017-03-23: qty 2

## 2017-03-23 MED ORDER — DIPHENHYDRAMINE HCL 50 MG/ML IJ SOLN
25.0000 mg | Freq: Once | INTRAMUSCULAR | Status: AC
  Administered 2017-03-23: 25 mg via INTRAVENOUS
  Filled 2017-03-23: qty 1

## 2017-03-23 MED ORDER — HYDRALAZINE HCL 20 MG/ML IJ SOLN
5.0000 mg | Freq: Once | INTRAMUSCULAR | Status: AC
  Administered 2017-03-23: 5 mg via INTRAVENOUS
  Filled 2017-03-23: qty 1

## 2017-03-23 MED ORDER — SODIUM CHLORIDE 0.9 % IV BOLUS (SEPSIS)
1000.0000 mL | Freq: Once | INTRAVENOUS | Status: AC
  Administered 2017-03-23: 1000 mL via INTRAVENOUS

## 2017-03-23 NOTE — ED Provider Notes (Signed)
[facility]-EMERGENCY DEPT Provider Note   CSN: [id number] Arrival date & time: 03/22/17  1904  By signing my name below, I, Diona BrownerJennifer Gorman, attest that this documentation has been prepared under the direction and in the presence of Devoria AlbeKnapp, La Dibella, MD. Electronically Signed: Diona BrownerJennifer Gorman, ED Scribe. 03/23/17. 12:56 AM.  TIME SEEN: 00:39  (not in room at 23:54 PM)  History   Chief Complaint Chief Complaint  Patient presents with  . Dizziness    HPI [name] is a 66 y.o. female who presents to the Emergency Department complaining of a severe HA that woke her up at 3:30 am on 03/22/17. Pt notes at the time it was her whole head and that she felt like her head was going to explode. Currently pain is still the same, however it is now only on the left side of her head. She reports history of prior migraines like today, but hasn't had one in 20 years. Associated sx include nausea. Pt reports taking 2 tylenol at 3:30 am and 2 tylenol at 4:10 pm with no relief. She also took 25 mg of hydralazine at 3:30 am and again ~ 8 am after speaking to her PCP because her BP was over 200 systolic, who advised her to increase the dosage to 50 mg TID. Her blood pressure was 190/104 at home. Hx of migraines. Doesn't smoke, drink or do drugs. She has an appointment with her PCP on Friday, 03/24/17. She is also currently on Eliquis, Coreg, Lisinopril, hydralazine, and Tikosyn. Pt denies vomiting, visual changes, numbness, tingling, CP, palpitations or SOB.  Patient was recently taken off her verapamil and HCTZ and started on Tikosyn for her atrial fibrillation. She states her pulse rate was getting into the 40's. She also was just started on Imdur were today because of her reported high blood pressure and her hydralazine was increased from 25 mg every 8 hours to 50 g every 8 hours today.  Patient has an appointment on June 29 with her PCP, and on July 2 she has a appointment in the atrial fib clinic and the hypertension  clinic at Dr. Lubertha Basqueaylor's office.  PCP: Jarome MatinPaterson, Daniel, MD Cardiology Dr Ladona Ridgelaylor  The history is provided by the patient. No language interpreter was used.    Past Medical History:  Diagnosis Date  . Anxiety   . 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  PT ALSO HAS LOWER BACK PAIN AT TIMES  . Depression   . GERD (gastroesophageal reflux disease)    RARE- OTC ANTACID IF NEEDED--PAST HX OF ULCER  . Headache    wakes up with headache   . Hyperlipidemia   . Hypertension   . Kidney stones    PT PASSED STONES--NO KNOWN STONES AT PRESENT TIMES  . Lumbago 10/02/2013  . Obesity   . OSA (obstructive sleep apnea) 12/30/2013   Very mild OSA with REM accentuation,  tested 11-29-13 at [facility], Dr Frances FurbishAthar .   Marland Kitchen. Periodic limb movement disorder (PLMD) 12/30/2013  . Restless leg syndrome   . Ulcer   . Urinary incontinence     Patient Active Problem List   Diagnosis Date Noted  . Paroxysmal atrial fibrillation (HCC) 03/14/2017  . Dyspnea 06/12/2015  . Morbid obesity (HCC) 06/12/2015  . PAF (paroxysmal atrial fibrillation) (HCC) 06/12/2015  . 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 Periodic limb movement disorder (PLMD) 12/30/2013  . OSA (obstructive sleep apnea) 12/30/2013  . Dizziness and giddiness 10/02/2013  . Abnormality of  gait 10/02/2013  . Lumbago 10/02/2013  . Essential hypertension 02/17/2012  . 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 Palpitations 12/24/2009  . DYSPNEA ON EXERTION 12/24/2009  . CHEST PAIN 12/24/2009    Past Surgical History:  Procedure Laterality Date  . BILATERAL BUNIONECTOMY 1979    . BREAST SURGERY     biopsy   . COLON SURGERY    . SMALL INTESTINE SURGERY    . SURGERY FOR BOWEL OBSTRUCTION AND REMOVAL OF PART OF STOMACH  IN 2000  2000  . TOTAL KNEE ARTHROPLASTY  02/16/2012   Procedure: TOTAL KNEE ARTHROPLASTY;  Surgeon: Javier Docker, MD;  Location: [facility];  Service: Orthopedics;  Laterality: Left;  . TOTAL KNEE ARTHROPLASTY Right 11/20/2014    Procedure: RIGHT TOTAL KNEE ARTHROPLASTY;  Surgeon: Javier Docker, MD;  Location: [facility];  Service: Orthopedics;  Laterality: Right;    OB History    No data available       Home Medications    Prior to Admission medications   Medication Sig Start Date End Date Taking? Authorizing Provider  buPROPion (WELLBUTRIN XL) 150 MG 24 hr tablet Take 150 mg by mouth every morning.   Yes [provider]  carvedilol (COREG) 12.5 MG tablet Take 1 tablet (12.5 mg total) by mouth 2 (two) times daily with a meal. 03/17/17  Yes Seiler, [facility] K, NP  cyanocobalamin (,VITAMIN B-12,) 1000 MCG/ML injection Inject 1,000 mcg into the muscle every 30 (thirty) days.   Yes [provider]  dofetilide (TIKOSYN) 500 MCG capsule Take 1 capsule (500 mcg total) by mouth 2 (two) times daily. 03/17/17  Yes Seiler, Amber K, NP  ELIQUIS 5 MG TABS tablet Take 5 mg by mouth 2 (two) times daily. 10/20/16  Yes [provider]  gabapentin (NEURONTIN) 600 MG tablet Take 600 mg by mouth 4 (four) times daily.   Yes [provider]  hydrALAZINE (APRESOLINE) 50 MG tablet Take 1 tablet (50 mg total) by mouth 3 (three) times daily. 03/22/17 06/20/17 Yes Lars Masson, MD  iron polysaccharides (NIFEREX) 150 MG capsule Take 150 mg by mouth daily.   Yes [provider]  isosorbide mononitrate (IMDUR) 30 MG 24 hr tablet Take 0.5 tablets (15 mg total) by mouth daily. 03/22/17  Yes Lars Masson, MD  lisinopril (PRINIVIL,ZESTRIL) 40 MG tablet Take 1 tablet (40 mg total) by mouth daily. 03/22/17 06/20/17 Yes Marinus Maw, MD  meclizine (ANTIVERT) 25 MG tablet Take 25 mg by mouth 3 (three) times daily as needed for dizziness. 10/27/16  Yes [provider]  Multiple Vitamins-Minerals (MULTIVITAMIN & MINERAL PO) Take 1 tablet by mouth daily.   Yes [provider]  potassium chloride (K-DUR) 10 MEQ tablet Take 1 tablet (10 mEq total) by mouth daily. 12/30/16 03/30/17 Yes Janetta Hora, PA-C  vortioxetine HBr (TRINTELLIX) 10 MG TABS Take 10 mg by mouth daily.   Yes [provider]    Family History Family History  Problem Relation Age of Onset  . Sleep apnea Father   . Multiple sclerosis Sister   . Multiple sclerosis Sister     Social History Social History  Substance Use Topics  . Smoking status: Former Smoker    Packs/day: 2.00    Years: 20.00    Types: Cigarettes    Quit date: 10/17/1988  . Smokeless tobacco: Never Used  . Alcohol use No  retired   Allergies   Zolpidem tartrate and Amoxicillin   Review  of Systems Review of Systems  Eyes: Negative for visual disturbance.  Respiratory: Negative for shortness of breath.   Cardiovascular: Negative for chest pain.  Gastrointestinal: Positive for nausea. Negative for vomiting.  Neurological: Positive for dizziness and headaches. Negative for numbness.  All other systems reviewed and are negative.    Physical Exam Updated Vital Signs ED Triage Vitals  Enc Vitals Group     BP 03/22/17 1938 (!) 175/83     Pulse Rate 03/22/17 1938 63     Resp 03/22/17 1938 18     Temp 03/22/17 1938 98.2 F (36.8 C)     Temp Source 03/22/17 1938 Oral     SpO2 03/22/17 1938 98 %     Weight --      Height --      Head Circumference --      Peak Flow --      Pain Score 03/22/17 1945 9     Pain Loc --      Pain Edu? --      Excl. in GC? --    Vital signs normal Except for hypertension   Physical Exam  Constitutional: She is oriented to person, place, and time. She appears well-developed and well-nourished.  Non-toxic appearance. She does not appear ill. No distress.  HENT:  Head: Normocephalic and atraumatic.  Right Ear: External ear normal.  Left Ear: External ear normal.  Nose: Nose normal. No mucosal edema or rhinorrhea.  Mouth/Throat: Oropharynx is clear and moist and mucous membranes are normal. No dental abscesses or uvula swelling.  Eyes: Conjunctivae and EOM are normal. Pupils are  equal, round, and reactive to light.  Neck: Normal range of motion and full passive range of motion without pain. Neck supple.  Cardiovascular: Normal rate, regular rhythm and normal heart sounds.  Exam reveals no gallop and no friction rub.   No murmur heard. Pulmonary/Chest: Effort normal and breath sounds normal. No respiratory distress. She has no wheezes. She has no rhonchi. She has no rales. She exhibits no tenderness and no crepitus.  Abdominal: Soft. Normal appearance and bowel sounds are normal. She exhibits no distension. There is no tenderness. There is no rebound and no guarding.  Musculoskeletal: Normal range of motion. She exhibits no edema or tenderness.  Moves all extremities well.   Neurological: She is alert and oriented to person, place, and time. She has normal strength. No cranial nerve deficit.  Skin: Skin is warm, dry and intact. No rash noted. No erythema. No pallor.  Psychiatric: She has a normal mood and affect. Her speech is normal and behavior is normal. Her mood appears not anxious.  Nursing note and vitals reviewed.    ED Treatments / Results  DIAGNOSTIC STUDIES: Oxygen Saturation is 97% on RA, normal by my interpretation.   Labs (all labs ordered are listed, but only abnormal results are displayed) Results for orders placed or performed during the hospital encounter of 03/22/17  Protime-INR  Result Value Ref Range   Prothrombin Time 14.3 11.4 - 15.2 seconds   INR 1.11   APTT  Result Value Ref Range   aPTT 39 (H) 24 - 36 seconds  CBC  Result Value Ref Range   WBC 9.5 4.0 - 10.5 K/uL   RBC 4.77 3.87 - 5.11 MIL/uL   Hemoglobin 13.0 12.0 - 15.0 g/dL   HCT 16.1 09.6 - 04.5 %   MCV 84.7 78.0 - 100.0 fL   MCH 27.3 26.0 - 34.0 pg   MCHC  32.2 30.0 - 36.0 g/dL   RDW 16.1 (H) 09.6 - 04.5 %   Platelets 306 150 - 400 K/uL  Differential  Result Value Ref Range   Neutrophils Relative % 70 %   Neutro Abs 6.6 1.7 - 7.7 K/uL   Lymphocytes Relative 22 %    Lymphs Abs 2.0 0.7 - 4.0 K/uL   Monocytes Relative 8 %   Monocytes Absolute 0.8 0.1 - 1.0 K/uL   Eosinophils Relative 0 %   Eosinophils Absolute 0.0 0.0 - 0.7 K/uL   Basophils Relative 0 %   Basophils Absolute 0.0 0.0 - 0.1 K/uL  Comprehensive metabolic panel  Result Value Ref Range   Sodium 139 135 - 145 mmol/L   Potassium 3.7 3.5 - 5.1 mmol/L   Chloride 108 101 - 111 mmol/L   CO2 21 (L) 22 - 32 mmol/L   Glucose, Bld 114 (H) 65 - 99 mg/dL   BUN 13 6 - 20 mg/dL   Creatinine, Ser 4.09 (H) 0.44 - 1.00 mg/dL   Calcium 9.5 8.9 - 81.1 mg/dL   Total Protein 7.4 6.5 - 8.1 g/dL   Albumin 3.8 3.5 - 5.0 g/dL   AST 20 15 - 41 U/L   ALT 14 14 - 54 U/L   Alkaline Phosphatase 117 38 - 126 U/L   Total Bilirubin 0.5 0.3 - 1.2 mg/dL   GFR calc non Af Amer 45 (L) >60 mL/min   GFR calc Af Amer 53 (L) >60 mL/min   Anion gap 10 5 - 15  I-stat troponin, ED  Result Value Ref Range   Troponin i, poc 0.01 0.00 - 0.08 ng/mL   Comment 3          I-Stat Chem 8, ED  Result Value Ref Range   Sodium 141 135 - 145 mmol/L   Potassium 3.7 3.5 - 5.1 mmol/L   Chloride 107 101 - 111 mmol/L   BUN 16 6 - 20 mg/dL   Creatinine, Ser 9.14 (H) 0.44 - 1.00 mg/dL   Glucose, Bld 782 (H) 65 - 99 mg/dL   Calcium, Ion 9.56 2.13 - 1.40 mmol/L   TCO2 21 0 - 100 mmol/L   Hemoglobin 13.6 12.0 - 15.0 g/dL   HCT 08.6 57.8 - 46.9 %   Laboratory interpretation all normal except Improving chronic renal insufficiency,    EKG  EKG Interpretation  Date/Time:  Wednesday March 22 2017 19:54:13 EDT Ventricular Rate:  68 PR Interval:  154 QRS Duration: 92 QT Interval:  462 QTC Calculation: 491 R Axis:   28 Text Interpretation:  Normal sinus rhythm Incomplete right bundle branch block Prolonged QT No significant change since last tracing 17 Mar 2017 Confirmed by Devoria Albe ([id number]) on 03/22/2017 11:01:58 PM       Radiology Ct Head Wo Contrast  Result Date: 03/22/2017 CLINICAL DATA:  66 year old female with dizziness for  1 day. EXAM: CT HEAD WITHOUT CONTRAST TECHNIQUE: Contiguous axial images were obtained from the base of the skull through the vertex without intravenous contrast. COMPARISON:  11/08/2013 MR FINDINGS: Brain: No evidence of acute infarction, hemorrhage, hydrocephalus, extra-axial collection or mass lesion/mass effect. Chronic white matter hypodensities are unchanged. Vascular: Intracranial atherosclerotic calcifications noted. Skull: Normal. Negative for fracture or focal lesion. Sinuses/Orbits: No acute finding. Other: None IMPRESSION: No evidence of acute intracranial abnormality. Unchanged white matter disease -likely chronic small-vessel ischemic changes. Electronically Signed   By: Harmon Pier M.D.   On: 03/22/2017 20:33    Procedures Procedures (including  critical care time)  Medications Ordered in ED Medications  metoCLOPramide (REGLAN) injection 10 mg (10 mg Intravenous Given 03/23/17 0138)  diphenhydrAMINE (BENADRYL) injection 25 mg (25 mg Intravenous Given 03/23/17 0136)  hydrALAZINE (APRESOLINE) injection 5 mg (5 mg Intravenous Given 03/23/17 0137)  sodium chloride 0.9 % bolus 1,000 mL (1,000 mLs Intravenous New Bag/Given 03/23/17 0229)     Initial Impression / Assessment and Plan / ED Course  I have reviewed the triage vital signs and the nursing notes.  Pertinent labs & imaging results that were available during my care of the patient were reviewed by me and considered in my medical decision making (see chart for details).    COORDINATION OF CARE: 12:56 AM-Discussed next steps with pt which includes a migraine cocktail. Pt verbalized understanding and is agreeable with the plan.She was given hydralazine IV for her hypertension. We discussed it is not clear what came first the headache which caused the hypertension or the hypertension which caused a headache.  2:35 AM recheck. Pt rates her HA a 3/10 severity and states it is much improved. Her BP is 134/61. She feels ready to be  discharged.  Final Clinical Impressions(s) / ED Diagnoses   Final diagnoses:  Essential hypertension  Migraine without aura and without status migrainosus, not intractable    Plan discharge  Devoria Albe, MD, FACEP  I personally performed the services described in this documentation, which was scribed in my presence. The recorded information has been reviewed and considered.  Devoria Albe, MD, Concha Pyo, MD 03/23/17 [phone]

## 2017-03-23 NOTE — Telephone Encounter (Signed)
New Message     bp is back up to 160/80, what should she do now

## 2017-03-23 NOTE — Telephone Encounter (Signed)
Called, pt unavailable. Left voice message to return call.

## 2017-03-23 NOTE — Discharge Instructions (Signed)
Go home and rest. Take your medications at the changed doses as discussed today with Dr Lubertha Basqueaylor's office.  Recheck as needed.

## 2017-03-24 ENCOUNTER — Encounter (HOSPITAL_COMMUNITY): Payer: Self-pay | Admitting: Nurse Practitioner

## 2017-03-24 ENCOUNTER — Other Ambulatory Visit (HOSPITAL_COMMUNITY): Payer: Self-pay | Admitting: *Deleted

## 2017-03-24 ENCOUNTER — Ambulatory Visit (HOSPITAL_COMMUNITY)
Admit: 2017-03-24 | Discharge: 2017-03-24 | Disposition: A | Discharge status: Home / Self Care | Payer: PPO | Attending: Nurse Practitioner | Admitting: Nurse Practitioner

## 2017-03-24 VITALS — BP 138/72 | HR 60 | Ht 64.0 in | Wt 264.4 lb

## 2017-03-24 DIAGNOSIS — G2581 Restless legs syndrome: Secondary | ICD-10-CM | POA: Diagnosis not present

## 2017-03-24 DIAGNOSIS — I4891 Unspecified atrial fibrillation: Secondary | ICD-10-CM | POA: Diagnosis not present

## 2017-03-24 DIAGNOSIS — I10 Essential (primary) hypertension: Secondary | ICD-10-CM | POA: Diagnosis not present

## 2017-03-24 DIAGNOSIS — Z6841 Body Mass Index (BMI) 40.0 and over, adult: Secondary | ICD-10-CM | POA: Diagnosis not present

## 2017-03-24 DIAGNOSIS — E669 Obesity, unspecified: Secondary | ICD-10-CM | POA: Diagnosis not present

## 2017-03-24 DIAGNOSIS — M170 Bilateral primary osteoarthritis of knee: Secondary | ICD-10-CM | POA: Insufficient documentation

## 2017-03-24 DIAGNOSIS — F3289 Other specified depressive episodes: Secondary | ICD-10-CM | POA: Diagnosis not present

## 2017-03-24 DIAGNOSIS — Z87442 Personal history of urinary calculi: Secondary | ICD-10-CM | POA: Diagnosis not present

## 2017-03-24 DIAGNOSIS — F329 Major depressive disorder, single episode, unspecified: Secondary | ICD-10-CM | POA: Diagnosis not present

## 2017-03-24 DIAGNOSIS — I480 Paroxysmal atrial fibrillation: Secondary | ICD-10-CM | POA: Diagnosis not present

## 2017-03-24 DIAGNOSIS — G4733 Obstructive sleep apnea (adult) (pediatric): Secondary | ICD-10-CM | POA: Insufficient documentation

## 2017-03-24 DIAGNOSIS — Z7901 Long term (current) use of anticoagulants: Secondary | ICD-10-CM | POA: Insufficient documentation

## 2017-03-24 DIAGNOSIS — K219 Gastro-esophageal reflux disease without esophagitis: Secondary | ICD-10-CM | POA: Insufficient documentation

## 2017-03-24 DIAGNOSIS — Z87891 Personal history of nicotine dependence: Secondary | ICD-10-CM | POA: Diagnosis not present

## 2017-03-24 DIAGNOSIS — F419 Anxiety disorder, unspecified: Secondary | ICD-10-CM | POA: Insufficient documentation

## 2017-03-24 DIAGNOSIS — E11621 Type 2 diabetes mellitus with foot ulcer: Secondary | ICD-10-CM | POA: Diagnosis not present

## 2017-03-24 DIAGNOSIS — M14679 Charcot's joint, unspecified ankle and foot: Secondary | ICD-10-CM | POA: Diagnosis not present

## 2017-03-24 DIAGNOSIS — E785 Hyperlipidemia, unspecified: Secondary | ICD-10-CM | POA: Insufficient documentation

## 2017-03-24 LAB — BASIC METABOLIC PANEL
Anion gap: 7 (ref 5–15)
BUN: 15 mg/dL (ref 6–20)
CO2: 20 mmol/L — ABNORMAL LOW (ref 22–32)
Calcium: 9.3 mg/dL (ref 8.9–10.3)
Chloride: 109 mmol/L (ref 101–111)
Creatinine, Ser: 1.2 mg/dL — ABNORMAL HIGH (ref 0.44–1.00)
GFR, EST AFRICAN AMERICAN: 54 mL/min — AB (ref >60)
GFR, EST NON AFRICAN AMERICAN: 46 mL/min — AB (ref >60)
Glucose, Bld: 119 mg/dL — ABNORMAL HIGH (ref 65–99)
POTASSIUM: 3.9 mmol/L (ref 3.5–5.1)
SODIUM: 136 mmol/L (ref 135–145)

## 2017-03-24 LAB — MAGNESIUM: MAGNESIUM: 1.8 mg/dL (ref 1.7–2.4)

## 2017-03-24 MED ORDER — POTASSIUM CHLORIDE ER 10 MEQ PO TBCR: 20.0000 meq | EXTENDED_RELEASE_TABLET | Freq: Every day | ORAL | 2 refills | Status: DC

## 2017-03-24 NOTE — Progress Notes (Addendum)
Primary Care Physician: Jarome MatinPaterson, Daniel, MD Referring Physician: Dr. Jeanella Caraaylor   [name] is a 66 y.o. female with a h/o h/o paroxysmal afib that was hospitalized recently for tikosyn administration. PharmD screened drugs and recommendedstopping HCTZ and verapamil. Asenapine was also recommended to stop and was weaned off by PCP and last dose was last Thursday prior to admission..No missed doses of eliquis. She had some issues with HTN while hospitalized, and had a ER visit for headache and HTN 6/27. CT of head was negative. BO is stable today. Pt is c/o of slight hand tremor and some anxious feelings since Tikosyn load.   Today, she denies symptoms of palpitations, chest pain, shortness of breath, orthopnea, PND, lower extremity edema, dizziness, presyncope, syncope, or neurologic sequela. The patient is tolerating medications without difficulties and is otherwise without complaint today.   Past Medical History:  Diagnosis Date  . Anxiety   . 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  PT ALSO HAS LOWER BACK PAIN AT TIMES  . Depression   . GERD (gastroesophageal reflux disease)    RARE- OTC ANTACID IF NEEDED--PAST HX OF ULCER  . Headache    wakes up with headache   . Hyperlipidemia   . Hypertension   . Kidney stones    PT PASSED STONES--NO KNOWN STONES AT PRESENT TIMES  . Lumbago 10/02/2013  . Obesity   . OSA (obstructive sleep apnea) 12/30/2013   Very mild OSA with REM accentuation,  tested 11-29-13 at [facility], Dr Frances FurbishAthar .   Marland Kitchen. Periodic limb movement disorder (PLMD) 12/30/2013  . Restless leg syndrome   . Ulcer   . Urinary incontinence    Past Surgical History:  Procedure Laterality Date  . BILATERAL BUNIONECTOMY 1979    . BREAST SURGERY     biopsy   . COLON SURGERY    . SMALL INTESTINE SURGERY    . SURGERY FOR BOWEL OBSTRUCTION AND REMOVAL OF PART OF STOMACH  IN 2000  2000  . TOTAL KNEE ARTHROPLASTY  02/16/2012   Procedure: TOTAL KNEE ARTHROPLASTY;  Surgeon:  Javier DockerJeffrey C Beane, MD;  Location: [facility];  Service: Orthopedics;  Laterality: Left;  . TOTAL KNEE ARTHROPLASTY Right 11/20/2014   Procedure: RIGHT TOTAL KNEE ARTHROPLASTY;  Surgeon: Javier DockerJeffrey C Beane, MD;  Location: [facility];  Service: Orthopedics;  Laterality: Right;    Current Outpatient Prescriptions  Medication Sig Dispense Refill  . buPROPion (WELLBUTRIN XL) 150 MG 24 hr tablet Take 150 mg by mouth every morning.    . carvedilol (COREG) 12.5 MG tablet Take 1 tablet (12.5 mg total) by mouth 2 (two) times daily with a meal. 180 tablet 1  . cyanocobalamin (,VITAMIN B-12,) 1000 MCG/ML injection Inject 1,000 mcg into the muscle every 30 (thirty) days.    Marland Kitchen. dofetilide (TIKOSYN) 500 MCG capsule Take 1 capsule (500 mcg total) by mouth 2 (two) times daily. 180 capsule 1  . ELIQUIS 5 MG TABS tablet Take 5 mg by mouth 2 (two) times daily.    Marland Kitchen. gabapentin (NEURONTIN) 600 MG tablet Take 600 mg by mouth 4 (four) times daily.    . hydrALAZINE (APRESOLINE) 50 MG tablet Take 1 tablet (50 mg total) by mouth 3 (three) times daily. 270 tablet 3  . iron polysaccharides (NIFEREX) 150 MG capsule Take 150 mg by mouth daily.    . isosorbide mononitrate (IMDUR) 30 MG 24 hr tablet Take 0.5 tablets (15 mg total) by mouth daily. 45 tablet 3  .  lisinopril (PRINIVIL,ZESTRIL) 40 MG tablet Take 1 tablet (40 mg total) by mouth daily. 90 tablet 3  . meclizine (ANTIVERT) 25 MG tablet Take 25 mg by mouth 3 (three) times daily as needed for dizziness.    . Multiple Vitamins-Minerals (MULTIVITAMIN & MINERAL PO) Take 1 tablet by mouth daily.    . potassium chloride (K-DUR) 10 MEQ tablet Take 1 tablet (10 mEq total) by mouth daily. 90 tablet 3  . vortioxetine HBr (TRINTELLIX) 10 MG TABS Take 10 mg by mouth daily.     No current facility-administered medications for this encounter.     Allergies  Allergen Reactions  . Zolpidem Tartrate Other (See Comments)    hallucinations  . Amoxicillin Rash    Social History   Social  History  . Marital status: Single    Spouse name: N/A  . Number of children: 0  . Years of education: 14   Occupational History  . Not on file.   Social History Main Topics  . Smoking status: Former Smoker    Packs/day: 2.00    Years: 20.00    Types: Cigarettes    Quit date: 10/17/1988  . Smokeless tobacco: Never Used  . Alcohol use No  . Drug use: No     Comment: Marijuana - 25 years ago  . Sexual activity: Not on file   Other Topics Concern  . Not on file   Social History Narrative   Patient is single and lives alone.   Patient is disabled.   Patient has a college education.   Patient is right-handed.   Patient drinks two cokes per day.    Family History  Problem Relation Age of Onset  . Sleep apnea Father   . Multiple sclerosis Sister   . Multiple sclerosis Sister     ROS- All systems are reviewed and negative except as per the HPI above  Physical Exam: Vitals:   03/24/17 1056  BP: 138/72  Pulse: 60  Weight: 264 lb 6.4 oz (119.9 kg)  Height: 5\' 4"  (1.626 m)   Wt Readings from Last 3 Encounters:  03/24/17 264 lb 6.4 oz (119.9 kg)  03/14/17 268 lb 1.6 oz (121.6 kg)  03/14/17 268 lb (121.6 kg)    Labs: Lab Results  Component Value Date   NA 141 03/22/2017   K 3.7 03/22/2017   CL 107 03/22/2017   CO2 21 (L) 03/22/2017   GLUCOSE 111 (H) 03/22/2017   BUN 16 03/22/2017   CREATININE 1.10 (H) 03/22/2017   CALCIUM 9.5 03/22/2017   MG 1.9 03/17/2017   Lab Results  Component Value Date   INR 1.11 03/22/2017   No results found for: CHOL, HDL, LDLCALC, TRIG   GEN- The patient is well appearing, alert and oriented x 3 today.   Head- normocephalic, atraumatic Eyes-  Sclera clear, conjunctiva pink Ears- hearing intact Oropharynx- clear Neck- supple, no JVP Lymph- no cervical lymphadenopathy Lungs- Clear to ausculation bilaterally, normal work of breathing Heart- Regular rate and rhythm, no murmurs, rubs or gallops, PMI not laterally displaced GI-  soft, NT, ND, + BS Extremities- no clubbing, cyanosis, or edema MS- no significant deformity or atrophy Skin- no rash or lesion Psych- euthymic mood, full affect Neuro- strength and sensation are intact  EKG- NSR, normal EKG at 60 bpm, pr int 162 ms, qrs int 84 ms, qtc 472 ms(stable) Epic records reviewed    Assessment and Plan: 1.Paroxysmal afib In SR on dofetilide 500 mcg bid Maintaining SR General precautions  with dofetilide again discussed Continue eliquis 5 mg bid Is c/p mild tremors and mild anxiety since tikoyn, this would be a rare side effect, not normally seen She does see PCP this pm, ? Side effect of stopping asenapine, she will discuss Bmet/mag today  2. HTN Taken off hctz/verapamil prior to Tikosyn with elevation of BP Now stable with recent med adjustments No change today  [name] [facility] [address] [place], [place] [address] [phone]

## 2017-03-26 NOTE — Telephone Encounter (Signed)
Increase coreg to 12.5 twice daily, reduce salt, and lose weight. GT

## 2017-03-27 ENCOUNTER — Encounter: Payer: Self-pay | Admitting: Nurse Practitioner

## 2017-03-27 ENCOUNTER — Ambulatory Visit (INDEPENDENT_AMBULATORY_CARE_PROVIDER_SITE_OTHER): Payer: PPO | Admitting: Nurse Practitioner

## 2017-03-27 VITALS — BP 130/52 | HR 70 | Ht 65.75 in | Wt 264.4 lb

## 2017-03-27 DIAGNOSIS — I10 Essential (primary) hypertension: Secondary | ICD-10-CM | POA: Diagnosis not present

## 2017-03-27 DIAGNOSIS — Z7901 Long term (current) use of anticoagulants: Secondary | ICD-10-CM

## 2017-03-27 DIAGNOSIS — I480 Paroxysmal atrial fibrillation: Secondary | ICD-10-CM

## 2017-03-27 MED ORDER — AMLODIPINE BESYLATE 5 MG PO TABS: 5.0000 mg | ORAL_TABLET | Freq: Every day | ORAL | 3 refills | Status: DC

## 2017-03-27 NOTE — Progress Notes (Signed)
CARDIOLOGY OFFICE NOTE  Date:  03/27/2017    [name] Date of Birth: 1951-05-28 Medical Record #[id number]  PCP:  Jarome Matin, MD  Cardiologist:  Skains/Taylor   Chief Complaint  Patient presents with  . Hypertension    Work in visit - seen for Dr. Anne Fu & Dr. Ladona Ridgel    History of Present Illness: JEANIFER HALLIDAY is a 66 y.o. female who presents today for a work in visit. Seen for Dr. Anne Fu.   She has had atrial fibrillation in the setting of HTN heart disease and morbid obesity. She has had symptomatic atrial fib for over a year which has been paroxysmal at times and persistent at times. She has had RVR and a slow VR and with medical therapy has also had sinus bradycardia. She has never had syncope. Other issues include HTN, HLD, & venous insufficiency and is prone to developing ulcers on her legs.  Referred to EP and saw Dr. Ladona Ridgel back in May - Tikosyn was initiated. She had to stop her Asenapine, HCTZ and her Verapamil.   Recent phone calls noting elevated BP. Hydralazine was increased. Imdur started. Coreg also increased.   In the ER last week - migraine and elevated BP noted.   Seen in the AF clinic this past Friday - BP was stable. EKG done. Stable visit noted.   Comes in today. Here alone. She is using a cane and her right leg is in a boot. She notes her morning BP's are quite high - readings are reviewed and they are all high and first thing in the AM. She is taking her medicines. BP improves as the day goes by but her headache continues. Never had chest pain. Breathing is fine.   Past Medical History:  Diagnosis Date  . Anxiety   . 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  PT ALSO HAS LOWER BACK PAIN AT TIMES  . Depression   . GERD (gastroesophageal reflux disease)    RARE- OTC ANTACID IF NEEDED--PAST HX OF ULCER  . Headache    wakes up with headache   . Hyperlipidemia   . Hypertension   . Kidney stones    PT PASSED STONES--NO  KNOWN STONES AT PRESENT TIMES  . Lumbago 10/02/2013  . Obesity   . OSA (obstructive sleep apnea) 12/30/2013   Very mild OSA with REM accentuation,  tested 11-29-13 at [facility] sleep, Dr Frances Furbish .   Marland Kitchen Periodic limb movement disorder (PLMD) 12/30/2013  . Restless leg syndrome   . Ulcer   . Urinary incontinence     Past Surgical History:  Procedure Laterality Date  . BILATERAL BUNIONECTOMY 1979    . BREAST SURGERY     biopsy   . COLON SURGERY    . SMALL INTESTINE SURGERY    . SURGERY FOR BOWEL OBSTRUCTION AND REMOVAL OF PART OF STOMACH  IN 2000  2000  . TOTAL KNEE ARTHROPLASTY  02/16/2012   Procedure: TOTAL KNEE ARTHROPLASTY;  Surgeon: Javier Docker, MD;  Location: [facility];  Service: Orthopedics;  Laterality: Left;  . TOTAL KNEE ARTHROPLASTY Right 11/20/2014   Procedure: RIGHT TOTAL KNEE ARTHROPLASTY;  Surgeon: Javier Docker, MD;  Location: [facility];  Service: Orthopedics;  Laterality: Right;     Medications: Current Meds  Medication Sig  . buPROPion (WELLBUTRIN XL) 150 MG 24 hr tablet Take 150 mg by mouth every morning.  . carvedilol (COREG) 12.5 MG tablet Take 1 tablet (12.5  mg total) by mouth 2 (two) times daily with a meal.  . cyanocobalamin (,VITAMIN B-12,) 1000 MCG/ML injection Inject 1,000 mcg into the muscle every 30 (thirty) days.  Marland Kitchen. dofetilide (TIKOSYN) 500 MCG capsule Take 1 capsule (500 mcg total) by mouth 2 (two) times daily.  Marland Kitchen. ELIQUIS 5 MG TABS tablet Take 5 mg by mouth 2 (two) times daily.  Marland Kitchen. gabapentin (NEURONTIN) 600 MG tablet Take 600 mg by mouth 4 (four) times daily.  . hydrALAZINE (APRESOLINE) 50 MG tablet Take 1 tablet (50 mg total) by mouth 3 (three) times daily.  . iron polysaccharides (NIFEREX) 150 MG capsule Take 150 mg by mouth daily.  Marland Kitchen. lisinopril (PRINIVIL,ZESTRIL) 40 MG tablet Take 1 tablet (40 mg total) by mouth daily.  . meclizine (ANTIVERT) 25 MG tablet Take 25 mg by mouth 3 (three) times daily as needed for dizziness.  . Multiple Vitamins-Minerals  (MULTIVITAMIN & MINERAL PO) Take 1 tablet by mouth daily.  . potassium chloride (K-DUR) 10 MEQ tablet Take 2 tablets (20 mEq total) by mouth daily.  Marland Kitchen. vortioxetine HBr (TRINTELLIX) 10 MG TABS Take 10 mg by mouth daily.  . [DISCONTINUED] isosorbide mononitrate (IMDUR) 30 MG 24 hr tablet Take 0.5 tablets (15 mg total) by mouth daily.     Allergies: Allergies  Allergen Reactions  . Zolpidem Tartrate Other (See Comments)    hallucinations  . Amoxicillin Rash    Social History: The patient  reports that she quit smoking about 28 years ago. Her smoking use included Cigarettes. She has a 40.00 pack-year smoking history. She has never used smokeless tobacco. She reports that she does not drink alcohol or use drugs.   Family History: The patient's family history includes Multiple sclerosis in her sister and sister; Sleep apnea in her father.   Review of Systems: Please see the history of present illness.   Otherwise, the review of systems is positive for none.   All other systems are reviewed and negative.   Physical Exam: VS:  BP (!) 130/52 (BP Location: Left Arm, Patient Position: Sitting, Cuff Size: Large)   Pulse 70   Ht 5' 5.75" (1.67 m)   Wt 264 lb 6.4 oz (119.9 kg)   LMP  (LMP Unknown)   SpO2 99% Comment: at rest  BMI 43.00 kg/m  .  BMI Body mass index is 43 kg/m.  Wt Readings from Last 3 Encounters:  03/27/17 264 lb 6.4 oz (119.9 kg)  03/24/17 264 lb 6.4 oz (119.9 kg)  03/14/17 268 lb 1.6 oz (121.6 kg)    General: Pleasant. Obese. Alert and in no acute distress.   HEENT: Normal.  Neck: Supple, no JVD, carotid bruits, or masses noted.  Cardiac: Regular rate and rhythm. Soft outflow murmur noted. No edema.  Respiratory:  Lungs are clear to auscultation bilaterally with normal work of breathing.  GI: Soft and nontender.  MS: No deformity or atrophy. Gait and ROM intact.  Skin: Warm and dry. Color is normal.  Neuro:  Strength and sensation are intact and no gross focal  deficits noted.  Psych: Alert, appropriate and with normal affect.   LABORATORY DATA:  EKG:  EKG is not ordered today.  Lab Results  Component Value Date   WBC 9.5 03/22/2017   HGB 13.6 03/22/2017   HCT 40.0 03/22/2017   PLT 306 03/22/2017   GLUCOSE 119 (H) 03/24/2017   ALT 14 03/22/2017   AST 20 03/22/2017   NA 136 03/24/2017   K 3.9 03/24/2017  CL 109 03/24/2017   CREATININE 1.20 (H) 03/24/2017   BUN 15 03/24/2017   CO2 20 (L) 03/24/2017   TSH 1.367 11/24/2014   INR 1.11 03/22/2017   HGBA1C 5.9 (H) 02/16/2012     BNP (last 3 results) No results for input(s): BNP in the last 8760 hours.  ProBNP (last 3 results) No results for input(s): PROBNP in the last 8760 hours.   Other Studies Reviewed Today:  Myoview Study Highlights 2016   Nuclear stress EF: 60%.  There was no ST segment deviation noted during stress.  Defect 1: There is a small defect of mild severity present in the mid anterior and apical anterior location. This is likely breast attenuation, but cannot rule out infarct with mild peri-infarct ischemia.  The study is normal.  This is a low risk study.  The left ventricular ejection fraction is normal (55-65%).     Echo Study Conclusions 2016  - Left ventricle: The cavity size was normal. Systolic function was normal. The estimated ejection fraction was in the range of 50% to 55%. Wall motion was normal; there were no regional wall motion abnormalities. - Mitral valve: There was mild regurgitation. - Left atrium: The atrium was mildly dilated. - Right atrium: The atrium was mildly dilated.  Assessment/Plan:  1. HTN - recent med changes as noted above. I suspect her persistent headache is from the nitrate - discussed with Dr. Ladona Ridgel here in the office - will add Norvasc at bedtime. Could increase night time dose of Coreg or the Hydralazine as well. She would like to come back here in a week for a recheck. Will see Dr. Ladona Ridgel later this  month as well and will get her back with Dr. Anne Fu in a couple of months.   2. PAF - now on Tikosyn - remains in NSR - just seen Friday in the AF clinic.   3. Chronic anticoagulation - no problems noted.   4. Morbid obesity  5. Chronic venous insufficiency  Current medicines are reviewed with the patient today.  The patient does not have concerns regarding medicines other than what has been noted above.  The following changes have been made:  See above.  Labs/ tests ordered today include:   No orders of the defined types were placed in this encounter.    Disposition:   FU with Dr. Ladona Ridgel as planned later this month. Need to get her back to Dr. Anne Fu as well.     Patient is agreeable to this plan and will call if any problems develop in the interim.   SignedNorma Fredrickson, NP  03/27/2017 1:55 PM  [facility] [address] [place], [place]  [address] Phone: [phone] Fax: [phone]

## 2017-03-27 NOTE — Telephone Encounter (Signed)
Patient seen in the office today by Norma FredricksonLori Gerhardt, NP and needs were addressed.

## 2017-03-27 NOTE — Patient Instructions (Addendum)
We will be checking the following labs today - NONE   Medication Instructions:    Continue with your current medicines. BUT  I am stopping Imdur  I am adding Norvasc 5 mg to take at bedtime every night - this is at the drug store - start tonight    Testing/Procedures To Be Arranged:  N/A  Follow-Up:   See me in a week - bring your BP cuff with you    Other Special Instructions:   N/A    If you need a refill on your cardiac medications before your next appointment, please call your pharmacy.   Call the [facility] office at [phone] if you have any questions, problems or concerns.

## 2017-03-30 DIAGNOSIS — M14671 Charcot's joint, right ankle and foot: Secondary | ICD-10-CM | POA: Diagnosis not present

## 2017-03-30 DIAGNOSIS — L97523 Non-pressure chronic ulcer of other part of left foot with necrosis of muscle: Secondary | ICD-10-CM | POA: Diagnosis not present

## 2017-03-30 DIAGNOSIS — M21612 Bunion of left foot: Secondary | ICD-10-CM | POA: Diagnosis not present

## 2017-03-31 ENCOUNTER — Other Ambulatory Visit: Payer: Self-pay | Admitting: Student

## 2017-03-31 ENCOUNTER — Ambulatory Visit
Admission: RE | Admit: 2017-03-31 | Discharge: 2017-03-31 | Disposition: A | Discharge status: Home / Self Care | Payer: PPO | Source: Ambulatory Visit | Attending: Student | Admitting: Student

## 2017-03-31 DIAGNOSIS — L97523 Non-pressure chronic ulcer of other part of left foot with necrosis of muscle: Secondary | ICD-10-CM

## 2017-03-31 DIAGNOSIS — M19072 Primary osteoarthritis, left ankle and foot: Secondary | ICD-10-CM | POA: Diagnosis not present

## 2017-04-03 ENCOUNTER — Encounter: Payer: Self-pay | Admitting: Nurse Practitioner

## 2017-04-03 ENCOUNTER — Ambulatory Visit (INDEPENDENT_AMBULATORY_CARE_PROVIDER_SITE_OTHER): Payer: PPO | Admitting: Nurse Practitioner

## 2017-04-03 VITALS — BP 180/80 | HR 62 | Ht 64.75 in | Wt 261.8 lb

## 2017-04-03 DIAGNOSIS — I480 Paroxysmal atrial fibrillation: Secondary | ICD-10-CM

## 2017-04-03 DIAGNOSIS — I10 Essential (primary) hypertension: Secondary | ICD-10-CM

## 2017-04-03 DIAGNOSIS — Z7901 Long term (current) use of anticoagulants: Secondary | ICD-10-CM

## 2017-04-03 MED ORDER — HYDRALAZINE HCL 50 MG PO TABS: 75.0000 mg | ORAL_TABLET | Freq: Three times a day (TID) | ORAL | 3 refills | Status: DC

## 2017-04-03 NOTE — Patient Instructions (Addendum)
We will be checking the following labs today - NONE   Medication Instructions:    Continue with your current medicines. BUT  I am increasing the Hydralazine to 75 mg to take three times a day - this will be a pill and a half 3 times a day  Dr. Ladona Ridgelaylor has said it is ok to take Clonazepam for anxiety and should be ok with your Tikosyn    Testing/Procedures To Be Arranged:  N/A  Follow-Up:   See Dr.Skains & Ladona Ridgelaylor as planned.  HTN clinic in one month    Other Special Instructions:   Monitor your BP = different times of day and keep a record    If you need a refill on your cardiac medications before your next appointment, please call your pharmacy.   Call the [facility] office at [phone] if you have any questions, problems or concerns.

## 2017-04-03 NOTE — Progress Notes (Signed)
CARDIOLOGY OFFICE NOTE  Date:  04/03/2017    [name] Date of Birth: 09-08-1951 Medical Record #[id number]  PCP:  Jarome Matin, MD  Cardiologist:  Rick Duff    Chief Complaint  Patient presents with  . Hypertension    Follow up visit - seen for Dr. Rosine Abe    History of Present Illness: [name] is a 66 y.o. female who presents today for a one week check. Seen for Dr. Anne Fu.   She has had atrial fibrillation in the setting of HTN heart disease and morbid obesity. She has had symptomatic atrial fib for over a year which has been paroxysmal at times and persistent at times. She has had RVR and a slow VR and with medical therapy has also had sinus bradycardia. She has never had syncope. Other issues include HTN, HLD, & venous insufficiency and is prone to developing ulcers on her legs.  Referred to EP and saw Dr. Ladona Ridgel back in May - Tikosyn was initiated. She had to stop her Asenapine, HCTZ and her Verapamil.   Recent phone calls noting elevated BP. Hydralazine was increased. Imdur started. Coreg also increased.   In the ER about 2 weeks ago  - migraine and elevated BP noted. Then seen in the AF clinic - BP was stable. EKG ok.   I then saw her back - early morning BP's pretty high - significant headache. I added night time Norvasc and stopped the Imdur. She wanted to come back for early follow up.   Comes in today. Here alone. Her cuff matches up pretty well. Asking about taking Clonazepam with her Tikosyn (she has a bottle from 4 years ago) - I do not see where this could interact. She notes she is having more issues with her anxiety and is asking about restarting prn. BP still up in the mornings but not as high as it was previously. She does not really check it any other times but is high here this afternoon. Readings from home noted - HR in the 50 to low 60's. Headache is gone. Her dog died the day she was last here. No chest pain. Rare  palpitations.   Past Medical History:  Diagnosis Date  . Anxiety   . 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  PT ALSO HAS LOWER BACK PAIN AT TIMES  . Depression   . GERD (gastroesophageal reflux disease)    RARE- OTC ANTACID IF NEEDED--PAST HX OF ULCER  . Headache    wakes up with headache   . Hyperlipidemia   . Hypertension   . Kidney stones    PT PASSED STONES--NO KNOWN STONES AT PRESENT TIMES  . Lumbago 10/02/2013  . Obesity   . OSA (obstructive sleep apnea) 12/30/2013   Very mild OSA with REM accentuation,  tested 11-29-13 at [facility] sleep, Dr Frances Furbish .   Marland Kitchen Periodic limb movement disorder (PLMD) 12/30/2013  . Restless leg syndrome   . Ulcer   . Urinary incontinence     Past Surgical History:  Procedure Laterality Date  . BILATERAL BUNIONECTOMY 1979    . BREAST SURGERY     biopsy   . COLON SURGERY    . SMALL INTESTINE SURGERY    . SURGERY FOR BOWEL OBSTRUCTION AND REMOVAL OF PART OF STOMACH  IN 2000  2000  . TOTAL KNEE ARTHROPLASTY  02/16/2012   Procedure: TOTAL KNEE ARTHROPLASTY;  Surgeon: Javier Docker, MD;  Location: [facility];  Service: Orthopedics;  Laterality: Left;  . TOTAL KNEE ARTHROPLASTY Right 11/20/2014   Procedure: RIGHT TOTAL KNEE ARTHROPLASTY;  Surgeon: Javier DockerJeffrey C Beane, MD;  Location: [facility];  Service: Orthopedics;  Laterality: Right;     Medications: Current Meds  Medication Sig  . amLODipine (NORVASC) 5 MG tablet Take 1 tablet (5 mg total) by mouth daily.  Marland Kitchen. buPROPion (WELLBUTRIN XL) 150 MG 24 hr tablet Take 150 mg by mouth every morning.  . carvedilol (COREG) 12.5 MG tablet Take 1 tablet (12.5 mg total) by mouth 2 (two) times daily with a meal.  . cyanocobalamin (,VITAMIN B-12,) 1000 MCG/ML injection Inject 1,000 mcg into the muscle every 30 (thirty) days.  Marland Kitchen. dofetilide (TIKOSYN) 500 MCG capsule Take 1 capsule (500 mcg total) by mouth 2 (two) times daily.  Marland Kitchen. ELIQUIS 5 MG TABS tablet Take 5 mg by mouth 2 (two) times daily.  Marland Kitchen. gabapentin (NEURONTIN)  600 MG tablet Take 600 mg by mouth 4 (four) times daily.  . iron polysaccharides (NIFEREX) 150 MG capsule Take 150 mg by mouth daily.  Marland Kitchen. lisinopril (PRINIVIL,ZESTRIL) 40 MG tablet Take 1 tablet (40 mg total) by mouth daily.  . meclizine (ANTIVERT) 25 MG tablet Take 25 mg by mouth 3 (three) times daily as needed for dizziness.  . Multiple Vitamins-Minerals (MULTIVITAMIN & MINERAL PO) Take 1 tablet by mouth daily.  . potassium chloride (K-DUR) 10 MEQ tablet Take 2 tablets (20 mEq total) by mouth daily.  Marland Kitchen. vortioxetine HBr (TRINTELLIX) 10 MG TABS Take 10 mg by mouth daily.  . [DISCONTINUED] hydrALAZINE (APRESOLINE) 50 MG tablet Take 1 tablet (50 mg total) by mouth 3 (three) times daily.     Allergies: Allergies  Allergen Reactions  . Zolpidem Tartrate Other (See Comments)    hallucinations  . Amoxicillin Rash    Social History: The patient  reports that she quit smoking about 28 years ago. Her smoking use included Cigarettes. She has a 40.00 pack-year smoking history. She has never used smokeless tobacco. She reports that she does not drink alcohol or use drugs.   Family History: The patient's family history includes Multiple sclerosis in her sister and sister; Sleep apnea in her father.   Review of Systems: Please see the history of present illness.   Otherwise, the review of systems is positive for none.   All other systems are reviewed and negative.   Physical Exam: VS:  BP (!) 180/80 (BP Location: Left Arm, Patient Position: Sitting, Cuff Size: Large)   Pulse 62   Ht 5' 4.75" (1.645 m)   Wt 261 lb 12.8 oz (118.8 kg)   LMP  (LMP Unknown)   SpO2 98% Comment: at rest  BMI 43.90 kg/m  .  BMI Body mass index is 43.9 kg/m.  Wt Readings from Last 3 Encounters:  04/03/17 261 lb 12.8 oz (118.8 kg)  03/27/17 264 lb 6.4 oz (119.9 kg)  03/24/17 264 lb 6.4 oz (119.9 kg)   Her cuff read 186/94  General: Pleasant. Obese. She is alert and in no acute distress.   HEENT: Normal.  Neck:  Supple, no JVD, carotid bruits, or masses noted.  Cardiac: Regular rate and rhythm. No murmurs, rubs, or gallops. No edema.  Respiratory:  Lungs are clear to auscultation bilaterally with normal work of breathing.  GI: Soft and nontender.  MS: No deformity or atrophy. Gait and ROM intact.  Skin: Warm and dry. Color is normal.  Neuro:  Strength and sensation are intact and no  gross focal deficits noted.  Psych: Alert, appropriate and with normal affect.   LABORATORY DATA:  EKG:  EKG is not ordered today.  Lab Results  Component Value Date   WBC 9.5 03/22/2017   HGB 13.6 03/22/2017   HCT 40.0 03/22/2017   PLT 306 03/22/2017   GLUCOSE 119 (H) 03/24/2017   ALT 14 03/22/2017   AST 20 03/22/2017   NA 136 03/24/2017   K 3.9 03/24/2017   CL 109 03/24/2017   CREATININE 1.20 (H) 03/24/2017   BUN 15 03/24/2017   CO2 20 (L) 03/24/2017   TSH 1.367 11/24/2014   INR 1.11 03/22/2017   HGBA1C 5.9 (H) 02/16/2012     BNP (last 3 results) No results for input(s): BNP in the last 8760 hours.  ProBNP (last 3 results) No results for input(s): PROBNP in the last 8760 hours.   Other Studies Reviewed Today:  Myoview Study Highlights 2016   Nuclear stress EF: 60%.  There was no ST segment deviation noted during stress.  Defect 1: There is a small defect of mild severity present in the mid anterior and apical anterior location. This is likely breast attenuation, but cannot rule out infarct with mild peri-infarct ischemia.  The study is normal.  This is a low risk study.  The left ventricular ejection fraction is normal (55-65%).    Echo Study Conclusions 2016  - Left ventricle: The cavity size was normal. Systolic function was normal. The estimated ejection fraction was in the range of 50% to 55%. Wall motion was normal; there were no regional wall motion abnormalities. - Mitral valve: There was mild regurgitation. - Left atrium: The atrium was mildly dilated. - Right  atrium: The atrium was mildly dilated.  Assessment/Plan:  1. HTN - recent med changes as noted above. Some improvement with the additional of Norvasc. Increasing Hydralazine today to 75 mg TID. Will get her to see the HTN clinic in a month. I have asked her to monitor readings at home.   2. PAF - now on Tikosyn - remains in NSR - just seen on the 29th of June in the AF clinic   3. Chronic anticoagulation - no problems noted.   4. Morbid obesity  5. Chronic venous insufficiency  6. Anxiety - saw Dr. Ladona Ridgel here in the office. He is ok with her using prn Clonazepam - no interaction with Tikosyn noted in [facility].    Current medicines are reviewed with the patient today.  The patient does not have concerns regarding medicines other than what has been noted above.  The following changes have been made:  See above.  Labs/ tests ordered today include:   No orders of the defined types were placed in this encounter.    Disposition:   FU with Dr. Anne Fu & Dr. Ladona Ridgel as planned. HTN clinic in one month.   Patient is agreeable to this plan and will call if any problems develop in the interim.   SignedNorma Fredrickson, NP  04/03/2017 2:44 PM  [facility] [address] [place], [place]  [address] Phone: [phone] Fax: [phone]

## 2017-04-04 DIAGNOSIS — Z6841 Body Mass Index (BMI) 40.0 and over, adult: Secondary | ICD-10-CM | POA: Diagnosis not present

## 2017-04-04 DIAGNOSIS — I480 Paroxysmal atrial fibrillation: Secondary | ICD-10-CM | POA: Diagnosis not present

## 2017-04-04 DIAGNOSIS — I10 Essential (primary) hypertension: Secondary | ICD-10-CM | POA: Diagnosis not present

## 2017-04-07 DIAGNOSIS — M8580 Other specified disorders of bone density and structure, unspecified site: Secondary | ICD-10-CM | POA: Diagnosis not present

## 2017-04-07 DIAGNOSIS — M859 Disorder of bone density and structure, unspecified: Secondary | ICD-10-CM | POA: Diagnosis not present

## 2017-04-10 DIAGNOSIS — L97521 Non-pressure chronic ulcer of other part of left foot limited to breakdown of skin: Secondary | ICD-10-CM | POA: Diagnosis not present

## 2017-04-10 DIAGNOSIS — M2022 Hallux rigidus, left foot: Secondary | ICD-10-CM | POA: Diagnosis not present

## 2017-04-10 DIAGNOSIS — M2012 Hallux valgus (acquired), left foot: Secondary | ICD-10-CM | POA: Diagnosis not present

## 2017-04-18 ENCOUNTER — Ambulatory Visit: Payer: PPO | Admitting: Internal Medicine

## 2017-04-28 DIAGNOSIS — Z6841 Body Mass Index (BMI) 40.0 and over, adult: Secondary | ICD-10-CM | POA: Diagnosis not present

## 2017-04-28 DIAGNOSIS — I480 Paroxysmal atrial fibrillation: Secondary | ICD-10-CM | POA: Diagnosis not present

## 2017-04-28 DIAGNOSIS — R21 Rash and other nonspecific skin eruption: Secondary | ICD-10-CM | POA: Diagnosis not present

## 2017-04-28 DIAGNOSIS — F3289 Other specified depressive episodes: Secondary | ICD-10-CM | POA: Diagnosis not present

## 2017-04-28 DIAGNOSIS — L97529 Non-pressure chronic ulcer of other part of left foot with unspecified severity: Secondary | ICD-10-CM | POA: Diagnosis not present

## 2017-04-28 DIAGNOSIS — M545 Low back pain: Secondary | ICD-10-CM | POA: Diagnosis not present

## 2017-04-28 DIAGNOSIS — E11621 Type 2 diabetes mellitus with foot ulcer: Secondary | ICD-10-CM | POA: Diagnosis not present

## 2017-04-28 DIAGNOSIS — M14679 Charcot's joint, unspecified ankle and foot: Secondary | ICD-10-CM | POA: Diagnosis not present

## 2017-04-28 DIAGNOSIS — I7389 Other specified peripheral vascular diseases: Secondary | ICD-10-CM | POA: Diagnosis not present

## 2017-05-10 DIAGNOSIS — F4310 Post-traumatic stress disorder, unspecified: Secondary | ICD-10-CM | POA: Diagnosis not present

## 2017-05-10 DIAGNOSIS — F411 Generalized anxiety disorder: Secondary | ICD-10-CM | POA: Diagnosis not present

## 2017-05-10 DIAGNOSIS — F319 Bipolar disorder, unspecified: Secondary | ICD-10-CM | POA: Diagnosis not present

## 2017-05-17 ENCOUNTER — Ambulatory Visit (INDEPENDENT_AMBULATORY_CARE_PROVIDER_SITE_OTHER): Payer: PPO | Admitting: Pharmacist

## 2017-05-17 ENCOUNTER — Encounter: Payer: Self-pay | Admitting: Pharmacist

## 2017-05-17 VITALS — BP 148/62 | HR 51

## 2017-05-17 DIAGNOSIS — I10 Essential (primary) hypertension: Secondary | ICD-10-CM

## 2017-05-17 MED ORDER — AMLODIPINE BESYLATE 10 MG PO TABS: 10.0000 mg | ORAL_TABLET | Freq: Every day | ORAL | 1 refills | Status: DC

## 2017-05-17 NOTE — Patient Instructions (Addendum)
It was good seeing you today.  Your blood pressure is above your goal but is improving.   Increase your amlodipine dose to 10 mg 1 tablet by mouth once daily before bedtime.   Continue your other medications as directed.   Continue to monitor blood pressure at home. Try to cut back on sodium and soda consumption.   If you notice your blood pressure increasing or decreasing persistently or if you experience increased dizziness, lightheadedness, or swelling, please contact the clinic at [phone].  Follow up in clinic in 3 weeks on 9/13 at 2:00 PM for blood pressure evaluation.

## 2017-05-17 NOTE — Progress Notes (Signed)
Patient ID: ASHARI MORREALE                 DOB: [birth year]                      MRN: [id number]     HPI: MONYE RAWLINGS is a 66 y.o. female pt of Dr. Anne Fu referred by Norma Fredrickson NP to HTN clinic. PMH is significant for PAF, bipolar disorder, obesity, HTN, GERD, and OSA. Pt was initiated on Tikosyn in June, and HCTZ and verapamil were stopped. On 6/27, pt called complaining of headaches, nausea, and elevated BP in the 200s/100s. Carvedilol dose was increased to 12.5 mg BID, hydralazine increased to 50 mg TID, and imdur 15 mg daily was started. She was seen in office by Lawson Fiscal for HTN on 7/2 and noted that her home BP has been high in the mornings w/ significant headaches. Imdur 15 mg daily was stopped, and amlodipine 5 mg daily was added. She followed up in office 1 week later, and reported her readings have come down and headaches are now gone. BP was 180/80 in the office, and her hydralazine dose was increased to 75 mg TID. Pt was referred to HTN clinic for management.   Pt presents to clinic in good spirits. She reports that she is tolerating her current antihypertensive regimen well. She has occasional headaches when BP is elevated but much less frequently than before. Denies dizziness, lightheadedness, or falls. States that she feels somewhat fatigued but does not think it's due to medication changes. Says her left leg is usually somewhat swollen and it's her baseline. No reported additional swelling on amlodipine 5 mg. Home readings tend to be higher in the mornings and goes down as the day progresses. HR tends to run low. HR in the clinic is 51, and BP is 148/62 mmHg.   Current HTN meds:  amlodipine 5 mg daily (10pm) carvedilol 12.5 mg BID (8:30am and 10pm) hydralazine 75 mg TID (8:30am, 2pm, 10pm) lisinopril 40 mg daily (8:30am)  Previously tried: Lopressor - hair loss  BP goal: <130/80 mmHg  Family History: The patient's family history includes Multiple sclerosis in her sister and  sister; Sleep apnea in her father  Social History: Pt is a former smoker, quit in 8. Denies alcohol or illicit use.   Diet: For breakfast may have eggs, oatmeal, or sausage biscuit. For lunch, eats tuna or hamburger. For dinner, eats TV dinners. Likes fruits and vegetables. Trying to cut back on adding sodium to food. No coffee or tea. Usually has 1 soda a day.   Exercise: Minimal due to having a boot and bad balance. Walks around the house.   Home BP readings: In the mornings, readings are usually 150s to 170s, some values in the 180s/60s to 70s. Afternoon and evening values are more controlled in the 130s to 150s/60s to 70s.   Wt Readings from Last 3 Encounters:  04/03/17 261 lb 12.8 oz (118.8 kg)  03/27/17 264 lb 6.4 oz (119.9 kg)  03/24/17 264 lb 6.4 oz (119.9 kg)   BP Readings from Last 3 Encounters:  04/03/17 (!) 180/80  03/27/17 (!) 130/52  03/24/17 138/72   Pulse Readings from Last 3 Encounters:  04/03/17 62  03/27/17 70  03/24/17 60    Renal function: CrCl cannot be calculated (Patient's most recent lab result is older than the maximum 21 days allowed.).  Past Medical History:  Diagnosis Date  . Anxiety   . Arthritis  OA AND PAIN IN BOTH KNEES-LEFT WORSE.  PT ALSO HAS LOWER BACK PAIN AT TIMES  . Depression   . GERD (gastroesophageal reflux disease)    RARE- OTC ANTACID IF NEEDED--PAST HX OF ULCER  . Headache    wakes up with headache   . Hyperlipidemia   . Hypertension   . Kidney stones    PT PASSED STONES--NO KNOWN STONES AT PRESENT TIMES  . Lumbago 10/02/2013  . Obesity   . OSA (obstructive sleep apnea) 12/30/2013   Very mild OSA with REM accentuation,  tested 11-29-13 at [facility] sleep, Dr Frances Furbish .   Marland Kitchen Periodic limb movement disorder (PLMD) 12/30/2013  . Restless leg syndrome   . Ulcer   . Urinary incontinence     Current Outpatient Prescriptions on File Prior to Visit  Medication Sig Dispense Refill  . amLODipine (NORVASC) 5 MG tablet Take 1 tablet (5  mg total) by mouth daily. 90 tablet 3  . buPROPion (WELLBUTRIN XL) 150 MG 24 hr tablet Take 150 mg by mouth every morning.    . carvedilol (COREG) 12.5 MG tablet Take 1 tablet (12.5 mg total) by mouth 2 (two) times daily with a meal. 180 tablet 1  . cyanocobalamin (,VITAMIN B-12,) 1000 MCG/ML injection Inject 1,000 mcg into the muscle every 30 (thirty) days.    Marland Kitchen dofetilide (TIKOSYN) 500 MCG capsule Take 1 capsule (500 mcg total) by mouth 2 (two) times daily. 180 capsule 1  . ELIQUIS 5 MG TABS tablet Take 5 mg by mouth 2 (two) times daily.    Marland Kitchen gabapentin (NEURONTIN) 600 MG tablet Take 600 mg by mouth 4 (four) times daily.    . hydrALAZINE (APRESOLINE) 50 MG tablet Take 1.5 tablets (75 mg total) by mouth 3 (three) times daily. 405 tablet 3  . iron polysaccharides (NIFEREX) 150 MG capsule Take 150 mg by mouth daily.    Marland Kitchen lisinopril (PRINIVIL,ZESTRIL) 40 MG tablet Take 1 tablet (40 mg total) by mouth daily. 90 tablet 3  . meclizine (ANTIVERT) 25 MG tablet Take 25 mg by mouth 3 (three) times daily as needed for dizziness.    . Multiple Vitamins-Minerals (MULTIVITAMIN & MINERAL PO) Take 1 tablet by mouth daily.    . potassium chloride (K-DUR) 10 MEQ tablet Take 2 tablets (20 mEq total) by mouth daily. 180 tablet 2  . vortioxetine HBr (TRINTELLIX) 10 MG TABS Take 10 mg by mouth daily.     No current facility-administered medications on file prior to visit.     Allergies  Allergen Reactions  . Zolpidem Tartrate Other (See Comments)    hallucinations  . Amoxicillin Rash     Assessment/Plan: Hypertension: Pt's BP today is above her goal of <130/80 mmHg. Increase amlodipine dose to 10 mg daily before bedtime. Continue all other antihypertensive medications as prescribed. Discussed lifestyle modifications such as decreasing sodium intake and soda consumption. F/u with patient in clinic in 3 weeks.   -Durward Mallard, PharmD Student    Thank you, Freddie Apley. Cleatis Polka, PharmD  [facility]  [address] [place], [place] [address]  Phone: [phone]; Fax: [phone] 05/17/2017 9:40 PM

## 2017-06-05 ENCOUNTER — Emergency Department (HOSPITAL_COMMUNITY): Payer: PPO

## 2017-06-05 ENCOUNTER — Inpatient Hospital Stay (HOSPITAL_COMMUNITY)
Admission: EM | Admit: 2017-06-05 | Discharge: 2017-06-09 | DRG: 603 | Disposition: A | Discharge status: Home Health | Payer: PPO | Attending: Internal Medicine | Admitting: Internal Medicine

## 2017-06-05 ENCOUNTER — Encounter (HOSPITAL_COMMUNITY): Payer: Self-pay | Admitting: *Deleted

## 2017-06-05 DIAGNOSIS — L97521 Non-pressure chronic ulcer of other part of left foot limited to breakdown of skin: Secondary | ICD-10-CM | POA: Diagnosis not present

## 2017-06-05 DIAGNOSIS — I480 Paroxysmal atrial fibrillation: Secondary | ICD-10-CM

## 2017-06-05 DIAGNOSIS — F329 Major depressive disorder, single episode, unspecified: Secondary | ICD-10-CM | POA: Diagnosis present

## 2017-06-05 DIAGNOSIS — L089 Local infection of the skin and subcutaneous tissue, unspecified: Secondary | ICD-10-CM | POA: Diagnosis not present

## 2017-06-05 DIAGNOSIS — Z7901 Long term (current) use of anticoagulants: Secondary | ICD-10-CM | POA: Diagnosis not present

## 2017-06-05 DIAGNOSIS — Z79899 Other long term (current) drug therapy: Secondary | ICD-10-CM

## 2017-06-05 DIAGNOSIS — R609 Edema, unspecified: Secondary | ICD-10-CM | POA: Diagnosis not present

## 2017-06-05 DIAGNOSIS — I10 Essential (primary) hypertension: Secondary | ICD-10-CM | POA: Diagnosis present

## 2017-06-05 DIAGNOSIS — E1122 Type 2 diabetes mellitus with diabetic chronic kidney disease: Secondary | ICD-10-CM | POA: Diagnosis not present

## 2017-06-05 DIAGNOSIS — S91302A Unspecified open wound, left foot, initial encounter: Secondary | ICD-10-CM | POA: Diagnosis not present

## 2017-06-05 DIAGNOSIS — N183 Chronic kidney disease, stage 3 (moderate): Secondary | ICD-10-CM | POA: Diagnosis not present

## 2017-06-05 DIAGNOSIS — M19079 Primary osteoarthritis, unspecified ankle and foot: Secondary | ICD-10-CM | POA: Diagnosis present

## 2017-06-05 DIAGNOSIS — N179 Acute kidney failure, unspecified: Secondary | ICD-10-CM | POA: Diagnosis present

## 2017-06-05 DIAGNOSIS — G4733 Obstructive sleep apnea (adult) (pediatric): Secondary | ICD-10-CM | POA: Diagnosis not present

## 2017-06-05 DIAGNOSIS — E11621 Type 2 diabetes mellitus with foot ulcer: Secondary | ICD-10-CM | POA: Diagnosis not present

## 2017-06-05 DIAGNOSIS — Z96653 Presence of artificial knee joint, bilateral: Secondary | ICD-10-CM | POA: Diagnosis not present

## 2017-06-05 DIAGNOSIS — G2581 Restless legs syndrome: Secondary | ICD-10-CM | POA: Diagnosis present

## 2017-06-05 DIAGNOSIS — Z6841 Body Mass Index (BMI) 40.0 and over, adult: Secondary | ICD-10-CM | POA: Diagnosis not present

## 2017-06-05 DIAGNOSIS — G609 Hereditary and idiopathic neuropathy, unspecified: Secondary | ICD-10-CM | POA: Diagnosis present

## 2017-06-05 DIAGNOSIS — L03116 Cellulitis of left lower limb: Secondary | ICD-10-CM | POA: Diagnosis not present

## 2017-06-05 DIAGNOSIS — T148XXA Other injury of unspecified body region, initial encounter: Secondary | ICD-10-CM | POA: Diagnosis not present

## 2017-06-05 DIAGNOSIS — L97529 Non-pressure chronic ulcer of other part of left foot with unspecified severity: Secondary | ICD-10-CM | POA: Diagnosis present

## 2017-06-05 DIAGNOSIS — I129 Hypertensive chronic kidney disease with stage 1 through stage 4 chronic kidney disease, or unspecified chronic kidney disease: Secondary | ICD-10-CM | POA: Diagnosis not present

## 2017-06-05 DIAGNOSIS — Z87891 Personal history of nicotine dependence: Secondary | ICD-10-CM | POA: Diagnosis not present

## 2017-06-05 DIAGNOSIS — T814XXA Infection following a procedure, initial encounter: Secondary | ICD-10-CM | POA: Diagnosis not present

## 2017-06-05 DIAGNOSIS — Z888 Allergy status to other drugs, medicaments and biological substances status: Secondary | ICD-10-CM | POA: Diagnosis not present

## 2017-06-05 DIAGNOSIS — L03119 Cellulitis of unspecified part of limb: Secondary | ICD-10-CM | POA: Diagnosis present

## 2017-06-05 DIAGNOSIS — F419 Anxiety disorder, unspecified: Secondary | ICD-10-CM | POA: Diagnosis not present

## 2017-06-05 DIAGNOSIS — K219 Gastro-esophageal reflux disease without esophagitis: Secondary | ICD-10-CM | POA: Diagnosis not present

## 2017-06-05 DIAGNOSIS — E785 Hyperlipidemia, unspecified: Secondary | ICD-10-CM | POA: Diagnosis not present

## 2017-06-05 LAB — I-STAT CG4 LACTIC ACID, ED: LACTIC ACID, VENOUS: 1.18 mmol/L (ref 0.5–1.9)

## 2017-06-05 LAB — BASIC METABOLIC PANEL
ANION GAP: 9 (ref 5–15)
BUN: 35 mg/dL — ABNORMAL HIGH (ref 6–20)
CALCIUM: 9.3 mg/dL (ref 8.9–10.3)
CO2: 18 mmol/L — ABNORMAL LOW (ref 22–32)
Chloride: 110 mmol/L (ref 101–111)
Creatinine, Ser: 1.55 mg/dL — ABNORMAL HIGH (ref 0.44–1.00)
GFR, EST AFRICAN AMERICAN: 39 mL/min — AB (ref >60)
GFR, EST NON AFRICAN AMERICAN: 34 mL/min — AB (ref >60)
GLUCOSE: 93 mg/dL (ref 65–99)
Potassium: 4.3 mmol/L (ref 3.5–5.1)
Sodium: 137 mmol/L (ref 135–145)

## 2017-06-05 LAB — CBC
HCT: 35.5 % — ABNORMAL LOW (ref 36.0–46.0)
Hemoglobin: 10.7 g/dL — ABNORMAL LOW (ref 12.0–15.0)
MCH: 27.4 pg (ref 26.0–34.0)
MCHC: 30.1 g/dL (ref 30.0–36.0)
MCV: 91 fL (ref 78.0–100.0)
PLATELETS: 419 10*3/uL — AB (ref 150–400)
RBC: 3.9 MIL/uL (ref 3.87–5.11)
RDW: 16.5 % — AB (ref 11.5–15.5)
WBC: 8.8 10*3/uL (ref 4.0–10.5)

## 2017-06-05 LAB — SEDIMENTATION RATE: SED RATE: 89 mm/h — AB (ref 0–22)

## 2017-06-05 LAB — C-REACTIVE PROTEIN: CRP: 3.7 mg/dL — ABNORMAL HIGH (ref <1.0)

## 2017-06-05 MED ORDER — MECLIZINE HCL 25 MG PO TABS
25.0000 mg | ORAL_TABLET | Freq: Three times a day (TID) | ORAL | Status: DC | PRN
  Filled 2017-06-05 (×2): qty 1

## 2017-06-05 MED ORDER — POLYSACCHARIDE IRON COMPLEX 150 MG PO CAPS
150.0000 mg | ORAL_CAPSULE | Freq: Every day | ORAL | Status: DC
  Administered 2017-06-06 – 2017-06-09 (×4): 150 mg via ORAL
  Filled 2017-06-05 (×4): qty 1

## 2017-06-05 MED ORDER — ACETAMINOPHEN 650 MG RE SUPP: 650.0000 mg | Freq: Four times a day (QID) | RECTAL | Status: DC | PRN

## 2017-06-05 MED ORDER — ONDANSETRON HCL 4 MG PO TABS: 4.0000 mg | ORAL_TABLET | Freq: Four times a day (QID) | ORAL | Status: DC | PRN

## 2017-06-05 MED ORDER — CARVEDILOL 12.5 MG PO TABS
12.5000 mg | ORAL_TABLET | Freq: Two times a day (BID) | ORAL | Status: DC
  Administered 2017-06-06 – 2017-06-09 (×6): 12.5 mg via ORAL
  Filled 2017-06-05 (×7): qty 1

## 2017-06-05 MED ORDER — ACETAMINOPHEN 325 MG PO TABS: 650.0000 mg | ORAL_TABLET | Freq: Four times a day (QID) | ORAL | Status: DC | PRN

## 2017-06-05 MED ORDER — BUPROPION HCL ER (XL) 150 MG PO TB24
150.0000 mg | ORAL_TABLET | Freq: Every morning | ORAL | Status: DC
  Administered 2017-06-06 – 2017-06-09 (×4): 150 mg via ORAL
  Filled 2017-06-05 (×4): qty 1

## 2017-06-05 MED ORDER — SODIUM CHLORIDE 0.9 % IV SOLN
INTRAVENOUS | Status: DC
  Administered 2017-06-05 – 2017-06-08 (×5): via INTRAVENOUS

## 2017-06-05 MED ORDER — DOFETILIDE 500 MCG PO CAPS
500.0000 ug | ORAL_CAPSULE | Freq: Two times a day (BID) | ORAL | Status: DC
  Administered 2017-06-06 – 2017-06-09 (×7): 500 ug via ORAL
  Filled 2017-06-05 (×8): qty 1

## 2017-06-05 MED ORDER — ALBUTEROL SULFATE (2.5 MG/3ML) 0.083% IN NEBU: 2.5000 mg | INHALATION_SOLUTION | RESPIRATORY_TRACT | Status: DC | PRN

## 2017-06-05 MED ORDER — LACTATED RINGERS IV BOLUS (SEPSIS)
1000.0000 mL | Freq: Once | INTRAVENOUS | Status: AC
  Administered 2017-06-05: 1000 mL via INTRAVENOUS

## 2017-06-05 MED ORDER — APIXABAN 5 MG PO TABS
5.0000 mg | ORAL_TABLET | Freq: Two times a day (BID) | ORAL | Status: DC
  Administered 2017-06-06 – 2017-06-09 (×7): 5 mg via ORAL
  Filled 2017-06-05 (×7): qty 1

## 2017-06-05 MED ORDER — VORTIOXETINE HBR 10 MG PO TABS
20.0000 mg | ORAL_TABLET | Freq: Every day | ORAL | Status: DC
  Filled 2017-06-05: qty 2

## 2017-06-05 MED ORDER — VANCOMYCIN HCL IN DEXTROSE 1-5 GM/200ML-% IV SOLN
1000.0000 mg | Freq: Once | INTRAVENOUS | Status: AC
  Administered 2017-06-05: 1000 mg via INTRAVENOUS
  Filled 2017-06-05: qty 200

## 2017-06-05 MED ORDER — HYDRALAZINE HCL 25 MG PO TABS
75.0000 mg | ORAL_TABLET | Freq: Three times a day (TID) | ORAL | Status: DC
  Administered 2017-06-06 – 2017-06-09 (×9): 75 mg via ORAL
  Filled 2017-06-05 (×3): qty 1
  Filled 2017-06-05: qty 3
  Filled 2017-06-05 (×7): qty 1

## 2017-06-05 MED ORDER — POTASSIUM CHLORIDE CRYS ER 10 MEQ PO TBCR
20.0000 meq | EXTENDED_RELEASE_TABLET | Freq: Every day | ORAL | Status: DC
  Administered 2017-06-06 – 2017-06-09 (×4): 20 meq via ORAL
  Filled 2017-06-05 (×4): qty 2

## 2017-06-05 MED ORDER — LISINOPRIL 40 MG PO TABS
40.0000 mg | ORAL_TABLET | Freq: Every day | ORAL | Status: DC
Start: 2017-06-06 — End: 2017-06-06
  Administered 2017-06-06: 40 mg via ORAL
  Filled 2017-06-05: qty 1

## 2017-06-05 MED ORDER — GABAPENTIN 600 MG PO TABS
600.0000 mg | ORAL_TABLET | Freq: Four times a day (QID) | ORAL | Status: DC
  Administered 2017-06-06 – 2017-06-09 (×14): 600 mg via ORAL
  Filled 2017-06-05 (×13): qty 1

## 2017-06-05 MED ORDER — ONDANSETRON HCL 4 MG/2ML IJ SOLN: 4.0000 mg | Freq: Four times a day (QID) | INTRAMUSCULAR | Status: DC | PRN

## 2017-06-05 NOTE — ED Provider Notes (Signed)
I saw and evaluated the patient, reviewed the resident's note and I agree with the findings and plan.   EKG Interpretation None     Patient injured her left small toe 2 weeks ago. 3 days ago she noticed that it was very painful and getting swollen. She was started doxycycline. She reports that the redness however has increased significantly across the top of her foot. There is also no drainage from the site of the wound. She reports due to peripheral neuropathy she had not really noticed pain with it. No associated fever chills or nausea or malaise. Patient is alert and nontoxic. No respiratory distress. Lungs clear. Heart regular. Examination of the left lower extremity shows an ulcerated draining wound on the lateral aspect of the fifth distal metatarsal. Erythema spreads across the forefoot which is blanching. Erythema also spreads distally towards the ankle. There is ecchymotic discoloration of the fifth digit.  Patient is alert and nontoxic. She is however having worsening foot infection with active purulent drainage. Also consistent with spreading cellulitis. At this time x-ray does not show acute osteomyelitis. I agree with plan and management.   Arby BarrettePfeiffer, Aribella Vavra, MD 06/05/17 2145

## 2017-06-05 NOTE — ED Notes (Signed)
Patient transported to X-ray 

## 2017-06-05 NOTE — H&P (Signed)
History and Physical    [name]:[id number] DOB: 1951/09/23 DOA: 06/05/2017  Referring MD/NP/PA:  Nyra Jabs, MD(resident) PCP: Jarome Matin, MD  Patient coming from:  Home   Chief Complaint: Wound on my foot  HPI: [name] is a 66 y.o. female with medical history significant of HTN, HLD, peripheral neuropathy, and arthritis; who presents with complaints of worsening wound infection on her left foot. Symptoms started 9 days ago after she noticed that a piece of skin on the lateral aspect of her left foot was hanging off. She reports poor sensation and does not currently recall any specific trauma to cause the symptoms. However, when she noticed wound and she removed the skin was hanging on and start placing Band-Aids over the area. However 6 days ago she noticed that she started to develop redness around the toe and called her told her primary care physician. He prescribed her doxycycline and she had been taking this ever since, but reports that the erythema had spread across her foot. Associated symptoms include increased swelling of fluid and bloody looking discharge from the wound. Denies having any fever, chills, nausea, vomiting, abdominal pain, falls. She is followed by Dr. Victorino Dike of [facility] orthopedics in outpatient setting already. Due to symptoms not appearing to improve she was advised to come to the emergency department for further evaluation.   ED Course:  Upon admission into the emergency department patient was noted to have significant erythema and drainage of the lateral aspect of her left foot. Labs revealedCRP 3.7, BUN 35, and creatinine 1.55. X-rays of the left foot shows significant soft tissue swelling but no significant signs of osteomyelitis. Initial started on empiric antibiotics of vancomycin.  Review of Systems  Constitutional: Negative for chills and fever.  HENT: Negative for ear discharge and nosebleeds.   Eyes: Negative for photophobia and  pain.  Respiratory: Negative for cough and shortness of breath.   Cardiovascular: Negative for chest pain and palpitations.  Gastrointestinal: Negative for abdominal pain, nausea and vomiting.  Genitourinary: Negative for hematuria and urgency.  Musculoskeletal: Negative for falls.       Left foot swelling  Skin: Positive for rash. Negative for itching.  Neurological: Positive for sensory change (Chronic). Negative for dizziness and focal weakness.  Endo/Heme/Allergies: Negative for environmental allergies and polydipsia.  Psychiatric/Behavioral: Negative for substance abuse and suicidal ideas.    Past Medical History:  Diagnosis Date  . Anxiety   . 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  PT ALSO HAS LOWER BACK PAIN AT TIMES  . Depression   . GERD (gastroesophageal reflux disease)    RARE- OTC ANTACID IF NEEDED--PAST HX OF ULCER  . Headache    wakes up with headache   . Hyperlipidemia   . Hypertension   . Kidney stones    PT PASSED STONES--NO KNOWN STONES AT PRESENT TIMES  . Lumbago 10/02/2013  . Obesity   . OSA (obstructive sleep apnea) 12/30/2013   Very mild OSA with REM accentuation,  tested 11-29-13 at [facility] sleep, Dr Frances Furbish .   Marland Kitchen Periodic limb movement disorder (PLMD) 12/30/2013  . Restless leg syndrome   . Ulcer   . Urinary incontinence     Past Surgical History:  Procedure Laterality Date  . BILATERAL BUNIONECTOMY 1979    . BREAST SURGERY     biopsy   . COLON SURGERY    . SMALL INTESTINE SURGERY    . SURGERY FOR BOWEL OBSTRUCTION AND REMOVAL  OF PART OF STOMACH  IN 2000  2000  . TOTAL KNEE ARTHROPLASTY  02/16/2012   Procedure: TOTAL KNEE ARTHROPLASTY;  Surgeon: Javier Docker, MD;  Location: [facility] ORS;  Service: Orthopedics;  Laterality: Left;  . TOTAL KNEE ARTHROPLASTY Right 11/20/2014   Procedure: RIGHT TOTAL KNEE ARTHROPLASTY;  Surgeon: Javier Docker, MD;  Location: [facility] OR[facility];  Service: Orthopedics;  Laterality: Right;     reports that she quit smoking  about 28 years ago. Her smoking use included Cigarettes. She has a 40.00 pack-year smoking history. She has never used smokeless tobacco. She reports that she does not drink alcohol or use drugs.  Allergies  Allergen Reactions  . Zolpidem Tartrate Other (See Comments)    hallucinations  . Amoxicillin Rash    Family History  Problem Relation Age of Onset  . Sleep apnea Father   . Multiple sclerosis Sister   . Multiple sclerosis Sister     Prior to Admission medications   Medication Sig Start Date End Date Taking? Authorizing Provider  acetaminophen (TYLENOL) 325 MG tablet Take 650 mg by mouth every 6 (six) hours as needed for mild pain.   Yes [provider]  amLODipine (NORVASC) 10 MG tablet Take 1 tablet (10 mg total) by mouth daily. 05/17/17  Yes Jake Bathe, MD  buPROPion (WELLBUTRIN XL) 150 MG 24 hr tablet Take 150 mg by mouth every morning.   Yes [provider]  carvedilol (COREG) 12.5 MG tablet Take 1 tablet (12.5 mg total) by mouth 2 (two) times daily with a meal. 03/17/17  Yes Seiler, [facility] K, NP  cyanocobalamin (,VITAMIN B-12,) 1000 MCG/ML injection Inject 1,000 mcg into the muscle every 30 (thirty) days.   Yes [provider]  dofetilide (TIKOSYN) 500 MCG capsule Take 1 capsule (500 mcg total) by mouth 2 (two) times daily. 03/17/17  Yes Seiler, Amber K, NP  ELIQUIS 5 MG TABS tablet Take 5 mg by mouth 2 (two) times daily. 10/20/16  Yes [provider]  gabapentin (NEURONTIN) 600 MG tablet Take 600 mg by mouth 4 (four) times daily.   Yes [provider]  hydrALAZINE (APRESOLINE) 50 MG tablet Take 1.5 tablets (75 mg total) by mouth 3 (three) times daily. 04/03/17 07/02/17 Yes Rosalio Macadamia, NP  iron polysaccharides (NIFEREX) 150 MG capsule Take 150 mg by mouth daily.   Yes [provider]  lisinopril (PRINIVIL,ZESTRIL) 40 MG tablet Take 1 tablet (40 mg total) by mouth daily. 03/22/17 06/20/17 Yes Marinus Maw, MD  meclizine  (ANTIVERT) 25 MG tablet Take 25 mg by mouth 3 (three) times daily as needed for dizziness. 10/27/16  Yes [provider]  mirtazapine (REMERON) 15 MG tablet Take 15 mg by mouth at bedtime.   Yes [provider]  Multiple Vitamins-Minerals (MULTIVITAMIN & MINERAL PO) Take 1 tablet by mouth daily.   Yes [provider]  potassium chloride (K-DUR) 10 MEQ tablet Take 2 tablets (20 mEq total) by mouth daily. 03/24/17 06/22/17 Yes Newman Nip, NP  vortioxetine HBr (TRINTELLIX) 10 MG TABS Take 10 mg by mouth 2 (two) times daily.    Yes [provider]    Physical Exam:  Constitutional:  obese female in NAD, calm, comfortable Vitals:   06/05/17 2115 06/05/17 2130 06/05/17 2145 06/05/17 2230  BP: (!) 162/66 (!) 156/58 (!) 156/56 135/88  Pulse: 61 60 (!) 57 (!) 58  Resp:      Temp:      TempSrc:  SpO2: 99% 99% 99% 98%  Weight:      Height:       Eyes: PERRL, lids and conjunctivae normal ENMT: Mucous membranes are moist. Posterior pharynx clear of any exudate or lesions.Normal dentition.  Neck: normal, supple, no masses, no thyromegaly Respiratory: clear to auscultation bilaterally, no wheezing, no crackles. Normal respiratory effort. No accessory muscle use.  Cardiovascular: Regular rate and rhythm, no murmurs / rubs / gallops. No extremity edema. 2+ pedal pulses. No carotid bruits.  Abdomen: no tenderness, no masses palpated. No hepatosplenomegaly. Bowel sounds positive.  Musculoskeletal: no clubbing / cyanosis. No joint deformity upper and lower extremities. Good ROM, no contractures. Normal muscle tone.  Skin: Ulceration present of the lateral aspect of left foot with swelling and erythema on the dorsal aspect of the forefoot.     Neurologic: CN 2-12 grossly intact. Sensation abnormal DTR normal. Strength 5/5 in all 4.  Psychiatric: Normal judgment and insight. Alert and oriented x 3. Normal mood.     Labs on Admission: I have personally  reviewed following labs and imaging studies  CBC:  Recent Labs Lab 06/05/17 1956  WBC 8.8  HGB 10.7*  HCT 35.5*  MCV 91.0  PLT 419*   Basic Metabolic Panel:  Recent Labs Lab 06/05/17 1956  NA 137  K 4.3  CL 110  CO2 18*  GLUCOSE 93  BUN 35*  CREATININE 1.55*  CALCIUM 9.3   GFR: Estimated Creatinine Clearance: 45.7 mL/min (A) (by C-G formula based on SCr of 1.55 mg/dL (H)). Liver Function Tests: No results for input(s): AST, ALT, ALKPHOS, BILITOT, PROT, ALBUMIN in the last 168 hours. No results for input(s): LIPASE, AMYLASE in the last 168 hours. No results for input(s): AMMONIA in the last 168 hours. Coagulation Profile: No results for input(s): INR, PROTIME in the last 168 hours. Cardiac Enzymes: No results for input(s): CKTOTAL, CKMB, CKMBINDEX, TROPONINI in the last 168 hours. BNP (last 3 results) No results for input(s): PROBNP in the last 8760 hours. HbA1C: No results for input(s): HGBA1C in the last 72 hours. CBG: No results for input(s): GLUCAP in the last 168 hours. Lipid Profile: No results for input(s): CHOL, HDL, LDLCALC, TRIG, CHOLHDL, LDLDIRECT in the last 72 hours. Thyroid Function Tests: No results for input(s): TSH, T4TOTAL, FREET4, T3FREE, THYROIDAB in the last 72 hours. Anemia Panel: No results for input(s): VITAMINB12, FOLATE, FERRITIN, TIBC, IRON, RETICCTPCT in the last 72 hours. Urine analysis:    Component Value Date/Time   COLORURINE YELLOW 06/12/2015 1732   APPEARANCEUR CLOUDY (A) 06/12/2015 1732   LABSPEC 1.021 06/12/2015 1732   PHURINE 6.0 06/12/2015 1732   GLUCOSEU NEGATIVE 06/12/2015 1732   HGBUR TRACE (A) 06/12/2015 1732   BILIRUBINUR NEGATIVE 06/12/2015 1732   BILIRUBINUR neg 04/21/2015 0956   KETONESUR NEGATIVE 06/12/2015 1732   PROTEINUR NEGATIVE 06/12/2015 1732   UROBILINOGEN 1.0 06/12/2015 1732   NITRITE NEGATIVE 06/12/2015 1732   LEUKOCYTESUR SMALL (A) 06/12/2015 1732   Sepsis Labs: No results found for this or any  previous visit (from the past 240 hour(s)).   Radiological Exams on Admission: Dg Foot 2 Views Left  Result Date: 06/05/2017 CLINICAL DATA:  Lateral foot ulcer for 10 days. EXAM: LEFT FOOT - 2 VIEW COMPARISON:  MRI 03/31/2017 FINDINGS: Advanced degenerative changes at the first MTP joint with a marked hallux valgus deformity. No acute bony findings. The other MTP joints are maintained. Suspect prior surgery involving the fifth metatarsal with a lateral osteotomy. There is marked soft tissue  swelling over the lateral aspect of the fifth toe and fifth MTP joint but I do not see any definite destructive bony changes to suggest osteomyelitis. Mild pes cavus. Small calcaneal heel spur. IMPRESSION: Significant soft tissue swelling over the lateral aspect of the forefoot Suspect prior postoperative changes involving the fifth metatarsal head but no findings to suggest osteomyelitis. First MTP joint degenerative changes and marked hallux valgus deformity. Moderate midfoot degenerative changes. Electronically Signed   By: Rudie Meyer M.D.   On: 06/05/2017 20:50    X-rays of left foot Independently reviewed. Showing soft tissue swelling.  Assessment/Plan Left foot wound infection with cellulitis: Acute. Patient presents with a left foot wound with erythema and swelling not improved on doxycycline in outpatient setting. - Admit to MedSurg bed - Follow-up wound and blood cultures - Continue empiric antibiotics of vancomycin - May warrant ABI study  - will need to consult/notify Dr. Victorino Dike  of [facility] orthopedics in a.m. as the patient already sees him in the outpatient setting   Acute kidney injury on chronic kidney disease stage III : Acute. Patient's baseline creatinine previously around 1.2, but she presents with a creatinine of 1.55 and BUN 35. Noting patient's recent use of Bactrim as a possible precipitating factor.  - IV fluids NS at 75 ml/ hr - Recheck BMP in a.m.  Essential hypertension -  Continue Coreg, hydralazine, and lisinopril  Peripheral neuropathy - Continue gabapentin  Depression - Continue wellbutrin and vortioxetine  Paroxysmal atrial fibrillation: Patient was schedule her follow-up appointment with Dr. Ladona Ridgel of cardiology today.   - Continue Tikosyn, Elqiuis  Morbid obesity: BMI 43.5  DVT prophylaxis: Eliquis Code Status:Full  Family Communication: No family present at bedside Disposition Plan:  likely discharge home once medically stable  Consults called: none Admission status: Inpatient  Clydie Braun MD [facility] Pager [phone]   If 7PM-7AM, please contact night-coverage [email or web address] Password [email or web address]  06/05/2017, 11:06 PM

## 2017-06-05 NOTE — ED Provider Notes (Signed)
[facility] DEPT Provider Note   CSN: [id number] Arrival date & time: 06/05/17  1122     History   Chief Complaint Chief Complaint  Patient presents with  . Wound Infection    HPI [name] is a 66 y.o. female.  This is a 66 year old female with PMH of HLD, HTN, obesity, restless leg syndrome and neuropathy, depression, arthritis who presents with a two-week history of worsening foot wound.  Patient states about 2 weeks ago she unknowingly stubbed her lateral side of her left foot up against the wall and she noticed 3 days later that it was painful and had an open sore.  She called her PCP who called in a prescription for doxycycline over the phone for her to take.  She has taken the prescription since 6 days ago however has noted no improvement and worsening pain.  She is also concerned because there was increasing redness and swelling of her left foot.  She denies any change in motor movement, change in sensation from baseline.  Denies any urinary symptoms, chest pain, dyspnea.  She wears a boot on her right foot apparently due to a prior fracture from stubbing her toe. She called her orthopedic doctor who told her to come to the ED for further evaluation.   The history is provided by the patient.    Past Medical History:  Diagnosis Date  . Anxiety   . 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  PT ALSO HAS LOWER BACK PAIN AT TIMES  . Depression   . GERD (gastroesophageal reflux disease)    RARE- OTC ANTACID IF NEEDED--PAST HX OF ULCER  . Headache    wakes up with headache   . Hyperlipidemia   . Hypertension   . Kidney stones    PT PASSED STONES--NO KNOWN STONES AT PRESENT TIMES  . Lumbago 10/02/2013  . Obesity   . OSA (obstructive sleep apnea) 12/30/2013   Very mild OSA with REM accentuation,  tested 11-29-13 at [facility] sleep, Dr Rexene Alberts .   Marland Kitchen Periodic limb movement disorder (PLMD) 12/30/2013  . Restless leg syndrome   . Ulcer   . Urinary incontinence      Patient Active Problem List   Diagnosis Date Noted  . Cellulitis of foot 06/05/2017  . Paroxysmal atrial fibrillation ([place]) 03/14/2017  . Dyspnea 06/12/2015  . Morbid obesity ([place]) 06/12/2015  . PAF (paroxysmal atrial fibrillation) ([place]) 06/12/2015  . 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 Periodic limb movement disorder (PLMD) 12/30/2013  . OSA (obstructive sleep apnea) 12/30/2013  . Dizziness and giddiness 10/02/2013  . Abnormality of gait 10/02/2013  . Lumbago 10/02/2013  . Essential hypertension 02/17/2012  . 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 Palpitations 12/24/2009  . DYSPNEA ON EXERTION 12/24/2009  . CHEST PAIN 12/24/2009    Past Surgical History:  Procedure Laterality Date  . BILATERAL BUNIONECTOMY 1979    . BREAST SURGERY     biopsy   . COLON SURGERY    . SMALL INTESTINE SURGERY    . SURGERY FOR BOWEL OBSTRUCTION AND REMOVAL OF PART OF STOMACH  IN 2000  2000  . TOTAL KNEE ARTHROPLASTY  02/16/2012   Procedure: TOTAL KNEE ARTHROPLASTY;  Surgeon: Johnn Hai, MD;  Location: [facility] ORS;  Service: Orthopedics;  Laterality: Left;  . TOTAL KNEE ARTHROPLASTY Right 11/20/2014   Procedure: RIGHT TOTAL KNEE ARTHROPLASTY;  Surgeon: Johnn Hai, MD;  Location: [facility] ORS;  Service: Orthopedics;  Laterality: Right;  OB History    No data available       Home Medications    Prior to Admission medications   Medication Sig Start Date End Date Taking? Authorizing Provider  acetaminophen (TYLENOL) 325 MG tablet Take 650 mg by mouth every 6 (six) hours as needed for mild pain.   Yes [provider]  amLODipine (NORVASC) 10 MG tablet Take 1 tablet (10 mg total) by mouth daily. 05/17/17  Yes Jerline Pain, MD  buPROPion (WELLBUTRIN XL) 150 MG 24 hr tablet Take 150 mg by mouth every morning.   Yes [provider]  carvedilol (COREG) 12.5 MG tablet Take 1 tablet (12.5 mg total) by mouth 2 (two) times daily with a meal. 03/17/17  Yes Seiler, [employer] K, NP   cyanocobalamin (,VITAMIN B-12,) 1000 MCG/ML injection Inject 1,000 mcg into the muscle every 30 (thirty) days.   Yes [provider]  dofetilide (TIKOSYN) 500 MCG capsule Take 1 capsule (500 mcg total) by mouth 2 (two) times daily. 03/17/17  Yes Seiler, Amber K, NP  ELIQUIS 5 MG TABS tablet Take 5 mg by mouth 2 (two) times daily. 10/20/16  Yes [provider]  gabapentin (NEURONTIN) 600 MG tablet Take 600 mg by mouth 4 (four) times daily.   Yes [provider]  hydrALAZINE (APRESOLINE) 50 MG tablet Take 1.5 tablets (75 mg total) by mouth 3 (three) times daily. 04/03/17 07/02/17 Yes Burtis Junes, NP  iron polysaccharides (NIFEREX) 150 MG capsule Take 150 mg by mouth daily.   Yes [provider]  lisinopril (PRINIVIL,ZESTRIL) 40 MG tablet Take 1 tablet (40 mg total) by mouth daily. 03/22/17 06/20/17 Yes Evans Lance, MD  meclizine (ANTIVERT) 25 MG tablet Take 25 mg by mouth 3 (three) times daily as needed for dizziness. 10/27/16  Yes [provider]  mirtazapine (REMERON) 15 MG tablet Take 15 mg by mouth at bedtime.   Yes [provider]  Multiple Vitamins-Minerals (MULTIVITAMIN & MINERAL PO) Take 1 tablet by mouth daily.   Yes [provider]  potassium chloride (K-DUR) 10 MEQ tablet Take 2 tablets (20 mEq total) by mouth daily. 03/24/17 06/22/17 Yes Sherran Needs, NP  vortioxetine HBr (TRINTELLIX) 10 MG TABS Take 10 mg by mouth 2 (two) times daily.    Yes [provider]    Family History Family History  Problem Relation Age of Onset  . Sleep apnea Father   . Multiple sclerosis Sister   . Multiple sclerosis Sister     Social History Social History  Substance Use Topics  . Smoking status: Former Smoker    Packs/day: 2.00    Years: 20.00    Types: Cigarettes    Quit date: 10/17/1988  . Smokeless tobacco: Never Used  . Alcohol use No     Allergies   Zolpidem tartrate and Amoxicillin   Review of Systems Review  of Systems  Constitutional: Negative for chills, diaphoresis and fever.  HENT: Negative for ear pain and sore throat.   Eyes: Negative for pain and visual disturbance.  Respiratory: Negative for cough and shortness of breath.   Cardiovascular: Negative for chest pain and palpitations.  Gastrointestinal: Negative for abdominal pain and vomiting.  Genitourinary: Negative for dysuria and hematuria.  Musculoskeletal: Positive for gait problem. Negative for arthralgias and back pain.  Skin: Positive for wound. Negative for color change and rash.  Neurological: Positive for numbness. Negative for seizures and syncope.  All other systems reviewed and are negative.  Physical Exam Updated Vital Signs BP (!) 105/45   Pulse (!) 54   Temp 97.8 F (36.6 C) (Oral)   Resp 17   Ht 5' 4.75" (1.645 m)   Wt 117.9 kg (260 lb)   LMP  (LMP Unknown)   SpO2 98%   BMI 43.60 kg/m   Physical Exam  Constitutional: She appears well-developed and well-nourished. No distress.  HENT:  Head: Normocephalic and atraumatic.  Eyes: Conjunctivae are normal.  Neck: Neck supple.  Cardiovascular: Normal rate and regular rhythm.   No murmur heard. Pulmonary/Chest: Effort normal and breath sounds normal. No respiratory distress.  Abdominal: Soft. There is no tenderness.  Musculoskeletal: She exhibits no edema.  Exam as above.  Erythema and swelling to the left foot.  No decreased motor function noted.  Patient has decreased sensation which is consistent with chronic neuropathy.  Neurological: She is alert.  Skin: Skin is warm and dry. Lesion noted.  Two foot ulcers noted on sole of left foot. One medial which the patient is aware of, one lateral which is 1 cm in diameter and is new. Cat hair is present overlying the wound.   Psychiatric: She has a normal mood and affect.  Nursing note and vitals reviewed.    ED Treatments / Results  Labs (all labs ordered are listed, but only abnormal results are  displayed) Labs Reviewed  CBC - Abnormal; Notable for the following:       Result Value   Hemoglobin 10.7 (*)    HCT 35.5 (*)    RDW 16.5 (*)    Platelets 419 (*)    All other components within normal limits  BASIC METABOLIC PANEL - Abnormal; Notable for the following:    CO2 18 (*)    BUN 35 (*)    Creatinine, Ser 1.55 (*)    GFR calc non Af Amer 34 (*)    GFR calc Af Amer 39 (*)    All other components within normal limits  C-REACTIVE PROTEIN - Abnormal; Notable for the following:    CRP 3.7 (*)    All other components within normal limits  SEDIMENTATION RATE - Abnormal; Notable for the following:    Sed Rate 89 (*)    All other components within normal limits  AEROBIC/ANAEROBIC CULTURE (SURGICAL/DEEP WOUND)  CULTURE, BLOOD (ROUTINE X 2)  CULTURE, BLOOD (ROUTINE X 2)  URINALYSIS, ROUTINE W REFLEX MICROSCOPIC  HEMOGLOBIN A1C  PREALBUMIN  CBC  BASIC METABOLIC PANEL  I-STAT CG4 LACTIC ACID, ED    EKG  EKG Interpretation None       Radiology Dg Foot 2 Views Left  Result Date: 06/05/2017 CLINICAL DATA:  Lateral foot ulcer for 10 days. EXAM: LEFT FOOT - 2 VIEW COMPARISON:  MRI 03/31/2017 FINDINGS: Advanced degenerative changes at the first MTP joint with a marked hallux valgus deformity. No acute bony findings. The other MTP joints are maintained. Suspect prior surgery involving the fifth metatarsal with a lateral osteotomy. There is marked soft tissue swelling over the lateral aspect of the fifth toe and fifth MTP joint but I do not see any definite destructive bony changes to suggest osteomyelitis. Mild pes cavus. Small calcaneal heel spur. IMPRESSION: Significant soft tissue swelling over the lateral aspect of the forefoot Suspect prior postoperative changes involving the fifth metatarsal head but no findings to suggest osteomyelitis. First MTP joint degenerative changes and marked hallux valgus deformity. Moderate midfoot degenerative changes. Electronically Signed   By: Ricky Stabs.D.  On: 06/05/2017 20:50    Procedures Procedures (including critical care time)  Medications Ordered in ED Medications  hydrALAZINE (APRESOLINE) tablet 75 mg (not administered)  lisinopril (PRINIVIL,ZESTRIL) tablet 40 mg (not administered)  carvedilol (COREG) tablet 12.5 mg (not administered)  dofetilide (TIKOSYN) capsule 500 mcg (not administered)  potassium chloride (K-DUR,KLOR-CON) CR tablet 20 mEq (not administered)  apixaban (ELIQUIS) tablet 5 mg (not administered)  buPROPion (WELLBUTRIN XL) 24 hr tablet 150 mg (not administered)  iron polysaccharides (NIFEREX) capsule 150 mg (not administered)  gabapentin (NEURONTIN) tablet 600 mg (not administered)  meclizine (ANTIVERT) tablet 25 mg (not administered)  vortioxetine HBr (TRINTELLIX) TABS 20 mg (not administered)  0.9 %  sodium chloride infusion ( Intravenous New Bag/Given 06/05/17 2352)  ondansetron (ZOFRAN) tablet 4 mg (not administered)    Or  ondansetron (ZOFRAN) injection 4 mg (not administered)  acetaminophen (TYLENOL) tablet 650 mg (not administered)    Or  acetaminophen (TYLENOL) suppository 650 mg (not administered)  albuterol (PROVENTIL) (2.5 MG/3ML) 0.083% nebulizer solution 2.5 mg (not administered)  vancomycin (VANCOCIN) IVPB 1000 mg/200 mL premix (1,000 mg Intravenous New Bag/Given 06/05/17 2352)  lactated ringers bolus 1,000 mL (0 mLs Intravenous Stopped 06/05/17 2214)  vancomycin (VANCOCIN) IVPB 1000 mg/200 mL premix (0 mg Intravenous Stopped 06/05/17 2332)     Initial Impression / Assessment and Plan / ED Course  I have reviewed the triage vital signs and the nursing notes.  Pertinent labs & imaging results that were available during my care of the patient were reviewed by me and considered in my medical decision making (see chart for details).     This is a 66 year old female with PMH of HLD, HTN, obesity, restless leg syndrome and neuropathy, depression, arthritis who presents with a two-week  history of worsening foot wound.   Exam as stated above. Briefly, two foot ulcers noted on sole of left foot. One medial which the patient is aware of, one lateral which is 1 cm in diameter and is new. Cat hair is present overlying the wound.  Patient is afebrile, she denies any change in appetite, nausea, vomiting.  At this time concern for failed outpatient antibody therapy as well as possibility of acute osteomyelitis.  X-ray of the left foot ordered along with CRP, ESR, blood cultures, wound culture, CBC, BMP, lactate.  No leukocytosis noted. CRP elevated at 3.7. XR not suggestive of acute osteomyelitis.   Spoke with hospitalist for admission for IV abx due to failed outpatient therapy. IV Vancomycin begun. All questions answered. Admission agreed upon and patient updated.  Final Clinical Impressions(s) / ED Diagnoses   Final diagnoses:  Wound infection  Cellulitis of left foot    New Prescriptions New Prescriptions   No medications on file     Aldona Lento, MD 06/05/17 [id number]    Charlesetta Shanks, MD 06/08/17 [phone]

## 2017-06-05 NOTE — ED Notes (Addendum)
1st Lactic was WNL

## 2017-06-05 NOTE — Progress Notes (Signed)
Pharmacy Antibiotic Note  Enriqueta ShutterCarolyn I Zeman is a 66 y.o. female admitted on 06/05/2017 with worsening wound infection of the left foot. Pharmacy has been consulted for vancomycin dosing.  Patient afebrile and WBC within normal limits. LA is also normal at 1.18. SCr is elevated at 1.55 (BL ~1.2) for estimated nCrCl ~ 40-45 mL/min.   Per MD, this is deep tissue infection and would like to aim for trough closer to 15 mcg/mL. Per xray report: no definitive changes to suggest osteo.   Plan: Vancomycin 2g IV x1 (given as two consecutive 1g doses), then 1250 mg IV q24hr Monitor renal function, clinical picture, and culture data Vancomycin trough at steady state and as needed, goal 15-20 mcg/mL F/u length of therapy and de-escalation   Height: 5' 4.75" (164.5 cm) Weight: 260 lb (117.9 kg) IBW/kg (Calculated) : 56.43  Temp (24hrs), Avg:97.8 F (36.6 C), Min:97.8 F (36.6 C), Max:97.8 F (36.6 C)   Recent Labs Lab 06/05/17 1956 06/05/17 2021  WBC 8.8  --   CREATININE 1.55*  --   LATICACIDVEN  --  1.18    Estimated Creatinine Clearance: 45.7 mL/min (A) (by C-G formula based on SCr of 1.55 mg/dL (H)).    Allergies  Allergen Reactions  . Zolpidem Tartrate Other (See Comments)    hallucinations  . Amoxicillin Rash    Antimicrobials this admission: 9/11 Vanc >>   Microbiology results: pending   York CeriseKatherine Cook, PharmD Clinical Pharmacist 06/06/17 12:15 AM

## 2017-06-05 NOTE — ED Triage Notes (Signed)
About 2 weeks ago had a piece of skin hanging off of left lateral area near pinkie toe.  Then started seeing redness.  Was placed on doxycycline and the then pinkness started spreading again over the weekend her toe is getting darker. Pulse present.

## 2017-06-06 ENCOUNTER — Ambulatory Visit: Payer: PPO | Admitting: Internal Medicine

## 2017-06-06 ENCOUNTER — Inpatient Hospital Stay (HOSPITAL_COMMUNITY): Payer: PPO

## 2017-06-06 ENCOUNTER — Other Ambulatory Visit: Payer: Self-pay

## 2017-06-06 DIAGNOSIS — N179 Acute kidney failure, unspecified: Secondary | ICD-10-CM

## 2017-06-06 DIAGNOSIS — S91302A Unspecified open wound, left foot, initial encounter: Secondary | ICD-10-CM | POA: Diagnosis present

## 2017-06-06 LAB — CBC
HCT: 34.1 % — ABNORMAL LOW (ref 36.0–46.0)
HEMOGLOBIN: 10.2 g/dL — AB (ref 12.0–15.0)
MCH: 27.3 pg (ref 26.0–34.0)
MCHC: 29.9 g/dL — AB (ref 30.0–36.0)
MCV: 91.4 fL (ref 78.0–100.0)
Platelets: 400 10*3/uL (ref 150–400)
RBC: 3.73 MIL/uL — AB (ref 3.87–5.11)
RDW: 16.2 % — ABNORMAL HIGH (ref 11.5–15.5)
WBC: 8.8 10*3/uL (ref 4.0–10.5)

## 2017-06-06 LAB — URINALYSIS, ROUTINE W REFLEX MICROSCOPIC
Bilirubin Urine: NEGATIVE
Glucose, UA: NEGATIVE mg/dL
Hgb urine dipstick: NEGATIVE
Ketones, ur: NEGATIVE mg/dL
Nitrite: NEGATIVE
Protein, ur: NEGATIVE mg/dL
SPECIFIC GRAVITY, URINE: 1.013 (ref 1.005–1.030)
pH: 5 (ref 5.0–8.0)

## 2017-06-06 LAB — HEMOGLOBIN A1C
Hgb A1c MFr Bld: 5.6 % (ref 4.8–5.6)
Mean Plasma Glucose: 114.02 mg/dL

## 2017-06-06 LAB — BASIC METABOLIC PANEL
Anion gap: 7 (ref 5–15)
BUN: 32 mg/dL — ABNORMAL HIGH (ref 6–20)
CHLORIDE: 111 mmol/L (ref 101–111)
CO2: 20 mmol/L — ABNORMAL LOW (ref 22–32)
CREATININE: 1.56 mg/dL — AB (ref 0.44–1.00)
Calcium: 8.9 mg/dL (ref 8.9–10.3)
GFR, EST AFRICAN AMERICAN: 39 mL/min — AB (ref >60)
GFR, EST NON AFRICAN AMERICAN: 34 mL/min — AB (ref >60)
Glucose, Bld: 131 mg/dL — ABNORMAL HIGH (ref 65–99)
Potassium: 4 mmol/L (ref 3.5–5.1)
Sodium: 138 mmol/L (ref 135–145)

## 2017-06-06 LAB — PREALBUMIN: PREALBUMIN: 12.6 mg/dL — AB (ref 18–38)

## 2017-06-06 MED ORDER — DEXTROSE 5 % IV SOLN
1.0000 g | Freq: Two times a day (BID) | INTRAVENOUS | Status: DC
  Administered 2017-06-06 – 2017-06-07 (×3): 1 g via INTRAVENOUS
  Filled 2017-06-06 (×3): qty 1

## 2017-06-06 MED ORDER — MIRTAZAPINE 15 MG PO TABS
15.0000 mg | ORAL_TABLET | Freq: Every day | ORAL | Status: DC
  Administered 2017-06-06 – 2017-06-08 (×4): 15 mg via ORAL
  Filled 2017-06-06 (×4): qty 1

## 2017-06-06 MED ORDER — DIPHENHYDRAMINE HCL 25 MG PO CAPS
25.0000 mg | ORAL_CAPSULE | Freq: Four times a day (QID) | ORAL | Status: DC | PRN
  Administered 2017-06-06 – 2017-06-07 (×2): 50 mg via ORAL
  Filled 2017-06-06 (×2): qty 2

## 2017-06-06 MED ORDER — VORTIOXETINE HBR 20 MG PO TABS
20.0000 mg | ORAL_TABLET | Freq: Every day | ORAL | Status: DC
  Administered 2017-06-06 – 2017-06-09 (×4): 20 mg via ORAL
  Filled 2017-06-06 (×4): qty 20

## 2017-06-06 MED ORDER — VANCOMYCIN HCL 10 G IV SOLR
1250.0000 mg | INTRAVENOUS | Status: DC
  Administered 2017-06-06 – 2017-06-09 (×3): 1250 mg via INTRAVENOUS
  Filled 2017-06-06 (×3): qty 1250

## 2017-06-06 MED ORDER — ACETAMINOPHEN 325 MG PO TABS: 650.0000 mg | ORAL_TABLET | Freq: Four times a day (QID) | ORAL | Status: DC | PRN

## 2017-06-06 MED ORDER — GADOBENATE DIMEGLUMINE 529 MG/ML IV SOLN
10.0000 mL | Freq: Once | INTRAVENOUS | Status: AC | PRN
  Administered 2017-06-06: 10 mL via INTRAVENOUS

## 2017-06-06 MED ORDER — AMLODIPINE BESYLATE 10 MG PO TABS
10.0000 mg | ORAL_TABLET | Freq: Every day | ORAL | Status: DC
  Administered 2017-06-06 (×2): 10 mg via ORAL
  Filled 2017-06-06 (×3): qty 1
  Filled 2017-06-06: qty 2

## 2017-06-06 NOTE — ED Notes (Signed)
Called Microbiology to question the aerobic/anaerobic culture.  This RN was told that specimen can be obtained through aspiration, swab or any sterile container, depends how it was written, offered pt's MRN number to be able to look at the order and explained that the wound was open and callused without much drainage.  This RN was advised to use a swab try to obtain any fluid on it to send.

## 2017-06-06 NOTE — Progress Notes (Signed)
Orthopedic Tech Progress Note Patient Details:  Yvette ShutterCarolyn I Muto [birth year] [id number]  Ortho Devices Type of Ortho Device: Postop shoe/boot Ortho Device/Splint Location: lle Ortho Device/Splint Interventions: Application   Jacorie Ernsberger 06/06/2017, 3:27 PM

## 2017-06-06 NOTE — Consult Note (Signed)
Reason for Consult: Concern for L foot osteomyelitis Referring Physician: Vance Gather, MD   HPI: Patient is a 66 yo female, with PMH significant for idiopathic peripheral neuropathy and hx of previous foot ulcers, presented to the [facility] ED on 06/05/17 with concerns of worsening L lateral foot wound.  She reports that 10 days ago she had a new wound on her lateral foot.  She does not recall a specific mechanism of injury.  She denies any fever, chills, N/V, or pain.  She reports that about a week ago her 5th toe was becoming red and she had increased swelling.  She called her PCP and was prescribed doxycycline.  She states that even after starting the ABX she experienced new drainage from the wound and that her 5th toe was becoming more erythematous.  The patient then decided to come to the hospital and was admitted to the hospitalist service.  She is on IV vanc.  She reports that her foot looks much better since she was admitted.  She is known to [name] for her previous foot ulcers.  Upon arrival she has had radiographs that were negative for osteomyelitis.  An MRI has been ordered.  WBC is 8.8.    Past Medical History:  Diagnosis Date  . Anxiety   . 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  PT ALSO HAS LOWER BACK PAIN AT TIMES  . Depression   . GERD (gastroesophageal reflux disease)    RARE- OTC ANTACID IF NEEDED--PAST HX OF ULCER  . Headache    wakes up with headache   . Hyperlipidemia   . Hypertension   . Kidney stones    PT PASSED STONES--NO KNOWN STONES AT PRESENT TIMES  . Lumbago 10/02/2013  . Obesity   . OSA (obstructive sleep apnea) 12/30/2013   Very mild OSA with REM accentuation,  tested 11-29-13 at [facility] sleep, Dr Rexene Alberts .   Marland Kitchen Periodic limb movement disorder (PLMD) 12/30/2013  . Restless leg syndrome   . Ulcer   . Urinary incontinence     Past Surgical History:  Procedure Laterality Date  . BILATERAL BUNIONECTOMY 1979    . BREAST SURGERY     biopsy   . COLON  SURGERY    . SMALL INTESTINE SURGERY    . SURGERY FOR BOWEL OBSTRUCTION AND REMOVAL OF PART OF STOMACH  IN 2000  2000  . TOTAL KNEE ARTHROPLASTY  02/16/2012   Procedure: TOTAL KNEE ARTHROPLASTY;  Surgeon: Johnn Hai, MD;  Location: [facility] ORS;  Service: Orthopedics;  Laterality: Left;  . TOTAL KNEE ARTHROPLASTY Right 11/20/2014   Procedure: RIGHT TOTAL KNEE ARTHROPLASTY;  Surgeon: Johnn Hai, MD;  Location: [facility] ORS;  Service: Orthopedics;  Laterality: Right;    Family History  Problem Relation Age of Onset  . Sleep apnea Father   . Multiple sclerosis Sister   . Multiple sclerosis Sister     Social History:  reports that she quit smoking about 28 years ago. Her smoking use included Cigarettes. She has a 40.00 pack-year smoking history. She has never used smokeless tobacco. She reports that she does not drink alcohol or use drugs.  Allergies:  Allergies  Allergen Reactions  . Zolpidem Tartrate Other (See Comments)    hallucinations  . Amoxicillin Rash    Medications: I have reviewed the patient's current medications.  Results for orders placed or performed during the hospital encounter of 06/05/17 (from the past 48 hour(s))  CBC  Status: Abnormal   Collection Time: 06/05/17  7:56 PM  Result Value Ref Range   WBC 8.8 4.0 - 10.5 K/uL   RBC 3.90 3.87 - 5.11 MIL/uL   Hemoglobin 10.7 (L) 12.0 - 15.0 g/dL   HCT 35.5 (L) 36.0 - 46.0 %   MCV 91.0 78.0 - 100.0 fL   MCH 27.4 26.0 - 34.0 pg   MCHC 30.1 30.0 - 36.0 g/dL   RDW 16.5 (H) 11.5 - 15.5 %   Platelets 419 (H) 150 - 400 K/uL  Basic metabolic panel     Status: Abnormal   Collection Time: 06/05/17  7:56 PM  Result Value Ref Range   Sodium 137 135 - 145 mmol/L   Potassium 4.3 3.5 - 5.1 mmol/L   Chloride 110 101 - 111 mmol/L   CO2 18 (L) 22 - 32 mmol/L   Glucose, Bld 93 65 - 99 mg/dL   BUN 35 (H) 6 - 20 mg/dL   Creatinine, Ser 1.55 (H) 0.44 - 1.00 mg/dL   Calcium 9.3 8.9 - 10.3 mg/dL   GFR calc non Af Amer 34 (L) >60  mL/min   GFR calc Af Amer 39 (L) >60 mL/min    Comment: (NOTE) The eGFR has been calculated using the CKD EPI equation. This calculation has not been validated in all clinical situations. eGFR's persistently <60 mL/min signify possible Chronic Kidney Disease.    Anion gap 9 5 - 15  C-reactive protein     Status: Abnormal   Collection Time: 06/05/17  7:56 PM  Result Value Ref Range   CRP 3.7 (H) <1.0 mg/dL  Sedimentation rate     Status: Abnormal   Collection Time: 06/05/17  7:56 PM  Result Value Ref Range   Sed Rate 89 (H) 0 - 22 mm/hr  Blood culture (routine x 2)     Status: None (Preliminary result)   Collection Time: 06/05/17  7:58 PM  Result Value Ref Range   Specimen Description BLOOD LEFT ANTECUBITAL    Special Requests IN PEDIATRIC BOTTLE Blood Culture adequate volume    Culture NO GROWTH < 12 HOURS    Report Status PENDING   Blood culture (routine x 2)     Status: None (Preliminary result)   Collection Time: 06/05/17  8:03 PM  Result Value Ref Range   Specimen Description BLOOD LEFT ANTECUBITAL    Special Requests IN PEDIATRIC BOTTLE Blood Culture adequate volume    Culture NO GROWTH < 12 HOURS    Report Status PENDING   I-Stat CG4 Lactic Acid, ED     Status: None   Collection Time: 06/05/17  8:21 PM  Result Value Ref Range   Lactic Acid, Venous 1.18 0.5 - 1.9 mmol/L  Aerobic Culture (superficial specimen)     Status: None (Preliminary result)   Collection Time: 06/05/17  8:32 PM  Result Value Ref Range   Specimen Description WOUND LEFT FOOT    Special Requests NONE    Gram Stain      FEW WBC PRESENT, PREDOMINANTLY PMN MODERATE GRAM POSITIVE COCCI FEW GRAM POSITIVE RODS FEW GRAM NEGATIVE RODS    Culture PENDING    Report Status PENDING   Hemoglobin A1c     Status: None   Collection Time: 06/06/17  2:58 AM  Result Value Ref Range   Hgb A1c MFr Bld 5.6 4.8 - 5.6 %    Comment: (NOTE) Pre diabetes:          5.7%-6.4% Diabetes:              >  6.4% Glycemic  control for   <7.0% adults with diabetes    Mean Plasma Glucose 114.02 mg/dL  Prealbumin     Status: Abnormal   Collection Time: 06/06/17  2:58 AM  Result Value Ref Range   Prealbumin 12.6 (L) 18 - 38 mg/dL  CBC     Status: Abnormal   Collection Time: 06/06/17  2:58 AM  Result Value Ref Range   WBC 8.8 4.0 - 10.5 K/uL   RBC 3.73 (L) 3.87 - 5.11 MIL/uL   Hemoglobin 10.2 (L) 12.0 - 15.0 g/dL   HCT 34.1 (L) 36.0 - 46.0 %   MCV 91.4 78.0 - 100.0 fL   MCH 27.3 26.0 - 34.0 pg   MCHC 29.9 (L) 30.0 - 36.0 g/dL   RDW 16.2 (H) 11.5 - 15.5 %   Platelets 400 150 - 400 K/uL  Basic metabolic panel     Status: Abnormal   Collection Time: 06/06/17  2:58 AM  Result Value Ref Range   Sodium 138 135 - 145 mmol/L   Potassium 4.0 3.5 - 5.1 mmol/L   Chloride 111 101 - 111 mmol/L   CO2 20 (L) 22 - 32 mmol/L   Glucose, Bld 131 (H) 65 - 99 mg/dL   BUN 32 (H) 6 - 20 mg/dL   Creatinine, Ser 1.56 (H) 0.44 - 1.00 mg/dL   Calcium 8.9 8.9 - 10.3 mg/dL   GFR calc non Af Amer 34 (L) >60 mL/min   GFR calc Af Amer 39 (L) >60 mL/min    Comment: (NOTE) The eGFR has been calculated using the CKD EPI equation. This calculation has not been validated in all clinical situations. eGFR's persistently <60 mL/min signify possible Chronic Kidney Disease.    Anion gap 7 5 - 15  Urinalysis, Routine w reflex microscopic     Status: Abnormal   Collection Time: 06/06/17  7:58 AM  Result Value Ref Range   Color, Urine YELLOW YELLOW   APPearance CLEAR CLEAR   Specific Gravity, Urine 1.013 1.005 - 1.030   pH 5.0 5.0 - 8.0   Glucose, UA NEGATIVE NEGATIVE mg/dL   Hgb urine dipstick NEGATIVE NEGATIVE   Bilirubin Urine NEGATIVE NEGATIVE   Ketones, ur NEGATIVE NEGATIVE mg/dL   Protein, ur NEGATIVE NEGATIVE mg/dL   Nitrite NEGATIVE NEGATIVE   Leukocytes, UA SMALL (A) NEGATIVE   RBC / HPF 0-5 0 - 5 RBC/hpf   WBC, UA 6-30 0 - 5 WBC/hpf   Bacteria, UA FEW (A) NONE SEEN   Squamous Epithelial / LPF 0-5 (A) NONE SEEN    Mucus PRESENT     Dg Foot 2 Views Left  Result Date: 06/05/2017 CLINICAL DATA:  Lateral foot ulcer for 10 days. EXAM: LEFT FOOT - 2 VIEW COMPARISON:  MRI 03/31/2017 FINDINGS: Advanced degenerative changes at the first MTP joint with a marked hallux valgus deformity. No acute bony findings. The other MTP joints are maintained. Suspect prior surgery involving the fifth metatarsal with a lateral osteotomy. There is marked soft tissue swelling over the lateral aspect of the fifth toe and fifth MTP joint but I do not see any definite destructive bony changes to suggest osteomyelitis. Mild pes cavus. Small calcaneal heel spur. IMPRESSION: Significant soft tissue swelling over the lateral aspect of the forefoot Suspect prior postoperative changes involving the fifth metatarsal head but no findings to suggest osteomyelitis. First MTP joint degenerative changes and marked hallux valgus deformity. Moderate midfoot degenerative changes. Electronically Signed   By: Ricky Stabs.D.  On: 06/05/2017 20:50    ROS:   Gen: Denies fever, chills, weight change, fatigue, night sweats MSK: Denies any pain on her L foot. Neuro: Denies headache, numbness, weakness, slurred speech, loss of memory or consciousness Derm: L lateral foot wound with improving erythema and drainage HEENT: Denies blurred vision, double vision, neck stiffness, dysphagia PULM: Denies shortness of breath, cough, sputum production, hemoptysis, wheezing CV: Denies chest pain, edema, orthopnea, palpitations GI: Denies abdominal pain, nausea, vomiting, diarrhea, hematochezia, melena, constipation  Endocrine: Denies hot or cold intolerance, polyuria, polyphagia or appetite change Heme: Denies easy bruising, bleeding, bleeding gums  PE:  Blood pressure (!) 105/58, pulse (!) 53, temperature 98.2 F (36.8 C), temperature source Oral, resp. rate (!) 22, height 5' 4.75" (1.645 m), weight 117.9 kg (260 lb), SpO2 98 %. General: WDWN patient in  NAD. Psych:  Appropriate mood and affect. Neuro:  A&O x 3, Moving all extremities, sensation intact to light touch HEENT:  EOMs intact Chest:  Even non-labored respirations Skin: L lateral foot ulcer, 1.5 cm in diameter, with no active drainage today.  I was able to probe about a cm.  Did not probe to bone. Extremities: warm/dry, moderate edema and erythmea consistent with cellulitis, with echymosis to the 5th phalanx.  No lymphadenopathy. Pulses: Dorsalis pedis and post tibialis 1+ MSK:  ROM: Full ankle ROM compared bilaterally.  Patient is able to actively DF/PF lesser toes, MMT: patient is able to perform a quad set, (-) Homan's   Assessment/Plan: L foot ulcer and cellulitis  I was unable to probe to bone.  Will await MRI results in order to determine definitive treatment. ABX per medicine team Dressing changes PRN WBAT L LE in flat post-op shoe.  Mechele Claude, PA-C [facility] Office:  [phone]

## 2017-06-06 NOTE — ED Notes (Addendum)
Spoke with Misty StanleyLisa in microbiology, states aerobic/anaerobic swab sent 4.5 hours ago cannot be completed with swab sample sent and she would change the order to result what they have.

## 2017-06-06 NOTE — Progress Notes (Signed)
PROGRESS NOTE  [name]  WUJ:[id number] DOB: [birth year] DOA: 06/05/2017 PCP: Jarome Matin, MD  Outpatient Specialists: Orthopedics, [facility] Brief Narrative: [name] is a a.ge female with a history of HTN, HLD, idiopathic lower extremity neuropathy and arthritis who presented to the ED on the advice of her doctor after worsening left lateral foot infection despite several days of doxycycline.    Assessment & Plan: Principal Problem:   Cellulitis of foot Active Problems:   Essential hypertension   Morbid obesity (HCC)   Paroxysmal atrial fibrillation (HCC)   Open wound of left foot  Left foot wound infection with cellulitis: Acute. Patient presents with a left foot wound with erythema and swelling not improved on doxycycline in outpatient setting. - Continue empiric vancomycin, will add zosyn.  - Follow up blood cultures. Superficial cultures at admission unlikely to be helpful. - Checking ABI and MRI, XR without definite osteomyelitis.  - I have asked Dr. Victorino Dike, who sees the patient, to evaluate her here.    AKI on stage III CKD: Baseline Cr ~1.2, was 1.55 on admission.  - Continue NS at 75cc/hr - Hold ACE. - Monitor BMP closely, especially with vanc/zosyn  Essential hypertension - Continue coreg, hydralazine. Hold lisinopril due to AKI, BPs soft.  Peripheral neuropathy: Idiopathic for ~15 years only long nerves without upper extremity involvement. No h/o DM, A1c 5.6%.  - Continue gabapentin - Takes vitamin B12 regularly  Depression - Continue wellbutrin and vortioxetine  Paroxysmal AFib: Rate is controlled, no indication for telemetry at this time. - Continue tikosyn, coreg and eliquis - I've notified Dr. Lubertha Basque office that she will ne to reschedule her appointment for today.  Morbid obesity: BMI 43.5  DVT prophylaxis: Eliquis Code Status: Full Family Communication: None at bedside Disposition Plan: Continue inpatient management, anticipate  DC home eventually.  Consultants:   Orthopedics, Dr. Victorino Dike  Procedures:   None  Antimicrobials:  Vancomycin 9/10 >>  Zosyn 9/11 >>   Subjective: No fever, minimal pain in the left foot. Eating and drinking ok. No chest pain, dyspnea, palpitations, or bleeding.   Objective: BP (!) 138/46 (BP Location: Left Arm)   Pulse 77   Temp 98 F (36.7 C) (Oral)   Resp 19   Ht 5' 4.75" (1.645 m)   Wt 117.9 kg (260 lb)   LMP  (LMP Unknown)   SpO2 99%   BMI 43.60 kg/m   Gen: 66 y.o. female in no distress Pulm: Non-labored breathing room air. Clear to auscultation bilaterally.  CV: Regular rate and rhythm. No murmur, rub, or gallop. No JVD. GI: Abdomen soft, non-tender, non-distended, with normoactive bowel sounds. No organomegaly or masses felt. Ext: Left foot with 1+ pitting edema extending to above the ankle with dorsal erythema poorly defined extending from ~ 1in diameter wound with serous drainage without odor and surrounding callus. DP pulses poorly palpable, limited hair growth BL LE's. Pictures from admission on H&P. Skin: As above Neuro: Alert and oriented. No focal neurological deficits. Psych: Judgement and insight appear normal. Mood & affect appropriate.   Data Reviewed: I have personally reviewed following labs and imaging studies  CBC:  Recent Labs Lab 06/05/17 1956 06/06/17 0258  WBC 8.8 8.8  HGB 10.7* 10.2*  HCT 35.5* 34.1*  MCV 91.0 91.4  PLT 419* 400   Basic Metabolic Panel:  Recent Labs Lab 06/05/17 1956 06/06/17 0258  NA 137 138  K 4.3 4.0  CL 110 111  CO2 18* 20*  GLUCOSE 93 131*  BUN 35* 32*  CREATININE 1.55* 1.56*  CALCIUM 9.3 8.9   GFR: Estimated Creatinine Clearance: 45.4 mL/min (A) (by C-G formula based on SCr of 1.56 mg/dL (H)). Liver Function Tests: No results for input(s): AST, ALT, ALKPHOS, BILITOT, PROT, ALBUMIN in the last 168 hours. No results for input(s): LIPASE, AMYLASE in the last 168 hours. No results for input(s):  AMMONIA in the last 168 hours. Coagulation Profile: No results for input(s): INR, PROTIME in the last 168 hours. Cardiac Enzymes: No results for input(s): CKTOTAL, CKMB, CKMBINDEX, TROPONINI in the last 168 hours. BNP (last 3 results) No results for input(s): PROBNP in the last 8760 hours. HbA1C:  Recent Labs  06/06/17 0258  HGBA1C 5.6   CBG: No results for input(s): GLUCAP in the last 168 hours. Lipid Profile: No results for input(s): CHOL, HDL, LDLCALC, TRIG, CHOLHDL, LDLDIRECT in the last 72 hours. Thyroid Function Tests: No results for input(s): TSH, T4TOTAL, FREET4, T3FREE, THYROIDAB in the last 72 hours. Anemia Panel: No results for input(s): VITAMINB12, FOLATE, FERRITIN, TIBC, IRON, RETICCTPCT in the last 72 hours. Urine analysis:    Component Value Date/Time   COLORURINE YELLOW 06/06/2017 0758   APPEARANCEUR CLEAR 06/06/2017 0758   LABSPEC 1.013 06/06/2017 0758   PHURINE 5.0 06/06/2017 0758   GLUCOSEU NEGATIVE 06/06/2017 0758   HGBUR NEGATIVE 06/06/2017 0758   BILIRUBINUR NEGATIVE 06/06/2017 0758   BILIRUBINUR neg 04/21/2015 0956   KETONESUR NEGATIVE 06/06/2017 0758   PROTEINUR NEGATIVE 06/06/2017 0758   UROBILINOGEN 1.0 06/12/2015 1732   NITRITE NEGATIVE 06/06/2017 0758   LEUKOCYTESUR SMALL (A) 06/06/2017 0758   Recent Results (from the past 240 hour(s))  Blood culture (routine x 2)     Status: None (Preliminary result)   Collection Time: 06/05/17  7:58 PM  Result Value Ref Range Status   Specimen Description BLOOD LEFT ANTECUBITAL  Final   Special Requests IN PEDIATRIC BOTTLE Blood Culture adequate volume  Final   Culture NO GROWTH < 12 HOURS  Final   Report Status PENDING  Incomplete  Blood culture (routine x 2)     Status: None (Preliminary result)   Collection Time: 06/05/17  8:03 PM  Result Value Ref Range Status   Specimen Description BLOOD LEFT ANTECUBITAL  Final   Special Requests IN PEDIATRIC BOTTLE Blood Culture adequate volume  Final   Culture  NO GROWTH < 12 HOURS  Final   Report Status PENDING  Incomplete  Aerobic Culture (superficial specimen)     Status: None (Preliminary result)   Collection Time: 06/05/17  8:32 PM  Result Value Ref Range Status   Specimen Description WOUND LEFT FOOT  Final   Special Requests NONE  Final   Gram Stain   Final    FEW WBC PRESENT, PREDOMINANTLY PMN MODERATE GRAM POSITIVE COCCI FEW GRAM POSITIVE RODS FEW GRAM NEGATIVE RODS    Culture PENDING  Incomplete   Report Status PENDING  Incomplete      Radiology Studies: Dg Foot 2 Views Left  Result Date: 06/05/2017 CLINICAL DATA:  Lateral foot ulcer for 10 days. EXAM: LEFT FOOT - 2 VIEW COMPARISON:  MRI 03/31/2017 FINDINGS: Advanced degenerative changes at the first MTP joint with a marked hallux valgus deformity. No acute bony findings. The other MTP joints are maintained. Suspect prior surgery involving the fifth metatarsal with a lateral osteotomy. There is marked soft tissue swelling over the lateral aspect of the fifth toe and fifth MTP joint but I do not see any definite destructive bony changes  to suggest osteomyelitis. Mild pes cavus. Small calcaneal heel spur. IMPRESSION: Significant soft tissue swelling over the lateral aspect of the forefoot Suspect prior postoperative changes involving the fifth metatarsal head but no findings to suggest osteomyelitis. First MTP joint degenerative changes and marked hallux valgus deformity. Moderate midfoot degenerative changes. Electronically Signed   By: Rudie Meyer M.D.   On: 06/05/2017 20:50    Scheduled Meds: . amLODipine  10 mg Oral QHS  . apixaban  5 mg Oral BID  . buPROPion  150 mg Oral q morning - 10a  . carvedilol  12.5 mg Oral BID WC  . dofetilide  500 mcg Oral BID  . gabapentin  600 mg Oral QID  . hydrALAZINE  75 mg Oral TID  . iron polysaccharides  150 mg Oral Daily  . lisinopril  40 mg Oral Daily  . mirtazapine  15 mg Oral QHS  . potassium chloride  20 mEq Oral Daily  . vortioxetine  HBr  20 mg Oral Daily   Continuous Infusions: . sodium chloride 75 mL/hr at 06/05/17 2352  . [START ON 06/07/2017] vancomycin       LOS: 1 day   Time spent: 25 minutes.  Hazeline Junker, MD [facility] Pager [phone]  If 7PM-7AM, please contact night-coverage [email or web address] Password [email or web address] 06/06/2017, 11:17 AM

## 2017-06-06 NOTE — Consult Note (Addendum)
WOC consult requested for left foot wound.  EMR indicates that patient was followed by Dr Victorino DikeHewitt of the ortho service and primary team plans to consult him for further plan of care, according to the EMR.  Reviewed photos in the chart; moist gauze dressing orders provided for nurses to apply until  further plan of care is provided by the ortho service. Please re-consult if further assistance is needed.  Thank-you,  Cammie Mcgeeawn Oswin Griffith MSN, RN, CWOCN, [place]-AP, CNS [phone]

## 2017-06-07 ENCOUNTER — Inpatient Hospital Stay (HOSPITAL_COMMUNITY): Payer: PPO

## 2017-06-07 DIAGNOSIS — L03119 Cellulitis of unspecified part of limb: Secondary | ICD-10-CM

## 2017-06-07 LAB — BASIC METABOLIC PANEL
ANION GAP: 6 (ref 5–15)
BUN: 26 mg/dL — ABNORMAL HIGH (ref 6–20)
CALCIUM: 8.4 mg/dL — AB (ref 8.9–10.3)
CO2: 18 mmol/L — ABNORMAL LOW (ref 22–32)
Chloride: 114 mmol/L — ABNORMAL HIGH (ref 101–111)
Creatinine, Ser: 1.38 mg/dL — ABNORMAL HIGH (ref 0.44–1.00)
GFR, EST AFRICAN AMERICAN: 45 mL/min — AB (ref >60)
GFR, EST NON AFRICAN AMERICAN: 39 mL/min — AB (ref >60)
GLUCOSE: 93 mg/dL (ref 65–99)
Potassium: 4.4 mmol/L (ref 3.5–5.1)
SODIUM: 138 mmol/L (ref 135–145)

## 2017-06-07 LAB — CBC
HCT: 33.3 % — ABNORMAL LOW (ref 36.0–46.0)
Hemoglobin: 9.9 g/dL — ABNORMAL LOW (ref 12.0–15.0)
MCH: 27.2 pg (ref 26.0–34.0)
MCHC: 29.7 g/dL — ABNORMAL LOW (ref 30.0–36.0)
MCV: 91.5 fL (ref 78.0–100.0)
PLATELETS: 377 10*3/uL (ref 150–400)
RBC: 3.64 MIL/uL — ABNORMAL LOW (ref 3.87–5.11)
RDW: 16.2 % — AB (ref 11.5–15.5)
WBC: 9.2 10*3/uL (ref 4.0–10.5)

## 2017-06-07 LAB — MAGNESIUM: Magnesium: 1.8 mg/dL (ref 1.7–2.4)

## 2017-06-07 MED ORDER — INFLUENZA VAC SPLIT HIGH-DOSE 0.5 ML IM SUSY
0.5000 mL | PREFILLED_SYRINGE | INTRAMUSCULAR | Status: DC
  Filled 2017-06-07 (×2): qty 0.5

## 2017-06-07 MED ORDER — DEXTROSE 5 % IV SOLN
1.0000 g | Freq: Three times a day (TID) | INTRAVENOUS | Status: DC
  Administered 2017-06-07 – 2017-06-09 (×6): 1 g via INTRAVENOUS
  Filled 2017-06-07 (×7): qty 1

## 2017-06-07 NOTE — Progress Notes (Deleted)
Patient ID: [name]                 DOB: April 29, 1951                      MRN: [id number]     HPI: [name] is a 66 y.o. female patient of Dr. Anne Fu referred by Norma Fredrickson NP to HTN clinic. PMH is significant for PAF, bipolar disorder, obesity, HTN, GERD, and OSA. Pt was initiated on Tikosyn in June, and HCTZ and verapamil were stopped. On 6/27, pt called complaining of headaches, nausea, and elevated BP in the 200s/100s. Carvedilol dose was increased to 12.5 mg BID, hydralazine increased to 50 mg TID, and imdur 15 mg daily was started. She was seen in office by Lawson Fiscal for HTN on 7/2 and noted that her home BP has been high in the mornings w/ significant headaches. Imdur 15 mg daily was stopped, and amlodipine 5 mg daily was added. She followed up in office 1 week later, and reported her readings have come down and headaches are now gone. BP was 180/80 in the office, and her hydralazine dose was increased to 75 mg TID. Pt was referred to HTN clinic for management.   At her last HTN clinic visit on 05/17/17, blood pressure was elevated at 148/62 mmHg. Her amlodipine was increased from 5 mg daily to 10 mg daily. All other antihypertensives were continued as prescribed. She was advised to decrease sodium and soda intake. --- Patient presents today feeling ***. She reports she is tolerating her increase in amlodipine and does not report*** any ADRs. She denies*** dizziness, headaches, recent falls, or syncope. She states her fatigue has *** improved. Her left leg continues to be swollen, but has not increased*** since baseline.  She has been making strides in decreasing her soda/salt intake.  Current HTN meds:  Amlodipine 10 mg daily (10 pm) carvedilol 12.5 mg BID (8:30am and 10pm) hydralazine 75 mg TID (8:30am, 2pm, 10pm) lisinopril 40 mg daily (8:30am)  Previously tried: lopressor - hair loss  BP goal: < 130/80 mmHg  Family History: The patient's family history includes Multiple  sclerosis in her sister and sister; Sleep apnea in her father  Social History: Pt is a former smoker, quit in 50. Denies alcohol or illicit use.   Diet: For breakfast may have eggs, oatmeal, or sausage biscuit. For lunch, eats tuna or hamburger. For dinner, eats TV dinners. Likes fruits and vegetables. Trying to cut back on adding sodium to food. No coffee or tea. Usually has 1 soda a day.   Exercise: Minimal due to having a boot and bad balance. Walks around the house.   Home BP readings: In the mornings, readings are usually 150s to 170s, some values in the 180s/60s to 70s. Afternoon and evening values are more controlled in the 130s to 150s/60s to 70s.   Wt Readings from Last 3 Encounters:  06/05/17 260 lb (117.9 kg)  04/03/17 261 lb 12.8 oz (118.8 kg)  03/27/17 264 lb 6.4 oz (119.9 kg)   BP Readings from Last 3 Encounters:  06/07/17 (!) 128/45  05/17/17 (!) 148/62  04/03/17 (!) 180/80   Pulse Readings from Last 3 Encounters:  06/07/17 63  05/17/17 (!) 51  04/03/17 62    Renal function: Estimated Creatinine Clearance: 51.3 mL/min (A) (by C-G formula based on SCr of 1.38 mg/dL (H)).  Past Medical History:  Diagnosis Date  . Anxiety   . Arthritis  OA AND PAIN IN BOTH KNEES-LEFT WORSE.  PT ALSO HAS LOWER BACK PAIN AT TIMES  . Depression   . GERD (gastroesophageal reflux disease)    RARE- OTC ANTACID IF NEEDED--PAST HX OF ULCER  . Headache    wakes up with headache   . Hyperlipidemia   . Hypertension   . Kidney stones    PT PASSED STONES--NO KNOWN STONES AT PRESENT TIMES  . Lumbago 10/02/2013  . Obesity   . OSA (obstructive sleep apnea) 12/30/2013   Very mild OSA with REM accentuation,  [facility], Dr Frances FurbishAthar .   Marland Kitchen. Periodic limb movement disorder (PLMD) 12/30/2013  . Restless leg syndrome   . Ulcer   . Urinary incontinence     Current Facility-Administered Medications on File Prior to Visit  Medication Dose Route Frequency Provider Last Rate  Last Dose  . 0.9 %  sodium chloride infusion   Intravenous Continuous Madelyn FlavorsSmith, Rondell A, MD 75 mL/hr at 06/07/17 1034    . acetaminophen (TYLENOL) tablet 650 mg  650 mg Oral Q6H PRN Clydie BraunSmith, Rondell A, MD       Or  . acetaminophen (TYLENOL) suppository 650 mg  650 mg Rectal Q6H PRN Smith, Rondell A, MD      . albuterol (PROVENTIL) (2.5 MG/3ML) 0.083% nebulizer solution 2.5 mg  2.5 mg Nebulization Q4H PRN Smith, Rondell A, MD      . amLODipine (NORVASC) tablet 10 mg  10 mg Oral QHS Smith, Rondell A, MD   10 mg at 06/06/17 2222  . apixaban (ELIQUIS) tablet 5 mg  5 mg Oral BID Madelyn FlavorsSmith, Rondell A, MD   5 mg at 06/07/17 0957  . buPROPion (WELLBUTRIN XL) 24 hr tablet 150 mg  150 mg Oral q morning - 10a Madelyn FlavorsSmith, Rondell A, MD   150 mg at 06/07/17 0957  . carvedilol (COREG) tablet 12.5 mg  12.5 mg Oral BID WC Smith, Rondell A, MD   12.5 mg at 06/07/17 0855  . cefTAZidime (FORTAZ) 1 g in dextrose 5 % 50 mL IVPB  1 g Intravenous Q12H Tyrone NineGrunz, Ryan B, MD   Stopped at 06/07/17 1035  . diphenhydrAMINE (BENADRYL) capsule 25-50 mg  25-50 mg Oral Q6H PRN Opyd, Lavone Neriimothy S, MD   50 mg at 06/07/17 0855  . dofetilide (TIKOSYN) capsule 500 mcg  500 mcg Oral BID Madelyn FlavorsSmith, Rondell A, MD   500 mcg at 06/07/17 0855  . gabapentin (NEURONTIN) tablet 600 mg  600 mg Oral QID Madelyn FlavorsSmith, Rondell A, MD   600 mg at 06/07/17 0957  . hydrALAZINE (APRESOLINE) tablet 75 mg  75 mg Oral TID Clydie BraunSmith, Rondell A, MD   75 mg at 06/07/17 0957  . iron polysaccharides (NIFEREX) capsule 150 mg  150 mg Oral Daily Katrinka BlazingSmith, Rondell A, MD   150 mg at 06/07/17 0957  . meclizine (ANTIVERT) tablet 25 mg  25 mg Oral TID PRN Madelyn FlavorsSmith, Rondell A, MD      . mirtazapine (REMERON) tablet 15 mg  15 mg Oral QHS Smith, Rondell A, MD   15 mg at 06/06/17 2223  . ondansetron (ZOFRAN) tablet 4 mg  4 mg Oral Q6H PRN Madelyn FlavorsSmith, Rondell A, MD       Or  . ondansetron (ZOFRAN) injection 4 mg  4 mg Intravenous Q6H PRN Smith, Rondell A, MD      . potassium chloride (K-DUR,KLOR-CON) CR tablet 20  mEq  20 mEq Oral Daily Katrinka BlazingSmith, Rondell A, MD   20 mEq at 06/07/17  0957  . vancomycin (VANCOCIN) 1,250 mg in sodium chloride 0.9 % 250 mL IVPB  1,250 mg Intravenous Q24H Cherre Huger, [place]   Stopped at 06/07/17 0113  . vortioxetine HBr (TRINTELLIX) 20 MG tablet 20 mg  20 mg Oral Daily Cherre Huger, [place]   20 mg at 06/07/17 1610   Current Outpatient Prescriptions on File Prior to Visit  Medication Sig Dispense Refill  . acetaminophen (TYLENOL) 325 MG tablet Take 650 mg by mouth every 6 (six) hours as needed for mild pain.    Marland Kitchen amLODipine (NORVASC) 10 MG tablet Take 1 tablet (10 mg total) by mouth daily. 30 tablet 1  . buPROPion (WELLBUTRIN XL) 150 MG 24 hr tablet Take 150 mg by mouth every morning.    . carvedilol (COREG) 12.5 MG tablet Take 1 tablet (12.5 mg total) by mouth 2 (two) times daily with a meal. 180 tablet 1  . cyanocobalamin (,VITAMIN B-12,) 1000 MCG/ML injection Inject 1,000 mcg into the muscle every 30 (thirty) days.    Marland Kitchen dofetilide (TIKOSYN) 500 MCG capsule Take 1 capsule (500 mcg total) by mouth 2 (two) times daily. 180 capsule 1  . ELIQUIS 5 MG TABS tablet Take 5 mg by mouth 2 (two) times daily.    Marland Kitchen gabapentin (NEURONTIN) 600 MG tablet Take 600 mg by mouth 4 (four) times daily.    . hydrALAZINE (APRESOLINE) 50 MG tablet Take 1.5 tablets (75 mg total) by mouth 3 (three) times daily. 405 tablet 3  . iron polysaccharides (NIFEREX) 150 MG capsule Take 150 mg by mouth daily.    Marland Kitchen lisinopril (PRINIVIL,ZESTRIL) 40 MG tablet Take 1 tablet (40 mg total) by mouth daily. 90 tablet 3  . meclizine (ANTIVERT) 25 MG tablet Take 25 mg by mouth 3 (three) times daily as needed for dizziness.    . mirtazapine (REMERON) 15 MG tablet Take 15 mg by mouth at bedtime.    . Multiple Vitamins-Minerals (MULTIVITAMIN & MINERAL PO) Take 1 tablet by mouth daily.    . potassium chloride (K-DUR) 10 MEQ tablet Take 2 tablets (20 mEq total) by mouth daily. 180 tablet 2  . vortioxetine HBr (TRINTELLIX) 10  MG TABS Take 10 mg by mouth 2 (two) times daily.       Allergies  Allergen Reactions  . Zolpidem Tartrate Other (See Comments)    hallucinations  . Amoxicillin Rash     Assessment/Plan: Hypertension: Blood pressure is above her goal of <130/80 mmHg today at ***.  ***add more potent arb or clonidine?   Diana L. Marcy Salvo, PharmD, MS PGY1 Pharmacy Resident [facility]

## 2017-06-07 NOTE — Progress Notes (Signed)
PROGRESS NOTE    Yvette ShutterCarolyn I Orozco  ZOX:[id number] DOB: 09-Jul-1951 DOA: 06/05/2017 PCP: Jarome MatinPaterson, Daniel, MD    Brief Narrative: 66 yo female admitted with cellulitis of left foot.patient is a diabetic.    Assessment & Plan:   Principal Problem:   Cellulitis of foot Active Problems:   Essential hypertension   Morbid obesity (HCC)   Paroxysmal atrial fibrillation (HCC)   Open wound of left foot cellulitis continue vanc and ceftaz. HTNCONTINUE NORVASC,COREG, AFIB CONTINUE ELIQUIS.     DVT prophylaxisELIQUIS Code Status: FULL Family Communication:  Disposition Plan:    Consultants: ORTHO   Procedures:   Antimicrobials: VANC,CEFTAZ   Subjective:NO NEW COMPLAINTS   Objective: Vitals:   06/06/17 1400 06/06/17 2216 06/07/17 0435 06/07/17 1400  BP: (!) 105/58 (!) 144/49 (!) 128/45 (!) 116/47  Pulse: (!) 53 65 63 (!) 52  Resp: (!) 22 19 19    Temp: 98.2 F (36.8 C) 98 F (36.7 C) 98.1 F (36.7 C) 97.9 F (36.6 C)  TempSrc: Oral Oral Oral Oral  SpO2: 98% 98% 98% 97%  Weight:      Height:        Intake/Output Summary (Last 24 hours) at 06/07/17 1730 Last data filed at 06/07/17 1727  Gross per 24 hour  Intake             3210 ml  Output             2950 ml  Net              260 ml   Filed Weights   06/05/17 1918  Weight: 117.9 kg (260 lb)    Examination:  General exam: Appears calm and comfortable  Respiratory system: Clear to auscultation. Respiratory effort normal. Cardiovascular system: S1 & S2 heard, RRR. No JVD, murmurs, rubs, gallops or clicks. No pedal edema. Gastrointestinal system: Abdomen is nondistended, soft and nontender. No organomegaly or masses felt. Normal bowel sounds heard. Central nervous system: Alert and oriented. No focal neurological deficits. Extremities: ERYTHEMA DECREASED. Skin: No rashes, lesions or ulcers Psychiatry: Judgement and insight appear normal. Mood & affect appropriate.     Data Reviewed: I have personally  reviewed following labs and imaging studies  CBC:  Recent Labs Lab 06/05/17 1956 06/06/17 0258 06/07/17 0433  WBC 8.8 8.8 9.2  HGB 10.7* 10.2* 9.9*  HCT 35.5* 34.1* 33.3*  MCV 91.0 91.4 91.5  PLT 419* 400 377   Basic Metabolic Panel:  Recent Labs Lab 06/05/17 1956 06/06/17 0258 06/07/17 0433  NA 137 138 138  K 4.3 4.0 4.4  CL 110 111 114*  CO2 18* 20* 18*  GLUCOSE 93 131* 93  BUN 35* 32* 26*  CREATININE 1.55* 1.56* 1.38*  CALCIUM 9.3 8.9 8.4*  MG  --   --  1.8   GFR: Estimated Creatinine Clearance: 51.3 mL/min (A) (by C-G formula based on SCr of 1.38 mg/dL (H)). Liver Function Tests: No results for input(s): AST, ALT, ALKPHOS, BILITOT, PROT, ALBUMIN in the last 168 hours. No results for input(s): LIPASE, AMYLASE in the last 168 hours. No results for input(s): AMMONIA in the last 168 hours. Coagulation Profile: No results for input(s): INR, PROTIME in the last 168 hours. Cardiac Enzymes: No results for input(s): CKTOTAL, CKMB, CKMBINDEX, TROPONINI in the last 168 hours. BNP (last 3 results) No results for input(s): PROBNP in the last 8760 hours. HbA1C:  Recent Labs  06/06/17 0258  HGBA1C 5.6   CBG: No results for input(s): GLUCAP in  the last 168 hours. Lipid Profile: No results for input(s): CHOL, HDL, LDLCALC, TRIG, CHOLHDL, LDLDIRECT in the last 72 hours. Thyroid Function Tests: No results for input(s): TSH, T4TOTAL, FREET4, T3FREE, THYROIDAB in the last 72 hours. Anemia Panel: No results for input(s): VITAMINB12, FOLATE, FERRITIN, TIBC, IRON, RETICCTPCT in the last 72 hours. Sepsis Labs:  Recent Labs Lab 06/05/17 2021  LATICACIDVEN 1.18    Recent Results (from the past 240 hour(s))  Blood culture (routine x 2)     Status: None (Preliminary result)   Collection Time: 06/05/17  7:58 PM  Result Value Ref Range Status   Specimen Description BLOOD LEFT ANTECUBITAL  Final   Special Requests IN PEDIATRIC BOTTLE Blood Culture adequate volume  Final    Culture NO GROWTH 2 DAYS  Final   Report Status PENDING  Incomplete  Blood culture (routine x 2)     Status: None (Preliminary result)   Collection Time: 06/05/17  8:03 PM  Result Value Ref Range Status   Specimen Description BLOOD LEFT ANTECUBITAL  Final   Special Requests IN PEDIATRIC BOTTLE Blood Culture adequate volume  Final   Culture NO GROWTH 2 DAYS  Final   Report Status PENDING  Incomplete  Aerobic Culture (superficial specimen)     Status: None (Preliminary result)   Collection Time: 06/05/17  8:32 PM  Result Value Ref Range Status   Specimen Description WOUND LEFT FOOT  Final   Special Requests NONE  Final   Gram Stain   Final    FEW WBC PRESENT, PREDOMINANTLY PMN MODERATE GRAM POSITIVE COCCI FEW GRAM POSITIVE RODS FEW GRAM NEGATIVE RODS    Culture   Final    MODERATE GROUP B STREP(S.AGALACTIAE)ISOLATED TESTING AGAINST S. AGALACTIAE NOT ROUTINELY PERFORMED DUE TO PREDICTABILITY OF AMP/PEN/VAN SUSCEPTIBILITY.    Report Status PENDING  Incomplete         Radiology Studies: Mri Left Foot With And Without Contrast  Result Date: 06/07/2017 CLINICAL DATA:  Nonhealing wound on the lateral aspect of the left foot for approximately 2 weeks. EXAM: MRI OF THE LEFT FOREFOOT WITHOUT AND WITH CONTRAST TECHNIQUE: Multiplanar, multisequence MR imaging of the left forefoot was performed both before and after administration of intravenous contrast. CONTRAST:  10 ml MULTIHANCE GADOBENATE DIMEGLUMINE 529 MG/ML IV SOLN COMPARISON:  Plain films left foot 06/05/2017 and MRI left foot 03/31/2017. FINDINGS: Bones/Joint/Cartilage No bone marrow signal abnormality to suggest osteomyelitis is identified. 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 There is also some midfoot degenerative change. No fracture. Ligaments Intact. Muscles and Tendons There is mild atrophy of intrinsic musculature the foot. No intramuscular fluid collection or focal lesion. Soft tissues Skin ulceration along the  fifth MTP joint is seen. No underlying abscess. Subcutaneous edema over the dorsum of the foot is noted. No abscess. No joint effusion. IMPRESSION: Skin wound at the fifth MTP joint without underlying abscess, septic joint or osteomyelitis. Hallux valgus and first MTP osteoarthritis. Midfoot degenerative change also noted. Electronically Signed   By: Drusilla Kanner M.D.   On: 06/07/2017 00:35   Dg Foot 2 Views Left  Result Date: 06/05/2017 CLINICAL DATA:  Lateral foot ulcer for 10 days. EXAM: LEFT FOOT - 2 VIEW COMPARISON:  MRI 03/31/2017 FINDINGS: Advanced degenerative changes at the first MTP joint with a marked hallux valgus deformity. No acute bony findings. The other MTP joints are maintained. Suspect prior surgery involving the fifth metatarsal with a lateral osteotomy. There is marked soft tissue swelling over the lateral aspect  of the fifth toe and fifth MTP joint but I do not see any definite destructive bony changes to suggest osteomyelitis. Mild pes cavus. Small calcaneal heel spur. IMPRESSION: Significant soft tissue swelling over the lateral aspect of the forefoot Suspect prior postoperative changes involving the fifth metatarsal head but no findings to suggest osteomyelitis. First MTP joint degenerative changes and marked hallux valgus deformity. Moderate midfoot degenerative changes. Electronically Signed   By: Rudie Meyer M.D.   On: 06/05/2017 20:50        Scheduled Meds: . amLODipine  10 mg Oral QHS  . apixaban  5 mg Oral BID  . buPROPion  150 mg Oral q morning - 10a  . carvedilol  12.5 mg Oral BID WC  . dofetilide  500 mcg Oral BID  . gabapentin  600 mg Oral QID  . hydrALAZINE  75 mg Oral TID  . [START ON 06/08/2017] Influenza vac split quadrivalent PF  0.5 mL Intramuscular Tomorrow-1000  . iron polysaccharides  150 mg Oral Daily  . mirtazapine  15 mg Oral QHS  . potassium chloride  20 mEq Oral Daily  . vortioxetine HBr  20 mg Oral Daily   Continuous Infusions: . sodium  chloride 75 mL/hr at 06/07/17 1034  . cefTAZidime (FORTAZ)  IV    . vancomycin Stopped (06/07/17 0113)     LOS: 2 days     Alwyn Ren, MD [facility]  If 7PM-7AM, please contact night-coverage [email or web address] Password [facility] 06/07/2017, 5:30 PM

## 2017-06-07 NOTE — Progress Notes (Signed)
Subjective:     Patient reports pain as mild.  Reports that her foot continues to improve.  Tolerating POs well.  Admits to flatus.  Resting comfortably.  Reports that she had her MRI.  Objective:   VITALS:  Temp:  [97.9 F (36.6 C)-98.1 F (36.7 C)] 97.9 F (36.6 C) (09/12 1400) Pulse Rate:  [52-65] 52 (09/12 1400) Resp:  [19] 19 (09/12 0435) BP: (116-144)/(45-49) 116/47 (09/12 1400) SpO2:  [97 %-98 %] 97 % (09/12 1400)  General: WDWN patient in NAD. Psych:  Appropriate mood and affect. Neuro:  A&O x 3, Moving all extremities, sensation intact to light touch HEENT:  EOMs intact Chest:  Even non-labored respirations Skin: L lateral foot ulcer, no active drainage today.   Extremities: warm/dry, mild edema and erythema extending to 5th toe.  No lymphadenopathy. Pulses: Dorsalis pedis and post tibialis 1+ MSK:  ROM: patient can actively DF/PF lesser toes, MMT: patient is able to perform quad set, (-) Homan's    LABS  Recent Labs  06/05/17 1956 06/06/17 0258 06/07/17 0433  HGB 10.7* 10.2* 9.9*  WBC 8.8 8.8 9.2  PLT 419* 400 377    Recent Labs  06/06/17 0258 06/07/17 0433  NA 138 138  K 4.0 4.4  CL 111 114*  CO2 20* 18*  BUN 32* 26*  CREATININE 1.56* 1.38*  GLUCOSE 131* 93   No results for input(s): LABPT, INR in the last 72 hours.   Assessment/Plan:     Left foot ulcer and cellulitis  MRI is negative for osteomyelitis Wound care and ABX per medicine team. Dressing changes PRN Patient will f/u with Dr. Victorino DikeHewitt in the outpatient setting PRN. WBAT L LE in flat post-op shoe. Ortho signing off.  Alfredo MartinezJustin Zyir Gassert, PA-C [facility] Office:  [phone]

## 2017-06-07 NOTE — Progress Notes (Signed)
VASCULAR LAB PRELIMINARY  ARTERIAL  ABI completed: Right ABI of 1.02 is suggestive of arterial flow within normal limits at rest. Left ABI of 0.73 is suggestive of moderate arterial occlusive disease at rest. Right TBI of 0.52 and left TBI of 0.4 are suggestive of abnormal arterial flow at rest.   RIGHT    LEFT    PRESSURE WAVEFORM  PRESSURE WAVEFORM  BRACHIAL 139 Triphasic BRACHIAL 138 Triphasic  DP 129 Biphasic DP 100 Monophasic  PT 142 Biphasic PT 101 Biphasic  GREAT TOE 72 NA GREAT TOE 56 NA    RIGHT LEFT  ABI 1.02 0.73     [name] Nobbe, RVT 06/07/2017, 8:49 AM

## 2017-06-07 NOTE — Consult Note (Signed)
   [facility] CM [facility] Consult   06/07/2017  Yvette ShutterCarolyn I Orozco Nov 29, 1950 [id number]    Spoke with [name] at bedside to discuss [facility] program on behalf of her [employer] insurance.   [name] lives alone. Confirmed Primary Care MD as Dr. Eloise HarmanPaterson. Denies having any concerns with medications or with transportation. States her friend assists her transportation to MD appointments if needed.   [name] does not have any current [facility] needs. However, she is agreeable to general EMMI post hospital transition calls.  [facility] written consent obtained if she should need [facility] services in the future.   [facility] packet provided.   Will make referral for EMMI general transition calls.   Will make inpatient RNCM aware.    [name], MSN-Ed, RN,BSN [facility] Care [facility] Hospital Liaison [phone]

## 2017-06-07 NOTE — Care Management Note (Signed)
Case Management Note  Patient Details  Name: [name] MRN: [id number] Date of Birth: [birth year]  Subjective/Objective:                    Action/Plan: 06-07-17 Consult for home health needs. Awaiting work up results and will need home health RN order and face to face with wound care instructions. Thanks Ronny FlurryHeather Jaelen Soth RN [phone]   Expected Discharge Date:  06/09/17               Expected Discharge Plan:  Home w Home Health Services  In-House Referral:     Discharge planning Services  CM Consult  Post Acute Care Choice:  Home Health Choice offered to:     DME Arranged:    DME Agency:     HH Arranged:    HH Agency:     Status of Service:  In process, will continue to follow  If discussed at Long Length of Stay Meetings, dates discussed:    Additional Comments:  Kingsley PlanWile, Drucella Karbowski Marie, RN 06/07/2017, 11:08 AM

## 2017-06-08 ENCOUNTER — Encounter: Payer: Self-pay | Admitting: Internal Medicine

## 2017-06-08 ENCOUNTER — Ambulatory Visit: Payer: PPO | Admitting: Pharmacist

## 2017-06-08 NOTE — Care Management Note (Signed)
Case Management Note  Patient Details  Name: Enriqueta ShutterCarolyn I Yaffe MRN: [id number] Date of Birth: 1951-08-16  Subjective/Objective:                    Action/Plan:   Expected Discharge Date:  06/09/17               Expected Discharge Plan:  Home w Home Health Services  In-House Referral:     Discharge planning Services  CM Consult  Post Acute Care Choice:  Home Health Choice offered to:  Patient  DME Arranged:    DME Agency:     HH Arranged:  RN HH Agency:  [facility]  Status of Service:  Completed, signed off  If discussed at Long Length of Stay Meetings, dates discussed:    Additional Comments:  Kingsley PlanWile, Jefferey Lippmann Marie, RN 06/08/2017, 1:50 PM

## 2017-06-08 NOTE — Progress Notes (Signed)
PROGRESS NOTE    [name]  ZOX:[id number] DOB: [birth year] DOA: 06/05/2017 PCP: Jarome MatinPaterson, Daniel, MD   Brief Narrative: 89 yofemale with cellulitis no evidence of osteomyelitis by mri.   Assessment & Plan:   Principal Problem:   Cellulitis of foot Active Problems:   Essential hypertension   Morbid obesity (HCC)   Paroxysmal atrial fibrillation (HCC)   Open wound of left foot  idiopathic neuropathy  With cellulitis.will switch to po antibiotics in am.   DVT prophylaxis: lovenox Code Status: full Family Communication: none  Disposition Plan: home tomorrow with hh rn on po antibiotics.will switch to keflex 500 mg bid.   Consultants:   Procedures:  Antimicrobials:    Subjective:   Objective: Vitals:   06/07/17 2104 06/07/17 2251 06/08/17 0501 06/08/17 1211  BP: (!) 85/55 (!) 116/41 (!) 158/58 (!) 123/59  Pulse:   87 (!) 53  Resp: 19  18 18   Temp: 98.1 F (36.7 C)  97.7 F (36.5 C) 98.6 F (37 C)  TempSrc: Oral  Oral Oral  SpO2: 98%  98% 97%  Weight:      Height:        Intake/Output Summary (Last 24 hours) at 06/08/17 1250 Last data filed at 06/08/17 1206  Gross per 24 hour  Intake           1707.5 ml  Output             1600 ml  Net            107.5 ml   Filed Weights   06/05/17 1918  Weight: 117.9 kg (260 lb)    Examination:  General exam: Appears calm and comfortable  Respiratory system: Clear to auscultation. Respiratory effort normal. Cardiovascular system: S1 & S2 heard, RRR. No JVD, murmurs, rubs, gallops or clicks. No pedal edema. Gastrointestinal system: Abdomen is nondistended, soft and nontender. No organomegaly or masses felt. Normal bowel sounds heard. Central nervous system: Alert and oriented. No focal neurological deficits. Extremities: Symmetric 5 x 5 power. Skin: No rashes, lesions or ulcers Psychiatry: Judgement and insight appear normal. Mood & affect appropriate.     Data Reviewed: I have personally reviewed  following labs and imaging studies  CBC:  Recent Labs Lab 06/05/17 1956 06/06/17 0258 06/07/17 0433  WBC 8.8 8.8 9.2  HGB 10.7* 10.2* 9.9*  HCT 35.5* 34.1* 33.3*  MCV 91.0 91.4 91.5  PLT 419* 400 377   Basic Metabolic Panel:  Recent Labs Lab 06/05/17 1956 06/06/17 0258 06/07/17 0433  NA 137 138 138  K 4.3 4.0 4.4  CL 110 111 114*  CO2 18* 20* 18*  GLUCOSE 93 131* 93  BUN 35* 32* 26*  CREATININE 1.55* 1.56* 1.38*  CALCIUM 9.3 8.9 8.4*  MG  --   --  1.8   GFR: Estimated Creatinine Clearance: 51.3 mL/min (A) (by C-G formula based on SCr of 1.38 mg/dL (H)). Liver Function Tests: No results for input(s): AST, ALT, ALKPHOS, BILITOT, PROT, ALBUMIN in the last 168 hours. No results for input(s): LIPASE, AMYLASE in the last 168 hours. No results for input(s): AMMONIA in the last 168 hours. Coagulation Profile: No results for input(s): INR, PROTIME in the last 168 hours. Cardiac Enzymes: No results for input(s): CKTOTAL, CKMB, CKMBINDEX, TROPONINI in the last 168 hours. BNP (last 3 results) No results for input(s): PROBNP in the last 8760 hours. HbA1C:  Recent Labs  06/06/17 0258  HGBA1C 5.6   CBG: No results for input(s): GLUCAP  in the last 168 hours. Lipid Profile: No results for input(s): CHOL, HDL, LDLCALC, TRIG, CHOLHDL, LDLDIRECT in the last 72 hours. Thyroid Function Tests: No results for input(s): TSH, T4TOTAL, FREET4, T3FREE, THYROIDAB in the last 72 hours. Anemia Panel: No results for input(s): VITAMINB12, FOLATE, FERRITIN, TIBC, IRON, RETICCTPCT in the last 72 hours. Sepsis Labs:  Recent Labs Lab 06/05/17 2021  LATICACIDVEN 1.18    Recent Results (from the past 240 hour(s))  Blood culture (routine x 2)     Status: None (Preliminary result)   Collection Time: 06/05/17  7:58 PM  Result Value Ref Range Status   Specimen Description BLOOD LEFT ANTECUBITAL  Final   Special Requests IN PEDIATRIC BOTTLE Blood Culture adequate volume  Final   Culture  NO GROWTH 3 DAYS  Final   Report Status PENDING  Incomplete  Blood culture (routine x 2)     Status: None (Preliminary result)   Collection Time: 06/05/17  8:03 PM  Result Value Ref Range Status   Specimen Description BLOOD LEFT ANTECUBITAL  Final   Special Requests IN PEDIATRIC BOTTLE Blood Culture adequate volume  Final   Culture NO GROWTH 3 DAYS  Final   Report Status PENDING  Incomplete  Aerobic Culture (superficial specimen)     Status: None (Preliminary result)   Collection Time: 06/05/17  8:32 PM  Result Value Ref Range Status   Specimen Description WOUND LEFT FOOT  Final   Special Requests NONE  Final   Gram Stain   Final    FEW WBC PRESENT, PREDOMINANTLY PMN MODERATE GRAM POSITIVE COCCI FEW GRAM POSITIVE RODS FEW GRAM NEGATIVE RODS    Culture   Final    MODERATE GROUP B STREP(S.AGALACTIAE)ISOLATED TESTING AGAINST S. AGALACTIAE NOT ROUTINELY PERFORMED DUE TO PREDICTABILITY OF AMP/PEN/VAN SUSCEPTIBILITY.    Report Status PENDING  Incomplete         Radiology Studies: Mri Left Foot With And Without Contrast  Result Date: 06/07/2017 CLINICAL DATA:  Nonhealing wound on the lateral aspect of the left foot for approximately 2 weeks. EXAM: MRI OF THE LEFT FOREFOOT WITHOUT AND WITH CONTRAST TECHNIQUE: Multiplanar, multisequence MR imaging of the left forefoot was performed both before and after administration of intravenous contrast. CONTRAST:  10 ml MULTIHANCE GADOBENATE DIMEGLUMINE 529 MG/ML IV SOLN COMPARISON:  Plain films left foot 06/05/2017 and MRI left foot 03/31/2017. FINDINGS: Bones/Joint/Cartilage No bone marrow signal abnormality to suggest osteomyelitis is identified. 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 There is also some midfoot degenerative change. No fracture. Ligaments Intact. Muscles and Tendons There is mild atrophy of intrinsic musculature the foot. No intramuscular fluid collection or focal lesion. Soft tissues Skin ulceration along the fifth  MTP joint is seen. No underlying abscess. Subcutaneous edema over the dorsum of the foot is noted. No abscess. No joint effusion. IMPRESSION: Skin wound at the fifth MTP joint without underlying abscess, septic joint or osteomyelitis. Hallux valgus and first MTP osteoarthritis. Midfoot degenerative change also noted. Electronically Signed   By: Drusilla Kanner M.D.   On: 06/07/2017 00:35        Scheduled Meds: . amLODipine  10 mg Oral QHS  . apixaban  5 mg Oral BID  . buPROPion  150 mg Oral q morning - 10a  . carvedilol  12.5 mg Oral BID WC  . dofetilide  500 mcg Oral BID  . gabapentin  600 mg Oral QID  . hydrALAZINE  75 mg Oral TID  . Influenza vac split quadrivalent PF  0.5 mL Intramuscular Tomorrow-1000  . iron polysaccharides  150 mg Oral Daily  . mirtazapine  15 mg Oral QHS  . potassium chloride  20 mEq Oral Daily  . vortioxetine HBr  20 mg Oral Daily   Continuous Infusions: . sodium chloride 75 mL/hr at 06/08/17 0323  . cefTAZidime (FORTAZ)  IV 1 g (06/08/17 1017)  . vancomycin Stopped (06/08/17 0149)     LOS: 3 days     Alwyn Ren, MD [facility]  If 7PM-7AM, please contact night-coverage [email or web address] Password [email or web address] 06/08/2017, 12:50 PM

## 2017-06-08 NOTE — Discharge Instructions (Signed)

## 2017-06-08 NOTE — Progress Notes (Addendum)
Pharmacy Antibiotic Note  [name] is a 66 y.o. female admitted on 06/05/2017 with worsening wound infection of the left foot. Pharmacy has been consulted for vancomycin dosing. Patient afebrile and WBC within normal limits. LA is also normal at 1.18 Per MD, this is deep tissue infection and would like to aim for trough closer to 15 mcg/mL. Per xray report: no definitive changes to suggest osteo.  MD notes plan to change to oral antibiotics tomorrow. Today is day #4 of vancomycin, day #3 of ceftaz. L food culture is growing GBS.  9/10 vancomycin > 9/11 ceftazidime >  9/13  9/10 blood cx: ngtd 9/10 L foot wound cx: strep agalactiae   Plan: Vancomycin 250 mg IV q24h Ceftazidime 1g/8h Monitor renal function, clinical picture, and culture data F/u length of therapy and de-escalation  Check VT tomorrow or weekend if pt has not been transitioned to oral abx  Height: 5' 4.75" (164.5 cm) Weight: 260 lb (117.9 kg) IBW/kg (Calculated) : 56.43  Temp (24hrs), Avg:98.1 F (36.7 C), Min:97.7 F (36.5 C), Max:98.6 F (37 C)   Recent Labs Lab 06/05/17 1956 06/05/17 2021 06/06/17 0258 06/07/17 0433  WBC 8.8  --  8.8 9.2  CREATININE 1.55*  --  1.56* 1.38*  LATICACIDVEN  --  1.18  --   --     Estimated Creatinine Clearance: 51.3 mL/min (A) (by C-G formula based on SCr of 1.38 mg/dL (H)).    Allergies  Allergen Reactions  . Zolpidem Tartrate Other (See Comments)    hallucinations  . Amoxicillin Rash    Agapito GamesAlison Katilin Raynes, PharmD, BCPS Clinical Pharmacist 06/08/2017 1:09 PM

## 2017-06-09 LAB — AEROBIC CULTURE  (SUPERFICIAL SPECIMEN)

## 2017-06-09 LAB — AEROBIC CULTURE W GRAM STAIN (SUPERFICIAL SPECIMEN)

## 2017-06-09 MED ORDER — CEPHALEXIN 500 MG PO CAPS: 500.0000 mg | ORAL_CAPSULE | Freq: Two times a day (BID) | ORAL | 0 refills | Status: AC

## 2017-06-09 NOTE — Progress Notes (Signed)
Discharge home. Home discharge instruction given, no question verbalized. 

## 2017-06-09 NOTE — Discharge Summary (Signed)
Physician Discharge Summary  [name] ZOX:[id number] DOB: [birth year] DOA: 06/05/2017  PCP: Jarome Matin, MD  Admit date: 06/05/2017 Discharge date: 06/09/2017  Admitted Fromhome Disposition:  home  Recommendations for Outpatient Follow-up:  1. Follow up with PCP in 1-2 weeks 2. Please obtain BMP/CBC in one week  Home Health:yes Equipment/Devices:none  Discharge Condition:stable CODE STATUS:full Diet recommendation: heart healthy  Brief/Interim Summary:66 yo female with left foot cellulitis with no evidence of osteo.   Discharge Diagnoses:  Principal Problem:   Cellulitis of foot Active Problems:   Essential hypertension   Morbid obesity (HCC)   Paroxysmal atrial fibrillation (HCC)   Open wound of left foot  Cellulitis of left foot keflex 500 bid based on renal functions for 10 days.  Idiopathic neuropathy stable.    Discharge Instructions   Allergies as of 06/09/2017      Reactions   Zolpidem Tartrate Other (See Comments)   hallucinations   Amoxicillin Rash      Medication List    STOP taking these medications   cyanocobalamin 1000 MCG/ML injection Commonly known as:  (VITAMIN B-12)   potassium chloride 10 MEQ tablet Commonly known as:  K-DUR     TAKE these medications   acetaminophen 325 MG tablet Commonly known as:  TYLENOL Take 650 mg by mouth every 6 (six) hours as needed for mild pain.   amLODipine 10 MG tablet Commonly known as:  NORVASC Take 1 tablet (10 mg total) by mouth daily.   buPROPion 150 MG 24 hr tablet Commonly known as:  WELLBUTRIN XL Take 150 mg by mouth every morning.   carvedilol 12.5 MG tablet Commonly known as:  COREG Take 1 tablet (12.5 mg total) by mouth 2 (two) times daily with a meal.   cephALEXin 500 MG capsule Commonly known as:  KEFLEX Take 1 capsule (500 mg total) by mouth 2 (two) times daily.   dofetilide 500 MCG capsule Commonly known as:  TIKOSYN Take 1 capsule (500 mcg total) by mouth 2 (two)  times daily.   ELIQUIS 5 MG Tabs tablet Generic drug:  apixaban Take 5 mg by mouth 2 (two) times daily.   gabapentin 600 MG tablet Commonly known as:  NEURONTIN Take 600 mg by mouth 4 (four) times daily.   hydrALAZINE 50 MG tablet Commonly known as:  APRESOLINE Take 1.5 tablets (75 mg total) by mouth 3 (three) times daily.   iron polysaccharides 150 MG capsule Commonly known as:  NIFEREX Take 150 mg by mouth daily.   lisinopril 40 MG tablet Commonly known as:  PRINIVIL,ZESTRIL Take 1 tablet (40 mg total) by mouth daily.   meclizine 25 MG tablet Commonly known as:  ANTIVERT Take 25 mg by mouth 3 (three) times daily as needed for dizziness.   mirtazapine 15 MG tablet Commonly known as:  REMERON Take 15 mg by mouth at bedtime.   MULTIVITAMIN & MINERAL PO Take 1 tablet by mouth daily.   TRINTELLIX 10 MG Tabs Generic drug:  vortioxetine HBr Take 10 mg by mouth 2 (two) times daily.            Discharge Care Instructions        Start     Ordered   06/09/17 0000  cephALEXin (KEFLEX) 500 MG capsule  2 times daily     06/09/17 0759      Allergies  Allergen Reactions  . Zolpidem Tartrate Other (See Comments)    hallucinations  . Amoxicillin Rash    Consultations: ortho  Procedures/Studies: Mri Left Foot With And Without Contrast  Result Date: 06/07/2017 CLINICAL DATA:  Nonhealing wound on the lateral aspect of the left foot for approximately 2 weeks. EXAM: MRI OF THE LEFT FOREFOOT WITHOUT AND WITH CONTRAST TECHNIQUE: Multiplanar, multisequence MR imaging of the left forefoot was performed both before and after administration of intravenous contrast. CONTRAST:  10 ml MULTIHANCE GADOBENATE DIMEGLUMINE 529 MG/ML IV SOLN COMPARISON:  Plain films left foot 06/05/2017 and MRI left foot 03/31/2017. FINDINGS: Bones/Joint/Cartilage No bone marrow signal abnormality to suggest osteomyelitis is identified. 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 There is  also some midfoot degenerative change. No fracture. Ligaments Intact. Muscles and Tendons There is mild atrophy of intrinsic musculature the foot. No intramuscular fluid collection or focal lesion. Soft tissues Skin ulceration along the fifth MTP joint is seen. No underlying abscess. Subcutaneous edema over the dorsum of the foot is noted. No abscess. No joint effusion. IMPRESSION: Skin wound at the fifth MTP joint without underlying abscess, septic joint or osteomyelitis. Hallux valgus and first MTP osteoarthritis. Midfoot degenerative change also noted. Electronically Signed   By: Drusilla Kanner M.D.   On: 06/07/2017 00:35   Dg Foot 2 Views Left  Result Date: 06/05/2017 CLINICAL DATA:  Lateral foot ulcer for 10 days. EXAM: LEFT FOOT - 2 VIEW COMPARISON:  MRI 03/31/2017 FINDINGS: Advanced degenerative changes at the first MTP joint with a marked hallux valgus deformity. No acute bony findings. The other MTP joints are maintained. Suspect prior surgery involving the fifth metatarsal with a lateral osteotomy. There is marked soft tissue swelling over the lateral aspect of the fifth toe and fifth MTP joint but I do not see any definite destructive bony changes to suggest osteomyelitis. Mild pes cavus. Small calcaneal heel spur. IMPRESSION: Significant soft tissue swelling over the lateral aspect of the forefoot Suspect prior postoperative changes involving the fifth metatarsal head but no findings to suggest osteomyelitis. First MTP joint degenerative changes and marked hallux valgus deformity. Moderate midfoot degenerative changes. Electronically Signed   By: Rudie Meyer M.D.   On: 06/05/2017 20:50    (Echo, Carotid, EGD, Colonoscopy, ERCP)    Subjective:   Discharge Exam: Vitals:   06/08/17 2100 06/09/17 0442  BP: (!) 117/50 (!) 118/54  Pulse: 65 61  Resp:  18  Temp:  98.5 F (36.9 C)  SpO2:  98%   Vitals:   06/08/17 1211 06/08/17 2037 06/08/17 2100 06/09/17 0442  BP: (!) 123/59 (!)  111/51 (!) 117/50 (!) 118/54  Pulse: (!) 53 65 65 61  Resp: Temp: 98.6 F (37 C) 98.4 F (36.9 C)  98.5 F (36.9 C)  TempSrc: Oral Oral  Oral  SpO2: 97% 98%  98%  Weight:      Height:        General: Pt is alert, awake, not in acute distress Cardiovascular: RRR, S1/S2 +, no rubs, no gallops Respiratory: CTA bilaterally, no wheezing, no rhonchi Abdominal: Soft, NT, ND, bowel sounds + Extremities: no edema, no cyanosis    The results of significant diagnostics from this hospitalization (including imaging, microbiology, ancillary and laboratory) are listed below for reference.     Microbiology: Recent Results (from the past 240 hour(s))  Blood culture (routine x 2)     Status: None (Preliminary result)   Collection Time: 06/05/17  7:58 PM  Result Value Ref Range Status   Specimen Description BLOOD LEFT ANTECUBITAL  Final   Special Requests IN PEDIATRIC  BOTTLE Blood Culture adequate volume  Final   Culture NO GROWTH 3 DAYS  Final   Report Status PENDING  Incomplete  Blood culture (routine x 2)     Status: None (Preliminary result)   Collection Time: 06/05/17  8:03 PM  Result Value Ref Range Status   Specimen Description BLOOD LEFT ANTECUBITAL  Final   Special Requests IN PEDIATRIC BOTTLE Blood Culture adequate volume  Final   Culture NO GROWTH 3 DAYS  Final   Report Status PENDING  Incomplete  Aerobic Culture (superficial specimen)     Status: None (Preliminary result)   Collection Time: 06/05/17  8:32 PM  Result Value Ref Range Status   Specimen Description WOUND LEFT FOOT  Final   Special Requests NONE  Final   Gram Stain   Final    FEW WBC PRESENT, PREDOMINANTLY PMN MODERATE GRAM POSITIVE COCCI FEW GRAM POSITIVE RODS FEW GRAM NEGATIVE RODS    Culture   Final    MODERATE GROUP B STREP(S.AGALACTIAE)ISOLATED TESTING AGAINST S. AGALACTIAE NOT ROUTINELY PERFORMED DUE TO PREDICTABILITY OF AMP/PEN/VAN SUSCEPTIBILITY.    Report Status PENDING  Incomplete      Labs: BNP (last 3 results) No results for input(s): BNP in the last 8760 hours. Basic Metabolic Panel:  Recent Labs Lab 06/05/17 1956 06/06/17 0258 06/07/17 0433  NA 137 138 138  K 4.3 4.0 4.4  CL 110 111 114*  CO2 18* 20* 18*  GLUCOSE 93 131* 93  BUN 35* 32* 26*  CREATININE 1.55* 1.56* 1.38*  CALCIUM 9.3 8.9 8.4*  MG  --   --  1.8   Liver Function Tests: No results for input(s): AST, ALT, ALKPHOS, BILITOT, PROT, ALBUMIN in the last 168 hours. No results for input(s): LIPASE, AMYLASE in the last 168 hours. No results for input(s): AMMONIA in the last 168 hours. CBC:  Recent Labs Lab 06/05/17 1956 06/06/17 0258 06/07/17 0433  WBC 8.8 8.8 9.2  HGB 10.7* 10.2* 9.9*  HCT 35.5* 34.1* 33.3*  MCV 91.0 91.4 91.5  PLT 419* 400 377   Cardiac Enzymes: No results for input(s): CKTOTAL, CKMB, CKMBINDEX, TROPONINI in the last 168 hours. BNP: Invalid input(s): POCBNP CBG: No results for input(s): GLUCAP in the last 168 hours. D-Dimer No results for input(s): DDIMER in the last 72 hours. Hgb A1c No results for input(s): HGBA1C in the last 72 hours. Lipid Profile No results for input(s): CHOL, HDL, LDLCALC, TRIG, CHOLHDL, LDLDIRECT in the last 72 hours. Thyroid function studies No results for input(s): TSH, T4TOTAL, T3FREE, THYROIDAB in the last 72 hours.  Invalid input(s): FREET3 Anemia work up No results for input(s): VITAMINB12, FOLATE, FERRITIN, TIBC, IRON, RETICCTPCT in the last 72 hours. Urinalysis    Component Value Date/Time   COLORURINE YELLOW 06/06/2017 0758   APPEARANCEUR CLEAR 06/06/2017 0758   LABSPEC 1.013 06/06/2017 0758   PHURINE 5.0 06/06/2017 0758   GLUCOSEU NEGATIVE 06/06/2017 0758   HGBUR NEGATIVE 06/06/2017 0758   BILIRUBINUR NEGATIVE 06/06/2017 0758   BILIRUBINUR neg 04/21/2015 0956   KETONESUR NEGATIVE 06/06/2017 0758   PROTEINUR NEGATIVE 06/06/2017 0758   UROBILINOGEN 1.0 06/12/2015 1732   NITRITE NEGATIVE 06/06/2017 0758   LEUKOCYTESUR  SMALL (A) 06/06/2017 0758   Sepsis Labs Invalid input(s): PROCALCITONIN,  WBC,  LACTICIDVEN Microbiology Recent Results (from the past 240 hour(s))  Blood culture (routine x 2)     Status: None (Preliminary result)   Collection Time: 06/05/17  7:58 PM  Result Value Ref Range Status   Specimen  Description BLOOD LEFT ANTECUBITAL  Final   Special Requests IN PEDIATRIC BOTTLE Blood Culture adequate volume  Final   Culture NO GROWTH 3 DAYS  Final   Report Status PENDING  Incomplete  Blood culture (routine x 2)     Status: None (Preliminary result)   Collection Time: 06/05/17  8:03 PM  Result Value Ref Range Status   Specimen Description BLOOD LEFT ANTECUBITAL  Final   Special Requests IN PEDIATRIC BOTTLE Blood Culture adequate volume  Final   Culture NO GROWTH 3 DAYS  Final   Report Status PENDING  Incomplete  Aerobic Culture (superficial specimen)     Status: None (Preliminary result)   Collection Time: 06/05/17  8:32 PM  Result Value Ref Range Status   Specimen Description WOUND LEFT FOOT  Final   Special Requests NONE  Final   Gram Stain   Final    FEW WBC PRESENT, PREDOMINANTLY PMN MODERATE GRAM POSITIVE COCCI FEW GRAM POSITIVE RODS FEW GRAM NEGATIVE RODS    Culture   Final    MODERATE GROUP B STREP(S.AGALACTIAE)ISOLATED TESTING AGAINST S. AGALACTIAE NOT ROUTINELY PERFORMED DUE TO PREDICTABILITY OF AMP/PEN/VAN SUSCEPTIBILITY.    Report Status PENDING  Incomplete     Time coordinating discharge: Over 30 minutes  SIGNED:   Alwyn Ren, MD  [facility] 06/09/2017, 8:00 AM   If 7PM-7AM, please contact night-coverage [email or web address] Password [email or web address]

## 2017-06-10 LAB — CULTURE, BLOOD (ROUTINE X 2)
CULTURE: NO GROWTH
CULTURE: NO GROWTH
SPECIAL REQUESTS: ADEQUATE
Special Requests: ADEQUATE

## 2017-06-13 DIAGNOSIS — I129 Hypertensive chronic kidney disease with stage 1 through stage 4 chronic kidney disease, or unspecified chronic kidney disease: Secondary | ICD-10-CM | POA: Diagnosis not present

## 2017-06-13 DIAGNOSIS — E785 Hyperlipidemia, unspecified: Secondary | ICD-10-CM | POA: Diagnosis not present

## 2017-06-13 DIAGNOSIS — G629 Polyneuropathy, unspecified: Secondary | ICD-10-CM | POA: Diagnosis not present

## 2017-06-13 DIAGNOSIS — L03116 Cellulitis of left lower limb: Secondary | ICD-10-CM | POA: Diagnosis not present

## 2017-06-13 DIAGNOSIS — K219 Gastro-esophageal reflux disease without esophagitis: Secondary | ICD-10-CM | POA: Diagnosis not present

## 2017-06-13 DIAGNOSIS — L97422 Non-pressure chronic ulcer of left heel and midfoot with fat layer exposed: Secondary | ICD-10-CM | POA: Diagnosis not present

## 2017-06-13 DIAGNOSIS — F329 Major depressive disorder, single episode, unspecified: Secondary | ICD-10-CM | POA: Diagnosis not present

## 2017-06-13 DIAGNOSIS — G6289 Other specified polyneuropathies: Secondary | ICD-10-CM | POA: Diagnosis not present

## 2017-06-13 DIAGNOSIS — Z7901 Long term (current) use of anticoagulants: Secondary | ICD-10-CM | POA: Diagnosis not present

## 2017-06-13 DIAGNOSIS — G4733 Obstructive sleep apnea (adult) (pediatric): Secondary | ICD-10-CM | POA: Diagnosis not present

## 2017-06-13 DIAGNOSIS — I4891 Unspecified atrial fibrillation: Secondary | ICD-10-CM | POA: Diagnosis not present

## 2017-06-13 DIAGNOSIS — F419 Anxiety disorder, unspecified: Secondary | ICD-10-CM | POA: Diagnosis not present

## 2017-06-13 DIAGNOSIS — Z87891 Personal history of nicotine dependence: Secondary | ICD-10-CM | POA: Diagnosis not present

## 2017-06-13 DIAGNOSIS — N183 Chronic kidney disease, stage 3 (moderate): Secondary | ICD-10-CM | POA: Diagnosis not present

## 2017-06-13 DIAGNOSIS — M170 Bilateral primary osteoarthritis of knee: Secondary | ICD-10-CM | POA: Diagnosis not present

## 2017-06-16 DIAGNOSIS — G4733 Obstructive sleep apnea (adult) (pediatric): Secondary | ICD-10-CM | POA: Diagnosis not present

## 2017-06-16 DIAGNOSIS — Z7901 Long term (current) use of anticoagulants: Secondary | ICD-10-CM | POA: Diagnosis not present

## 2017-06-16 DIAGNOSIS — I4891 Unspecified atrial fibrillation: Secondary | ICD-10-CM | POA: Diagnosis not present

## 2017-06-16 DIAGNOSIS — E785 Hyperlipidemia, unspecified: Secondary | ICD-10-CM | POA: Diagnosis not present

## 2017-06-16 DIAGNOSIS — F419 Anxiety disorder, unspecified: Secondary | ICD-10-CM | POA: Diagnosis not present

## 2017-06-16 DIAGNOSIS — L03116 Cellulitis of left lower limb: Secondary | ICD-10-CM | POA: Diagnosis not present

## 2017-06-16 DIAGNOSIS — I129 Hypertensive chronic kidney disease with stage 1 through stage 4 chronic kidney disease, or unspecified chronic kidney disease: Secondary | ICD-10-CM | POA: Diagnosis not present

## 2017-06-16 DIAGNOSIS — K219 Gastro-esophageal reflux disease without esophagitis: Secondary | ICD-10-CM | POA: Diagnosis not present

## 2017-06-16 DIAGNOSIS — M170 Bilateral primary osteoarthritis of knee: Secondary | ICD-10-CM | POA: Diagnosis not present

## 2017-06-16 DIAGNOSIS — G629 Polyneuropathy, unspecified: Secondary | ICD-10-CM | POA: Diagnosis not present

## 2017-06-16 DIAGNOSIS — F329 Major depressive disorder, single episode, unspecified: Secondary | ICD-10-CM | POA: Diagnosis not present

## 2017-06-16 DIAGNOSIS — L97422 Non-pressure chronic ulcer of left heel and midfoot with fat layer exposed: Secondary | ICD-10-CM | POA: Diagnosis not present

## 2017-06-16 DIAGNOSIS — N183 Chronic kidney disease, stage 3 (moderate): Secondary | ICD-10-CM | POA: Diagnosis not present

## 2017-06-16 DIAGNOSIS — Z87891 Personal history of nicotine dependence: Secondary | ICD-10-CM | POA: Diagnosis not present

## 2017-06-19 DIAGNOSIS — I480 Paroxysmal atrial fibrillation: Secondary | ICD-10-CM | POA: Diagnosis not present

## 2017-06-19 DIAGNOSIS — L03116 Cellulitis of left lower limb: Secondary | ICD-10-CM | POA: Diagnosis not present

## 2017-06-21 DIAGNOSIS — L03116 Cellulitis of left lower limb: Secondary | ICD-10-CM | POA: Diagnosis not present

## 2017-06-21 DIAGNOSIS — F419 Anxiety disorder, unspecified: Secondary | ICD-10-CM | POA: Diagnosis not present

## 2017-06-21 DIAGNOSIS — G4733 Obstructive sleep apnea (adult) (pediatric): Secondary | ICD-10-CM | POA: Diagnosis not present

## 2017-06-21 DIAGNOSIS — L97422 Non-pressure chronic ulcer of left heel and midfoot with fat layer exposed: Secondary | ICD-10-CM | POA: Diagnosis not present

## 2017-06-21 DIAGNOSIS — Z7901 Long term (current) use of anticoagulants: Secondary | ICD-10-CM | POA: Diagnosis not present

## 2017-06-21 DIAGNOSIS — F329 Major depressive disorder, single episode, unspecified: Secondary | ICD-10-CM | POA: Diagnosis not present

## 2017-06-21 DIAGNOSIS — G629 Polyneuropathy, unspecified: Secondary | ICD-10-CM | POA: Diagnosis not present

## 2017-06-24 ENCOUNTER — Other Ambulatory Visit: Payer: Self-pay | Admitting: Physician Assistant

## 2017-06-26 DIAGNOSIS — Z7901 Long term (current) use of anticoagulants: Secondary | ICD-10-CM | POA: Diagnosis not present

## 2017-06-26 DIAGNOSIS — F329 Major depressive disorder, single episode, unspecified: Secondary | ICD-10-CM | POA: Diagnosis not present

## 2017-06-26 DIAGNOSIS — L97422 Non-pressure chronic ulcer of left heel and midfoot with fat layer exposed: Secondary | ICD-10-CM | POA: Diagnosis not present

## 2017-06-26 DIAGNOSIS — G629 Polyneuropathy, unspecified: Secondary | ICD-10-CM | POA: Diagnosis not present

## 2017-06-26 DIAGNOSIS — L03116 Cellulitis of left lower limb: Secondary | ICD-10-CM | POA: Diagnosis not present

## 2017-06-26 DIAGNOSIS — G4733 Obstructive sleep apnea (adult) (pediatric): Secondary | ICD-10-CM | POA: Diagnosis not present

## 2017-06-26 DIAGNOSIS — F419 Anxiety disorder, unspecified: Secondary | ICD-10-CM | POA: Diagnosis not present

## 2017-06-26 NOTE — Progress Notes (Signed)
CARDIOLOGY OFFICE NOTE  Date:  06/27/2017    [name] Date of Birth: 1951/08/04 Medical Record #[id number]  PCP:  Jarome Matin, MD  Cardiologist:  Tyrone Sage & Skains/Taylor   Chief Complaint  Patient presents with  . Atrial Fibrillation  . Hypertension    Post hospital visit - seen for Dr. Rosine Abe    History of Present Illness: [name] is a 66 y.o. female who presents today for a work in visit - post hospital check. Seen for Dr. Anne Fu.   She has had atrial fibrillation in the setting of HTN heart disease and morbid obesity. She has had symptomatic atrial fib for over a year which has been paroxysmal at times and persistent at times. She has had RVR and a slow VR and with medical therapy has also had sinus bradycardia. She has never had syncope. Other issues include HTN, HLD, & venous insufficiency and is prone to developing ulcers on her legs.  Referred to EP and saw Dr. Ladona Ridgel back in May of 2018 - Tikosyn was initiated. She had to stop her Asenapine, HCTZ and her Verapamil.   Several phone calls back over the summer noting elevated BP. Hydralazine was increased. Imdur started. Coreg also increased. She ended up in the ER with a migraine and elevated BP. I then saw her and stopped the Imdur and added nighttime Norvasc.    Last seen by me back in July - Hydralazine was increased for elevated BP and she was noted to be having lots of anxieties. Dr. Ladona Ridgel thought it would be ok to go back on Clonazepam.   Admitted last month with cellulitis. No evidence of osteo. Treated with antibiotics.   Comes in today. Here alone. Here because she missed her regular visit with Dr. Ladona Ridgel because of being in the hospital. Her cat has now died. She is planning on fostering some cats.  BP has improved. Was 142/64 earlier this morning when home health came. Her foot is improving. She notes some intermittent dizziness - happened getting out of the car here today.  She has home health coming about once a week. No chest pain. Breathing is ok.   Past Medical History:  Diagnosis Date  . Anxiety   . 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  PT ALSO HAS LOWER BACK PAIN AT TIMES  . Depression   . GERD (gastroesophageal reflux disease)    RARE- OTC ANTACID IF NEEDED--PAST HX OF ULCER  . Headache    wakes up with headache   . Hyperlipidemia   . Hypertension   . Kidney stones    PT PASSED STONES--NO KNOWN STONES AT PRESENT TIMES  . Lumbago 10/02/2013  . Obesity   . OSA (obstructive sleep apnea) 12/30/2013   Very mild OSA with REM accentuation,  tested 11-29-13 at [facility], Dr Frances Furbish .   Marland Kitchen Periodic limb movement disorder (PLMD) 12/30/2013  . Restless leg syndrome   . Ulcer   . Urinary incontinence     Past Surgical History:  Procedure Laterality Date  . BILATERAL BUNIONECTOMY 1979    . BREAST SURGERY     biopsy   . COLON SURGERY    . SMALL INTESTINE SURGERY    . SURGERY FOR BOWEL OBSTRUCTION AND REMOVAL OF PART OF STOMACH  IN 2000  2000  . TOTAL KNEE ARTHROPLASTY  02/16/2012   Procedure: TOTAL KNEE ARTHROPLASTY;  Surgeon: Javier Docker, MD;  Location: [facility];  Service: Orthopedics;  Laterality: Left;  . TOTAL KNEE ARTHROPLASTY Right 11/20/2014   Procedure: RIGHT TOTAL KNEE ARTHROPLASTY;  Surgeon: Javier Docker, MD;  Location: [facility];  Service: Orthopedics;  Laterality: Right;     Medications: Current Meds  Medication Sig  . acetaminophen (TYLENOL) 325 MG tablet Take 650 mg by mouth every 6 (six) hours as needed for mild pain.  Marland Kitchen amLODipine (NORVASC) 10 MG tablet Take 1 tablet (10 mg total) by mouth daily.  Marland Kitchen buPROPion (WELLBUTRIN XL) 150 MG 24 hr tablet Take 150 mg by mouth every morning.  . carvedilol (COREG) 12.5 MG tablet Take 1 tablet (12.5 mg total) by mouth 2 (two) times daily with a meal.  . cephALEXin (KEFLEX) 500 MG capsule Take 500 mg by mouth 2 (two) times daily.  Marland Kitchen ELIQUIS 5 MG TABS tablet Take 5 mg by mouth 2 (two)  times daily.  Marland Kitchen gabapentin (NEURONTIN) 600 MG tablet Take 600 mg by mouth 4 (four) times daily.  . hydrALAZINE (APRESOLINE) 50 MG tablet Take 1.5 tablets (75 mg total) by mouth 3 (three) times daily.  . iron polysaccharides (NIFEREX) 150 MG capsule Take 150 mg by mouth daily.  . meclizine (ANTIVERT) 25 MG tablet Take 25 mg by mouth 3 (three) times daily as needed for dizziness.  . mirtazapine (REMERON) 15 MG tablet Take 15 mg by mouth at bedtime.  . Multiple Vitamins-Minerals (MULTIVITAMIN & MINERAL PO) Take 1 tablet by mouth daily.  . [DISCONTINUED] dofetilide (TIKOSYN) 500 MCG capsule Take 1 capsule (500 mcg total) by mouth 2 (two) times daily.     Allergies: Allergies  Allergen Reactions  . Zolpidem Tartrate Other (See Comments)    hallucinations  . Amoxicillin Rash    Social History: The patient  reports that she quit smoking about 28 years ago. Her smoking use included Cigarettes. She has a 40.00 pack-year smoking history. She has never used smokeless tobacco. She reports that she does not drink alcohol or use drugs.   Family History: The patient's family history includes Multiple sclerosis in her sister and sister; Sleep apnea in her father.   Review of Systems: Please see the history of present illness.   Otherwise, the review of systems is positive for none.   All other systems are reviewed and negative.   Physical Exam: VS:  BP (!) 102/56 (BP Location: Left Arm, Patient Position: Sitting, Cuff Size: Large)   Pulse (!) 49   Ht  (1.626 m)   Wt 263 lb 12.8 oz (119.7 kg)   LMP  (LMP Unknown)   BMI 45.28 kg/m  .  BMI Body mass index is 45.28 kg/m.  Wt Readings from Last 3 Encounters:  06/27/17 263 lb 12.8 oz (119.7 kg)  06/05/17 260 lb (117.9 kg)  04/03/17 261 lb 12.8 oz (118.8 kg)    General: Pleasant. Obese female who is alert and in no acute distress.   HEENT: Normal.  Neck: Supple, no JVD, carotid bruits, or masses noted.  Cardiac: Regular rate and rhythm.  Heart tones are distant. No murmurs, rubs, or gallops. Trace edema.  Respiratory:  Lungs are clear to auscultation bilaterally with normal work of breathing.  GI: Soft and nontender.  MS: No deformity or atrophy. Gait and ROM intact. Using a cane.  Skin: Warm and dry. Color is normal.  Neuro:  Strength and sensation are intact and no gross focal deficits noted.  Psych: Alert, appropriate and with normal affect.   LABORATORY DATA:  EKG:  EKG is ordered today. This demonstrates sinus brady with prolonged QT - reviewed with Dr. Ladona Ridgel here in the office this morning.  Lab Results  Component Value Date   WBC 9.2 06/07/2017   HGB 9.9 (L) 06/07/2017   HCT 33.3 (L) 06/07/2017   PLT 377 06/07/2017   GLUCOSE 93 06/07/2017   ALT 14 03/22/2017   AST 20 03/22/2017   NA 138 06/07/2017   K 4.4 06/07/2017   CL 114 (H) 06/07/2017   CREATININE 1.38 (H) 06/07/2017   BUN 26 (H) 06/07/2017   CO2 18 (L) 06/07/2017   TSH 1.367 11/24/2014   INR 1.11 03/22/2017   HGBA1C 5.6 06/06/2017     BNP (last 3 results) No results for input(s): BNP in the last 8760 hours.  ProBNP (last 3 results) No results for input(s): PROBNP in the last 8760 hours.   Other Studies Reviewed Today:  Myoview Study Highlights 2016   Nuclear stress EF: 60%.  There was no ST segment deviation noted during stress.  Defect 1: There is a small defect of mild severity present in the mid anterior and apical anterior location. This is likely breast attenuation, but cannot rule out infarct with mild peri-infarct ischemia.  The study is normal.  This is a low risk study.  The left ventricular ejection fraction is normal (55-65%).    Echo Study Conclusions 2016  - Left ventricle: The cavity size was normal. Systolic function was normal. The estimated ejection fraction was in the range of 50% to 55%. Wall motion was normal; there were no regional wall motion abnormalities. - Mitral valve: There was mild  regurgitation. - Left atrium: The atrium was mildly dilated. - Right atrium: The atrium was mildly dilated.  Assessment/Plan:  1. Prolonged QT on EKG noted today - reviewed with Dr. Ladona Ridgel. Needs labs updated. Cutting dose of Tikosyn back to 250 mcg BID. EKG in 2 weeks and then see him in 6 to 8 weeks.   2. HTN - considerable improvement.   2. PAF - now on Tikosyn - remains in NSR - see #1.   3. Chronic anticoagulation - no problems noted. Little anemic - recheck CBC today.   4. Morbid obesity  5. Chronic venous insufficiency  6. Anxiety - chronic.    Current medicines are reviewed with the patient today.  The patient does not have concerns regarding medicines other than what has been noted above.  The following changes have been made:  See above.  Labs/ tests ordered today include:    Orders Placed This Encounter  Procedures  . Basic metabolic panel  . Magnesium  . CBC  . EKG 12-Lead     Disposition:   FU as noted above.   Patient is agreeable to this plan and will call if any problems develop in the interim.   SignedNorma Fredrickson, NP  06/27/2017 12:28 PM  [facility] [address] [place], [place]  [address] Phone: [phone] Fax: [phone]

## 2017-06-27 ENCOUNTER — Encounter (INDEPENDENT_AMBULATORY_CARE_PROVIDER_SITE_OTHER): Payer: Self-pay

## 2017-06-27 ENCOUNTER — Encounter: Payer: Self-pay | Admitting: Nurse Practitioner

## 2017-06-27 ENCOUNTER — Ambulatory Visit (INDEPENDENT_AMBULATORY_CARE_PROVIDER_SITE_OTHER): Payer: PPO | Admitting: Nurse Practitioner

## 2017-06-27 VITALS — BP 102/56 | HR 49 | Ht 64.0 in | Wt 263.8 lb

## 2017-06-27 DIAGNOSIS — Z79899 Other long term (current) drug therapy: Secondary | ICD-10-CM | POA: Diagnosis not present

## 2017-06-27 DIAGNOSIS — R9431 Abnormal electrocardiogram [ECG] [EKG]: Secondary | ICD-10-CM

## 2017-06-27 DIAGNOSIS — G4733 Obstructive sleep apnea (adult) (pediatric): Secondary | ICD-10-CM | POA: Diagnosis not present

## 2017-06-27 DIAGNOSIS — L03116 Cellulitis of left lower limb: Secondary | ICD-10-CM | POA: Diagnosis not present

## 2017-06-27 DIAGNOSIS — L97422 Non-pressure chronic ulcer of left heel and midfoot with fat layer exposed: Secondary | ICD-10-CM | POA: Diagnosis not present

## 2017-06-27 DIAGNOSIS — I480 Paroxysmal atrial fibrillation: Secondary | ICD-10-CM

## 2017-06-27 DIAGNOSIS — Z7901 Long term (current) use of anticoagulants: Secondary | ICD-10-CM | POA: Diagnosis not present

## 2017-06-27 DIAGNOSIS — G629 Polyneuropathy, unspecified: Secondary | ICD-10-CM | POA: Diagnosis not present

## 2017-06-27 DIAGNOSIS — N183 Chronic kidney disease, stage 3 (moderate): Secondary | ICD-10-CM | POA: Diagnosis not present

## 2017-06-27 DIAGNOSIS — F419 Anxiety disorder, unspecified: Secondary | ICD-10-CM | POA: Diagnosis not present

## 2017-06-27 DIAGNOSIS — E785 Hyperlipidemia, unspecified: Secondary | ICD-10-CM | POA: Diagnosis not present

## 2017-06-27 DIAGNOSIS — Z87891 Personal history of nicotine dependence: Secondary | ICD-10-CM | POA: Diagnosis not present

## 2017-06-27 DIAGNOSIS — F329 Major depressive disorder, single episode, unspecified: Secondary | ICD-10-CM | POA: Diagnosis not present

## 2017-06-27 DIAGNOSIS — I129 Hypertensive chronic kidney disease with stage 1 through stage 4 chronic kidney disease, or unspecified chronic kidney disease: Secondary | ICD-10-CM | POA: Diagnosis not present

## 2017-06-27 DIAGNOSIS — M170 Bilateral primary osteoarthritis of knee: Secondary | ICD-10-CM | POA: Diagnosis not present

## 2017-06-27 DIAGNOSIS — I4891 Unspecified atrial fibrillation: Secondary | ICD-10-CM | POA: Diagnosis not present

## 2017-06-27 DIAGNOSIS — K219 Gastro-esophageal reflux disease without esophagitis: Secondary | ICD-10-CM | POA: Diagnosis not present

## 2017-06-27 MED ORDER — DOFETILIDE 250 MCG PO CAPS: 250.0000 ug | ORAL_CAPSULE | Freq: Two times a day (BID) | ORAL | 6 refills | Status: DC

## 2017-06-27 NOTE — Patient Instructions (Addendum)
We will be checking the following labs today - BMET, CBC, Mag level   Medication Instructions:    Continue with your current medicines. BUT  Cutting the Tikosyn back to 250 mcg to take twice a day - this has been sent to your pharmacy    Testing/Procedures To Be Arranged:  N/A  Follow-Up:   See Dr. Ladona Ridgel in 6 to 8 weeks  EKG in 2 weeks per Dr. Ladona Ridgel    Other Special Instructions:   N/A    If you need a refill on your cardiac medications before your next appointment, please call your pharmacy.   Call the [facility] office at [phone] if you have any questions, problems or concerns.

## 2017-06-27 NOTE — Telephone Encounter (Signed)
It is on the list but it is showing up as expired.  Pt should continue taking it.

## 2017-06-27 NOTE — Telephone Encounter (Signed)
Per snapshot last ordered under Dr Ladona Ridgel, but it was on class no print. Patient was seen in the office today and it is not listed on the med list on the office note. I do not see where patient was taken off of the medication. Please advise. Thanks, MI

## 2017-06-27 NOTE — Telephone Encounter (Signed)
I think it must have been missed on her appt today.  She recently saw our pharmacy for hypertension management and they noted it to be continued.  Also wasn't discontinued during last hospital stay.  She needs to continue on it. Thank you,  Boneta Lucks

## 2017-06-28 DIAGNOSIS — G4733 Obstructive sleep apnea (adult) (pediatric): Secondary | ICD-10-CM | POA: Diagnosis not present

## 2017-06-28 DIAGNOSIS — F419 Anxiety disorder, unspecified: Secondary | ICD-10-CM | POA: Diagnosis not present

## 2017-06-28 DIAGNOSIS — F329 Major depressive disorder, single episode, unspecified: Secondary | ICD-10-CM | POA: Diagnosis not present

## 2017-06-28 DIAGNOSIS — G629 Polyneuropathy, unspecified: Secondary | ICD-10-CM | POA: Diagnosis not present

## 2017-06-28 DIAGNOSIS — Z7901 Long term (current) use of anticoagulants: Secondary | ICD-10-CM | POA: Diagnosis not present

## 2017-06-28 DIAGNOSIS — L97422 Non-pressure chronic ulcer of left heel and midfoot with fat layer exposed: Secondary | ICD-10-CM | POA: Diagnosis not present

## 2017-06-28 DIAGNOSIS — L03116 Cellulitis of left lower limb: Secondary | ICD-10-CM | POA: Diagnosis not present

## 2017-06-28 LAB — MAGNESIUM: Magnesium: 2 mg/dL (ref 1.6–2.3)

## 2017-06-28 LAB — CBC
Hematocrit: 34.9 % (ref 34.0–46.6)
Hemoglobin: 10.9 g/dL — ABNORMAL LOW (ref 11.1–15.9)
MCH: 27.7 pg (ref 26.6–33.0)
MCHC: 31.2 g/dL — ABNORMAL LOW (ref 31.5–35.7)
MCV: 89 fL (ref 79–97)
Platelets: 429 10*3/uL — ABNORMAL HIGH (ref 150–379)
RBC: 3.94 x10E6/uL (ref 3.77–5.28)
RDW: 15.9 % — ABNORMAL HIGH (ref 12.3–15.4)
WBC: 8.8 10*3/uL (ref 3.4–10.8)

## 2017-06-28 LAB — BASIC METABOLIC PANEL
BUN/Creatinine Ratio: 18 (ref 12–28)
BUN: 21 mg/dL (ref 8–27)
CO2: 23 mmol/L (ref 20–29)
Calcium: 9.5 mg/dL (ref 8.7–10.3)
Chloride: 107 mmol/L — ABNORMAL HIGH (ref 96–106)
Creatinine, Ser: 1.19 mg/dL — ABNORMAL HIGH (ref 0.57–1.00)
GFR calc Af Amer: 55 mL/min/{1.73_m2} — ABNORMAL LOW (ref >59)
GFR calc non Af Amer: 48 mL/min/{1.73_m2} — ABNORMAL LOW (ref >59)
Glucose: 98 mg/dL (ref 65–99)
Potassium: 4.3 mmol/L (ref 3.5–5.2)
Sodium: 143 mmol/L (ref 134–144)

## 2017-06-29 DIAGNOSIS — Z7901 Long term (current) use of anticoagulants: Secondary | ICD-10-CM | POA: Diagnosis not present

## 2017-06-29 DIAGNOSIS — F329 Major depressive disorder, single episode, unspecified: Secondary | ICD-10-CM | POA: Diagnosis not present

## 2017-06-29 DIAGNOSIS — G629 Polyneuropathy, unspecified: Secondary | ICD-10-CM | POA: Diagnosis not present

## 2017-06-29 DIAGNOSIS — G4733 Obstructive sleep apnea (adult) (pediatric): Secondary | ICD-10-CM | POA: Diagnosis not present

## 2017-06-29 DIAGNOSIS — L97422 Non-pressure chronic ulcer of left heel and midfoot with fat layer exposed: Secondary | ICD-10-CM | POA: Diagnosis not present

## 2017-06-29 DIAGNOSIS — F419 Anxiety disorder, unspecified: Secondary | ICD-10-CM | POA: Diagnosis not present

## 2017-06-29 DIAGNOSIS — L03116 Cellulitis of left lower limb: Secondary | ICD-10-CM | POA: Diagnosis not present

## 2017-07-03 ENCOUNTER — Telehealth: Payer: Self-pay | Admitting: Internal Medicine

## 2017-07-03 NOTE — Telephone Encounter (Signed)
Spoke with Lindsay-moved Pt to 07/12/2017 @ 1100 per Pt request. Call placed Pt-notified of change of appt date.  Pt indicates understanding.  No further action required.

## 2017-07-03 NOTE — Telephone Encounter (Signed)
New message  Pt call to reschedule appt with Nurse on 10/16. Pt states she would like to change it to 10/15 or 10/17 if possible. Please call back to discuss

## 2017-07-05 DIAGNOSIS — L03116 Cellulitis of left lower limb: Secondary | ICD-10-CM | POA: Diagnosis not present

## 2017-07-06 DIAGNOSIS — L97523 Non-pressure chronic ulcer of other part of left foot with necrosis of muscle: Secondary | ICD-10-CM | POA: Diagnosis not present

## 2017-07-08 DIAGNOSIS — Z6841 Body Mass Index (BMI) 40.0 and over, adult: Secondary | ICD-10-CM | POA: Diagnosis not present

## 2017-07-08 DIAGNOSIS — L03116 Cellulitis of left lower limb: Secondary | ICD-10-CM | POA: Diagnosis not present

## 2017-07-08 DIAGNOSIS — I7389 Other specified peripheral vascular diseases: Secondary | ICD-10-CM | POA: Diagnosis not present

## 2017-07-08 DIAGNOSIS — I480 Paroxysmal atrial fibrillation: Secondary | ICD-10-CM | POA: Diagnosis not present

## 2017-07-08 DIAGNOSIS — L97529 Non-pressure chronic ulcer of other part of left foot with unspecified severity: Secondary | ICD-10-CM | POA: Diagnosis not present

## 2017-07-08 DIAGNOSIS — G608 Other hereditary and idiopathic neuropathies: Secondary | ICD-10-CM | POA: Diagnosis not present

## 2017-07-11 ENCOUNTER — Ambulatory Visit: Payer: PPO

## 2017-07-11 DIAGNOSIS — I480 Paroxysmal atrial fibrillation: Secondary | ICD-10-CM | POA: Diagnosis not present

## 2017-07-11 DIAGNOSIS — Z23 Encounter for immunization: Secondary | ICD-10-CM | POA: Diagnosis not present

## 2017-07-11 DIAGNOSIS — G608 Other hereditary and idiopathic neuropathies: Secondary | ICD-10-CM | POA: Diagnosis not present

## 2017-07-11 DIAGNOSIS — L97529 Non-pressure chronic ulcer of other part of left foot with unspecified severity: Secondary | ICD-10-CM | POA: Diagnosis not present

## 2017-07-11 DIAGNOSIS — I10 Essential (primary) hypertension: Secondary | ICD-10-CM | POA: Diagnosis not present

## 2017-07-11 DIAGNOSIS — Z6841 Body Mass Index (BMI) 40.0 and over, adult: Secondary | ICD-10-CM | POA: Diagnosis not present

## 2017-07-11 DIAGNOSIS — F329 Major depressive disorder, single episode, unspecified: Secondary | ICD-10-CM | POA: Diagnosis not present

## 2017-07-12 ENCOUNTER — Ambulatory Visit (INDEPENDENT_AMBULATORY_CARE_PROVIDER_SITE_OTHER): Payer: PPO | Admitting: Interventional Cardiology

## 2017-07-12 VITALS — BP 99/61 | HR 50 | Ht 64.0 in | Wt 262.1 lb

## 2017-07-12 DIAGNOSIS — I480 Paroxysmal atrial fibrillation: Secondary | ICD-10-CM

## 2017-07-12 MED ORDER — CARVEDILOL 6.25 MG PO TABS: 6.2500 mg | ORAL_TABLET | Freq: Two times a day (BID) | ORAL | 3 refills | Status: DC

## 2017-07-12 NOTE — Progress Notes (Signed)
Pt seen on 10/3, by Norma FredricksonLori Gerhardt NP,  [facility] During visit EKG showed prolonged QT and Tikosyn was decreased to 250 mcg BID. Pt here today for f/u EKG after decreasing TIkosyn.  Pt report SOB/Dizziness past 2 weeks.  Her BP normal-increased before morning medications.  After she takes morning medications her BP drops to 90s/50s & HRs in the 50s. EKG performed today showing improved QT -- reviewed by Dr. Eldridge DaceVaranasi. Order to decrease Carvedilol to 6.25 mg to see if this helps bring BP back up. Pt has f/u scheduled next week w/ Dr. Anne FuSkains and will see how BP are maintaining then after decreased Coreg. Patient verbalized understanding and agreeable to plan.

## 2017-07-12 NOTE — Patient Instructions (Signed)
Medication Instructions:  Your physician has recommended you make the following change in your medication:  1. DECREASE CARVEDILOL to 6.25 mg twice daily  Labwork: None ordered  Testing/Procedures: None ordered  Follow-Up: Keep your currently scheduled follow up  -- If you need a refill on your cardiac medications before your next appointment, please call your pharmacy. --  Thank you for choosing [facility]!!   Dory HornSherri Brian Kocourek, RN [phone]  Any Other Special Instructions Will Be Listed Below (If Applicable). If blood pressure remains low, please call the office back to address.

## 2017-07-16 ENCOUNTER — Other Ambulatory Visit: Payer: Self-pay | Admitting: Cardiology

## 2017-07-17 ENCOUNTER — Encounter: Payer: Self-pay | Admitting: Cardiology

## 2017-07-17 ENCOUNTER — Ambulatory Visit (INDEPENDENT_AMBULATORY_CARE_PROVIDER_SITE_OTHER): Payer: PPO | Admitting: Cardiology

## 2017-07-17 ENCOUNTER — Other Ambulatory Visit (INDEPENDENT_AMBULATORY_CARE_PROVIDER_SITE_OTHER): Payer: PPO

## 2017-07-17 VITALS — BP 138/82 | HR 64 | Ht 64.75 in | Wt 262.0 lb

## 2017-07-17 DIAGNOSIS — I480 Paroxysmal atrial fibrillation: Secondary | ICD-10-CM | POA: Diagnosis not present

## 2017-07-17 DIAGNOSIS — L03116 Cellulitis of left lower limb: Secondary | ICD-10-CM | POA: Diagnosis not present

## 2017-07-17 DIAGNOSIS — L97422 Non-pressure chronic ulcer of left heel and midfoot with fat layer exposed: Secondary | ICD-10-CM | POA: Diagnosis not present

## 2017-07-17 DIAGNOSIS — Z79899 Other long term (current) drug therapy: Secondary | ICD-10-CM | POA: Diagnosis not present

## 2017-07-17 DIAGNOSIS — Z7901 Long term (current) use of anticoagulants: Secondary | ICD-10-CM | POA: Diagnosis not present

## 2017-07-17 DIAGNOSIS — G4733 Obstructive sleep apnea (adult) (pediatric): Secondary | ICD-10-CM | POA: Diagnosis not present

## 2017-07-17 DIAGNOSIS — R9431 Abnormal electrocardiogram [ECG] [EKG]: Secondary | ICD-10-CM

## 2017-07-17 DIAGNOSIS — F419 Anxiety disorder, unspecified: Secondary | ICD-10-CM | POA: Diagnosis not present

## 2017-07-17 DIAGNOSIS — G629 Polyneuropathy, unspecified: Secondary | ICD-10-CM | POA: Diagnosis not present

## 2017-07-17 DIAGNOSIS — F329 Major depressive disorder, single episode, unspecified: Secondary | ICD-10-CM | POA: Diagnosis not present

## 2017-07-17 NOTE — Patient Instructions (Signed)
Medication Instructions:  The current medical regimen is effective;  continue present plan and medications.  Follow-Up: Follow up in 4 months with Lori Gerhardt, NP.  If you need a refill on your cardiac medications before your next appointment, please call your pharmacy.  Thank you for choosing [facility]!!     

## 2017-07-17 NOTE — Progress Notes (Signed)
Cardiology Office Note   Date:  07/17/2017   ID:  [name], DOB 1950-11-07, MRN [id number]  PCP:  Jarome Matin, MD  Cardiologist:   Donato Schultz, MD       History of Present Illness: [name] is a 66 y.o. female who presents for follow up of  atrial fibrillation and dyspnea. 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  Dr. Ladona Ridgel saw her and initiated Tikosyn in June of 2018.  She was then seen on 07/12/17 and her dofetilide was decreased to 250 mcg twice daily.  She had been reporting shortness of breath and dizziness for 2 weeks.  Her blood pressure in the morning was 90/50 with heart rates in the 50s.  QT interval was improved.  He carvedilol was decreased to 6.25 mg to see if this helps with blood pressure.  Today, her blood pressures were much better controlled.   She stopped metoprolol previously because of hair loss.  At one point she received an EKG and because of her dyspnea, he recommended heart catheterization. She wished to ask for a second opinion/referral to me to discuss. A nuclear stress test was performed at that time in 2016 and was low risk.  She then was hospitalized in September 2018 with wound infection.  She has remained in sinus rhythm after being on Tijkosyn.  Dose was cut back to 250 mcg twice daily.  Past Medical History:  Diagnosis Date  . Anxiety   . 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  PT ALSO HAS LOWER BACK PAIN AT TIMES  . Depression   . GERD (gastroesophageal reflux disease)    RARE- OTC ANTACID IF NEEDED--PAST HX OF ULCER  . Headache    wakes up with headache   . Hyperlipidemia   . Hypertension   . Kidney stones    PT PASSED STONES--NO KNOWN STONES AT PRESENT TIMES  . Lumbago 10/02/2013  . Obesity   . OSA (obstructive sleep apnea) 12/30/2013   Very mild OSA with REM accentuation,  tested 11-29-13 at [facility] sleep, Dr Frances Furbish .   Marland Kitchen Periodic limb movement  disorder (PLMD) 12/30/2013  . Restless leg syndrome   . Ulcer   . Urinary incontinence     Past Surgical History:  Procedure Laterality Date  . BILATERAL BUNIONECTOMY 1979    . BREAST SURGERY     biopsy   . COLON SURGERY    . SMALL INTESTINE SURGERY    . SURGERY FOR BOWEL OBSTRUCTION AND REMOVAL OF PART OF STOMACH  IN 2000  2000  . TOTAL KNEE ARTHROPLASTY  02/16/2012   Procedure: TOTAL KNEE ARTHROPLASTY;  Surgeon: Javier Docker, MD;  Location: [facility];  Service: Orthopedics;  Laterality: Left;  . TOTAL KNEE ARTHROPLASTY Right 11/20/2014   Procedure: RIGHT TOTAL KNEE ARTHROPLASTY;  Surgeon: Javier Docker, MD;  Location: [facility];  Service: Orthopedics;  Laterality: Right;     Current Outpatient Prescriptions  Medication Sig Dispense Refill  . acetaminophen (TYLENOL) 325 MG tablet Take 650 mg by mouth every 6 (six) hours as needed for mild pain.    Marland Kitchen amLODipine (NORVASC) 10 MG tablet TAKE 1 TABLET(10 MG) BY MOUTH DAILY 30 tablet 11  . buPROPion (WELLBUTRIN XL) 150 MG 24 hr tablet Take 150 mg by mouth every morning.    . carvedilol (COREG) 6.25 MG tablet Take 1 tablet (6.25 mg total) by mouth 2 (  two) times daily. 180 tablet 3  . dofetilide (TIKOSYN) 250 MCG capsule Take 1 capsule (250 mcg total) by mouth 2 (two) times daily. 60 capsule 6  . ELIQUIS 5 MG TABS tablet Take 5 mg by mouth 2 (two) times daily.    Marland Kitchen. gabapentin (NEURONTIN) 600 MG tablet Take 600 mg by mouth 4 (four) times daily.    . hydrALAZINE (APRESOLINE) 50 MG tablet Take 75 mg by mouth 3 (three) times daily.    . iron polysaccharides (NIFEREX) 150 MG capsule Take 150 mg by mouth daily.    Marland Kitchen. lisinopril (PRINIVIL,ZESTRIL) 40 MG tablet Take 40 mg by mouth daily.    . meclizine (ANTIVERT) 25 MG tablet Take 25 mg by mouth 3 (three) times daily as needed for dizziness.    . mirtazapine (REMERON) 15 MG tablet Take 15 mg by mouth at bedtime.    . Multiple Vitamins-Minerals (MULTIVITAMIN & MINERAL PO) Take 1 tablet by mouth daily.      No current facility-administered medications for this visit.     Allergies:   Zolpidem tartrate and Amoxicillin    Social History:  The patient  reports that she quit smoking about 28 years ago. Her smoking use included Cigarettes. She has a 40.00 pack-year smoking history. She has never used smokeless tobacco. She reports that she does not drink alcohol or use drugs.   Family History:  The patient's family history includes Multiple sclerosis in her sister and sister; Sleep apnea in her father.   Mother pacer. AFIB   ROS:  Please see the history of present illness.   Still with baseline shortness of breath, anxiety, no bleeding, no syncope, no chest pain.   All other systems are reviewed and negative.    PHYSICAL EXAM: VS:  BP 138/82   Pulse 64   Ht 5' 4.75" (1.645 m)   Wt 262 lb (118.8 kg)   LMP  (LMP Unknown)   BMI 43.94 kg/m  , BMI Body mass index is 43.94 kg/m. GEN: Well nourished, well developed, in no acute distress walking with cane  HEENT: normal  Neck: no JVD, carotid bruits, or masses Cardiac: RRR; no murmurs, rubs, or gallops,no edema  Respiratory:  clear to auscultation bilaterally, normal work of breathing GI: soft, nontender, nondistended, + BS MS: no deformity or atrophy left foot brace Skin: warm and dry, no rash Neuro:  Alert and Oriented x 3, Strength and sensation are intact Psych: euthymic mood, full affect    EKG:  EKG is ordered today. 10/28/16-atrial fibrillation heart rate 97 bpm 06/12/15-sinus rhythm, premature atrial complexes noted. QTc interval was 495 Prior EKG demonstrated atrial fibrillation as described above.   ECHO: 11/25/14 - Left ventricle: The cavity size was normal. Systolic function wasnormal. The estimated ejection fraction was in the range of 50%to 55%. Wall motion was normal; there were no regional wall motion abnormalities. - Mitral valve: There was mild regurgitation. - Left atrium: The atrium was mildly dilated. - Right  atrium: The atrium was mildly dilated.  Nuclear stress test 06/23/15  Nuclear stress EF: 60%.  There was no ST segment deviation noted during stress.  Defect 1: There is a small defect of mild severity present in the mid anterior and apical anterior location. This is likely breast attenuation, but cannot rule out infarct with mild peri-infarct ischemia.  The study is normal.  This is a low risk study.  The left ventricular ejection fraction is normal (55-65%).     Recent Labs: 03/22/2017:  ALT 14 06/27/2017: BUN 21; Creatinine, Ser 1.19; Hemoglobin 10.9; Magnesium 2.0; Platelets 429; Potassium 4.3; Sodium 143    Lipid Panel No results found for: CHOL, TRIG, HDL, CHOLHDL, VLDL, LDLCALC, LDLDIRECT    Wt Readings from Last 3 Encounters:  07/17/17 262 lb (118.8 kg)  07/12/17 262 lb 1.9 oz (118.9 kg)  06/27/17 263 lb 12.8 oz (119.7 kg)      Other studies Reviewed: Additional studies/ records that were reviewed today include: Labs, EKGs, hospitalization, echo. Review of the above records demonstrates: As above   ASSESSMENT AND PLAN:  1.  Paroxysmal atrial fibrillation- CHADS-VASc at least 2, female, hypertension.  Doing well with rhythm control on Tikosyn initiated by Dr. Lewayne Bunting.  Previously with rate control was having sinus bradycardia feeling weak and dizzy.  Both HCTZ and verapamil were not compatible with Tikosyn.  Her blood pressure got as low as 90/50 last week and therefore her carvedilol was decreased to 6.25 mrem twice a day.  She showed me numbers today and she had one number in the 100 systolic but the remaining numbers were in the 140-150 range.  I am willing to tolerate a higher blood pressure at this time given her recent dizziness and hypotension.  2. Dyspnea-there was concern previously that this may be secondary to coronary artery disease. Stress test no evidence of ischemia.  Noninvasive study previously reassuring. Other reasons for dyspnea, deconditioning,  morbid obesity. Do not need to repeat stress test at this time.  3. Hair loss-this had subsided quite a bit after stopping metoprolol. Previously discussed.   4. Essential hypertension-mildly elevated today. At times, elevated, normal. Labile.  As described above.  5. Bipolar-medications reviewed. Per primary team, psychiatry.  6.  Morbid obesity-continue to encourage weight loss   Current medicines are reviewed at length with the patient today.  The patient does not have concerns regarding medicines.  The following changes have been made:  no change  Labs/ tests ordered today include: Stress test   No orders of the defined types were placed in this encounter.    Disposition:   In 4 months we will have her come back in and see Norma Fredrickson, NP  Signed, Donato Schultz, MD  07/17/2017 1:40 PM    [facility] [address] [place], [place], [place]  [address] Phone: [phone]; Fax: [phone]

## 2017-07-24 ENCOUNTER — Encounter: Payer: PPO | Attending: Surgery | Admitting: Surgery

## 2017-07-24 DIAGNOSIS — Z79899 Other long term (current) drug therapy: Secondary | ICD-10-CM | POA: Diagnosis not present

## 2017-07-24 DIAGNOSIS — Z6841 Body Mass Index (BMI) 40.0 and over, adult: Secondary | ICD-10-CM | POA: Diagnosis not present

## 2017-07-24 DIAGNOSIS — Z7901 Long term (current) use of anticoagulants: Secondary | ICD-10-CM | POA: Diagnosis not present

## 2017-07-24 DIAGNOSIS — Z87891 Personal history of nicotine dependence: Secondary | ICD-10-CM | POA: Diagnosis not present

## 2017-07-24 DIAGNOSIS — Z880 Allergy status to penicillin: Secondary | ICD-10-CM | POA: Insufficient documentation

## 2017-07-24 DIAGNOSIS — I4891 Unspecified atrial fibrillation: Secondary | ICD-10-CM | POA: Diagnosis not present

## 2017-07-24 DIAGNOSIS — G603 Idiopathic progressive neuropathy: Secondary | ICD-10-CM | POA: Insufficient documentation

## 2017-07-24 DIAGNOSIS — I10 Essential (primary) hypertension: Secondary | ICD-10-CM | POA: Insufficient documentation

## 2017-07-24 DIAGNOSIS — L97522 Non-pressure chronic ulcer of other part of left foot with fat layer exposed: Secondary | ICD-10-CM | POA: Diagnosis not present

## 2017-07-24 DIAGNOSIS — I739 Peripheral vascular disease, unspecified: Secondary | ICD-10-CM | POA: Insufficient documentation

## 2017-07-24 DIAGNOSIS — L84 Corns and callosities: Secondary | ICD-10-CM | POA: Diagnosis not present

## 2017-07-24 NOTE — Progress Notes (Addendum)
Yvette ShutterSUTPHEN, Loria I. ([id number]) Visit Report for 07/24/2017 Chief Complaint Document Details Patient Name: Yvette ShutterSUTPHEN, Seniyah I. Date of Service: 07/24/2017 8:00 AM Medical Record Number: [id number] Patient Account Number: [id number] Date of Birth/Sex: January 03, 1951 (66 y.o. Female) Treating RN: Curtis Sitesorthy, Joanna Primary Care Provider: Jarome MatinPATERSON, DANIEL Other Clinician: Referring Provider: Toni ArthursHEWITT, JOHN Treating Provider/Extender: Rudene ReBritto, Einar Nolasco Weeks in Treatment: 0 Information Obtained from: Patient Chief Complaint Patient presents to the wound care center for a consult due non healing wound to the left plantar foot which she's had on and off for over 2 years Electronic Signature(s) Signed: 07/24/2017 9:39:34 AM By: Evlyn KannerBritto, Escarlet Saathoff MD, FACS Entered By: Evlyn KannerBritto, Arnell Slivinski on 07/24/2017 09:39:34 Yvette ShutterSUTPHEN, Devonda I. ([id number]) -------------------------------------------------------------------------------- HPI Details Patient Name: Yvette ShutterSUTPHEN, Minola I. Date of Service: 07/24/2017 8:00 AM Medical Record Number: [id number] Patient Account Number: [id number] Date of Birth/Sex: January 03, 1951 (66 y.o. Female) Treating RN: Curtis Sitesorthy, Joanna Primary Care Provider: Jarome MatinPATERSON, DANIEL Other Clinician: Referring Provider: Toni ArthursHEWITT, JOHN Treating Provider/Extender: Rudene ReBritto, Rishard Delange Weeks in Treatment: 0 History of Present Illness Location: if plantar foot in the region of the first metatarsal head and the fifth metatarsal head Quality: Patient reports No Pain. Severity: Patient states wound are getting worse. Duration: Patient has had the wound for > 3 months prior to seeking treatment at the wound center Context: The wound would happen gradually Modifying Factors: Other treatment(s) tried include:[facility] Associated Signs and Symptoms: Patient reports having increase swelling. HPI Description: 66 year old patient [facility] where she was seen by Dr.  Laverta BaltimoreHewitt's team saw her for a left foot ulcer which was there for about 4 months. She was seen in the emergency room on 06/05/2017 and they did a MRI which ruled out osteomyelitis and they placed her on antibiotics, which was Keflex for 3 weeks. She was found to have plantar ulcers in the region of the great toe and on the fifth metatarsal head. 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 on examination the patient was found to have 2 ulcers on the plantar aspect of the left forefoot the one on the fifth metatarsal head was 2-1/2 cm in diameter and down to the muscle. There was a lot of hypertrophic granulation tissue. It did not probe down to bone. Note the patient was seen in July of this year by Dr. Victorino DikeHewitt who reviewed her left hallux valgus and rigidus problems associated with a chronic neuropathic ulcer and considered it medically necessary to consider surgery. She was recommended a hallux MP joint arthrodesis to correct the bunion and anti-tic conditions and she would weight-bear in a cam boot postoperatively. Her most recent MRI done on September 12 -- IMPRESSION: Skin wound at the fifth MTP joint without underlying abscess, septic joint or osteomyelitis.Hallux valgus and first MTP osteoarthritis. Midfoot degenerative change also noted. 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 Right toe brachial index of 0.52 and left of 0.4 suggestive of abnormal arterial flow at rest. left flow was monophasic on the dorsalis pedis and biphasic on the right side. 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 Electronic Signature(s) Signed: 07/24/2017 9:40:46 AM By: Evlyn Kanner MD, FACS Previous Signature: 07/24/2017 8:58:48 AM Version By: Evlyn Kanner MD, FACS Previous Signature: 07/24/2017 8:50:52 AM Version By: Evlyn Kanner MD, FACS Previous Signature: 07/24/2017 8:48:20 AM Version By: Evlyn Kanner MD, FACS Entered By: Evlyn Kanner on 07/24/2017 09:40:46 [name] ([id number]) -------------------------------------------------------------------------------- Physical Exam Details Patient Name: [name] Date of Service: 07/24/2017 8:00 AM Medical Record Number: [id number] Patient Account Number: [id number] Date of Birth/Sex: 1951-03-29 (66 y.o. Female) Treating RN: Curtis Sites Primary Care Provider: Jarome Matin Other Clinician: Referring Provider: Toni Arthurs Treating Provider/Extender: Rudene Re in Treatment: 0 Constitutional . Pulse regular. Respirations normal and unlabored. Afebrile. . Eyes Nonicteric. Reactive to light. Ears, Nose, Mouth, and Throat Lips, teeth, and gums WNL.Marland Kitchen Moist mucosa without lesions. Neck supple and nontender. No palpable supraclavicular or cervical adenopathy. Normal sized without goiter. Respiratory WNL. No retractions.. Cardiovascular Pedal Pulses WNL. recent arterial studies noted -- ABI on the right is 1.02 on the left is 0.73. No clubbing, cyanosis or edema. Gastrointestinal (GI) Abdomen without masses or tenderness.. No liver or spleen enlargement or tenderness.. Lymphatic No adneopathy. No adenopathy. No adenopathy. Musculoskeletal Adexa without tenderness or enlargement.. Digits and nails w/o clubbing, cyanosis, infection, petechiae, ischemia, or inflammatory conditions.. Integumentary (Hair, Skin) No suspicious lesions. No crepitus or fluctuance. No peri-wound warmth or erythema. No masses.Marland Kitchen Psychiatric Judgement and  insight Intact.. No evidence of depression, anxiety, or agitation.. Notes the patient has a large callus in the region of the first metatarsal head on the left with significant hallux valgus. She also has a large ulcer on the lateral part of the left plantar foot in the region of fifth metatarsal head. Neither of these were debrided today Electronic Signature(s) Signed: 07/24/2017 9:41:54 AM By: Evlyn Kanner MD, FACS Entered By: Evlyn Kanner on 07/24/2017 09:41:53 [name] ([id number]) -------------------------------------------------------------------------------- Physician Orders Details Patient Name: [name] Date of Service: 07/24/2017 8:00 AM Medical Record Number: [id number] Patient Account Number: [id number] Date of Birth/Sex: Nov 30, 1950 (66 y.o. Female) Treating RN: Curtis Sites Primary Care Provider: Jarome Matin Other Clinician: Referring Provider: Toni Arthurs Treating Provider/Extender: Rudene Re in Treatment: 0 Verbal / Phone Orders: No Diagnosis Coding Wound Cleansing Wound #1 Left,Plantar Metatarsal head first o Clean wound with Normal Saline. o May Shower, gently pat wound dry prior to applying new dressing. Wound #2 Left,Plantar Metatarsal head fifth o Clean wound with Normal Saline. o May Shower, gently pat wound dry prior to applying new dressing. Anesthetic Wound #1 Left,Plantar Metatarsal head first o Topical Lidocaine 4% cream applied to wound bed prior to debridement Wound #2 Left,Plantar Metatarsal head fifth o Topical Lidocaine 4% cream applied to wound bed prior to debridement Primary Wound Dressing Wound #1 Left,Plantar Metatarsal head first o Silvercel Non-Adherent Wound #2 Left,Plantar Metatarsal head fifth o Silvercel Non-Adherent Secondary Dressing Wound #1 Left,Plantar Metatarsal head first o Gauze and Kerlix/Conform Wound #2 Left,Plantar Metatarsal head fifth o Gauze and  Kerlix/Conform Dressing Change Frequency Wound #1 Left,Plantar Metatarsal head first o Change dressing every other day. Wound #2 Left,Plantar Metatarsal head fifth o Change dressing every other day. Follow-up Appointments Wound #1 Left,Plantar Metatarsal head first o Return Appointment in 1 week. Wound #2 Left,Plantar Metatarsal head fifth o Return Appointment in 1 week. TRENEE, IGOE I. ([id number]) Edema Control Wound #1 Left,Plantar Metatarsal head first o Elevate legs to the level of the heart and pump ankles as often as possible Wound #2 Left,Plantar Metatarsal  head fifth o Elevate legs to the level of the heart and pump ankles as often as possible Off-Loading Wound #1 Left,Plantar Metatarsal head first o Open toe surgical shoe to: - left foot Wound #2 Left,Plantar Metatarsal head fifth o Open toe surgical shoe to: - left foot Additional Orders / Instructions Wound #1 Left,Plantar Metatarsal head first o Increase protein intake. Wound #2 Left,Plantar Metatarsal head fifth o Increase protein intake. Home Health Wound #1 Left,Plantar Metatarsal head first o Continue Home Health Visits - [facility] o Home Health Nurse may visit PRN to address patientos wound care needs. o FACE TO FACE ENCOUNTER: MEDICARE and MEDICAID PATIENTS: I certify that this patient is under my care and that I had a face-to-face encounter that meets the physician face-to-face encounter requirements with this patient on this date. The encounter with the patient was in whole or in part for the following MEDICAL CONDITION: (primary reason for Home Healthcare) MEDICAL NECESSITY: I certify, that based on my findings, NURSING services are a medically necessary home health service. HOME BOUND STATUS: I certify that my clinical findings support that this patient is homebound (i.e., Due to illness or injury, pt requires aid of supportive devices such as crutches, cane, wheelchairs,  walkers, the use of special transportation or the assistance of another person to leave their place of residence. There is a normal inability to leave the home and doing so requires considerable and taxing effort. Other absences are for medical reasons / religious services and are infrequent or of short duration when for other reasons). o If current dressing causes regression in wound condition, may D/C ordered dressing product/s and apply Normal Saline Moist Dressing daily until next Wound Healing Center / Other MD appointment. Notify Wound Healing Center of regression in wound condition at [phone]. o Please direct any NON-WOUND related issues/requests for orders to patient's Primary Care Physician Wound #2 Left,Plantar Metatarsal head fifth o Continue Home Health Visits - [facility] o Home Health Nurse may visit PRN to address patientos wound care needs. o FACE TO FACE ENCOUNTER: MEDICARE and MEDICAID PATIENTS: I certify that this patient is under my care and that I had a face-to-face encounter that meets the physician face-to-face encounter requirements with this patient on this date. The encounter with the patient was in whole or in part for the following MEDICAL CONDITION: (primary reason for Home Healthcare) MEDICAL NECESSITY: I certify, that based on my findings, NURSING services are a medically necessary home health service. HOME BOUND STATUS: I certify that my clinical findings support that this patient is homebound (i.e., Due to illness or injury, pt requires aid of supportive devices such as crutches, cane, wheelchairs, walkers, the use of special transportation or the assistance of another person to leave their place of residence. There is a normal inability to leave the home and doing so requires considerable and taxing effort. Other absences are for medical reasons / religious services and are infrequent or of short duration when for other reasons). NETHA, DAFOE I.  ([id number]) o If current dressing causes regression in wound condition, may D/C ordered dressing product/s and apply Normal Saline Moist Dressing daily until next Wound Healing Center / Other MD appointment. Notify Wound Healing Center of regression in wound condition at [phone]. o Please direct any NON-WOUND related issues/requests for orders to patient's Primary Care Physician Consults o Vascular Electronic Signature(s) Signed: 07/24/2017 12:32:19 PM By: Evlyn Kanner MD, FACS Signed: 07/24/2017 6:04:08 PM By: Curtis Sites Entered By: Curtis Sites on 07/24/2017 09:29:19 Reinertsen,  Cyndie Mull ([id number]) -------------------------------------------------------------------------------- Problem List Details Patient Name: SRI, CLEGG Date of Service: 07/24/2017 8:00 AM Medical Record Number: [id number] Patient Account Number: [id number] Date of Birth/Sex: Sep 23, 1951 (66 y.o. 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 Female) Treating RN: Curtis Sites Primary Care Provider: Jarome Matin Other Clinician: Referring Provider: Toni Arthurs Treating Provider/Extender: Rudene Re in Treatment: 0 Subjective Chief Complaint Information obtained from Patient Patient presents to the wound care center for a consult due non healing wound to the left plantar foot which she's had on and off for over 2 years History of Present Illness (HPI) The following HPI elements were documented for the patient's wound: Location: if plantar foot in the region of the first metatarsal head and the fifth metatarsal head Quality: Patient reports No Pain. Severity: Patient states wound are getting worse. Duration: Patient has had the wound for > 3 months prior to seeking treatment at the wound center Context: The wound would happen gradually Modifying Factors: Other treatment(s) tried include:is been under the care of [facility] orth[facility] Associated Signs and Symptoms: Patient reports having increase swelling. 66 year old patient referred to was from [facility] orthoped[facility] where she was seen by Dr. Laverta Baltimore team saw her for a left foot ulcer which was there for about 4 months. She was seen in the emergency room on 06/05/2017 and they did a MRI which ruled out osteomyelitis and they placed her on antibiotics, which was Keflex for 3 weeks. She was found to have plantar ulcers in the region of the great toe and on the fifth metatarsal head. 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 on examination the patient was found to have 2 ulcers on the plantar aspect of the left forefoot the one on the fifth metatarsal head was 2-1/2 cm in diameter and down to the muscle. There was a lot of hypertrophic granulation tissue. It did not  probe down to bone. Note the patient was seen in July of this year by Dr. 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 She was recommended a hallux MP joint arthrodesis to correct the bunion and anti-tic conditions and she would weight-bear in a cam boot postoperatively. Her most recent MRI done on September 12 -- IMPRESSION: Skin wound at the fifth MTP joint without underlying abscess, septic joint or osteomyelitis.Hallux valgus and first MTP osteoarthritis. Midfoot degenerative change also noted. she also had a arterial duplex study done which showed right ABI of 1.02 and left ABI of 0.73 which was suggestive of moderate arterial occlusive disease at  rest. Right toe brachial index of 0.52 and left of 0.4 suggestive of abnormal arterial flow at rest. left flow was monophasic on the dorsalis pedis and biphasic on the right side. 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 Wound History Patient presents with 2 open wounds that have been present for approximately 8 weeks. Patient has been treating wounds in the following manner: hydrogel,. Laboratory tests have been performed in the last month. Patient reportedly has not tested positive for an antibiotic resistant organism. Patient reportedly has not tested positive for osteomyelitis. Patient reportedly has had testing performed to evaluate circulation in the legs. Patient experiences the following problems associated with their wounds: swelling. Patient History Allergies amoxicillin, Ambien Pangallo, Braylea I. ([id number]) Social History Former smoker - quit 25 years ago, Marital Status - Single, Alcohol Use - Never, Drug Use -  No History, Caffeine Use - Daily. Medical History Cardiovascular Patient has history of Arrhythmia - a fib, Hypertension Neurologic Patient has history of Neuropathy Review of Systems (ROS) Constitutional Symptoms (General Health) The patient has no complaints or symptoms. Eyes The patient has no complaints or symptoms. Ear/Nose/Mouth/Throat The patient has no complaints or symptoms. Hematologic/Lymphatic The patient has no complaints or symptoms. Respiratory The patient has no complaints or symptoms. Cardiovascular The patient has no complaints or symptoms. Gastrointestinal The patient has no complaints or symptoms. Endocrine The patient has no complaints or symptoms. Genitourinary The patient has no complaints or symptoms. Immunological The patient has no complaints or symptoms. Integumentary (Skin) The patient has no complaints or symptoms. Musculoskeletal The patient has no complaints or symptoms. Oncologic The patient has no complaints or symptoms. Psychiatric The patient has no complaints or symptoms. Medications carvedilol 12.5 mg tablet oral one half tablet oral (6.25 mg.) two times daily mirtazapine 15 mg tablet oral 1 1 tablet oral nightly acetaminophen 325 mg tablet oral 2 2 tablet oral every six hours as needed dofetilide 250 mcg capsule oral 1 1 capsule oral two times daily gabapentin 600 mg tablet oral 1 1 tablet oral four times daily meclizine 25 mg tablet oral 1 1 tablet oral three times daily as needed lisinopril 40 mg tablet oral 1 1 tablet oral daily hydralazine 50 mg tablet oral one and one half tablets oral (75 mg.) three times daily amlodipine 10 mg tablet oral 1 1 tablet oral daily cephalexin 500 mg capsule oral 1 1 capsule oral two times daily Eliquis 5 mg tablet oral 1 1 tablet oral two times daily Niferex (Sumalate-Quatrefolic) 150 mg [id number] mg-1 mg tablet oral 1 1 tablet oral daily Caffee, Rosia I. ([id number]) bupropion HCl SR 150 mg  tablet,12 hr sustained-release oral 1 1 tablet extended release 12 hr oral daily Vitamin B-12 1,000 mcg/mL injection solution injection solution injection Objective Constitutional Pulse regular. Respirations normal and unlabored. Afebrile. Vitals Time Taken: 8:50 AM, Height: 64 in, Source: Measured, Weight: 262 lbs, Source: Measured, BMI: 45, Temperature: 98.2 F, Pulse: 77 bpm, Respiratory Rate: 18 breaths/min, Blood Pressure: 138/69 mmHg. Eyes Nonicteric. Reactive to light. Ears, Nose, Mouth, and Throat Lips, teeth, and gums WNL.Marland Kitchen Moist mucosa without lesions. Neck supple and nontender. No palpable supraclavicular or cervical adenopathy. Normal sized without goiter. Respiratory WNL. No retractions.. Cardiovascular Pedal Pulses WNL. recent arterial studies noted -- ABI on the right is 1.02 on the left is 0.73. No clubbing, cyanosis or edema. Gastrointestinal (GI) Abdomen without  masses or tenderness.. No liver or spleen enlargement or tenderness.. Lymphatic No adneopathy. No adenopathy. No adenopathy. Musculoskeletal Adexa without tenderness or enlargement.. Digits and nails w/o clubbing, cyanosis, infection, petechiae, ischemia, or inflammatory conditions.Marland Kitchen Psychiatric Judgement and insight Intact.. No evidence of depression, anxiety, or agitation.. General Notes: the patient has a large callus in the region of the first metatarsal head on the left with significant hallux valgus. She also has a large ulcer on the lateral part of the left plantar foot in the region of fifth metatarsal head. Neither of these were debrided today Integumentary (Hair, Skin) No suspicious lesions. No crepitus or fluctuance. No peri-wound warmth or erythema. No masses.. Wound #1 status is Open. Original cause of wound was Gradually Appeared. The wound is located on the Left,Plantar Metatarsal head first. The wound measures 0.4cm length x 0.4cm width x 0.5cm depth; 0.126cm^2 area and 0.063cm^3 volume.  There is Fat Layer (Subcutaneous Tissue) Exposed exposed. There is no tunneling or undermining noted. There is a large amount of serosanguineous drainage noted. The wound margin is distinct with the outline attached to the wound base. There is medium (34-66%) red granulation within the wound bed. There is a medium (34-66%) amount of necrotic tissue within the wound bed including Eschar and Adherent Slough. The periwound skin appearance exhibited: Callus. The periwound skin appearance did not exhibit: Crepitus, Excoriation, Induration, Rash, Scarring, Dry/Scaly, Maceration, Atrophie Fargnoli, Kindell I. ([id number]) Blanche, Cyanosis, Ecchymosis, Hemosiderin Staining, Mottled, Pallor, Rubor, Erythema. Periwound temperature was noted as No Abnormality. The periwound has tenderness on palpation. Wound #2 status is Open. Original cause of wound was Trauma. The wound is located on the Left,Plantar Metatarsal head fifth. The wound measures 1cm length x 1.3cm width x 0.1cm depth; 1.021cm^2 area and 0.102cm^3 volume. There is Fat Layer (Subcutaneous Tissue) Exposed exposed. There is no tunneling or undermining noted. There is a large amount of serosanguineous drainage noted. The wound margin is distinct with the outline attached to the wound base. There is large (67-100%) red, hyper - granulation within the wound bed. There is a small (1-33%) amount of necrotic tissue within the wound bed including Adherent Slough. The periwound skin appearance exhibited: Callus. The periwound skin appearance did not exhibit: Crepitus, Excoriation, Induration, Rash, Scarring, Dry/Scaly, Maceration, Atrophie Blanche, Cyanosis, Ecchymosis, Hemosiderin Staining, Mottled, Pallor, Rubor, Erythema. Periwound temperature was noted as No Abnormality. The periwound has tenderness on palpation. 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 Recent workup in the hospital show no involvement of the bone but there is significant peripheral vascular disease on the left side and after review have recommended: 1. Silver alginate and an offloading felt and to wear good offloading shoes as prescribed by her orthopedic physicians 2. Offloading has been discussed with her in great detail 3. Adequate protein, vitamin A, vitamin C and zinc 4. Vascular opinion requested as her left ABI 0.73 with monophasic flow 5. Regular visits the wound center She has had several questions which have been answered to her satisfaction. Plan Wound Cleansing: Wound #1 Left,Plantar Metatarsal head first: Clean wound with Normal Saline. May Shower, gently pat  wound dry prior to applying new dressing. Wound #2 Left,Plantar Metatarsal head fifth: Clean wound with Normal Saline. May Shower, gently pat wound dry prior to applying new dressing. Anesthetic: Wound #1 Left,Plantar Metatarsal head first: Topical Lidocaine 4% cream applied to wound bed prior to debridement Mudrick, Beula I. ([id number]) Wound #2 Left,Plantar Metatarsal head fifth: Topical Lidocaine 4% cream applied to wound bed prior to debridement Primary Wound Dressing: Wound #1 Left,Plantar Metatarsal head first: Silvercel Non-Adherent Wound #2 Left,Plantar Metatarsal head fifth: Silvercel Non-Adherent Secondary Dressing: Wound #1 Left,Plantar Metatarsal head first: Gauze and Kerlix/Conform Wound #2 Left,Plantar Metatarsal head fifth: Gauze and Kerlix/Conform Dressing Change Frequency: Wound #1 Left,Plantar Metatarsal head first: Change  dressing every other day. Wound #2 Left,Plantar Metatarsal head fifth: Change dressing every other day. Follow-up Appointments: Wound #1 Left,Plantar Metatarsal head first: Return Appointment in 1 week. Wound #2 Left,Plantar Metatarsal head fifth: Return Appointment in 1 week. Edema Control: Wound #1 Left,Plantar Metatarsal head first: Elevate legs to the level of the heart and pump ankles as often as possible Wound #2 Left,Plantar Metatarsal head fifth: Elevate legs to the level of the heart and pump ankles as often as possible Off-Loading: Wound #1 Left,Plantar Metatarsal head first: Open toe surgical shoe to: - left foot Wound #2 Left,Plantar Metatarsal head fifth: Open toe surgical shoe to: - left foot Additional Orders / Instructions: Wound #1 Left,Plantar Metatarsal head first: Increase protein intake. Wound #2 Left,Plantar Metatarsal head fifth: Increase protein intake. Home Health: Wound #1 Left,Plantar Metatarsal head first: Continue Home Health Visits - [facility] Nurse may visit PRN to address patient s wound care needs. FACE TO FACE ENCOUNTER: MEDICARE and MEDICAID PATIENTS: I certify that this patient is under my care and that I had a face-to-face encounter that meets the physician face-to-face encounter requirements with this patient on this date. The encounter with the patient was in whole or in part for the following MEDICAL CONDITION: (primary reason for Home Healthcare) MEDICAL NECESSITY: I certify, that based on my findings, NURSING services are a medically necessary home health service. HOME BOUND STATUS: I certify that my clinical findings support that this patient is homebound (i.e., Due to illness or injury, pt requires aid of supportive devices such as crutches, cane, wheelchairs, walkers, the use of special transportation or the assistance of another person to leave their place of residence. There is a normal inability to leave the home and doing  so requires considerable and taxing effort. Other absences are for medical reasons / religious services and are infrequent or of short duration when for other reasons). If current dressing causes regression in wound condition, may D/C ordered dressing product/s and apply Normal Saline Moist Dressing daily until next Wound Healing Center / Other MD appointment. Notify Wound Healing Center of regression in wound condition at [phone]. Please direct any NON-WOUND related issues/requests for orders to patient's Primary Care Physician Wound #2 Left,Plantar Metatarsal head fifth: Continue Home Health Visits - [facility] Nurse may visit PRN to address patient s wound care needs. FACE TO FACE ENCOUNTER: MEDICARE and MEDICAID PATIENTS: I certify that this patient is under my care and that I had a face-to-face encounter that meets the physician face-to-face encounter requirements with this patient on this date. The encounter with the patient was in whole or in part for the following MEDICAL CONDITION: (primary reason for Home Zeiser, Burnetta I. ([id number]) Healthcare) MEDICAL NECESSITY: I certify, that based on my findings, NURSING services are a medically necessary home health  service. HOME BOUND STATUS: I certify that my clinical findings support that this patient is homebound (i.e., Due to illness or injury, pt requires aid of supportive devices such as crutches, cane, wheelchairs, walkers, the use of special transportation or the assistance of another person to leave their place of residence. There is a normal inability to leave the home and doing so requires considerable and taxing effort. Other absences are for medical reasons / religious services and are infrequent or of short duration when for other reasons). If current dressing causes regression in wound condition, may D/C ordered dressing product/s and apply Normal Saline Moist Dressing daily until next Wound Healing Center / Other  MD appointment. Notify Wound Healing Center of regression in wound condition at [phone]. Please direct any NON-WOUND related issues/requests for orders to patient's Primary Care Physician Consults ordered were: Vascular this 66 year old morbidly obese patient who has significant deformity of the foot is under the care of the orthopedic surgeons in the foot and ankle speciality, had surgery planned for this one she had medical clearance.. Recent workup in the hospital show no involvement of the bone but there is significant peripheral vascular disease on the left side and after review have recommended: 1. Silver alginate and an offloading felt and to wear good offloading shoes as prescribed by her orthopedic physicians 2. Offloading has been discussed with her in great detail 3. Adequate protein, vitamin A, vitamin C and zinc 4. Vascular opinion requested as her left ABI 0.73 with monophasic flow 5. Regular visits the wound center She has had several questions which have been answered to her satisfaction. Electronic Signature(s) Signed: 07/24/2017 9:43:51 AM By: Evlyn Kanner MD, FACS Entered By: Evlyn Kanner on 07/24/2017 09:43:51 [name] ([id number]) -------------------------------------------------------------------------------- ROS/PFSH Details Patient Name: [name] Date of Service: 07/24/2017 8:00 AM Medical Record Number: [id number] Patient Account Number: [id number] Date of Birth/Sex: Dec 04, 1950 (66 y.o. Female) Treating RN: Curtis Sites Primary Care Provider: Jarome Matin Other Clinician: Referring Provider: Toni Arthurs Treating Provider/Extender: Rudene Re in Treatment: 0 Wound History Do you currently have one or more open woundso Yes How many open wounds do you currently haveo 2 Approximately how long have you had your woundso 8 weeks How have you been treating your wound(s) until nowo hydrogel, Has your wound(s) ever healed and  then re-openedo No Have you had any lab work done in the past montho Yes Who ordered the lab work doneo [facility] Have you tested positive for an antibiotic resistant organism (MRSA, VRE)o No Have you tested positive for osteomyelitis (bone infection)o No Have you had any tests for circulation on your legso Yes Who ordered the testo hospital Where was the test [facility] Have you had other problems associated with your woundso Swelling Constitutional Symptoms (General Health) Complaints and Symptoms: No Complaints or Symptoms Eyes Complaints and Symptoms: No Complaints or Symptoms Ear/Nose/Mouth/Throat Complaints and Symptoms: No Complaints or Symptoms Hematologic/Lymphatic Complaints and Symptoms: No Complaints or Symptoms Respiratory Complaints and Symptoms: No Complaints or Symptoms Cardiovascular Complaints and Symptoms: No Complaints or Symptoms Medical History: Positive for: Arrhythmia - a fib; Hypertension Gastrointestinal Glotfelty, Laruen I. ([id number]) Complaints and Symptoms: No Complaints or Symptoms Endocrine Complaints and Symptoms: No Complaints or Symptoms Genitourinary Complaints and Symptoms: No Complaints or Symptoms Immunological Complaints and Symptoms: No Complaints or Symptoms Integumentary (Skin) Complaints and Symptoms: No Complaints or Symptoms Musculoskeletal Complaints and Symptoms: No Complaints or Symptoms Neurologic Medical History: Positive for: Neuropathy Oncologic Complaints and Symptoms: No Complaints or  Symptoms Psychiatric Complaints and Symptoms: No Complaints or Symptoms Immunizations Pneumococcal Vaccine: Received Pneumococcal Vaccination: No Implantable Devices Family and Social History Former smoker - quit 25 years ago; Marital Status - Single; Alcohol Use: Never; Drug Use: No History; Caffeine Use: Daily; Financial Concerns: No; Food, Clothing or Shelter Needs: No; Support System Lacking: No; Transportation Concerns:  No; Advanced Directives: No; Patient does not want information on Advanced Directives Physician Affirmation I have reviewed and agree with the above information. Electronic Signature(s) VOLANDA, MANGINE ([id number]) Signed: 07/24/2017 12:32:19 PM By: Evlyn Kanner MD, FACS Signed: 07/24/2017 6:04:08 PM By: Curtis Sites Entered By: Evlyn Kanner on 07/24/2017 09:17:19 [name] ([id number]) -------------------------------------------------------------------------------- SuperBill Details Patient Name: [name] Date of Service: 07/24/2017 Medical Record Number: [id number] Patient Account Number: [id number] Date of Birth/Sex: 25-May-1951 (66 y.o. 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 E66.01 Morbid (severe) obesity due to excess calories Facility Procedures CPT4 Code: 46962952 Description: 99205 - WOUND CARE VISIT-LEV 5 NEW PT Modifier: Quantity: 1 Physician Procedures CPT4 Code: 8413244 Description: 99204 - WC PHYS LEVEL 4 - NEW PT ICD-10 Diagnosis Description L97.522 Non-pressure chronic ulcer of other part of left foot with fat I73.9 Peripheral vascular disease, unspecified 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

## 2017-07-25 DIAGNOSIS — G629 Polyneuropathy, unspecified: Secondary | ICD-10-CM | POA: Diagnosis not present

## 2017-07-25 DIAGNOSIS — L03116 Cellulitis of left lower limb: Secondary | ICD-10-CM | POA: Diagnosis not present

## 2017-07-25 DIAGNOSIS — F419 Anxiety disorder, unspecified: Secondary | ICD-10-CM | POA: Diagnosis not present

## 2017-07-25 DIAGNOSIS — G4733 Obstructive sleep apnea (adult) (pediatric): Secondary | ICD-10-CM | POA: Diagnosis not present

## 2017-07-25 DIAGNOSIS — F329 Major depressive disorder, single episode, unspecified: Secondary | ICD-10-CM | POA: Diagnosis not present

## 2017-07-25 DIAGNOSIS — Z7901 Long term (current) use of anticoagulants: Secondary | ICD-10-CM | POA: Diagnosis not present

## 2017-07-25 DIAGNOSIS — L97422 Non-pressure chronic ulcer of left heel and midfoot with fat layer exposed: Secondary | ICD-10-CM | POA: Diagnosis not present

## 2017-07-25 NOTE — Progress Notes (Signed)
FORTUNE, TOROSIAN ([id number]) Visit Report for 07/24/2017 Abuse/Suicide Risk Screen Details Patient Name: [name], [name] Date of Service: 07/24/2017 8:00 AM Medical Record Number: [id number] Patient Account Number: [id number] Date of Birth/Sex: 07-18-1951 (66 y.o. Female) Treating RN: Curtis Sites Primary Care Al Gagen: Jarome Matin Other Clinician: Referring Xochilth Standish: Toni Arthurs Treating Rupa Lagan/Extender: Rudene Re in Treatment: 0 Abuse/Suicide Risk Screen Items Answer ABUSE/SUICIDE RISK SCREEN: Has anyone close to you tried to hurt or harm you recentlyo No Do you feel uncomfortable with anyone in your familyo No Has anyone forced you do things that you didnot want to doo No Do you have any thoughts of harming yourselfo No Patient displays signs or symptoms of abuse and/or neglect. No Electronic Signature(s) Signed: 07/24/2017 6:04:08 PM By: Curtis Sites Entered By: Curtis Sites on 07/24/2017 08:42:28 [name] ([id number]) -------------------------------------------------------------------------------- Activities of Daily Living Details Patient Name: [name] Date of Service: 07/24/2017 8:00 AM Medical Record Number: [id number] Patient Account Number: [id number] Date of Birth/Sex: [birth year] (66 y.o. Female) Treating RN: Curtis Sites Primary Care Keelen Quevedo: Jarome Matin Other Clinician: Referring Analeia Ismael: Toni Arthurs Treating Edan Juday/Extender: Rudene Re in Treatment: 0 Activities of Daily Living Items Answer Activities of Daily Living (Please select one for each item) Drive Automobile Completely Able Take Medications Completely Able Use Telephone Completely Able Care for Appearance Completely Able Use Toilet Completely Able Bath / Shower Completely Able Dress Self Completely Able Feed Self Completely Able Walk Completely Able Get In / Out Bed Completely Able Housework Completely Able Prepare Meals  Completely Able Handle Money Completely Able Shop for Self Completely Able Electronic Signature(s) Signed: 07/24/2017 6:04:08 PM By: Curtis Sites Entered By: Curtis Sites on 07/24/2017 08:43:07 [name] ([id number]) -------------------------------------------------------------------------------- Education Assessment Details Patient Name: [name] Date of Service: 07/24/2017 8:00 AM Medical Record Number: [id number] Patient Account Number: [id number] Date of Birth/Sex: 01/25/1951 (66 y.o. Female) Treating RN: Curtis Sites Primary Care Sueko Dimichele: Jarome Matin Other Clinician: Referring Adom Schoeneck: Toni Arthurs Treating Michel Eskelson/Extender: Rudene Re in Treatment: 0 Primary Learner Assessed: Patient Learning Preferences/Education Level/Primary Language Learning Preference: Explanation, Demonstration Highest Education Level: College or Above Preferred Language: English Cognitive Barrier Assessment/Beliefs Language Barrier: No Translator Needed: No Memory Deficit: No Emotional Barrier: No Cultural/Religious Beliefs Affecting Medical Care: No Physical Barrier Assessment Impaired Vision: No Impaired Hearing: No Decreased Hand dexterity: No Knowledge/Comprehension Assessment Knowledge Level: Medium Comprehension Level: Medium Ability to understand written Medium instructions: Ability to understand verbal Medium instructions: Motivation Assessment Anxiety Level: Calm Cooperation: Cooperative Education Importance: Acknowledges Need Interest in Health Problems: Asks Questions Perception: Coherent Willingness to Engage in Self- Medium Management Activities: Readiness to Engage in Self- Medium Management Activities: Electronic Signature(s) Signed: 07/24/2017 6:04:08 PM By: Curtis Sites Entered By: Curtis Sites on 07/24/2017 08:43:27 [name]  ([id number]) -------------------------------------------------------------------------------- Fall Risk Assessment Details Patient Name: [name] Date of Service: 07/24/2017 8:00 AM Medical Record Number: [id number] Patient Account Number: [id number] Date of Birth/Sex: Sep 30, 1950 (66 y.o. Female) Treating RN: Curtis Sites Primary Care Terrisa Curfman: Jarome Matin Other Clinician: Referring Katrinka Herbison: Toni Arthurs Treating Maecy Podgurski/Extender: Rudene Re in Treatment: 0 Fall Risk Assessment Items Have you had 2 or more falls in the last 12 monthso 0 No Have you had any fall that resulted in injury in the last 12 monthso 0 No FALL RISK ASSESSMENT: History of falling - immediate or within 3 months 0 No Secondary diagnosis 0 No Ambulatory aid None/bed rest/wheelchair/nurse 0 No Crutches/cane/walker 15 Yes Furniture 0 No IV Access/Saline  Lock 0 No Gait/Training Normal/bed rest/immobile 0 No Weak 10 Yes Impaired 0 No Mental Status Oriented to own ability 0 Yes Electronic Signature(s) Signed: 07/24/2017 6:04:08 PM By: Curtis Sitesorthy, Joanna Entered By: Curtis Sitesorthy, Joanna on 07/24/2017 08:43:55 Yvette ShutterSUTPHEN, Cherilynn I. ([id number]) -------------------------------------------------------------------------------- Nutrition Risk Assessment Details Patient Name: Yvette ShutterSUTPHEN, Keonda I. Date of Service: 07/24/2017 8:00 AM Medical Record Number: [id number] Patient Account Number: [id number] Date of Birth/Sex: 03/24/1951 (66 y.o. Female) Treating RN: Curtis Sitesorthy, Joanna Primary Care Katrece Roediger: Jarome MatinPATERSON, DANIEL Other Clinician: Referring Kordel Leavy: Toni ArthursHEWITT, JOHN Treating Courtlyn Aki/Extender: Rudene ReBritto, Errol Weeks in Treatment: 0 Height (in): Weight (lbs): Body Mass Index (BMI): Nutrition Risk Assessment Items NUTRITION RISK SCREEN: I have an illness or condition that made me change the kind and/or amount of 0 No food I eat I eat fewer than two meals per day 0 No I eat few fruits and vegetables, or  milk products 0 No I have three or more drinks of beer, liquor or wine almost every day 0 No I have tooth or mouth problems that make it hard for me to eat 0 No I don't always have enough money to buy the food I need 0 No I eat alone most of the time 0 No I take three or more different prescribed or over-the-counter drugs a day 1 Yes Without wanting to, I have lost or gained 10 pounds in the last six months 0 No I am not always physically able to shop, cook and/or feed myself 0 No Nutrition Protocols Good Risk Protocol 0 No interventions needed Moderate Risk Protocol Electronic Signature(s) Signed: 07/24/2017 6:04:08 PM By: Curtis Sitesorthy, Joanna Entered By: Curtis Sitesorthy, Joanna on 07/24/2017 08:44:04

## 2017-07-26 NOTE — Progress Notes (Signed)
Yvette, Orozco ([id number]) Visit Report for 07/24/2017 Allergy List Details Patient Name: [name], [name] Date of Service: 07/24/2017 8:00 AM Medical Record Number: [id number] Patient Account Number: [id number] Date of Birth/Sex: [birth year] (66 y.o. Female) Treating RN: Curtis Sites Primary Care Aylin Rhoads: Jarome Matin Other Clinician: Referring Ameliah Baskins: Toni Arthurs Treating Devesh Monforte/Extender: Rudene Re in Treatment: 0 Allergies Active Allergies amoxicillin Ambien Allergy Notes Electronic Signature(s) Signed: 07/24/2017 6:04:08 PM By: Curtis Sites Entered By: Curtis Sites on 07/24/2017 08:42:47 [name] ([id number]) -------------------------------------------------------------------------------- Arrival Information Details Patient Name: [name] Date of Service: 07/24/2017 8:00 AM Medical Record Number: [id number] Patient Account Number: [id number] Date of Birth/Sex: December 05, 1950 (66 y.o. Female) Treating RN: Curtis Sites Primary Care Abrina Petz: Jarome Matin Other Clinician: Referring Margaurite Salido: Toni Arthurs Treating Syreeta Figler/Extender: Rudene Re in Treatment: 0 Visit Information Patient Arrived: Cane Arrival Time: 08:40 Accompanied By: self Transfer Assistance: None Patient Identification Verified: Yes Secondary Verification Process Yes Completed: Patient Has Alerts: Yes Patient Alerts: Patient on Blood Thinner Eliquis Electronic Signature(s) Signed: 07/24/2017 6:04:08 PM By: Curtis Sites Entered By: Curtis Sites on 07/24/2017 08:41:58 [name] ([id number]) -------------------------------------------------------------------------------- Clinic Level of Care Assessment Details Patient Name: [name] Date of Service: 07/24/2017 8:00 AM Medical Record Number: [id number] Patient Account Number: [id number] Date of Birth/Sex: 1950-10-28 (66 y.o. Female) Treating RN: Curtis Sites Primary Care Shauntelle Jamerson: Jarome Matin Other Clinician: Referring Orvil Faraone: Toni Arthurs Treating Etoy Mcdonnell/Extender: Rudene Re in Treatment: 0 Clinic Level of Care Assessment Items TOOL 2 Quantity Score []  - Use when only an EandM is performed on the INITIAL visit 0 ASSESSMENTS - Nursing Assessment / Reassessment X - General Physical Exam (combine w/ comprehensive assessment (listed just below) when 1 20 performed on new pt. evals) X- 1 25 Comprehensive Assessment (HX, ROS, Risk Assessments, Wounds Hx, etc.) ASSESSMENTS - Wound and Skin Assessment / Reassessment []  - Simple Wound Assessment / Reassessment - one wound 0 X- 2 5 Complex Wound Assessment / Reassessment - multiple wounds []  - 0 Dermatologic / Skin Assessment (not related to wound area) ASSESSMENTS - Ostomy and/or Continence Assessment and Care []  - Incontinence Assessment and Management 0 []  - 0 Ostomy Care Assessment and Management (repouching, etc.) PROCESS - Coordination of Care X - Simple Patient / Family Education for ongoing care 1 15 []  - 0 Complex (extensive) Patient / Family Education for ongoing care X- 1 10 Staff obtains Chiropractor, Records, Test Results / Process Orders []  - 0 Staff telephones HHA, Nursing Homes / Clarify orders / etc []  - 0 Routine Transfer to another Facility (non-emergent condition) []  - 0 Routine Hospital Admission (non-emergent condition) X- 1 15 New Admissions / Manufacturing engineer / Ordering NPWT, Apligraf, etc. []  - 0 Emergency Hospital Admission (emergent condition) X- 1 10 Simple Discharge Coordination []  - 0 Complex (extensive) Discharge Coordination PROCESS - Special Needs []  - Pediatric / Minor Patient Management 0 []  - 0 Isolation Patient Management Orell[place], [place] I. ([id number]) []  - 0 Hearing / Language / Visual special needs []  - 0 Assessment of Community assistance (transportation, D/C planning, etc.) []  - 0 Additional assistance /  Altered mentation []  - 0 Support Surface(s) Assessment (bed, cushion, seat, etc.) INTERVENTIONS - Wound Cleansing / Measurement X - Wound Imaging (photographs - any number of wounds) 1 5 []  - 0 Wound Tracing (instead of photographs) []  - 0 Simple Wound Measurement - one wound X- 2 5 Complex Wound Measurement - multiple wounds []  - 0 Simple Wound Cleansing - one  wound X- 2 5 Complex Wound Cleansing - multiple wounds INTERVENTIONS - Wound Dressings X - Small Wound Dressing one or multiple wounds 2 10 []  - 0 Medium Wound Dressing one or multiple wounds []  - 0 Large Wound Dressing one or multiple wounds []  - 0 Application of Medications - injection INTERVENTIONS - Miscellaneous []  - External ear exam 0 []  - 0 Specimen Collection (cultures, biopsies, blood, body fluids, etc.) []  - 0 Specimen(s) / Culture(s) sent or taken to Lab for analysis []  - 0 Patient Transfer (multiple staff / Nurse, adult / Similar devices) []  - 0 Simple Staple / Suture removal (25 or less) []  - 0 Complex Staple / Suture removal (26 or more) []  - 0 Hypo / Hyperglycemic Management (close monitor of Blood Glucose) X- 1 15 Ankle / Brachial Index (ABI) - do not check if billed separately Has the patient been seen at the hospital within the last three years: Yes Total Score: 165 Level Of Care: New/Established - Level 5 Electronic Signature(s) Signed: 07/24/2017 6:04:08 PM By: Curtis Sites Entered By: Curtis Sites on 07/24/2017 09:25:41 [name] ([id number]) -------------------------------------------------------------------------------- Encounter Discharge Information Details Patient Name: [name] Date of Service: 07/24/2017 8:00 AM Medical Record Number: [id number] Patient Account Number: [id number] Date of Birth/Sex: [birth year] (66 y.o. Female) Treating RN: Curtis Sites Primary Care Zyara Riling: Jarome Matin Other Clinician: Referring Telesforo Brosnahan: Toni Arthurs Treating  Noelia Lenart/Extender: Rudene Re in Treatment: 0 Encounter Discharge Information Items Discharge Pain Level: 0 Discharge Condition: Stable Ambulatory Status: Cane Discharge Destination: Home Transportation: Private Auto Accompanied By: self Schedule Follow-up Appointment: Yes Medication Reconciliation completed and No provided to Patient/Care Yadiel Aubry: Provided on Clinical Summary of Care: 07/24/2017 Form Type Recipient Paper Patient CS Electronic Signature(s) Signed: 07/25/2017 8:45:27 AM By: Gwenlyn Perking Entered By: Gwenlyn Perking on 07/24/2017 09:53:32 [name] ([id number]) -------------------------------------------------------------------------------- Lower Extremity Assessment Details Patient Name: [name] Date of Service: 07/24/2017 8:00 AM Medical Record Number: [id number] Patient Account Number: [id number] Date of Birth/Sex: [birth year] (66 y.o. Female) Treating RN: Curtis Sites Primary Care Krisi Azua: Jarome Matin Other Clinician: Referring Armen Waring: Toni Arthurs Treating Warrick Llera/Extender: Rudene Re in Treatment: 0 Edema Assessment Assessed: [Left: No] [Right: No] Edema: [Left: N] [Right: o] Vascular Assessment Pulses: Dorsalis Pedis Palpable: [Left:Yes] Doppler Audible: [Left:Yes] Posterior Tibial Palpable: [Left:Yes] Doppler Audible: [Left:Yes] Extremity colors, hair growth, and conditions: Extremity Color: [Left:Normal] Hair Growth on Extremity: [Left:Yes] Temperature of Extremity: [Left:Warm] Capillary Refill: [Left:< 3 seconds] Blood Pressure: Brachial: [Left:136] Dorsalis Pedis: 122 [Left:Dorsalis Pedis:] Ankle: Posterior Tibial: 134 [Left:Posterior Tibial: 0.99] Toe Nail Assessment Left: Right: Thick: Yes Discolored: No Deformed: No Improper Length and Hygiene: No Electronic Signature(s) Signed: 07/24/2017 6:04:08 PM By: Curtis Sites Entered By: Curtis Sites on 07/24/2017 09:03:37 [name] ([id number]) -------------------------------------------------------------------------------- Multi Wound Chart Details Patient Name: [name] Date of Service: 07/24/2017 8:00 AM Medical Record Number: [id number] Patient Account Number: [id number] Date of Birth/Sex: [birth year] (66 y.o. 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] [2:Neuropathic Ulcer-Non Diabetic] [N/A:N/A] Comorbid History: [1:Arrhythmia, Hypertension, Neuropathy] [2:Arrhythmia, Hypertension, Neuropathy] [N/A:N/A] Date Acquired: [1:01/22/2017] [2:05/27/2017] [N/A:N/A]  Weeks of Treatment: [1:0] [2:0] [N/A:N/A] Wound Status: [1:Open] [2:Open] [N/A:N/A] Measurements L x W x D [1:0.4x0.4x0.5] [2:1x1.3x0.1] [N/A:N/A] (cm) Area (cm) : [1:0.126] [2:1.021] [N/A:N/A] Volume (cm) : [1:0.063] [2:0.102] [N/A:N/A] % Reduction in Area: [1:0.00%] [2:0.00%] [N/A:N/A] % Reduction in Volume: [1:0.00%] [2:0.00%] [N/A:N/A] Classification: [1:Full Thickness With Exposed Support Structures] [2:Full Thickness With Exposed Support Structures] [N/A:N/A] Exudate Amount: [1:Large] [2:Large] [N/A:N/A] Exudate Type: [1:Serosanguineous] [2:Serosanguineous] [N/A:N/A] Exudate Color: [1:red, brown] [2:red, brown] [N/A:N/A] Wound Margin: [1:Distinct, outline attached] [2:Distinct, outline attached] [N/A:N/A] Granulation Amount: [1:Medium (34-66%)]  [2:Large (67-100%)] [N/A:N/A] Granulation Quality: [1:Red] [2:Red, Hyper-granulation] [N/A:N/A] Necrotic Amount: [1:Medium (34-66%)] [2:Small (1-33%)] [N/A:N/A] Necrotic Tissue: [1:Eschar, Adherent Slough] [2:Adherent Slough] [N/A:N/A] Exposed Structures: [1:Fat Layer (Subcutaneous Tissue) Exposed: Yes Fascia: No Tendon: No Muscle: No Joint: No Bone: No] [2:Fat Layer (Subcutaneous Tissue) Exposed: Yes Fascia: No Tendon: No Muscle: No Joint: No Bone: No] [N/A:N/A] Epithelialization: [1:None] [2:None] [N/A:N/A] Periwound Skin Texture: [1:Callus: Yes Excoriation: No] [2:Callus: Yes Excoriation: No] [N/A:N/A] Induration: No Induration: No Crepitus: No Crepitus: No Rash: No Rash: No Scarring: No Scarring: No Periwound Skin Moisture: Maceration: No Maceration: No N/A Dry/Scaly: No Dry/Scaly: No Periwound Skin Color: Atrophie Blanche: No Atrophie Blanche: No N/A Cyanosis: No Cyanosis: No Ecchymosis: No Ecchymosis: No Erythema: No Erythema: No Hemosiderin Staining: No Hemosiderin Staining: No Mottled: No Mottled: No Pallor: No Pallor: No Rubor: No Rubor: No Temperature: No Abnormality No Abnormality N/A Tenderness on Palpation: Yes Yes N/A Wound Preparation: Ulcer Cleansing: Ulcer Cleansing: N/A Rinsed/Irrigated with Saline Rinsed/Irrigated with Saline Topical Anesthetic Applied: Topical Anesthetic Applied: Other: lidocaine 4% Other: lidocaine 4% Treatment Notes Electronic Signature(s) Signed: 07/24/2017 9:39:12 AM By: Evlyn Kanner MD, FACS Entered By: Evlyn Kanner on 07/24/2017 09:39:11 [name] ([id number]) -------------------------------------------------------------------------------- Multi-Disciplinary Care Plan Details Patient Name: [name] Date of Service: 07/24/2017 8:00 AM Medical Record Number: [id number] Patient Account Number: [id number] Date of Birth/Sex: [birth year] (66 y.o. Female) Treating RN: Curtis Sites Primary Care  Haddie Bruhl: Jarome Matin Other Clinician: Referring Ryheem Jay: Toni Arthurs Treating Jaxton Casale/Extender: Rudene Re in Treatment: 0 Active Inactive ` Abuse / Safety / Falls / Self Care Management Nursing Diagnoses: Potential for falls Goals: Patient will not experience any injury related to falls Date Initiated: 07/24/2017 Target Resolution Date: 09/30/2017 Goal Status: Active Interventions: Assess fall risk on admission and as needed Notes: ` Orientation to the Wound Care Program Nursing Diagnoses: Knowledge deficit related to the wound healing center program Goals: Patient/caregiver will verbalize understanding of the Wound Healing Center Program Date Initiated: 07/24/2017 Target Resolution Date: 09/30/2017 Goal Status: Active Interventions: Provide education on orientation to the wound center Notes: ` Wound/Skin Impairment Nursing Diagnoses: Impaired tissue integrity Goals: Ulcer/skin breakdown will heal within 14 weeks Date Initiated: 07/24/2017 Target Resolution Date: 09/30/2017 Goal Status: Active Interventions: OKLA, QAZI IMarland Kitchen ([id number]) Assess patient/caregiver ability to obtain necessary supplies Assess patient/caregiver ability to perform ulcer/skin care regimen upon admission and as needed Assess ulceration(s) every visit Notes: Electronic Signature(s) Signed: 07/24/2017 6:04:08 PM By: Curtis Sites Entered By: Curtis Sites on 07/24/2017 09:20:36 [name] ([id number]) -------------------------------------------------------------------------------- Pain Assessment Details Patient Name: [name] Date of Service: 07/24/2017 8:00 AM Medical Record Number: [id number] Patient Account Number: [id number] Date of Birth/Sex: 1950/12/14 (66 y.o. Female) Treating RN: Curtis Sites Primary Care Misao Fackrell: Jarome Matin Other Clinician: Referring Nichalas Coin: Toni Arthurs Treating Casara Perrier/Extender: Rudene Re in Treatment:  0 Active Problems Location of Pain Severity and Description of Pain Patient Has Paino Yes Site Locations Pain Location: Pain in Ulcers With Dressing  Change: Yes Duration of the Pain. Constant / Intermittento Constant Pain Management and Medication Current Pain Management: Notes Topical or injectable lidocaine is offered to patient for acute pain when surgical debridement is performed. If needed, Patient is instructed to use over the counter pain medication for the following 24-48 hours after debridement. Wound care MDs do not prescribed pain medications. Patient has chronic pain or uncontrolled pain. Patient has been instructed to make an appointment with their Primary Care Physician for pain management. Electronic Signature(s) Signed: 07/24/2017 6:04:08 PM By: Curtis Sitesorthy, Joanna Entered By: Curtis Sitesorthy, Joanna on 07/24/2017 08:42:14 Yvette ShutterSUTPHEN, Brandolyn I. ([id number]) -------------------------------------------------------------------------------- Patient/Caregiver Education Details Patient Name: Yvette ShutterSUTPHEN, Marlissa I. Date of Service: 07/24/2017 8:00 AM Medical Record Number: [id number] Patient Account Number: [id number] Date of Birth/Gender: 1951/01/12 (66 y.o. Female) Treating RN: Curtis Sitesorthy, Joanna Primary Care Physician: Jarome MatinPATERSON, DANIEL Other Clinician: Referring Physician: Toni ArthursHEWITT, JOHN Treating Physician/Extender: Rudene ReBritto, Errol Weeks in Treatment: 0 Education Assessment Education Provided To: Patient Education Topics Provided Wound/Skin Impairment: Handouts: Other: wound care as ordered Methods: Demonstration, Explain/Verbal Responses: State content correctly Electronic Signature(s) Signed: 07/24/2017 6:04:08 PM By: Curtis Sitesorthy, Joanna Entered By: Curtis Sitesorthy, Joanna on 07/24/2017 09:21:31 Yvette ShutterSUTPHEN, Darilyn I. ([id number]) -------------------------------------------------------------------------------- Wound Assessment Details Patient Name: Yvette ShutterSUTPHEN, Shekela I. Date of Service: 07/24/2017 8:00  AM Medical Record Number: [id number] Patient Account Number: [id number] Date of Birth/Sex: 1951/01/12 (66 y.o. Female) Treating RN: Curtis Sitesorthy, Joanna Primary Care Callahan Peddie: Jarome MatinPATERSON, DANIEL Other Clinician: Referring Allisen Pidgeon: Toni ArthursHEWITT, JOHN Treating Catalyna Reilly/Extender: Rudene ReBritto, Errol Weeks in Treatment: 0 Wound Status Wound Number: 1 Primary Etiology: Neuropathic Ulcer-Non Diabetic Wound Location: Left Metatarsal head first - Plantar Wound Status: Open Wounding Event: Gradually Appeared Comorbid History: Arrhythmia, Hypertension, Neuropathy Date Acquired: 01/22/2017 Weeks Of Treatment: 0 Clustered Wound: No Photos Photo Uploaded By: Curtis Sitesorthy, Joanna on 07/24/2017 12:33:15 Wound Measurements Length: (cm) 0.4 Width: (cm) 0.4 Depth: (cm) 0.5 Area: (cm) 0.126 Volume: (cm) 0.063 % Reduction in Area: 0% % Reduction in Volume: 0% Epithelialization: None Tunneling: No Undermining: No Wound Description Full Thickness With Exposed Support Classification: Structures Wound Margin: Distinct, outline attached Exudate Large Amount: Exudate Type: Serosanguineous Exudate Color: red, brown Foul Odor After Cleansing: No Slough/Fibrino Yes Wound Bed Granulation Amount: Medium (34-66%) Exposed Structure Granulation Quality: Red Fascia Exposed: No Necrotic Amount: Medium (34-66%) Fat Layer (Subcutaneous Tissue) Exposed: Yes Necrotic Quality: Eschar, Adherent Slough Tendon Exposed: No Muscle Exposed: No Joint Exposed: No Bone Exposed: No Behar, Rosemarie I. ([id number]) Periwound Skin Texture Texture Color No Abnormalities Noted: No No Abnormalities Noted: No Callus: Yes Atrophie Blanche: No Crepitus: No Cyanosis: No Excoriation: No Ecchymosis: No Induration: No Erythema: No Rash: No Hemosiderin Staining: No Scarring: No Mottled: No Pallor: No Moisture Rubor: No No Abnormalities Noted: No Dry / Scaly: No Temperature / Pain Maceration: No Temperature: No  Abnormality Tenderness on Palpation: Yes Wound Preparation Ulcer Cleansing: Rinsed/Irrigated with Saline Topical Anesthetic Applied: Other: lidocaine 4%, Treatment Notes Wound #1 (Left, Plantar Metatarsal head first) 1. Cleansed with: Clean wound with Normal Saline 2. Anesthetic Topical Lidocaine 4% cream to wound bed prior to debridement 4. Dressing Applied: Other dressing (specify in notes) 5. Secondary Dressing Applied Gauze and Kerlix/Conform 6. Footwear/Offloading device applied Felt/Foam 7. Secured with Tape Notes silvercel Electronic Signature(s) Signed: 07/24/2017 6:04:08 PM By: Curtis Sitesorthy, Joanna Entered By: Curtis Sitesorthy, Joanna on 07/24/2017 09:23:13 Yvette ShutterSUTPHEN, Norleen I. ([id number]) -------------------------------------------------------------------------------- Wound Assessment Details Patient Name: Yvette ShutterSUTPHEN, Vergie I. Date of Service: 07/24/2017 8:00 AM Medical Record Number: [id number] Patient Account Number: [id number] Date of Birth/Sex: 1951/01/12 (66 y.o. Female) Treating RN: Curtis Sitesorthy, Joanna  Primary Care Kyi Romanello: Jarome Matin Other Clinician: Referring Jadelin Eng: Toni Arthurs Treating Zamarion Longest/Extender: Rudene Re in Treatment: 0 Wound Status Wound Number: 2 Primary Etiology: Neuropathic Ulcer-Non Diabetic Wound Location: Left Metatarsal head fifth - Plantar Wound Status: Open Wounding Event: Trauma Comorbid History: Arrhythmia, Hypertension, Neuropathy Date Acquired: 05/27/2017 Weeks Of Treatment: 0 Clustered Wound: No Photos Photo Uploaded By: Curtis Sites on 07/24/2017 12:33:16 Wound Measurements Length: (cm) 1 Width: (cm) 1.3 Depth: (cm) 0.1 Area: (cm) 1.021 Volume: (cm) 0.102 % Reduction in Area: 0% % Reduction in Volume: 0% Epithelialization: None Tunneling: No Undermining: No Wound Description Full Thickness With Exposed Support Classification: Structures Wound Margin: Distinct, outline  attached Exudate Large Amount: Exudate Type: Serosanguineous Exudate Color: red, brown Foul Odor After Cleansing: No Slough/Fibrino Yes Wound Bed Granulation Amount: Large (67-100%) Exposed Structure Granulation Quality: Red, Hyper-granulation Fascia Exposed: No Necrotic Amount: Small (1-33%) Fat Layer (Subcutaneous Tissue) Exposed: Yes Necrotic Quality: Adherent Slough Tendon Exposed: No Muscle Exposed: No Joint Exposed: No Bone Exposed: No Brower, Mariena I. ([id number]) Periwound Skin Texture Texture Color No Abnormalities Noted: No No Abnormalities Noted: No Callus: Yes Atrophie Blanche: No Crepitus: No Cyanosis: No Excoriation: No Ecchymosis: No Induration: No Erythema: No Rash: No Hemosiderin Staining: No Scarring: No Mottled: No Pallor: No Moisture Rubor: No No Abnormalities Noted: No Dry / Scaly: No Temperature / Pain Maceration: No Temperature: No Abnormality Tenderness on Palpation: Yes Wound Preparation Ulcer Cleansing: Rinsed/Irrigated with Saline Topical Anesthetic Applied: Other: lidocaine 4%, Treatment Notes Wound #2 (Left, Plantar Metatarsal head fifth) 1. Cleansed with: Clean wound with Normal Saline 2. Anesthetic Topical Lidocaine 4% cream to wound bed prior to debridement 4. Dressing Applied: Other dressing (specify in notes) 5. Secondary Dressing Applied Gauze and Kerlix/Conform 6. Footwear/Offloading device applied Felt/Foam 7. Secured with Tape Notes silvercel Electronic Signature(s) Signed: 07/24/2017 6:04:08 PM By: Curtis Sites Entered By: Curtis Sites on 07/24/2017 09:23:26 [name] ([id number]) -------------------------------------------------------------------------------- Vitals Details Patient Name: [name] Date of Service: 07/24/2017 8:00 AM Medical Record Number: [id number] Patient Account Number: [id number] Date of Birth/Sex: 28-Dec-1950 (66 y.o. Female) Treating RN: Curtis Sites Primary Care Twylah Bennetts: Jarome Matin Other Clinician: Referring Deiondre Harrower: Toni Arthurs Treating Geriann Lafont/Extender: Rudene Re in Treatment: 0 Vital Signs Time Taken: 08:50 Temperature (F): 98.2 Height (in): 64 Pulse (bpm): 77 Source: Measured Respiratory Rate (breaths/min): 18 Weight (lbs): 262 Blood Pressure (mmHg): 138/69 Source: Measured Reference Range: 80 - 120 mg / dl Body Mass Index (BMI): 45 Electronic Signature(s) Signed: 07/24/2017 6:04:08 PM By: Curtis Sites Entered By: Curtis Sites on 07/24/2017 08:50:38

## 2017-07-27 DIAGNOSIS — M79641 Pain in right hand: Secondary | ICD-10-CM | POA: Diagnosis not present

## 2017-07-27 DIAGNOSIS — M19041 Primary osteoarthritis, right hand: Secondary | ICD-10-CM | POA: Diagnosis not present

## 2017-07-27 DIAGNOSIS — M19042 Primary osteoarthritis, left hand: Secondary | ICD-10-CM | POA: Diagnosis not present

## 2017-07-27 DIAGNOSIS — M79642 Pain in left hand: Secondary | ICD-10-CM | POA: Diagnosis not present

## 2017-07-28 ENCOUNTER — Other Ambulatory Visit: Payer: Self-pay

## 2017-07-28 DIAGNOSIS — L97519 Non-pressure chronic ulcer of other part of right foot with unspecified severity: Secondary | ICD-10-CM

## 2017-07-28 DIAGNOSIS — I739 Peripheral vascular disease, unspecified: Secondary | ICD-10-CM

## 2017-07-31 ENCOUNTER — Ambulatory Visit: Payer: PPO | Admitting: Surgery

## 2017-08-07 DIAGNOSIS — F329 Major depressive disorder, single episode, unspecified: Secondary | ICD-10-CM | POA: Diagnosis not present

## 2017-08-07 DIAGNOSIS — F419 Anxiety disorder, unspecified: Secondary | ICD-10-CM | POA: Diagnosis not present

## 2017-08-07 DIAGNOSIS — K219 Gastro-esophageal reflux disease without esophagitis: Secondary | ICD-10-CM | POA: Diagnosis not present

## 2017-08-07 DIAGNOSIS — Z7901 Long term (current) use of anticoagulants: Secondary | ICD-10-CM | POA: Diagnosis not present

## 2017-08-07 DIAGNOSIS — G4733 Obstructive sleep apnea (adult) (pediatric): Secondary | ICD-10-CM | POA: Diagnosis not present

## 2017-08-07 DIAGNOSIS — G629 Polyneuropathy, unspecified: Secondary | ICD-10-CM | POA: Diagnosis not present

## 2017-08-07 DIAGNOSIS — I4891 Unspecified atrial fibrillation: Secondary | ICD-10-CM | POA: Diagnosis not present

## 2017-08-07 DIAGNOSIS — E785 Hyperlipidemia, unspecified: Secondary | ICD-10-CM | POA: Diagnosis not present

## 2017-08-07 DIAGNOSIS — M170 Bilateral primary osteoarthritis of knee: Secondary | ICD-10-CM | POA: Diagnosis not present

## 2017-08-07 DIAGNOSIS — N183 Chronic kidney disease, stage 3 (moderate): Secondary | ICD-10-CM | POA: Diagnosis not present

## 2017-08-07 DIAGNOSIS — L97422 Non-pressure chronic ulcer of left heel and midfoot with fat layer exposed: Secondary | ICD-10-CM | POA: Diagnosis not present

## 2017-08-07 DIAGNOSIS — L03116 Cellulitis of left lower limb: Secondary | ICD-10-CM | POA: Diagnosis not present

## 2017-08-07 DIAGNOSIS — I129 Hypertensive chronic kidney disease with stage 1 through stage 4 chronic kidney disease, or unspecified chronic kidney disease: Secondary | ICD-10-CM | POA: Diagnosis not present

## 2017-08-07 DIAGNOSIS — Z87891 Personal history of nicotine dependence: Secondary | ICD-10-CM | POA: Diagnosis not present

## 2017-08-08 ENCOUNTER — Ambulatory Visit (INDEPENDENT_AMBULATORY_CARE_PROVIDER_SITE_OTHER): Payer: PPO | Admitting: Internal Medicine

## 2017-08-08 ENCOUNTER — Encounter: Payer: Self-pay | Admitting: Internal Medicine

## 2017-08-08 VITALS — BP 94/48 | HR 63 | Ht 64.0 in | Wt 268.8 lb

## 2017-08-08 DIAGNOSIS — I10 Essential (primary) hypertension: Secondary | ICD-10-CM

## 2017-08-08 DIAGNOSIS — I4821 Permanent atrial fibrillation: Secondary | ICD-10-CM

## 2017-08-08 DIAGNOSIS — Z5181 Encounter for therapeutic drug level monitoring: Secondary | ICD-10-CM | POA: Diagnosis not present

## 2017-08-08 DIAGNOSIS — I482 Chronic atrial fibrillation: Secondary | ICD-10-CM | POA: Diagnosis not present

## 2017-08-08 DIAGNOSIS — Z79899 Other long term (current) drug therapy: Secondary | ICD-10-CM | POA: Diagnosis not present

## 2017-08-08 NOTE — Patient Instructions (Addendum)
Medication Instructions:  Your physician recommends that you continue on your current medications as directed. Please refer to the Current Medication list given to you today.  Labwork: None ordered.  Testing/Procedures: None ordered.  Follow-Up: Your physician wants you to follow-up end of March.    Any Other Special Instructions Will Be Listed Below (If Applicable).     If you need a refill on your cardiac medications before your next appointment, please call your pharmacy.

## 2017-08-08 NOTE — Progress Notes (Signed)
HPI [name] returns today for followup. She is a pleasant 66 yo woman with a h/o PAF who has maintained NSR on dofetilide. She has a h/o arthritis and is s/p joint surgery. She has a h/o OSA.  Allergies  Allergen Reactions  . Zolpidem Tartrate Other (See Comments)    hallucinations  . Amoxicillin Rash     Current Outpatient Medications  Medication Sig Dispense Refill  . acetaminophen (TYLENOL) 325 MG tablet Take 650 mg by mouth every 6 (six) hours as needed for mild pain.    Marland Kitchen. amLODipine (NORVASC) 10 MG tablet TAKE 1 TABLET(10 MG) BY MOUTH DAILY 30 tablet 11  . buPROPion (WELLBUTRIN XL) 150 MG 24 hr tablet Take 150 mg by mouth every morning.    . carvedilol (COREG) 6.25 MG tablet Take 1 tablet (6.25 mg total) by mouth 2 (two) times daily. 180 tablet 3  . dofetilide (TIKOSYN) 250 MCG capsule Take 1 capsule (250 mcg total) by mouth 2 (two) times daily. 60 capsule 6  . ELIQUIS 5 MG TABS tablet Take 5 mg by mouth 2 (two) times daily.    . fluticasone (FLONASE) 50 MCG/ACT nasal spray Place 1 spray daily into both nostrils.    Marland Kitchen. gabapentin (NEURONTIN) 600 MG tablet Take 600 mg by mouth 4 (four) times daily.    . hydrALAZINE (APRESOLINE) 50 MG tablet Take 75 mg by mouth 3 (three) times daily.    . iron polysaccharides (NIFEREX) 150 MG capsule Take 150 mg by mouth daily.    Marland Kitchen. lisinopril (PRINIVIL,ZESTRIL) 40 MG tablet Take 40 mg by mouth daily.    . meclizine (ANTIVERT) 25 MG tablet Take 25 mg by mouth 3 (three) times daily as needed for dizziness.    . mirtazapine (REMERON) 15 MG tablet Take 15 mg by mouth at bedtime.    . Multiple Vitamins-Minerals (MULTIVITAMIN & MINERAL PO) Take 1 tablet by mouth daily.     No current facility-administered medications for this visit.      Past Medical History:  Diagnosis Date  . Anxiety   . 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  PT ALSO HAS LOWER BACK PAIN AT TIMES  . Depression   . GERD (gastroesophageal reflux disease)      RARE- OTC ANTACID IF NEEDED--PAST HX OF ULCER  . Headache    wakes up with headache   . Hyperlipidemia   . Hypertension   . Kidney stones    PT PASSED STONES--NO KNOWN STONES AT PRESENT TIMES  . Lumbago 10/02/2013  . Obesity   . OSA (obstructive sleep apnea) 12/30/2013   Very mild OSA with REM accentuation,  tested 11-29-13 at [facility], Dr Frances FurbishAthar .   Marland Kitchen. Periodic limb movement disorder (PLMD) 12/30/2013  . Restless leg syndrome   . Ulcer   . Urinary incontinence     ROS:   All systems reviewed and negative except as noted in the HPI.   Past Surgical History:  Procedure Laterality Date  . BILATERAL BUNIONECTOMY 1979    . BREAST SURGERY     biopsy   . COLON SURGERY    . SMALL INTESTINE SURGERY    . SURGERY FOR BOWEL OBSTRUCTION AND REMOVAL OF PART OF STOMACH  IN 2000  2000     Family History  Problem Relation Age of Onset  . Sleep apnea Father   . Multiple sclerosis Sister   . Multiple sclerosis Sister  Social History   Socioeconomic History  . Marital status: Single    Spouse name: Not on file  . Number of children: 0  . Years of education: 2514  . Highest education level: Not on file  Social Needs  . Financial resource strain: Not on file  . Food insecurity - worry: Not on file  . Food insecurity - inability: Not on file  . Transportation needs - medical: Not on file  . Transportation needs - non-medical: Not on file  Occupational History  . Not on file  Tobacco Use  . Smoking status: Former Smoker    Packs/day: 2.00    Years: 20.00    Pack years: 40.00    Types: Cigarettes    Last attempt to quit: 10/17/1988    Years since quitting: 28.8  . Smokeless tobacco: Never Used  Substance and Sexual Activity  . Alcohol use: No  . Drug use: No    Comment: Marijuana - 25 years ago  . Sexual activity: Not on file  Other Topics Concern  . Not on file  Social History Narrative   Patient is single and lives alone.   Patient is disabled.   Patient has a  college education.   Patient is right-handed.   Patient drinks two cokes per day.     BP (!) 94/48   Pulse 63   Ht 5\' 4"  (1.626 m)   Wt 268 lb 12.8 oz (121.9 kg)   LMP  (LMP Unknown)   SpO2 97%   BMI 46.14 kg/m   Physical Exam:  Well appearing NAD HEENT: Unremarkable Neck:  6 cm JVD, no thyromegally Lymphatics:  No adenopathy Back:  No CVA tenderness Lungs:  Clear with no wheezes HEART:  Regular rate rhythm, no murmurs, no rubs, no clicks Abd:  soft, positive bowel sounds, no organomegally, no rebound, no guarding Ext:  2 plus pulses, no edema, no cyanosis, no clubbing Skin:  No rashes no nodules Neuro:  CN II through XII intact, motor grossly intact  EKG - nsr  Assess/Plan: 1. PAF - she is maintaining NSR on dofetilide. Her QT is stable and not too long. Continue current meds. 2. HTN - her blood pressure is a little on the low side. No change in meds.   Leonia ReevesGregg Diarra Ceja,M.D.

## 2017-08-10 ENCOUNTER — Encounter: Payer: PPO | Attending: Surgery | Admitting: Surgery

## 2017-08-10 DIAGNOSIS — Z87891 Personal history of nicotine dependence: Secondary | ICD-10-CM | POA: Insufficient documentation

## 2017-08-10 DIAGNOSIS — M2022 Hallux rigidus, left foot: Secondary | ICD-10-CM | POA: Diagnosis not present

## 2017-08-10 DIAGNOSIS — L97522 Non-pressure chronic ulcer of other part of left foot with fat layer exposed: Secondary | ICD-10-CM | POA: Diagnosis not present

## 2017-08-10 DIAGNOSIS — F319 Bipolar disorder, unspecified: Secondary | ICD-10-CM | POA: Insufficient documentation

## 2017-08-10 DIAGNOSIS — I739 Peripheral vascular disease, unspecified: Secondary | ICD-10-CM | POA: Insufficient documentation

## 2017-08-10 DIAGNOSIS — I4891 Unspecified atrial fibrillation: Secondary | ICD-10-CM | POA: Diagnosis not present

## 2017-08-10 DIAGNOSIS — Z6841 Body Mass Index (BMI) 40.0 and over, adult: Secondary | ICD-10-CM | POA: Insufficient documentation

## 2017-08-10 DIAGNOSIS — I10 Essential (primary) hypertension: Secondary | ICD-10-CM | POA: Insufficient documentation

## 2017-08-10 DIAGNOSIS — G603 Idiopathic progressive neuropathy: Secondary | ICD-10-CM | POA: Diagnosis not present

## 2017-08-11 DIAGNOSIS — L03116 Cellulitis of left lower limb: Secondary | ICD-10-CM | POA: Diagnosis not present

## 2017-08-13 NOTE — Progress Notes (Signed)
Yvette, Orozco ([id number]) Visit Report for 08/10/2017 Chief Complaint Document Details Patient Name: [name], [name] Date of Service: 08/10/2017 9:30 AM Medical Record Number: [id number] Patient Account Number: [id number] Date of Birth/Sex: 1951/07/04 (66 y.o. Female) Treating RN: Curtis Sites Primary Care Provider: Jarome Matin Other Clinician: Referring Provider: Jarome Matin Treating Provider/Extender: Rudene Re in Treatment: 2 Information Obtained from: Patient Chief Complaint Patient presents to the wound care center for a consult due non healing wound to the left plantar foot which she's had on and off for over 2 years Electronic Signature(s) Signed: 08/10/2017 10:14:29 AM By: Evlyn Kanner MD, FACS Entered By: Evlyn Kanner on 08/10/2017 10:14:29 [name] ([id number]) -------------------------------------------------------------------------------- Debridement Details Patient Name: [name] Date of Service: 08/10/2017 9:30 AM Medical Record Number: [id number] Patient Account Number: [id number] Date of Birth/Sex: Nov 20, 1950 (66 y.o. Female) Treating RN: Curtis Sites Primary Care Provider: Jarome Matin Other Clinician: Referring Provider: Jarome Matin Treating Provider/Extender: Rudene Re in Treatment: 2 Debridement Performed for Wound #1 Left,Plantar Metatarsal head first Assessment: Performed By: Physician Evlyn Kanner, MD Debridement: Debridement Pre-procedure Verification/Time Yes - 09:57 Out Taken: Start Time: 09:57 Pain Control: Lidocaine 4% Topical Solution Level: Skin/Subcutaneous Tissue Total Area Debrided (L x W): 0.7 (cm) x 0.7 (cm) = 0.49 (cm) Tissue and other material Viable, Non-Viable, Callus, Fibrin/Slough, Subcutaneous debrided: Instrument: Curette Bleeding: Minimum Hemostasis Achieved: Pressure End Time: 10:00 Procedural Pain: 0 Post Procedural Pain: 0 Response to Treatment:  Procedure was tolerated well Post Debridement Measurements of Total Wound Length: (cm) 0.8 Width: (cm) 0.8 Depth: (cm) 0.5 Volume: (cm) 0.251 Character of Wound/Ulcer Post Debridement: Stable Post Procedure Diagnosis Same as Pre-procedure Electronic Signature(s) Signed: 08/10/2017 10:14:14 AM By: Evlyn Kanner MD, FACS Signed: 08/11/2017 5:02:13 PM By: Curtis Sites Entered By: Evlyn Kanner on 08/10/2017 10:14:13 [name] ([id number]) -------------------------------------------------------------------------------- Debridement Details Patient Name: [name] Date of Service: 08/10/2017 9:30 AM Medical Record Number: [id number] Patient Account Number: [id number] Date of Birth/Sex: [birth year] (66 y.o. Female) Treating RN: Curtis Sites Primary Care Provider: Jarome Matin Other Clinician: Referring Provider: Jarome Matin Treating Provider/Extender: Rudene Re in Treatment: 2 Debridement Performed for Wound #2 Left,Plantar Metatarsal head fifth Assessment: Performed By: Physician Evlyn Kanner, MD Debridement: Debridement Pre-procedure Verification/Time Yes - 09:57 Out Taken: Start Time: 09:57 Pain Control: Lidocaine 4% Topical Solution Level: Skin/Subcutaneous Tissue Total Area Debrided (L x W): 1 (cm) x 1 (cm) = 1 (cm) Tissue and other material Viable, Non-Viable, Callus, Fibrin/Slough, Subcutaneous debrided: Instrument: Curette Bleeding: Minimum Hemostasis Achieved: Pressure End Time: 10:00 Procedural Pain: 0 Post Procedural Pain: 0 Response to Treatment: Procedure was tolerated well Post Debridement Measurements of Total Wound Length: (cm) 1 Width: (cm) 1 Depth: (cm) 0.2 Volume: (cm) 0.157 Character of Wound/Ulcer Post Debridement: Improved Post Procedure Diagnosis Same as Pre-procedure Electronic Signature(s) Signed: 08/10/2017 10:14:22 AM By: Evlyn Kanner MD, FACS Signed: 08/11/2017 5:02:13 PM By: Curtis Sites Entered By: Evlyn Kanner on 08/10/2017 10:14:22 [name] ([id number]) -------------------------------------------------------------------------------- HPI Details Patient Name: [name] Date of Service: 08/10/2017 9:30 AM Medical Record Number: [id number] Patient Account Number: [id number] Date of Birth/Sex: 07-19-1951 (66 y.o. Female) Treating RN: Curtis Sites Primary Care Provider: Jarome Matin Other Clinician: Referring Provider: Jarome Matin Treating Provider/Extender: Rudene Re in Treatment: 2 History of Present Illness Location: if plantar foot in the region of the first metatarsal head and the fifth metatarsal head Quality: Patient reports No Pain. Severity: Patient states wound are getting worse. Duration: Patient has  had the wound for > 3 months prior to seeking treatment at the wound center Context: The wound would happen gradually Modifying Factors: Other treatment(s) tried include:is been under the care of [facility] orthopedics Associated Signs and Symptoms: Patient reports having increase swelling. HPI Description: 66 year old patient referred to was from [facility] orthopedics where she was seen by Dr. Laverta Baltimore team saw her for a left foot ulcer which was there for about 4 months. She was seen in the emergency room on 06/05/2017 and they did a MRI which ruled out osteomyelitis and they placed her on antibiotics, which was Keflex for 3 weeks. She was found to have plantar ulcers in the region of the great toe and on the fifth metatarsal head. 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 on examination the patient was found to have 2 ulcers on the plantar aspect of the left forefoot the one on the fifth metatarsal head was 2-1/2 cm in diameter and down to the  muscle. There was a lot of hypertrophic granulation tissue. It did not probe down to bone. Note the patient was seen in July of this year by Dr. 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 She was recommended a hallux MP joint arthrodesis to correct the bunion and anti-tic conditions and she would weight-bear in a cam boot postoperatively. Her most recent MRI done on September 12 -- IMPRESSION: Skin wound at the fifth MTP joint without underlying abscess, septic joint or osteomyelitis.Hallux valgus and first MTP osteoarthritis. Midfoot degenerative change also noted. 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 Right toe brachial index of 0.52 and left of 0.4 suggestive of abnormal arterial flow at rest. left flow was monophasic on the dorsalis pedis and biphasic on the right side. 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 08/10/2017 -- she has upcoming appointments to see Dr. Victorino Dike and also to see the vascular surgeons at [facility]. Other than that she is doing well and being compliant with a dressing. Electronic Signature(s) Signed: 08/10/2017 10:15:10 AM By: Evlyn Kanner MD, FACS Entered By: Evlyn Kanner on 08/10/2017 10:15:09 [name] ([id number]) -------------------------------------------------------------------------------- Physical Exam Details Patient Name: [name] Date of Service: 08/10/2017 9:30 AM Medical Record Number: [id number] Patient Account Number: [id number] Date of Birth/Sex: Oct 23, 1950 (66 y.o. Female) Treating  RN: Curtis Sites Primary Care Provider: Jarome Matin Other Clinician: Referring Provider: Jarome Matin Treating Provider/Extender: Rudene Re in Treatment: 2 Constitutional . Pulse regular. Respirations normal and unlabored. Afebrile. . Eyes Nonicteric. Reactive to light. Ears, Nose, Mouth, and Throat Lips, teeth, and gums WNL.Marland Kitchen Moist mucosa without lesions. Neck supple and nontender. No palpable supraclavicular or cervical adenopathy. Normal sized without goiter. Respiratory WNL. No retractions.. Cardiovascular Pedal Pulses WNL. No clubbing, cyanosis or edema. Lymphatic No adneopathy. No adenopathy. No adenopathy. Musculoskeletal Adexa without tenderness or enlargement.. Digits and nails w/o clubbing, cyanosis, infection, petechiae, ischemia, or inflammatory conditions.. Integumentary (Hair, Skin) No suspicious lesions. No crepitus or fluctuance. No peri-wound warmth or  erythema. No masses.Marland Kitchen. Psychiatric Judgement and insight Intact.. No evidence of depression, anxiety, or agitation.. Notes the patient's callus and some of the subcutaneous debris was sharply removed with a #3 curet and minimal bleeding controlled with pressure. Both wounds are looking better after the debridement Electronic Signature(s) Signed: 08/10/2017 10:15:58 AM By: Evlyn KannerBritto, Gera Inboden MD, FACS Entered By: Evlyn KannerBritto, Zenia Guest on 08/10/2017 10:15:57 Yvette ShutterSUTPHEN, Channelle I. ([id number]) -------------------------------------------------------------------------------- Physician Orders Details Patient Name: Yvette ShutterSUTPHEN, Susie I. Date of Service: 08/10/2017 9:30 AM Medical Record Number: [id number] Patient Account Number: [id number] Date of Birth/Sex: [birth year] (66 y.o. Female) Treating RN: Curtis Sitesorthy, Joanna Primary Care Provider: Jarome MatinPATERSON, DANIEL Other Clinician: Referring Provider: Jarome MatinPATERSON, DANIEL Treating Provider/Extender: Rudene ReBritto, Yeng Frankie Weeks in Treatment: 2 Verbal / Phone Orders: No Diagnosis  Coding Wound Cleansing Wound #1 Left,Plantar Metatarsal head first o Clean wound with Normal Saline. o May Shower, gently pat wound dry prior to applying new dressing. Wound #2 Left,Plantar Metatarsal head fifth o Clean wound with Normal Saline. o May Shower, gently pat wound dry prior to applying new dressing. Anesthetic Wound #1 Left,Plantar Metatarsal head first o Topical Lidocaine 4% cream applied to wound bed prior to debridement Wound #2 Left,Plantar Metatarsal head fifth o Topical Lidocaine 4% cream applied to wound bed prior to debridement Primary Wound Dressing Wound #1 Left,Plantar Metatarsal head first o Silvercel Non-Adherent Wound #2 Left,Plantar Metatarsal head fifth o Silvercel Non-Adherent Secondary Dressing Wound #1 Left,Plantar Metatarsal head first o Gauze and Kerlix/Conform Wound #2 Left,Plantar Metatarsal head fifth o Gauze and Kerlix/Conform Dressing Change Frequency Wound #1 Left,Plantar Metatarsal head first o Change dressing every other day. Wound #2 Left,Plantar Metatarsal head fifth o Change dressing every other day. Follow-up Appointments Wound #1 Left,Plantar Metatarsal head first o Return Appointment in 1 week. Wound #2 Left,Plantar Metatarsal head fifth o Return Appointment in 1 week. Cato MulliganSUTPHEN, Dreyah I. ([id number]) Edema Control Wound #1 Left,Plantar Metatarsal head first o Elevate legs to the level of the heart and pump ankles as often as possible Wound #2 Left,Plantar Metatarsal head fifth o Elevate legs to the level of the heart and pump ankles as often as possible Off-Loading Wound #1 Left,Plantar Metatarsal head first o Open toe surgical shoe to: - left foot Wound #2 Left,Plantar Metatarsal head fifth o Open toe surgical shoe to: - left foot Additional Orders / Instructions Wound #1 Left,Plantar Metatarsal head first o Increase protein intake. Wound #2 Left,Plantar Metatarsal head fifth o  Increase protein intake. Home Health o D/C Home Health Services Electronic Signature(s) Signed: 08/10/2017 11:55:06 AM By: Evlyn KannerBritto, Rheba Diamond MD, FACS Signed: 08/11/2017 5:02:13 PM By: Curtis Sitesorthy, Joanna Entered By: Curtis Sitesorthy, Joanna on 08/10/2017 10:11:38 Yvette ShutterSUTPHEN, Estephanie I. ([id number]) -------------------------------------------------------------------------------- Problem List Details Patient Name: Yvette ShutterSUTPHEN, Darshay I. Date of Service: 08/10/2017 9:30 AM Medical Record Number: [id number] Patient Account Number: [id number] Date of Birth/Sex: [birth year] (66 y.o. 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 ([id number]) -------------------------------------------------------------------------------- Progress Note Details Patient Name: Yvette ShutterSUTPHEN, Zakiya I. Date of Service: 08/10/2017 9:30 AM Medical Record Number: [id number]  Patient Account Number: [id number] Date of Birth/Sex: Mar 09, 1951 (66 y.o. Female) Treating RN: Curtis Sites Primary Care Provider: Jarome Matin Other Clinician: Referring Provider: Jarome Matin Treating Provider/Extender: Rudene Re in Treatment: 2 Subjective Chief Complaint Information obtained from Patient Patient presents to the wound care center for a consult  due non healing wound to the left plantar foot which she's had on and off for over 2 years History of Present Illness (HPI) The following HPI elements were documented for the patient's wound: Location: if plantar foot in the region of the first metatarsal head and the fifth metatarsal head Quality: Patient reports No Pain. Severity: Patient states wound are getting worse. Duration: Patient has had the wound for > 3 months prior to seeking treatment at the wound center Context: The wound would happen gradually Modifying Factors: Other treatment(s) tried include:is been under the care of [facility] orthopedics Associated Signs and Symptoms: Patient reports having increase swelling. 66 year old patient referred to was from [facility] orthopedics where she was seen by Dr. Laverta Baltimore team saw her for a left foot ulcer which was there for about 4 months. She was seen in the emergency room on 06/05/2017 and they did a MRI which ruled out osteomyelitis and they placed her on antibiotics, which was Keflex for 3 weeks. She was found to have plantar ulcers in the region of the great toe and on the fifth metatarsal head. 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 on examination the patient was found to have 2 ulcers on the plantar aspect of the left forefoot the one on the fifth metatarsal head was 2-1/2 cm in diameter and down to the muscle. There was a lot of hypertrophic granulation tissue. It did not probe down to bone. Note the patient was seen in July of this year by Dr. 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 She was recommended a hallux MP joint arthrodesis to correct the bunion and anti-tic conditions and she  would weight-bear in a cam boot postoperatively. Her most recent MRI done on September 12 -- IMPRESSION: Skin wound at the fifth MTP joint without underlying abscess, septic joint or osteomyelitis.Hallux valgus and first MTP osteoarthritis. Midfoot degenerative change also noted. 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 Right toe brachial index of 0.52 and left of 0.4 suggestive of abnormal arterial flow at rest. left flow was monophasic on the dorsalis pedis and biphasic on the right side. 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 08/10/2017 -- she has upcoming appointments to see Dr. Victorino Dike and also to see the vascular surgeons at [facility]. Other than that she is doing well and being compliant with a dressing. Patient History Information obtained from Patient. Social History Former smoker - quit 25 years ago, Marital Status - Single, Alcohol Use - Never, Drug Use - No History, Caffeine Use - Daily. NAYAB, ATEN I. ([id number]) Objective Constitutional Pulse regular. Respirations normal and unlabored. Afebrile. Vitals Time Taken: 9:33 AM, Height: 64 in, Weight: 262  lbs, BMI: 45, Temperature: 98.3 F, Pulse: 70 bpm, Respiratory Rate: 18 breaths/min, Blood Pressure: 115/56 mmHg. Eyes Nonicteric. Reactive to light. Ears, Nose, Mouth, and Throat Lips, teeth, and gums WNL.Marland Kitchen. Moist mucosa without lesions. Neck supple and nontender. No palpable supraclavicular or cervical adenopathy. Normal sized without goiter. Respiratory WNL. No retractions.. Cardiovascular Pedal Pulses WNL. No clubbing, cyanosis or edema. Lymphatic No adneopathy. No adenopathy. No  adenopathy. Musculoskeletal Adexa without tenderness or enlargement.. Digits and nails w/o clubbing, cyanosis, infection, petechiae, ischemia, or inflammatory conditions.Marland Kitchen. Psychiatric Judgement and insight Intact.. No evidence of depression, anxiety, or agitation.. General Notes: the patient's callus and some of the subcutaneous debris was sharply removed with a #3 curet and minimal bleeding controlled with pressure. Both wounds are looking better after the debridement Integumentary (Hair, Skin) No suspicious lesions. No crepitus or fluctuance. No peri-wound warmth or erythema. No masses.. Wound #1 status is Open. Original cause of wound was Gradually Appeared. The wound is located on the Left,Plantar Metatarsal head first. The wound measures 0.7cm length x 0.7cm width x 0.5cm depth; 0.385cm^2 area and 0.192cm^3 volume. There is Fat Layer (Subcutaneous Tissue) Exposed exposed. There is no tunneling or undermining noted. There is a large amount of serosanguineous drainage noted. The wound margin is distinct with the outline attached to the wound base. There is medium (34-66%) red granulation within the wound bed. There is a medium (34-66%) amount of necrotic tissue within the wound bed including Eschar and Adherent Slough. The periwound skin appearance exhibited: Callus. The periwound skin appearance did not exhibit: Crepitus, Excoriation, Induration, Rash, Scarring, Dry/Scaly, Maceration, Atrophie Blanche, Cyanosis, Ecchymosis, Hemosiderin Staining, Mottled, Pallor, Rubor, Erythema. Periwound temperature was noted as No Abnormality. The periwound has tenderness on palpation. Cato MulliganSUTPHEN, Yeraldy I. ([id number]) Wound #2 status is Open. Original cause of wound was Trauma. The wound is located on the Left,Plantar Metatarsal head fifth. The wound measures 1cm length x 1cm width x 0.1cm depth; 0.785cm^2 area and 0.079cm^3 volume. There is Fat Layer (Subcutaneous Tissue) Exposed exposed. There is no  tunneling or undermining noted. There is a large amount of serosanguineous drainage noted. The wound margin is distinct with the outline attached to the wound base. There is large (67-100%) red, hyper - granulation within the wound bed. There is a small (1-33%) amount of necrotic tissue within the wound bed including Adherent Slough. The periwound skin appearance exhibited: Callus. The periwound skin appearance did not exhibit: Crepitus, Excoriation, Induration, Rash, Scarring, Dry/Scaly, Maceration, Atrophie Blanche, Cyanosis, Ecchymosis, Hemosiderin Staining, Mottled, Pallor, Rubor, Erythema. Periwound temperature was noted as No Abnormality. The periwound has tenderness on palpation. 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 There was a Skin/Subcutaneous Tissue Debridement ([id number]) debridement with total area of 0.49 sq cm performed by Evlyn KannerBritto, Asa Fath, MD. with the following instrument(s): Curette to remove Viable and Non-Viable tissue/material including Fibrin/Slough, Callus, and Subcutaneous after achieving pain control using Lidocaine 4% Topical Solution. A time out was conducted at 09:57, prior to the start of the procedure. A Minimum amount of bleeding was controlled with Pressure. The procedure was tolerated well with a pain level of 0 throughout and a pain level of 0 following the procedure. Post Debridement Measurements: 0.8cm length  x 0.8cm width x 0.5cm depth; 0.251cm^3 volume. Character of Wound/Ulcer Post Debridement is stable. Post procedure Diagnosis Wound #1: Same as Pre-Procedure Wound #2 Pre-procedure  diagnosis of Wound #2 is a Neuropathic Ulcer-Non Diabetic located on the Left,Plantar Metatarsal head fifth . There was a Skin/Subcutaneous Tissue Debridement ([id number]) debridement with total area of 1 sq cm performed by Evlyn Kanner, MD. with the following instrument(s): Curette to remove Viable and Non-Viable tissue/material including Fibrin/Slough, Callus, and Subcutaneous after achieving pain control using Lidocaine 4% Topical Solution. A time out was conducted at 09:57, prior to the start of the procedure. A Minimum amount of bleeding was controlled with Pressure. The procedure was tolerated well with a pain level of 0 throughout and a pain level of 0 following the procedure. Post Debridement Measurements: 1cm length x 1cm width x 0.2cm depth; 0.157cm^3 volume. Character of Wound/Ulcer Post Debridement is improved. Post procedure Diagnosis Wound #2: Same as Pre-Procedure Esh, Zariel I. ([id number]) Plan Wound Cleansing: Wound #1 Left,Plantar Metatarsal head first: Clean wound with Normal Saline. May Shower, gently pat wound dry prior to applying new dressing. Wound #2 Left,Plantar Metatarsal head fifth: Clean wound with Normal Saline. May Shower, gently pat wound dry prior to applying new dressing. Anesthetic: Wound #1 Left,Plantar Metatarsal head first: Topical Lidocaine 4% cream applied to wound bed prior to debridement Wound #2 Left,Plantar Metatarsal head fifth: Topical Lidocaine 4% cream applied to wound bed prior to debridement Primary Wound Dressing: Wound #1 Left,Plantar Metatarsal head first: Silvercel Non-Adherent Wound #2 Left,Plantar Metatarsal head fifth: Silvercel Non-Adherent Secondary Dressing: Wound #1 Left,Plantar Metatarsal head first: Gauze and Kerlix/Conform Wound #2 Left,Plantar Metatarsal head fifth: Gauze and Kerlix/Conform Dressing Change Frequency: Wound #1 Left,Plantar Metatarsal head first: Change dressing every other day. Wound #2  Left,Plantar Metatarsal head fifth: Change dressing every other day. Follow-up Appointments: Wound #1 Left,Plantar Metatarsal head first: Return Appointment in 1 week. Wound #2 Left,Plantar Metatarsal head fifth: Return Appointment in 1 week. Edema Control: Wound #1 Left,Plantar Metatarsal head first: Elevate legs to the level of the heart and pump ankles as often as possible Wound #2 Left,Plantar Metatarsal head fifth: Elevate legs to the level of the heart and pump ankles as often as possible Off-Loading: Wound #1 Left,Plantar Metatarsal head first: Open toe surgical shoe to: - left foot Wound #2 Left,Plantar Metatarsal head fifth: Open toe surgical shoe to: - left foot Additional Orders / Instructions: Wound #1 Left,Plantar Metatarsal head first: Increase protein intake. Wound #2 Left,Plantar Metatarsal head fifth: Increase protein intake. Home Health: D/C Home Health Services after sharp debridement and review today, I have recommended: NATALIYA, GRAIG I. ([id number]) 1. Silver alginate and an offloading felt and to wear good offloading shoes as prescribed by her orthopedic physicians 2. Offloading has been discussed with her in great detail 3. Adequate protein, vitamin A, vitamin C and zinc 4. Vascular opinion requested as her left ABI 0.73 with monophasic flow -- appointment later at the end of this month, in [place] 5. Regular visits the wound center She has had several questions which have been answered to her satisfaction. Electronic Signature(s) Signed: 08/10/2017 10:17:20 AM By: Evlyn Kanner MD, FACS Entered By: Evlyn Kanner on 08/10/2017 10:17:20 [name] ([id number]) -------------------------------------------------------------------------------- ROS/PFSH Details Patient Name: [name] Date of Service: 08/10/2017 9:30 AM Medical Record Number: [id number] Patient Account Number: [id number] Date of Birth/Sex: 1951-02-16 (66 y.o.  Female) Treating RN: Curtis Sites Primary Care Provider: Jarome Matin Other Clinician: Referring Provider: Jarome Matin Treating Provider/Extender: Evlyn Kanner  Weeks in Treatment: 2 Information Obtained From Patient Wound History Do you currently have one or more open woundso Yes How many open wounds do you currently haveo 2 Approximately how long have you had your woundso 8 weeks How have you been treating your wound(s) until nowo hydrogel, Has your wound(s) ever healed and then re-openedo No Have you had any lab work done in the past montho Yes Who ordered the lab work doneo [facility] Have you tested positive for an antibiotic resistant organism (MRSA, VRE)o No Have you tested positive for osteomyelitis (bone infection)o No Have you had any tests for circulation on your legso Yes Who ordered the testo hospital Where was the test doneo [facility] Have you had other problems associated with your woundso Swelling Cardiovascular Medical History: Positive for: Arrhythmia - a fib; Hypertension Neurologic Medical History: Positive for: Neuropathy Immunizations Pneumococcal Vaccine: Received Pneumococcal Vaccination: No Implantable Devices Family and Social History Former smoker - quit 25 years ago; Marital Status - Single; Alcohol Use: Never; Drug Use: No History; Caffeine Use: Daily; Financial Concerns: No; Food, Clothing or Shelter Needs: No; Support System Lacking: No; Transportation Concerns: No; Advanced Directives: No; Patient does not want information on Advanced Directives Physician Affirmation I have reviewed and agree with the above information. Electronic Signature(s) Signed: 08/10/2017 11:55:06 AM By: Evlyn Kanner MD, FACS Signed: 08/11/2017 5:02:13 PM By: Curtis Sites Entered By: Evlyn Kanner on 08/10/2017 10:15:17 [name] ([id number]) [name]  ([id number]) -------------------------------------------------------------------------------- SuperBill Details Patient Name: [name] Date of Service: 08/10/2017 Medical Record Number: [id number] Patient Account Number: [id number] Date of Birth/Sex: November 06, 1950 (66 y.o. 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 (severe) obesity due to excess calories Facility Procedures CPT4 Code: [id number] Description: 11042 - DEB SUBQ TISSUE 20 SQ CM/< ICD-10 Diagnosis Description L97.522 Non-pressure chronic ulcer of other part of left foot with fat I73.9 Peripheral vascular disease, unspecified 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

## 2017-08-14 ENCOUNTER — Encounter: Payer: PPO | Admitting: Surgery

## 2017-08-14 DIAGNOSIS — L97522 Non-pressure chronic ulcer of other part of left foot with fat layer exposed: Secondary | ICD-10-CM | POA: Diagnosis not present

## 2017-08-14 DIAGNOSIS — G603 Idiopathic progressive neuropathy: Secondary | ICD-10-CM | POA: Diagnosis not present

## 2017-08-14 NOTE — Progress Notes (Addendum)
[name], [name]. ([id number]) Visit Report for 08/14/2017 Chief Complaint Document Details Patient Name: [name], [name]. Date of Service: 08/14/2017 11:00 AM Medical Record Number: [id number] Patient Account Number: [id number] Date of Birth/Sex: 01-12-1951 (66 y.o. Female) Treating RN: Curtis Sitesorthy, Joanna Primary Care Provider: Jarome MatinPATERSON, DANIEL Other Clinician: Referring Provider: Jarome MatinPATERSON, DANIEL Treating Provider/Extender: Rudene ReBritto, Gaelyn Tukes Weeks in Treatment: 3 Information Obtained from: Patient Chief Complaint Patient presents to the wound care center for a consult due non healing wound to the left plantar foot which she's had on and off for over 2 years Electronic Signature(s) Signed: 08/14/2017 11:21:27 AM By: Evlyn KannerBritto, Amrita Radu MD, FACS Entered By: Evlyn KannerBritto, Mikail Goostree on 08/14/2017 11:21:27 [name], [name]. ([id number]) -------------------------------------------------------------------------------- Debridement Details Patient Name: [name], [name]. Date of Service: 08/14/2017 11:00 AM Medical Record Number: [id number] Patient Account Number: [id number] Date of Birth/Sex: 01-12-1951 (66 y.o. Female) Treating RN: Curtis Sitesorthy, Joanna Primary Care Provider: Jarome MatinPATERSON, DANIEL Other Clinician: Referring Provider: Jarome MatinPATERSON, DANIEL Treating Provider/Extender: Rudene ReBritto, Oryon Gary Weeks in Treatment: 3 Debridement Performed for Wound #1 Left,Plantar Metatarsal head first Assessment: Performed By: Physician Evlyn KannerBritto, Zarria Towell, MD Debridement: Debridement Pre-procedure Verification/Time Yes - 11:12 Out Taken: Start Time: 11:12 Pain Control: Lidocaine 4% Topical Solution Level: Skin/Subcutaneous Tissue Total Area Debrided (L x W): 0.5 (cm) x 0.4 (cm) = 0.2 (cm) Tissue and other material Viable, Non-Viable, Callus, Fibrin/Slough, Subcutaneous debrided: Instrument: Curette Bleeding: Minimum Hemostasis Achieved: Pressure End Time: 11:14 Procedural Pain: 0 Post Procedural Pain: 0 Response to  Treatment: Procedure was tolerated well Post Debridement Measurements of Total Wound Length: (cm) 0.7 Width: (cm) 0.8 Depth: (cm) 0.4 Volume: (cm) 0.176 Character of Wound/Ulcer Post Debridement: Improved Post Procedure Diagnosis Same as Pre-procedure Electronic Signature(s) Signed: 08/14/2017 11:21:11 AM By: Evlyn KannerBritto, Ralphine Hinks MD, FACS Signed: 08/14/2017 4:43:33 PM By: Curtis Sitesorthy, Joanna Entered By: Evlyn KannerBritto, Terence Bart on 08/14/2017 11:21:11 [name], [name]. ([id number]) -------------------------------------------------------------------------------- Debridement Details Patient Name: [name], [name]. Date of Service: 08/14/2017 11:00 AM Medical Record Number: [id number] Patient Account Number: [id number] Date of Birth/Sex: 01-12-1951 (66 y.o. Female) Treating RN: Curtis Sitesorthy, Joanna Primary Care Provider: Jarome MatinPATERSON, DANIEL Other Clinician: Referring Provider: Jarome MatinPATERSON, DANIEL Treating Provider/Extender: Rudene ReBritto, Estefana Taylor Weeks in Treatment: 3 Debridement Performed for Wound #2 Left,Plantar Metatarsal head fifth Assessment: Performed By: Physician Evlyn KannerBritto, Kosisochukwu Burningham, MD Debridement: Debridement Pre-procedure Verification/Time Yes - 11:14 Out Taken: Start Time: 11:14 Pain Control: Lidocaine 4% Topical Solution Level: Skin/Subcutaneous Tissue Total Area Debrided (L x W): 0.9 (cm) x 1.1 (cm) = 0.99 (cm) Tissue and other material Viable, Non-Viable, Callus, Fibrin/Slough, Subcutaneous debrided: Instrument: Curette Bleeding: Minimum Hemostasis Achieved: Pressure End Time: 11:16 Procedural Pain: 0 Post Procedural Pain: 0 Response to Treatment: Procedure was tolerated well Post Debridement Measurements of Total Wound Length: (cm) 0.9 Width: (cm) 1.1 Depth: (cm) 0.2 Volume: (cm) 0.156 Character of Wound/Ulcer Post Debridement: Improved Post Procedure Diagnosis Same as Pre-procedure Electronic Signature(s) Signed: 08/14/2017 11:21:19 AM By: Evlyn KannerBritto, Maziah Smola MD, FACS Signed: 08/14/2017 4:43:33  PM By: Curtis Sitesorthy, Joanna Entered By: Evlyn KannerBritto, Johnathen Testa on 08/14/2017 11:21:18 [name], [name]. ([id number]) -------------------------------------------------------------------------------- HPI Details Patient Name: [name], [name]. Date of Service: 08/14/2017 11:00 AM Medical Record Number: [id number] Patient Account Number: [id number] Date of Birth/Sex: 01-12-1951 (66 y.o. Female) Treating RN: Curtis Sitesorthy, Joanna Primary Care Provider: Jarome MatinPATERSON, DANIEL Other Clinician: Referring Provider: Jarome MatinPATERSON, DANIEL Treating Provider/Extender: Rudene ReBritto, Katelynn Heidler Weeks in Treatment: 3 History of Present Illness Location: if plantar foot in the region of the first metatarsal head and the fifth metatarsal head Quality: Patient reports No Pain. Severity: Patient states wound are getting worse. Duration: Patient has  had the wound for > 3 months prior to seeking treatment at the wound center Context: The wound would happen gradually Modifying Factors: Other treatment(s) tried include:is been under the care of [facility] orthopedics Associated Signs and Symptoms: Patient reports having increase swelling. HPI Description: 66 year old patient referred to was from [facility] orthopedics where she was seen by Dr. Laverta Baltimore team saw her for a left foot ulcer which was there for about 4 months. She was seen in the emergency room on 06/05/2017 and they did a MRI which ruled out osteomyelitis and they placed her on antibiotics, which was Keflex for 3 weeks. She was found to have plantar ulcers in the region of the great toe and on the fifth metatarsal head. 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 on examination the patient was found to have 2 ulcers on the plantar aspect of the left forefoot the one on the fifth metatarsal head was 2-1/2 cm in diameter  and down to the muscle. There was a lot of hypertrophic granulation tissue. It did not probe down to bone. Note the patient was seen in July of this year by Dr. 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 She was recommended a hallux MP joint arthrodesis to correct the bunion and anti-tic conditions and she would weight-bear in a cam boot postoperatively. Her most recent MRI done on September 12 -- IMPRESSION: Skin wound at the fifth MTP joint without underlying abscess, septic joint or osteomyelitis.Hallux valgus and first MTP osteoarthritis. Midfoot degenerative change also noted. 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 Right toe brachial index of 0.52 and left of 0.4 suggestive of abnormal arterial flow at rest. left flow was monophasic on the dorsalis pedis and biphasic on the right side. 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 08/10/2017 -- she has upcoming appointments to see Dr. Victorino Dike and also to see the vascular surgeons at [facility]. Other than that she is doing well and being compliant with a dressing. 08/14/2017 -- she saw Dr. Victorino Dike earlier this morning and his note is pending. She has not yet had her appointment at the vascular office as yet. Electronic Signature(s) Signed: 08/14/2017 11:21:54 AM By: Evlyn Kanner MD, FACS Entered By: Evlyn Kanner on 08/14/2017 11:21:54 [name] ([id number]) -------------------------------------------------------------------------------- Physical Exam Details Patient Name: [name] Date of Service: 08/14/2017 11:00 AM Medical Record Number: [id number] Patient Account Number: [id number] Date of Birth/Sex: 04/23/1951 (66 y.o. Female) Treating RN: Curtis Sites Primary Care Provider: Jarome Matin Other Clinician: Referring Provider: Jarome Matin Treating Provider/Extender: Rudene Re in Treatment: 3 Constitutional . Pulse regular. Respirations normal and unlabored. Afebrile. . Eyes Nonicteric. Reactive to light. Ears, Nose, Mouth, and Throat Lips, teeth, and gums WNL.Marland Kitchen Moist mucosa without lesions. Neck supple and nontender. No palpable supraclavicular or cervical adenopathy. Normal sized without goiter. Respiratory WNL. No retractions.. Cardiovascular Pedal Pulses WNL. No clubbing, cyanosis or edema. Lymphatic No adneopathy. No adenopathy. No adenopathy. Musculoskeletal Adexa without tenderness or  enlargement.. Digits and nails w/o clubbing, cyanosis, infection, petechiae, ischemia, or inflammatory conditions.. Integumentary (Hair, Skin) No suspicious lesions. No crepitus or fluctuance. No peri-wound warmth or erythema. No masses.Marland Kitchen Psychiatric Judgement and insight Intact.. No evidence of depression, anxiety, or agitation.. Notes both the wounds on the plantar aspect of the left foot revealed sharp debridement with #3 curet and minimal bleeding controlled with pressure Electronic Signature(s) Signed: 08/14/2017 11:22:34 AM By: Evlyn Kanner MD, FACS Entered By: Evlyn Kanner on 08/14/2017 11:22:33 [name] ([id number]) -------------------------------------------------------------------------------- Physician Orders Details Patient Name: [name] Date of Service: 08/14/2017 11:00 AM Medical Record Number: [id number] Patient Account Number: [id number] Date of Birth/Sex: [birth year] (66 y.o. 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 o May Shower, gently pat wound dry prior to applying new dressing. Wound #2 Left,Plantar Metatarsal head fifth o Clean wound with Normal Saline. o May Shower, gently pat wound dry prior to applying new dressing. Anesthetic Wound #1 Left,Plantar Metatarsal head first o Topical Lidocaine 4% cream applied to wound bed prior to debridement Wound #2 Left,Plantar Metatarsal head fifth o Topical Lidocaine 4% cream applied to wound bed prior to debridement Primary Wound Dressing Wound #1 Left,Plantar Metatarsal head first o [place]cel Non-Adherent Wound #2 Left,Plantar Metatarsal head fifth o [place] Non-Adherent Secondary Dressing Wound #1 Left,Plantar Metatarsal head first o Gauze and Kerlix/Conform Wound #2 Left,Plantar Metatarsal head fifth o Gauze and Kerlix/Conform Dressing Change Frequency Wound #1 Left,Plantar Metatarsal head first o Change dressing every other day. Wound #2 Left,Plantar Metatarsal head fifth Perris, Shirleen Orozco. ([id number]) o Change dressing every other day. Follow-up Appointments Wound #1 Left,Plantar Metatarsal head first o Return Appointment in 1 week. Wound #2 Left,Plantar Metatarsal head fifth o Return Appointment in 1 week. Edema Control Wound #1 Left,Plantar Metatarsal head first o Elevate legs to the level of the heart and pump ankles as often as possible Wound #2 Left,Plantar Metatarsal head fifth o Elevate legs to the level of the heart and pump ankles  as often as possible Off-Loading Wound #1 Left,Plantar Metatarsal head first o Open toe surgical shoe to: - left foot, felt Wound #2 Left,Plantar Metatarsal head fifth o Open toe surgical shoe to: - left foot, felt Additional Orders / Instructions Wound #1 Left,Plantar Metatarsal head first o Increase protein intake. Wound #2 Left,Plantar Metatarsal head fifth o Increase protein intake. Electronic Signature(s) Signed: 08/14/2017 4:20:07 PM By: Evlyn Kanner MD, FACS Signed: 08/14/2017 4:43:33 PM By: Curtis Sites Entered By: Curtis Sites on 08/14/2017 11:17:32 [name] ([id number]) -------------------------------------------------------------------------------- Problem List Details Patient Name: [name] Date of Service: 08/14/2017 11:00 AM Medical Record Number: [id number] Patient Account Number: [id number] Date of Birth/Sex: [birth year] (66 y.o. 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 07/24/2017 Yes E66.01 Morbid (severe) obesity due  to excess calories 07/24/2017 Yes Inactive Problems Resolved Problems Electronic Signature(s) Signed: 08/14/2017 11:20:55 AM By: Evlyn Kanner MD, FACS Previous Signature: 08/14/2017 11:03:23 AM Version By: Evlyn Kanner MD, FACS Entered By: Evlyn Kanner on 08/14/2017 11:20:55 [name] ([id number]) -------------------------------------------------------------------------------- Progress Note Details Patient Name: [name] Date of Service: 08/14/2017 11:00 AM Medical  Record Number: [id number] Patient Account Number: [id number] Date of Birth/Sex: 09/21/1951 (66 y.o. Female) Treating RN: Curtis Sites Primary Care Provider: Jarome Matin Other Clinician: Referring Provider: Jarome Matin Treating Provider/Extender: Rudene Re in Treatment: 3 Subjective Chief Complaint Information obtained from Patient Patient presents to the wound care center for a consult due non healing wound to the left plantar foot which she's had on and off for over 2 years History of Present Illness (HPI) The following HPI elements were documented for the patient's wound: Location: if plantar foot in the region of the first metatarsal head and the fifth metatarsal head Quality: Patient reports No Pain. Severity: Patient states wound are getting worse. Duration: Patient has had the wound for > 3 months prior to seeking treatment at the wound center Context: The wound would happen gradually Modifying Factors: Other treatment(s) tried include:is been under the care of [facility] orthopedics Associated Signs and Symptoms: Patient reports having increase swelling. 66 year old patient referred to was from [facility] orthopedics where she was seen by Dr. Laverta Baltimore team saw her for a left foot ulcer which was there for about 4 months. She was seen in the emergency room on 06/05/2017 and they did a MRI which ruled out osteomyelitis and they placed her on antibiotics, which was Keflex for 3 weeks. She was found to have plantar ulcers in the region of the great toe and on the fifth metatarsal head. 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 on examination the patient was found to have 2 ulcers on the plantar aspect of the left forefoot the one on the fifth metatarsal head was 2-1/2 cm in diameter and down to the  muscle. There was a lot of hypertrophic granulation tissue. It did not probe down to bone. Note the patient was seen in July of this year by Dr. 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 She was recommended a hallux MP joint arthrodesis to correct the bunion and anti-tic conditions and she would weight-bear in a cam boot postoperatively. Her most recent MRI done on September 12 -- IMPRESSION: Skin wound at the fifth MTP joint without underlying abscess, septic joint or osteomyelitis.Hallux valgus and first MTP osteoarthritis. Midfoot degenerative change also noted. 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 Right toe brachial index of 0.52 and left of 0.4 suggestive of abnormal arterial flow at rest. left flow was monophasic on the dorsalis pedis and biphasic on the right side. 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 08/10/2017 -- she has upcoming appointments to see Dr. Victorino Dike and also to see the vascular surgeons at [facility]. Other than that she is doing well  and being compliant with a dressing. 08/14/2017 -- she saw Dr. Victorino Dike earlier this morning and his note is pending. She has not yet had her appointment at the vascular office as yet. Patient History Information obtained from Patient. Social History [name], [name] ([id number]) Former smoker - quit 25 years ago, Marital Status - Single, Alcohol Use - Never, Drug Use - No History, Caffeine Use - Daily. Objective Constitutional Pulse regular. Respirations normal and unlabored.  Afebrile. Vitals Time Taken: 10:58 AM, Height: 64 in, Weight: 262 lbs, BMI: 45, Temperature: 97.8 F, Pulse: 69 bpm, Respiratory Rate: 18 breaths/min, Blood Pressure: 147/60 mmHg. Eyes Nonicteric. Reactive to light. Ears, Nose, Mouth, and Throat Lips, teeth, and gums WNL.Marland Kitchen Moist mucosa without lesions. Neck supple and nontender. No palpable supraclavicular or cervical adenopathy. Normal sized without goiter. Respiratory WNL. No retractions.. Cardiovascular Pedal Pulses WNL. No clubbing, cyanosis or edema. Lymphatic No adneopathy. No adenopathy. No adenopathy. Musculoskeletal Adexa without tenderness or enlargement.. Digits and nails w/o clubbing, cyanosis, infection, petechiae, ischemia, or inflammatory conditions.Marland Kitchen Psychiatric Judgement and insight Intact.. No evidence of depression, anxiety, or agitation.. General Notes: both the wounds on the plantar aspect of the left foot revealed sharp debridement with #3 curet and minimal bleeding controlled with pressure Integumentary (Hair, Skin) No suspicious lesions. No crepitus or fluctuance. No peri-wound warmth or erythema. No masses.. Wound #1 status is Open. Original cause of wound was Gradually Appeared. The wound is located on the Left,Plantar Metatarsal head first. The wound measures 0.5cm length x 0.4cm width x 0.4cm depth; 0.157cm^2 area and 0.063cm^3 volume. There is Fat Layer (Subcutaneous Tissue) Exposed exposed. There is no tunneling or undermining noted. There is a large amount of serosanguineous drainage noted. The wound margin is distinct with the outline attached to the wound base. There is medium (34-66%) red granulation within the wound bed. There is a medium (34-66%) amount of necrotic tissue within the wound bed including Eschar and Adherent Slough. The periwound skin appearance exhibited: Callus. The [name], [name]. ([id number]) periwound skin appearance did not exhibit: Crepitus, Excoriation, Induration, Rash,  Scarring, Dry/Scaly, Maceration, Atrophie Blanche, Cyanosis, Ecchymosis, Hemosiderin Staining, Mottled, Pallor, Rubor, Erythema. Periwound temperature was noted as No Abnormality. The periwound has tenderness on palpation. Wound #2 status is Open. Original cause of wound was Trauma. The wound is located on the Left,Plantar Metatarsal head fifth. The wound measures 0.9cm length x 1.1cm width x 0.1cm depth; 0.778cm^2 area and 0.078cm^3 volume. There is Fat Layer (Subcutaneous Tissue) Exposed exposed. There is no tunneling or undermining noted. There is a large amount of serosanguineous drainage noted. The wound margin is distinct with the outline attached to the wound base. There is large (67-100%) red, hyper - granulation within the wound bed. There is a small (1-33%) amount of necrotic tissue within the wound bed including Adherent Slough. The periwound skin appearance exhibited: Callus. The periwound skin appearance did not exhibit: Crepitus, Excoriation, Induration, Rash, Scarring, Dry/Scaly, Maceration, Atrophie Blanche, Cyanosis, Ecchymosis, Hemosiderin Staining, Mottled, Pallor, Rubor, Erythema. Periwound temperature was noted as No Abnormality. The periwound has tenderness on palpation. 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 There was a Skin/Subcutaneous Tissue Debridement ([id number]) debridement with total area of 0.2  sq cm performed by Evlyn KannerBritto, Milica Gully, MD. with the following instrument(s): Curette to remove Viable and Non-Viable tissue/material including Fibrin/Slough, Callus, and Subcutaneous after achieving pain  control using Lidocaine 4% Topical Solution. A time out was conducted at 11:12, prior to the start of the procedure. A Minimum amount of bleeding was controlled with Pressure. The procedure was tolerated well with a pain level of 0 throughout and a pain level of 0 following the procedure. Post Debridement Measurements: 0.7cm length x 0.8cm width x 0.4cm depth; 0.176cm^3 volume. Character of Wound/Ulcer Post Debridement is improved. 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 There was a Skin/Subcutaneous Tissue Debridement ([id number]) debridement with total area of 0.99 sq cm performed by Evlyn KannerBritto, Marleni Gallardo, MD. with the following instrument(s): Curette to remove Viable and Non-Viable tissue/material including Fibrin/Slough, Callus, and Subcutaneous after achieving pain control using Lidocaine 4% Topical Solution. A time out was conducted at 11:14, prior to the start of the procedure. A Minimum amount of bleeding was controlled with Pressure. The procedure was tolerated well with a pain level of 0 throughout and a pain level of 0 following the procedure. Post Debridement Measurements: 0.9cm length x 1.1cm width x 0.2cm depth; 0.156cm^3 volume. Character of Wound/Ulcer Post Debridement is improved. Post procedure Diagnosis Wound #2: Same as Pre-Procedure [name], [name]. ([id number]) Plan Wound Cleansing: Wound #1 Left,Plantar Metatarsal head first: Clean wound with Normal Saline. May Shower, gently pat wound dry prior to applying new dressing. Wound #2 Left,Plantar Metatarsal head fifth: Clean wound with Normal Saline. May Shower, gently pat wound dry prior to applying new dressing. Anesthetic: Wound #1 Left,Plantar Metatarsal head first: Topical Lidocaine 4% cream applied to wound bed prior to debridement Wound #2 Left,Plantar Metatarsal head  fifth: Topical Lidocaine 4% cream applied to wound bed prior to debridement Primary Wound Dressing: Wound #1 Left,Plantar Metatarsal head first: [place] Non-Adherent Wound #2 Left,Plantar Metatarsal head fifth: [place] Non-Adherent Secondary Dressing: Wound #1 Left,Plantar Metatarsal head first: Gauze and Kerlix/Conform Wound #2 Left,Plantar Metatarsal head fifth: Gauze and Kerlix/Conform Dressing Change Frequency: Wound #1 Left,Plantar Metatarsal head first: Change dressing every other day. Wound #2 Left,Plantar Metatarsal head fifth: Change dressing every other day. Follow-up Appointments: Wound #1 Left,Plantar Metatarsal head first: Return Appointment in 1 week. Wound #2 Left,Plantar Metatarsal head fifth: Return Appointment in 1 week. Edema Control: Wound #1 Left,Plantar Metatarsal head first: Elevate legs to the level of the heart and pump ankles as often as possible Wound #2 Left,Plantar Metatarsal head fifth: Elevate legs to the level of the heart and pump ankles as often as possible Off-Loading: Wound #1 Left,Plantar Metatarsal head first: Open toe surgical shoe to: - left foot, felt Wound #2 Left,Plantar Metatarsal head fifth: Open toe surgical shoe to: - left foot, felt Additional Orders / Instructions: Wound #1 Left,Plantar Metatarsal head first: Increase protein intake. Wound #2 Left,Plantar Metatarsal head fifth: Increase protein intake. [name], [name]. ([id number]) after sharp debridement and review today, Orozco have recommended: 1. Silver alginate and an offloading felt and to wear good offloading shoes as prescribed by her orthopedic physicians 2. will await Dr. Laverta BaltimoreHewitt's opinion, who saw her earlier today 3. Offloading has been discussed with her in great detail 4. Adequate protein, vitamin A, vitamin C and zinc 5. Vascular opinion requested as her left ABI 0.73 with monophasic flow -- [place] of this [place] 6.  Regular  visits the wound center She has had several questions which have been answered to her satisfaction. Electronic Signature(s) Signed: 08/14/2017 11:24:32 AM By: Evlyn Kanner MD, FACS Entered By: Evlyn Kanner on 08/14/2017 11:24:32 [name] ([id number]) -------------------------------------------------------------------------------- ROS/PFSH Details Patient Name: [name] Date of Service: 08/14/2017 11:00 AM Medical Record Number: [id number] Patient Account Number: [id number] Date of Birth/Sex: [birth year] (66 y.o. Female) Treating RN: Curtis Sites Primary Care Provider: Jarome Matin Other Clinician: Referring Provider: Jarome Matin Treating Provider/Extender: Rudene Re in Treatment: 3 Information Obtained From Patient Wound History Do you currently have one or more open woundso Yes How many open wounds do you currently haveo 2 Approximately how long have you had your woundso 8 weeks How have you been treating your wound(s) until nowo hydrogel, Has your wound(s) ever healed and then re-openedo No Have you had any lab work done in the past montho Yes Who ordered the lab work doneo [facility] Have you tested positive for an antibiotic resistant organism (MRSA, VRE)o No Have you tested positive for osteomyelitis (bone infection)o No Have you had any tests for circulation on your legso Yes Who ordered the testo hospital Where was the test doneo [facility] Have you had other problems associated with your woundso Swelling Cardiovascular Medical History: Positive for: Arrhythmia - a fib; Hypertension Neurologic Medical History: Positive for: Neuropathy Immunizations Pneumococcal Vaccine: Received Pneumococcal Vaccination: No Implantable Devices Family and Social History Former smoker - quit 25 years ago; Marital Status - Single; Alcohol Use: Never; Drug Use: No History; Caffeine Use: Daily; Financial Concerns: No; Food, Clothing or Shelter Needs: No;  Support System Lacking: No; Transportation Concerns: No; Advanced Directives: No; Patient does not want information on Advanced Directives Physician Affirmation Orozco have reviewed and agree with the above information. Electronic Signature(s) Signed: 08/14/2017 4:20:07 PM By: Evlyn Kanner MD, FACS Signed: 08/14/2017 4:43:33 PM By: Curtis Sites Entered By: Evlyn Kanner on 08/14/2017 11:22:02 [name] ([id number]) [name] ([id number]) -------------------------------------------------------------------------------- SuperBill Details Patient Name: [name] Date of Service: 08/14/2017 Medical Record Number: [id number] Patient Account Number: [id number] Date of Birth/Sex: [birth year] (66 y.o. 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 Morbid (severe) obesity due to excess calories Facility Procedures CPT4 Code: [id number] Description: 11042 - DEB SUBQ TISSUE 20 SQ CM/< ICD-10 Diagnosis Description L97.522 Non-pressure chronic ulcer of other part of left foot with fat I73.9 Peripheral vascular disease, unspecified 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

## 2017-08-15 NOTE — Progress Notes (Signed)
MYKALA, MCCREADY ([id number]) Visit Report for 08/10/2017 Arrival Information Details Patient Name: Yvette, Orozco Date of Service: 08/10/2017 9:30 AM Medical Record Number: [id number] Patient Account Number: [id number] Date of Birth/Sex: [birth year] (66 y.o. Female) Treating RN: Curtis Sites Primary Care Tung Pustejovsky: Jarome Matin Other Clinician: Referring Demetrius Barrell: Jarome Matin Treating Lanayah Gartley/Extender: Rudene Re in Treatment: 2 Visit Information History Since Last Visit Added or deleted any medications: No Patient Arrived: Cane Any new allergies or adverse reactions: No Arrival Time: 09:32 Had a fall or experienced change in No Accompanied By: self activities of daily living that may affect Transfer Assistance: None risk of falls: Patient Identification Verified: Yes Signs or symptoms of abuse/neglect since last visito No Secondary Verification Process Yes Hospitalized since last visit: No Completed: Has Dressing in Place as Prescribed: Yes Patient Has Alerts: Yes Pain Present Now: No Patient Alerts: Patient on Blood Thinner Eliquis Electronic Signature(s) Signed: 08/11/2017 5:02:13 PM By: Curtis Sites Entered By: Curtis Sites on 08/10/2017 09:32:59 [name] ([id number]) -------------------------------------------------------------------------------- Encounter Discharge Information Details Patient Name: [name] Date of Service: 08/10/2017 9:30 AM Medical Record Number: [id number] Patient Account Number: [id number] Date of Birth/Sex: 09/17/1951 (66 y.o. Female) Treating RN: Curtis Sites Primary Care Masiyah Engen: Jarome Matin Other Clinician: Referring Tarrin Menn: Jarome Matin Treating Olubunmi Rothenberger/Extender: Rudene Re in Treatment: 2 Encounter Discharge Information Items Discharge Pain Level: 0 Discharge Condition: Stable Ambulatory Status: Cane Discharge Destination: Home Transportation: Private  Auto Accompanied By: self Schedule Follow-up Appointment: Yes Medication Reconciliation completed and Yes provided to Patient/Care Kawika Bischoff: Provided on Clinical Summary of Care: 08/10/2017 Form Type Recipient Paper Patient CS Electronic Signature(s) Signed: 08/15/2017 8:35:27 AM By: Gwenlyn Perking Entered By: Gwenlyn Perking on 08/10/2017 10:19:30 [name] ([id number]) -------------------------------------------------------------------------------- Lower Extremity Assessment Details Patient Name: [name] Date of Service: 08/10/2017 9:30 AM Medical Record Number: [id number] Patient Account Number: [id number] Date of Birth/Sex: 07-20-1951 (66 y.o. Female) Treating RN: Curtis Sites Primary Care Jaymeson Mengel: Jarome Matin Other Clinician: Referring Jasma Seevers: Jarome Matin Treating Natalia Wittmeyer/Extender: Rudene Re in Treatment: 2 Vascular Assessment Pulses: Dorsalis Pedis Palpable: [Left:Yes] [Right:Yes] Posterior Tibial Extremity colors, hair growth, and conditions: Extremity Color: [Left:Normal] Hair Growth on Extremity: [Left:Yes] Temperature of Extremity: [Left:Warm] Capillary Refill: [Left:< 3 seconds] Electronic Signature(s) Signed: 08/11/2017 5:02:13 PM By: Curtis Sites Entered By: Curtis Sites on 08/10/2017 09:40:28 [name] ([id number]) -------------------------------------------------------------------------------- Multi Wound Chart Details Patient Name: [name] Date of Service: 08/10/2017 9:30 AM Medical Record Number: [id number] Patient Account Number: [id number] Date of Birth/Sex: [birth year] (66 y.o. Female) Treating RN: Curtis Sites Primary Care Chirag Krueger: Jarome Matin Other Clinician: Referring Kalid Ghan: Jarome Matin Treating Caston Coopersmith/Extender: Rudene Re in Treatment: 2 Vital Signs Height(in): 64 Pulse(bpm): 70 Weight(lbs): 262 Blood Pressure(mmHg): 115/56 Body Mass Index(BMI):  45 Temperature(F): 98.3 Respiratory Rate 18 (breaths/min): Photos: [1:No Photos] [2:No Photos] [N/A:N/A] Wound Location: [1:Left Metatarsal head first - Plantar] [2:Left Metatarsal head fifth - Plantar] [N/A:N/A] Wounding Event: [1:Gradually Appeared] [2:Trauma] [N/A:N/A] 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] [2:Neuropathic Ulcer-Non Diabetic] [N/A:N/A] Comorbid History: [1:Arrhythmia, Hypertension, Neuropathy] [2:Arrhythmia, Hypertension, Neuropathy] [N/A:N/A] Date Acquired: [1:01/22/2017] [2:05/27/2017] [N/A:N/A] Weeks of Treatment: [1:2] [2:2] [N/A:N/A] Wound Status: [1:Open] [2:Open] [N/A:N/A] Measurements L x W x D [1:0.7x0.7x0.5] [2:1x1x0.1] [N/A:N/A] (cm) Area (cm) : [1:0.385] [2:0.785] [N/A:N/A] Volume (cm) : [1:0.192] [2:0.079] [N/A:N/A] % Reduction in Area: [1:-205.60%] [2:23.10%] [N/A:N/A] % Reduction in Volume: [1:-204.80%] [2:22.50%] [N/A:N/A] Classification: [1:Full Thickness With Exposed Support Structures] [2:Full Thickness With Exposed Support Structures] [N/A:N/A] Exudate Amount: [1:Large] [2:Large] [N/A:N/A] Exudate Type: [1:Serosanguineous] [  2:Serosanguineous] [N/A:N/A] Exudate Color: [1:red, brown] [2:red, brown] [N/A:N/A] Wound Margin: [1:Distinct, outline attached] [2:Distinct, outline attached] [N/A:N/A] Granulation Amount: [1:Medium (34-66%)] [2:Large (67-100%)] [N/A:N/A] Granulation Quality: [1:Red] [2:Red, Hyper-granulation] [N/A:N/A] Necrotic Amount: [1:Medium (34-66%)] [2:Small (1-33%)] [N/A:N/A] Necrotic Tissue: [1:Eschar, Adherent Slough] [2:Adherent Slough] [N/A:N/A] Exposed Structures: [1:Fat Layer (Subcutaneous Tissue) Exposed: Yes Fascia: No Tendon: No Muscle: No Joint: No Bone: No] [2:Fat Layer (Subcutaneous Tissue) Exposed: Yes Fascia: No Tendon: No Muscle: No Joint: No Bone: No] [N/A:N/A] Epithelialization: [1:None] [2:None] [N/A:N/A] Debridement: [1:Debridement ([id number](11042-11047) 09:57] [2:Debridement ([id number](11042-11047) 09:57] [N/A:N/A  N/A] Pre-procedure Verification/Time Out Taken: Pain Control: Lidocaine 4% Topical Solution Lidocaine 4% Topical Solution N/A Tissue Debrided: Fibrin/Slough, Callus, Fibrin/Slough, Callus, N/A Subcutaneous Subcutaneous Level: Skin/Subcutaneous Tissue Skin/Subcutaneous Tissue N/A Debridement Area (sq cm): 0.49 1 N/A Instrument: Curette Curette N/A Bleeding: Minimum Minimum N/A Hemostasis Achieved: Pressure Pressure N/A Procedural Pain: 0 0 N/A Post Procedural Pain: 0 0 N/A Debridement Treatment Procedure was tolerated well Procedure was tolerated well N/A Response: Post Debridement 0.8x0.8x0.5 1x1x0.2 N/A Measurements L x W x D (cm) Post Debridement Volume: 0.251 0.157 N/A (cm) Periwound Skin Texture: Callus: Yes Callus: Yes N/A Excoriation: No Excoriation: No Induration: No Induration: No Crepitus: No Crepitus: No Rash: No Rash: No Scarring: No Scarring: No Periwound Skin Moisture: Maceration: No Maceration: No N/A Dry/Scaly: No Dry/Scaly: No Periwound Skin Color: Atrophie Blanche: No Atrophie Blanche: No N/A Cyanosis: No Cyanosis: No Ecchymosis: No Ecchymosis: No Erythema: No Erythema: No Hemosiderin Staining: No Hemosiderin Staining: No Mottled: No Mottled: No Pallor: No Pallor: No Rubor: No Rubor: No Temperature: No Abnormality No Abnormality N/A Tenderness on Palpation: Yes Yes N/A Wound Preparation: Ulcer Cleansing: Ulcer Cleansing: N/A Rinsed/Irrigated with Saline Rinsed/Irrigated with Saline Topical Anesthetic Applied: Topical Anesthetic Applied: Other: lidocaine 4% Other: lidocaine 4% Procedures Performed: Debridement Debridement N/A Treatment Notes Electronic Signature(s) Signed: 08/10/2017 10:14:05 AM By: Evlyn KannerBritto, Errol MD, FACS Entered By: Evlyn KannerBritto, Errol on 08/10/2017 10:14:04 [name], Yvette I. ([id number]) -------------------------------------------------------------------------------- Multi-Disciplinary Care Plan Details Patient  Name: [name], Yvette I. Date of Service: 08/10/2017 9:30 AM Medical Record Number: [id number] Patient Account Number: [id number] Date of Birth/Sex: 1950-11-06 (66 y.o. Female) Treating RN: Curtis Sitesorthy, Joanna Primary Care Lafreda Casebeer: Jarome MatinPATERSON, DANIEL Other Clinician: Referring Shenicka Sunderlin: Jarome MatinPATERSON, DANIEL Treating Joniyah Mallinger/Extender: Rudene ReBritto, Errol Weeks in Treatment: 2 Active Inactive ` Abuse / Safety / Falls / Self Care Management Nursing Diagnoses: Potential for falls Goals: Patient will not experience any injury related to falls Date Initiated: 07/24/2017 Target Resolution Date: 09/30/2017 Goal Status: Active Interventions: Assess fall risk on admission and as needed Notes: ` Orientation to the Wound Care Program Nursing Diagnoses: Knowledge deficit related to the wound healing center program Goals: Patient/caregiver will verbalize understanding of the Wound Healing Center Program Date Initiated: 07/24/2017 Target Resolution Date: 09/30/2017 Goal Status: Active Interventions: Provide education on orientation to the wound center Notes: ` Wound/Skin Impairment Nursing Diagnoses: Impaired tissue integrity Goals: Ulcer/skin breakdown will heal within 14 weeks Date Initiated: 07/24/2017 Target Resolution Date: 09/30/2017 Goal Status: Active Interventions: Cato MulliganSUTPHEN, Yvette IMarland Kitchen. ([id number]) Assess patient/caregiver ability to obtain necessary supplies Assess patient/caregiver ability to perform ulcer/skin care regimen upon admission and as needed Assess ulceration(s) every visit Notes: Electronic Signature(s) Signed: 08/11/2017 5:02:13 PM By: Curtis Sitesorthy, Joanna Entered By: Curtis Sitesorthy, Joanna on 08/10/2017 09:40:34 [name], Labella I. ([id number]) -------------------------------------------------------------------------------- Pain Assessment Details Patient Name: [name], Lisamarie I. Date of Service: 08/10/2017 9:30 AM Medical Record Number: [id number] Patient Account Number: [id number] Date  of Birth/Sex: 1950-11-06 (66 y.o. Female) Treating RN: Curtis Sitesorthy, Joanna Primary Care Aharon Carriere: Jarome MatinPATERSON, DANIEL  Other Clinician: Referring Phinley Schall: Jarome Matin Treating Stokes Rattigan/Extender: Rudene Re in Treatment: 2 Active Problems Location of Pain Severity and Description of Pain Patient Has Paino No Site Locations Pain Management and Medication Current Pain Management: Electronic Signature(s) Signed: 08/11/2017 5:02:13 PM By: Curtis Sites Entered By: Curtis Sites on 08/10/2017 09:33:29 [name] ([id number]) -------------------------------------------------------------------------------- Patient/Caregiver Education Details Patient Name: [name] Date of Service: 08/10/2017 9:30 AM Medical Record Number: [id number] Patient Account Number: [id number] Date of Birth/Gender: 1951/09/02 (66 y.o. Female) Treating RN: Curtis Sites Primary Care Physician: Jarome Matin Other Clinician: Referring Physician: Jarome Matin Treating Physician/Extender: Rudene Re in Treatment: 2 Education Assessment Education Provided To: Patient Education Topics Provided Wound/Skin Impairment: Handouts: Caring for Your Ulcer Methods: Demonstration, Explain/Verbal Responses: State content correctly Electronic Signature(s) Signed: 08/11/2017 5:02:13 PM By: Curtis Sites Entered By: Curtis Sites on 08/10/2017 10:16:13 [name] ([id number]) -------------------------------------------------------------------------------- Wound Assessment Details Patient Name: [name] Date of Service: 08/10/2017 9:30 AM Medical Record Number: [id number] Patient Account Number: [id number] Date of Birth/Sex: [birth year] (66 y.o. Female) Treating RN: Curtis Sites Primary Care Odena Mcquaid: Jarome Matin Other Clinician: Referring Reagen Haberman: Jarome Matin Treating Jakobie Henslee/Extender: Rudene Re in Treatment: 2 Wound Status Wound Number:  1 Primary Etiology: Neuropathic Ulcer-Non Diabetic Wound Location: Left Metatarsal head first - Plantar Wound Status: Open Wounding Event: Gradually Appeared Comorbid History: Arrhythmia, Hypertension, Neuropathy Date Acquired: 01/22/2017 Weeks Of Treatment: 2 Clustered Wound: No Photos Photo Uploaded By: Curtis Sites on 08/11/2017 14:29:03 Wound Measurements Length: (cm) 0.7 Width: (cm) 0.7 Depth: (cm) 0.5 Area: (cm) 0.385 Volume: (cm) 0.192 % Reduction in Area: -205.6% % Reduction in Volume: -204.8% Epithelialization: None Tunneling: No Undermining: No Wound Description Full Thickness With Exposed Support Classification: Structures Wound Margin: Distinct, outline attached Exudate Large Amount: Exudate Type: Serosanguineous Exudate Color: red, brown Foul Odor After Cleansing: No Slough/Fibrino Yes Wound Bed Granulation Amount: Medium (34-66%) Exposed Structure Granulation Quality: Red Fascia Exposed: No Necrotic Amount: Medium (34-66%) Fat Layer (Subcutaneous Tissue) Exposed: Yes Necrotic Quality: Eschar, Adherent Slough Tendon Exposed: No Muscle Exposed: No Joint Exposed: No Bone Exposed: No Fagin, Toinette I. ([id number]) Periwound Skin Texture Texture Color No Abnormalities Noted: No No Abnormalities Noted: No Callus: Yes Atrophie Blanche: No Crepitus: No Cyanosis: No Excoriation: No Ecchymosis: No Induration: No Erythema: No Rash: No Hemosiderin Staining: No Scarring: No Mottled: No Pallor: No Moisture Rubor: No No Abnormalities Noted: No Dry / Scaly: No Temperature / Pain Maceration: No Temperature: No Abnormality Tenderness on Palpation: Yes Wound Preparation Ulcer Cleansing: Rinsed/Irrigated with Saline Topical Anesthetic Applied: Other: lidocaine 4%, Electronic Signature(s) Signed: 08/11/2017 5:02:13 PM By: Curtis Sites Entered By: Curtis Sites on 08/10/2017 09:39:38 [name]  ([id number]) -------------------------------------------------------------------------------- Wound Assessment Details Patient Name: [name] Date of Service: 08/10/2017 9:30 AM Medical Record Number: [id number] Patient Account Number: [id number] Date of Birth/Sex: 11/11/1950 (66 y.o. Female) Treating RN: Curtis Sites Primary Care Izola Teague: Jarome Matin Other Clinician: Referring Erik Nessel: Jarome Matin Treating Rebecca Cairns/Extender: Rudene Re in Treatment: 2 Wound Status Wound Number: 2 Primary Etiology: Neuropathic Ulcer-Non Diabetic Wound Location: Left Metatarsal head fifth - Plantar Wound Status: Open Wounding Event: Trauma Comorbid History: Arrhythmia, Hypertension, Neuropathy Date Acquired: 05/27/2017 Weeks Of Treatment: 2 Clustered Wound: No Photos Photo Uploaded By: Curtis Sites on 08/11/2017 14:29:04 Wound Measurements Length: (cm) 1 Width: (cm) 1 Depth: (cm) 0.1 Area: (cm) 0.785 Volume: (cm) 0.079 % Reduction in Area: 23.1% % Reduction in Volume: 22.5% Epithelialization: None Tunneling: No Undermining: No Wound Description Full  Thickness With Exposed Support Classification: Structures Wound Margin: Distinct, outline attached Exudate Large Amount: Exudate Type: Serosanguineous Exudate Color: red, brown Foul Odor After Cleansing: No Slough/Fibrino Yes Wound Bed Granulation Amount: Large (67-100%) Exposed Structure Granulation Quality: Red, Hyper-granulation Fascia Exposed: No Necrotic Amount: Small (1-33%) Fat Layer (Subcutaneous Tissue) Exposed: Yes Necrotic Quality: Adherent Slough Tendon Exposed: No Muscle Exposed: No Joint Exposed: No Bone Exposed: No Glazebrook, Omega I. ([id number]) Periwound Skin Texture Texture Color No Abnormalities Noted: No No Abnormalities Noted: No Callus: Yes Atrophie Blanche: No Crepitus: No Cyanosis: No Excoriation: No Ecchymosis: No Induration: No Erythema: No Rash:  No Hemosiderin Staining: No Scarring: No Mottled: No Pallor: No Moisture Rubor: No No Abnormalities Noted: No Dry / Scaly: No Temperature / Pain Maceration: No Temperature: No Abnormality Tenderness on Palpation: Yes Wound Preparation Ulcer Cleansing: Rinsed/Irrigated with Saline Topical Anesthetic Applied: Other: lidocaine 4%, Electronic Signature(s) Signed: 08/11/2017 5:02:13 PM By: Curtis Sitesorthy, Joanna Entered By: Curtis Sitesorthy, Joanna on 08/10/2017 09:39:52 [name], Pierina I. ([id number]) -------------------------------------------------------------------------------- Vitals Details Patient Name: [name], Tabithia I. Date of Service: 08/10/2017 9:30 AM Medical Record Number: [id number] Patient Account Number: [id number] Date of Birth/Sex: 1951/05/04 (66 y.o. Female) Treating RN: Curtis Sitesorthy, Joanna Primary Care Sam Overbeck: Jarome MatinPATERSON, DANIEL Other Clinician: Referring Etheridge Geil: Jarome MatinPATERSON, DANIEL Treating Liem Copenhaver/Extender: Rudene ReBritto, Errol Weeks in Treatment: 2 Vital Signs Time Taken: 09:33 Temperature (F): 98.3 Height (in): 64 Pulse (bpm): 70 Weight (lbs): 262 Respiratory Rate (breaths/min): 18 Body Mass Index (BMI): 45 Blood Pressure (mmHg): 115/56 Reference Range: 80 - 120 mg / dl Electronic Signature(s) Signed: 08/11/2017 5:02:13 PM By: Curtis Sitesorthy, Joanna Entered By: Curtis Sitesorthy, Joanna on 08/10/2017 09:35:24

## 2017-08-15 NOTE — Progress Notes (Signed)
[name], [name] ([id number]) Visit Report for 08/14/2017 Arrival Information Details Patient Name: KRYSTIANNA, SOTH Date of Service: 08/14/2017 11:00 AM Medical Record Number: [id number] Patient Account Number: [id number] Date of Birth/Sex: Jan 27, 1951 (66 y.o. Female) Treating RN: Curtis Sites Primary Care Cameryn Schum: Jarome Matin Other Clinician: Referring Kashtyn Jankowski: Jarome Matin Treating Preeya Cleckley/Extender: Rudene Re in Treatment: 3 Visit Information History Since Last Visit Added or deleted any medications: No Patient Arrived: Cane Any new allergies or adverse reactions: No Arrival Time: 10:56 Had a fall or experienced change in No Accompanied By: self activities of daily living that may affect Transfer Assistance: None risk of falls: Patient Identification Verified: Yes Signs or symptoms of abuse/neglect since last visito No Secondary Verification Process Yes Hospitalized since last visit: No Completed: Has Dressing in Place as Prescribed: Yes Patient Has Alerts: Yes Pain Present Now: No Patient Alerts: Patient on Blood Thinner Eliquis Electronic Signature(s) Signed: 08/14/2017 4:43:33 PM By: Curtis Sites Entered By: Curtis Sites on 08/14/2017 10:57:11 [name] ([id number]) -------------------------------------------------------------------------------- Encounter Discharge Information Details Patient Name: [name] Date of Service: 08/14/2017 11:00 AM Medical Record Number: [id number] Patient Account Number: [id number] Date of Birth/Sex: 11/01/1950 (66 y.o. Female) Treating RN: Curtis Sites Primary Care Kasey Ewings: Jarome Matin Other Clinician: Referring Magdalena Skilton: Jarome Matin Treating Lugenia Assefa/Extender: Rudene Re in Treatment: 3 Encounter Discharge Information Items Discharge Pain Level: 0 Discharge Condition: Stable Ambulatory Status: Cane Discharge Destination: Home Transportation: Private  Auto Accompanied By: self Schedule Follow-up Appointment: Yes Medication Reconciliation completed and No provided to Patient/Care Minela [place]: Provided on Clinical Summary of Care: 08/14/2017 Form Type Recipient Paper Patient CS Electronic Signature(s) Signed: 08/14/2017 11:54:07 AM By: Curtis Sites Entered By: Curtis Sites on 08/14/2017 11:54:07 [name] ([id number]) -------------------------------------------------------------------------------- Lower Extremity Assessment Details Patient Name: [name] Date of Service: 08/14/2017 11:00 AM Medical Record Number: [id number] Patient Account Number: [id number] Date of Birth/Sex: 1951/04/09 (66 y.o. Female) Treating RN: Curtis Sites Primary Care Ash Mcelwain: Jarome Matin Other Clinician: Referring Nyaira Hodgens: Jarome Matin Treating Macklen Wilhoite/Extender: Rudene Re in Treatment: 3 Vascular Assessment Pulses: Dorsalis Pedis Palpable: [Left:Yes] Posterior Tibial Extremity colors, hair growth, and conditions: Extremity Color: [Left:Normal] Hair Growth on Extremity: [Left:No] Temperature of Extremity: [Left:Warm] Capillary Refill: [Left:< 3 seconds] Electronic Signature(s) Signed: 08/14/2017 4:43:33 PM By: Curtis Sites Entered By: Curtis Sites on 08/14/2017 11:06:32 [name] ([id number]) -------------------------------------------------------------------------------- Multi Wound Chart Details Patient Name: [name] Date of Service: 08/14/2017 11:00 AM Medical Record Number: [id number] Patient Account Number: [id number] Date of Birth/Sex: 01-09-1951 (66 y.o. 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] [2:Neuropathic Ulcer-Non Diabetic] [N/A:N/A] Comorbid History: [1:Arrhythmia, Hypertension, Neuropathy] [2:Arrhythmia, Hypertension, Neuropathy] [N/A:N/A] Date Acquired: [1:01/22/2017] [2:05/27/2017] [N/A:N/A] Weeks of Treatment: [1:3] [2:3] [N/A:N/A] Wound Status: [1:Open] [2:Open] [N/A:N/A] Measurements L x W x D [1:0.5x0.4x0.4] [2:0.9x1.1x0.1] [N/A:N/A] (cm) Area (cm) : [1:0.157] [2:0.778] [N/A:N/A] Volume (cm) : [1:0.063] [2:0.078] [N/A:N/A] % Reduction in Area: [1:-24.60%] [2:23.80%] [N/A:N/A] % Reduction in Volume: [1:0.00%] [2:23.50%] [N/A:N/A] Classification: [1:Full Thickness With Exposed Support Structures] [2:Full Thickness With Exposed Support Structures] [N/A:N/A] Exudate Amount: [1:Large] [2:Large] [N/A:N/A] Exudate Type: [1:Serosanguineous] [2:Serosanguineous] [  N/A:N/A] Exudate Color: [1:red, brown] [2:red, brown] [N/A:N/A] Wound Margin: [1:Distinct, outline attached] [2:Distinct, outline attached] [N/A:N/A] Granulation Amount: [1:Medium (34-66%)] [2:Large (67-100%)] [N/A:N/A] Granulation Quality: [1:Red] [2:Red, Hyper-granulation] [N/A:N/A] Necrotic Amount: [1:Medium (34-66%)] [2:Small (1-33%)] [N/A:N/A] Necrotic Tissue: [1:Eschar, Adherent Slough] [2:Adherent Slough] [N/A:N/A] Exposed Structures: [1:Fat Layer (Subcutaneous Tissue) Exposed: Yes Fascia: No Tendon: No Muscle: No Joint: No Bone: No] [2:Fat Layer (Subcutaneous Tissue) Exposed: Yes Fascia: No Tendon: No Muscle: No Joint: No Bone: No] [N/A:N/A] Epithelialization: [1:None] [2:None] [N/A:N/A] Debridement: [1:Debridement ([id number]) 11:12] [2:Debridement ([id number]) 11:14] [N/A:N/A  N/A] Pre-procedure Verification/Time Out Taken: Pain Control: Lidocaine 4% Topical Solution Lidocaine 4% Topical Solution N/A Tissue Debrided: Fibrin/Slough, Callus, Fibrin/Slough, Callus, N/A Subcutaneous Subcutaneous Level: Skin/Subcutaneous Tissue Skin/Subcutaneous Tissue N/A Debridement Area (sq cm): 0.2 0.99 N/A Instrument: Curette Curette N/A Bleeding: Minimum Minimum N/A Hemostasis Achieved: Pressure Pressure N/A Procedural Pain: 0 0 N/A Post Procedural Pain: 0 0 N/A Debridement Treatment Procedure was tolerated well Procedure was tolerated well N/A Response: Post Debridement 0.7x0.8x0.4 0.9x1.1x0.2 N/A Measurements L x W x D (cm) Post Debridement Volume: 0.176 0.156 N/A (cm) Periwound Skin Texture: Callus: Yes Callus: Yes N/A Excoriation: No Excoriation: No Induration: No Induration: No Crepitus: No Crepitus: No Rash: No Rash: No Scarring: No Scarring: No Periwound Skin Moisture: Maceration: No Maceration: No N/A Dry/Scaly: No Dry/Scaly: No Periwound Skin Color: Atrophie Blanche: No Atrophie Blanche: No N/A Cyanosis: No Cyanosis: No Ecchymosis: No Ecchymosis: No Erythema: No Erythema: No Hemosiderin Staining: No Hemosiderin Staining: No Mottled: No Mottled: No Pallor: No Pallor: No Rubor: No Rubor: No Temperature: No Abnormality No Abnormality N/A Tenderness on Palpation: Yes Yes N/A Wound Preparation: Ulcer Cleansing: Ulcer Cleansing: N/A Rinsed/Irrigated with Saline Rinsed/Irrigated with Saline Topical Anesthetic Applied: Topical Anesthetic Applied: Other: lidocaine 4% Other: lidocaine 4% Procedures Performed: Debridement Debridement N/A Treatment Notes Electronic Signature(s) Signed: 08/14/2017 11:21:03 AM By: Evlyn Kanner MD, FACS Entered By: Evlyn Kanner on 08/14/2017 11:21:03 [name] ([id number]) -------------------------------------------------------------------------------- Multi-Disciplinary Care Plan  Details Patient Name: [name] Date of Service: 08/14/2017 11:00 AM Medical Record Number: [id number] Patient Account Number: [id number] Date of Birth/Sex: Aug 15, 1951 (66 y.o. Female) Treating RN: Curtis Sites Primary Care Balthazar Dooly: Jarome Matin Other Clinician: Referring Adra Shepler: Jarome Matin Treating Diasha Castleman/Extender: Rudene Re in Treatment: 3 Active Inactive ` Abuse / Safety / Falls / Self Care Management Nursing Diagnoses: Potential for falls Goals: Patient will not experience any injury related to falls Date Initiated: 07/24/2017 Target Resolution Date: 09/30/2017 Goal Status: Active Interventions: Assess fall risk on admission and as needed Notes: ` Orientation to the Wound Care Program Nursing Diagnoses: Knowledge deficit related to the wound healing center program Goals: Patient/caregiver will verbalize understanding of the Wound Healing Center Program Date Initiated: 07/24/2017 Target Resolution Date: 09/30/2017 Goal Status: Active Interventions: Provide education on orientation to the wound center Notes: ` Wound/Skin Impairment Nursing Diagnoses: Impaired tissue integrity Goals: Ulcer/skin breakdown will heal within 14 weeks Date Initiated: 07/24/2017 Target Resolution Date: 09/30/2017 Goal Status: Active Interventions: ROSEALYN, LITTLE IMarland Kitchen ([id number]) Assess patient/caregiver ability to obtain necessary supplies Assess patient/caregiver ability to perform ulcer/skin care regimen upon admission and as needed Assess ulceration(s) every visit Notes: Electronic Signature(s) Signed: 08/14/2017 4:43:33 PM By: Curtis Sites Entered By: Curtis Sites on 08/14/2017 11:10:04 [name] ([id number]) -------------------------------------------------------------------------------- Pain Assessment Details Patient Name: [name] Date of Service: 08/14/2017 11:00 AM Medical Record Number: [id number] Patient Account  Number: [id number] Date of Birth/Sex: [birth year] (66 y.o. Female) Treating RN: Curtis Sites Primary Care Enis Leatherwood: Jarome Matin Other  Clinician: Referring Khristine Verno: Jarome MatinPATERSON, DANIEL Treating Tedi Hughson/Extender: Rudene ReBritto, Errol Weeks in Treatment: 3 Active Problems Location of Pain Severity and Description of Pain Patient Has Paino No Site Locations Pain Management and Medication Current Pain Management: Electronic Signature(s) Signed: 08/14/2017 4:43:33 PM By: Curtis Sitesorthy, Joanna Entered By: Curtis Sitesorthy, Joanna on 08/14/2017 10:58:03 Yvette ShutterSUTPHEN, [name]. ([id number]) -------------------------------------------------------------------------------- Patient/Caregiver Education Details Patient Name: Yvette ShutterSUTPHEN, [name]. Date of Service: 08/14/2017 11:00 AM Medical Record Number: [id number] Patient Account Number: [id number] Date of Birth/Gender: 05-12-1951 (66 y.o. Female) Treating RN: Curtis Sitesorthy, Joanna Primary Care Physician: Jarome MatinPATERSON, DANIEL Other Clinician: Referring Physician: Jarome MatinPATERSON, DANIEL Treating Physician/Extender: Rudene ReBritto, Errol Weeks in Treatment: 3 Education Assessment Education Provided To: Patient Education Topics Provided Wound/Skin Impairment: Handouts: Other: wound care as ordered Methods: Demonstration, Explain/Verbal Responses: State content correctly Electronic Signature(s) Signed: 08/14/2017 4:43:33 PM By: Curtis Sitesorthy, Joanna Entered By: Curtis Sitesorthy, Joanna on 08/14/2017 11:54:31 Yvette ShutterSUTPHEN, [name]. ([id number]) -------------------------------------------------------------------------------- Wound Assessment Details Patient Name: Yvette ShutterSUTPHEN, [name]. Date of Service: 08/14/2017 11:00 AM Medical Record Number: [id number] Patient Account Number: [id number] Date of Birth/Sex: 05-12-1951 (66 y.o. Female) Treating RN: Curtis Sitesorthy, Joanna Primary Care Johnpaul Gillentine: Jarome MatinPATERSON, DANIEL Other Clinician: Referring Judas Mohammad: Jarome MatinPATERSON, DANIEL Treating Jamiah Homeyer/Extender: Rudene ReBritto, Errol Weeks in  Treatment: 3 Wound Status Wound Number: 1 Primary Etiology: Neuropathic Ulcer-Non Diabetic Wound Location: Left Metatarsal head first - Plantar Wound Status: Open Wounding Event: Gradually Appeared Comorbid History: Arrhythmia, Hypertension, Neuropathy Date Acquired: 01/22/2017 Weeks Of Treatment: 3 Clustered Wound: No Photos Photo Uploaded By: Curtis Sitesorthy, Joanna on 08/14/2017 13:42:46 Wound Measurements Length: (cm) 0.5 Width: (cm) 0.4 Depth: (cm) 0.4 Area: (cm) 0.157 Volume: (cm) 0.063 % Reduction in Area: -24.6% % Reduction in Volume: 0% Epithelialization: None Tunneling: No Undermining: No Wound Description Full Thickness With Exposed Support Classification: Structures Wound Margin: Distinct, outline attached Exudate Large Amount: Exudate Type: Serosanguineous Exudate Color: red, brown Foul Odor After Cleansing: No Slough/Fibrino Yes Wound Bed Granulation Amount: Medium (34-66%) Exposed Structure Granulation Quality: Red Fascia Exposed: No Necrotic Amount: Medium (34-66%) Fat Layer (Subcutaneous Tissue) Exposed: Yes Necrotic Quality: Eschar, Adherent Slough Tendon Exposed: No Muscle Exposed: No Joint Exposed: No Bone Exposed: No Frandsen, Jaylyne Orozco. ([id number]) Periwound Skin Texture Texture Color No Abnormalities Noted: No No Abnormalities Noted: No Callus: Yes Atrophie Blanche: No Crepitus: No Cyanosis: No Excoriation: No Ecchymosis: No Induration: No Erythema: No Rash: No Hemosiderin Staining: No Scarring: No Mottled: No Pallor: No Moisture Rubor: No No Abnormalities Noted: No Dry / Scaly: No Temperature / Pain Maceration: No Temperature: No Abnormality Tenderness on Palpation: Yes Wound Preparation Ulcer Cleansing: Rinsed/Irrigated with Saline Topical Anesthetic Applied: Other: lidocaine 4%, Treatment Notes Wound #1 (Left, Plantar Metatarsal head first) 1. Cleansed with: Clean wound with Normal Saline 2. Anesthetic Topical  Lidocaine 4% cream to wound bed prior to debridement 4. Dressing Applied: Other dressing (specify in notes) 5. Secondary Dressing Applied Gauze and Kerlix/Conform 6. Footwear/Offloading device applied Felt/Foam 7. Secured with Tape Notes silvercel Electronic Signature(s) Signed: 08/14/2017 4:43:33 PM By: Curtis Sitesorthy, Joanna Entered By: Curtis Sitesorthy, Joanna on 08/14/2017 11:06:03 Yvette ShutterSUTPHEN, [name]. ([id number]) -------------------------------------------------------------------------------- Wound Assessment Details Patient Name: Yvette ShutterSUTPHEN, [name]. Date of Service: 08/14/2017 11:00 AM Medical Record Number: [id number] Patient Account Number: [id number] Date of Birth/Sex: 05-12-1951 (66 y.o. Female) Treating RN: Curtis Sitesorthy, Joanna Primary Care Talya Quain: Jarome MatinPATERSON, DANIEL Other Clinician: Referring Lawrnce Reyez: Jarome MatinPATERSON, DANIEL Treating Savvy Peeters/Extender: Rudene ReBritto, Errol Weeks in Treatment: 3 Wound Status Wound Number: 2 Primary Etiology: Neuropathic Ulcer-Non Diabetic Wound Location: Left Metatarsal head fifth - Plantar Wound Status: Open Wounding Event: Trauma Comorbid History: Arrhythmia, Hypertension, Neuropathy  Date Acquired: 05/27/2017 Weeks Of Treatment: 3 Clustered Wound: No Photos Photo Uploaded By: Curtis Sitesorthy, Joanna on 08/14/2017 13:43:06 Wound Measurements Length: (cm) 0.9 Width: (cm) 1.1 Depth: (cm) 0.1 Area: (cm) 0.778 Volume: (cm) 0.078 % Reduction in Area: 23.8% % Reduction in Volume: 23.5% Epithelialization: None Tunneling: No Undermining: No Wound Description Full Thickness With Exposed Support Classification: Structures Wound Margin: Distinct, outline attached Exudate Large Amount: Exudate Type: Serosanguineous Exudate Color: red, brown Foul Odor After Cleansing: No Slough/Fibrino Yes Wound Bed Granulation Amount: Large (67-100%) Exposed Structure Granulation Quality: Red, Hyper-granulation Fascia Exposed: No Necrotic Amount: Small (1-33%) Fat Layer  (Subcutaneous Tissue) Exposed: Yes Necrotic Quality: Adherent Slough Tendon Exposed: No Muscle Exposed: No Joint Exposed: No Bone Exposed: No Ace, Darleth Orozco. ([id number]) Periwound Skin Texture Texture Color No Abnormalities Noted: No No Abnormalities Noted: No Callus: Yes Atrophie Blanche: No Crepitus: No Cyanosis: No Excoriation: No Ecchymosis: No Induration: No Erythema: No Rash: No Hemosiderin Staining: No Scarring: No Mottled: No Pallor: No Moisture Rubor: No No Abnormalities Noted: No Dry / Scaly: No Temperature / Pain Maceration: No Temperature: No Abnormality Tenderness on Palpation: Yes Wound Preparation Ulcer Cleansing: Rinsed/Irrigated with Saline Topical Anesthetic Applied: Other: lidocaine 4%, Treatment Notes Wound #2 (Left, Plantar Metatarsal head fifth) 1. Cleansed with: Clean wound with Normal Saline 2. Anesthetic Topical Lidocaine 4% cream to wound bed prior to debridement 4. Dressing Applied: Other dressing (specify in notes) 5. Secondary Dressing Applied Gauze and Kerlix/Conform 6. Footwear/Offloading device applied Felt/Foam 7. Secured with Tape Notes silvercel Electronic Signature(s) Signed: 08/14/2017 4:43:33 PM By: Curtis Sitesorthy, Joanna Entered By: Curtis Sitesorthy, Joanna on 08/14/2017 11:06:13 Yvette ShutterSUTPHEN, [name]. ([id number]) -------------------------------------------------------------------------------- Vitals Details Patient Name: Yvette ShutterSUTPHEN, Kamry Orozco. Date of Service: 08/14/2017 11:00 AM Medical Record Number: [id number] Patient Account Number: [id number] Date of Birth/Sex: 02-Nov-1950 (66 y.o. Female) Treating RN: Curtis Sitesorthy, Joanna Primary Care Eliceo Gladu: Jarome MatinPATERSON, DANIEL Other Clinician: Referring Cleopatra Sardo: Jarome MatinPATERSON, DANIEL Treating Ceaira Ernster/Extender: Rudene ReBritto, Errol Weeks in Treatment: 3 Vital Signs Time Taken: 10:58 Temperature (F): 97.8 Height (in): 64 Pulse (bpm): 69 Weight (lbs): 262 Respiratory Rate (breaths/min): 18 Body Mass  Index (BMI): 45 Blood Pressure (mmHg): 147/60 Reference Range: 80 - 120 mg / dl Electronic Signature(s) Signed: 08/14/2017 4:43:33 PM By: Curtis Sitesorthy, Joanna Entered By: Curtis Sitesorthy, Joanna on 08/14/2017 11:00:18

## 2017-08-19 ENCOUNTER — Other Ambulatory Visit: Payer: Self-pay | Admitting: Nurse Practitioner

## 2017-08-21 ENCOUNTER — Other Ambulatory Visit: Payer: Self-pay | Admitting: Cardiology

## 2017-08-21 ENCOUNTER — Encounter: Payer: PPO | Admitting: Surgery

## 2017-08-21 DIAGNOSIS — G603 Idiopathic progressive neuropathy: Secondary | ICD-10-CM | POA: Diagnosis not present

## 2017-08-21 DIAGNOSIS — M79641 Pain in right hand: Secondary | ICD-10-CM | POA: Diagnosis not present

## 2017-08-21 DIAGNOSIS — M79642 Pain in left hand: Secondary | ICD-10-CM | POA: Diagnosis not present

## 2017-08-21 DIAGNOSIS — M1811 Unilateral primary osteoarthritis of first carpometacarpal joint, right hand: Secondary | ICD-10-CM | POA: Diagnosis not present

## 2017-08-21 DIAGNOSIS — M180 Bilateral primary osteoarthritis of first carpometacarpal joints: Secondary | ICD-10-CM | POA: Diagnosis not present

## 2017-08-21 DIAGNOSIS — M1812 Unilateral primary osteoarthritis of first carpometacarpal joint, left hand: Secondary | ICD-10-CM | POA: Diagnosis not present

## 2017-08-21 DIAGNOSIS — L97522 Non-pressure chronic ulcer of other part of left foot with fat layer exposed: Secondary | ICD-10-CM | POA: Diagnosis not present

## 2017-08-21 MED ORDER — HYDRALAZINE HCL 50 MG PO TABS: 75.0000 mg | ORAL_TABLET | Freq: Three times a day (TID) | ORAL | 3 refills | Status: DC

## 2017-08-21 NOTE — Progress Notes (Signed)
Yvette, Orozco ([id number]) Visit Report for 08/21/2017 Arrival Information Details Patient Name: Yvette, Orozco Date of Service: 08/21/2017 10:15 AM Medical Record Number: [id number] Patient Account Number: [id number] Date of Birth/Sex: [birth year] (66 y.o. Female) Treating RN: Curtis Sites Primary Care Brittanyann Wittner: Jarome Matin Other Clinician: Referring Donalyn Schneeberger: Jarome Matin Treating Dharma Pare/Extender: Rudene Re in Treatment: 4 Visit Information History Since Last Visit Added or deleted any medications: No Patient Arrived: Cane Any new allergies or adverse reactions: No Arrival Time: 10:31 Had a fall or experienced change in No Accompanied By: self activities of daily living that may affect Transfer Assistance: None risk of falls: Patient Identification Verified: Yes Signs or symptoms of abuse/neglect since last visito No Secondary Verification Process Yes Hospitalized since last visit: No Completed: Has Dressing in Place as Prescribed: Yes Patient Has Alerts: Yes Pain Present Now: No Patient Alerts: Patient on Blood Thinner Eliquis Electronic Signature(s) Signed: 08/21/2017 2:45:49 PM By: Curtis Sites Entered By: Curtis Sites on 08/21/2017 10:31:45 [name] ([id number]) -------------------------------------------------------------------------------- Encounter Discharge Information Details Patient Name: [name] Date of Service: 08/21/2017 10:15 AM Medical Record Number: [id number] Patient Account Number: [id number] Date of Birth/Sex: [birth year] (66 y.o. Female) Treating RN: Curtis Sites Primary Care Lauriel Helin: Jarome Matin Other Clinician: Referring Katoya Amato: Jarome Matin Treating Stephen Baruch/Extender: Rudene Re in Treatment: 4 Encounter Discharge Information Items Discharge Pain Level: 0 Discharge Condition: Stable Ambulatory Status: Ambulatory Discharge Destination:  Home Private Transportation: Auto Accompanied By: self Schedule Follow-up Appointment: Yes Medication Reconciliation completed and provided No to Patient/Care Deshone Lyssy: Clinical Summary of Care: Electronic Signature(s) Signed: 08/21/2017 1:30:15 PM By: Curtis Sites Entered By: Curtis Sites on 08/21/2017 13:30:15 [name] ([id number]) -------------------------------------------------------------------------------- Lower Extremity Assessment Details Patient Name: [name] Date of Service: 08/21/2017 10:15 AM Medical Record Number: [id number] Patient Account Number: [id number] Date of Birth/Sex: 25-Sep-1951 (66 y.o. Female) Treating RN: Curtis Sites Primary Care Amoree Newlon: Jarome Matin Other Clinician: Referring Haig Gerardo: Jarome Matin Treating Maralee Higuchi/Extender: Rudene Re in Treatment: 4 Vascular Assessment Pulses: Dorsalis Pedis Palpable: [Left:Yes] Posterior Tibial Extremity colors, hair growth, and conditions: Extremity Color: [Left:Normal] Hair Growth on Extremity: [Left:Yes] Temperature of Extremity: [Left:Warm] Capillary Refill: [Left:< 3 seconds] Electronic Signature(s) Signed: 08/21/2017 2:45:49 PM By: Curtis Sites Entered By: Curtis Sites on 08/21/2017 10:43:44 [name] ([id number]) -------------------------------------------------------------------------------- Multi Wound Chart Details Patient Name: [name] Date of Service: 08/21/2017 10:15 AM Medical Record Number: [id number] Patient Account Number: [id number] Date of Birth/Sex: [birth year] (66 y.o. 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] [2:Neuropathic Ulcer-Non Diabetic] [N/A:N/A] Comorbid History: [1:Arrhythmia, Hypertension, Neuropathy] [2:Arrhythmia, Hypertension, Neuropathy] [N/A:N/A] Date Acquired: [1:01/22/2017] [2:05/27/2017] [N/A:N/A] Weeks of Treatment: [1:4] [2:4] [N/A:N/A] Wound Status: [1:Open] [2:Open] [N/A:N/A] Measurements L x W x D [1:0.5x0.4x0.3] [2:0.7x1x0.1] [N/A:N/A] (cm) Area (cm) : [1:0.157] [2:0.55] [N/A:N/A] Volume (cm) : [1:0.047] [2:0.055] [N/A:N/A] % Reduction in Area: [1:-24.60%] [2:46.10%] [N/A:N/A] % Reduction in Volume: [1:25.40%] [2:46.10%] [N/A:N/A] Classification: [1:Full Thickness With Exposed Support Structures] [2:Full Thickness With Exposed Support Structures] [N/A:N/A] Exudate Amount: [1:Large] [2:Large] [N/A:N/A] Exudate Type: [1:Serosanguineous] [2:Serosanguineous] [N/A:N/A] Exudate Color: [1:red, brown] [2:red, brown] [N/A:N/A] Wound  Margin: [1:Distinct, outline attached] [2:Distinct, outline attached] [N/A:N/A] Granulation Amount: [1:Large (67-100%)] [2:Large (67-100%)] [N/A:N/A] Granulation Quality: [1:Red] [2:Red, Hyper-granulation] [N/A:N/A] Necrotic Amount: [1:Small (1-33%)] [2:Small (1-33%)] [N/A:N/A] Necrotic Tissue: [1:Eschar, Adherent Slough] [2:Adherent Slough] [N/A:N/A] Exposed Structures: [1:Fat Layer (Subcutaneous Tissue) Exposed: Yes Fascia: No Tendon: No Muscle: No Joint: No Bone: No] [2:Fat Layer (Subcutaneous Tissue) Exposed: Yes Fascia: No Tendon: No Muscle: No Joint: No Bone: No] [N/A:N/A] Epithelialization: [1:None] [2:None] [N/A:N/A] Debridement: [1:Debridement ([id number]) 10:49] [2:Debridement ([id number]) 10:52] [N/A:N/A N/A] Pre-procedure Verification/Time Out  Taken: Pain Control: Lidocaine 4% Topical Solution Lidocaine 4% Topical Solution N/A Tissue Debrided: Fibrin/Slough, Skin, Callus, Fibrin/Slough, Skin, Callus, N/A Subcutaneous Subcutaneous Level: Skin/Subcutaneous Tissue Skin/Subcutaneous Tissue N/A Debridement Area (sq cm): 0.2 0.7 N/A Instrument: Forceps, Scissors Forceps, Scissors N/A Bleeding: Minimum Minimum N/A Hemostasis Achieved: Pressure Pressure N/A Procedural Pain: 0 0 N/A Post Procedural Pain: 0 0 N/A Debridement Treatment Procedure was tolerated well Procedure was tolerated well N/A Response: Post Debridement 0.5x0.4x0.4 0.7x1x0.2 N/A Measurements L x W x D (cm) Post Debridement Volume: 0.063 0.11 N/A (cm) Periwound Skin Texture: Callus: Yes Callus: Yes N/A Excoriation: No Excoriation: No Induration: No Induration: No Crepitus: No Crepitus: No Rash: No Rash: No Scarring: No Scarring: No Periwound Skin Moisture: Maceration: No Maceration: No N/A Dry/Scaly: No Dry/Scaly: No Periwound Skin Color: Atrophie Blanche: No Atrophie Blanche: No N/A Cyanosis: No Cyanosis: No Ecchymosis: No Ecchymosis: No Erythema: No Erythema: No Hemosiderin Staining: No Hemosiderin Staining: No Mottled: No Mottled: No Pallor: No Pallor: No Rubor: No Rubor: No Temperature: No Abnormality No Abnormality N/A Tenderness on Palpation: Yes Yes N/A Wound Preparation: Ulcer Cleansing: Ulcer Cleansing: N/A Rinsed/Irrigated with Saline Rinsed/Irrigated with Saline Topical Anesthetic Applied: Topical Anesthetic Applied: Other: lidocaine 4% Other: lidocaine 4% Procedures Performed: Debridement Debridement N/A Treatment Notes Electronic Signature(s) Signed: 08/21/2017 10:59:24 AM By: Evlyn KannerBritto, Errol MD, FACS Entered By: Evlyn KannerBritto, Errol on 08/21/2017 10:59:23 Yvette ShutterSUTPHEN, Yvette I. ([id number]) -------------------------------------------------------------------------------- Multi-Disciplinary Care Plan Details Patient Name:  Yvette ShutterSUTPHEN, Yvette I. Date of Service: 08/21/2017 10:15 AM Medical Record Number: [id number] Patient Account Number: [id number] Date of Birth/Sex: 02/03/1951 (66 y.o. Female) Treating RN: Curtis Sitesorthy, Joanna Primary Care Jaedon Siler: Jarome MatinPATERSON, DANIEL Other Clinician: Referring Yvetta Drotar: Jarome MatinPATERSON, DANIEL Treating Vallery Mcdade/Extender: Rudene ReBritto, Errol Weeks in Treatment: 4 Active Inactive ` Abuse / Safety / Falls / Self Care Management Nursing Diagnoses: Potential for falls Goals: Patient will not experience any injury related to falls Date Initiated: 07/24/2017 Target Resolution Date: 09/30/2017 Goal Status: Active Interventions: Assess fall risk on admission and as needed Notes: ` Orientation to the Wound Care Program Nursing Diagnoses: Knowledge deficit related to the wound healing center program Goals: Patient/caregiver will verbalize understanding of the Wound Healing Center Program Date Initiated: 07/24/2017 Target Resolution Date: 09/30/2017 Goal Status: Active Interventions: Provide education on orientation to the wound center Notes: ` Wound/Skin Impairment Nursing Diagnoses: Impaired tissue integrity Goals: Ulcer/skin breakdown will heal within 14 weeks Date Initiated: 07/24/2017 Target Resolution Date: 09/30/2017 Goal Status: Active Interventions: Cato MulliganSUTPHEN, Blima IMarland Kitchen. ([id number]) Assess patient/caregiver ability to obtain necessary supplies Assess patient/caregiver ability to perform ulcer/skin care regimen upon admission and as needed Assess ulceration(s) every visit Notes: Electronic Signature(s) Signed: 08/21/2017 2:45:49 PM By: Curtis Sitesorthy, Joanna Entered By: Curtis Sitesorthy, Joanna on 08/21/2017 10:43:49 Yvette ShutterSUTPHEN, Yvette I. ([id number]) -------------------------------------------------------------------------------- Pain Assessment Details Patient Name: Yvette ShutterSUTPHEN, Yvette I. Date of Service: 08/21/2017 10:15 AM Medical Record Number: [id number] Patient Account Number: [id number] Date of  Birth/Sex: 02/03/1951 (66 y.o. Female) Treating RN: Curtis Sitesorthy, Joanna Primary Care Caulin Begley: Jarome MatinPATERSON, DANIEL Other Clinician: Referring Farhiya Rosten: Jarome MatinPATERSON, DANIEL  Treating Townsend Cudworth/Extender: Rudene Re in Treatment: 4 Active Problems Location of Pain Severity and Description of Pain Patient Has Paino No Site Locations Pain Management and Medication Current Pain Management: Notes Topical or injectable lidocaine is offered to patient for acute pain when surgical debridement is performed. If needed, Patient is instructed to use over the counter pain medication for the following 24-48 hours after debridement. Wound care MDs do not prescribed pain medications. Patient has chronic pain or uncontrolled pain. Patient has been instructed to make an appointment with their Primary Care Physician for pain management. Electronic Signature(s) Signed: 08/21/2017 2:45:49 PM By: Curtis Sites Entered By: Curtis Sites on 08/21/2017 10:33:08 [name] ([id number]) -------------------------------------------------------------------------------- Patient/Caregiver Education Details Patient Name: [name] Date of Service: 08/21/2017 10:15 AM Medical Record Number: [id number] Patient Account Number: [id number] Date of Birth/Gender: Jun 18, 1951 (66 y.o. Female) Treating RN: Curtis Sites Primary Care Physician: Jarome Matin Other Clinician: Referring Physician: Jarome Matin Treating Physician/Extender: Rudene Re in Treatment: 4 Education Assessment Education Provided To: Patient Education Topics Provided Wound/Skin Impairment: Handouts: Other: wound care as ordered Methods: Demonstration, Explain/Verbal Responses: State content correctly Electronic Signature(s) Signed: 08/21/2017 2:45:49 PM By: Curtis Sites Entered By: Curtis Sites on 08/21/2017 13:33:46 [name]  ([id number]) -------------------------------------------------------------------------------- Wound Assessment Details Patient Name: [name] Date of Service: 08/21/2017 10:15 AM Medical Record Number: [id number] Patient Account Number: [id number] Date of Birth/Sex: 17-Oct-1950 (66 y.o. Female) Treating RN: Curtis Sites Primary Care Bobetta Korf: Jarome Matin Other Clinician: Referring Tyronda Vizcarrondo: Jarome Matin Treating Kahlel Peake/Extender: Rudene Re in Treatment: 4 Wound Status Wound Number: 1 Primary Etiology: Neuropathic Ulcer-Non Diabetic Wound Location: Left Metatarsal head first - Plantar Wound Status: Open Wounding Event: Gradually Appeared Comorbid History: Arrhythmia, Hypertension, Neuropathy Date Acquired: 01/22/2017 Weeks Of Treatment: 4 Clustered Wound: No Wound Measurements Length: (cm) 0.5 Width: (cm) 0.4 Depth: (cm) 0.3 Area: (cm) 0.157 Volume: (cm) 0.047 % Reduction in Area: -24.6% % Reduction in Volume: 25.4% Epithelialization: None Tunneling: No Undermining: No Wound Description Full Thickness With Exposed Support Classification: Structures Wound Margin: Distinct, outline attached Exudate Large Amount: Exudate Type: Serosanguineous Exudate Color: red, brown Foul Odor After Cleansing: No Slough/Fibrino Yes Wound Bed Granulation Amount: Large (67-100%) Exposed Structure Granulation Quality: Red Fascia Exposed: No Necrotic Amount: Small (1-33%) Fat Layer (Subcutaneous Tissue) Exposed: Yes Necrotic Quality: Eschar, Adherent Slough Tendon Exposed: No Muscle Exposed: No Joint Exposed: No Bone Exposed: No Periwound Skin Texture Texture Color No Abnormalities Noted: No No Abnormalities Noted: No Callus: Yes Atrophie Blanche: No Crepitus: No Cyanosis: No Excoriation: No Ecchymosis: No Induration: No Erythema: No Rash: No Hemosiderin Staining: No Scarring: No Mottled: No Pallor: No Moisture Rubor: No No  Abnormalities Noted: No Dry / Scaly: No Temperature / Pain Gater, Frady I. ([id number]) Maceration: No Temperature: No Abnormality Tenderness on Palpation: Yes Wound Preparation Ulcer Cleansing: Rinsed/Irrigated with Saline Topical Anesthetic Applied: Other: lidocaine 4%, Treatment Notes Wound #1 (Left, Plantar Metatarsal head first) 1. Cleansed with: Cleanse wound with antibacterial soap and water 2. Anesthetic Topical Lidocaine 4% cream to wound bed prior to debridement 4. Dressing Applied: Other dressing (specify in notes) 5. Secondary Dressing Applied Gauze and Kerlix/Conform 6. Footwear/Offloading device applied Felt/Foam Wedge shoe 7. Secured with Tape Notes silvercel Electronic Signature(s) Signed: 08/21/2017 2:45:49 PM By: Curtis Sites Entered By: Curtis Sites on 08/21/2017 10:42:40 [name] ([id number]) -------------------------------------------------------------------------------- Wound Assessment Details Patient Name: [name] Date of Service: 08/21/2017 10:15 AM Medical Record Number: [id number] Patient Account Number: [id number] Date of  Birth/Sex: 05/15/1951 (66 y.o. Female) Treating RN: Curtis Sitesorthy, Joanna Primary Care Lasheba Stevens: Jarome MatinPATERSON, DANIEL Other Clinician: Referring Jaselle Pryer: Jarome MatinPATERSON, DANIEL Treating Mc Bloodworth/Extender: Rudene ReBritto, Errol Weeks in Treatment: 4 Wound Status Wound Number: 2 Primary Etiology: Neuropathic Ulcer-Non Diabetic Wound Location: Left Metatarsal head fifth - Plantar Wound Status: Open Wounding Event: Trauma Comorbid History: Arrhythmia, Hypertension, Neuropathy Date Acquired: 05/27/2017 Weeks Of Treatment: 4 Clustered Wound: No Wound Measurements Length: (cm) 0.7 Width: (cm) 1 Depth: (cm) 0.1 Area: (cm) 0.55 Volume: (cm) 0.055 % Reduction in Area: 46.1% % Reduction in Volume: 46.1% Epithelialization: None Tunneling: No Undermining: No Wound Description Full Thickness With Exposed  Support Classification: Structures Wound Margin: Distinct, outline attached Exudate Large Amount: Exudate Type: Serosanguineous Exudate Color: red, brown Foul Odor After Cleansing: No Slough/Fibrino Yes Wound Bed Granulation Amount: Large (67-100%) Exposed Structure Granulation Quality: Red, Hyper-granulation Fascia Exposed: No Necrotic Amount: Small (1-33%) Fat Layer (Subcutaneous Tissue) Exposed: Yes Necrotic Quality: Adherent Slough Tendon Exposed: No Muscle Exposed: No Joint Exposed: No Bone Exposed: No Periwound Skin Texture Texture Color No Abnormalities Noted: No No Abnormalities Noted: No Callus: Yes Atrophie Blanche: No Crepitus: No Cyanosis: No Excoriation: No Ecchymosis: No Induration: No Erythema: No Rash: No Hemosiderin Staining: No Scarring: No Mottled: No Pallor: No Moisture Rubor: No No Abnormalities Noted: No Dry / Scaly: No Temperature / Pain Goede, Yvette I. ([id number]) Maceration: No Temperature: No Abnormality Tenderness on Palpation: Yes Wound Preparation Ulcer Cleansing: Rinsed/Irrigated with Saline Topical Anesthetic Applied: Other: lidocaine 4%, Treatment Notes Wound #2 (Left, Plantar Metatarsal head fifth) 1. Cleansed with: Cleanse wound with antibacterial soap and water 2. Anesthetic Topical Lidocaine 4% cream to wound bed prior to debridement 4. Dressing Applied: Other dressing (specify in notes) 5. Secondary Dressing Applied Gauze and Kerlix/Conform 6. Footwear/Offloading device applied Felt/Foam Wedge shoe 7. Secured with Tape Notes [employer]cel Electronic Signature(s) Signed: 08/21/2017 2:45:49 PM By: Curtis Sitesorthy, Joanna Entered By: Curtis Sitesorthy, Joanna on 08/21/2017 10:42:55 Yvette ShutterSUTPHEN, Yvette I. ([id number]) -------------------------------------------------------------------------------- Vitals Details Patient Name: Yvette ShutterSUTPHEN, Yvette I. Date of Service: 08/21/2017 10:15 AM Medical Record Number: [id number] Patient Account  Number: [id number] Date of Birth/Sex: 05/15/1951 (66 y.o. Female) Treating RN: Curtis Sitesorthy, Joanna Primary Care Laurier Jasperson: Jarome MatinPATERSON, DANIEL Other Clinician: Referring Arkin Imran: Jarome MatinPATERSON, DANIEL Treating Jauan Wohl/Extender: Rudene ReBritto, Errol Weeks in Treatment: 4 Vital Signs Time Taken: 10:35 Temperature (F): 98.4 Height (in): 64 Pulse (bpm): 80 Weight (lbs): 262 Respiratory Rate (breaths/min): 18 Body Mass Index (BMI): 45 Blood Pressure (mmHg): 148/63 Reference Range: 80 - 120 mg / dl Electronic Signature(s) Signed: 08/21/2017 2:45:49 PM By: Curtis Sitesorthy, Joanna Entered By: Curtis Sitesorthy, Joanna on 08/21/2017 10:40:06

## 2017-08-21 NOTE — Progress Notes (Signed)
NANCEY, KREITZ ([id number]) Visit Report for 08/21/2017 Chief Complaint Document Details Patient Name: SALEEN, PEDEN Date of Service: 08/21/2017 10:15 AM Medical Record Number: [id number] Patient Account Number: [id number] Date of Birth/Sex: April 28, 1951 (66 y.o. Female) Treating RN: Curtis Sites Primary Care Provider: Jarome Matin Other Clinician: Referring Provider: Jarome Matin Treating Provider/Extender: Rudene Re in Treatment: 4 Information Obtained from: Patient Chief Complaint Patient presents to the wound care center for a consult due non healing wound to the left plantar foot which she's had on and off for over 2 years Electronic Signature(s) Signed: 08/21/2017 10:59:51 AM By: Evlyn Kanner MD, FACS Entered By: Evlyn Kanner on 08/21/2017 10:59:51 [name] ([id number]) -------------------------------------------------------------------------------- Debridement Details Patient Name: [name] Date of Service: 08/21/2017 10:15 AM Medical Record Number: [id number] Patient Account Number: [id number] Date of Birth/Sex: Nov 12, 1950 (66 y.o. Female) Treating RN: Curtis Sites Primary Care Provider: Jarome Matin Other Clinician: Referring Provider: Jarome Matin Treating Provider/Extender: Rudene Re in Treatment: 4 Debridement Performed for Wound #1 Left,Plantar Metatarsal head first Assessment: Performed By: Physician Evlyn Kanner, MD Debridement: Debridement Pre-procedure Verification/Time Yes - 10:49 Out Taken: Start Time: 10:49 Pain Control: Lidocaine 4% Topical Solution Level: Skin/Subcutaneous Tissue Total Area Debrided (L x W): 0.5 (cm) x 0.4 (cm) = 0.2 (cm) Tissue and other material Viable, Non-Viable, Callus, Fibrin/Slough, Skin, Subcutaneous debrided: Instrument: Forceps, Scissors Bleeding: Minimum Hemostasis Achieved: Pressure End Time: 10:52 Procedural Pain: 0 Post Procedural Pain:  0 Response to Treatment: Procedure was tolerated well Post Debridement Measurements of Total Wound Length: (cm) 0.5 Width: (cm) 0.4 Depth: (cm) 0.4 Volume: (cm) 0.063 Character of Wound/Ulcer Post Debridement: Improved Post Procedure Diagnosis Same as Pre-procedure Electronic Signature(s) Signed: 08/21/2017 10:59:36 AM By: Evlyn Kanner MD, FACS Signed: 08/21/2017 2:45:49 PM By: Curtis Sites Entered By: Evlyn Kanner on 08/21/2017 10:59:35 [name] ([id number]) -------------------------------------------------------------------------------- Debridement Details Patient Name: [name] Date of Service: 08/21/2017 10:15 AM Medical Record Number: [id number] Patient Account Number: [id number] Date of Birth/Sex: 28-Sep-1950 (66 y.o. Female) Treating RN: Curtis Sites Primary Care Provider: Jarome Matin Other Clinician: Referring Provider: Jarome Matin Treating Provider/Extender: Rudene Re in Treatment: 4 Debridement Performed for Wound #2 Left,Plantar Metatarsal head fifth Assessment: Performed By: Physician Evlyn Kanner, MD Debridement: Debridement Pre-procedure Verification/Time Yes - 10:52 Out Taken: Start Time: 10:52 Pain Control: Lidocaine 4% Topical Solution Level: Skin/Subcutaneous Tissue Total Area Debrided (L x W): 0.7 (cm) x 1 (cm) = 0.7 (cm) Tissue and other material Viable, Non-Viable, Callus, Fibrin/Slough, Skin, Subcutaneous debrided: Instrument: Forceps, Scissors Bleeding: Minimum Hemostasis Achieved: Pressure End Time: 10:55 Procedural Pain: 0 Post Procedural Pain: 0 Response to Treatment: Procedure was tolerated well Post Debridement Measurements of Total Wound Length: (cm) 0.7 Width: (cm) 1 Depth: (cm) 0.2 Volume: (cm) 0.11 Character of Wound/Ulcer Post Debridement: Improved Post Procedure Diagnosis Same as Pre-procedure Electronic Signature(s) Signed: 08/21/2017 10:59:43 AM By: Evlyn Kanner MD,  FACS Signed: 08/21/2017 2:45:49 PM By: Curtis Sites Entered By: Evlyn Kanner on 08/21/2017 10:59:43 [name] ([id number]) -------------------------------------------------------------------------------- HPI Details Patient Name: [name] Date of Service: 08/21/2017 10:15 AM Medical Record Number: [id number] Patient Account Number: [id number] Date of Birth/Sex: 25-May-1951 (66 y.o. Female) Treating RN: Curtis Sites Primary Care Provider: Jarome Matin Other Clinician: Referring Provider: Jarome Matin Treating Provider/Extender: Rudene Re in Treatment: 4 History of Present Illness Location: if plantar foot in the region of the first metatarsal head and the fifth metatarsal head Quality: Patient reports No Pain. Severity: Patient states wound are getting  worse. Duration: Patient has had the wound for > 3 months prior to seeking treatment at the wound center Context: The wound would happen gradually Modifying Factors: Other treatment(s) tried include:[facility] Associated Signs and Symptoms: Patient reports having increase swelling. HPI Description: 66 year old patient [facility] where she was seen by Dr. Laverta BaltimoreHewitt's team saw her for a left foot ulcer which was there for about 4 months. She was seen in the emergency room on 06/05/2017 and they did a MRI which ruled out osteomyelitis and they placed her on antibiotics, which was Keflex for 3 weeks. She was found to have plantar ulcers in the region of the great toe and on the fifth metatarsal head. 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 on examination the patient was found to have 2 ulcers on the plantar aspect of the left forefoot the one on the fifth metatarsal  head was 2-1/2 cm in diameter and down to the muscle. There was a lot of hypertrophic granulation tissue. It did not probe down to bone. Note the patient was seen in July of this year by Dr. Victorino DikeHewitt who reviewed her left hallux valgus and rigidus problems associated with a chronic neuropathic ulcer and considered it medically necessary to consider surgery. She was recommended a hallux MP joint arthrodesis to correct the bunion and anti-tic conditions and she would weight-bear in a cam boot postoperatively. Her most recent MRI done on September 12 -- IMPRESSION: Skin wound at the fifth MTP joint without underlying abscess, septic joint or osteomyelitis.Hallux valgus and first MTP osteoarthritis. Midfoot degenerative change also noted. 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 Right toe brachial index of 0.52 and left of 0.4 suggestive of abnormal arterial flow at rest. left flow was monophasic on the dorsalis pedis and biphasic on the right side. 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 08/10/2017 -- she has upcoming appointments to see Dr. Victorino DikeHewitt and also to see the vascular surgeons at [facility]. Other than that she is doing well and being compliant with a dressing. 08/14/2017 -- she saw Dr. Victorino DikeHewitt earlier this morning and his note is pending. She has not yet had her appointment at the vascular office as yet. 08/21/2017 -- we have not yet received the consultation note from Dr. Victorino DikeHewitt and how vascular appointment is later this week. Electronic Signature(s) Signed: 08/21/2017 11:00:22 AM By: Evlyn KannerBritto, Lekita Kerekes MD, FACS Entered By: Evlyn KannerBritto, Genie Wenke on 08/21/2017 11:00:22 Enriqueta ShutterSUTPHEN,  Maudy I. ([id number]) -------------------------------------------------------------------------------- Physical Exam Details Patient Name: Enriqueta ShutterSUTPHEN, Safa I. Date of Service: 08/21/2017 10:15 AM Medical Record Number: [id number] Patient Account Number: [id number] Date of Birth/Sex: Apr 18, 1951 (66 y.o. Female) Treating RN: Curtis Sitesorthy, Joanna Primary Care Provider: Jarome MatinPATERSON, DANIEL Other Clinician: Referring Provider: Jarome MatinPATERSON, DANIEL Treating Provider/Extender: Rudene ReBritto, Anatalia Kronk Weeks in Treatment: 4 Constitutional . Pulse regular. Respirations normal and unlabored. Afebrile. . Eyes Nonicteric. Reactive to light. Ears, Nose, Mouth, and Throat Lips, teeth, and gums WNL.Marland Kitchen. Moist mucosa without lesions. Neck supple and nontender. No palpable supraclavicular or cervical adenopathy. Normal sized without goiter.  Respiratory WNL. No retractions.. Cardiovascular Pedal Pulses WNL. No clubbing, cyanosis or edema. Lymphatic No adneopathy. No adenopathy. No adenopathy. Musculoskeletal Adexa without tenderness or enlargement.. Digits and nails w/o clubbing, cyanosis, infection, petechiae, ischemia, or inflammatory conditions.. Integumentary (Hair, Skin) No suspicious lesions. No crepitus or fluctuance. No peri-wound warmth or erythema. No masses.Marland Kitchen Psychiatric Judgement and insight Intact.. No evidence of depression, anxiety, or agitation.. Notes with their wounds were sharply debrided with a forcep and scissors and subcutaneous debris and the edges of the wound were nicely trimmed and saucerized. Minimal bleeding controlled with pressure Electronic Signature(s) Signed: 08/21/2017 11:01:04 AM By: Evlyn Kanner MD, FACS Entered By: Evlyn Kanner on 08/21/2017 11:01:03 [name] ([id number]) -------------------------------------------------------------------------------- Physician Orders Details Patient Name: [name] Date of Service: 08/21/2017 10:15 AM Medical Record Number:  [id number] Patient Account Number: [id number] Date of Birth/Sex: 01-07-1951 (66 y.o. Female) Treating RN: Curtis Sites Primary Care Provider: Jarome Matin Other Clinician: Referring Provider: Jarome Matin Treating Provider/Extender: Rudene Re in Treatment: 4 Verbal / Phone Orders: No Diagnosis Coding Wound Cleansing Wound #1 Left,Plantar Metatarsal head first o Clean wound with Normal Saline. o May Shower, gently pat wound dry prior to applying new dressing. Wound #2 Left,Plantar Metatarsal head fifth o Clean wound with Normal Saline. o May Shower, gently pat wound dry prior to applying new dressing. Anesthetic Wound #1 Left,Plantar Metatarsal head first o Topical Lidocaine 4% cream applied to wound bed prior to debridement Wound #2 Left,Plantar Metatarsal head fifth o Topical Lidocaine 4% cream applied to wound bed prior to debridement Primary Wound Dressing Wound #1 Left,Plantar Metatarsal head first o Silvercel Non-Adherent Wound #2 Left,Plantar Metatarsal head fifth o Silvercel Non-Adherent Secondary Dressing Wound #1 Left,Plantar Metatarsal head first o Gauze and Kerlix/Conform Wound #2 Left,Plantar Metatarsal head fifth o Gauze and Kerlix/Conform Dressing Change Frequency Wound #1 Left,Plantar Metatarsal head first o Change dressing every other day. Wound #2 Left,Plantar Metatarsal head fifth o Change dressing every other day. Follow-up Appointments Wound #1 Left,Plantar Metatarsal head first o Return Appointment in 1 week. Wound #2 Left,Plantar Metatarsal head fifth o Return Appointment in 1 week. MARVA, HENDRYX I. ([id number]) Edema Control Wound #1 Left,Plantar Metatarsal head first o Elevate legs to the level of the heart and pump ankles as often as possible Wound #2 Left,Plantar Metatarsal head fifth o Elevate legs to the level of the heart and pump ankles as often as possible Off-Loading Wound #1  Left,Plantar Metatarsal head first o Open toe surgical shoe to: - left foot, felt Wound #2 Left,Plantar Metatarsal head fifth o Open toe surgical shoe to: - left foot, felt Additional Orders / Instructions Wound #1 Left,Plantar Metatarsal head first o Increase protein intake. Wound #2 Left,Plantar Metatarsal head fifth o Increase protein intake. Electronic Signature(s) Signed: 08/21/2017 12:10:21 PM By: Evlyn Kanner MD, FACS Signed: 08/21/2017 2:45:49 PM By: Curtis Sites Entered By: Curtis Sites on 08/21/2017 10:54:20 [name] ([id number]) -------------------------------------------------------------------------------- Problem List Details Patient Name: [name] Date of Service: 08/21/2017 10:15 AM Medical Record Number: [id number] Patient Account Number: [id number] Date of Birth/Sex: July 02, 1951 (66 y.o. 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 Female) Treating RN:  Curtis Sites Primary Care Provider: Jarome Matin Other Clinician: Referring Provider: Jarome Matin Treating Provider/Extender: Rudene Re in Treatment: 4 Subjective Chief Complaint Information obtained from Patient Patient presents to the wound care center for a consult due non healing wound to the left plantar foot which she's had on and off for over 2 years History of Present Illness (HPI) The following HPI elements were documented for the patient's wound: Location: if plantar foot in the region of the first metatarsal head and the fifth metatarsal head Quality: Patient reports No Pain. Severity: Patient states wound are getting worse. Duration: Patient has had the wound for > 3 months prior to seeking treatment at the wound center Context: The wound would happen gradually Modifying Factors: Other treatment(s) tried include:is been under the care of [facility] orthoped[facility] Associated Signs and Symptoms: Patient reports having increase swelling. 66 year old patient referred to was from [facility] orth[facility] where she was seen by Dr. Laverta Baltimore team saw her for a left foot ulcer which was there for about 4 months. She was seen in the emergency room on 06/05/2017 and they did a MRI which ruled out osteomyelitis and they placed her on antibiotics, which was Keflex for 3 weeks. She was found to have plantar ulcers in the region of the great toe and on the fifth metatarsal head. 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 on examination the patient was found to have 2 ulcers on the plantar aspect of the left forefoot the one on the fifth metatarsal head was 2-1/2 cm in diameter and down to the muscle. There was a lot of hypertrophic granulation tissue. It did not probe down to bone. Note the patient was seen in  July of this year by Dr. 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 She was recommended a hallux MP joint arthrodesis to correct the bunion and anti-tic conditions and she would weight-bear in a cam boot postoperatively. Her most recent MRI done on September 12 -- IMPRESSION: Skin wound at the fifth MTP joint without underlying abscess, septic joint or osteomyelitis.Hallux valgus and first MTP osteoarthritis. Midfoot degenerative change also noted. 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 Right toe brachial index of 0.52 and left of 0.4 suggestive of abnormal arterial flow at rest. left flow was monophasic on the dorsalis pedis and biphasic on the right side. 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 08/10/2017 -- she has upcoming appointments to see Dr. Victorino Dike and also to see the vascular surgeons at [facility]. Other than that she is doing well and being compliant with a dressing. 08/14/2017 -- she saw Dr. Victorino Dike earlier this  morning and his note is pending. She has not yet had her appointment at the vascular office as yet. 08/21/2017 -- we have not yet received the consultation note from Dr. Victorino Dike and how vascular appointment is later this week. Patient History Information obtained from Patient. MANDOLIN, FALWELL ([id number]) Social History Former smoker - quit 25 years ago, Marital Status - Single, Alcohol Use - Never, Drug Use - No History, Caffeine Use - Daily. Objective Constitutional Pulse regular. Respirations normal and unlabored.  Afebrile. Vitals Time Taken: 10:35 AM, Height: 64 in, Weight: 262 lbs, BMI: 45, Temperature: 98.4 F, Pulse: 80 bpm, Respiratory Rate: 18 breaths/min, Blood Pressure: 148/63 mmHg. Eyes Nonicteric. Reactive to light. Ears, Nose, Mouth, and Throat Lips, teeth, and gums WNL.Marland Kitchen Moist mucosa without lesions. Neck supple and nontender. No palpable supraclavicular or cervical adenopathy. Normal sized without goiter. Respiratory WNL. No retractions.. Cardiovascular Pedal Pulses WNL. No clubbing, cyanosis or edema. Lymphatic No adneopathy. No adenopathy. No adenopathy. Musculoskeletal Adexa without tenderness or enlargement.. Digits and nails w/o clubbing, cyanosis, infection, petechiae, ischemia, or inflammatory conditions.Marland Kitchen Psychiatric Judgement and insight Intact.. No evidence of depression, anxiety, or agitation.. General Notes: with their wounds were sharply debrided with a forcep and scissors and subcutaneous debris and the edges of the wound were nicely trimmed and saucerized. Minimal bleeding controlled with pressure Integumentary (Hair, Skin) No suspicious lesions. No crepitus or fluctuance. No peri-wound warmth or erythema. No masses.. Wound #1 status is Open. Original cause of wound was Gradually Appeared. The wound is located on the Left,Plantar Metatarsal head first. The wound measures 0.5cm length x 0.4cm width x 0.3cm depth; 0.157cm^2 area and 0.047cm^3 volume. There is Fat Layer (Subcutaneous Tissue) Exposed exposed. There is no tunneling or undermining noted. There is a large amount of serosanguineous drainage noted. The wound margin is distinct with the outline attached to the wound base. There is large (67-100%) red granulation within the wound bed. There is a small (1-33%) amount of necrotic tissue within the Buchbinder, Syanne I. ([id number]) wound bed including Eschar and Adherent Slough. The periwound skin appearance exhibited: Callus. The periwound skin appearance did not  exhibit: Crepitus, Excoriation, Induration, Rash, Scarring, Dry/Scaly, Maceration, Atrophie Blanche, Cyanosis, Ecchymosis, Hemosiderin Staining, Mottled, Pallor, Rubor, Erythema. Periwound temperature was noted as No Abnormality. The periwound has tenderness on palpation. Wound #2 status is Open. Original cause of wound was Trauma. The wound is located on the Left,Plantar Metatarsal head fifth. The wound measures 0.7cm length x 1cm width x 0.1cm depth; 0.55cm^2 area and 0.055cm^3 volume. There is Fat Layer (Subcutaneous Tissue) Exposed exposed. There is no tunneling or undermining noted. There is a large amount of serosanguineous drainage noted. The wound margin is distinct with the outline attached to the wound base. There is large (67-100%) red, hyper - granulation within the wound bed. There is a small (1-33%) amount of necrotic tissue within the wound bed including Adherent Slough. The periwound skin appearance exhibited: Callus. The periwound skin appearance did not exhibit: Crepitus, Excoriation, Induration, Rash, Scarring, Dry/Scaly, Maceration, Atrophie Blanche, Cyanosis, Ecchymosis, Hemosiderin Staining, Mottled, Pallor, Rubor, Erythema. Periwound temperature was noted as No Abnormality. The periwound has tenderness on palpation. 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  There was a Skin/Subcutaneous Tissue Debridement ([id number]) debridement with total area of 0.2 sq cm performed by Evlyn Kanner, MD. with the following instrument(s): Forceps and Scissors to remove Viable and Non-Viable tissue/material including  Fibrin/Slough, Skin, Callus, and Subcutaneous after achieving pain control using Lidocaine 4% Topical Solution. A time out was conducted at 10:49, prior to the start of the procedure. A Minimum amount of bleeding was controlled with Pressure. The procedure was tolerated well with a pain level of 0 throughout and a pain level of 0 following the procedure. Post Debridement Measurements: 0.5cm length x 0.4cm width x 0.4cm depth; 0.063cm^3 volume. Character of Wound/Ulcer Post Debridement is improved. 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 There was a Skin/Subcutaneous Tissue Debridement ([id number]) debridement with total area of 0.7 sq cm performed by Evlyn Kanner, MD. with the following instrument(s): Forceps and Scissors to remove Viable and Non-Viable tissue/material including Fibrin/Slough, Skin, Callus, and Subcutaneous after achieving pain control using Lidocaine 4% Topical Solution. A time out was conducted at 10:52, prior to the start of the procedure. A Minimum amount of bleeding was controlled with Pressure. The procedure was tolerated well with a pain level of 0 throughout and a pain level of 0 following the procedure. Post Debridement Measurements: 0.7cm length x 1cm width x 0.2cm depth; 0.11cm^3 volume. Character of Wound/Ulcer Post Debridement is improved. Post procedure Diagnosis Wound #2: Same as Pre-Procedure Leiterman, Jasmine I. ([id number]) Plan Wound Cleansing: Wound #1 Left,Plantar Metatarsal head first: Clean wound with Normal Saline. May Shower, gently pat wound dry prior to applying new dressing. Wound #2 Left,Plantar Metatarsal head fifth: Clean wound with Normal Saline. May Shower, gently pat wound dry prior to applying new dressing. Anesthetic: Wound #1 Left,Plantar Metatarsal head first: Topical Lidocaine 4% cream  applied to wound bed prior to debridement Wound #2 Left,Plantar Metatarsal head fifth: Topical Lidocaine 4% cream applied to wound bed prior to debridement Primary Wound Dressing: Wound #1 Left,Plantar Metatarsal head first: Silvercel Non-Adherent Wound #2 Left,Plantar Metatarsal head fifth: Silvercel Non-Adherent Secondary Dressing: Wound #1 Left,Plantar Metatarsal head first: Gauze and Kerlix/Conform Wound #2 Left,Plantar Metatarsal head fifth: Gauze and Kerlix/Conform Dressing Change Frequency: Wound #1 Left,Plantar Metatarsal head first: Change dressing every other day. Wound #2 Left,Plantar Metatarsal head fifth: Change dressing every other day. Follow-up Appointments: Wound #1 Left,Plantar Metatarsal head first: Return Appointment in 1 week. Wound #2 Left,Plantar Metatarsal head fifth: Return Appointment in 1 week. Edema Control: Wound #1 Left,Plantar Metatarsal head first: Elevate legs to the level of the heart and pump ankles as often as possible Wound #2 Left,Plantar Metatarsal head fifth: Elevate legs to the level of the heart and pump ankles as often as possible Off-Loading: Wound #1 Left,Plantar Metatarsal head first: Open toe surgical shoe to: - left foot, felt Wound #2 Left,Plantar Metatarsal head fifth: Open toe surgical shoe to: - left foot, felt Additional Orders / Instructions: Wound #1 Left,Plantar Metatarsal head first: Increase protein intake. Wound #2 Left,Plantar Metatarsal head fifth: Increase protein intake. FLORABEL, FAULKS I. ([id number]) after sharp debridement and review today, I have recommended: 1. Silver alginate and an offloading felt and to wear good offloading shoes as prescribed by her orthopedic physicians 2. will await Dr. Laverta Baltimore opinion, who saw her earlier last week 3. Adequate protein, vitamin A, vitamin C and zinc 4. Vascular opinion requested as her left ABI 0.73 with monophasic flow -- appointment  on Wednesday 5. Regular visits  the wound center She has had several questions which have been answered to her satisfaction. Electronic Signature(s) Signed: 08/21/2017 11:02:01 AM By: Evlyn KannerBritto, Donnesha Karg MD, FACS Entered By: Evlyn KannerBritto, Quinteria Chisum on 08/21/2017 11:02:00 Enriqueta ShutterSUTPHEN, Jandy I. ([id number]) -------------------------------------------------------------------------------- ROS/PFSH Details Patient Name: Enriqueta ShutterSUTPHEN, Drew I. Date of Service: 08/21/2017 10:15 AM Medical Record Number: [id number] Patient Account Number: [id number] Date of Birth/Sex: 1950/09/27 (66 y.o. Female) Treating RN: Curtis Sitesorthy, Joanna Primary Care Provider: Jarome MatinPATERSON, DANIEL Other Clinician: Referring Provider: Jarome MatinPATERSON, DANIEL Treating Provider/Extender: Rudene ReBritto, Airiel Oblinger Weeks in Treatment: 4 Information Obtained From Patient Wound History Do you currently have one or more open woundso Yes How many open wounds do you currently haveo 2 Approximately how long have you had your woundso 8 weeks How have you been treating your wound(s) until nowo hydrogel, Has your wound(s) ever healed and then re-openedo No Have you had any lab work done in the past montho Yes [facility] Have you tested positive for an antibiotic resistant organism (MRSA, VRE)o No Have you tested positive for osteomyelitis (bone infection)o No Have you had any tests for circulation on your legso Yes Who ordered the testo hospital [facility] Have you had other problems associated with your woundso Swelling Cardiovascular Medical History: Positive for: Arrhythmia - a fib; Hypertension Neurologic Medical History: Positive for: Neuropathy Immunizations Pneumococcal Vaccine: Received Pneumococcal Vaccination: No Implantable Devices Family and Social History Former smoker - quit 25 years ago; Marital Status - Single; Alcohol Use: Never; Drug Use: No History; Caffeine Use: Daily; Financial Concerns: No; Food, Clothing or Shelter Needs: No; Support  System Lacking: No; Transportation Concerns: No; Advanced Directives: No; Patient does not want information on Advanced Directives Physician Affirmation I have reviewed and agree with the above information. Electronic Signature(s) Signed: 08/21/2017 12:10:21 PM By: Evlyn KannerBritto, Kiernan Farkas MD, FACS Signed: 08/21/2017 2:45:49 PM By: Curtis Sitesorthy, Joanna Entered By: Evlyn KannerBritto, Marsena Taff on 08/21/2017 11:00:29 Enriqueta ShutterSUTPHEN, Kealie I. ([id number]) Enriqueta ShutterSUTPHEN, Sharita I. ([id number]) -------------------------------------------------------------------------------- SuperBill Details Patient Name: Enriqueta ShutterSUTPHEN, Blu I. Date of Service: 08/21/2017 Medical Record Number: [id number] Patient Account Number: [id number] Date of Birth/Sex: 1950/09/27 (66 y.o. 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 Morbid (severe) obesity due to excess calories Facility Procedures CPT4 Code: [id number] Description: 11042 - DEB SUBQ TISSUE 20 SQ CM/< ICD-10 Diagnosis Description L97.522 Non-pressure chronic ulcer of other part of left foot with fat I73.9 Peripheral vascular disease, unspecified 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

## 2017-08-23 ENCOUNTER — Encounter: Payer: Self-pay | Admitting: Surgery

## 2017-08-23 ENCOUNTER — Other Ambulatory Visit: Payer: Self-pay | Admitting: *Deleted

## 2017-08-23 ENCOUNTER — Encounter: Payer: Self-pay | Admitting: *Deleted

## 2017-08-23 ENCOUNTER — Ambulatory Visit (HOSPITAL_COMMUNITY)
Admission: RE | Admit: 2017-08-23 | Discharge: 2017-08-23 | Disposition: A | Discharge status: Home / Self Care | Payer: PPO | Source: Ambulatory Visit | Attending: Surgery | Admitting: Surgery

## 2017-08-23 ENCOUNTER — Ambulatory Visit: Payer: PPO | Admitting: Surgery

## 2017-08-23 VITALS — BP 122/72 | HR 60 | Temp 97.4°F | Resp 18 | Ht 64.75 in | Wt 266.0 lb

## 2017-08-23 DIAGNOSIS — I7025 Atherosclerosis of native arteries of other extremities with ulceration: Secondary | ICD-10-CM | POA: Diagnosis not present

## 2017-08-23 DIAGNOSIS — L97519 Non-pressure chronic ulcer of other part of right foot with unspecified severity: Secondary | ICD-10-CM | POA: Diagnosis not present

## 2017-08-23 DIAGNOSIS — I739 Peripheral vascular disease, unspecified: Secondary | ICD-10-CM | POA: Diagnosis not present

## 2017-08-23 DIAGNOSIS — R0989 Other specified symptoms and signs involving the circulatory and respiratory systems: Secondary | ICD-10-CM | POA: Diagnosis not present

## 2017-08-23 NOTE — Progress Notes (Signed)
 [facility]  Patient name: [name]       MRN: [id number]        DOB: 01/22/1951          Sex: female   REQUESTING PROVIDER:    Dr. Britto   REASON FOR CONSULT:    Left leg ulcer  HISTORY OF PRESENT ILLNESS:   [name] is a 66 y.o. female, who is referred for evaluation and management of left leg ulcer which have been present on and off for 2 years.  Most recent of which began back in September.  She has been going to the wound center.  She tells me that she feels it is getting a little bit smaller.  The patient suffers from paroxysmal atrial fibrillation which is managed with anticoagulation.  She does report having had a TIA like symptom before starting anticoagulation.  She had a nuclear stress test in 2016 which was low risk.  She is a former smoker she is medically managed for hypertension which is controlled.  She has a history of bipolar disorder.  PAST MEDICAL HISTORY        Past Medical History:  Diagnosis Date  . Anxiety   . 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  PT ALSO HAS LOWER BACK PAIN AT TIMES  . Depression   . GERD (gastroesophageal reflux disease)    RARE- OTC ANTACID IF NEEDED--PAST HX OF ULCER  . Headache    wakes up with headache   . Hyperlipidemia   . Hypertension   . Kidney stones    PT PASSED STONES--NO KNOWN STONES AT PRESENT TIMES  . Lumbago 10/02/2013  . Obesity   . OSA (obstructive sleep apnea) 12/30/2013   Very mild OSA with REM accentuation,  tested 11-29-13 at [facility], Dr Athar .   . Periodic limb movement disorder (PLMD) 12/30/2013  . Restless leg syndrome   . Ulcer   . Urinary incontinence      FAMILY HISTORY        Family History  Problem Relation Age of Onset  . Sleep apnea Father   . Multiple sclerosis Sister   . Multiple sclerosis Sister     SOCIAL HISTORY:   Social History        Socioeconomic  History  . Marital status: Single    Spouse name: Not on file  . Number of children: 0  . Years of education: 14  . Highest education level: Not on file  Social Needs  . Financial resource strain: Not on file  . Food insecurity - worry: Not on file  . Food insecurity - inability: Not on file  . Transportation needs - medical: Not on file  . Transportation needs - non-medical: Not on file  Occupational History  . Not on file  Tobacco Use  . Smoking status: Former Smoker    Packs/day: 2.00    Years: 20.00    Pack years: 40.00    Types: Cigarettes    Last attempt to quit: 10/17/1988    Years since quitting: 28.8  . Smokeless tobacco: Never Used  Substance and Sexual Activity  . Alcohol use: No  . Drug use: No    Comment: Marijuana - 25 years ago  . Sexual activity: Not on file  Other Topics Concern  . Not on file  Social History Narrative   Patient is single and lives alone.   Patient is disabled.     Patient has a college education.   Patient is right-handed.   Patient drinks two cokes per day.    ALLERGIES:         Allergies  Allergen Reactions  . Zolpidem Tartrate Other (See Comments)    hallucinations  . Amoxicillin Rash    CURRENT MEDICATIONS:          Current Outpatient Medications  Medication Sig Dispense Refill  . acetaminophen (TYLENOL) 325 MG tablet Take 650 mg by mouth every 6 (six) hours as needed for mild pain.    . amLODipine (NORVASC) 10 MG tablet TAKE 1 TABLET(10 MG) BY MOUTH DAILY 30 tablet 11  . buPROPion (WELLBUTRIN XL) 150 MG 24 hr tablet Take 150 mg by mouth every morning.    . carvedilol (COREG) 6.25 MG tablet Take 1 tablet (6.25 mg total) by mouth 2 (two) times daily. 180 tablet 3  . dofetilide (TIKOSYN) 250 MCG capsule Take 1 capsule (250 mcg total) by mouth 2 (two) times daily. 60 capsule 6  . ELIQUIS 5 MG TABS tablet Take 5 mg by mouth 2 (two) times daily.    . fluticasone (FLONASE) 50 MCG/ACT nasal  spray Place 1 spray daily into both nostrils.    . gabapentin (NEURONTIN) 600 MG tablet Take 600 mg by mouth 4 (four) times daily.    . hydrALAZINE (APRESOLINE) 50 MG tablet Take 1.5 tablets (75 mg total) by mouth 3 (three) times daily. 135 tablet 3  . iron polysaccharides (NIFEREX) 150 MG capsule Take 150 mg by mouth daily.    . lisinopril (PRINIVIL,ZESTRIL) 40 MG tablet Take 40 mg by mouth daily.    . meclizine (ANTIVERT) 25 MG tablet Take 25 mg by mouth 3 (three) times daily as needed for dizziness.    . mirtazapine (REMERON) 15 MG tablet Take 15 mg by mouth at bedtime.    . Multiple Vitamins-Minerals (MULTIVITAMIN & MINERAL PO) Take 1 tablet by mouth daily.     No current facility-administered medications for this visit.     REVIEW OF SYSTEMS:   [X] denotes positive finding, [ ] denotes negative finding Cardiac  Comments:  Chest pain or chest pressure:    Shortness of breath upon exertion: x   Short of breath when lying flat:    Irregular heart rhythm:        Vascular    Pain in calf, thigh, or hip brought on by ambulation:    Pain in feet at night that wakes you up from your sleep:     Blood clot in your veins:    Leg swelling:  x       Pulmonary    Oxygen at home:    Productive cough:     Wheezing:         Neurologic    Sudden weakness in arms or legs:     Sudden numbness in arms or legs:     Sudden onset of difficulty speaking or slurred speech:    Temporary loss of vision in one eye:     Problems with dizziness:         Gastrointestinal    Blood in stool:      Vomited blood:         Genitourinary    Burning when urinating:     Blood in urine:        Psychiatric    Major depression:         Hematologic    Bleeding problems:      Problems with blood clotting too easily:        Skin    Rashes or ulcers: x       Constitutional    Fever or chills:      PHYSICAL EXAM:   There were no vitals filed for this visit.  GENERAL: The patient is a well-nourished female, in no acute distress. The vital signs are documented above. CARDIAC: There is a regular rate and rhythm.  VASCULAR: Nonpalpable pedal pulses on the left. PULMONARY: Nonlabored respirations MUSCULOSKELETAL: There are no major deformities or cyanosis. NEUROLOGIC: No focal weakness or paresthesias are detected. SKIN: 2 circular 6 mm ulcers at the base of the first and fifth toe at the level of the metatarsal head.  There does appear to be granulation tissue at the base.  These are very superficial PSYCHIATRIC: The patient has a normal affect.  STUDIES:   I have reviewed her vascular lab studies from today.  The ABI on the left is 0.73.  On the right is 1.02.  She has monophasic waveforms throughout the left leg with no significant stenosis. She has a toe pressure in the 50s on the left.  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  I have recommended proceeding with angiography beginning in the right groin intervening as indicated.  I discussed the risks and benefits of the procedure.  The patient wishes to proceed, however she is going out of town to visit with her sister and would like to schedule this after January 5.  Therefore, I have scheduled her for Tuesday, January 7.  She will need to be off of her Eliquis prior to the procedure.  I did discuss that this is a limb threatening situation, and she understands.   Wells Cilicia Borden, MD [facility] Tel [phone] Pager [phone]  

## 2017-08-28 ENCOUNTER — Encounter: Payer: PPO | Attending: Physician Assistant | Admitting: Physician Assistant

## 2017-08-28 DIAGNOSIS — L97522 Non-pressure chronic ulcer of other part of left foot with fat layer exposed: Secondary | ICD-10-CM | POA: Diagnosis not present

## 2017-08-28 DIAGNOSIS — G603 Idiopathic progressive neuropathy: Secondary | ICD-10-CM | POA: Insufficient documentation

## 2017-08-28 DIAGNOSIS — I739 Peripheral vascular disease, unspecified: Secondary | ICD-10-CM | POA: Insufficient documentation

## 2017-08-28 DIAGNOSIS — Z87891 Personal history of nicotine dependence: Secondary | ICD-10-CM | POA: Diagnosis not present

## 2017-08-28 DIAGNOSIS — F319 Bipolar disorder, unspecified: Secondary | ICD-10-CM | POA: Insufficient documentation

## 2017-08-28 DIAGNOSIS — I4891 Unspecified atrial fibrillation: Secondary | ICD-10-CM | POA: Diagnosis not present

## 2017-08-28 DIAGNOSIS — Z96653 Presence of artificial knee joint, bilateral: Secondary | ICD-10-CM | POA: Insufficient documentation

## 2017-08-28 DIAGNOSIS — Z6841 Body Mass Index (BMI) 40.0 and over, adult: Secondary | ICD-10-CM | POA: Insufficient documentation

## 2017-08-28 DIAGNOSIS — I10 Essential (primary) hypertension: Secondary | ICD-10-CM | POA: Insufficient documentation

## 2017-08-29 NOTE — Progress Notes (Signed)
[name], [name] ([id number]) Visit Report for 08/28/2017 Chief Complaint Document Details Patient Name: [name], [name] Date of Service: 08/28/2017 11:00 AM Medical Record Number: [id number] Patient Account Number: [id number] Date of Birth/Sex: 1951/07/16 (66 y.o. Female) Treating RN: Curtis Sites Primary Care Provider: Jarome Matin Other Clinician: Referring Provider: Jarome Matin Treating Provider/Extender: Linwood Dibbles, HOYT Weeks in Treatment: 5 Information Obtained from: Patient Chief Complaint Patient presents to the wound care center for a consult due non healing wound to the left plantar foot which she's had on and off for a number of years Electronic Signature(s) Signed: 08/28/2017 5:27:57 PM By: Lenda Kelp PA-C Entered By: Lenda Kelp on 08/28/2017 10:58:47 [name] ([id number]) -------------------------------------------------------------------------------- Debridement Details Patient Name: [name] Date of Service: 08/28/2017 11:00 AM Medical Record Number: [id number] Patient Account Number: [id number] Date of Birth/Sex: [birth year] (66 y.o. Female) Treating RN: Curtis Sites Primary Care Provider: Jarome Matin Other Clinician: Referring Provider: Jarome Matin Treating Provider/Extender: Linwood Dibbles, HOYT Weeks in Treatment: 5 Debridement Performed for Wound #1 Left,Plantar Metatarsal head first Assessment: Performed By: Physician STONE III, HOYT E., PA-C Debridement: Debridement Pre-procedure Verification/Time Yes - 11:09 Out Taken: Start Time: 11:09 Pain Control: Lidocaine 4% Topical Solution Level: Skin/Subcutaneous Tissue Total Area Debrided (L x W): 0.4 (cm) x 0.5 (cm) = 0.2 (cm) Tissue and other material Viable, Non-Viable, Callus, Fibrin/Slough, Subcutaneous debrided: Instrument: Curette Bleeding: Minimum Hemostasis Achieved: Pressure End Time: 11:12 Procedural Pain: 0 Post Procedural Pain: 0 Response  to Treatment: Procedure was tolerated well Post Debridement Measurements of Total Wound Length: (cm) 0.4 Width: (cm) 0.5 Depth: (cm) 0.4 Volume: (cm) 0.063 Character of Wound/Ulcer Post Debridement: Improved Post Procedure Diagnosis Same as Pre-procedure Electronic Signature(s) Signed: 08/28/2017 4:00:26 PM By: Curtis Sites Signed: 08/28/2017 5:27:57 PM By: Lenda Kelp PA-C Entered By: Curtis Sites on 08/28/2017 11:11:51 [name] ([id number]) -------------------------------------------------------------------------------- Debridement Details Patient Name: [name] Date of Service: 08/28/2017 11:00 AM Medical Record Number: [id number] Patient Account Number: [id number] Date of Birth/Sex: [birth year] (66 y.o. Female) Treating RN: Curtis Sites Primary Care Provider: Jarome Matin Other Clinician: Referring Provider: Jarome Matin Treating Provider/Extender: Linwood Dibbles, HOYT Weeks in Treatment: 5 Debridement Performed for Wound #2 Left,Plantar Metatarsal head fifth Assessment: Performed By: Physician STONE III, HOYT E., PA-C Debridement: Debridement Pre-procedure Verification/Time Yes - 11:12 Out Taken: Start Time: 11:12 Pain Control: Lidocaine 4% Topical Solution Level: Skin/Subcutaneous Tissue Total Area Debrided (L x W): 0.6 (cm) x 0.8 (cm) = 0.48 (cm) Tissue and other material Viable, Non-Viable, Callus, Fibrin/Slough, Subcutaneous debrided: Instrument: Curette Bleeding: Minimum Hemostasis Achieved: Pressure End Time: 11:16 Procedural Pain: 0 Post Procedural Pain: 0 Response to Treatment: Procedure was tolerated well Post Debridement Measurements of Total Wound Length: (cm) 0.6 Width: (cm) 0.8 Depth: (cm) 0.2 Volume: (cm) 0.075 Character of Wound/Ulcer Post Debridement: Improved Post Procedure Diagnosis Same as Pre-procedure Electronic Signature(s) Signed: 08/28/2017 4:00:26 PM By: Curtis Sites Signed: 08/28/2017 5:27:57 PM  By: Lenda Kelp PA-C Entered By: Curtis Sites on 08/28/2017 11:16:14 [name] ([id number]) -------------------------------------------------------------------------------- HPI Details Patient Name: [name] Date of Service: 08/28/2017 11:00 AM Medical Record Number: [id number] Patient Account Number: [id number] Date of Birth/Sex: [birth year] (66 y.o. Female) Treating RN: Curtis Sites Primary Care Provider: Jarome Matin Other Clinician: Referring Provider: Jarome Matin Treating Provider/Extender: Linwood Dibbles, HOYT Weeks in Treatment: 5 History of Present Illness Location: if plantar foot in the region of the first metatarsal head and the fifth metatarsal head Quality: Patient reports No Pain.  Severity: Patient states wound are getting worse. Duration: Patient has had the wound for > 3 months prior to seeking treatment at the wound center Context: The wound would happen gradually Modifying Factors: Other treatment(s) tried include:is been under the care of [facility] orthopedics Associated Signs and Symptoms: Patient reports having increase swelling. HPI Description: 66 year old patient referred to was from [facility] orthopedics where she was seen by Dr. Laverta Baltimore team saw her for a left foot ulcer which was there for about 4 months. She was seen in the emergency room on 06/05/2017 and they did a MRI which ruled out osteomyelitis and they placed her on antibiotics, which was Keflex for 3 weeks. She was found to have plantar ulcers in the region of the great toe and on the fifth metatarsal head. 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 on examination the patient was found to have 2 ulcers on the plantar aspect of the left forefoot the one on the fifth metatarsal head was 2-1/2 cm in diameter  and down to the muscle. There was a lot of hypertrophic granulation tissue. It did not probe down to bone. Note the patient was seen in July of this year by Dr. 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 She was recommended a hallux MP joint arthrodesis to correct the bunion and anti-tic conditions and she would weight-bear in a cam boot postoperatively. Her most recent MRI done on September 12 -- IMPRESSION: Skin wound at the fifth MTP joint without underlying abscess, septic joint or osteomyelitis.Hallux valgus and first MTP osteoarthritis. Midfoot degenerative change also noted. 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 Right toe brachial index of 0.52 and left of 0.4 suggestive of abnormal arterial flow at rest. left flow was monophasic on the dorsalis pedis and biphasic on the right side. 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 08/10/2017 -- she has upcoming appointments to see Dr. Victorino Dike and also to see the vascular surgeons at [facility]. Other than that she is doing well and being compliant with a dressing. 08/14/2017 -- she saw Dr. Victorino Dike earlier this morning and his note is pending. She has not yet had her appointment at the vascular office as yet. 08/21/2017 -- we have not yet received the consultation note from Dr. Victorino Dike and how vascular appointment is later this week. 08/28/17 on evaluation today patient appears to be doing fairly well in regard to the ulcers on her left lower extremity. She did have her arterial study with the following  results. According to left arterial duplex study patient's left lower extremity arterial system is patent without evidence of hemodynamically significant stenosis. Peak systolic velocity of the bed superficial femoral artery is suggestive of a 30-49% stenosis with wall irregularity however the ratio is less than 1.5 Fortunately, she tells me that she does have an appointment scheduled for January 8 Lauren arteriogram as well to further evaluate this. No fevers, chills, nausea, or vomiting noted at this time. Overall she is pleased  with how things have been progressing. Electronic Signature(s) [name], Yvette I. ([id number]) Signed: 08/28/2017 5:27:57 PM By: Lenda KelpStone III, Hoyt PA-C Entered By: Lenda KelpStone III, Hoyt on 08/28/2017 12:41:58 [name], Yvette I. ([id number]) -------------------------------------------------------------------------------- Physical Exam Details Patient Name: [name], Yvette I. Date of Service: 08/28/2017 11:00 AM Medical Record Number: [id number] Patient Account Number: [id number] Date of Birth/Sex: [birth year] (66 y.o. Female) Treating RN: Curtis Sitesorthy, Joanna Primary Care Provider: Jarome MatinPATERSON, DANIEL Other Clinician: Referring Provider: Jarome MatinPATERSON, DANIEL Treating Provider/Extender: STONE III, HOYT Weeks in Treatment: 5 Constitutional Well-nourished and well-hydrated in no acute distress. Respiratory normal breathing without difficulty. Psychiatric this patient is able to make decisions and demonstrates good insight into disease process. Alert and Oriented x 3. pleasant and cooperative. Notes Patient's wounds on the left foot did appear to require sharp debridement bilaterally the lateral wound more so than the wound at the head of the first metatarsal. She tolerated both debridement well with only minimal discomfort in both wound bed does appear to be much better especially the first metatarsal location. Patient had more bleeding from the lateral foot wound today. This was  controlled with pressure. Electronic Signature(s) Signed: 08/28/2017 5:27:57 PM By: Lenda KelpStone III, Hoyt PA-C Entered By: Lenda KelpStone III, Hoyt on 08/28/2017 12:43:50 [name], Yvette I. ([id number]) -------------------------------------------------------------------------------- Physician Orders Details Patient Name: [name], Yvette I. Date of Service: 08/28/2017 11:00 AM Medical Record Number: [id number] Patient Account Number: [id number] Date of Birth/Sex: [birth year] (66 y.o. 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 o May Shower, gently pat wound dry prior to applying new dressing. Wound #2 Left,Plantar Metatarsal head fifth o Clean wound with Normal Saline. o May Shower, gently pat wound dry prior to applying new dressing. Anesthetic Wound #1 Left,Plantar Metatarsal head first o Topical Lidocaine 4% cream applied to wound bed prior to debridement Wound #2 Left,Plantar Metatarsal head fifth o Topical Lidocaine 4% cream applied to wound bed prior to debridement Primary Wound Dressing Wound #1 Left,Plantar Metatarsal head first o [place] Non-Adherent Wound #2 Left,Plantar Metatarsal head fifth o [place] Non-Adherent Secondary Dressing Wound #1 Left,Plantar Metatarsal head first o Gauze and Kerlix/Conform Wound #2 Left,Plantar Metatarsal head fifth o Gauze and Kerlix/Conform Dressing Change Frequency Wound #1 Left,Plantar  Metatarsal head first o Change dressing every other day. Wound #2 Left,Plantar Metatarsal head fifth Wolanski, Martina I. ([id number]) o Change dressing every other day. Follow-up Appointments Wound #1 Left,Plantar Metatarsal head first o Return Appointment in 1 week. Wound #2 Left,Plantar Metatarsal head fifth o Return Appointment in 1 week. Edema Control Wound #1 Left,Plantar Metatarsal head first o Elevate legs to the level of the heart and pump ankles as often as possible Wound #2 Left,Plantar Metatarsal head fifth o Elevate legs to the level of the heart and pump ankles as often as possible Off-Loading Wound #1 Left,Plantar Metatarsal head first o Open toe surgical shoe to: - left foot, felt Wound #2 Left,Plantar Metatarsal head fifth o Open toe surgical shoe to: - left foot, felt Additional Orders / Instructions Wound #1 Left,Plantar Metatarsal head first o Increase protein intake. Wound #2 Left,Plantar Metatarsal head fifth o Increase  protein intake. Electronic Signature(s) Signed: 08/28/2017 4:00:26 PM By: Curtis Sites Signed: 08/28/2017 5:27:57 PM By: Lenda Kelp PA-C Entered By: Curtis Sites on 08/28/2017 11:16:51 [name] ([id number]) -------------------------------------------------------------------------------- Problem List Details Patient Name: [name] Date of Service: 08/28/2017 11:00 AM Medical Record Number: [id number] Patient Account Number: [id number] Date of Birth/Sex: July 22, 1951 (66 y.o. 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 Female) Treating RN: Curtis Sites Primary Care Provider: Jarome Matin Other Clinician: Referring Provider: Jarome Matin Treating Provider/Extender: Linwood Dibbles, HOYT Weeks in Treatment: 5 Subjective Chief Complaint Information obtained from Patient Patient presents to the wound care center for a consult due non healing wound to the left plantar foot which she's had on and off for a number of years History of Present Illness (HPI) The following HPI elements were documented for the patient's wound: Location: if plantar foot in the region of the first metatarsal head and the fifth metatarsal head Quality: Patient reports No Pain. Severity: Patient states wound are getting worse. Duration: Patient has had the wound for > 3 months prior to seeking treatment at the wound center Context: The wound would happen gradually Modifying Factors: Other treatment(s) tried include:is been under the care of [facility] orthopedics Associated Signs and Symptoms: Patient reports having increase swelling. 66 year old patient referred to was from [facility] orthopedics where she was seen by Dr. Laverta Baltimore team saw her for a left foot ulcer which was there for about 4 months. She was seen in the emergency room on 06/05/2017 and they did a MRI which ruled out osteomyelitis and they placed her on  antibiotics, which was Keflex for 3 weeks. She was found to have plantar ulcers in the region of the great toe and on the fifth metatarsal head. 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 on examination the patient was found to have 2 ulcers on the plantar aspect of the left forefoot the one on the fifth metatarsal head was 2-1/2 cm in diameter and down to the muscle. There was a lot of hypertrophic granulation tissue. It did not probe down to bone. Note the patient was seen in July of this year by Dr. 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 She was recommended a hallux MP joint arthrodesis to correct the bunion and anti-tic conditions and she would weight-bear in a cam boot postoperatively. Her most recent MRI done on September 12 -- IMPRESSION: Skin wound at the fifth MTP joint without underlying abscess, septic joint or osteomyelitis.Hallux valgus and first MTP osteoarthritis. Midfoot  degenerative change also noted. 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 Right toe brachial index of 0.52 and left of 0.4 suggestive of abnormal arterial flow at rest. left flow was monophasic on the dorsalis pedis and biphasic on the right side. 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 08/10/2017 -- she has upcoming appointments to see Dr. Victorino DikeHewitt and also [facility].  Other than that she is doing well and being compliant with a dressing. 08/14/2017 -- she saw Dr. Victorino DikeHewitt earlier this morning and his note is pending. She has not yet had her appointment at the vascular office as yet. 08/21/2017 -- we have not yet received the consultation note from Dr. Victorino DikeHewitt and how vascular appointment is later this week. 08/28/17 on evaluation today patient appears to be doing fairly well in regard to the ulcers on her left lower extremity. She did have her arterial study with the following results. According to left arterial duplex study patient's left lower extremity arterial system is patent without evidence of Enochs, Betzabe I. ([id number]) hemodynamically significant stenosis. Peak systolic velocity of the bed superficial femoral artery is suggestive of a 30-49% stenosis with wall irregularity however the ratio is less than 1.5 Fortunately, she tells me that she does have an appointment scheduled for January 8 Lauren arteriogram as well to further evaluate this. No fevers, chills, nausea, or vomiting noted at this time. Overall she is pleased with how things have been progressing. Patient History Information obtained from Patient. Social History Former smoker - quit 25 years ago, Marital Status - Single, Alcohol Use - Never, Drug Use - No History, Caffeine Use - Daily. Review of Systems (ROS) Constitutional Symptoms (General Health) Denies complaints or symptoms of Fever, Chills. Respiratory The patient has no complaints or symptoms. Cardiovascular Complains or has symptoms of LE edema. Psychiatric The patient has no complaints or symptoms. Objective Constitutional Well-nourished and well-hydrated in no acute distress. Vitals Time Taken: 10:48 AM, Height: 64 in, Weight: 262 lbs, BMI: 45, Temperature: 98.3 F, Pulse: 59 bpm, Respiratory Rate: 18 breaths/min, Blood Pressure: 123/64 mmHg. Respiratory normal breathing without difficulty. Psychiatric this  patient is able to make decisions and demonstrates good insight into disease process. Alert and Oriented x 3. pleasant and cooperative. General Notes: Patient's wounds on the left foot did appear to require sharp debridement bilaterally the lateral wound more so than the wound at the head of the first metatarsal. She tolerated both debridement well with only minimal discomfort in both wound bed does appear to be much better especially the first metatarsal location. Patient had more bleeding from the lateral foot wound today. This was controlled with pressure. Integumentary (Hair, Skin) Wound #1 status is Open. Original cause of wound was Gradually Appeared. The wound is located on the Left,Plantar Metatarsal head first. The wound measures 0.4cm length x 0.5cm width x 0.3cm depth; 0.157cm^2 area and 0.047cm^3 volume. There is Fat Layer (Subcutaneous Tissue) Exposed exposed. There is no tunneling or undermining noted. There is a large amount of serosanguineous drainage noted. The wound margin is distinct with the outline attached to the wound base. Cato MulliganSUTPHEN, Lacora I. ([id number]) There is large (67-100%) red granulation within the wound bed. There is a small (1-33%) amount  of necrotic tissue within the wound bed including Eschar and Adherent Slough. The periwound skin appearance exhibited: Callus. The periwound skin appearance did not exhibit: Crepitus, Excoriation, Induration, Rash, Scarring, Dry/Scaly, Maceration, Atrophie Blanche, Cyanosis, Ecchymosis, Hemosiderin Staining, Mottled, Pallor, Rubor, Erythema. Periwound temperature was noted as No Abnormality. The periwound has tenderness on palpation. Wound #2 status is Open. Original cause of wound was Trauma. The wound is located on the Left,Plantar Metatarsal head fifth. The wound measures 0.6cm length x 0.8cm width x 0.1cm depth; 0.377cm^2 area and 0.038cm^3 volume. There is Fat Layer (Subcutaneous Tissue) Exposed exposed. There is no tunneling  or undermining noted. There is a large amount of serosanguineous drainage noted. The wound margin is distinct with the outline attached to the wound base. There is large (67-100%) red, hyper - granulation within the wound bed. There is a small (1-33%) amount of necrotic tissue within the wound bed including Adherent Slough. The periwound skin appearance exhibited: Callus. The periwound skin appearance did not exhibit: Crepitus, Excoriation, Induration, Rash, Scarring, Dry/Scaly, Maceration, Atrophie Blanche, Cyanosis, Ecchymosis, Hemosiderin Staining, Mottled, Pallor, Rubor, Erythema. Periwound temperature was noted as No Abnormality. The periwound has tenderness on palpation. 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 There was a Skin/Subcutaneous Tissue Debridement ([id number]) debridement with total area of 0.2 sq cm performed by STONE III, HOYT E., PA-C. with the following instrument(s): Curette to remove Viable and Non-Viable tissue/material including Fibrin/Slough, Callus, and Subcutaneous after achieving pain control using Lidocaine 4% Topical Solution. A time out was conducted at 11:09, prior to the start of the procedure. A Minimum amount of bleeding was controlled with Pressure. The procedure was tolerated well with a pain level of 0 throughout and a pain level of 0 following the procedure. Post Debridement Measurements: 0.4cm length x 0.5cm width x 0.4cm depth; 0.063cm^3 volume. Character of Wound/Ulcer Post Debridement is improved. Post procedure Diagnosis Wound #1: Same as Pre-Procedure Wound #2 Pre-procedure  diagnosis of Wound #2 is a Neuropathic Ulcer-Non Diabetic located on the Left,Plantar Metatarsal head fifth . There was a Skin/Subcutaneous Tissue Debridement ([id number]) debridement with total area of 0.48 sq cm performed by STONE III, HOYT E., PA-C. with the following instrument(s): Curette to remove Viable and Non-Viable tissue/material including Fibrin/Slough, Callus, and Subcutaneous after achieving pain control using Lidocaine 4% Topical Solution. A time out was conducted at 11:12, prior to the start of the procedure. A Minimum amount of bleeding was controlled with Pressure. The procedure was tolerated well with a pain level of 0 throughout and a pain level of 0 following the procedure. Post Debridement Measurements: 0.6cm length x 0.8cm width x 0.2cm depth; 0.075cm^3 volume. Character of Wound/Ulcer Post Debridement is improved. Post procedure Diagnosis Wound #2: Same as Pre-Procedure Blount, Linh I. ([id number]) Plan Wound Cleansing: Wound #1 Left,Plantar Metatarsal head first: Clean wound with Normal Saline. May Shower, gently pat wound dry prior to applying new dressing. Wound #2 Left,Plantar Metatarsal head fifth: Clean wound with Normal Saline. May Shower, gently pat wound dry prior to applying new dressing. Anesthetic: Wound #1 Left,Plantar Metatarsal head first: Topical Lidocaine 4% cream applied to wound bed prior to debridement Wound #2 Left,Plantar Metatarsal head fifth: Topical Lidocaine 4% cream applied  to wound bed prior to debridement Primary Wound Dressing: Wound #1 Left,Plantar Metatarsal head first: Silvercel Non-Adherent Wound #2 Left,Plantar Metatarsal head fifth: [place]cel Non-Adherent Secondary Dressing: Wound #1 Left,Plantar Metatarsal head first: Gauze and Kerlix/Conform Wound #2 Left,Plantar Metatarsal head fifth: Gauze and Kerlix/Conform Dressing Change Frequency: Wound #1 Left,Plantar Metatarsal head first: Change dressing every other  day. Wound #2 Left,Plantar Metatarsal head fifth: Change dressing every other day. Follow-up Appointments: Wound #1 Left,Plantar Metatarsal head first: Return Appointment in 1 week. Wound #2 Left,Plantar Metatarsal head fifth: Return Appointment in 1 week. Edema Control: Wound #1 Left,Plantar Metatarsal head first: Elevate legs to the level of the heart and pump ankles as often as possible Wound #2 Left,Plantar Metatarsal head fifth: Elevate legs to the level of the heart and pump ankles as often as possible Off-Loading: Wound #1 Left,Plantar Metatarsal head first: Open toe surgical shoe to: - left foot, felt Wound #2 Left,Plantar Metatarsal head fifth: Open toe surgical shoe to: - left foot, felt Additional Orders / Instructions: Wound #1 Left,Plantar Metatarsal head first: Increase protein intake. Wound #2 Left,Plantar Metatarsal head fifth: Increase protein intake. CALIANNA, KIM IMarland Kitchen ([id number]) I'm gonna recommend that we continue with the Current wound care measures for the next week. We will see were things stand following. Hopefully this will continue to show signs of improvement and we will see what the arteriogram says whenever she has this on January 8. Please see above for specific wound care orders. We will see patient for re-evaluation in 1 week here in the clinic. If anything worsens or changes patient will contact our office for additional recommendations. Electronic Signature(s) Signed: 08/28/2017 5:27:57 PM By: Lenda Kelp PA-C Entered By: Lenda Kelp on 08/28/2017 12:44:44 [name] ([id number]) -------------------------------------------------------------------------------- ROS/PFSH Details Patient Name: [name] Date of Service: 08/28/2017 11:00 AM Medical Record Number: [id number] Patient Account Number: [id number] Date of Birth/Sex: 1951-09-26 (66 y.o. Female) Treating RN: Curtis Sites Primary Care Provider: Jarome Matin  Other Clinician: Referring Provider: Jarome Matin Treating Provider/Extender: Linwood Dibbles, HOYT Weeks in Treatment: 5 Information Obtained From Patient Wound History Do you currently have one or more open woundso Yes How many open wounds do you currently haveo 2 Approximately how long have you had your woundso 8 weeks How have you been treating your wound(s) until nowo hydrogel, Has your wound(s) ever healed and then re-openedo No Have you had any lab work done in the past montho Yes Who ordered the lab work doneo [facility] Have you tested positive for an antibiotic resistant organism (MRSA, VRE)o No Have you tested positive for osteomyelitis (bone infection)o No Have you had any tests for circulation on your legso Yes Who ordered the testo hospital Where was the test doneo [facility] Have you had other problems associated with your woundso Swelling Constitutional Symptoms (General Health) Complaints and Symptoms: Negative for: Fever; Chills Cardiovascular Complaints and Symptoms: Positive for: LE edema Medical History: Positive for: Arrhythmia - a fib; Hypertension Respiratory Complaints and Symptoms: No Complaints or Symptoms Neurologic Medical History: Positive for: Neuropathy Psychiatric Complaints and Symptoms: No Complaints or Symptoms Immunizations Pneumococcal Vaccine: Received Pneumococcal Vaccination: No Dicioccio, Eiko I. ([id number]) Implantable Devices Family and Social History Former smoker - quit 25 years ago; Marital Status - Single; Alcohol Use: Never; Drug Use: No History; Caffeine Use: Daily; Financial Concerns: No; Food, Clothing or Shelter Needs: No; Support System Lacking: No; Transportation Concerns: No; Advanced Directives: No; Patient does not want information on Advanced Directives Physician Affirmation I have reviewed  and agree with the above information. Electronic Signature(s) Signed: 08/28/2017 4:00:26 PM By: Curtis Sites Signed: 08/28/2017  5:27:57 PM By: Lenda Kelp PA-C Entered By: Lenda Kelp on 08/28/2017 12:42:38 [name] ([id number]) -------------------------------------------------------------------------------- SuperBill Details Patient Name: [name] Date of Service: 08/28/2017 Medical Record Number: [id number] Patient Account Number: [id number] Date of Birth/Sex: [birth year] (66 y.o. 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 Morbid (severe) obesity due to excess calories Facility Procedures CPT4 Code: [id number] Description: 11042 - DEB SUBQ TISSUE 20 SQ CM/< ICD-10 Diagnosis Description L97.522 Non-pressure chronic ulcer of other part of left foot with fat Modifier: layer exposed Quantity: 1 Physician Procedures CPT4 Code: [id number] Description: 11042 - WC PHYS SUBQ TISS 20 SQ CM ICD-10 Diagnosis Description L97.522 Non-pressure chronic ulcer of other part of left foot with fat Modifier: layer exposed Quantity: 1 Electronic Signature(s) Signed: 08/28/2017 5:27:57 PM By: Lenda Kelp PA-C Entered By: Lenda Kelp on 08/28/2017 12:44:56

## 2017-08-29 NOTE — Progress Notes (Signed)
[name], Deneice I. ([id number]) Visit Report for 08/28/2017 Arrival Information Details Patient Name: [name], Ezme I. Date of Service: 08/28/2017 11:00 AM Medical Record Number: [id number] Patient Account Number: [id number] Date of Birth/Sex: 03/31/1951 (66 y.o. Female) Treating RN: Curtis Sitesorthy, Joanna Primary Care Jaeli Grubb: Jarome MatinPATERSON, DANIEL Other Clinician: Referring Altha Sweitzer: Jarome MatinPATERSON, DANIEL Treating Polk Minor/Extender: Linwood DibblesSTONE III, HOYT Weeks in Treatment: 5 Visit Information History Since Last Visit Added or deleted any medications: No Patient Arrived: Cane Any new allergies or adverse reactions: No Arrival Time: 10:45 Had a fall or experienced change in No Accompanied By: self activities of daily living that may affect Transfer Assistance: None risk of falls: Patient Identification Verified: Yes Signs or symptoms of abuse/neglect since last visito No Secondary Verification Process Yes Hospitalized since last visit: No Completed: Has Dressing in Place as Prescribed: Yes Patient Has Alerts: Yes Pain Present Now: No Patient Alerts: Patient on Blood Thinner Eliquis Electronic Signature(s) Signed: 08/28/2017 4:00:26 PM By: Curtis Sitesorthy, Joanna Entered By: Curtis Sitesorthy, Joanna on 08/28/2017 10:48:32 [name], Adelheid I. ([id number]) -------------------------------------------------------------------------------- Encounter Discharge Information Details Patient Name: [name], Tyaisha I. Date of Service: 08/28/2017 11:00 AM Medical Record Number: [id number] Patient Account Number: [id number] Date of Birth/Sex: 03/31/1951 (66 y.o. Female) Treating RN: Curtis Sitesorthy, Joanna Primary Care Lessly Stigler: Jarome MatinPATERSON, DANIEL Other Clinician: Referring Javien Tesch: Jarome MatinPATERSON, DANIEL Treating Shonique Pelphrey/Extender: Linwood DibblesSTONE III, HOYT Weeks in Treatment: 5 Encounter Discharge Information Items Discharge Pain Level: 0 Discharge Condition: Stable Ambulatory Status: Wheelchair Discharge Destination: Home Transportation: Private  Auto Accompanied By: self Schedule Follow-up Appointment: Yes Medication Reconciliation completed and No provided to Patient/Care Azizah Lisle: Provided on Clinical Summary of Care: 08/28/2017 Form Type Recipient Paper Patient CS Electronic Signature(s) Signed: 08/28/2017 11:45:12 AM By: Curtis Sitesorthy, Joanna Entered By: Curtis Sitesorthy, Joanna on 08/28/2017 11:45:11 [name], Malasha I. ([id number]) -------------------------------------------------------------------------------- Lower Extremity Assessment Details Patient Name: [name], Elissia I. Date of Service: 08/28/2017 11:00 AM Medical Record Number: [id number] Patient Account Number: [id number] Date of Birth/Sex: 03/31/1951 (66 y.o. Female) Treating RN: Curtis Sitesorthy, Joanna Primary Care Nicky Kras: Jarome MatinPATERSON, DANIEL Other Clinician: Referring Lindwood Mogel: Jarome MatinPATERSON, DANIEL Treating Bryam Taborda/Extender: Linwood DibblesSTONE III, HOYT Weeks in Treatment: 5 Vascular Assessment Pulses: Dorsalis Pedis Palpable: [Left:Yes] Posterior Tibial Extremity colors, hair growth, and conditions: Extremity Color: [Left:Normal] Hair Growth on Extremity: [Left:No] Temperature of Extremity: [Left:Warm] Capillary Refill: [Left:< 3 seconds] Electronic Signature(s) Signed: 08/28/2017 10:56:07 AM By: Curtis Sitesorthy, Joanna Entered By: Curtis Sitesorthy, Joanna on 08/28/2017 10:56:07 [name], Pennye I. ([id number]) -------------------------------------------------------------------------------- Multi Wound Chart Details Patient Name: [name], Fionna I. Date of Service: 08/28/2017 11:00 AM Medical Record Number: [id number] Patient Account Number: [id number] Date of Birth/Sex: 03/31/1951 (66 y.o. Female) Treating RN: Curtis Sitesorthy, Joanna Primary Care Imanuel Pruiett: Jarome MatinPATERSON, DANIEL Other Clinician: Referring Jamyson Jirak: Jarome MatinPATERSON, DANIEL Treating [place] Mcphee/Extender: Linwood DibblesSTONE III, HOYT Weeks in Treatment: 5 Vital Signs Height(in): 64 Pulse(bpm): 59 Weight(lbs): 262 Blood Pressure(mmHg): 123/64 Body Mass Index(BMI):  45 Temperature(F): 98.3 Respiratory Rate 18 (breaths/min): Photos: [1:No Photos] [2:No Photos] [N/A:N/A] Wound Location: [1:Left Metatarsal head first - Plantar] [2:Left Metatarsal head fifth - Plantar] [N/A:N/A] Wounding Event: [1:Gradually Appeared] [2:Trauma] [N/A:N/A] 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] [2:Neuropathic Ulcer-Non Diabetic] [N/A:N/A] Comorbid History: [1:Arrhythmia, Hypertension, Neuropathy] [2:Arrhythmia, Hypertension, Neuropathy] [N/A:N/A] Date Acquired: [1:01/22/2017] [2:05/27/2017] [N/A:N/A] Weeks of Treatment: [1:5] [2:5] [N/A:N/A] Wound Status: [1:Open] [2:Open] [N/A:N/A] Measurements L x W x D [1:0.4x0.5x0.3] [2:0.6x0.8x0.1] [N/A:N/A] (cm) Area (cm) : [1:0.157] [2:0.377] [N/A:N/A] Volume (cm) : [1:0.047] [2:0.038] [N/A:N/A] % Reduction in Area: [1:-24.60%] [2:63.10%] [N/A:N/A] % Reduction in Volume: [1:25.40%] [2:62.70%] [N/A:N/A] Classification: [1:Full Thickness With Exposed Support Structures] [2:Full Thickness With Exposed Support Structures] [N/A:N/A] Exudate Amount: [1:Large] [2:Large] [N/A:N/A]  Exudate Type: [1:Serosanguineous] [2:Serosanguineous] [N/A:N/A] Exudate Color: [1:red, brown] [2:red, brown] [N/A:N/A] Wound Margin: [1:Distinct, outline attached] [2:Distinct, outline attached] [N/A:N/A] Granulation Amount: [1:Large (67-100%)] [2:Large (67-100%)] [N/A:N/A] Granulation Quality: [1:Red] [2:Red, Hyper-granulation] [N/A:N/A] Necrotic Amount: [1:Small (1-33%)] [2:Small (1-33%)] [N/A:N/A] Necrotic Tissue: [1:Eschar, Adherent Slough] [2:Adherent Slough] [N/A:N/A] Exposed Structures: [1:Fat Layer (Subcutaneous Tissue) Exposed: Yes Fascia: No Tendon: No Muscle: No Joint: No Bone: No] [2:Fat Layer (Subcutaneous Tissue) Exposed: Yes Fascia: No Tendon: No Muscle: No Joint: No Bone: No] [N/A:N/A] Epithelialization: [1:None] [2:None] [N/A:N/A] Periwound Skin Texture: [1:Callus: Yes Excoriation: No] [2:Callus: Yes Excoriation: No]  [N/A:N/A] Induration: No Induration: No Crepitus: No Crepitus: No Rash: No Rash: No Scarring: No Scarring: No Periwound Skin Moisture: Maceration: No Maceration: No N/A Dry/Scaly: No Dry/Scaly: No Periwound Skin Color: Atrophie Blanche: No Atrophie Blanche: No N/A Cyanosis: No Cyanosis: No Ecchymosis: No Ecchymosis: No Erythema: No Erythema: No Hemosiderin Staining: No Hemosiderin Staining: No Mottled: No Mottled: No Pallor: No Pallor: No Rubor: No Rubor: No Temperature: No Abnormality No Abnormality N/A Tenderness on Palpation: Yes Yes N/A Wound Preparation: Ulcer Cleansing: Ulcer Cleansing: N/A Rinsed/Irrigated with Saline Rinsed/Irrigated with Saline Topical Anesthetic Applied: Topical Anesthetic Applied: Other: lidocaine 4% Other: lidocaine 4% Treatment Notes Electronic Signature(s) Signed: 08/28/2017 4:00:26 PM By: Curtis Sites Entered By: Curtis Sites on 08/28/2017 11:06:40 Yvette Shutter ([id number]) -------------------------------------------------------------------------------- Multi-Disciplinary Care Plan Details Patient Name: Yvette Shutter Date of Service: 08/28/2017 11:00 AM Medical Record Number: [id number] Patient Account Number: [id number] Date of Birth/Sex: 09/28/1950 (66 y.o. Female) Treating RN: Curtis Sites Primary Care Sina Lucchesi: Jarome Matin Other Clinician: Referring Ledon Weihe: Jarome Matin Treating Contrell Ballentine/Extender: Linwood Dibbles, HOYT Weeks in Treatment: 5 Active Inactive ` Abuse / Safety / Falls / Self Care Management Nursing Diagnoses: Potential for falls Goals: Patient will not experience any injury related to falls Date Initiated: 07/24/2017 Target Resolution Date: 09/30/2017 Goal Status: Active Interventions: Assess fall risk on admission and as needed Notes: ` Orientation to the Wound Care Program Nursing Diagnoses: Knowledge deficit related to the wound healing center program Goals: Patient/caregiver  will verbalize understanding of the Wound Healing Center Program Date Initiated: 07/24/2017 Target Resolution Date: 09/30/2017 Goal Status: Active Interventions: Provide education on orientation to the wound center Notes: ` Wound/Skin Impairment Nursing Diagnoses: Impaired tissue integrity Goals: Ulcer/skin breakdown will heal within 14 weeks Date Initiated: 07/24/2017 Target Resolution Date: 09/30/2017 Goal Status: Active Interventions: GAILE, ALLMON IMarland Kitchen ([id number]) Assess patient/caregiver ability to obtain necessary supplies Assess patient/caregiver ability to perform ulcer/skin care regimen upon admission and as needed Assess ulceration(s) every visit Notes: Electronic Signature(s) Signed: 08/28/2017 4:00:26 PM By: Curtis Sites Entered By: Curtis Sites on 08/28/2017 11:06:30 Yvette Shutter ([id number]) -------------------------------------------------------------------------------- Pain Assessment Details Patient Name: Yvette Shutter Date of Service: 08/28/2017 11:00 AM Medical Record Number: [id number] Patient Account Number: [id number] Date of Birth/Sex: Sep 13, 1951 (66 y.o. Female) Treating RN: Curtis Sites Primary Care Kenisha Lynds: Jarome Matin Other Clinician: Referring Almeter Westhoff: Jarome Matin Treating Tamirra Sienkiewicz/Extender: Linwood Dibbles, HOYT Weeks in Treatment: 5 Active Problems Location of Pain Severity and Description of Pain Patient Has Paino No Site Locations Pain Management and Medication Current Pain Management: Notes Topical or injectable lidocaine is offered to patient for acute pain when surgical debridement is performed. If needed, Patient is instructed to use over the counter pain medication for the following 24-48 hours after debridement. Wound care MDs do not prescribed pain medications. Patient has chronic pain or uncontrolled pain. Patient has been instructed to make an appointment with their Primary Care Physician for pain  management. Electronic Signature(s) Signed: 08/28/2017 4:00:26 PM By: Curtis Sitesorthy, Joanna Entered By: Curtis Sitesorthy, Joanna on 08/28/2017 10:48:40 [name], Vanita I. ([id number]) -------------------------------------------------------------------------------- Patient/Caregiver Education Details Patient Name: [name], Sahvannah I. Date of Service: 08/28/2017 11:00 AM Medical Record Number: [id number] Patient Account Number: [id number] Date of Birth/Gender: 12/04/1950 (66 y.o. Female) Treating RN: Curtis Sitesorthy, Joanna Primary Care Physician: Jarome MatinPATERSON, DANIEL Other Clinician: Referring Physician: Jarome MatinPATERSON, DANIEL Treating Physician/Extender: Skeet SimmerSTONE III, HOYT Weeks in Treatment: 5 Education Assessment Education Provided To: Patient Education Topics Provided Wound/Skin Impairment: Handouts: Other: wound care as orfdered Methods: Demonstration, Explain/Verbal Responses: State content correctly Electronic Signature(s) Signed: 08/28/2017 4:00:26 PM By: Curtis Sitesorthy, Joanna Entered By: Curtis Sitesorthy, Joanna on 08/28/2017 11:46:14 [name], Kenslie I. ([id number]) -------------------------------------------------------------------------------- Wound Assessment Details Patient Name: [name], Jessee I. Date of Service: 08/28/2017 11:00 AM Medical Record Number: [id number] Patient Account Number: [id number] Date of Birth/Sex: 12/04/1950 (66 y.o. Female) Treating RN: Curtis Sitesorthy, Joanna Primary Care Esly Selvage: Jarome MatinPATERSON, DANIEL Other Clinician: Referring Raeqwon Lux: Jarome MatinPATERSON, DANIEL Treating Toben Acuna/Extender: STONE III, HOYT Weeks in Treatment: 5 Wound Status Wound Number: 1 Primary Etiology: Neuropathic Ulcer-Non Diabetic Wound Location: Left Metatarsal head first - Plantar Wound Status: Open Wounding Event: Gradually Appeared Comorbid History: Arrhythmia, Hypertension, Neuropathy Date Acquired: 01/22/2017 Weeks Of Treatment: 5 Clustered Wound: No Photos Photo Uploaded By: Curtis Sitesorthy, Joanna on 08/28/2017 12:24:17 Wound  Measurements Length: (cm) 0.4 Width: (cm) 0.5 Depth: (cm) 0.3 Area: (cm) 0.157 Volume: (cm) 0.047 % Reduction in Area: -24.6% % Reduction in Volume: 25.4% Epithelialization: None Tunneling: No Undermining: No Wound Description Full Thickness With Exposed Support Classification: Structures Wound Margin: Distinct, outline attached Exudate Large Amount: Exudate Type: Serosanguineous Exudate Color: red, brown Foul Odor After Cleansing: No Slough/Fibrino Yes Wound Bed Granulation Amount: Large (67-100%) Exposed Structure Granulation Quality: Red Fascia Exposed: No Necrotic Amount: Small (1-33%) Fat Layer (Subcutaneous Tissue) Exposed: Yes Necrotic Quality: Eschar, Adherent Slough Tendon Exposed: No Muscle Exposed: No Joint Exposed: No Bone Exposed: No Dwiggins, Miche I. ([id number]) Periwound Skin Texture Texture Color No Abnormalities Noted: No No Abnormalities Noted: No Callus: Yes Atrophie Blanche: No Crepitus: No Cyanosis: No Excoriation: No Ecchymosis: No Induration: No Erythema: No Rash: No Hemosiderin Staining: No Scarring: No Mottled: No Pallor: No Moisture Rubor: No No Abnormalities Noted: No Dry / Scaly: No Temperature / Pain Maceration: No Temperature: No Abnormality Tenderness on Palpation: Yes Wound Preparation Ulcer Cleansing: Rinsed/Irrigated with Saline Topical Anesthetic Applied: Other: lidocaine 4%, Treatment Notes Wound #1 (Left, Plantar Metatarsal head first) 1. Cleansed with: Clean wound with Normal Saline 2. Anesthetic Topical Lidocaine 4% cream to wound bed prior to debridement 4. Dressing Applied: Other dressing (specify in notes) 5. Secondary Dressing Applied Gauze and Kerlix/Conform 6. Footwear/Offloading device applied Felt/Foam Wedge shoe 7. Secured with Tape Notes silvercel Electronic Signature(s) Signed: 08/28/2017 10:55:42 AM By: Curtis Sitesorthy, Joanna Entered By: Curtis Sitesorthy, Joanna on 08/28/2017 10:55:42 [name],  Ambry I. ([id number]) -------------------------------------------------------------------------------- Wound Assessment Details Patient Name: [name], Estalene I. Date of Service: 08/28/2017 11:00 AM Medical Record Number: [id number] Patient Account Number: [id number] Date of Birth/Sex: 12/04/1950 (66 y.o. Female) Treating RN: Curtis Sitesorthy, Joanna Primary Care Siearra Amberg: Jarome MatinPATERSON, DANIEL Other Clinician: Referring Hanford Lust: Jarome MatinPATERSON, DANIEL Treating Jilliam Bellmore/Extender: STONE III, HOYT Weeks in Treatment: 5 Wound Status Wound Number: 2 Primary Etiology: Neuropathic Ulcer-Non Diabetic Wound Location: Left Metatarsal head fifth - Plantar Wound Status: Open Wounding Event: Trauma Comorbid History: Arrhythmia, Hypertension, Neuropathy Date Acquired: 05/27/2017 Weeks Of Treatment: 5 Clustered Wound: No Photos Photo Uploaded By: Curtis Sitesorthy, Joanna on 08/28/2017 12:24:18 Wound Measurements Length: (cm) 0.6 Width: (cm) 0.8 Depth: (cm) 0.1  Area: (cm) 0.377 Volume: (cm) 0.038 % Reduction in Area: 63.1% % Reduction in Volume: 62.7% Epithelialization: None Tunneling: No Undermining: No Wound Description Full Thickness With Exposed Support Classification: Structures Wound Margin: Distinct, outline attached Exudate Large Amount: Exudate Type: Serosanguineous Exudate Color: red, brown Foul Odor After Cleansing: No Slough/Fibrino Yes Wound Bed Granulation Amount: Large (67-100%) Exposed Structure Granulation Quality: Red, Hyper-granulation Fascia Exposed: No Necrotic Amount: Small (1-33%) Fat Layer (Subcutaneous Tissue) Exposed: Yes Necrotic Quality: Adherent Slough Tendon Exposed: No Muscle Exposed: No Joint Exposed: No Bone Exposed: No Glascoe, Timya I. ([id number]) Periwound Skin Texture Texture Color No Abnormalities Noted: No No Abnormalities Noted: No Callus: Yes Atrophie Blanche: No Crepitus: No Cyanosis: No Excoriation: No Ecchymosis: No Induration: No Erythema:  No Rash: No Hemosiderin Staining: No Scarring: No Mottled: No Pallor: No Moisture Rubor: No No Abnormalities Noted: No Dry / Scaly: No Temperature / Pain Maceration: No Temperature: No Abnormality Tenderness on Palpation: Yes Wound Preparation Ulcer Cleansing: Rinsed/Irrigated with Saline Topical Anesthetic Applied: Other: lidocaine 4%, Treatment Notes Wound #2 (Left, Plantar Metatarsal head fifth) 1. Cleansed with: Clean wound with Normal Saline 2. Anesthetic Topical Lidocaine 4% cream to wound bed prior to debridement 4. Dressing Applied: Other dressing (specify in notes) 5. Secondary Dressing Applied Gauze and Kerlix/Conform 6. Footwear/Offloading device applied Felt/Foam Wedge shoe 7. Secured with Tape Notes silvercel Electronic Signature(s) Signed: 08/28/2017 10:55:52 AM By: Curtis Sites Entered By: Curtis Sites on 08/28/2017 10:55:52 Yvette Shutter ([id number]) -------------------------------------------------------------------------------- Vitals Details Patient Name: Yvette Shutter Date of Service: 08/28/2017 11:00 AM Medical Record Number: [id number] Patient Account Number: [id number] Date of Birth/Sex: 03-Jul-1951 (66 y.o. Female) Treating RN: Curtis Sites Primary Care Zniyah Midkiff: Jarome Matin Other Clinician: Referring Domonik Levario: Jarome Matin Treating Caress Reffitt/Extender: Linwood Dibbles, HOYT Weeks in Treatment: 5 Vital Signs Time Taken: 10:48 Temperature (F): 98.3 Height (in): 64 Pulse (bpm): 59 Weight (lbs): 262 Respiratory Rate (breaths/min): 18 Body Mass Index (BMI): 45 Blood Pressure (mmHg): 123/64 Reference Range: 80 - 120 mg / dl Electronic Signature(s) Signed: 08/28/2017 4:00:26 PM By: Curtis Sites Entered By: Curtis Sites on 08/28/2017 10:49:04

## 2017-09-04 ENCOUNTER — Ambulatory Visit: Payer: PPO | Admitting: Surgery

## 2017-09-05 DIAGNOSIS — K056 Periodontal disease, unspecified: Secondary | ICD-10-CM | POA: Diagnosis not present

## 2017-09-05 DIAGNOSIS — I480 Paroxysmal atrial fibrillation: Secondary | ICD-10-CM | POA: Diagnosis not present

## 2017-09-05 DIAGNOSIS — L658 Other specified nonscarring hair loss: Secondary | ICD-10-CM | POA: Diagnosis not present

## 2017-09-05 DIAGNOSIS — E11621 Type 2 diabetes mellitus with foot ulcer: Secondary | ICD-10-CM | POA: Diagnosis not present

## 2017-09-05 DIAGNOSIS — I10 Essential (primary) hypertension: Secondary | ICD-10-CM | POA: Diagnosis not present

## 2017-09-05 DIAGNOSIS — I7389 Other specified peripheral vascular diseases: Secondary | ICD-10-CM | POA: Diagnosis not present

## 2017-09-05 DIAGNOSIS — Z6841 Body Mass Index (BMI) 40.0 and over, adult: Secondary | ICD-10-CM | POA: Diagnosis not present

## 2017-09-07 ENCOUNTER — Encounter: Payer: PPO | Admitting: Surgery

## 2017-09-07 DIAGNOSIS — G603 Idiopathic progressive neuropathy: Secondary | ICD-10-CM | POA: Diagnosis not present

## 2017-09-07 DIAGNOSIS — L97522 Non-pressure chronic ulcer of other part of left foot with fat layer exposed: Secondary | ICD-10-CM | POA: Diagnosis not present

## 2017-09-09 NOTE — Progress Notes (Signed)
[name], Elivia I. ([id number]) Visit Report for 09/07/2017 Arrival Information Details Patient Name: [name], Yvette I. Date of Service: 09/07/2017 10:00 AM Medical Record Number: [id number] Patient Account Number: [id number] Date of Birth/Sex: March 06, 1951 (66 y.o. Female) Treating RN: Curtis Sitesorthy, Joanna Primary Care Alanya Vukelich: Jarome MatinPATERSON, DANIEL Other Clinician: Referring Shakeila Pfarr: Jarome MatinPATERSON, DANIEL Treating Zamorah Ailes/Extender: Rudene ReBritto, Errol Weeks in Treatment: 6 Visit Information History Since Last Visit Added or deleted any medications: No Patient Arrived: Cane Any new allergies or adverse reactions: No Arrival Time: 09:53 Had a fall or experienced change in No Accompanied By: self activities of daily living that may affect Transfer Assistance: None risk of falls: Patient Identification Verified: Yes Signs or symptoms of abuse/neglect since last visito No Secondary Verification Process Yes Hospitalized since last visit: No Completed: Has Dressing in Place as Prescribed: Yes Patient Has Alerts: Yes Pain Present Now: No Patient Alerts: Patient on Blood Thinner Eliquis Electronic Signature(s) Signed: 09/07/2017 4:52:42 PM By: Curtis Sitesorthy, Joanna Entered By: Curtis Sitesorthy, Joanna on 09/07/2017 09:53:26 [name], Yvette I. ([id number]) -------------------------------------------------------------------------------- Encounter Discharge Information Details Patient Name: [name], Yvette I. Date of Service: 09/07/2017 10:00 AM Medical Record Number: [id number] Patient Account Number: [id number] Date of Birth/Sex: March 06, 1951 (66 y.o. Female) Treating RN: Curtis Sitesorthy, Joanna Primary Care Lucio Litsey: Jarome MatinPATERSON, DANIEL Other Clinician: Referring Ria Redcay: Jarome MatinPATERSON, DANIEL Treating Edmundo Tedesco/Extender: Rudene ReBritto, Errol Weeks in Treatment: 6 Encounter Discharge Information Items Discharge Pain Level: 0 Discharge Condition: Stable Ambulatory Status: Cane Discharge Destination: Home Transportation: Private  Auto Accompanied By: self Schedule Follow-up Appointment: Yes Medication Reconciliation completed and No provided to Patient/Care Freddy Kinne: Provided on Clinical Summary of Care: 09/07/2017 Form Type Recipient Paper Patient CS Electronic Signature(s) Signed: 09/07/2017 4:52:42 PM By: Curtis Sitesorthy, Joanna Entered By: Curtis Sitesorthy, Joanna on 09/07/2017 13:10:15 [name], [name] I. ([id number]) -------------------------------------------------------------------------------- Lower Extremity Assessment Details Patient Name: [name], Yvette I. Date of Service: 09/07/2017 10:00 AM Medical Record Number: [id number] Patient Account Number: [id number] Date of Birth/Sex: March 06, 1951 (66 y.o. Female) Treating RN: Curtis Sitesorthy, Joanna Primary Care Fatumata Kashani: Jarome MatinPATERSON, DANIEL Other Clinician: Referring Bayden Gil: Jarome MatinPATERSON, DANIEL Treating Junah Yam/Extender: Rudene ReBritto, Errol Weeks in Treatment: 6 Vascular Assessment Pulses: Dorsalis Pedis Palpable: [Left:Yes] Posterior Tibial Palpable: [Left:Yes] Extremity colors, hair growth, and conditions: Extremity Color: [Left:Normal] Hair Growth on Extremity: [Left:No] Temperature of Extremity: [Left:Warm] Capillary Refill: [Left:< 3 seconds] Electronic Signature(s) Signed: 09/07/2017 4:52:42 PM By: Curtis Sitesorthy, Joanna Entered By: Curtis Sitesorthy, Joanna on 09/07/2017 10:06:06 [name], Yvette I. ([id number]) -------------------------------------------------------------------------------- Multi Wound Chart Details Patient Name: [name], Yvette I. Date of Service: 09/07/2017 10:00 AM Medical Record Number: [id number] Patient Account Number: [id number] Date of Birth/Sex: March 06, 1951 (66 y.o. Female) Treating RN: Curtis Sitesorthy, Joanna Primary Care Avon Molock: Jarome MatinPATERSON, DANIEL Other Clinician: Referring Jmya Uliano: Jarome MatinPATERSON, DANIEL Treating Perina Salvaggio/Extender: Rudene ReBritto, Errol Weeks in Treatment: 6 Vital Signs Height(in): 64 Pulse(bpm): 82 Weight(lbs): 262 Blood Pressure(mmHg): 115/67 Body Mass  Index(BMI): 45 Temperature(F): 98.1 Respiratory Rate 18 (breaths/min): Photos: [1:No Photos] [2:No Photos] [N/A:N/A] Wound Location: [1:Left Metatarsal head first - Plantar] [2:Left Metatarsal head fifth - Plantar] [N/A:N/A] Wounding Event: [1:Gradually Appeared] [2:Trauma] [N/A:N/A] 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] [2:Neuropathic Ulcer-Non Diabetic] [N/A:N/A] Comorbid History: [1:Arrhythmia, Hypertension, Neuropathy] [2:Arrhythmia, Hypertension, Neuropathy] [N/A:N/A] Date Acquired: [1:01/22/2017] [2:05/27/2017] [N/A:N/A] Weeks of Treatment: [1:6] [2:6] [N/A:N/A] Wound Status: [1:Open] [2:Open] [N/A:N/A] Measurements L x W x D [1:0.4x0.4x0.3] [2:0.6x0.7x0.1] [N/A:N/A] (cm) Area (cm) : [1:0.126] [2:0.33] [N/A:N/A] Volume (cm) : [1:0.038] [2:0.033] [N/A:N/A] % Reduction in Area: [1:0.00%] [2:67.70%] [N/A:N/A] % Reduction in Volume: [1:39.70%] [2:67.60%] [N/A:N/A] Classification: [1:Full Thickness With Exposed Support Structures] [2:Full Thickness With Exposed Support Structures] [N/A:N/A] Exudate Amount: [1:Large] [2:Large] [N/A:N/A] Exudate Type: [  1:Serosanguineous] [2:Serosanguineous] [N/A:N/A] Exudate Color: [1:red, brown] [2:red, brown] [N/A:N/A] Wound Margin: [1:Distinct, outline attached] [2:Distinct, outline attached] [N/A:N/A] Granulation Amount: [1:Large (67-100%)] [2:Large (67-100%)] [N/A:N/A] Granulation Quality: [1:Red] [2:Red, Hyper-granulation] [N/A:N/A] Necrotic Amount: [1:Small (1-33%)] [2:Small (1-33%)] [N/A:N/A] Necrotic Tissue: [1:Eschar, Adherent Slough] [2:Adherent Slough] [N/A:N/A] Exposed Structures: [1:Fat Layer (Subcutaneous Tissue) Exposed: Yes Fascia: No Tendon: No Muscle: No Joint: No Bone: No] [2:Fat Layer (Subcutaneous Tissue) Exposed: Yes Fascia: No Tendon: No Muscle: No Joint: No Bone: No] [N/A:N/A] Epithelialization: [1:None] [2:None] [N/A:N/A] Debridement: [1:Debridement ([id number]) 10:15] [2:Debridement ([id number]) 10:17]  [N/A:N/A N/A] Pre-procedure Verification/Time Out Taken: Pain Control: Lidocaine 4% Topical Solution Lidocaine 4% Topical Solution N/A Tissue Debrided: Fibrin/Slough, Callus, Fibrin/Slough, Callus, N/A Subcutaneous Subcutaneous Level: Skin/Subcutaneous Tissue Skin/Subcutaneous Tissue N/A Debridement Area (sq cm): 0.16 0.42 N/A Instrument: Forceps, Scissors Forceps, Scissors N/A Bleeding: Minimum Minimum N/A Hemostasis Achieved: Pressure Pressure N/A Procedural Pain: 0 0 N/A Post Procedural Pain: 0 0 N/A Debridement Treatment Procedure was tolerated well Procedure was tolerated well N/A Response: Post Debridement 0.5x0.5x0.3 0.8x0.7x0.1 N/A Measurements L x W x D (cm) Post Debridement Volume: 0.059 0.044 N/A (cm) Periwound Skin Texture: Callus: Yes Callus: Yes N/A Excoriation: No Excoriation: No Induration: No Induration: No Crepitus: No Crepitus: No Rash: No Rash: No Scarring: No Scarring: No Periwound Skin Moisture: Maceration: No Maceration: No N/A Dry/Scaly: No Dry/Scaly: No Periwound Skin Color: Atrophie Blanche: No Atrophie Blanche: No N/A Cyanosis: No Cyanosis: No Ecchymosis: No Ecchymosis: No Erythema: No Erythema: No Hemosiderin Staining: No Hemosiderin Staining: No Mottled: No Mottled: No Pallor: No Pallor: No Rubor: No Rubor: No Temperature: No Abnormality No Abnormality N/A Tenderness on Palpation: Yes Yes N/A Wound Preparation: Ulcer Cleansing: Ulcer Cleansing: N/A Rinsed/Irrigated with Saline Rinsed/Irrigated with Saline Topical Anesthetic Applied: Topical Anesthetic Applied: Other: lidocaine 4% Other: lidocaine 4% Procedures Performed: Debridement Debridement N/A Treatment Notes Electronic Signature(s) Signed: 09/07/2017 10:32:56 AM By: Evlyn Kanner MD, FACS Entered By: Evlyn Kanner on 09/07/2017 10:32:56 Yvette Shutter  ([id number]) -------------------------------------------------------------------------------- Multi-Disciplinary Care Plan Details Patient Name: Yvette Shutter Date of Service: 09/07/2017 10:00 AM Medical Record Number: [id number] Patient Account Number: [id number] Date of Birth/Sex: [birth year] (66 y.o. Female) Treating RN: Curtis Sites Primary Care Chari Parmenter: Jarome Matin Other Clinician: Referring Lidiya Reise: Jarome Matin Treating Damel Querry/Extender: Rudene Re in Treatment: 6 Active Inactive ` Abuse / Safety / Falls / Self Care Management Nursing Diagnoses: Potential for falls Goals: Patient will not experience any injury related to falls Date Initiated: 07/24/2017 Target Resolution Date: 09/30/2017 Goal Status: Active Interventions: Assess fall risk on admission and as needed Notes: ` Orientation to the Wound Care Program Nursing Diagnoses: Knowledge deficit related to the wound healing center program Goals: Patient/caregiver will verbalize understanding of the Wound Healing Center Program Date Initiated: 07/24/2017 Target Resolution Date: 09/30/2017 Goal Status: Active Interventions: Provide education on orientation to the wound center Notes: ` Wound/Skin Impairment Nursing Diagnoses: Impaired tissue integrity Goals: Ulcer/skin breakdown will heal within 14 weeks Date Initiated: 07/24/2017 Target Resolution Date: 09/30/2017 Goal Status: Active Interventions: SYBILLA, MALHOTRA IMarland Kitchen ([id number]) Assess patient/caregiver ability to obtain necessary supplies Assess patient/caregiver ability to perform ulcer/skin care regimen upon admission and as needed Assess ulceration(s) every visit Notes: Electronic Signature(s) Signed: 09/07/2017 4:52:42 PM By: Curtis Sites Entered By: Curtis Sites on 09/07/2017 10:14:54 Yvette Shutter ([id number]) -------------------------------------------------------------------------------- Pain Assessment  Details Patient Name: Yvette Shutter Date of Service: 09/07/2017 10:00 AM Medical Record Number: [id number] Patient Account Number: [id number] Date of Birth/Sex: [birth year] (66 y.o. Female) Treating RN: Curtis Sites Primary Care  Grover Robinson: Jarome MatinPATERSON, DANIEL Other Clinician: Referring Yanisa Goodgame: Jarome MatinPATERSON, DANIEL Treating Taneasha Fuqua/Extender: Rudene ReBritto, Errol Weeks in Treatment: 6 Active Problems Location of Pain Severity and Description of Pain Patient Has Paino No Site Locations Pain Management and Medication Current Pain Management: Electronic Signature(s) Signed: 09/07/2017 4:52:42 PM By: Curtis Sitesorthy, Joanna Entered By: Curtis Sitesorthy, Joanna on 09/07/2017 09:53:44 [name], Mayli I. ([id number]) -------------------------------------------------------------------------------- Patient/Caregiver Education Details Patient Name: [name], Adna I. Date of Service: 09/07/2017 10:00 AM Medical Record Number: [id number] Patient Account Number: [id number] Date of Birth/Gender: 1950/11/20 (66 y.o. Female) Treating RN: Curtis Sitesorthy, Joanna Primary Care Physician: Jarome MatinPATERSON, DANIEL Other Clinician: Referring Physician: Jarome MatinPATERSON, DANIEL Treating Physician/Extender: Rudene ReBritto, Errol Weeks in Treatment: 6 Education Assessment Education Provided To: Patient Education Topics Provided Wound/Skin Impairment: Handouts: Other: wound care as ordered Methods: Demonstration, Explain/Verbal Responses: State content correctly Electronic Signature(s) Signed: 09/07/2017 4:52:42 PM By: Curtis Sitesorthy, Joanna Entered By: Curtis Sitesorthy, Joanna on 09/07/2017 13:10:42 [name], Regan I. ([id number]) -------------------------------------------------------------------------------- Wound Assessment Details Patient Name: [name], Jasmaine I. Date of Service: 09/07/2017 10:00 AM Medical Record Number: [id number] Patient Account Number: [id number] Date of Birth/Sex: 1950/11/20 (66 y.o. Female) Treating RN: Curtis Sitesorthy, Joanna Primary Care Dashanna Kinnamon:  Jarome MatinPATERSON, DANIEL Other Clinician: Referring Noa Constante: Jarome MatinPATERSON, DANIEL Treating Yuliet Needs/Extender: Rudene ReBritto, Errol Weeks in Treatment: 6 Wound Status Wound Number: 1 Primary Etiology: Neuropathic Ulcer-Non Diabetic Wound Location: Left Metatarsal head first - Plantar Wound Status: Open Wounding Event: Gradually Appeared Comorbid History: Arrhythmia, Hypertension, Neuropathy Date Acquired: 01/22/2017 Weeks Of Treatment: 6 Clustered Wound: No Wound Measurements Length: (cm) 0.4 Width: (cm) 0.4 Depth: (cm) 0.3 Area: (cm) 0.126 Volume: (cm) 0.038 % Reduction in Area: 0% % Reduction in Volume: 39.7% Epithelialization: None Tunneling: No Undermining: No Wound Description Full Thickness With Exposed Support Classification: Structures Wound Margin: Distinct, outline attached Exudate Large Amount: Exudate Type: Serosanguineous Exudate Color: red, brown Foul Odor After Cleansing: No Slough/Fibrino Yes Wound Bed Granulation Amount: Large (67-100%) Exposed Structure Granulation Quality: Red Fascia Exposed: No Necrotic Amount: Small (1-33%) Fat Layer (Subcutaneous Tissue) Exposed: Yes Necrotic Quality: Eschar, Adherent Slough Tendon Exposed: No Muscle Exposed: No Joint Exposed: No Bone Exposed: No Periwound Skin Texture Texture Color No Abnormalities Noted: No No Abnormalities Noted: No Callus: Yes Atrophie Blanche: No Crepitus: No Cyanosis: No Excoriation: No Ecchymosis: No Induration: No Erythema: No Rash: No Hemosiderin Staining: No Scarring: No Mottled: No Pallor: No Moisture Rubor: No No Abnormalities Noted: No Dry / Scaly: No Temperature / Pain Masih, Christell I. ([id number]) Maceration: No Temperature: No Abnormality Tenderness on Palpation: Yes Wound Preparation Ulcer Cleansing: Rinsed/Irrigated with Saline Topical Anesthetic Applied: Other: lidocaine 4%, Treatment Notes Wound #1 (Left, Plantar Metatarsal head first) 1. Cleansed with: Clean  wound with Normal Saline 2. Anesthetic Topical Lidocaine 4% cream to wound bed prior to debridement 4. Dressing Applied: Other dressing (specify in notes) 5. Secondary Dressing Applied Gauze and Kerlix/Conform 6. Footwear/Offloading device applied Felt/Foam Wedge shoe 7. Secured with Tape Notes silvercel Electronic Signature(s) Signed: 09/07/2017 4:52:42 PM By: Curtis Sitesorthy, Joanna Entered By: Curtis Sitesorthy, Joanna on 09/07/2017 10:06:31 [name], Elanie I. ([id number]) -------------------------------------------------------------------------------- Wound Assessment Details Patient Name: [name], Stuart I. Date of Service: 09/07/2017 10:00 AM Medical Record Number: [id number] Patient Account Number: [id number] Date of Birth/Sex: 1950/11/20 (66 y.o. Female) Treating RN: Curtis Sitesorthy, Joanna Primary Care Willia Lampert: Jarome MatinPATERSON, DANIEL Other Clinician: Referring Boysie Bonebrake: Jarome MatinPATERSON, DANIEL Treating Evelio Rueda/Extender: Rudene ReBritto, Errol Weeks in Treatment: 6 Wound Status Wound Number: 2 Primary Etiology: Neuropathic Ulcer-Non Diabetic Wound Location: Left Metatarsal head fifth - Plantar Wound Status: Open Wounding Event: Trauma Comorbid History: Arrhythmia, Hypertension, Neuropathy Date Acquired: 05/27/2017  Weeks Of Treatment: 6 Clustered Wound: No Wound Measurements Length: (cm) 0.6 Width: (cm) 0.7 Depth: (cm) 0.1 Area: (cm) 0.33 Volume: (cm) 0.033 % Reduction in Area: 67.7% % Reduction in Volume: 67.6% Epithelialization: None Tunneling: No Undermining: No Wound Description Full Thickness With Exposed Support Classification: Structures Wound Margin: Distinct, outline attached Exudate Large Amount: Exudate Type: Serosanguineous Exudate Color: red, brown Foul Odor After Cleansing: No Slough/Fibrino Yes Wound Bed Granulation Amount: Large (67-100%) Exposed Structure Granulation Quality: Red, Hyper-granulation Fascia Exposed: No Necrotic Amount: Small (1-33%) Fat Layer (Subcutaneous  Tissue) Exposed: Yes Necrotic Quality: Adherent Slough Tendon Exposed: No Muscle Exposed: No Joint Exposed: No Bone Exposed: No Periwound Skin Texture Texture Color No Abnormalities Noted: No No Abnormalities Noted: No Callus: Yes Atrophie Blanche: No Crepitus: No Cyanosis: No Excoriation: No Ecchymosis: No Induration: No Erythema: No Rash: No Hemosiderin Staining: No Scarring: No Mottled: No Pallor: No Moisture Rubor: No No Abnormalities Noted: No Dry / Scaly: No Temperature / Pain Rutigliano, Khalessi I. ([id number]) Maceration: No Temperature: No Abnormality Tenderness on Palpation: Yes Wound Preparation Ulcer Cleansing: Rinsed/Irrigated with Saline Topical Anesthetic Applied: Other: lidocaine 4%, Treatment Notes Wound #2 (Left, Plantar Metatarsal head fifth) 1. Cleansed with: Clean wound with Normal Saline 2. Anesthetic Topical Lidocaine 4% cream to wound bed prior to debridement 4. Dressing Applied: Other dressing (specify in notes) 5. Secondary Dressing Applied Gauze and Kerlix/Conform 6. Footwear/Offloading device applied Felt/Foam Wedge shoe 7. Secured with Tape Notes silvercel Electronic Signature(s) Signed: 09/07/2017 4:52:42 PM By: Curtis Sites Entered By: Curtis Sites on 09/07/2017 10:13:47 Yvette Shutter ([id number]) -------------------------------------------------------------------------------- Vitals Details Patient Name: Yvette Shutter Date of Service: 09/07/2017 10:00 AM Medical Record Number: [id number] Patient Account Number: [id number] Date of Birth/Sex: 1951-02-13 (66 y.o. Female) Treating RN: Curtis Sites Primary Care Shaily Librizzi: Jarome Matin Other Clinician: Referring Lindy Garczynski: Jarome Matin Treating Satomi Buda/Extender: Rudene Re in Treatment: 6 Vital Signs Time Taken: 09:53 Temperature (F): 98.1 Height (in): 64 Pulse (bpm): 82 Weight (lbs): 262 Respiratory Rate (breaths/min): 18 Body Mass Index  (BMI): 45 Blood Pressure (mmHg): 115/67 Reference Range: 80 - 120 mg / dl Electronic Signature(s) Signed: 09/07/2017 4:52:42 PM By: Curtis Sites Entered By: Curtis Sites on 09/07/2017 09:57:05

## 2017-09-11 NOTE — Progress Notes (Signed)
[name], Ivania I. ([id number]) Visit Report for 09/07/2017 Chief Complaint Document Details Patient Name: [name], Yvette I. Date of Service: 09/07/2017 10:00 AM Medical Record Number: [id number] Patient Account Number: [id number] Date of Birth/Sex: [birth year] (66 y.o. Female) Treating RN: Curtis Sitesorthy, Joanna Primary Care Provider: Jarome MatinPATERSON, DANIEL Other Clinician: Referring Provider: Jarome MatinPATERSON, DANIEL Treating Provider/Extender: Rudene ReBritto, Myleen Brailsford Weeks in Treatment: 6 Information Obtained from: Patient Chief Complaint Patient presents to the wound care center for a consult due non healing wound to the left plantar foot which she's had on and off for a number of years Electronic Signature(s) Signed: 09/07/2017 10:33:36 AM By: Evlyn KannerBritto, Vickee Mormino MD, FACS Entered By: Evlyn KannerBritto, Ouita Nish on 09/07/2017 10:33:36 [name], Alaiya I. ([id number]) -------------------------------------------------------------------------------- Debridement Details Patient Name: [name], Yvette I. Date of Service: 09/07/2017 10:00 AM Medical Record Number: [id number] Patient Account Number: [id number] Date of Birth/Sex: [birth year] (66 y.o. Female) Treating RN: Curtis Sitesorthy, Joanna Primary Care Provider: Jarome MatinPATERSON, DANIEL Other Clinician: Referring Provider: Jarome MatinPATERSON, DANIEL Treating Provider/Extender: Rudene ReBritto, Zeniah Briney Weeks in Treatment: 6 Debridement Performed for Wound #1 Left,Plantar Metatarsal head first Assessment: Performed By: Physician Evlyn KannerBritto, Theo Reither, MD Debridement: Debridement Pre-procedure Verification/Time Yes - 10:15 Out Taken: Start Time: 10:15 Pain Control: Lidocaine 4% Topical Solution Level: Skin/Subcutaneous Tissue Total Area Debrided (L x W): 0.4 (cm) x 0.4 (cm) = 0.16 (cm) Tissue and other material Viable, Non-Viable, Callus, Fibrin/Slough, Subcutaneous debrided: Instrument: Forceps, Scissors Bleeding: Minimum Hemostasis Achieved: Pressure End Time: 10:17 Procedural Pain: 0 Post Procedural Pain:  0 Response to Treatment: Procedure was tolerated well Post Debridement Measurements of Total Wound Length: (cm) 0.5 Width: (cm) 0.5 Depth: (cm) 0.3 Volume: (cm) 0.059 Character of Wound/Ulcer Post Debridement: Improved Post Procedure Diagnosis Same as Pre-procedure Electronic Signature(s) Signed: 09/07/2017 10:33:23 AM By: Evlyn KannerBritto, Barre Aydelott MD, FACS Signed: 09/07/2017 4:52:42 PM By: Curtis Sitesorthy, Joanna Entered By: Evlyn KannerBritto, Paisely Brick on 09/07/2017 10:33:22 [name], Yvette I. ([id number]) -------------------------------------------------------------------------------- Debridement Details Patient Name: [name], Yvette I. Date of Service: 09/07/2017 10:00 AM Medical Record Number: [id number] Patient Account Number: [id number] Date of Birth/Sex: [birth year] (66 y.o. Female) Treating RN: Curtis Sitesorthy, Joanna Primary Care Provider: Jarome MatinPATERSON, DANIEL Other Clinician: Referring Provider: Jarome MatinPATERSON, DANIEL Treating Provider/Extender: Rudene ReBritto, Abou Sterkel Weeks in Treatment: 6 Debridement Performed for Wound #2 Left,Plantar Metatarsal head fifth Assessment: Performed By: Physician Evlyn KannerBritto, Hitoshi Werts, MD Debridement: Debridement Pre-procedure Verification/Time Yes - 10:17 Out Taken: Start Time: 10:17 Pain Control: Lidocaine 4% Topical Solution Level: Skin/Subcutaneous Tissue Total Area Debrided (L x W): 0.6 (cm) x 0.7 (cm) = 0.42 (cm) Tissue and other material Viable, Non-Viable, Callus, Fibrin/Slough, Subcutaneous debrided: Instrument: Forceps, Scissors Bleeding: Minimum Hemostasis Achieved: Pressure End Time: 10:19 Procedural Pain: 0 Post Procedural Pain: 0 Response to Treatment: Procedure was tolerated well Post Debridement Measurements of Total Wound Length: (cm) 0.8 Width: (cm) 0.7 Depth: (cm) 0.1 Volume: (cm) 0.044 Character of Wound/Ulcer Post Debridement: Improved Post Procedure Diagnosis Same as Pre-procedure Electronic Signature(s) Signed: 09/07/2017 10:33:29 AM By: Evlyn KannerBritto, Luree Palla MD,  FACS Signed: 09/07/2017 4:52:42 PM By: Curtis Sitesorthy, Joanna Entered By: Evlyn KannerBritto, Paola Flynt on 09/07/2017 10:33:29 [name], Yvette I. ([id number]) -------------------------------------------------------------------------------- HPI Details Patient Name: [name], Yvette I. Date of Service: 09/07/2017 10:00 AM Medical Record Number: [id number] Patient Account Number: [id number] Date of Birth/Sex: [birth year] (66 y.o. Female) Treating RN: Curtis Sitesorthy, Joanna Primary Care Provider: Jarome MatinPATERSON, DANIEL Other Clinician: Referring Provider: Jarome MatinPATERSON, DANIEL Treating Provider/Extender: Rudene ReBritto, Izabella Marcantel Weeks in Treatment: 6 History of Present Illness Location: if plantar foot in the region of the first metatarsal head and the fifth metatarsal head Quality: Patient reports No Pain. Severity: Patient states wound are getting worse.  Duration: Patient has had the wound for > 3 months prior to seeking treatment at the wound center Context: The wound would happen gradually Modifying Factors: Other treatment(s) tried include:is been under the care of [facility] orthopedics Associated Signs and Symptoms: Patient reports having increase swelling. HPI Description: 66 year old patient referred to was from [facility] orthopedics where she was seen by Dr. Laverta Baltimore team saw her for a left foot ulcer which was there for about 4 months. She was seen in the emergency room on 06/05/2017 and they did a MRI which ruled out osteomyelitis and they placed her on antibiotics, which was Keflex for 3 weeks. She was found to have plantar ulcers in the region of the great toe and on the fifth metatarsal head. 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 on examination the patient was found to have 2 ulcers on the plantar aspect of the left forefoot the one on the fifth metatarsal  head was 2-1/2 cm in diameter and down to the muscle. There was a lot of hypertrophic granulation tissue. It did not probe down to bone. Note the patient was seen in July of this year by Dr. 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 She was recommended a hallux MP joint arthrodesis to correct the bunion and anti-tic conditions and she would weight-bear in a cam boot postoperatively. Her most recent MRI done on September 12 -- IMPRESSION: Skin wound at the fifth MTP joint without underlying abscess, septic joint or osteomyelitis.Hallux valgus and first MTP osteoarthritis. Midfoot degenerative change also noted. 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 Right toe brachial index of 0.52 and left of 0.4 suggestive of abnormal arterial flow at rest. left flow was monophasic on the dorsalis pedis and biphasic on the right side. 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 08/10/2017 -- she has upcoming appointments to see Dr. Victorino Dike and also to see the vascular surgeons at [facility]. Other than that she is doing well and being compliant with a dressing. 08/14/2017 -- she saw Dr. Victorino Dike earlier this morning and his note is pending. She has not yet had her appointment at the vascular office as yet. 08/21/2017 -- we have not yet received the consultation note from Dr. Victorino Dike and how vascular appointment is later this week. 08/28/17 on evaluation today patient appears to be doing fairly well in regard to the ulcers on her left lower extremity. She did have her arterial  study with the following results. According to left arterial duplex study patient's left lower extremity arterial system is patent without evidence of hemodynamically significant stenosis. Peak systolic velocity of the bed superficial femoral artery is suggestive of a 30-49% stenosis with wall irregularity however the ratio is less than 1.5 Fortunately, she tells me that she does have an appointment scheduled for January 8 Lauren arteriogram as well to further evaluate this. No fevers, chills, nausea, or vomiting noted at this time. Overall she is pleased with how things have been progressing. 09/07/2017 -- --  was seen by Dr. Durene Cal, who reviewed her vascular lab studies which showed ABI of 0.73 on the left and 1.02 on the right and monophasic waveforms throughout the left leg with no significant stenosis. Toe pressure was 50 on Mutchler, Jerrica I. ([id number]) the left. He has recommended proceeding with angiography and this has been scheduled for early January as per the patientos request. Electronic Signature(s) Signed: 09/07/2017 10:33:43 AM By: Evlyn Kanner MD, FACS Previous Signature: 09/07/2017 10:04:44 AM Version By: Evlyn Kanner MD, FACS Previous Signature: 09/07/2017 10:00:17 AM Version By: Evlyn Kanner MD, FACS Entered By: Evlyn Kanner on 09/07/2017 10:33:43 [name] ([id number]) -------------------------------------------------------------------------------- Physical Exam Details Patient Name: [name] Date of Service: 09/07/2017 10:00 AM Medical Record Number: [id number] Patient Account Number: [id number] Date of Birth/Sex: June 16, 1951 (66 y.o. Female) Treating RN: Curtis Sites Primary Care Provider: Jarome Matin Other Clinician: Referring Provider: Jarome Matin Treating Provider/Extender: Rudene Re in Treatment: 6 Constitutional . Pulse regular. Respirations normal and unlabored. Afebrile. . Eyes Nonicteric. Reactive to  light. Ears, Nose, Mouth, and Throat Lips, teeth, and gums WNL.Marland Kitchen Moist mucosa without lesions. Neck supple and nontender. No palpable supraclavicular or cervical adenopathy. Normal sized without goiter. Respiratory WNL. No retractions.. Cardiovascular Pedal Pulses WNL. No clubbing, cyanosis or edema. Lymphatic No adneopathy. No adenopathy. No adenopathy. Musculoskeletal Adexa without tenderness or enlargement.. Digits and nails w/o clubbing, cyanosis, infection, petechiae, ischemia, or inflammatory conditions.. Integumentary (Hair, Skin) No suspicious lesions. No crepitus or fluctuance. No peri-wound warmth or erythema. No masses.Marland Kitchen Psychiatric Judgement and insight Intact.. No evidence of depression, anxiety, or agitation.. Notes both the wounds on her left plantar foot needed sharp debridement to saucerized them and remove some of the subcutaneous debris. It does not probe down to bone. Minimal bleeding controlled with pressure. Electronic Signature(s) Signed: 09/07/2017 10:34:31 AM By: Evlyn Kanner MD, FACS Entered By: Evlyn Kanner on 09/07/2017 10:34:30 [name] ([id number]) -------------------------------------------------------------------------------- Physician Orders Details Patient Name: [name] Date of Service: 09/07/2017 10:00 AM Medical Record Number: [id number] Patient Account Number: [id number] Date of Birth/Sex: May 22, 1951 (66 y.o. Female) Treating RN: Curtis Sites Primary Care Provider: Jarome Matin Other Clinician: Referring Provider: Jarome Matin Treating Provider/Extender: Rudene Re in Treatment: 6 Verbal / Phone Orders: No Diagnosis Coding Wound Cleansing Wound #1 Left,Plantar Metatarsal head first o Clean wound with Normal Saline. o May Shower, gently pat wound dry prior to applying new dressing. Wound #2 Left,Plantar Metatarsal head fifth o Clean wound with Normal Saline. o May Shower, gently pat wound  dry prior to applying new dressing. Primary Wound Dressing Wound #1 Left,Plantar Metatarsal head first o Silvercel Non-Adherent Wound #2 Left,Plantar Metatarsal head fifth o [place]cel Non-Adherent Secondary Dressing Wound #1 Left,Plantar Metatarsal head first o Gauze and Kerlix/Conform Wound #2 Left,Plantar Metatarsal head fifth o Gauze and Kerlix/Conform Dressing Change Frequency Wound #1 Left,Plantar Metatarsal head first o Change dressing every other day. Wound #2 Left,Plantar Metatarsal head fifth o Change dressing every other day. Follow-up Appointments o Other: - When you return from your trip Edema Control Wound #1 Left,Plantar Metatarsal head first o Elevate legs to the level of the heart and pump ankles as often as possible Wound #2 Left,Plantar Metatarsal head fifth o Elevate legs to the level of the heart and pump ankles as often as possible Off-Loading Wound #1 Left,Plantar Metatarsal head first o Open toe surgical shoe to: - left foot, felt Vu, Jayra I. ([id number]) Wound #2 Left,Plantar Metatarsal head fifth o Open toe surgical shoe  to: - left foot, felt Additional Orders / Instructions Wound #1 Left,Plantar Metatarsal head first o Increase protein intake. Wound #2 Left,Plantar Metatarsal head fifth o Increase protein intake. Electronic Signature(s) Signed: 09/07/2017 3:15:43 PM By: Evlyn Kanner MD, FACS Signed: 09/07/2017 4:52:42 PM By: Curtis Sites Entered By: Curtis Sites on 09/07/2017 10:21:20 [name] ([id number]) -------------------------------------------------------------------------------- Problem List Details Patient Name: [name] Date of Service: 09/07/2017 10:00 AM Medical Record Number: [id number] Patient Account Number: [id number] Date of Birth/Sex: 07/18/1951 (66 y.o. 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 Female) Treating RN: Curtis Sites Primary Care Provider: Jarome Matin Other Clinician: Referring Provider: Jarome Matin Treating Provider/Extender: Rudene Re in Treatment: 6 Subjective Chief Complaint Information obtained from Patient Patient presents to the wound care center for a consult due non healing wound to the left plantar foot which she's had on and off for a number of years History of Present Illness (HPI) The following HPI elements were documented for the patient's wound: Location: if plantar foot in the region of the first metatarsal head and the fifth metatarsal head Quality: Patient reports No Pain. Severity: Patient states wound are getting worse. Duration: Patient has had the wound for > 3 months prior to seeking treatment at the wound center Context: The wound would happen gradually Modifying Factors: Other treatment(s) tried include:is been under the care of [facility] orthopedics Associated Signs and Symptoms: Patient reports having  increase swelling. 66 year old patient referred to was from [facility] orthopedics where she was seen by Dr. Laverta Baltimore team saw her for a left foot ulcer which was there for about 4 months. She was seen in the emergency room on 06/05/2017 and they did a MRI which ruled out osteomyelitis and they placed her on antibiotics, which was Keflex for 3 weeks. She was found to have plantar ulcers in the region of the great toe and on the fifth metatarsal head. 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 on examination the patient was found to have 2 ulcers on the plantar aspect of the left forefoot the one on the fifth metatarsal head was 2-1/2 cm in diameter and down to the muscle. There was a lot of hypertrophic granulation tissue. It did not probe down to bone. Note the patient was seen in July of this year by Dr. 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 She was recommended a hallux MP joint arthrodesis to correct the bunion and anti-tic conditions and she would weight-bear in a cam boot postoperatively. Her most recent MRI done on  September 12 -- IMPRESSION: Skin wound at the fifth MTP joint without underlying abscess, septic joint or osteomyelitis.Hallux valgus and first MTP osteoarthritis. Midfoot degenerative change also noted. 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 Right toe brachial index of 0.52 and left of 0.4 suggestive of abnormal arterial flow at rest. left flow was monophasic on the dorsalis pedis and biphasic on the right side. 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 08/10/2017 -- she has upcoming appointments to see Dr. Victorino Dike and also to see the vascular surgeons at [facility]. Other than that she is doing well and being compliant with a dressing. 08/14/2017 -- she saw Dr. Victorino Dike earlier this morning and his note is pending. She has not yet had her appointment at the vascular office as yet. 08/21/2017 -- we have not yet received the consultation note from Dr. Victorino Dike and how vascular appointment is later this week. 08/28/17 on evaluation today patient appears to be doing fairly well in regard to the ulcers on her left lower extremity. She did have her arterial study with the following results. According to left arterial duplex study patient's left lower extremity arterial system is patent without evidence of Mcgriff, Mercia I. ([id number]) hemodynamically significant stenosis. Peak systolic velocity of the bed superficial femoral artery is suggestive of a 30-49% stenosis with wall irregularity however the ratio is less than 1.5 Fortunately, she tells me that she does have an appointment scheduled for January 8 Lauren arteriogram as well to further evaluate this. No fevers, chills, nausea, or vomiting noted at this time. Overall she is pleased with how things have been progressing. 09/07/2017 -- -- was seen by Dr. Durene Cal, who reviewed her vascular lab studies which showed ABI of 0.73 on the left and 1.02 on the right and monophasic waveforms throughout the left leg with no significant stenosis. Toe pressure was 50 on the left. He has recommended proceeding with angiography and this has been scheduled for early January as per the patient s request. Patient History Information obtained from Patient. Social History Former smoker - quit 25 years ago,  Marital Status - Single, Alcohol Use - Never, Drug Use - No History, Caffeine Use - Daily. Objective Constitutional Pulse regular. Respirations normal and unlabored. Afebrile. Vitals Time Taken: 9:53 AM, Height: 64 in, Weight: 262 lbs, BMI: 45, Temperature: 98.1 F, Pulse: 82 bpm, Respiratory Rate: 18 breaths/min, Blood Pressure: 115/67 mmHg. Eyes Nonicteric. Reactive to light. Ears, Nose, Mouth, and Throat Lips, teeth, and gums WNL.Marland Kitchen Moist mucosa without lesions. Neck supple and nontender. No palpable supraclavicular or cervical adenopathy. Normal sized without goiter. Respiratory WNL. No retractions.. Cardiovascular Pedal Pulses WNL. No clubbing, cyanosis or edema. Lymphatic No adneopathy. No adenopathy. No adenopathy. Musculoskeletal Adexa without tenderness or enlargement.. Digits and nails w/o clubbing, cyanosis, infection, petechiae, ischemia, or inflammatory conditions.[name] ([id number]) Psychiatric Judgement and insight Intact.. No evidence of depression, anxiety, or agitation.. General Notes: both the wounds on her left plantar foot needed sharp debridement to saucerized them and remove some of the subcutaneous debris. It does not probe down to bone. Minimal bleeding controlled with pressure. Integumentary (Hair, Skin) No suspicious lesions. No crepitus or fluctuance. No peri-wound warmth or erythema. No masses.. Wound #1 status is Open.  Original cause of wound was Gradually Appeared. The wound is located on the Left,Plantar Metatarsal head first. The wound measures 0.4cm length x 0.4cm width x 0.3cm depth; 0.126cm^2 area and 0.038cm^3 volume. There is Fat Layer (Subcutaneous Tissue) Exposed exposed. There is no tunneling or undermining noted. There is a large amount of serosanguineous drainage noted. The wound margin is distinct with the outline attached to the wound base. There is large (67-100%) red granulation within the wound bed. There is a small (1-33%)  amount of necrotic tissue within the wound bed including Eschar and Adherent Slough. The periwound skin appearance exhibited: Callus. The periwound skin appearance did not exhibit: Crepitus, Excoriation, Induration, Rash, Scarring, Dry/Scaly, Maceration, Atrophie Blanche, Cyanosis, Ecchymosis, Hemosiderin Staining, Mottled, Pallor, Rubor, Erythema. Periwound temperature was noted as No Abnormality. The periwound has tenderness on palpation. Wound #2 status is Open. Original cause of wound was Trauma. The wound is located on the Left,Plantar Metatarsal head fifth. The wound measures 0.6cm length x 0.7cm width x 0.1cm depth; 0.33cm^2 area and 0.033cm^3 volume. There is Fat Layer (Subcutaneous Tissue) Exposed exposed. There is no tunneling or undermining noted. There is a large amount of serosanguineous drainage noted. The wound margin is distinct with the outline attached to the wound base. There is large (67-100%) red, hyper - granulation within the wound bed. There is a small (1-33%) amount of necrotic tissue within the wound bed including Adherent Slough. The periwound skin appearance exhibited: Callus. The periwound skin appearance did not exhibit: Crepitus, Excoriation, Induration, Rash, Scarring, Dry/Scaly, Maceration, Atrophie Blanche, Cyanosis, Ecchymosis, Hemosiderin Staining, Mottled, Pallor, Rubor, Erythema. Periwound temperature was noted as No Abnormality. The periwound has tenderness on palpation. 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 There was a Skin/Subcutaneous Tissue Debridement ([id number]) debridement with  total area of 0.16 sq cm performed by Evlyn Kanner, MD. with the following instrument(s): Forceps and Scissors to remove Viable and Non-Viable tissue/material including Fibrin/Slough, Callus, and Subcutaneous after achieving pain control using Lidocaine 4% Topical Solution. A time out was conducted at 10:15, prior to the start of the procedure. A Minimum amount of bleeding was controlled with Pressure. The procedure was tolerated well with a pain level of 0 throughout and a pain level of 0 following the procedure. Post TIFFINE, HENIGAN I. ([id number]) Debridement Measurements: 0.5cm length x 0.5cm width x 0.3cm depth; 0.059cm^3 volume. Character of Wound/Ulcer Post Debridement is improved. 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 There was a Skin/Subcutaneous Tissue Debridement ([id number]) debridement with total area of 0.42 sq cm performed by Evlyn Kanner, MD. with the following instrument(s): Forceps and Scissors to remove Viable and Non-Viable tissue/material including Fibrin/Slough, Callus, and Subcutaneous after achieving pain control using Lidocaine 4% Topical Solution. A time out was conducted at 10:17, prior to the start of the procedure. A Minimum amount of bleeding was controlled with Pressure. The procedure was tolerated well with a pain level of 0 throughout and a pain level of 0 following the procedure. Post Debridement Measurements: 0.8cm length x 0.7cm width x 0.1cm depth; 0.044cm^3 volume. Character of Wound/Ulcer Post Debridement  is improved. Post procedure Diagnosis Wound #2: Same as Pre-Procedure Plan Wound Cleansing: Wound #1 Left,Plantar Metatarsal head first: Clean wound with Normal Saline. May Shower, gently pat wound dry prior to applying new dressing. Wound #2 Left,Plantar Metatarsal head fifth: Clean wound with  Normal Saline. May Shower, gently pat wound dry prior to applying new dressing. Primary Wound Dressing: Wound #1 Left,Plantar Metatarsal head first: Silvercel Non-Adherent Wound #2 Left,Plantar Metatarsal head fifth: Silvercel Non-Adherent Secondary Dressing: Wound #1 Left,Plantar Metatarsal head first: Gauze and Kerlix/Conform Wound #2 Left,Plantar Metatarsal head fifth: Gauze and Kerlix/Conform Dressing Change Frequency: Wound #1 Left,Plantar Metatarsal head first: Change dressing every other day. Wound #2 Left,Plantar Metatarsal head fifth: Change dressing every other day. Follow-up Appointments: Other: - When you return from your trip Edema Control: Wound #1 Left,Plantar Metatarsal head first: Elevate legs to the level of the heart and pump ankles as often as possible Wound #2 Left,Plantar Metatarsal head fifth: Elevate legs to the level of the heart and pump ankles as often as possible Off-Loading: Wound #1 Left,Plantar Metatarsal head first: Open toe surgical shoe to: - left foot, felt Wound #2 Left,Plantar Metatarsal head fifth: Open toe surgical shoe to: - left foot, felt Additional Orders / Instructions: Wound #1 Left,Plantar Metatarsal head first: EMBERLIN, VERNER I. ([id number]) Increase protein intake. Wound #2 Left,Plantar Metatarsal head fifth: Increase protein intake. I have reviewed a vascular note and after sharp debridement and review today, I have recommended: 1. Silver alginate and an offloading felt and to wear good offloading shoes as prescribed by her orthopedic physicians 2. will await her vascular procedure to be done on January 8 3. Adequate protein, vitamin A, vitamin C and zinc 4. to see Dr. Victorino Dike in follow-up 5. Regular visits the wound center, after she is back from her vacation and has a vascular procedure She has had several questions which have been answered to her satisfaction. Electronic Signature(s) Signed: 09/07/2017 10:35:50 AM By:  Evlyn Kanner MD, FACS Entered By: Evlyn Kanner on 09/07/2017 10:35:50 [name] ([id number]) -------------------------------------------------------------------------------- ROS/PFSH Details Patient Name: [name] Date of Service: 09/07/2017 10:00 AM Medical Record Number: [id number] Patient Account Number: [id number] Date of Birth/Sex: 22-Feb-1951 (66 y.o. Female) Treating RN: Curtis Sites Primary Care Provider: Jarome Matin Other Clinician: Referring Provider: Jarome Matin Treating Provider/Extender: Rudene Re in Treatment: 6 Information Obtained From Patient Wound History Do you currently have one or more open woundso Yes How many open wounds do you currently haveo 2 Approximately how long have you had your woundso 8 weeks How have you been treating your wound(s) until nowo hydrogel, Has your wound(s) ever healed and then re-openedo No Have you had any lab work done in the past montho Yes Who ordered the lab work doneo [facility] Have you tested positive for an antibiotic resistant organism (MRSA, VRE)o No Have you tested positive for osteomyelitis (bone infection)o No Have you had any tests for circulation on your legso Yes Who ordered the testo hospital Where was the test doneo [facility] Have you had other problems associated with your woundso Swelling Cardiovascular Medical History: Positive for: Arrhythmia - a fib; Hypertension Neurologic Medical History: Positive for: Neuropathy Immunizations Pneumococcal Vaccine: Received Pneumococcal Vaccination: No Implantable Devices Family and Social History Former smoker - quit 25 years ago; Marital Status - Single; Alcohol Use: Never; Drug Use: No History; Caffeine Use: Daily; Financial Concerns: No; Food, Clothing or Shelter Needs: No; Support System Lacking: No; Transportation Concerns: No; Advanced Directives: No; Patient does not want  information on Advanced Directives Physician Affirmation I  have reviewed and agree with the above information. Electronic Signature(s) Signed: 09/07/2017 3:15:43 PM By: Evlyn Kanner MD, FACS Signed: 09/07/2017 4:52:42 PM By: Curtis Sites Entered By: Evlyn Kanner on 09/07/2017 10:33:51 [name] ([id number]) [name] ([id number]) -------------------------------------------------------------------------------- SuperBill Details Patient Name: [name] Date of Service: 09/07/2017 Medical Record Number: [id number] Patient Account Number: [id number] Date of Birth/Sex: Feb 14, 1951 (66 y.o. 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 Morbid (severe) obesity due to excess calories Facility Procedures CPT4 Code: [id number] Description: 11042 - DEB SUBQ TISSUE 20 SQ CM/< ICD-10 Diagnosis Description L97.522 Non-pressure chronic ulcer of other part of left foot with fat I73.9 Peripheral vascular disease, unspecified 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 E66.01 Morbid  (severe) obesity due to excess calories Modifier: layer exposed Quantity: 1 Physician Procedures CPT4 Code: [id number] Description: 99213 - WC PHYS LEVEL 3 - EST PT ICD-10 Diagnosis Description L97.522 Non-pressure chronic ulcer of other part of left foot with fat I73.9 Peripheral vascular disease, unspecified 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

## 2017-10-02 ENCOUNTER — Telehealth: Payer: Self-pay | Admitting: *Deleted

## 2017-10-02 ENCOUNTER — Other Ambulatory Visit: Payer: Self-pay | Admitting: *Deleted

## 2017-10-02 NOTE — Telephone Encounter (Signed)
Patient took her Eliquis and procedure canceled for 10/03/17. [facility] called and patient will call back to reschedule.

## 2017-10-03 ENCOUNTER — Ambulatory Visit (HOSPITAL_COMMUNITY): Admission: RE | Admit: 2017-10-03 | Payer: PPO | Source: Ambulatory Visit | Admitting: Surgery

## 2017-10-03 ENCOUNTER — Encounter (HOSPITAL_COMMUNITY): Admission: RE | Payer: Self-pay | Source: Ambulatory Visit

## 2017-10-03 SURGERY — ABDOMINAL AORTOGRAM W/LOWER EXTREMITY
Anesthesia: LOCAL

## 2017-10-05 ENCOUNTER — Encounter: Payer: PPO | Attending: Physician Assistant | Admitting: Physician Assistant

## 2017-10-05 DIAGNOSIS — I10 Essential (primary) hypertension: Secondary | ICD-10-CM | POA: Diagnosis not present

## 2017-10-05 DIAGNOSIS — F319 Bipolar disorder, unspecified: Secondary | ICD-10-CM | POA: Insufficient documentation

## 2017-10-05 DIAGNOSIS — I4891 Unspecified atrial fibrillation: Secondary | ICD-10-CM | POA: Insufficient documentation

## 2017-10-05 DIAGNOSIS — L97512 Non-pressure chronic ulcer of other part of right foot with fat layer exposed: Secondary | ICD-10-CM | POA: Diagnosis not present

## 2017-10-05 DIAGNOSIS — Z87891 Personal history of nicotine dependence: Secondary | ICD-10-CM | POA: Diagnosis not present

## 2017-10-05 DIAGNOSIS — I739 Peripheral vascular disease, unspecified: Secondary | ICD-10-CM | POA: Insufficient documentation

## 2017-10-05 DIAGNOSIS — L97522 Non-pressure chronic ulcer of other part of left foot with fat layer exposed: Secondary | ICD-10-CM | POA: Insufficient documentation

## 2017-10-05 DIAGNOSIS — Z6841 Body Mass Index (BMI) 40.0 and over, adult: Secondary | ICD-10-CM | POA: Insufficient documentation

## 2017-10-05 DIAGNOSIS — G603 Idiopathic progressive neuropathy: Secondary | ICD-10-CM | POA: Insufficient documentation

## 2017-10-05 DIAGNOSIS — M2022 Hallux rigidus, left foot: Secondary | ICD-10-CM | POA: Insufficient documentation

## 2017-10-07 NOTE — Progress Notes (Signed)
Yvette, Orozco ([id number]) Visit Report for 10/05/2017 Arrival Information Details Patient Name: [name], [name] Date of Service: 10/05/2017 10:15 AM Medical Record Number: [id number] Patient Account Number: [id number] Date of Birth/Sex: 08/14/1951 (66 y.o. Female) Treating RN: Curtis Sites Primary Care Paxten Appelt: Jarome Matin Other Clinician: Referring Karel Mowers: Jarome Matin Treating Tynlee Bayle/Extender: Linwood Dibbles, HOYT Weeks in Treatment: 10 Visit Information History Since Last Visit Added or deleted any medications: No Patient Arrived: Cane Any new allergies or adverse reactions: No Arrival Time: 10:33 Had a fall or experienced change in No Accompanied By: self activities of daily living that may affect Transfer Assistance: None risk of falls: Patient Identification Verified: Yes Signs or symptoms of abuse/neglect since last visito No Secondary Verification Process Yes Hospitalized since last visit: No Completed: Has Dressing in Place as Prescribed: Yes Patient Has Alerts: Yes Pain Present Now: No Patient Alerts: Patient on Blood Thinner Eliquis Electronic Signature(s) Signed: 10/05/2017 3:54:03 PM By: Curtis Sites Entered By: Curtis Sites on 10/05/2017 10:33:42 [name] ([id number]) -------------------------------------------------------------------------------- Encounter Discharge Information Details Patient Name: [name] Date of Service: 10/05/2017 10:15 AM Medical Record Number: [id number] Patient Account Number: [id number] Date of Birth/Sex: 25-May-1951 (66 y.o. Female) Treating RN: Curtis Sites Primary Care Marieelena Bartko: Jarome Matin Other Clinician: Referring Zanai Mallari: Jarome Matin Treating Zadok Holaway/Extender: Linwood Dibbles, HOYT Weeks in Treatment: 10 Encounter Discharge Information Items Discharge Pain Level: 0 Discharge Condition: Stable Ambulatory Status: Ambulatory Discharge Destination: Home Transportation: Private  Auto Accompanied By: self Schedule Follow-up Appointment: Yes Medication Reconciliation completed and No provided to Patient/Care Jayle Solarz: Provided on Clinical Summary of Care: 10/05/2017 Form Type Recipient Paper Patient CS Electronic Signature(s) Signed: 10/06/2017 10:03:49 AM By: Gwenlyn Perking Entered By: Gwenlyn Perking on 10/05/2017 11:34:39 [name] ([id number]) -------------------------------------------------------------------------------- Lower Extremity Assessment Details Patient Name: [name] Date of Service: 10/05/2017 10:15 AM Medical Record Number: [id number] Patient Account Number: [id number] Date of Birth/Sex: [birth year] (66 y.o. Female) Treating RN: Curtis Sites Primary Care Ilyana Manuele: Jarome Matin Other Clinician: Referring Malikiah Debarr: Jarome Matin Treating Saahil Herbster/Extender: Linwood Dibbles, HOYT Weeks in Treatment: 10 Vascular Assessment Pulses: Dorsalis Pedis Palpable: [Left:Yes] Posterior Tibial Extremity colors, hair growth, and conditions: Extremity Color: [Left:Normal] Hair Growth on Extremity: [Left:No] Temperature of Extremity: [Left:Warm] Capillary Refill: [Left:< 3 seconds] Electronic Signature(s) Signed: 10/05/2017 3:54:03 PM By: Curtis Sites Entered By: Curtis Sites on 10/05/2017 10:45:41 [name] ([id number]) -------------------------------------------------------------------------------- Multi Wound Chart Details Patient Name: [name] Date of Service: 10/05/2017 10:15 AM Medical Record Number: [id number] Patient Account Number: [id number] Date of Birth/Sex: 1951-06-16 (66 y.o. 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] [2:Neuropathic Ulcer-Non Diabetic] [N/A:N/A] Comorbid History: [1:Arrhythmia, Hypertension, Neuropathy] [2:Arrhythmia, Hypertension, Neuropathy] [N/A:N/A] Date Acquired: [1:01/22/2017] [2:05/27/2017] [N/A:N/A] Weeks of Treatment: [1:10] [2:10] [N/A:N/A] Wound Status: [1:Open] [2:Open] [N/A:N/A] Measurements L x W x D [1:0.4x0.5x0.4] [2:0.5x0.5x0.1] [N/A:N/A] (cm) Area (cm) : [1:0.157] [2:0.196] [N/A:N/A] Volume (cm) : [1:0.063] [2:0.02] [N/A:N/A] % Reduction in Area: [1:-24.60%] [2:80.80%] [N/A:N/A] % Reduction in Volume: [1:0.00%] [2:80.40%] [N/A:N/A] Classification: [1:Full Thickness With Exposed Support Structures] [2:Full Thickness With Exposed Support Structures] [N/A:N/A] Exudate Amount: [1:Large] [2:Large] [N/A:N/A]  Exudate Type: [1:Serosanguineous] [2:Serosanguineous] [N/A:N/A] Exudate Color: [1:red, brown] [2:red, brown] [N/A:N/A] Wound Margin: [1:Distinct, outline attached] [2:Distinct, outline attached] [N/A:N/A] Granulation Amount: [1:Large (67-100%)] [2:Large (67-100%)] [N/A:N/A] Granulation Quality: [1:Red] [2:Red, Hyper-granulation] [N/A:N/A] Necrotic Amount: [1:Small (1-33%)] [2:Small (1-33%)] [N/A:N/A] Necrotic Tissue: [1:Eschar, Adherent Slough] [2:Adherent Slough] [N/A:N/A] Exposed Structures: [1:Fat Layer (Subcutaneous Tissue) Exposed: Yes Fascia: No Tendon: No Muscle: No Joint: No Bone: No] [2:Fat Layer (Subcutaneous Tissue) Exposed: Yes Fascia: No Tendon: No Muscle: No Joint: No Bone: No] [N/A:N/A] Epithelialization: [1:None] [2:None] [N/A:N/A] Periwound Skin Texture: [1:Callus: Yes Excoriation: No] [2:Callus: Yes Excoriation: No]  [N/A:N/A] Induration: No Induration: No Crepitus: No Crepitus: No Rash: No Rash: No Scarring: No Scarring: No Periwound Skin Moisture: Maceration: No Maceration: No N/A Dry/Scaly: No Dry/Scaly: No Periwound Skin Color: Atrophie Blanche: No Atrophie Blanche: No N/A Cyanosis: No Cyanosis: No Ecchymosis: No Ecchymosis: No Erythema: No Erythema: No Hemosiderin Staining: No Hemosiderin Staining: No Mottled: No Mottled: No Pallor: No Pallor: No Rubor: No Rubor: No Temperature: No Abnormality No Abnormality N/A Tenderness on Palpation: Yes Yes N/A Wound Preparation: Ulcer Cleansing: Ulcer Cleansing: N/A Rinsed/Irrigated with Saline Rinsed/Irrigated with Saline Topical Anesthetic Applied: Topical Anesthetic Applied: Other: lidocaine 4% Other: lidocaine 4% Treatment Notes Electronic Signature(s) Signed: 10/05/2017 3:54:03 PM By: Curtis Sites Entered By: Curtis Sites on 10/05/2017 10:46:05 [name] ([id number]) -------------------------------------------------------------------------------- Multi-Disciplinary Care Plan Details Patient Name: [name] Date of Service: 10/05/2017 10:15 AM Medical Record Number: [id number] Patient Account Number: [id number] Date of Birth/Sex: March 05, 1951 (66 y.o. Female) Treating RN: Curtis Sites Primary Care Beck Cofer: Jarome Matin Other Clinician: Referring Melaina Howerton: Jarome Matin Treating Costella Schwarz/Extender: Linwood Dibbles, HOYT Weeks in Treatment: 10 Active Inactive ` Abuse / Safety / Falls / Self Care Management Nursing Diagnoses: Potential for falls Goals: Patient will not experience any injury related to falls Date Initiated: 07/24/2017 Target Resolution Date: 09/30/2017 Goal Status: Active Interventions: Assess fall risk on admission and as needed Notes: ` Orientation to the Wound Care Program Nursing Diagnoses: Knowledge deficit related to the wound healing center  program Goals: Patient/caregiver will verbalize understanding of the Wound Healing Center Program Date Initiated: 07/24/2017 Target Resolution Date: 09/30/2017 Goal Status: Active Interventions: Provide education on orientation to the wound center Notes: ` Wound/Skin Impairment Nursing Diagnoses: Impaired tissue integrity Goals: Ulcer/skin breakdown will heal within 14 weeks Date Initiated: 07/24/2017 Target Resolution Date: 09/30/2017 Goal Status: Active Interventions: TALYAH, SEDER IMarland Kitchen ([id number]) Assess patient/caregiver ability to obtain necessary supplies Assess patient/caregiver ability to perform ulcer/skin care regimen upon admission and as needed Assess ulceration(s) every visit Notes: Electronic Signature(s) Signed: 10/05/2017 3:54:03 PM By: Curtis Sites Entered By: Curtis Sites on 10/05/2017 10:45:49 [name] ([id number]) -------------------------------------------------------------------------------- Pain Assessment Details Patient Name: [name] Date of Service: 10/05/2017 10:15 AM Medical Record Number: [id number] Patient Account Number: [id number] Date of Birth/Sex: Oct 16, 1950 (66 y.o. Female) Treating RN: Curtis Sites Primary Care Deboraha Goar: Jarome Matin Other Clinician: Referring Cayenne Breault: Jarome Matin Treating Zalyn Amend/Extender: Linwood Dibbles, HOYT Weeks in Treatment: 10 Active Problems Location of Pain Severity and Description of Pain Patient Has Paino No Site Locations Pain Management and Medication Current Pain Management: Notes Topical or injectable lidocaine is offered to patient for acute pain when surgical debridement is performed. If needed, Patient is instructed to use over the counter pain medication for the following 24-48 hours after debridement. Wound care MDs do not prescribed pain medications. Patient has chronic pain or uncontrolled pain. Patient has been instructed to make an appointment with their Primary  Care Physician for pain  management. Electronic Signature(s) Signed: 10/05/2017 3:54:03 PM By: Curtis Sites Entered By: Curtis Sites on 10/05/2017 10:34:40 [name] ([id number]) -------------------------------------------------------------------------------- Patient/Caregiver Education Details Patient Name: [name] Date of Service: 10/05/2017 10:15 AM Medical Record Number: [id number] Patient Account Number: [id number] Date of Birth/Gender: 02-Apr-1951 (66 y.o. Female) Treating RN: Curtis Sites Primary Care Physician: Jarome Matin Other Clinician: Referring Physician: Jarome Matin Treating Physician/Extender: Skeet Simmer in Treatment: 10 Education Assessment Education Provided To: Patient Education Topics Provided Wound/Skin Impairment: Handouts: Caring for Your Ulcer, Other: change dressing as ordered Methods: Demonstration, Explain/Verbal Responses: State content correctly Electronic Signature(s) Signed: 10/05/2017 3:54:03 PM By: Curtis Sites Entered By: Curtis Sites on 10/05/2017 11:29:01 [name] ([id number]) -------------------------------------------------------------------------------- Wound Assessment Details Patient Name: [name] Date of Service: 10/05/2017 10:15 AM Medical Record Number: [id number] Patient Account Number: [id number] Date of Birth/Sex: [birth year] (66 y.o. Female) Treating RN: Curtis Sites Primary Care Aseneth Hack: Jarome Matin Other Clinician: Referring Balian Schaller: Jarome Matin Treating Lexxus Underhill/Extender: STONE III, HOYT Weeks in Treatment: 10 Wound Status Wound Number: 1 Primary Etiology: Neuropathic Ulcer-Non Diabetic Wound Location: Left Metatarsal head first - Plantar Wound Status: Open Wounding Event: Gradually Appeared Comorbid History: Arrhythmia, Hypertension, Neuropathy Date Acquired: 01/22/2017 Weeks Of Treatment: 10 Clustered Wound: No Photos Photo Uploaded By:  Curtis Sites on 10/05/2017 14:17:23 Wound Measurements Length: (cm) 0.4 Width: (cm) 0.5 Depth: (cm) 0.4 Area: (cm) 0.157 Volume: (cm) 0.063 % Reduction in Area: -24.6% % Reduction in Volume: 0% Epithelialization: None Tunneling: No Undermining: No Wound Description Full Thickness With Exposed Support Classification: Structures Wound Margin: Distinct, outline attached Exudate Large Amount: Exudate Type: Serosanguineous Exudate Color: red, brown Foul Odor After Cleansing: No Slough/Fibrino Yes Wound Bed Granulation Amount: Large (67-100%) Exposed Structure Granulation Quality: Red Fascia Exposed: No Necrotic Amount: Small (1-33%) Fat Layer (Subcutaneous Tissue) Exposed: Yes Necrotic Quality: Eschar, Adherent Slough Tendon Exposed: No Muscle Exposed: No Joint Exposed: No Bone Exposed: No Orozco, Yvette I. ([id number]) Periwound Skin Texture Texture Color No Abnormalities Noted: No No Abnormalities Noted: No Callus: Yes Atrophie Blanche: No Crepitus: No Cyanosis: No Excoriation: No Ecchymosis: No Induration: No Erythema: No Rash: No Hemosiderin Staining: No Scarring: No Mottled: No Pallor: No Moisture Rubor: No No Abnormalities Noted: No Dry / Scaly: No Temperature / Pain Maceration: No Temperature: No Abnormality Tenderness on Palpation: Yes Wound Preparation Ulcer Cleansing: Rinsed/Irrigated with Saline Topical Anesthetic Applied: Other: lidocaine 4%, Treatment Notes Wound #1 (Left, Plantar Metatarsal head first) 1. Cleansed with: Clean wound with Normal Saline 2. Anesthetic Topical Lidocaine 4% cream to wound bed prior to debridement 4. Dressing Applied: Other dressing (specify in notes) 5. Secondary Dressing Applied Dry Gauze Kerlix/Conform 6. Footwear/Offloading device applied Felt/Foam 7. Secured with Tape Notes silvercel Electronic Signature(s) Signed: 10/05/2017 3:54:03 PM By: Curtis Sites Entered By: Curtis Sites on  10/05/2017 10:44:59 [name] ([id number]) -------------------------------------------------------------------------------- Wound Assessment Details Patient Name: [name] Date of Service: 10/05/2017 10:15 AM Medical Record Number: [id number] Patient Account Number: [id number] Date of Birth/Sex: [birth year] (66 y.o. Female) Treating RN: Curtis Sites Primary Care Emiliano Welshans: Jarome Matin Other Clinician: Referring Onedia Vargus: Jarome Matin Treating Patric Vanpelt/Extender: STONE III, HOYT Weeks in Treatment: 10 Wound Status Wound Number: 2 Primary Etiology: Neuropathic Ulcer-Non Diabetic Wound Location: Left Metatarsal head fifth - Plantar Wound Status: Open Wounding Event: Trauma Comorbid History: Arrhythmia, Hypertension, Neuropathy Date Acquired: 05/27/2017 Weeks Of Treatment: 10 Clustered Wound: No Photos Photo Uploaded By: Curtis Sites on 10/05/2017 14:17:24 Wound Measurements Length: (cm) 0.5 Width: (cm) 0.5 Depth: (  cm) 0.1 Area: (cm) 0.196 Volume: (cm) 0.02 % Reduction in Area: 80.8% % Reduction in Volume: 80.4% Epithelialization: None Tunneling: No Undermining: No Wound Description Full Thickness With Exposed Support Classification: Structures Wound Margin: Distinct, outline attached Exudate Large Amount: Exudate Type: Serosanguineous Exudate Color: red, brown Foul Odor After Cleansing: No Slough/Fibrino Yes Wound Bed Granulation Amount: Large (67-100%) Exposed Structure Granulation Quality: Red, Hyper-granulation Fascia Exposed: No Necrotic Amount: Small (1-33%) Fat Layer (Subcutaneous Tissue) Exposed: Yes Necrotic Quality: Adherent Slough Tendon Exposed: No Muscle Exposed: No Joint Exposed: No Bone Exposed: No Orozco, Yvette I. ([id number]) Periwound Skin Texture Texture Color No Abnormalities Noted: No No Abnormalities Noted: No Callus: Yes Atrophie Blanche: No Crepitus: No Cyanosis: No Excoriation: No Ecchymosis:  No Induration: No Erythema: No Rash: No Hemosiderin Staining: No Scarring: No Mottled: No Pallor: No Moisture Rubor: No No Abnormalities Noted: No Dry / Scaly: No Temperature / Pain Maceration: No Temperature: No Abnormality Tenderness on Palpation: Yes Wound Preparation Ulcer Cleansing: Rinsed/Irrigated with Saline Topical Anesthetic Applied: Other: lidocaine 4%, Treatment Notes Wound #2 (Left, Plantar Metatarsal head fifth) 1. Cleansed with: Clean wound with Normal Saline 2. Anesthetic Topical Lidocaine 4% cream to wound bed prior to debridement 4. Dressing Applied: Other dressing (specify in notes) 5. Secondary Dressing Applied Dry Gauze Kerlix/Conform 6. Footwear/Offloading device applied Felt/Foam 7. Secured with Tape Notes silvercel Electronic Signature(s) Signed: 10/05/2017 3:54:03 PM By: Curtis Sitesorthy, Joanna Entered By: Curtis Sitesorthy, Joanna on 10/05/2017 10:45:18 Yvette ShutterSUTPHEN, Yvette I. ([id number]) -------------------------------------------------------------------------------- Wound Assessment Details Patient Name: Yvette ShutterSUTPHEN, Yvette I. Date of Service: 10/05/2017 10:15 AM Medical Record Number: [id number] Patient Account Number: [id number] Date of Birth/Sex: 05-31-1951 (66 y.o. Female) Treating RN: Curtis Sitesorthy, Joanna Primary Care Laketta Soderberg: Jarome MatinPATERSON, DANIEL Other Clinician: Referring Brentin Shin: Jarome MatinPATERSON, DANIEL Treating Tina Temme/Extender: STONE III, HOYT Weeks in Treatment: 10 Wound Status Wound Number: 3 Primary Etiology: Neuropathic Ulcer-Non Diabetic Wound Location: Right Metatarsal head first - Plantar Wound Status: Open Wounding Event: Gradually Appeared Comorbid History: Arrhythmia, Hypertension, Neuropathy Date Acquired: 10/05/2017 Weeks Of Treatment: 0 Clustered Wound: No Photos Photo Uploaded By: Curtis Sitesorthy, Joanna on 10/05/2017 14:17:50 Wound Measurements Length: (cm) 0.8 Width: (cm) 0.1 Depth: (cm) 0.2 Area: (cm) 0.063 Volume: (cm) 0.013 % Reduction in  Area: % Reduction in Volume: Epithelialization: None Tunneling: No Undermining: No Wound Description Full Thickness With Exposed Support Classification: Structures Wound Margin: Flat and Intact Exudate Medium Amount: Exudate Type: Serous Exudate Color: amber Foul Odor After Cleansing: No Slough/Fibrino No Wound Bed Granulation Amount: Large (67-100%) Exposed Structure Granulation Quality: Pink Fascia Exposed: No Necrotic Amount: Small (1-33%) Fat Layer (Subcutaneous Tissue) Exposed: Yes Necrotic Quality: Adherent Slough Tendon Exposed: No Muscle Exposed: No Joint Exposed: No Bone Exposed: No [name], Yvette I. ([id number]) Periwound Skin Texture Texture Color No Abnormalities Noted: No No Abnormalities Noted: No Callus: Yes Atrophie Blanche: No Crepitus: No Cyanosis: No Excoriation: No Ecchymosis: No Induration: No Erythema: No Rash: No Hemosiderin Staining: No Scarring: No Mottled: No Pallor: No Moisture Rubor: No No Abnormalities Noted: No Dry / Scaly: No Temperature / Pain Maceration: No Temperature: No Abnormality Tenderness on Palpation: Yes Wound Preparation Ulcer Cleansing: Rinsed/Irrigated with Saline Topical Anesthetic Applied: Other: lidocaine 4%, Treatment Notes Wound #3 (Right, Plantar Metatarsal head first) 1. Cleansed with: Clean wound with Normal Saline 2. Anesthetic Topical Lidocaine 4% cream to wound bed prior to debridement 4. Dressing Applied: Other dressing (specify in notes) 5. Secondary Dressing Applied Dry Gauze Kerlix/Conform 6. Footwear/Offloading device applied Felt/Foam 7. Secured with Tape Notes silvercel Electronic Signature(s) Signed: 10/05/2017 3:54:03 PM  By: Curtis Sites Entered By: Curtis Sites on 10/05/2017 11:15:45 [name] ([id number]) -------------------------------------------------------------------------------- Vitals Details Patient Name: [name] Date of Service:  10/05/2017 10:15 AM Medical Record Number: [id number] Patient Account Number: [id number] Date of Birth/Sex: 1951-05-23 (66 y.o. Female) Treating RN: Curtis Sites Primary Care Athol Bolds: Jarome Matin Other Clinician: Referring Umberto Pavek: Jarome Matin Treating Davonta Stroot/Extender: Linwood Dibbles, HOYT Weeks in Treatment: 10 Vital Signs Time Taken: 10:34 Temperature (F): 98.3 Height (in): 64 Pulse (bpm): 60 Weight (lbs): 262 Respiratory Rate (breaths/min): 18 Body Mass Index (BMI): 45 Blood Pressure (mmHg): 136/82 Reference Range: 80 - 120 mg / dl Electronic Signature(s) Signed: 10/05/2017 3:54:03 PM By: Curtis Sites Entered By: Curtis Sites on 10/05/2017 10:36:41

## 2017-10-08 NOTE — Progress Notes (Addendum)
[name], [name] ([id number]) Visit Report for 10/05/2017 Chief Complaint Document Details Patient Name: [name], [name] Date of Service: 10/05/2017 10:15 AM Medical Record Number: [id number] Patient Account Number: [id number] Date of Birth/Sex: Feb 03, 1951 (67 y.o. Female) Treating RN: Curtis Sites Primary Care Provider: Jarome Matin Other Clinician: Referring Provider: Jarome Matin Treating Provider/Extender: Linwood Dibbles, HOYT Weeks in Treatment: 10 Information Obtained from: Patient Chief Complaint Patient presents to the wound care center for a consult due non healing wound to the left plantar foot which she's had on and off for a number of years Electronic Signature(s) Signed: 10/06/2017 8:05:24 AM By: Lenda Kelp PA-C Entered By: Lenda Kelp on 10/05/2017 10:53:16 [name] ([id number]) -------------------------------------------------------------------------------- Debridement Details Patient Name: [name] Date of Service: 10/05/2017 10:15 AM Medical Record Number: [id number] Patient Account Number: [id number] Date of Birth/Sex: [birth year] (66 y.o. Female) Treating RN: Curtis Sites Primary Care Provider: Jarome Matin Other Clinician: Referring Provider: Jarome Matin Treating Provider/Extender: Linwood Dibbles, HOYT Weeks in Treatment: 10 Debridement Performed for Wound #2 Left,Plantar Metatarsal head fifth Assessment: Performed By: Physician STONE III, HOYT E., PA-C Debridement: Debridement Pre-procedure Verification/Time Yes - 11:01 Out Taken: Start Time: 11:01 Pain Control: Lidocaine 4% Topical Solution Level: Skin/Subcutaneous Tissue Total Area Debrided (L x W): 0.5 (cm) x 0.5 (cm) = 0.25 (cm) Tissue and other material Viable, Non-Viable, Callus, Fibrin/Slough, Subcutaneous debrided: Instrument: Curette Bleeding: Minimum Hemostasis Achieved: Pressure End Time: 11:04 Procedural Pain: 0 Post Procedural Pain:  0 Response to Treatment: Procedure was tolerated well Post Debridement Measurements of Total Wound Length: (cm) 0.6 Width: (cm) 0.6 Depth: (cm) 0.2 Volume: (cm) 0.057 Character of Wound/Ulcer Post Debridement: Improved Post Procedure Diagnosis Same as Pre-procedure Electronic Signature(s) Signed: 10/05/2017 3:54:03 PM By: Curtis Sites Signed: 10/06/2017 8:05:24 AM By: Lenda Kelp PA-C Entered By: Curtis Sites on 10/05/2017 11:07:16 [name] ([id number]) -------------------------------------------------------------------------------- Debridement Details Patient Name: [name] Date of Service: 10/05/2017 10:15 AM Medical Record Number: [id number] Patient Account Number: [id number] Date of Birth/Sex: 11/21/1950 (66 y.o. Female) Treating RN: Curtis Sites Primary Care Provider: Jarome Matin Other Clinician: Referring Provider: Jarome Matin Treating Provider/Extender: Linwood Dibbles, HOYT Weeks in Treatment: 10 Debridement Performed for Wound #1 Left,Plantar Metatarsal head first Assessment: Performed By: Physician STONE III, HOYT E., PA-C Debridement: Debridement Pre-procedure Verification/Time Yes - 11:04 Out Taken: Start Time: 11:04 Pain Control: Lidocaine 4% Topical Solution Level: Skin/Subcutaneous Tissue Total Area Debrided (L x W): 0.4 (cm) x 0.5 (cm) = 0.2 (cm) Tissue and other material Viable, Non-Viable, Callus, Fibrin/Slough, Subcutaneous debrided: Instrument: Curette Bleeding: Minimum Hemostasis Achieved: Pressure End Time: 11:07 Procedural Pain: 0 Post Procedural Pain: 0 Response to Treatment: Procedure was tolerated well Post Debridement Measurements of Total Wound Length: (cm) 0.5 Width: (cm) 0.5 Depth: (cm) 0.3 Volume: (cm) 0.059 Character of Wound/Ulcer Post Debridement: Improved Post Procedure Diagnosis Same as Pre-procedure Electronic Signature(s) Signed: 10/05/2017 3:54:03 PM By: Curtis Sites Signed: 10/06/2017  8:05:24 AM By: Lenda Kelp PA-C Entered By: Curtis Sites on 10/05/2017 11:07:56 [name] ([id number]) -------------------------------------------------------------------------------- Debridement Details Patient Name: [name] Date of Service: 10/05/2017 10:15 AM Medical Record Number: [id number] Patient Account Number: [id number] Date of Birth/Sex: Mar 29, 1951 (66 y.o. Female) Treating RN: Curtis Sites Primary Care Provider: Jarome Matin Other Clinician: Referring Provider: Jarome Matin Treating Provider/Extender: Linwood Dibbles, HOYT Weeks in Treatment: 10 Debridement Performed for Wound #3 Right,Plantar Metatarsal head first Assessment: Performed By: Physician STONE III, HOYT E., PA-C Debridement: Debridement Pre-procedure Verification/Time Yes - 11:09 Out  Taken: Start Time: 11:09 Pain Control: Lidocaine 4% Topical Solution Level: Skin/Subcutaneous Tissue Total Area Debrided (L x W): 0.8 (cm) x 0.1 (cm) = 0.08 (cm) Tissue and other material Viable, Non-Viable, Callus, Fibrin/Slough, Subcutaneous debrided: Instrument: Curette, Forceps, Scissors Bleeding: Minimum Hemostasis Achieved: Pressure End Time: 11:18 Procedural Pain: 0 Post Procedural Pain: 0 Response to Treatment: Procedure was tolerated well Post Debridement Measurements of Total Wound Length: (cm) 0.6 Width: (cm) 0.6 Depth: (cm) 0.1 Volume: (cm) 0.028 Character of Wound/Ulcer Post Debridement: Improved Post Procedure Diagnosis Same as Pre-procedure Electronic Signature(s) Signed: 10/05/2017 3:54:03 PM By: Curtis Sites Signed: 10/06/2017 8:05:24 AM By: Lenda Kelp PA-C Entered By: Curtis Sites on 10/05/2017 11:20:00 [name] ([id number]) -------------------------------------------------------------------------------- HPI Details Patient Name: [name] Date of Service: 10/05/2017 10:15 AM Medical Record Number: [id number] Patient Account Number:  [id number] Date of Birth/Sex: 1951-02-09 (66 y.o. Female) Treating RN: Curtis Sites Primary Care Provider: Jarome Matin Other Clinician: Referring Provider: Jarome Matin Treating Provider/Extender: Linwood Dibbles, HOYT Weeks in Treatment: 10 History of Present Illness Location: if plantar foot in the region of the first metatarsal head and the fifth metatarsal head Quality: Patient reports No Pain. Severity: Patient states wound are getting worse. Duration: Patient has had the wound for > 3 months prior to seeking treatment at the wound center Context: The wound would happen gradually Modifying Factors: Other treatment(s) tried include:is been under the care of [facility] orthopedics Associated Signs and Symptoms: Patient reports having increase swelling. HPI Description: 67 year old patient referred to was from [facility] orthopedics where she was seen by Dr. Laverta Baltimore team saw her for a left foot ulcer which was there for about 4 months. She was seen in the emergency room on 06/05/2017 and they did a MRI which ruled out osteomyelitis and they placed her on antibiotics, which was Keflex for 3 weeks. She was found to have plantar ulcers in the region of the great toe and on the fifth metatarsal head. 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 on examination the patient was found to have 2 ulcers on the plantar aspect of the left forefoot the one on the fifth metatarsal head was 2-1/2 cm in diameter and down to the muscle. There was a lot of hypertrophic granulation tissue. It did not probe down to bone. Note the patient was seen in July of this year by Dr. 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 She  was recommended a hallux MP joint arthrodesis to correct the bunion and anti-tic conditions and she would weight-bear in a cam boot postoperatively. Her most recent MRI done on September 12 -- IMPRESSION: Skin wound at the fifth MTP joint without underlying abscess, septic joint or osteomyelitis.Hallux valgus and first MTP osteoarthritis. Midfoot degenerative change also noted. 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 Right toe brachial index of 0.52 and left of 0.4 suggestive of abnormal arterial flow at rest. left flow was monophasic on the dorsalis pedis and biphasic on the right side. 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 08/10/2017 -- she has upcoming appointments to see Dr. Victorino Dike and also to see the vascular surgeons at [facility]. Other than that she is doing well and being compliant with a dressing. 08/14/2017 -- she saw Dr. Victorino Dike earlier this morning and his note is pending. She has not yet had her appointment at the vascular office as yet. 08/21/2017 -- we have not yet received the consultation note from Dr. Victorino Dike and how vascular appointment is later this week. 08/28/17 on evaluation today patient appears to be doing fairly well in regard to the ulcers on her left lower extremity. She did have her arterial study with the following results. According to left arterial duplex study patient's left lower extremity arterial system is patent without evidence of hemodynamically significant stenosis. Peak systolic velocity of the bed superficial femoral artery is suggestive of a 30-49% stenosis with wall irregularity however the ratio is less than 1.5 Fortunately, she  tells me that she does have an appointment scheduled for January 8 Lauren arteriogram as well to further evaluate this. No fevers, chills, nausea, or vomiting noted at this time. Overall she is pleased with how things have been progressing. 09/07/2017 -- -- was seen by Dr. Durene Cal, who reviewed her vascular lab studies which showed ABI of 0.73 on the left and 1.02 on the right and monophasic waveforms throughout the left leg with no significant stenosis. Toe pressure was 50 on Topp, Matalyn I. ([id number]) the left. He has recommended proceeding with angiography and this has been scheduled for early January as per the patientos request. 10/05/17 on evaluation today patient's wound appears to be doing fairly well at this point. She has been recommended to go forward with an angiogram and that had been rescheduled but at this point in time is actually scheduled for the 15th of this month which is next week. Unfortunately this had to be pushed back. This was due to her blood thinner medication which she thought she needed to stop a different day. Nonetheless her wound does not appear to be too bad in regard to the [facility] surface of the foot is just not showing good signs of improving. Basically she is maintaining. Unfortunately she did have a new area on the right plantar surface of her foot which was incidentally noted by patient and right and suggested to [place] to look at and unfortunately she had a crack callous that revealed a small ulcer underneath. The good news is she has much better blood flow to the site. The bad news is obviously she now has an additional wound. Electronic Signature(s) Signed: 10/06/2017 8:05:24 AM By: Lenda Kelp PA-C Entered By: Lenda Kelp on 10/05/2017 17:51:45 [name] ([id number]) -------------------------------------------------------------------------------- Physical Exam Details Patient Name: [name] Date of Service: 10/05/2017  10:15 AM Medical Record Number: [id number] Patient Account Number: [id number] Date of Birth/Sex: Jan 05, 1951 (66 y.o. Female) Treating RN: Curtis Sites Primary Care Provider: Jarome Matin Other Clinician: Referring Provider: Jarome Matin Treating Provider/Extender: STONE III, HOYT Weeks in Treatment: 10 Constitutional Well-nourished and well-hydrated in no acute distress. Respiratory normal breathing without difficulty. Psychiatric this patient is able to make decisions and demonstrates good insight into disease process. Alert and Oriented x 3. pleasant and cooperative. Notes On evaluation today patient's wound bed does appear to show some callous on the left surrounding and I did work on some of this today. There was some Slough overlying the wound bed which was also  cleared off carefully owned in regard to the left foot. Nonetheless on the right foot there was a significant amount of callous that had to be removed that was cracked open and draining. This was trapping fluid underneath and post removal there was an ulceration that was noted centrally and this will be addressed with dressings. She had excellent blood flow and some Slough covering this wound as Actor) Signed: 10/06/2017 8:05:24 AM By: Lenda Kelp PA-C Entered By: Lenda Kelp on 10/05/2017 17:52:51 [name] ([id number]) -------------------------------------------------------------------------------- Physician Orders Details Patient Name: [name] Date of Service: 10/05/2017 10:15 AM Medical Record Number: [id number] Patient Account Number: [id number] Date of Birth/Sex: May 31, 1951 (66 y.o. 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 o May Shower, gently pat wound dry prior to applying new dressing. Wound #2 Left,Plantar Metatarsal head fifth o Clean wound with Normal Saline. o May Shower, gently pat wound dry prior to applying new dressing. Wound #3 Right,Plantar Metatarsal head first o Clean wound with Normal Saline. o May Shower, gently pat wound dry prior to applying new dressing. Anesthetic (add to Medication List) Wound #1 Left,Plantar Metatarsal head first o Topical Lidocaine 4% cream applied to wound bed prior to debridement (In Clinic Only). Wound #2 Left,Plantar Metatarsal head fifth o Topical Lidocaine 4% cream applied to wound bed prior to debridement (In Clinic Only). Wound #3 Right,Plantar Metatarsal head first o Topical Lidocaine 4% cream applied to wound bed prior to debridement (In Clinic Only). Primary Wound Dressing Wound #1 Left,Plantar Metatarsal head first o [place] Non-Adherent Wound #2 Left,Plantar Metatarsal head fifth o [place] Non-Adherent Wound #3 Right,Plantar Metatarsal head first o [place] Non-Adherent Secondary Dressing Wound #1 Left,Plantar Metatarsal head first o Gauze and Kerlix/Conform Stiefel, Curtis I. ([id number]) o Gauze and Kerlix/Conform o Foam - for offloading o Other - felt for offloading Wound #2 Left,Plantar Metatarsal head fifth o Gauze and Kerlix/Conform o Foam - for offloading o Other - felt for offloading Wound #3 Right,Plantar Metatarsal head first o Gauze and Kerlix/Conform o Foam - for offloading o Other - felt for  offloading Dressing Change Frequency Wound #1 Left,Plantar Metatarsal head first o Change dressing every other day. Wound #2 Left,Plantar Metatarsal head fifth o Change dressing every other day. Wound #3 Right,Plantar Metatarsal head first o Change dressing every other day. Follow-up Appointments Wound #1 Left,Plantar Metatarsal head first o Return Appointment in 1 week. Wound #2 Left,Plantar Metatarsal head fifth o Return Appointment in 1 week. Wound #3 Right,Plantar Metatarsal head first o Return Appointment in 1 week. Edema Control Wound #1 Left,Plantar Metatarsal head first o Elevate legs to the level of the heart and pump ankles as often as possible Wound #2 Left,Plantar Metatarsal head fifth o Elevate legs to the level of the heart and pump ankles as often as possible Wound #3 Right,Plantar Metatarsal head first o Elevate legs to the level of the heart and pump ankles as often as possible Off-Loading Wound #1 Left,Plantar Metatarsal head first o Open toe surgical shoe  to: - left foot, felt Wound #2 Left,Plantar Metatarsal head fifth o Open toe surgical shoe to: - left foot, felt Wound #3 Right,Plantar Metatarsal head first o Open toe surgical shoe to: - left foot, felt Additional Orders / Instructions LEXANY, BELKNAP I. ([id number]) Wound #1 Left,Plantar Metatarsal head first o Increase protein intake. Wound #2 Left,Plantar Metatarsal head fifth o Increase protein intake. Wound #3 Right,Plantar Metatarsal head first o Increase protein intake. Patient Medications Allergies: amoxicillin, Ambien Notifications Medication Indication Start End lidocaine DOSE 1 - topical 4 % cream - 1 cream topical Electronic Signature(s) Signed: 10/05/2017 3:54:03 PM By: Curtis Sites Signed: 10/06/2017 8:05:24 AM By: Lenda Kelp PA-C Entered By: Curtis Sites on 10/05/2017 11:30:38 [name]  ([id number]) -------------------------------------------------------------------------------- Prescription 10/05/2017 Patient Name: [name] Provider: Lenda Kelp PA-C Date of Birth: 1951/07/01 NPI#: [id number] Sex: F DEA#: [id number] Phone #: [phone] License #: Patient Address: [facility] [address] [facility] [place], [place] [address] [address], [address] [place], [place] [address] [phone] Allergies amoxicillin Ambien Medication Medication: Route: Strength: Form: lidocaine 4 % topical cream topical 4% cream Class: TOPICAL LOCAL ANESTHETICS Dose: Frequency / Time: Indication: 1 1 cream topical Number of Refills: Number of Units: 0 Generic Substitution: Start Date: End Date: One Time Use: Substitution Permitted No Note to Pharmacy: Signature(s): Date(s): Electronic Signature(s) Signed: 10/05/2017 3:54:03 PM By: Curtis Sites Signed: 10/06/2017 8:05:24 AM By: Lenda Kelp PA-C Entered By: Curtis Sites on 10/05/2017 11:30:41 [name] ([id number]) [name] ([id number]) --------------------------------------------------------------------------------  Problem List Details Patient Name: [name] Date of Service: 10/05/2017 10:15 AM Medical Record Number: [id number] Patient Account Number: [id number] Date of Birth/Sex: [birth year] (66 y.o. 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 Female) Treating RN: Curtis Sites Primary Care Provider: Jarome Matin Other Clinician: Referring Provider: Jarome Matin Treating Provider/Extender: Linwood Dibbles, HOYT Weeks in Treatment: 10 Subjective Chief Complaint Information obtained from Patient Patient presents to the wound care center for a consult due non healing wound to the left plantar foot which she's had on and off for a number of years History of Present Illness (HPI) The following HPI elements were documented for the patient's wound: Location: if plantar foot in the region of the first metatarsal head and the fifth metatarsal head Quality: Patient reports No Pain. Severity: Patient states wound are getting worse. Duration: Patient has had the wound for > 3 months prior to seeking treatment at the wound center Context: The wound would happen gradually Modifying Factors: Other treatment(s) tried include:is been under the care of [facility] orthopedics Associated Signs and Symptoms: Patient reports having increase swelling. 67 year old patient referred  to was from [facility] orthopedics where she was seen by Dr. Laverta Baltimore team saw her for a left foot ulcer which was there for about 4 months. She was seen in the emergency room on 06/05/2017 and they did a MRI which ruled out osteomyelitis and they placed her on  antibiotics, which was Keflex for 3 weeks. She was found to have plantar ulcers in the region of the great toe and on the fifth metatarsal head. 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 on examination the patient was found to have 2 ulcers on the plantar aspect of the left forefoot the one on the fifth metatarsal head was 2-1/2 cm in diameter and down to the muscle. There was a lot of hypertrophic granulation tissue. It did not probe down to bone. Note the patient was seen in July of this year by Dr. 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 She was recommended a hallux MP joint arthrodesis to correct the bunion and anti-tic conditions and she would weight-bear in a cam boot postoperatively. Her most recent MRI done on September 12 -- IMPRESSION: Skin wound at the fifth MTP joint without underlying abscess, septic joint or osteomyelitis.Hallux valgus and first MTP osteoarthritis. Midfoot degenerative change also noted. 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 Right toe brachial index of 0.52 and left of 0.4 suggestive of abnormal arterial flow at rest. left flow was monophasic on the dorsalis pedis and biphasic on the right side. 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 08/10/2017 -- she has upcoming appointments to see Dr. Victorino Dike and also to see the vascular surgeons at [facility].  Other than that she is doing well and being compliant with a dressing. 08/14/2017 -- she saw Dr. Victorino Dike earlier this morning and his note is pending. She has not yet had her appointment at the vascular office as yet. 08/21/2017 -- we have not yet received the consultation note from Dr. Victorino Dike and how vascular appointment is later this week. 08/28/17 on evaluation today patient appears to be doing fairly well in regard to the ulcers on her left lower extremity. She did have her arterial study with the following results. According to left arterial duplex study patient's left lower extremity arterial system is patent without evidence of Grimm, Mazzy I. ([id number]) hemodynamically significant stenosis. Peak systolic velocity of the bed superficial femoral artery is suggestive of a 30-49% stenosis with wall irregularity however the ratio is less than 1.5 Fortunately, she tells me that she does have an appointment scheduled for January 8 Lauren arteriogram as well to further evaluate this. No fevers, chills, nausea, or vomiting noted at this time. Overall she is pleased with how things have been progressing. 09/07/2017 -- -- was seen by Dr. Durene Cal, who reviewed her vascular lab studies which showed ABI of 0.73 on the left and 1.02 on the right and monophasic waveforms throughout the left leg with no significant stenosis. Toe pressure was 50 on the left. He has  recommended proceeding with angiography and this has been scheduled for early January as per the patient s request. 10/05/17 on evaluation today patient's wound appears to be doing fairly well at this point. She has been recommended to go forward with an angiogram and that had been rescheduled but at this point in time is actually scheduled for the 15th of this month which is next week. Unfortunately this had to be pushed back. This was due to her blood thinner medication which she thought she needed to stop a different day. Nonetheless  her wound does not appear to be too bad in regard to the [facility] surface of the foot is just not showing good signs of improving. Basically she is maintaining. Unfortunately she did have a new area on the right plantar surface of her foot which was incidentally noted by patient and right and suggested to [place] to look at and unfortunately she had a crack callous that revealed a small ulcer underneath. The good news is she has much better blood flow to the site. The bad news is obviously she now has an additional wound. Patient History Information obtained from Patient. Social History Former smoker - quit 25 years ago, Marital Status - Single, Alcohol Use - Never, Drug Use - No History, Caffeine Use - Daily. Review of Systems (ROS) Constitutional Symptoms (General Health) The patient has no complaints or symptoms. Psychiatric The patient has no complaints or symptoms. Objective Constitutional Well-nourished and well-hydrated in no acute distress. Vitals Time Taken: 10:34 AM, Height: 64 in, Weight: 262 lbs, BMI: 45, Temperature: 98.3 F, Pulse: 60 bpm, Respiratory Rate: 18 breaths/min, Blood Pressure: 136/82 mmHg. Respiratory normal breathing without difficulty. Psychiatric this patient is able to make decisions and demonstrates good insight into disease process. Alert and Oriented x 3. pleasant and cooperative. General Notes: On evaluation today patient's wound bed does appear to show some callous on the left surrounding and I did Gallina, Gudelia I. ([id number]) work on some of this today. There was some Slough overlying the wound bed which was also cleared off carefully owned in regard to the left foot. Nonetheless on the right foot there was a significant amount of callous that had to be removed that was cracked open and draining. This was trapping fluid underneath and post removal there was an ulceration that was noted centrally and this will be addressed with dressings. She had excellent  blood flow and some Slough covering this wound as well Integumentary (Hair, Skin) Wound #1 status is Open. Original cause of wound was Gradually Appeared. The wound is located on the Left,Plantar Metatarsal head first. The wound measures 0.4cm length x 0.5cm width x 0.4cm depth; 0.157cm^2 area and 0.063cm^3 volume. There is Fat Layer (Subcutaneous Tissue) Exposed exposed. There is no tunneling or undermining noted. There is a large amount of serosanguineous drainage noted. The wound margin is distinct with the outline attached to the wound base. There is large (67-100%) red granulation within the wound bed. There is a small (1-33%) amount of necrotic tissue within the wound bed including Eschar and Adherent Slough. The periwound skin appearance exhibited: Callus. The periwound skin appearance did not exhibit: Crepitus, Excoriation, Induration, Rash, Scarring, Dry/Scaly, Maceration, Atrophie Blanche, Cyanosis, Ecchymosis, Hemosiderin Staining, Mottled, Pallor, Rubor, Erythema. Periwound temperature was noted as No Abnormality. The periwound has tenderness on palpation. Wound #2 status is Open. Original cause of wound was Trauma. The wound is located on the Left,Plantar Metatarsal head fifth. The wound measures 0.5cm length x 0.5cm width  x 0.1cm depth; 0.196cm^2 area and 0.02cm^3 volume. There is Fat Layer (Subcutaneous Tissue) Exposed exposed. There is no tunneling or undermining noted. There is a large amount of serosanguineous drainage noted. The wound margin is distinct with the outline attached to the wound base. There is large (67-100%) red, hyper - granulation within the wound bed. There is a small (1-33%) amount of necrotic tissue within the wound bed including Adherent Slough. The periwound skin appearance exhibited: Callus. The periwound skin appearance did not exhibit: Crepitus, Excoriation, Induration, Rash, Scarring, Dry/Scaly, Maceration, Atrophie Blanche, Cyanosis,  Ecchymosis, Hemosiderin Staining, Mottled, Pallor, Rubor, Erythema. Periwound temperature was noted as No Abnormality. The periwound has tenderness on palpation. Wound #3 status is Open. Original cause of wound was Gradually Appeared. The wound is located on the Right,Plantar Metatarsal head first. The wound measures 0.8cm length x 0.1cm width x 0.2cm depth; 0.063cm^2 area and 0.013cm^3 volume. There is Fat Layer (Subcutaneous Tissue) Exposed exposed. There is no tunneling or undermining noted. There is a medium amount of serous drainage noted. The wound margin is flat and intact. There is large (67-100%) pink granulation within the wound bed. There is a small (1-33%) amount of necrotic tissue within the wound bed including Adherent Slough. The periwound skin appearance exhibited: Callus. The periwound skin appearance did not exhibit: Crepitus, Excoriation, Induration, Rash, Scarring, Dry/Scaly, Maceration, Atrophie Blanche, Cyanosis, Ecchymosis, Hemosiderin Staining, Mottled, Pallor, Rubor, Erythema. Periwound temperature was noted as No Abnormality. The periwound has tenderness on palpation. 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 ([id number]) Pre-procedure diagnosis of Wound #1 is a Neuropathic Ulcer-Non Diabetic located on the Left,Plantar Metatarsal head first . There was a Skin/Subcutaneous Tissue Debridement ([id number]([id number]) debridement with total area of 0.2 sq cm performed by STONE III, HOYT E., PA-C. with the following instrument(s): Curette to remove Viable and Non-Viable tissue/material including Fibrin/Slough, Callus, and  Subcutaneous after achieving pain control using Lidocaine 4% Topical Solution. A time out was conducted at 11:04, prior to the start of the procedure. A Minimum amount of bleeding was controlled with Pressure. The procedure was tolerated well with a pain level of 0 throughout and a pain level of 0 following the procedure. Post Debridement Measurements: 0.5cm length x 0.5cm width x 0.3cm depth; 0.059cm^3 volume. Character of Wound/Ulcer Post Debridement is improved. 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 There was a Skin/Subcutaneous Tissue Debridement ([id number]([id number]) debridement with total area of 0.25 sq cm performed by STONE III, HOYT E., PA-C. with the following instrument(s): Curette to remove Viable and Non-Viable tissue/material including Fibrin/Slough, Callus, and Subcutaneous after achieving pain control using Lidocaine 4% Topical Solution. A time out was conducted at 11:01, prior to the start of the procedure. A Minimum amount of bleeding was controlled with Pressure. The procedure was tolerated well with a pain level of 0 throughout and a pain level of 0 following the procedure. Post Debridement Measurements: 0.6cm length x 0.6cm width x 0.2cm depth; 0.057cm^3 volume. Character of Wound/Ulcer Post Debridement is improved. Post procedure Diagnosis Wound #2: Same as Pre-Procedure Wound #3 Pre-procedure diagnosis of Wound #3  is a Neuropathic Ulcer-Non Diabetic located on the Right,Plantar Metatarsal head first . There was a Skin/Subcutaneous Tissue Debridement ([id number]) debridement with total area of 0.08 sq cm performed by STONE III, HOYT E., PA-C. with the following instrument(s): Curette, Forceps, and Scissors to remove Viable and Non-Viable tissue/material including Fibrin/Slough, Callus, and Subcutaneous after achieving pain control  using Lidocaine 4% Topical Solution. A time out was conducted at 11:09, prior to the start of the procedure. A Minimum amount of bleeding was controlled with Pressure. The procedure was tolerated well with a pain level of 0 throughout and a pain level of 0 following the procedure. Post Debridement Measurements: 0.6cm length x 0.6cm width x 0.1cm depth; 0.028cm^3 volume. Character of Wound/Ulcer Post Debridement is improved. Post procedure Diagnosis Wound #3: Same as Pre-Procedure Plan Wound Cleansing: Wound #1 Left,Plantar Metatarsal head first: Clean wound with Normal Saline. May Shower, gently pat wound dry prior to applying new dressing. Wound #2 Left,Plantar Metatarsal head fifth: Clean wound with Normal Saline. May Shower, gently pat wound dry prior to applying new dressing. Wound #3 Right,Plantar Metatarsal head first: Clean wound with Normal Saline. May Shower, gently pat wound dry prior to applying new dressing. Anesthetic (add to Medication List): Wound #1 Left,Plantar Metatarsal head first: Topical Lidocaine 4% cream applied to wound bed prior to debridement (In Clinic Only). Wound #2 Left,Plantar Metatarsal head fifth: Topical Lidocaine 4% cream applied to wound bed prior to debridement (In Clinic Only). Wound #3 Right,Plantar Metatarsal head first: Topical Lidocaine 4% cream applied to wound bed prior to debridement (In Clinic Only). Primary Wound Dressing: ARIA, JARRARD I. ([id number]) Wound #1 Left,Plantar Metatarsal head first: [place] Non-Adherent Wound #2 Left,Plantar Metatarsal head fifth: [place] Non-Adherent Wound #3 Right,Plantar Metatarsal head first: [place] Non-Adherent Secondary Dressing: Wound #1 Left,Plantar Metatarsal head first: Gauze and Kerlix/Conform Gauze and Kerlix/Conform Foam - for offloading Other - felt for offloading Wound #2 Left,Plantar Metatarsal head fifth: Gauze and Kerlix/Conform Foam - for offloading Other - felt for  offloading Wound #3 Right,Plantar Metatarsal head first: Gauze and Kerlix/Conform Foam - for offloading Other - felt for offloading Dressing Change Frequency: Wound #1 Left,Plantar Metatarsal head first: Change dressing every other day. Wound #2 Left,Plantar Metatarsal head fifth: Change dressing every other day. Wound #3 Right,Plantar Metatarsal head first: Change dressing every other day. Follow-up Appointments: Wound #1 Left,Plantar Metatarsal head first: Return Appointment in 1 week. Wound #2 Left,Plantar Metatarsal head fifth: Return Appointment in 1 week. Wound #3 Right,Plantar Metatarsal head first: Return Appointment in 1 week. Edema Control: Wound #1 Left,Plantar Metatarsal head first: Elevate legs to the level of the heart and pump ankles as often as possible Wound #2 Left,Plantar Metatarsal head fifth: Elevate legs to the level of the heart and pump ankles as often as possible Wound #3 Right,Plantar Metatarsal head first: Elevate legs to the level of the heart and pump ankles as often as possible Off-Loading: Wound #1 Left,Plantar Metatarsal head first: Open toe surgical shoe to: - left foot, felt Wound #2 Left,Plantar Metatarsal head fifth: Open toe surgical shoe to: - left foot, felt Wound #3 Right,Plantar Metatarsal head first: Open toe surgical shoe to: - left foot, felt Additional Orders / Instructions: Wound #1 Left,Plantar Metatarsal head first: Increase protein intake. Wound #2 Left,Plantar Metatarsal head fifth: Increase protein intake. Wound #3 Right,Plantar Metatarsal head first: Increase protein intake. The following medication(s) was prescribed: lidocaine topical 4 % cream 1 1 cream topical was prescribed at facility SHAQUAVIA, WHISONANT I. ([id number]) At this point I'm  gonna recommend that we initiate treatment for the right foot wound as well which is new. Patient is in agreement with this plan. I will see her for reevaluation in one week to see were  things stand at that point. In the meantime she will also be having her appointment with vascular for the angiogram. That is on the 15th of this month January 2019. Please see above for specific wound care orders. We will see patient for re-evaluation in 1 week(s) here in the clinic. If anything worsens or changes patient will contact our office for additional recommendations. Electronic Signature(s) Signed: 10/13/2017 9:58:07 AM By: Elliot Gurney, BSN, RN, CWS, Kim RN, BSN Signed: 10/18/2017 7:57:29 AM By: Lenda Kelp PA-C Previous Signature: 10/06/2017 8:05:24 AM Version By: Lenda Kelp PA-C Entered By: Elliot Gurney BSN, RN, CWS, Kim on 10/13/2017 09:58:07 [name] ([id number]) -------------------------------------------------------------------------------- ROS/PFSH Details Patient Name: [name] Date of Service: 10/05/2017 10:15 AM Medical Record Number: [id number] Patient Account Number: [id number] Date of Birth/Sex: 25-Oct-1950 (66 y.o. Female) Treating RN: Curtis Sites Primary Care Provider: Jarome Matin Other Clinician: Referring Provider: Jarome Matin Treating Provider/Extender: Linwood Dibbles, HOYT Weeks in Treatment: 10 Information Obtained From Patient Wound History Do you currently have one or more open woundso Yes How many open wounds do you currently haveo 2 Approximately how long have you had your woundso 8 weeks How have you been treating your wound(s) until nowo hydrogel, Has your wound(s) ever healed and then re-openedo No Have you had any lab work done in the past montho Yes Who ordered the lab work doneo [facility] Have you tested positive for an antibiotic resistant organism (MRSA, VRE)o No Have you tested positive for osteomyelitis (bone infection)o No Have you had any tests for circulation on your legso Yes Who ordered the testo hospital Where was the test doneo [facility] Have you had other problems associated with your woundso Swelling Constitutional  Symptoms (General Health) Complaints and Symptoms: No Complaints or Symptoms Cardiovascular Medical History: Positive for: Arrhythmia - a fib; Hypertension Neurologic Medical History: Positive for: Neuropathy Psychiatric Complaints and Symptoms: No Complaints or Symptoms Immunizations Pneumococcal Vaccine: Received Pneumococcal Vaccination: No Implantable Devices Family and Social History Former smoker - quit 25 years ago; Marital Status - Single; Alcohol Use: Never; Drug Use: No History; Caffeine Use: Daily; Financial Concerns: No; Food, Clothing or Shelter Needs: No; Support System Lacking: No; Transportation Concerns: No; Advanced Directives: No; Patient does not want information on Advanced Directives ARWILDA, [place] ([id number]) Physician Affirmation I have reviewed and agree with the above information. Electronic Signature(s) Signed: 10/06/2017 8:05:24 AM By: Lenda Kelp PA-C Signed: 10/06/2017 4:38:28 PM By: Curtis Sites Entered By: Lenda Kelp on 10/05/2017 17:52:04 [name] ([id number]) -------------------------------------------------------------------------------- SuperBill Details Patient Name: [name] Date of Service: 10/05/2017 Medical Record Number: [id number] Patient Account Number: [id number] Date of Birth/Sex: 02-18-1951 (66 y.o. 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 E66.01 Morbid (severe) obesity due to excess calories Facility Procedures CPT4 Code:  [id number] Description: 11042 - DEB SUBQ TISSUE 20 SQ CM/< ICD-10 Diagnosis Description L97.522 Non-pressure chronic ulcer of other part of left foot with  fat L97.512 Non-pressure chronic ulcer of other part of right foot with fat Modifier: layer exposed layer exposed Quantity: 1 Physician Procedures CPT4 Code: [id number] Description: 11042 - WC PHYS SUBQ TISS 20 SQ CM ICD-10 Diagnosis Description L97.522 Non-pressure chronic ulcer of other part of left foot with fat L97.512 Non-pressure chronic ulcer of other part of right foot with fat Modifier: layer exposed layer exposed Quantity: 1 Electronic Signature(s) Signed: 10/06/2017 8:05:24 AM By: Lenda Kelp PA-C Entered By: Lenda Kelp on 10/05/2017 17:54:59

## 2017-10-10 ENCOUNTER — Encounter (HOSPITAL_COMMUNITY)
Admission: RE | Disposition: A | Discharge status: Home / Self Care | Payer: Self-pay | Source: Ambulatory Visit | Attending: Surgery

## 2017-10-10 ENCOUNTER — Inpatient Hospital Stay (HOSPITAL_COMMUNITY)
Admission: RE | Admit: 2017-10-10 | Discharge: 2017-10-12 | DRG: 253 | Disposition: A | Discharge status: Home / Self Care | Payer: PPO | Source: Ambulatory Visit | Attending: Surgery | Admitting: Surgery

## 2017-10-10 DIAGNOSIS — G4761 Periodic limb movement disorder: Secondary | ICD-10-CM | POA: Diagnosis present

## 2017-10-10 DIAGNOSIS — Z820 Family history of epilepsy and other diseases of the nervous system: Secondary | ICD-10-CM | POA: Diagnosis not present

## 2017-10-10 DIAGNOSIS — L97529 Non-pressure chronic ulcer of other part of left foot with unspecified severity: Secondary | ICD-10-CM | POA: Diagnosis present

## 2017-10-10 DIAGNOSIS — G4733 Obstructive sleep apnea (adult) (pediatric): Secondary | ICD-10-CM | POA: Diagnosis present

## 2017-10-10 DIAGNOSIS — Z87442 Personal history of urinary calculi: Secondary | ICD-10-CM | POA: Diagnosis not present

## 2017-10-10 DIAGNOSIS — I70209 Unspecified atherosclerosis of native arteries of extremities, unspecified extremity: Secondary | ICD-10-CM | POA: Diagnosis present

## 2017-10-10 DIAGNOSIS — G2581 Restless legs syndrome: Secondary | ICD-10-CM | POA: Diagnosis present

## 2017-10-10 DIAGNOSIS — M79609 Pain in unspecified limb: Secondary | ICD-10-CM | POA: Diagnosis not present

## 2017-10-10 DIAGNOSIS — I10 Essential (primary) hypertension: Secondary | ICD-10-CM | POA: Diagnosis present

## 2017-10-10 DIAGNOSIS — E785 Hyperlipidemia, unspecified: Secondary | ICD-10-CM | POA: Diagnosis present

## 2017-10-10 DIAGNOSIS — K219 Gastro-esophageal reflux disease without esophagitis: Secondary | ICD-10-CM | POA: Diagnosis present

## 2017-10-10 DIAGNOSIS — D62 Acute posthemorrhagic anemia: Secondary | ICD-10-CM | POA: Diagnosis not present

## 2017-10-10 DIAGNOSIS — F319 Bipolar disorder, unspecified: Secondary | ICD-10-CM | POA: Diagnosis present

## 2017-10-10 DIAGNOSIS — I70248 Atherosclerosis of native arteries of left leg with ulceration of other part of lower left leg: Secondary | ICD-10-CM

## 2017-10-10 DIAGNOSIS — Z881 Allergy status to other antibiotic agents status: Secondary | ICD-10-CM

## 2017-10-10 DIAGNOSIS — I480 Paroxysmal atrial fibrillation: Secondary | ICD-10-CM | POA: Diagnosis present

## 2017-10-10 DIAGNOSIS — I70245 Atherosclerosis of native arteries of left leg with ulceration of other part of foot: Secondary | ICD-10-CM | POA: Diagnosis present

## 2017-10-10 DIAGNOSIS — M170 Bilateral primary osteoarthritis of knee: Secondary | ICD-10-CM | POA: Diagnosis present

## 2017-10-10 DIAGNOSIS — M545 Low back pain: Secondary | ICD-10-CM | POA: Diagnosis present

## 2017-10-10 DIAGNOSIS — L97829 Non-pressure chronic ulcer of other part of left lower leg with unspecified severity: Secondary | ICD-10-CM | POA: Diagnosis present

## 2017-10-10 DIAGNOSIS — Z87891 Personal history of nicotine dependence: Secondary | ICD-10-CM | POA: Diagnosis not present

## 2017-10-10 DIAGNOSIS — Z79899 Other long term (current) drug therapy: Secondary | ICD-10-CM

## 2017-10-10 DIAGNOSIS — F419 Anxiety disorder, unspecified: Secondary | ICD-10-CM | POA: Diagnosis present

## 2017-10-10 DIAGNOSIS — I70238 Atherosclerosis of native arteries of right leg with ulceration of other part of lower right leg: Secondary | ICD-10-CM

## 2017-10-10 DIAGNOSIS — L98499 Non-pressure chronic ulcer of skin of other sites with unspecified severity: Secondary | ICD-10-CM

## 2017-10-10 DIAGNOSIS — R112 Nausea with vomiting, unspecified: Secondary | ICD-10-CM | POA: Diagnosis not present

## 2017-10-10 DIAGNOSIS — Z888 Allergy status to other drugs, medicaments and biological substances status: Secondary | ICD-10-CM

## 2017-10-10 DIAGNOSIS — Z6841 Body Mass Index (BMI) 40.0 and over, adult: Secondary | ICD-10-CM | POA: Diagnosis not present

## 2017-10-10 DIAGNOSIS — R1031 Right lower quadrant pain: Secondary | ICD-10-CM | POA: Diagnosis not present

## 2017-10-10 DIAGNOSIS — Z7901 Long term (current) use of anticoagulants: Secondary | ICD-10-CM

## 2017-10-10 HISTORY — PX: PERIPHERAL VASCULAR INTERVENTION: CATH118257

## 2017-10-10 HISTORY — PX: ABDOMINAL AORTOGRAM W/LOWER EXTREMITY: CATH118223

## 2017-10-10 LAB — POCT I-STAT, CHEM 8
BUN: 16 mg/dL (ref 6–20)
CALCIUM ION: 1.03 mmol/L — AB (ref 1.15–1.40)
CHLORIDE: 108 mmol/L (ref 101–111)
Creatinine, Ser: 1.1 mg/dL — ABNORMAL HIGH (ref 0.44–1.00)
Glucose, Bld: 121 mg/dL — ABNORMAL HIGH (ref 65–99)
HCT: 37 % (ref 36.0–46.0)
Hemoglobin: 12.6 g/dL (ref 12.0–15.0)
Potassium: 3.5 mmol/L (ref 3.5–5.1)
Sodium: 141 mmol/L (ref 135–145)
TCO2: 22 mmol/L (ref 22–32)

## 2017-10-10 LAB — POCT ACTIVATED CLOTTING TIME
ACTIVATED CLOTTING TIME: 186 s
ACTIVATED CLOTTING TIME: 180 s
Activated Clotting Time: 230 seconds

## 2017-10-10 SURGERY — ABDOMINAL AORTOGRAM W/LOWER EXTREMITY
Anesthesia: LOCAL

## 2017-10-10 MED ORDER — ADULT MULTIVITAMIN LIQUID CH
Freq: Every day | ORAL | Status: DC
  Filled 2017-10-10 (×2): qty 15

## 2017-10-10 MED ORDER — SODIUM CHLORIDE 0.9 % IV SOLN
INTRAVENOUS | Status: DC
  Administered 2017-10-10: 22:00:00 via INTRAVENOUS

## 2017-10-10 MED ORDER — PHENOL 1.4 % MT LIQD: 1.0000 | OROMUCOSAL | Status: DC | PRN

## 2017-10-10 MED ORDER — MORPHINE SULFATE (PF) 4 MG/ML IV SOLN: 2.0000 mg | INTRAVENOUS | Status: DC | PRN

## 2017-10-10 MED ORDER — LABETALOL HCL 5 MG/ML IV SOLN: 10.0000 mg | INTRAVENOUS | Status: DC | PRN

## 2017-10-10 MED ORDER — IODIXANOL 320 MG/ML IV SOLN
INTRAVENOUS | Status: DC | PRN
  Administered 2017-10-10: 130 mL via INTRA_ARTERIAL

## 2017-10-10 MED ORDER — HYDRALAZINE HCL 20 MG/ML IJ SOLN: 5.0000 mg | INTRAMUSCULAR | Status: DC | PRN

## 2017-10-10 MED ORDER — BUPROPION HCL ER (XL) 300 MG PO TB24
300.0000 mg | ORAL_TABLET | Freq: Every day | ORAL | Status: DC
  Administered 2017-10-11 – 2017-10-12 (×2): 300 mg via ORAL
  Filled 2017-10-10 (×2): qty 1

## 2017-10-10 MED ORDER — SODIUM CHLORIDE 0.9% FLUSH: 3.0000 mL | INTRAVENOUS | Status: DC | PRN

## 2017-10-10 MED ORDER — POLYSACCHARIDE IRON COMPLEX 150 MG PO CAPS
150.0000 mg | ORAL_CAPSULE | Freq: Every day | ORAL | Status: DC
  Administered 2017-10-11 – 2017-10-12 (×2): 150 mg via ORAL
  Filled 2017-10-10 (×2): qty 1

## 2017-10-10 MED ORDER — MORPHINE SULFATE (PF) 2 MG/ML IV SOLN
2.0000 mg | INTRAVENOUS | Status: DC | PRN
Start: 2017-10-10 — End: 2017-10-10
  Administered 2017-10-10: 2 mg via INTRAVENOUS

## 2017-10-10 MED ORDER — METOPROLOL TARTRATE 5 MG/5ML IV SOLN: 2.0000 mg | INTRAVENOUS | Status: DC | PRN

## 2017-10-10 MED ORDER — SODIUM CHLORIDE 0.9 % IV SOLN
250.0000 mL | INTRAVENOUS | Status: DC | PRN
Start: 2017-10-10 — End: 2017-10-10

## 2017-10-10 MED ORDER — ASPIRIN 81 MG PO CHEW
81.0000 mg | CHEWABLE_TABLET | Freq: Every day | ORAL | Status: DC
  Administered 2017-10-10: 81 mg via ORAL

## 2017-10-10 MED ORDER — SODIUM CHLORIDE 0.9 % IV SOLN: INTRAVENOUS | Status: DC

## 2017-10-10 MED ORDER — LISINOPRIL 40 MG PO TABS
40.0000 mg | ORAL_TABLET | Freq: Every day | ORAL | Status: DC
  Administered 2017-10-11 – 2017-10-12 (×2): 40 mg via ORAL
  Filled 2017-10-10 (×2): qty 1

## 2017-10-10 MED ORDER — MIDAZOLAM HCL 2 MG/2ML IJ SOLN
INTRAMUSCULAR | Status: AC
  Filled 2017-10-10: qty 2

## 2017-10-10 MED ORDER — MORPHINE SULFATE (PF) 4 MG/ML IV SOLN
INTRAVENOUS | Status: AC
  Filled 2017-10-10: qty 1

## 2017-10-10 MED ORDER — MIRTAZAPINE 15 MG PO TABS
15.0000 mg | ORAL_TABLET | Freq: Every day | ORAL | Status: DC
  Administered 2017-10-11: 15 mg via ORAL
  Filled 2017-10-10 (×2): qty 1

## 2017-10-10 MED ORDER — ASPIRIN 81 MG PO CHEW
CHEWABLE_TABLET | ORAL | Status: AC
  Filled 2017-10-10: qty 1

## 2017-10-10 MED ORDER — HEPARIN SODIUM (PORCINE) 1000 UNIT/ML IJ SOLN
INTRAMUSCULAR | Status: AC
  Filled 2017-10-10: qty 1

## 2017-10-10 MED ORDER — PROMETHAZINE HCL 25 MG/ML IJ SOLN
12.5000 mg | Freq: Once | INTRAMUSCULAR | Status: AC
  Administered 2017-10-10: 12.5 mg via INTRAVENOUS
  Filled 2017-10-10: qty 1

## 2017-10-10 MED ORDER — LIDOCAINE HCL (PF) 1 % IJ SOLN
INTRAMUSCULAR | Status: DC | PRN
  Administered 2017-10-10: 18 mL

## 2017-10-10 MED ORDER — FENTANYL CITRATE (PF) 100 MCG/2ML IJ SOLN
INTRAMUSCULAR | Status: DC | PRN
  Administered 2017-10-10 (×3): 25 ug via INTRAVENOUS

## 2017-10-10 MED ORDER — HEPARIN SODIUM (PORCINE) 1000 UNIT/ML IJ SOLN
INTRAMUSCULAR | Status: DC | PRN
  Administered 2017-10-10: 10000 [IU] via INTRAVENOUS

## 2017-10-10 MED ORDER — FLUTICASONE PROPIONATE 50 MCG/ACT NA SUSP: 1.0000 | Freq: Every day | NASAL | Status: DC | PRN

## 2017-10-10 MED ORDER — ALUM & MAG HYDROXIDE-SIMETH 200-200-20 MG/5ML PO SUSP
15.0000 mL | ORAL | Status: DC | PRN
  Administered 2017-10-11: 30 mL via ORAL
  Filled 2017-10-10: qty 30

## 2017-10-10 MED ORDER — HEPARIN (PORCINE) IN NACL 2-0.9 UNIT/ML-% IJ SOLN
INTRAMUSCULAR | Status: AC | PRN
  Administered 2017-10-10: 1000 mL

## 2017-10-10 MED ORDER — SODIUM CHLORIDE 0.9 % IV SOLN
INTRAVENOUS | Status: DC
  Administered 2017-10-10: 08:00:00 via INTRAVENOUS

## 2017-10-10 MED ORDER — AMLODIPINE BESYLATE 10 MG PO TABS
10.0000 mg | ORAL_TABLET | Freq: Every day | ORAL | Status: DC
  Administered 2017-10-11 – 2017-10-12 (×2): 10 mg via ORAL
  Filled 2017-10-10 (×4): qty 1

## 2017-10-10 MED ORDER — ACETAMINOPHEN 325 MG PO TABS: 325.0000 mg | ORAL_TABLET | Freq: Four times a day (QID) | ORAL | Status: DC | PRN

## 2017-10-10 MED ORDER — LIDOCAINE HCL (PF) 1 % IJ SOLN
INTRAMUSCULAR | Status: AC
  Filled 2017-10-10: qty 30

## 2017-10-10 MED ORDER — ASPIRIN 81 MG PO CHEW
CHEWABLE_TABLET | ORAL | Status: AC
  Administered 2017-10-10: 81 mg via ORAL
  Filled 2017-10-10: qty 1

## 2017-10-10 MED ORDER — DOFETILIDE 250 MCG PO CAPS
250.0000 ug | ORAL_CAPSULE | Freq: Two times a day (BID) | ORAL | Status: DC
  Administered 2017-10-10 – 2017-10-12 (×4): 250 ug via ORAL
  Filled 2017-10-10 (×5): qty 1

## 2017-10-10 MED ORDER — PANTOPRAZOLE SODIUM 40 MG PO TBEC
40.0000 mg | DELAYED_RELEASE_TABLET | Freq: Every day | ORAL | Status: DC
  Administered 2017-10-11 – 2017-10-12 (×2): 40 mg via ORAL
  Filled 2017-10-10 (×2): qty 1

## 2017-10-10 MED ORDER — GUAIFENESIN-DM 100-10 MG/5ML PO SYRP
15.0000 mL | ORAL_SOLUTION | ORAL | Status: DC | PRN
Start: 2017-10-10 — End: 2017-10-12

## 2017-10-10 MED ORDER — MIDAZOLAM HCL 2 MG/2ML IJ SOLN
INTRAMUSCULAR | Status: DC | PRN
  Administered 2017-10-10 (×3): 1 mg via INTRAVENOUS

## 2017-10-10 MED ORDER — ONDANSETRON HCL 4 MG/2ML IJ SOLN
4.0000 mg | Freq: Four times a day (QID) | INTRAMUSCULAR | Status: DC | PRN
  Administered 2017-10-10 – 2017-10-11 (×2): 4 mg via INTRAVENOUS
  Filled 2017-10-10 (×2): qty 2

## 2017-10-10 MED ORDER — HEPARIN (PORCINE) IN NACL 2-0.9 UNIT/ML-% IJ SOLN
INTRAMUSCULAR | Status: AC
  Filled 2017-10-10: qty 1000

## 2017-10-10 MED ORDER — MECLIZINE HCL 25 MG PO TABS
25.0000 mg | ORAL_TABLET | Freq: Three times a day (TID) | ORAL | Status: DC | PRN
  Administered 2017-10-10 – 2017-10-11 (×2): 25 mg via ORAL
  Filled 2017-10-10 (×2): qty 1

## 2017-10-10 MED ORDER — CARVEDILOL 6.25 MG PO TABS
6.2500 mg | ORAL_TABLET | Freq: Two times a day (BID) | ORAL | Status: DC
  Administered 2017-10-10 – 2017-10-12 (×4): 6.25 mg via ORAL
  Filled 2017-10-10 (×5): qty 1

## 2017-10-10 MED ORDER — GABAPENTIN 600 MG PO TABS
600.0000 mg | ORAL_TABLET | Freq: Four times a day (QID) | ORAL | Status: DC
  Administered 2017-10-11 – 2017-10-12 (×5): 600 mg via ORAL
  Filled 2017-10-10 (×6): qty 1

## 2017-10-10 MED ORDER — POTASSIUM CHLORIDE CRYS ER 20 MEQ PO TBCR: 20.0000 meq | EXTENDED_RELEASE_TABLET | Freq: Once | ORAL | Status: DC

## 2017-10-10 MED ORDER — FENTANYL CITRATE (PF) 100 MCG/2ML IJ SOLN
INTRAMUSCULAR | Status: AC
  Filled 2017-10-10: qty 2

## 2017-10-10 MED ORDER — HYDRALAZINE HCL 50 MG PO TABS
75.0000 mg | ORAL_TABLET | Freq: Three times a day (TID) | ORAL | Status: DC
  Administered 2017-10-11 – 2017-10-12 (×4): 75 mg via ORAL
  Filled 2017-10-10 (×5): qty 1

## 2017-10-10 MED ORDER — OXYCODONE HCL 5 MG PO TABS: 5.0000 mg | ORAL_TABLET | ORAL | Status: DC | PRN

## 2017-10-10 MED ORDER — APIXABAN 5 MG PO TABS: 5.0000 mg | ORAL_TABLET | Freq: Two times a day (BID) | ORAL | Status: DC

## 2017-10-10 MED ORDER — SODIUM CHLORIDE 0.9% FLUSH: 3.0000 mL | Freq: Two times a day (BID) | INTRAVENOUS | Status: DC

## 2017-10-10 MED ORDER — CYANOCOBALAMIN 1000 MCG/ML IJ SOLN: 1000.0000 ug | INTRAMUSCULAR | Status: DC

## 2017-10-10 SURGICAL SUPPLY — 16 items
CATH OMNI FLUSH 5F 65CM (CATHETERS) ×1 IMPLANT
COVER PRB 48X5XTLSCP FOLD TPE (BAG) IMPLANT
COVER PROBE 5X48 (BAG) ×3
DRAPE ZERO GRAVITY STERILE (DRAPES) ×1 IMPLANT
KIT ENCORE 26 ADVANTAGE (KITS) ×1 IMPLANT
KIT MICROINTRODUCER STIFF 5F (SHEATH) ×1 IMPLANT
KIT PV (KITS) ×3 IMPLANT
SHEATH PINNACLE 5F 10CM (SHEATH) ×1 IMPLANT
SHEATH PINNACLE MP 7F 45CM (SHEATH) ×1 IMPLANT
STENT OMNILINK ELITE 8X29X80 (Permanent Stent) ×1 IMPLANT
SYR MEDRAD MARK V 150ML (SYRINGE) ×3 IMPLANT
TRANSDUCER W/STOPCOCK (MISCELLANEOUS) ×3 IMPLANT
TRAY PV CATH (CUSTOM PROCEDURE TRAY) ×3 IMPLANT
WIRE BENTSON .035X145CM (WIRE) ×1 IMPLANT
WIRE ROSEN-J .035X260CM (WIRE) ×1 IMPLANT
WIRE TORQFLEX AUST .018X40CM (WIRE) ×1 IMPLANT

## 2017-10-10 NOTE — Progress Notes (Signed)
Concern over access site The area is soft, but tender Will keep patient for observation with repeat CBC in am  [facility] [place]

## 2017-10-10 NOTE — Progress Notes (Signed)
Site area: rt groin fa sheath Site Prior to Removal:  Level 0 Pressure Applied For: 20 minutes Manual:   yes Patient Status During Pull:  stable Post Pull Site:  Level 0 Post Pull Instructions Given:  yes Post Pull Pulses Present: dopplered Dressing Applied:  Gauze and tegaderm Bedrest begins @ 1315 Comments:

## 2017-10-10 NOTE — H&P (Signed)
[facility] [place]  Patient name: [name]       MRN: [id number]        DOB: Feb 15, 1951          Sex: female   REQUESTING PROVIDER:    Dr. Meyer Russel   REASON FOR CONSULT:    Left leg ulcer  HISTORY OF PRESENT ILLNESS:   [name] is a 67 y.o. female, who is referred for evaluation and management of left leg ulcer which have been present on and off for 2 years.  Most recent of which began back in September.  She has been going to the wound center.  She tells me that she feels it is getting a little bit smaller.  The patient suffers from paroxysmal atrial fibrillation which is managed with anticoagulation.  She does report having had a TIA like symptom before starting anticoagulation.  She had a nuclear stress test in 2016 which was low risk.  She is a former smoker she is medically managed for hypertension which is controlled.  She has a history of bipolar disorder.  PAST MEDICAL HISTORY        Past Medical History:  Diagnosis Date  . Anxiety   . 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  PT ALSO HAS LOWER BACK PAIN AT TIMES  . Depression   . GERD (gastroesophageal reflux disease)    RARE- OTC ANTACID IF NEEDED--PAST HX OF ULCER  . Headache    wakes up with headache   . Hyperlipidemia   . Hypertension   . Kidney stones    PT PASSED STONES--NO KNOWN STONES AT PRESENT TIMES  . Lumbago 10/02/2013  . Obesity   . OSA (obstructive sleep apnea) 12/30/2013   Very mild OSA with REM accentuation,  tested 11-29-13 at [facility] sleep, Dr Frances Furbish .   Marland Kitchen Periodic limb movement disorder (PLMD) 12/30/2013  . Restless leg syndrome   . Ulcer   . Urinary incontinence      FAMILY HISTORY        Family History  Problem Relation Age of Onset  . Sleep apnea Father   . Multiple sclerosis Sister   . Multiple sclerosis Sister     SOCIAL HISTORY:   Social History        Socioeconomic  History  . Marital status: Single    Spouse name: Not on file  . Number of children: 0  . Years of education: 54  . Highest education level: Not on file  Social Needs  . Financial resource strain: Not on file  . Food insecurity - worry: Not on file  . Food insecurity - inability: Not on file  . Transportation needs - medical: Not on file  . Transportation needs - non-medical: Not on file  Occupational History  . Not on file  Tobacco Use  . Smoking status: Former Smoker    Packs/day: 2.00    Years: 20.00    Pack years: 40.00    Types: Cigarettes    Last attempt to quit: 10/17/1988    Years since quitting: 28.8  . Smokeless tobacco: Never Used  Substance and Sexual Activity  . Alcohol use: No  . Drug use: No    Comment: Marijuana - 25 years ago  . Sexual activity: Not on file  Other Topics Concern  . Not on file  Social History Narrative   Patient is single and lives alone.   Patient is disabled.  Patient has a college education.   Patient is right-handed.   Patient drinks two cokes per day.    ALLERGIES:         Allergies  Allergen Reactions  . Zolpidem Tartrate Other (See Comments)    hallucinations  . Amoxicillin Rash    CURRENT MEDICATIONS:          Current Outpatient Medications  Medication Sig Dispense Refill  . acetaminophen (TYLENOL) 325 MG tablet Take 650 mg by mouth every 6 (six) hours as needed for mild pain.    Marland Kitchen amLODipine (NORVASC) 10 MG tablet TAKE 1 TABLET(10 MG) BY MOUTH DAILY 30 tablet 11  . buPROPion (WELLBUTRIN XL) 150 MG 24 hr tablet Take 150 mg by mouth every morning.    . carvedilol (COREG) 6.25 MG tablet Take 1 tablet (6.25 mg total) by mouth 2 (two) times daily. 180 tablet 3  . dofetilide (TIKOSYN) 250 MCG capsule Take 1 capsule (250 mcg total) by mouth 2 (two) times daily. 60 capsule 6  . ELIQUIS 5 MG TABS tablet Take 5 mg by mouth 2 (two) times daily.    . fluticasone (FLONASE) 50 MCG/ACT nasal  spray Place 1 spray daily into both nostrils.    Marland Kitchen gabapentin (NEURONTIN) 600 MG tablet Take 600 mg by mouth 4 (four) times daily.    . hydrALAZINE (APRESOLINE) 50 MG tablet Take 1.5 tablets (75 mg total) by mouth 3 (three) times daily. 135 tablet 3  . iron polysaccharides (NIFEREX) 150 MG capsule Take 150 mg by mouth daily.    Marland Kitchen lisinopril (PRINIVIL,ZESTRIL) 40 MG tablet Take 40 mg by mouth daily.    . meclizine (ANTIVERT) 25 MG tablet Take 25 mg by mouth 3 (three) times daily as needed for dizziness.    . mirtazapine (REMERON) 15 MG tablet Take 15 mg by mouth at bedtime.    . Multiple Vitamins-Minerals (MULTIVITAMIN & MINERAL PO) Take 1 tablet by mouth daily.     No current facility-administered medications for this visit.     REVIEW OF SYSTEMS:   [X]  denotes positive finding, [ ]  denotes negative finding Cardiac  Comments:  Chest pain or chest pressure:    Shortness of breath upon exertion: x   Short of breath when lying flat:    Irregular heart rhythm:        Vascular    Pain in calf, thigh, or hip brought on by ambulation:    Pain in feet at night that wakes you up from your sleep:     Blood clot in your veins:    Leg swelling:  x       Pulmonary    Oxygen at home:    Productive cough:     Wheezing:         Neurologic    Sudden weakness in arms or legs:     Sudden numbness in arms or legs:     Sudden onset of difficulty speaking or slurred speech:    Temporary loss of vision in one eye:     Problems with dizziness:         Gastrointestinal    Blood in stool:      Vomited blood:         Genitourinary    Burning when urinating:     Blood in urine:        Psychiatric    Major depression:         Hematologic    Bleeding problems:  Problems with blood clotting too easily:        Skin    Rashes or ulcers: x       Constitutional    Fever or chills:      PHYSICAL EXAM:   There were no vitals filed for this visit.  GENERAL: The patient is a well-nourished female, in no acute distress. The vital signs are documented above. CARDIAC: There is a regular rate and rhythm.  VASCULAR: Nonpalpable pedal pulses on the left. PULMONARY: Nonlabored respirations MUSCULOSKELETAL: There are no major deformities or cyanosis. NEUROLOGIC: No focal weakness or paresthesias are detected. SKIN: 2 circular 6 mm ulcers at the base of the first and fifth toe at the level of the metatarsal head.  There does appear to be granulation tissue at the base.  These are very superficial PSYCHIATRIC: The patient has a normal affect.  STUDIES:   I have reviewed her vascular lab studies from today.  The ABI on the left is 0.73.  On the right is 1.02.  She has monophasic waveforms throughout the left leg with no significant stenosis. She has a toe pressure in the 50s on the left.  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  I have recommended proceeding with angiography beginning in the right groin intervening as indicated.  I discussed the risks and benefits of the procedure.  The patient wishes to proceed, however she is going out of town to visit with her sister and would like to schedule this after January 5.  Therefore, I have scheduled her for Tuesday, January 7.  She will need to be off of her Eliquis prior to the procedure.  I did discuss that this is a limb threatening situation, and she understands.   Durene CalWells Chanse Kagel, MD [facility] Tel [phone] Pager [phone]

## 2017-10-10 NOTE — Op Note (Signed)
Patient name: [name] MRN: [id number] DOB: March 16, 1951 Sex: female  10/10/2017 Pre-operative Diagnosis: Bilateral lower extremity ulcer Post-operative diagnosis:  Same Surgeon:  [name] Procedure Performed:  1.  Ultrasound-guided access, right femoral artery  2.  Abdominal aortogram  3.  Bilateral lower extremity runoff  4.  Stent, left common iliac artery  5.  Conscious sedation (43 minutes)  \   Indications: The patient has a history of a nonhealing ulcer on her left leg.  She underwent vascular lab studies which suggested vascular insufficiency is that she is here today for further evaluation.  Procedure:  The patient was identified in the holding area and taken to room 8.  The patient was then placed supine on the table and prepped and draped in the usual sterile fashion.  A time out was called.  Conscious sedation was administered with the use of IV fentanyl and Versed under continuous physician and nurse monitoring.  Heart rate, blood pressure, and oxygen saturations were continuously monitored.  Ultrasound was used to evaluate the right common femoral artery.  It was patent .  A digital ultrasound image was acquired.  A micropuncture needle was used to access the right common femoral artery under ultrasound guidance.  An 018 wire was advanced without resistance and a micropuncture sheath was placed.  The 018 wire was removed and a benson wire was placed.  The micropuncture sheath was exchanged for a 5 french sheath.  An omniflush catheter was advanced over the wire to the level of L-1.  An abdominal angiogram was obtained.  The catheter was pulled down to the aortic bifurcation and bilateral runoff was performed  Findings:   Aortogram: No significant renal artery stenosis identified.  Bilateral renal arteries are widely patent.  The infrarenal abdominal aorta is patent throughout its course.  The right common and external iliac artery are widely patent.  There is a 70%  stenosis of the midportion of the left common iliac artery.  External iliac arteries widely patent.  Right Lower Extremity: The right common femoral profunda femoral and superficial femoral artery are patent throughout their course.  There is three-vessel runoff.  Left Lower Extremity: The left common femoral profunda femoral and superficial femoral artery are patent without stenosis.  The popliteal artery is patent and there is three-vessel runoff.  Intervention: After the above images were acquired the decision was made to proceed with intervention.  Using the Omni Flush catheter and a Bentson wire, the aortic bifurcation was crossed.  The catheter was advanced to the left external iliac artery and a Rosen wire was inserted.  The 5 French sheath was removed and a 7 JamaicaFrench 45 severe sheath was advanced into the left external iliac artery.  The patient was then fully heparinized.  I selected an 8 x 29 Omnilink balloon expandable stent.  This was deployed across the lesion in the left common iliac artery taking the balloon to rated pressure.  Completion imaging revealed resolution of the stenosis.  At this point a wire was removed and the sheath was withdrawn to the right external iliac artery.  The patient be taken to the holding area for sheath pull once the coagulation profile corrects.  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    Juleen ChinaV. Wells Javarious Elsayed, M.D. [facility] [place] Office: [phone]  Pager:  [phone]

## 2017-10-10 NOTE — Discharge Instructions (Signed)

## 2017-10-10 NOTE — Progress Notes (Signed)
Dr. Myra GianottiBrabham notified concerning patient complain of tenderness and inflammation in right groin. Patient ambulated after bedrest.  Right groin site remains tender and has questionable swelling in groin concerning for hematoma after ambulating.  Patient to be admitted per Dr. Myra GianottiBrabham.

## 2017-10-11 ENCOUNTER — Observation Stay (HOSPITAL_COMMUNITY): Payer: PPO

## 2017-10-11 ENCOUNTER — Other Ambulatory Visit: Payer: Self-pay

## 2017-10-11 ENCOUNTER — Encounter (HOSPITAL_COMMUNITY): Payer: Self-pay | Admitting: Surgery

## 2017-10-11 ENCOUNTER — Telehealth: Payer: Self-pay | Admitting: Surgery

## 2017-10-11 DIAGNOSIS — I70248 Atherosclerosis of native arteries of left leg with ulceration of other part of lower left leg: Secondary | ICD-10-CM | POA: Diagnosis present

## 2017-10-11 DIAGNOSIS — Z881 Allergy status to other antibiotic agents status: Secondary | ICD-10-CM | POA: Diagnosis not present

## 2017-10-11 DIAGNOSIS — Z888 Allergy status to other drugs, medicaments and biological substances status: Secondary | ICD-10-CM | POA: Diagnosis not present

## 2017-10-11 DIAGNOSIS — D62 Acute posthemorrhagic anemia: Secondary | ICD-10-CM | POA: Diagnosis not present

## 2017-10-11 DIAGNOSIS — Z87442 Personal history of urinary calculi: Secondary | ICD-10-CM | POA: Diagnosis not present

## 2017-10-11 DIAGNOSIS — F319 Bipolar disorder, unspecified: Secondary | ICD-10-CM | POA: Diagnosis present

## 2017-10-11 DIAGNOSIS — G4733 Obstructive sleep apnea (adult) (pediatric): Secondary | ICD-10-CM | POA: Diagnosis present

## 2017-10-11 DIAGNOSIS — L97529 Non-pressure chronic ulcer of other part of left foot with unspecified severity: Secondary | ICD-10-CM | POA: Diagnosis present

## 2017-10-11 DIAGNOSIS — G2581 Restless legs syndrome: Secondary | ICD-10-CM | POA: Diagnosis present

## 2017-10-11 DIAGNOSIS — M79609 Pain in unspecified limb: Secondary | ICD-10-CM | POA: Diagnosis not present

## 2017-10-11 DIAGNOSIS — F419 Anxiety disorder, unspecified: Secondary | ICD-10-CM | POA: Diagnosis present

## 2017-10-11 DIAGNOSIS — M170 Bilateral primary osteoarthritis of knee: Secondary | ICD-10-CM | POA: Diagnosis present

## 2017-10-11 DIAGNOSIS — I480 Paroxysmal atrial fibrillation: Secondary | ICD-10-CM | POA: Diagnosis present

## 2017-10-11 DIAGNOSIS — Z820 Family history of epilepsy and other diseases of the nervous system: Secondary | ICD-10-CM | POA: Diagnosis not present

## 2017-10-11 DIAGNOSIS — Z6841 Body Mass Index (BMI) 40.0 and over, adult: Secondary | ICD-10-CM | POA: Diagnosis not present

## 2017-10-11 DIAGNOSIS — K219 Gastro-esophageal reflux disease without esophagitis: Secondary | ICD-10-CM | POA: Diagnosis present

## 2017-10-11 DIAGNOSIS — M545 Low back pain: Secondary | ICD-10-CM | POA: Diagnosis present

## 2017-10-11 DIAGNOSIS — R112 Nausea with vomiting, unspecified: Secondary | ICD-10-CM | POA: Diagnosis not present

## 2017-10-11 DIAGNOSIS — G4761 Periodic limb movement disorder: Secondary | ICD-10-CM | POA: Diagnosis present

## 2017-10-11 DIAGNOSIS — L97829 Non-pressure chronic ulcer of other part of left lower leg with unspecified severity: Secondary | ICD-10-CM | POA: Diagnosis present

## 2017-10-11 DIAGNOSIS — I70245 Atherosclerosis of native arteries of left leg with ulceration of other part of foot: Secondary | ICD-10-CM | POA: Diagnosis present

## 2017-10-11 DIAGNOSIS — E785 Hyperlipidemia, unspecified: Secondary | ICD-10-CM | POA: Diagnosis present

## 2017-10-11 DIAGNOSIS — I10 Essential (primary) hypertension: Secondary | ICD-10-CM | POA: Diagnosis present

## 2017-10-11 DIAGNOSIS — Z87891 Personal history of nicotine dependence: Secondary | ICD-10-CM | POA: Diagnosis not present

## 2017-10-11 LAB — BASIC METABOLIC PANEL
Anion gap: 11 (ref 5–15)
BUN: 11 mg/dL (ref 6–20)
CHLORIDE: 105 mmol/L (ref 101–111)
CO2: 20 mmol/L — AB (ref 22–32)
CREATININE: 0.99 mg/dL (ref 0.44–1.00)
Calcium: 8.9 mg/dL (ref 8.9–10.3)
GFR calc Af Amer: >60 mL/min (ref >60)
GFR calc non Af Amer: 58 mL/min — ABNORMAL LOW (ref >60)
GLUCOSE: 140 mg/dL — AB (ref 65–99)
Potassium: 3.9 mmol/L (ref 3.5–5.1)
Sodium: 136 mmol/L (ref 135–145)

## 2017-10-11 LAB — CBC
HEMATOCRIT: 32.1 % — AB (ref 36.0–46.0)
HEMATOCRIT: 29.2 % — AB (ref 36.0–46.0)
Hemoglobin: 9.7 g/dL — ABNORMAL LOW (ref 12.0–15.0)
Hemoglobin: 8.8 g/dL — ABNORMAL LOW (ref 12.0–15.0)
MCH: 26.4 pg (ref 26.0–34.0)
MCH: 26.3 pg (ref 26.0–34.0)
MCHC: 30.2 g/dL (ref 30.0–36.0)
MCHC: 30.1 g/dL (ref 30.0–36.0)
MCV: 87.5 fL (ref 78.0–100.0)
MCV: 87.4 fL (ref 78.0–100.0)
Platelets: 328 10*3/uL (ref 150–400)
Platelets: 318 10*3/uL (ref 150–400)
RBC: 3.67 MIL/uL — ABNORMAL LOW (ref 3.87–5.11)
RBC: 3.34 MIL/uL — ABNORMAL LOW (ref 3.87–5.11)
RDW: 16.1 % — ABNORMAL HIGH (ref 11.5–15.5)
RDW: 16.5 % — AB (ref 11.5–15.5)
WBC: 9.2 10*3/uL (ref 4.0–10.5)
WBC: 8.8 10*3/uL (ref 4.0–10.5)

## 2017-10-11 LAB — GLUCOSE, CAPILLARY: Glucose-Capillary: 130 mg/dL — ABNORMAL HIGH (ref 65–99)

## 2017-10-11 MED ORDER — PROMETHAZINE HCL 25 MG/ML IJ SOLN
12.5000 mg | Freq: Four times a day (QID) | INTRAMUSCULAR | Status: DC | PRN
  Administered 2017-10-11: 12.5 mg via INTRAVENOUS
  Filled 2017-10-11: qty 1

## 2017-10-11 MED FILL — Morphine Sulfate Inj 4 MG/ML: INTRAMUSCULAR | Qty: 0.5 | Status: AC

## 2017-10-11 NOTE — Progress Notes (Signed)
@  [id number] Dr. Myra GianottiBrabham paged regarding pt's hematoma increasing in size (previously slightly larger than a golf ball, now slightly larger than a fist). At approx. 0345 pt was assisted in standing and pivoting to bedside commode from bed. After returning to bed, pt became very nauseated and began vomiting. Pt felt sudden pain in R groin access site while retching. Zofran administered and site immediately assessed and found to be firmer and larger. Pressure applied to site. Pt endorsed pain subsiding soon after emesis ended. R DP pulse palpable (+2).  @0425  Dr. Myra GianottiBrabham repaged (1st page no response). Hematoma appears stable in size, still slightly larger than a fist pulse stable (R DP +2).  @0428  Dr. Myra GianottiBrabham returned page, reordered phenergan as PRN and endorsed to hold pressure to site if it started to get larger. Will administer and continue to assess.

## 2017-10-11 NOTE — Care Management Note (Signed)
Case Management Note Donn PieriniKristi Lien Lyman RN, BSN Unit 4E-Case Manager [phone]  Patient Details  Name: [name] MRN: [id number] Date of Birth: 1951/09/22  Subjective/Objective:    Pt presented with PAD- s/p aortogram with post op anemia- and N/V               Action/Plan: PTA pt lived at home- referral for medication concerns regarding cost- spoke with pt at bedside- pt has [employer]- states that she is on Eliquis and Tikosyn- both copay cost went up for 2019- and she reports that she falls into donut hole- she has already spoken to her insurance provider regarding cost- there is no assistance that can be provided to pt from hospital - also encouraged pt to call drug providers when she gets into donut hole to see if she would qualify for any assistance at that time as there are programs for that. CM to follow for any further transition needs.   Expected Discharge Date:  10/11/17               Expected Discharge Plan:  Home/Self Care  In-House Referral:     Discharge planning Services  CM Consult, Medication Assistance  Post Acute Care Choice:    Choice offered to:     DME Arranged:    DME Agency:     HH Arranged:    HH Agency:     Status of Service:  In process, will continue to follow  If discussed at Long Length of Stay Meetings, dates discussed:    Additional Comments:  Darrold SpanWebster, Decklyn Hornik Hall, RN 10/11/2017, 3:24 PM

## 2017-10-11 NOTE — Telephone Encounter (Signed)
Sched appt 01/25/18; labs at 8:00 and NP at 9:15. Mailed appt letter.

## 2017-10-11 NOTE — Progress Notes (Signed)
     Re check groin, soft without frank hematoma. Right groin limited duplex: no evidence of pseudoaneurysm or hematoma.  Pending CBC for hematocrit check.  She has been up to the bedside toilet x 1 without new incidence of increased pain sensation in the right groin.  Still nausea present, but she did tolerate some lunch today without vomiting.    Hopefully her symptoms will continue to improve and she can go home tomorrow.   Mosetta PigeonEmma Maureen Marriana Hibberd PA-C

## 2017-10-11 NOTE — Progress Notes (Signed)
Right groin limited duplex: no evidence of pseudoaneurysm or hematoma. Farrel DemarkJill Eunice, RDMS, RVT

## 2017-10-11 NOTE — Progress Notes (Signed)
    Subjective  - POD #1  Patient was kept overnight for right groin pain. She got up to go the bathroom early this morning and there was concern that she had a new knot in her right groin.  She is also complaining of significant nausea which was not relieved with Zofran but did get some relief with Phenergan.   Physical Exam:  Right groin remains tender.  There is a mild fullness.       Assessment/Plan:  POD #1  Acute blood loss anemia: The patient has had a five-point drop in her hematocrit.  I do not think that she has a significant retroperitoneal hematoma.  It all appears anterior.  I am going to continue to monitor her in the hospital.  I will check a CBC later on this afternoon.  I will get a duplex ultrasound to make sure that she does not have a pseudoaneurysm in her right groin.    Wells Treyce Spillers 10/11/2017 11:39 AM --  Vitals:   10/11/17 0600 10/11/17 0820  BP: 135/71 137/77  Pulse:  71  Resp: 17 18  Temp:  98 F (36.7 C)  SpO2:  96%    Intake/Output Summary (Last 24 hours) at 10/11/2017 1139 Last data filed at 10/11/2017 1000 Gross per 24 hour  Intake 885.83 ml  Output 270 ml  Net 615.83 ml     Laboratory CBC    Component Value Date/Time   WBC 9.2 10/11/2017 0321   HGB 9.7 (L) 10/11/2017 0321   HGB 10.9 (L) 06/27/2017 1239   HCT 32.1 (L) 10/11/2017 0321   HCT 34.9 06/27/2017 1239   PLT 328 10/11/2017 0321   PLT 429 (H) 06/27/2017 1239    BMET    Component Value Date/Time   NA 136 10/11/2017 0321   NA 143 06/27/2017 1239   K 3.9 10/11/2017 0321   CL 105 10/11/2017 0321   CO2 20 (L) 10/11/2017 0321   GLUCOSE 140 (H) 10/11/2017 0321   BUN 11 10/11/2017 0321   BUN 21 06/27/2017 1239   CREATININE 0.99 10/11/2017 0321   CREATININE 0.92 04/21/2015 1138   CALCIUM 8.9 10/11/2017 0321   GFRNONAA 58 (L) 10/11/2017 0321   GFRNONAA 66 04/21/2015 1138   GFRAA >60 10/11/2017 0321   GFRAA 76 04/21/2015 1138    COAG Lab Results  Component  Value Date   INR 1.11 03/22/2017   INR 0.95 11/13/2014   INR 0.98 02/10/2012   No results found for: PTT  Antibiotics Anti-infectives (From admission, onward)   None       V. Charlena CrossWells Avalin Briley IV, M.D. [facility] [place] [facility] Office: [phone] Pager:  [phone]

## 2017-10-11 NOTE — Telephone Encounter (Signed)
-----   Message from Sharee PimpleMarilyn K McChesney, RN sent at 10/10/2017 12:32 PM EST ----- Regarding: 3 months   ----- Message ----- From: Nada LibmanBrabham, Vance W, MD Sent: 10/10/2017  11:29 AM To: Vvs Charge Pool  10-10-2017:  Surgeon:  Durene CalWells Brabham Procedure Performed:  1.  Ultrasound-guided access, right femoral artery  2.  Abdominal aortogram  3.  Bilateral lower extremity runoff  4.  Stent, left common iliac artery  5.  Conscious sedation (43 minutes)  F/u suzanne 3 months with abi and aorto iliac duplex

## 2017-10-12 ENCOUNTER — Telehealth: Payer: Self-pay | Admitting: Surgery

## 2017-10-12 MED ORDER — OXYCODONE HCL 5 MG PO TABS: 5.0000 mg | ORAL_TABLET | Freq: Four times a day (QID) | ORAL | 0 refills | Status: DC | PRN

## 2017-10-12 NOTE — Telephone Encounter (Signed)
-----   Message from Sharee PimpleMarilyn K McChesney, RN sent at 10/12/2017 12:11 PM EST ----- Regarding: addendum to previous postop message   ----- Message ----- From: Lars Mageollins, Emma M, PA-C Sent: 10/12/2017  11:28 AM To: Vvs Charge Pool  Keep existing appt in 3 months.  Dr. Myra GianottiBrabham also wants to see in 1 month with Aortoiliac duplex and ABI's  S/P right groin access stick for angiogram and iliac stent placement.

## 2017-10-12 NOTE — Telephone Encounter (Signed)
Sched labs 11/08/17 at 8:00 and MD 11/13/17 at 10:15. Lm on hm# to inform pt of appts.

## 2017-10-12 NOTE — Progress Notes (Addendum)
[facility] [place]  Subjective  - Patient resting well this am.  She got up 2 more times to use bedside toilet yesterday evening without difficulty per nursing.   Objective 131/77 69 98.5 F (36.9 C) (Oral) 13 98%  Intake/Output Summary (Last 24 hours) at 10/12/2017 0715 Last data filed at 10/11/2017 1900 Gross per 24 hour  Intake 1080 ml  Output -  Net 1080 ml   Right groin with moderate ecchymosis, soft without frank hematoma Heart RRR 60-70 bpm Lungs non labored breathing    Assessment/Planning: POD # 2  Procedure Performed:             1.  Ultrasound-guided access, right femoral artery             2.  Abdominal aortogram             3.  Bilateral lower extremity runoff             4.  Stent, left common iliac artery   Post op she developed a small hematoma, pressure was held by nursing staff.  The hematoma has resolved and a duplex showed no active pseudoaneursym development.  Post op anemia.  HGB today in decreased to 8.8 from 9.7.  Given that she was receiving IV fluids and is asymptomatic with normal BP of 120/58 and HR of 61 we fill this a delusional HGB.    OK to resume Eliquis.  Resolved nausea tolerating PO's and she has ambulated in the halls with nursing.  We will discharge her home in stable condition with a follow up appointment in 1 months.  We will order follow up labs to include Aortoiliac Duplex and repeat ABI's.  She will see her Primary care physician in February we will send him a copy of today's note for reference and request follow up lab work to check her HGB/Hematicrit.    [name] 10/12/2017 7:15 AM --  Laboratory Lab Results: Recent Labs    10/11/17 0321 10/11/17 1419  WBC 9.2 8.8  HGB 9.7* 8.8*  HCT 32.1* 29.2*  PLT 328 318   BMET Recent Labs    10/10/17 0736 10/11/17 0321  NA 141 136  K 3.5 3.9  CL 108 105  CO2  --  20*  GLUCOSE 121* 140*  BUN 16 11  CREATININE 1.10* 0.99  CALCIUM   --  8.9    COAG Lab Results  Component Value Date   INR 1.11 03/22/2017   INR 0.95 11/13/2014   INR 0.98 02/10/2012   No results found for: PTT

## 2017-10-12 NOTE — Progress Notes (Signed)
Pt to be discharged home with friend. IV and telemetry box removed. Pt received discharge paperwork and all questions were answered. Pt has all belongings packed. [facility] pt to [facility].    Berdine DanceLauren Moffitt BSN, RN

## 2017-10-13 ENCOUNTER — Ambulatory Visit: Payer: PPO | Admitting: Physician Assistant

## 2017-10-17 ENCOUNTER — Other Ambulatory Visit: Payer: Self-pay

## 2017-10-17 DIAGNOSIS — I739 Peripheral vascular disease, unspecified: Secondary | ICD-10-CM

## 2017-10-17 NOTE — Discharge Summary (Signed)
[facility] Discharge Summary   Patient ID:  [name] MRN: [id number] DOB/AGE: 06-20-1951 68 y.o.  Admit date: 10/10/2017 Discharge date: 10/12/2017 Date of Surgery: 10/10/2017 Surgeon: Surgeon(s): Nada Libman, MD  Admission Diagnosis: pvd  Discharge Diagnoses:  pvd  Secondary Diagnoses: Past Medical History:  Diagnosis Date  . Anxiety   . 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  PT ALSO HAS LOWER BACK PAIN AT TIMES  . Depression   . GERD (gastroesophageal reflux disease)    RARE- OTC ANTACID IF NEEDED--PAST HX OF ULCER  . Headache    wakes up with headache   . Hyperlipidemia   . Hypertension   . Kidney stones    PT PASSED STONES--NO KNOWN STONES AT PRESENT TIMES  . Lumbago 10/02/2013  . Obesity   . OSA (obstructive sleep apnea) 12/30/2013   Very mild OSA with REM accentuation,  tested 11-29-13 at [facility] sleep, Dr Frances Furbish .   Marland Kitchen Periodic limb movement disorder (PLMD) 12/30/2013  . Restless leg syndrome   . Ulcer   . Urinary incontinence     Procedure(s): ABDOMINAL AORTOGRAM W/LOWER EXTREMITY PERIPHERAL VASCULAR INTERVENTION  Discharged Condition: good  HPI: 67 y/o female seen for left leg non healing ulcer.  This has been a chronic problem appearing on and off over a 2 year period.    Pre procedure ABI on the left is 0.73. On the right is 1.02. She has monophasic waveforms throughout the left leg with no significant stenosis. She has a toe pressure in the 50s on the left.     Hospital Course:  [name] is a 67 y.o. female is S/P  Procedure(s): ABDOMINAL AORTOGRAM W/LOWER EXTREMITY PERIPHERAL VASCULAR INTERVENTION  Findings:              Aortogram: No significant renal artery stenosis identified.  Bilateral renal arteries are widely patent.  The infrarenal abdominal aorta is patent throughout its course.  The right common and external iliac artery are widely patent.  There is a 70% stenosis of the midportion of  the left common iliac artery.  External iliac arteries widely patent.             Right Lower Extremity: The right common femoral profunda femoral and superficial femoral artery are patent throughout their course.  There is three-vessel runoff.             Left Lower Extremity: The left common femoral profunda femoral and superficial femoral artery are patent without stenosis.  The popliteal artery is patent and there is three-vessel runoff.     Left common iliac stent was placed.  She was monitored over night secondary to tenderness over the stick access site.  She developed nausea that was managed with Zofran and Phenergan.  Right groin remained tender with mild fullness.  Acute blood loss anemia with a five-point drop in hematocrit.      Duplex ultrasound of the right groin revealed no evidence of pseudoaneurysm or hematoma.    Post op day 2 her nausea and vomiting have resolved the right groin has ecchymosis, but is soft without evidence of frank hematoma.  She was re started on her Eliquis and discharged home in stable condition.     follow up appointment in 1 months.  We will order follow up labs to include Aortoiliac Duplex and repeat ABI's.          Significant Diagnostic Studies: CBC Lab Results  Component  Value Date   WBC 8.8 10/11/2017   HGB 8.8 (L) 10/11/2017   HCT 29.2 (L) 10/11/2017   MCV 87.4 10/11/2017   PLT 318 10/11/2017    BMET    Component Value Date/Time   NA 136 10/11/2017 0321   NA 143 06/27/2017 1239   K 3.9 10/11/2017 0321   CL 105 10/11/2017 0321   CO2 20 (L) 10/11/2017 0321   GLUCOSE 140 (H) 10/11/2017 0321   BUN 11 10/11/2017 0321   BUN 21 06/27/2017 1239   CREATININE 0.99 10/11/2017 0321   CREATININE 0.92 04/21/2015 1138   CALCIUM 8.9 10/11/2017 0321   GFRNONAA 58 (L) 10/11/2017 0321   GFRNONAA 66 04/21/2015 1138   GFRAA >60 10/11/2017 0321   GFRAA 76 04/21/2015 1138   COAG Lab Results  Component Value Date   INR 1.11 03/22/2017   INR 0.95  11/13/2014   INR 0.98 02/10/2012     Disposition:  Discharge to :Home Discharge Instructions    Call MD for:  redness, tenderness, or signs of infection (pain, swelling, bleeding, redness, odor or green/yellow discharge around incision site)   Complete by:  As directed    Call MD for:  severe or increased pain, loss or decreased feeling  in affected limb(s)   Complete by:  As directed    Call MD for:  temperature >100.5   Complete by:  As directed    Discharge instructions   Complete by:  As directed    You may shower daily PRN.  Walk for exercise.   Driving Restrictions   Complete by:  As directed    No driving for 1 week   Lifting restrictions   Complete by:  As directed    No heavy  lifting for 2-3 weeks   Resume previous diet   Complete by:  As directed      Allergies as of 10/12/2017      Reactions   Zolpidem Tartrate Other (See Comments)   hallucinations   Amoxicillin Rash, Other (See Comments)   Has patient had a PCN reaction causing immediate rash, facial/tongue/throat swelling, SOB or lightheadedness with hypotension: Yes Has patient had a PCN reaction causing severe rash involving mucus membranes or skin necrosis: No Has patient had a PCN reaction that required hospitalization: No Has patient had a PCN reaction occurring within the last 10 years: Yes If all of the above answers are "NO", then may proceed with Cephalosporin use.      Medication List    TAKE these medications   acetaminophen 325 MG tablet Commonly known as:  TYLENOL Take 325-650 mg by mouth every 6 (six) hours as needed for mild pain or headache.   amLODipine 10 MG tablet Commonly known as:  NORVASC TAKE 1 TABLET(10 MG) BY MOUTH DAILY   buPROPion 150 MG 24 hr tablet Commonly known as:  WELLBUTRIN XL Take 300 mg by mouth daily.   carvedilol 6.25 MG tablet Commonly known as:  COREG Take 1 tablet (6.25 mg total) by mouth 2 (two) times daily.   cyanocobalamin 1000 MCG/ML  injection Commonly known as:  (VITAMIN B-12) Inject 1,000 mcg into the muscle every 30 (thirty) days.   dofetilide 250 MCG capsule Commonly known as:  TIKOSYN Take 1 capsule (250 mcg total) by mouth 2 (two) times daily.   ELIQUIS 5 MG Tabs tablet Generic drug:  apixaban Take 5 mg by mouth 2 (two) times daily. Notes to patient:  You did not receive this medication during your  hospital stay. You may resume it after discharge.   fluticasone 50 MCG/ACT nasal spray Commonly known as:  FLONASE Place 1 spray into both nostrils daily as needed for allergies.   gabapentin 600 MG tablet Commonly known as:  NEURONTIN Take 600 mg by mouth 4 (four) times daily.   hydrALAZINE 50 MG tablet Commonly known as:  APRESOLINE Take 1.5 tablets (75 mg total) by mouth 3 (three) times daily.   iron polysaccharides 150 MG capsule Commonly known as:  NIFEREX Take 150 mg by mouth daily.   lisinopril 40 MG tablet Commonly known as:  PRINIVIL,ZESTRIL Take 40 mg by mouth daily.   meclizine 25 MG tablet Commonly known as:  ANTIVERT Take 25 mg by mouth 3 (three) times daily as needed for dizziness.   mirtazapine 15 MG tablet Commonly known as:  REMERON Take 15 mg by mouth at bedtime.   MULTIVITAMIN & MINERAL PO Take 1 tablet by mouth daily. Notes to patient:  You did not receive this medication during your hospital stay. You may resume it after discharge.   oxyCODONE 5 MG immediate release tablet Commonly known as:  Oxy IR/ROXICODONE Take 1 tablet (5 mg total) by mouth every 6 (six) hours as needed for moderate pain.      Verbal and written Discharge instructions given to the patient. Wound care per Discharge AVS Follow-up Information    Nada Libman, MD Follow up in 4 week(s).   Specialties:  Vascular Surgery, Cardiology Why:  office will call for appt. Contact information: [address] [place] [address] [phone]           Signed: Mosetta Pigeon 10/17/2017, 10:15  AM

## 2017-10-20 ENCOUNTER — Encounter: Payer: Self-pay | Admitting: Pharmacist

## 2017-10-20 ENCOUNTER — Telehealth: Payer: Self-pay | Admitting: Pharmacist

## 2017-10-20 ENCOUNTER — Encounter: Payer: PPO | Admitting: Physician Assistant

## 2017-10-20 ENCOUNTER — Other Ambulatory Visit: Payer: Self-pay | Admitting: Nurse Practitioner

## 2017-10-20 DIAGNOSIS — G603 Idiopathic progressive neuropathy: Secondary | ICD-10-CM | POA: Diagnosis not present

## 2017-10-20 DIAGNOSIS — L97522 Non-pressure chronic ulcer of other part of left foot with fat layer exposed: Secondary | ICD-10-CM | POA: Diagnosis not present

## 2017-10-20 DIAGNOSIS — L97529 Non-pressure chronic ulcer of other part of left foot with unspecified severity: Secondary | ICD-10-CM | POA: Diagnosis not present

## 2017-10-20 DIAGNOSIS — L97512 Non-pressure chronic ulcer of other part of right foot with fat layer exposed: Secondary | ICD-10-CM | POA: Diagnosis not present

## 2017-10-20 NOTE — Patient Outreach (Signed)
[facility]) Care Management  [facility]   10/20/2017  [name] Aug 23, 1951 [id number]  67 y.o. year old female referred to [facility] pharmacy for Transitions Of Care (Pharmacy call-Medication Reconciliation 30 day Post Discharge) Per chart review: HPI - Patient recently admitted for non healing left foot ulcer, discharged 10/12/2017. ABIs decreased in left lower extremity, stenosis of midportion found in left common iliac artery therefore eliquis was held and a stent was placed in left common iliac artery. Patient experienced acute blood loss anemia. Eliquis was resumed at discharge and patient is to follow up for evaluation in ~ 1 month for repeat aortoiliac duplex, ABIs, and other lab work.  PMH s/f: HTN, atrial fibrillation, PAD with h/o nonhealing wound on left foot x2 years, obstructive sleep apnea, chest pain and dyspnea on exertion, morbid obesity, neuropathy (patient reported), TIA (patient reported).  Patient with [employer] advantage plan.     Patient confirms identity with HIPAA-identifiers x2 and gives verbal consent to speak over the phone about medications.    Subjective:  Patient reports she is trying to avoid taking pain medicines and has not taken any oxycodone. Pain managed with acetaminophen ~1000 mg/day. Denies issues with constipation. Denies issues with bleeding or bruising with Eliquis. Cites "weakness, slurred speech" as s/sx of stroke when asked. Denies falls ever. Reports energy level has been "not that good". Reports 100% adherence with medication. She puts daily meds in a small container and wears on person.     She states that she stopped smoking 25-30 years ago, became an  everyday smoker at age 59, smoked 15 years x 0.75 packs per day. States she has never been evaluated for COPD and denies any shortness of breath at rest, but does upon exertion. She attributes this to being overweight and out of shape.  Pt reports her BP  typically runs 110s-130s/60s-70s mmHg, HR 70-80s BPM.   She reports she was on a statin medication at one time but this was stopped "a while ago because my numbers were really good".   Endorses need for help with cost of medications as she is on limited income and between deductible and donut hole later in the year, cost is overwhelming.  Patient reports she has follow up with Dr. Eloise Harman in a few weeks for repeat lab draws.  Objective:  Last SCr = 0.99 as of 10/11/2017 Age <80 years Weight > 60 kg Estimated Creatinine Clearance: 72.3 mL/min (by C-G formula based on SCr of 0.99 mg/dL). Lipid panel 10/20/2014 = LDL 85, HDL 59, TC 161, TG 85  Encounter Medications: Outpatient Encounter Medications as of 10/20/2017  Medication Sig  . acetaminophen (TYLENOL) 325 MG tablet Take 325-650 mg by mouth every 6 (six) hours as needed for mild pain or headache.   Marland Kitchen amLODipine (NORVASC) 10 MG tablet TAKE 1 TABLET(10 MG) BY MOUTH DAILY  . buPROPion (WELLBUTRIN XL) 150 MG 24 hr tablet Take 300 mg by mouth daily.   . carvedilol (COREG) 6.25 MG tablet Take 1 tablet (6.25 mg total) by mouth 2 (two) times daily.  . cyanocobalamin (,VITAMIN B-12,) 1000 MCG/ML injection Inject 1,000 mcg into the muscle every 30 (thirty) days.  Marland Kitchen dofetilide (TIKOSYN) 250 MCG capsule Take 1 capsule (250 mcg total) by mouth 2 (two) times daily.  Marland Kitchen ELIQUIS 5 MG TABS tablet Take 5 mg by mouth 2 (two) times daily.  . fluticasone (FLONASE) 50 MCG/ACT nasal spray Place 1 spray into both nostrils daily as needed  for allergies.   Marland Kitchen. gabapentin (NEURONTIN) 600 MG tablet Take 600 mg by mouth 4 (four) times daily.  . hydrALAZINE (APRESOLINE) 50 MG tablet Take 1.5 tablets (75 mg total) by mouth 3 (three) times daily.  . iron polysaccharides (NIFEREX) 150 MG capsule Take 150 mg by mouth daily.  Marland Kitchen. lisinopril (PRINIVIL,ZESTRIL) 40 MG tablet Take 40 mg by mouth daily.  . meclizine (ANTIVERT) 25 MG tablet Take 25 mg by mouth 3 (three) times daily  as needed for dizziness.  . mirtazapine (REMERON) 15 MG tablet Take 15 mg by mouth at bedtime.  . Multiple Vitamins-Minerals (MULTIVITAMIN & MINERAL PO) Take 1 tablet by mouth daily.  Marland Kitchen. oxyCODONE (OXY IR/ROXICODONE) 5 MG immediate release tablet Take 1 tablet (5 mg total) by mouth every 6 (six) hours as needed for moderate pain.   No facility-administered encounter medications on file as of 10/20/2017.     Functional Status: In your present state of health, do you have any difficulty performing the following activities: 10/11/2017 10/10/2017  Hearing? - N  Vision? - N  Difficulty concentrating or making decisions? - N  Walking or climbing stairs? - Y  Dressing or bathing? - N  Doing errands, shopping? Y -  Some recent data might be hidden    Fall/Depression Screening: No flowsheet data found. PHQ 2/9 Scores 04/21/2015 02/24/2015  PHQ - 2 Score 0 0    ASSESSMENT: Date Discharged from Hospital: 10/12/17 Date Medication Reconciliation Performed: 10/20/2017  Medications Discontinued at Discharge:   none  New Medications at Discharge:  Oxycodone 5 mg every 6 hours as needed for moderate pain  Patient was recently discharged from hospital and all medications have been reviewed  Drugs sorted by system:  Neurologic/Psychologic:mirtazapine, bupropion  Cardiovascular:lisinopril, eliquis, hydralazine, dofetilide, carvedilol, amlodipine  Pulmonary/Allergy:fluticasone nasal spray  Gastrointestinal:meclizine  Pain:oxycodone, gabapentin, acetaminophen  Vitamins/Minerals:multivitamin, iron polyscaharides, vitamin B12 inj  Duplications in therapy: none Gaps in therapy: lipid panel, statin with PAD findings Medications to avoid in the elderly: none clinically concerning Drug interactions: none clinically significant Other issues noted: none   PLAN: -Instructed patient to take new medications as prescribed -Patient to ask Dr. Eloise HarmanPaterson about lipid panel and need for statin therapy,  especially in light of recently found stenosis of iliac artery. -Recommended to patient that she follow up with Dr. Eloise HarmanPaterson about a COPD assessment. -Completed low income subsidy application over the phone. Patient to call when she receives letter with decision.  -No other pharmacy needs identified at this time.   Will send barriers letter to Dr. Eloise HarmanPaterson regarding lipid panel/statin and need for COPD assessment. Otherwise, pharmacy signing off.   Allena Katzaroline E Welles, Pharm.D., BCPS PGY2 Ambulatory Care Pharmacy Resident Phone: [phone]

## 2017-10-22 NOTE — Progress Notes (Signed)
BRANDEY, VANDALEN ([id number]) Visit Report for 10/20/2017 Arrival Information Details Patient Name: [name] Date of Service: 10/20/2017 12:30 PM Medical Record Number: [id number] Patient Account Number: [id number] Date of Birth/Sex: [birth year] (67 y.o. Female) Treating RN: Montey Hora Primary Care Preslie Depasquale: [name] Other Clinician: Referring Serine Kea: [name] Treating Greg Eckrich/Extender: Melburn Hake, HOYT Weeks in Treatment: 12 Visit Information History Since Last Visit Added or deleted any medications: No Patient Arrived: Cane Any new allergies or adverse reactions: No Arrival Time: 12:46 Had a fall or experienced change in No Accompanied By: self activities of daily living that may affect Transfer Assistance: [name] risk of falls: Patient Identification Verified: Yes Signs or symptoms of abuse/neglect since last visito No Secondary Verification Process Yes Hospitalized since last visit: No Completed: Has Dressing in Place as Prescribed: Yes Patient Has Alerts: Yes Pain Present Now: No Patient Alerts: Patient on Blood Thinner Eliquis Electronic Signature(s) Signed: 10/20/2017 4:37:30 PM By: Montey Hora Entered By: Montey Hora on 10/20/2017 12:46:53 [name] ([id number]) -------------------------------------------------------------------------------- Encounter Discharge Information Details Patient Name: [name] Date of Service: 10/20/2017 12:30 PM Medical Record Number: [id number] Patient Account Number: [id number] Date of Birth/Sex: [birth year] (66 y.o. Female) Treating RN: Montey Hora Primary Care Kaylanni Ezelle: [name] Other Clinician: Referring Kirby Cortese: [name] Treating Kanetra Ho/Extender: Melburn Hake, HOYT Weeks in Treatment: 12 Encounter Discharge Information Items Discharge Pain Level: 0 Discharge Condition: Stable Ambulatory Status: Cane Discharge Destination: Home Transportation: Private  Auto Accompanied By: self Schedule Follow-up Appointment: Yes Medication Reconciliation completed and No provided to Patient/Care Nelani Schmelzle: Provided on Clinical Summary of Care: 10/20/2017 Form Type Recipient Paper Patient CS Electronic Signature(s) Signed: 10/20/2017 3:45:30 PM By: Montey Hora Entered By: Montey Hora on 10/20/2017 15:45:30 [name] ([id number]) -------------------------------------------------------------------------------- Lower Extremity Assessment Details Patient Name: [name] Date of Service: 10/20/2017 12:30 PM Medical Record Number: [id number] Patient Account Number: [id number] Date of Birth/Sex: 11-11-1950 (66 y.o. Female) Treating RN: Montey Hora Primary Care Natalina Wieting: [name] Other Clinician: Referring Reyann Troop: [name] Treating Miyo Aina/Extender: Melburn Hake, HOYT Weeks in Treatment: 12 Vascular Assessment Pulses: Dorsalis Pedis Palpable: [Left:Yes] [Right:Yes] Posterior Tibial Extremity colors, hair growth, and conditions: Extremity Color: [Left:Normal] [Right:Normal] Hair Growth on Extremity: [Left:No] [Right:No] Temperature of Extremity: [Left:Warm] [Right:Warm] Capillary Refill: [Left:< 3 seconds] [Right:< 3 seconds] Electronic Signature(s) Signed: 10/20/2017 4:37:30 PM By: Montey Hora Entered By: Montey Hora on 10/20/2017 13:02:08 [name] ([id number]) -------------------------------------------------------------------------------- Multi Wound Chart Details Patient Name: [name] Date of Service: 10/20/2017 12:30 PM Medical Record Number: [id number] Patient Account Number: [id number] Date of Birth/Sex: 1950-10-21 (66 y.o. Female) Treating RN: Montey Hora Primary Care Crews Mccollam: [name] Other Clinician: Referring Karena Kinker: [name] Treating Florence Antonelli/Extender: STONE III, HOYT Weeks in Treatment: 12 Vital Signs Height(in): 64 Pulse(bpm): Weight(lbs):  262 Blood Pressure(mmHg): Body Mass Index(BMI): 45 Temperature(F): 98.0 Respiratory Rate 18 (breaths/min): Photos: [1:No Photos] [2:No Photos] [3:No Photos] Wound Location: [1:Left Metatarsal head first - Plantar] [2:Left Metatarsal head fifth - Plantar] [3:Right Metatarsal head first - Plantar] Wounding Event: [1:Gradually Appeared] [2:Trauma] [3:Gradually Appeared] 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80%] [2:69.60%] [3:30.80%] Classification: [1:Full Thickness  With Exposed Support Structures] [2:Full Thickness With Exposed Support Structures] [3:Full Thickness With Exposed Support Structures] Exudate Amount: [1:Large] [2:Large] [3:Medium] Exudate Type: [1:Serosanguineous] [2:Serosanguineous] [3:Serous] Exudate Color: [1:red, brown] [2:red, brown] [3:amber] Wound Margin: [1:Distinct, outline attached] [2:Distinct, outline attached] [3:Flat and Intact] Granulation Amount: [1:Large (67-100%)] [2:Large (67-100%)] [3:Large (67-100%)] Granulation Quality: [1:Red] [2:Red, Hyper-granulation] [3:Pink] Necrotic Amount: [1:Small (1-33%)] [2:Small (1-33%)] [3:Small (1-33%)] Necrotic Tissue: [1:Eschar, Adherent Slough] [2:Adherent Slough] [3:Adherent Slough] Exposed Structures: [1:Fat Layer (Subcutaneous Tissue) Exposed: Yes Fascia: No Tendon: No Muscle: No Joint: No Bone: No] [2:Fat Layer  (Subcutaneous Tissue) Exposed: Yes Fascia: No Tendon: No Muscle: No Joint: No Bone: No] [3:Fat Layer (Subcutaneous Tissue) Exposed: Yes  Fascia: No Tendon: No Muscle: No Joint: No Bone: No] Epithelialization: [1:[name]] [2:[name]] [3:[name]] Periwound Skin Texture: [1:Callus: Yes Excoriation: No] [2:Callus: Yes Excoriation: No] [3:Callus: Yes Excoriation: No] Induration: No Induration: No Induration: No Crepitus: No Crepitus: No Crepitus: No Rash: No Rash: No Rash: No Scarring: No Scarring: No Scarring: No Periwound Skin Moisture: Maceration: No Maceration: No Maceration: No Dry/Scaly: No Dry/Scaly: No Dry/Scaly: No Periwound Skin Color: Atrophie Blanche: No Atrophie Blanche: No Atrophie Blanche: No Cyanosis: No Cyanosis: No Cyanosis: No Ecchymosis: No Ecchymosis: No Ecchymosis: No Erythema: No Erythema: No Erythema: No Hemosiderin Staining: No Hemosiderin Staining: No Hemosiderin Staining: No Mottled: No Mottled: No Mottled: No Pallor: No Pallor: No Pallor: No Rubor: No Rubor: No Rubor: No Temperature: No Abnormality No Abnormality No Abnormality Tenderness on Palpation: Yes Yes Yes Wound Preparation: Ulcer Cleansing: Ulcer Cleansing: Ulcer Cleansing: Rinsed/Irrigated with Saline Rinsed/Irrigated with Saline Rinsed/Irrigated with Saline Topical Anesthetic Applied: Topical Anesthetic Applied: Topical Anesthetic Applied: Other: lidocaine 4% Other: lidocaine 4% Other: lidocaine 4% Treatment Notes Electronic Signature(s) Signed: 10/20/2017 4:37:30 PM By: Montey Hora Entered By: Montey Hora on 10/20/2017 13:15:45 [name] ([id number]) -------------------------------------------------------------------------------- Multi-Disciplinary Care Plan Details Patient Name: [name] Date of Service: 10/20/2017 12:30 PM Medical Record Number: [id number] Patient Account Number: [id number] Date of Birth/Sex: [birth year] (66 y.o. Female) Treating  RN: Montey Hora Primary Care Jolly Bleicher: [name] Other Clinician: Referring Dawnn Nam: [name] Treating Zylon Creamer/Extender: Melburn Hake, HOYT Weeks in Treatment: 12 Active Inactive ` Abuse / Safety / Falls / Self Care Management Nursing Diagnoses: Potential for falls Goals: Patient will not experience any injury related to falls Date Initiated: 07/24/2017 Target Resolution Date: 09/30/2017 Goal Status: Active Interventions: Assess fall risk on admission and as needed Notes: ` Orientation to the Wound Care Program Nursing Diagnoses: Knowledge deficit related to the wound healing center program Goals: Patient/caregiver will verbalize understanding of the [facility] Date Initiated: 07/24/2017 Target Resolution Date: 09/30/2017 Goal Status: Active Interventions: Provide education on orientation to the wound center Notes: ` Wound/Skin Impairment Nursing Diagnoses: Impaired tissue integrity Goals: Ulcer/skin breakdown will heal within 14 weeks Date Initiated: 07/24/2017 Target Resolution Date: 09/30/2017 Goal Status: Active Interventions: [facility], PASTER IMarland Kitchen ([id number]) Assess patient/caregiver ability to obtain necessary supplies Assess patient/caregiver ability to perform ulcer/skin care regimen upon admission and as needed Assess ulceration(s) every visit Notes: Electronic Signature(s) Signed: 10/20/2017 4:37:30 PM By: Montey Hora Entered By: Montey Hora on 10/20/2017 13:14:50 [name] ([id number]) -------------------------------------------------------------------------------- Pain Assessment Details Patient Name: [name] Date of Service: 10/20/2017 12:30 PM Medical Record Number: [id number] Patient Account Number: [id number] Date of Birth/Sex: Feb 19, 1951 (66 y.o. Female) Treating RN: Montey Hora Primary Care Tannie Koskela: [name] Other Clinician: Referring Star Resler: [name] Treating  Chaunta Bejarano/Extender: STONE III, HOYT Weeks in Treatment: 12 Active Problems Location of Pain  Severity and Description of Pain Patient Has Paino No Site Locations Pain Management and Medication Current Pain Management: Electronic Signature(s) Signed: 10/20/2017 4:37:30 PM By: Montey Hora Entered By: Montey Hora on 10/20/2017 12:47:51 [name] ([id number]) -------------------------------------------------------------------------------- Patient/Caregiver Education Details Patient Name: [name] Date of Service: 10/20/2017 12:30 PM Medical Record Number: [id number] Patient Account Number: [id number] Date of Birth/Gender: 1951-04-18 (66 y.o. Female) Treating RN: Montey Hora Primary Care Physician: [name] Other Clinician: Referring Physician: [name] Treating Physician/Extender: Sharalyn Ink in Treatment: 12 Education Assessment Education Provided To: Patient Education Topics Provided Wound/Skin Impairment: Handouts: Other: wound care as ordered Methods: Demonstration, Explain/Verbal Responses: State content correctly Electronic Signature(s) Signed: 10/20/2017 4:37:30 PM By: Montey Hora Entered By: Montey Hora on 10/20/2017 15:45:48 [name] ([id number]) -------------------------------------------------------------------------------- Wound Assessment Details Patient Name: [name] Date of Service: 10/20/2017 12:30 PM Medical Record Number: [id number] Patient Account Number: [id number] Date of Birth/Sex: 1951/07/24 (66 y.o. Female) Treating RN: Montey Hora Primary Care Laporcha Marchesi: [name] Other Clinician: Referring Tomma Ehinger: [name] Treating Shayley Medlin/Extender: Melburn Hake, HOYT Weeks in Treatment: 12 Wound Status Wound Number: 1 Primary Etiology: Neuropathic Ulcer-Non Diabetic Wound Location: Left Metatarsal head first - Plantar Wound Status: Open Wounding Event: Gradually  Appeared Comorbid History: Arrhythmia, Hypertension, Neuropathy Date Acquired: 01/22/2017 Weeks Of Treatment: 12 Clustered Wound: No Photos Photo Uploaded By: Gretta Cool, BSN, RN, CWS, Kim on 10/20/2017 17:44:08 Wound Measurements Length: (cm) 0.2 Width: (cm) 0.4 Depth: (cm) 0.3 Area: (cm) 0.063 Volume: (cm) 0.019 % Reduction in Area: 50% % Reduction in Volume: 69.8% Epithelialization: [name] Tunneling: No Undermining: No Wound Description Full Thickness With Exposed Support Classification: Structures Wound Margin: Distinct, outline attached Exudate Large Amount: Exudate Type: Serosanguineous Exudate Color: red, brown Foul Odor After Cleansing: No Slough/Fibrino Yes Wound Bed Granulation Amount: Large (67-100%) Exposed Structure Granulation Quality: Red Fascia Exposed: No Necrotic Amount: Small (1-33%) Fat Layer (Subcutaneous Tissue) Exposed: Yes Necrotic Quality: Eschar, Adherent Slough Tendon Exposed: No Muscle Exposed: No Joint Exposed: No Bone Exposed: No Nick, Dusti I. ([id number]) Periwound Skin Texture Texture Color No Abnormalities Noted: No No Abnormalities Noted: No Callus: Yes Atrophie Blanche: No Crepitus: No Cyanosis: No Excoriation: No Ecchymosis: No Induration: No Erythema: No Rash: No Hemosiderin Staining: No Scarring: No Mottled: No Pallor: No Moisture Rubor: No No Abnormalities Noted: No Dry / Scaly: No Temperature / Pain Maceration: No Temperature: No Abnormality Tenderness on Palpation: Yes Wound Preparation Ulcer Cleansing: Rinsed/Irrigated with Saline Topical Anesthetic Applied: Other: lidocaine 4%, Treatment Notes Wound #1 (Left, Plantar Metatarsal head first) 1. Cleansed with: Clean wound with Normal Saline 2. Anesthetic Topical Lidocaine 4% cream to wound bed prior to debridement 4. Dressing Applied: Other dressing (specify in notes) 5. Secondary Dressing Applied Foam Kerlix/Conform 6. Footwear/Offloading device  applied Felt/Foam 7. Secured with Tape Notes silvercel on L 5th met head and endoform on bilateral 1st met heads Electronic Signature(s) Signed: 10/20/2017 4:37:30 PM By: Montey Hora Entered By: Montey Hora on 10/20/2017 12:56:04 [name] ([id number]) -------------------------------------------------------------------------------- Wound Assessment Details Patient Name: [name] Date of Service: 10/20/2017 12:30 PM Medical Record Number: [id number] Patient Account Number: [id number] Date of Birth/Sex: 1951/06/14 (66 y.o. Female) Treating RN: Montey Hora Primary Care Ambrose Wile: [name] Other Clinician: Referring Kishan Wachsmuth: [name] Treating Colon Rueth/Extender: STONE III, HOYT Weeks in Treatment: 12 Wound Status Wound Number: 2 Primary Etiology: Neuropathic Ulcer-Non Diabetic Wound Location: Left Metatarsal head fifth - Plantar Wound Status: Open Wounding Event: Trauma Comorbid History: Arrhythmia, Hypertension, Neuropathy Date  Acquired: 05/27/2017 Weeks Of Treatment: 12 Clustered Wound: No Photos Photo Uploaded By: Gretta Cool, BSN, RN, CWS, Kim on 10/20/2017 17:44:26 Wound Measurements Length: (cm) 0.5 Width: (cm) 0.4 Depth: (cm) 0.2 Area: (cm) 0.157 Volume: (cm) 0.031 % Reduction in Area: 84.6% % Reduction in Volume: 69.6% Epithelialization: [name] Tunneling: No Undermining: No Wound Description Full Thickness With Exposed Support Classification: Structures Wound Margin: Distinct, outline attached Exudate Large Amount: Exudate Type: Serosanguineous Exudate Color: red, brown Foul Odor After Cleansing: No Slough/Fibrino Yes Wound Bed Granulation Amount: Large (67-100%) Exposed Structure Granulation Quality: Red, Hyper-granulation Fascia Exposed: No Necrotic Amount: Small (1-33%) Fat Layer (Subcutaneous Tissue) Exposed: Yes Necrotic Quality: Adherent Slough Tendon Exposed: No Muscle Exposed: No Joint Exposed: No Bone  Exposed: No [name], Yvette I. ([id number]) Periwound Skin Texture Texture Color No Abnormalities Noted: No No Abnormalities Noted: No Callus: Yes Atrophie Blanche: No Crepitus: No Cyanosis: No Excoriation: No Ecchymosis: No Induration: No Erythema: No Rash: No Hemosiderin Staining: No Scarring: No Mottled: No Pallor: No Moisture Rubor: No No Abnormalities Noted: No Dry / Scaly: No Temperature / Pain Maceration: No Temperature: No Abnormality Tenderness on Palpation: Yes Wound Preparation Ulcer Cleansing: Rinsed/Irrigated with Saline Topical Anesthetic Applied: Other: lidocaine 4%, Treatment Notes Wound #2 (Left, Plantar Metatarsal head fifth) 1. Cleansed with: Clean wound with Normal Saline 2. Anesthetic Topical Lidocaine 4% cream to wound bed prior to debridement 4. Dressing Applied: Other dressing (specify in notes) 5. Secondary Dressing Applied Foam Kerlix/Conform 6. Footwear/Offloading device applied Felt/Foam 7. Secured with Tape Notes silvercel on L 5th met head and endoform on bilateral 1st met heads Electronic Signature(s) Signed: 10/20/2017 4:37:30 PM By: Montey Hora Entered By: Montey Hora on 10/20/2017 12:56:14 [name] ([id number]) -------------------------------------------------------------------------------- Wound Assessment Details Patient Name: [name] Date of Service: 10/20/2017 12:30 PM Medical Record Number: [id number] Patient Account Number: [id number] Date of Birth/Sex: 01-27-1951 (66 y.o. Female) Treating RN: Montey Hora Primary Care Nthony Lefferts: [name] Other Clinician: Referring Lowry Bala: [name] Treating Shatasia Cutshaw/Extender: Melburn Hake, HOYT Weeks in Treatment: 12 Wound Status Wound Number: 3 Primary Etiology: Neuropathic Ulcer-Non Diabetic Wound Location: Right Metatarsal head first - Plantar Wound Status: Open Wounding Event: Gradually Appeared Comorbid History: Arrhythmia,  Hypertension, Neuropathy Date Acquired: 10/05/2017 Weeks Of Treatment: 2 Clustered Wound: No Photos Photo Uploaded By: Gretta Cool, BSN, RN, CWS, Kim on 10/20/2017 17:44:41 Wound Measurements Length: (cm) 0.4 Width: (cm) 0.3 Depth: (cm) 0.1 Area: (cm) 0.094 Volume: (cm) 0.009 % Reduction in Area: -49.2% % Reduction in Volume: 30.8% Epithelialization: [name] Tunneling: No Undermining: No Wound Description Full Thickness With Exposed Support Classification: Structures Wound Margin: Flat and Intact Exudate Medium Amount: Exudate Type: Serous Exudate Color: amber Foul Odor After Cleansing: No Slough/Fibrino No Wound Bed Granulation Amount: Large (67-100%) Exposed Structure Granulation Quality: Pink Fascia Exposed: No Necrotic Amount: Small (1-33%) Fat Layer (Subcutaneous Tissue) Exposed: Yes Necrotic Quality: Adherent Slough Tendon Exposed: No Muscle Exposed: No Joint Exposed: No Bone Exposed: No [name], Yvette I. ([id number]) Periwound Skin Texture Texture Color No Abnormalities Noted: No No Abnormalities Noted: No Callus: Yes Atrophie Blanche: No Crepitus: No Cyanosis: No Excoriation: No Ecchymosis: No Induration: No Erythema: No Rash: No Hemosiderin Staining: No Scarring: No Mottled: No Pallor: No Moisture Rubor: No No Abnormalities Noted: No Dry / Scaly: No Temperature / Pain Maceration: No Temperature: No Abnormality Tenderness on Palpation: Yes Wound Preparation Ulcer Cleansing: Rinsed/Irrigated with Saline Topical Anesthetic Applied: Other: lidocaine 4%, Treatment Notes Wound #3 (Right, Plantar Metatarsal head first) 1. Cleansed with: Clean wound with  Normal Saline 2. Anesthetic Topical Lidocaine 4% cream to wound bed prior to debridement 4. Dressing Applied: Other dressing (specify in notes) 5. Secondary Dressing Applied Foam Kerlix/Conform 6. Footwear/Offloading device applied Felt/Foam 7. Secured with Tape Notes silvercel on L 5th  met head and endoform on bilateral 1st met heads Electronic Signature(s) Signed: 10/20/2017 4:37:30 PM By: Montey Hora Entered By: Montey Hora on 10/20/2017 12:56:24 [name] ([id number]) -------------------------------------------------------------------------------- [place] Details Patient Name: [name] Date of Service: 10/20/2017 12:30 PM Medical Record Number: [id number] Patient Account Number: [id number] Date of Birth/Sex: 07/07/1951 (66 y.o. Female) Treating RN: Montey Hora Primary Care Chaylee Ehrsam: [name] Other Clinician: Referring Hassani Sliney: [name] Treating Arieana Somoza/Extender: Melburn Hake, HOYT Weeks in Treatment: 12 Vital Signs Time Taken: 12:47 Temperature (F): 98.0 Height (in): 64 Respiratory Rate (breaths/min): 18 Weight (lbs): 262 Reference Range: 80 - 120 mg / dl Body Mass Index (BMI): 45 Electronic Signature(s) Signed: 10/20/2017 4:37:30 PM By: Montey Hora Entered By: Montey Hora on 10/20/2017 12:48:19

## 2017-10-24 NOTE — Progress Notes (Signed)
[name], [name] ([id number]) Visit Report for 10/20/2017 Chief Complaint Document Details Patient Name: [name], [name] Date of Service: 10/20/2017 12:30 PM Medical Record Number: [id number] Patient Account Number: [id number] Date of Birth/Sex: 1950/11/02 (67 y.o. Female) Treating RN: Curtis Sites Primary Care Provider: Jarome Matin Other Clinician: Referring Provider: Jarome Matin Treating Provider/Extender: Linwood Dibbles, Khristine Verno Weeks in Treatment: 12 Information Obtained from: Patient Chief Complaint Patient presents to the wound care center for a consult due non healing wound to the left plantar foot which she's had on and off for a number of years Electronic Signature(s) Signed: 10/21/2017 12:23:43 AM By: Lenda Kelp PA-C Entered By: Lenda Kelp on 10/20/2017 13:08:39 [name] ([id number]) -------------------------------------------------------------------------------- Debridement Details Patient Name: [name] Date of Service: 10/20/2017 12:30 PM Medical Record Number: [id number] Patient Account Number: [id number] Date of Birth/Sex: Feb 18, 1951 (67 y.o. Female) (67 y.o. Female) Treating RN: Curtis Sites Primary Care Provider: Jarome Matin Other Clinician: Referring Provider: Jarome Matin Treating Provider/Extender: Linwood Dibbles, Keyri Salberg Weeks in Treatment: 12 Debridement Performed for Wound #3 Right,Plantar Metatarsal head first Assessment: Performed By: Physician STONE III, Alyxander Kollmann E., PA-C Debridement: Debridement Pre-procedure Verification/Time Yes - 13:17 Out Taken: Start Time: 13:17 Pain Control: Lidocaine 4% Topical Solution Level: Skin/Subcutaneous Tissue Total Area Debrided (L x W): 0.4 (cm) x 0.3 (cm) = 0.12 (cm) Tissue and other material Viable, Non-Viable, Callus, Fibrin/Slough, Subcutaneous debrided: Instrument: Curette Bleeding: Minimum Hemostasis Achieved: Pressure End Time: 13:20 Procedural Pain: 0 Post Procedural Pain:  0 Response to Treatment: Procedure was tolerated well Post Debridement Measurements of Total Wound Length: (cm) 0.4 Width: (cm) 0.3 Depth: (cm) 0.2 Volume: (cm) 0.019 Character of Wound/Ulcer Post Debridement: Improved Post Procedure Diagnosis Same as Pre-procedure Electronic Signature(s) Signed: 10/20/2017 4:37:30 PM By: Curtis Sites Signed: 10/21/2017 12:23:43 AM By: Lenda Kelp PA-C Entered By: Curtis Sites on 10/20/2017 13:21:59 [name] ([id number]) -------------------------------------------------------------------------------- Debridement Details Patient Name: [name] Date of Service: 10/20/2017 12:30 PM Medical Record Number: [id number] Patient Account Number: [id number] Date of Birth/Sex: 1950-11-10 (67 y.o. Female) (67 y.o. Female) Treating RN: Curtis Sites Primary Care Provider: Jarome Matin Other Clinician: Referring Provider: Jarome Matin Treating Provider/Extender: Linwood Dibbles, Myquan Schaumburg Weeks in Treatment: 12 Debridement Performed for Wound #1 Left,Plantar Metatarsal head first Assessment: Performed By: Physician STONE III, Rhen Kawecki E., PA-C Debridement: Debridement Pre-procedure Verification/Time Yes - 13:20 Out Taken: Start Time: 13:20 Pain Control: Lidocaine 4% Topical Solution Level: Skin/Subcutaneous Tissue Total Area Debrided (L x W): 0.2 (cm) x 0.4 (cm) = 0.08 (cm) Tissue and other material Viable, Non-Viable, Callus, Fibrin/Slough, Subcutaneous debrided: Instrument: Curette Bleeding: Minimum Hemostasis Achieved: Pressure End Time: 13:24 Procedural Pain: 0 Post Procedural Pain: 0 Response to Treatment: Procedure was tolerated well Post Debridement Measurements of Total Wound Length: (cm) 0.2 Width: (cm) 0.4 Depth: (cm) 0.2 Volume: (cm) 0.013 Character of Wound/Ulcer Post Debridement: Improved Post Procedure Diagnosis Same as Pre-procedure Electronic Signature(s) Signed: 10/20/2017 4:37:30 PM By: Curtis Sites Signed:  10/21/2017 12:23:43 AM By: Lenda Kelp PA-C Entered By: Curtis Sites on 10/20/2017 13:24:52 [name] ([id number]) -------------------------------------------------------------------------------- Debridement Details Patient Name: [name] Date of Service: 10/20/2017 12:30 PM Medical Record Number: [id number] Patient Account Number: [id number] Date of Birth/Sex: 14-Aug-1951 (67 y.o. Female) (67 y.o. Female) Treating RN: Curtis Sites Primary Care Provider: Jarome Matin Other Clinician: Referring Provider: Jarome Matin Treating Provider/Extender: Linwood Dibbles, Juno Alers Weeks in Treatment: 12 Debridement Performed for Wound #2 Left,Plantar Metatarsal head fifth Assessment: Performed By: Physician STONE III, Clarivel Callaway E., PA-C Debridement: Debridement Pre-procedure Verification/Time Yes - 13:24 Out  Taken: Start Time: 13:24 Pain Control: Lidocaine 4% Topical Solution Level: Skin/Subcutaneous Tissue Total Area Debrided (L x W): 0.5 (cm) x 0.4 (cm) = 0.2 (cm) Tissue and other material Viable, Non-Viable, Callus, Fibrin/Slough, Subcutaneous debrided: Instrument: Curette Bleeding: Minimum Hemostasis Achieved: Pressure End Time: 13:28 Procedural Pain: 0 Post Procedural Pain: 0 Response to Treatment: Procedure was tolerated well Post Debridement Measurements of Total Wound Length: (cm) 0.5 Width: (cm) 0.4 Depth: (cm) 0.3 Volume: (cm) 0.047 Character of Wound/Ulcer Post Debridement: Improved Post Procedure Diagnosis Same as Pre-procedure Electronic Signature(s) Signed: 10/20/2017 4:37:30 PM By: Curtis Sites Signed: 10/21/2017 12:23:43 AM By: Lenda Kelp PA-C Entered By: Curtis Sites on 10/20/2017 13:26:55 [name] ([id number]) -------------------------------------------------------------------------------- HPI Details Patient Name: [name] Date of Service: 10/20/2017 12:30 PM Medical Record Number: [id number] Patient Account Number:  [id number] Date of Birth/Sex: February 28, 1951 (67 y.o. Female) (67 y.o. Female) Treating RN: Curtis Sites Primary Care Provider: Jarome Matin Other Clinician: Referring Provider: Jarome Matin Treating Provider/Extender: Linwood Dibbles, Lachlan Pelto Weeks in Treatment: 12 History of Present Illness Location: if plantar foot in the region of the first metatarsal head and the fifth metatarsal head Quality: Patient reports No Pain. Severity: Patient states wound are getting worse. Duration: Patient has had the wound for > 3 months prior to seeking treatment at the wound center Context: The wound would happen gradually Modifying Factors: Other treatment(s) tried include:is been under the care of [facility] Associated Signs and Symptoms: Patient reports having increase swelling. HPI Description: 67 year old patient referred to was from [facility] orthopedics where she was seen by Dr. Laverta Baltimore team saw her for a left foot ulcer which was there for about 4 months. She was seen in the emergency room on 06/05/2017 and they did a MRI which ruled out osteomyelitis and they placed her on antibiotics, which was Keflex for 3 weeks. She was found to have plantar ulcers in the region of the great toe and on the fifth metatarsal head. 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 on examination the patient was found to have 2 ulcers on the plantar aspect of the left forefoot the one on the fifth metatarsal head was 2-1/2 cm in diameter and down to the muscle. There was a lot of hypertrophic granulation tissue. It did not probe down to bone. Note the patient was seen in July of this year by Dr. 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 She  was recommended a hallux MP joint arthrodesis to correct the bunion and anti-tic conditions and she would weight-bear in a cam boot postoperatively. Her most recent MRI done on September 12 -- IMPRESSION: Skin wound at the fifth MTP joint without underlying abscess, septic joint or osteomyelitis.Hallux valgus and first MTP osteoarthritis. Midfoot degenerative change also noted. 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 Right toe brachial index of 0.52 and left of 0.4 suggestive of abnormal arterial flow at rest. left flow was monophasic on the dorsalis pedis and biphasic on the right side. 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 08/10/2017 -- she has upcoming appointments to see Dr. Victorino Dike and also to see the vascular surgeons at [facility]. Other than that she is doing well and being compliant with a dressing. 08/14/2017 -- she saw Dr. Victorino Dike earlier this morning and his note is pending. She has not yet had her appointment at the vascular office as yet. 08/21/2017 -- we have not yet received the consultation note from Dr. Victorino Dike and how vascular appointment is later this week. 08/28/17 on evaluation today patient appears to be doing fairly well in regard to the ulcers on her left lower extremity. She did have her arterial study with the following results. According to left arterial duplex study patient's left lower extremity arterial system is patent without evidence of hemodynamically significant stenosis. Peak systolic velocity of the bed superficial femoral artery is suggestive of a 30-49% stenosis with wall irregularity however the ratio is less than 1.5 Fortunately, she  tells me that she does have an appointment scheduled for January 8 [name] as well to further evaluate this. No fevers, chills, nausea, or vomiting noted at this time. Overall she is pleased with how things have been progressing. 09/07/2017 -- -- was seen by Dr. Durene Cal, who reviewed her vascular lab studies which showed ABI of 0.73 on the left and 1.02 on the right and monophasic waveforms throughout the left leg with no significant stenosis. Toe pressure was 50 on Orozco, Yvette I. ([id number]) the left. He has recommended proceeding with angiography and this has been scheduled for early January as per the patientos request. 10/05/17 on evaluation today patient's wound appears to be doing fairly well at this point. She has been recommended to go forward with an angiogram and that had been rescheduled but at this point in time is actually scheduled for the 15th of this month which is next week. Unfortunately this had to be pushed back. This was due to her blood thinner medication which she thought she needed to stop a different day. Nonetheless her wound does not appear to be too bad in regard to the [facility] surface of the foot is just not showing good signs of improving. Basically she is maintaining. Unfortunately she did have a new area on the right plantar surface of her foot which was incidentally noted by patient and right and suggested to [place] to look at and unfortunately she had a crack callous that revealed a small ulcer underneath. The good news is she has much better blood flow to the site. The bad news is obviously she now has an additional wound. 10/20/17 on evaluation today patient presents following her angiogram with subsequent stent placement of the left, and iliac artery. Since her procedure her blood flow does seem to be better. 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 She had no significant outflow or runoff disease bilaterally. Obviously this is good news. This was Dr. Myra Gianotti who performed the procedure and that was on 10/10/17 she does have fault evaluation with them for repeat ABI's and arterial studies on 11/08/17 Electronic Signature(s) Signed: 10/21/2017 12:23:43 AM By: Lenda Kelp PA-C Entered By: Lenda Kelp on 10/20/2017 17:44:34 [name] ([id number]) -------------------------------------------------------------------------------- Physical Exam Details Patient Name: [name] Date of Service: 10/20/2017 12:30 PM Medical Record Number: [id number] Patient Account Number: [id number] Date of Birth/Sex: 1950-10-13 (67 y.o. Female) Treating RN: Dorthy,  Mardene Celeste Primary Care Provider: Jarome Matin Other Clinician: Referring Provider: Jarome Matin Treating Provider/Extender: STONE III, Emberlynn Riggan Weeks in Treatment: 12 Constitutional Well-nourished and well-hydrated in no acute distress. Respiratory normal breathing without difficulty. Psychiatric this patient is able to make decisions and demonstrates good insight into disease process. Alert and Oriented x 3. pleasant and cooperative. Notes YPatient's wounds especially in regard to the two plantar wounds at the first metatarsal region seem to be doing much better she did have some callous buildup around both of these as well is the lateral left foot wound. Unfortunately the lateral left foot wound also has additional undermining which the other two do not have. I did sharply debride all three wounds today which he tolerated well without complication and only minimal discomfort. The two first metatarsal wound locations appear to be doing excellent post debridement and removal of callous. Subsequently the left lateral foot wound post debridement did reveal a bit more undermining I did remove much of the callous from surrounding but there did appear to still be  undermining nonetheless. I still feel like this is better and definitely she has better blood flow. Electronic Signature(s) Signed: 10/21/2017 12:23:43 AM By: Lenda Kelp PA-C Entered By: Lenda Kelp on 10/20/2017 17:45:48 [name] ([id number]) -------------------------------------------------------------------------------- Physician Orders Details Patient Name: [name] Date of Service: 10/20/2017 12:30 PM Medical Record Number: [id number] Patient Account Number: [id number] Date of Birth/Sex: January 21, 1951 (67 y.o. 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 o May Shower, gently pat wound dry prior to applying new dressing. Wound #2 Left,Plantar Metatarsal head fifth o Clean wound with Normal Saline. o May Shower, gently pat wound dry prior to applying new dressing. Wound #3 Right,Plantar Metatarsal head first o Clean wound with Normal Saline. o May Shower, gently pat wound dry prior to applying new dressing. Anesthetic (add to Medication List) Wound #1 Left,Plantar Metatarsal head first o Topical Lidocaine 4% cream applied to wound bed prior to debridement (In Clinic Only). Wound #2 Left,Plantar Metatarsal head fifth o Topical Lidocaine 4% cream applied to wound bed prior to debridement (In Clinic Only). Wound #3  Right,Plantar Metatarsal head first o Topical Lidocaine 4% cream applied to wound bed prior to debridement (In Clinic Only). Primary Wound Dressing Wound #1 Left,Plantar Metatarsal head first o Other: - endoform Wound #2 Left,Plantar Metatarsal head fifth o Silvercel Non-Adherent Wound #3 Right,Plantar Metatarsal head first o Other: - endoform Secondary Dressing Orozco, Yvette I. ([id number]) Wound #1 Left,Plantar Metatarsal head first o Gauze and Kerlix/Conform o Gauze and Kerlix/Conform o Foam - for offloading o Other - felt for offloading Wound #2 Left,Plantar Metatarsal head fifth o Gauze and Kerlix/Conform o Foam - for offloading o Other - felt for offloading Wound #3 Right,Plantar Metatarsal head first o Gauze and Kerlix/Conform o Foam - for offloading o Other - felt for offloading Dressing Change Frequency Wound #1 Left,Plantar Metatarsal head first o Change dressing every other day. Wound #2 Left,Plantar Metatarsal head fifth o Change  dressing every other day. Wound #3 Right,Plantar Metatarsal head first o Change dressing every other day. Follow-up Appointments Wound #1 Left,Plantar Metatarsal head first o Return Appointment in 1 week. Wound #2 Left,Plantar Metatarsal head fifth o Return Appointment in 1 week. Wound #3 Right,Plantar Metatarsal head first o Return Appointment in 1 week. Edema Control Wound #1 Left,Plantar Metatarsal head first o Elevate legs to the level of the heart and pump ankles as often as possible Wound #2 Left,Plantar Metatarsal head fifth o Elevate legs to the level of the heart and pump ankles as often as possible Wound #3 Right,Plantar Metatarsal head first o Elevate legs to the level of the heart and pump ankles as often as possible Off-Loading Wound #1 Left,Plantar Metatarsal head first o Open toe surgical shoe to: - left foot, felt Wound #2 Left,Plantar Metatarsal head fifth o Open  toe surgical shoe to: - left foot, felt Wound #3 Right,Plantar Metatarsal head first o Open toe surgical shoe to: - left foot, felt [name], Yvette I. ([id number]) Additional Orders / Instructions Wound #1 Left,Plantar Metatarsal head first o Increase protein intake. Wound #2 Left,Plantar Metatarsal head fifth o Increase protein intake. Wound #3 Right,Plantar Metatarsal head first o Increase protein intake. Electronic Signature(s) Signed: 10/20/2017 4:37:30 PM By: Curtis Sites Signed: 10/21/2017 12:23:43 AM By: Lenda Kelp PA-C Entered By: Curtis Sites on 10/20/2017 13:29:29 [name] ([id number]) -------------------------------------------------------------------------------- Problem List Details Patient Name: [name] Date of Service: 10/20/2017 12:30 PM Medical Record Number: [id number] Patient Account Number: [id number] Date of Birth/Sex: May 30, 1951 (67 y.o. 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 Female) Treating RN: Curtis Sites Primary Care Provider: Jarome Matin Other Clinician: Referring Provider: Jarome Matin Treating Provider/Extender: Linwood Dibbles, Mylea Roarty Weeks in Treatment: 12 Subjective Chief Complaint Information obtained from Patient Patient presents to the wound care center for a consult due non healing wound to the left plantar foot which she's had on and off for a number of years History of Present Illness (HPI) The following HPI elements were documented for the patient's wound: Location: if plantar foot in the region of the first metatarsal head and the fifth metatarsal head Quality: Patient reports No Pain. Severity: Patient states wound are getting worse. Duration: Patient has had the wound for > 3 months prior to seeking treatment at the wound center Context: The wound would happen gradually Modifying Factors: Other treatment(s) tried include:is been under the care of [facility] orthopedics Associated Signs and Symptoms: Patient reports having increase swelling. 67 year old patient referred to was from [facility] orthopedics where she was seen by Dr. Laverta Baltimore team saw her for a left foot ulcer which was there for about 4 months. She was seen in  the emergency room on 06/05/2017 and they did a MRI which ruled out osteomyelitis and they placed her on antibiotics, which was Keflex for 3 weeks. She was found to have plantar ulcers in the region of the great toe and on the fifth metatarsal head. 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 on examination the patient was found to have 2 ulcers on the plantar aspect of the left forefoot the one on the fifth metatarsal  head was 2-1/2 cm in diameter and down to the muscle. There was a lot of hypertrophic granulation tissue. It did not probe down to bone. Note the patient was seen in July of this year by Dr. 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 She was recommended a hallux MP joint arthrodesis to correct the bunion and anti-tic conditions and she would weight-bear in a cam boot postoperatively. Her most recent MRI done on September 12 -- IMPRESSION: Skin wound at the fifth MTP joint without underlying abscess, septic joint or osteomyelitis.Hallux valgus and first MTP osteoarthritis. Midfoot degenerative change also noted. 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 Right toe brachial index of 0.52 and left of 0.4 suggestive of abnormal arterial flow at rest. left flow was monophasic on the dorsalis pedis and biphasic on the right side. 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 08/10/2017 -- she has upcoming appointments to see Dr. Victorino Dike and also to see the vascular surgeons at [facility]. Other than that she is doing well and being compliant with a dressing. 08/14/2017 -- she saw Dr. Victorino Dike earlier this morning and his note is pending. She has not yet had her appointment at the vascular office as yet. 08/21/2017 -- we have not yet received the consultation note from Dr. Victorino Dike and how vascular appointment is later this week. 08/28/17 on evaluation today patient appears to be doing fairly well in regard to the ulcers on her left lower extremity. She did have her arterial  study with the following results. According to left arterial duplex study patient's left lower extremity arterial system is patent without evidence of Orozco, Yvette I. ([id number]) hemodynamically significant stenosis. Peak systolic velocity of the bed superficial femoral artery is suggestive of a 30-49% stenosis with wall irregularity however the ratio is less than 1.5 Fortunately, she tells me that she does have an appointment scheduled for January 8 [name] as well to further evaluate this. No fevers, chills, nausea, or vomiting noted at this time. Overall she is pleased with how things have been progressing. 09/07/2017 -- -- was seen by Dr. Durene Cal, who reviewed her vascular lab studies which showed ABI of 0.73 on the left and 1.02 on the right and monophasic waveforms throughout the left leg with no significant stenosis. Toe pressure was 50 on the left. He has recommended proceeding with angiography and this has been scheduled for early January as per the patient s request. 10/05/17 on evaluation today patient's wound appears to be doing fairly well at  this point. She has been recommended to go forward with an angiogram and that had been rescheduled but at this point in time is actually scheduled for the 15th of this month which is next week. Unfortunately this had to be pushed back. This was due to her blood thinner medication which she thought she needed to stop a different day. Nonetheless her wound does not appear to be too bad in regard to the [facility] surface of the foot is just not showing good signs of improving. Basically she is maintaining. Unfortunately she did have a new area on the right plantar surface of her foot which was incidentally noted by patient and right and suggested to [place] to look at and unfortunately she had a crack callous that revealed a small ulcer underneath. The good news is she has much better blood flow to the site. The bad news is obviously she  now has an additional wound. 10/20/17 on evaluation today patient presents following her angiogram with subsequent stent placement of the left, and iliac artery. Since her procedure her blood flow does seem to be better. 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 She had no significant outflow or runoff disease bilaterally. Obviously this is good news. This was Dr. Myra Gianotti who performed the procedure and that was on 10/10/17 she does have fault evaluation with them for repeat ABI's and arterial studies on 11/08/17 Patient History Information obtained from Patient. Social History Former smoker - quit 25 years ago, Marital Status - Single, Alcohol Use - Never, Drug Use - No History, Caffeine Use - Daily. Review of Systems (ROS) Constitutional Symptoms (General Health) The patient has no complaints or symptoms. Respiratory The patient has no complaints or symptoms. Cardiovascular Complains or has symptoms of LE edema. Psychiatric The patient has no complaints or symptoms. Objective Constitutional Well-nourished and well-hydrated in no acute distress. Vitals Time Taken: 12:47 PM, Height: 64 in, Weight: 262 lbs, BMI: 45, Temperature: 98.0 F, Respiratory Rate: 18 breaths/min. EMMELIA, HOLDSWORTH I. ([id number]) Respiratory normal breathing without difficulty. Psychiatric this patient is able to make decisions and demonstrates good insight into disease process. Alert and Oriented x 3. pleasant and cooperative. General Notes: YPatient's wounds especially in regard to the two plantar wounds at the first metatarsal region seem to be doing much better she did have some callous buildup around both of these as well is the lateral left foot wound. Unfortunately the lateral left foot wound also has additional undermining which the other two do not have. I did sharply debride all three wounds today which  he tolerated well without complication and only minimal discomfort. The two first metatarsal wound locations appear to be doing excellent post debridement and removal of callous. Subsequently the left lateral foot wound post debridement did reveal a bit more undermining I did remove much of the callous from surrounding but there did appear to still be undermining nonetheless. I still feel like this is better and definitely she has better blood flow. Integumentary (Hair, Skin) Wound #1 status is Open. Original cause of wound was Gradually Appeared. The wound is located on the Left,Plantar Metatarsal head first. The wound measures 0.2cm length x 0.4cm width x 0.3cm depth; 0.063cm^2 area and 0.019cm^3 volume. There is Fat Layer (Subcutaneous Tissue) Exposed exposed. There is no tunneling or undermining noted. There is a large amount of serosanguineous drainage noted. The wound margin is distinct with the  outline attached to the wound base. There is large (67-100%) red granulation within the wound bed. There is a small (1-33%) amount of necrotic tissue within the wound bed including Eschar and Adherent Slough. The periwound skin appearance exhibited: Callus. The periwound skin appearance did not exhibit: Crepitus, Excoriation, Induration, Rash, Scarring, Dry/Scaly, Maceration, Atrophie Blanche, Cyanosis, Ecchymosis, Hemosiderin Staining, Mottled, Pallor, Rubor, Erythema. Periwound temperature was noted as No Abnormality. The periwound has tenderness on palpation. Wound #2 status is Open. Original cause of wound was Trauma. The wound is located on the Left,Plantar Metatarsal head fifth. The wound measures 0.5cm length x 0.4cm width x 0.2cm depth; 0.157cm^2 area and 0.031cm^3 volume. There is Fat Layer (Subcutaneous Tissue) Exposed exposed. There is no tunneling or undermining noted. There is a large amount of serosanguineous drainage noted. The wound margin is distinct with the outline attached to the  wound base. There is large (67-100%) red, hyper - granulation within the wound bed. There is a small (1-33%) amount of necrotic tissue within the wound bed including Adherent Slough. The periwound skin appearance exhibited: Callus. The periwound skin appearance did not exhibit: Crepitus, Excoriation, Induration, Rash, Scarring, Dry/Scaly, Maceration, Atrophie Blanche, Cyanosis, Ecchymosis, Hemosiderin Staining, Mottled, Pallor, Rubor, Erythema. Periwound temperature was noted as No Abnormality. The periwound has tenderness on palpation. Wound #3 status is Open. Original cause of wound was Gradually Appeared. The wound is located on the Right,Plantar Metatarsal head first. The wound measures 0.4cm length x 0.3cm width x 0.1cm depth; 0.094cm^2 area and 0.009cm^3 volume. There is Fat Layer (Subcutaneous Tissue) Exposed exposed. There is no tunneling or undermining noted. There is a medium amount of serous drainage noted. The wound margin is flat and intact. There is large (67-100%) pink granulation within the wound bed. There is a small (1-33%) amount of necrotic tissue within the wound bed including Adherent Slough. The periwound skin appearance exhibited: Callus. The periwound skin appearance did not exhibit: Crepitus, Excoriation, Induration, Rash, Scarring, Dry/Scaly, Maceration, Atrophie Blanche, Cyanosis, Ecchymosis, Hemosiderin Staining, Mottled, Pallor, Rubor, Erythema. Periwound temperature was noted as No Abnormality. The periwound has tenderness on palpation. Assessment Active Problems ICD-10 L97.522 - Non-pressure chronic ulcer of other part of left foot with fat layer exposed L97.512 - Non-pressure chronic ulcer of other part of right foot with fat layer exposed I73.9 - Peripheral vascular disease, unspecified [name], Yvette I. 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 There was a Skin/Subcutaneous Tissue Debridement ([id number]) debridement with total area of 0.08 sq cm performed by STONE III, Akul Leggette E., PA-C. with the following instrument(s): Curette to remove Viable and Non-Viable tissue/material including Fibrin/Slough, Callus, and Subcutaneous after achieving pain control using Lidocaine 4% Topical Solution. A time out was conducted at 13:20, prior to the start of the procedure. A Minimum amount of bleeding was controlled with Pressure. The procedure was tolerated well with a pain level of 0 throughout and a pain level of 0 following the procedure. Post Debridement Measurements: 0.2cm length x 0.4cm width x 0.2cm depth; 0.013cm^3 volume. Character of Wound/Ulcer Post Debridement is improved. 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 There was a Skin/Subcutaneous Tissue Debridement ([id number]) debridement with total area of 0.2 sq cm performed by STONE III, Anna-Marie Coller  E., PA-C. with the following instrument(s): Curette to remove Viable and Non-Viable tissue/material including Fibrin/Slough, Callus, and Subcutaneous after achieving pain control using Lidocaine 4% Topical Solution. A time out was conducted at 13:24, prior to the start of the procedure. A Minimum amount of bleeding was controlled with Pressure. The procedure was tolerated well with a pain level of 0 throughout and a pain level of 0 following the procedure. Post Debridement Measurements: 0.5cm length x 0.4cm width x 0.3cm depth; 0.047cm^3 volume. Character of Wound/Ulcer Post Debridement is improved. Post procedure Diagnosis Wound #2: Same as Pre-Procedure Wound #3 Pre-procedure diagnosis of Wound #3 is a Neuropathic Ulcer-Non Diabetic located on the  Right,Plantar Metatarsal head first . There was a Skin/Subcutaneous Tissue Debridement ([id number]) debridement with total area of 0.12 sq cm performed by STONE III, Diani Jillson E., PA-C. with the following instrument(s): Curette to remove Viable and Non-Viable tissue/material including Fibrin/Slough, Callus, and Subcutaneous after achieving pain control using Lidocaine 4% Topical Solution. A time out was conducted at 13:17, prior to the start of the procedure. A Minimum amount of bleeding was controlled with Pressure. The procedure was tolerated well with a pain level of 0 throughout and a pain level of 0 following the procedure. Post Debridement Measurements: 0.4cm length x 0.3cm width x 0.2cm depth; 0.019cm^3 volume. Character of Wound/Ulcer Post Debridement is improved. Post procedure Diagnosis Wound #3: Same as Pre-Procedure Plan Wound Cleansing: Wound #1 Left,Plantar Metatarsal head first: Clean wound with Normal Saline. May Shower, gently pat wound dry prior to applying new dressing. Wound #2 Left,Plantar Metatarsal head fifth: Clean wound with Normal Saline. TATISHA, CERINO ([id number]) May Shower, gently pat wound dry prior to applying new dressing. Wound #3 Right,Plantar Metatarsal head first: Clean wound with Normal Saline. May Shower, gently pat wound dry prior to applying new dressing. Anesthetic (add to Medication List): Wound #1 Left,Plantar Metatarsal head first: Topical Lidocaine 4% cream applied to wound bed prior to debridement (In Clinic Only). Wound #2 Left,Plantar Metatarsal head fifth: Topical Lidocaine 4% cream applied to wound bed prior to debridement (In Clinic Only). Wound #3 Right,Plantar Metatarsal head first: Topical Lidocaine 4% cream applied to wound bed prior to debridement (In Clinic Only). Primary Wound Dressing: Wound #1 Left,Plantar Metatarsal head first: Other: - endoform Wound #2 Left,Plantar Metatarsal head fifth: Silvercel Non-Adherent Wound #3  Right,Plantar Metatarsal head first: Other: - endoform Secondary Dressing: Wound #1 Left,Plantar Metatarsal head first: Gauze and Kerlix/Conform Gauze and Kerlix/Conform Foam - for offloading Other - felt for offloading Wound #2 Left,Plantar Metatarsal head fifth: Gauze and Kerlix/Conform Foam - for offloading Other - felt for offloading Wound #3 Right,Plantar Metatarsal head first: Gauze and Kerlix/Conform Foam - for offloading Other - felt for offloading Dressing Change Frequency: Wound #1 Left,Plantar Metatarsal head first: Change dressing every other day. Wound #2 Left,Plantar Metatarsal head fifth: Change dressing every other day. Wound #3 Right,Plantar Metatarsal head first: Change dressing every other day. Follow-up Appointments: Wound #1 Left,Plantar Metatarsal head first: Return Appointment in 1 week. Wound #2 Left,Plantar Metatarsal head fifth: Return Appointment in 1 week. Wound #3 Right,Plantar Metatarsal head first: Return Appointment in 1 week. Edema Control: Wound #1 Left,Plantar Metatarsal head first: Elevate legs to the level of the heart and pump ankles as often as possible Wound #2 Left,Plantar Metatarsal head fifth: Elevate legs to the level of the heart and pump ankles as often as possible Wound #3 Right,Plantar Metatarsal head first: Elevate legs to the level of the heart and pump ankles as often  as possible Off-Loading: Wound #1 Left,Plantar Metatarsal head first: Open toe surgical shoe to: - left foot, felt Wound #2 Left,Plantar Metatarsal head fifth: Open toe surgical shoe to: - left foot, felt Wound #3 Right,Plantar Metatarsal head first: Open toe surgical shoe to: - left foot, felt [name], Yvette I. ([id number]) Additional Orders / Instructions: Wound #1 Left,Plantar Metatarsal head first: Increase protein intake. Wound #2 Left,Plantar Metatarsal head fifth: Increase protein intake. Wound #3 Right,Plantar Metatarsal head first: Increase  protein intake. I'm going to recommend that we at this point continue treatment for the left lateral wound with the silver alginate. Subsequently we will use Endoform for the other two wound site. Patient is in agreement with this plan. We will see were things stand in one weeks time. Please see above for specific wound care orders. We will see patient for re-evaluation in 1 week(s) here in the clinic. If anything worsens or changes patient will contact our office for additional recommendations. Electronic Signature(s) Signed: 10/21/2017 12:23:43 AM By: Lenda Kelp PA-C Entered By: Lenda Kelp on 10/20/2017 17:46:30 [name] ([id number]) -------------------------------------------------------------------------------- ROS/PFSH Details Patient Name: [name] Date of Service: 10/20/2017 12:30 PM Medical Record Number: [id number] Patient Account Number: [id number] Date of Birth/Sex: 04-Jul-1951 (67 y.o. Female) Treating RN: Curtis Sites Primary Care Provider: Jarome Matin Other Clinician: Referring Provider: Jarome Matin Treating Provider/Extender: Linwood Dibbles, Ziyon Cedotal Weeks in Treatment: 12 Information Obtained From Patient Wound History Do you currently have one or more open woundso Yes How many open wounds do you currently haveo 2 Approximately how long have you had your woundso 8 weeks How have you been treating your wound(s) until nowo hydrogel, Has your wound(s) ever healed and then re-openedo No Have you had any lab work done in the past montho Yes Who ordered the lab work doneo [facility] Have you tested positive for an antibiotic resistant organism (MRSA, VRE)o No Have you tested positive for osteomyelitis (bone infection)o No Have you had any tests for circulation on your legso Yes Who ordered the testo hospital Where was the test doneo [facility] Have you had other problems associated with your woundso Swelling Cardiovascular Complaints and  Symptoms: Positive for: LE edema Medical History: Positive for: Arrhythmia - a fib; Hypertension Constitutional Symptoms (General Health) Complaints and Symptoms: No Complaints or Symptoms Respiratory Complaints and Symptoms: No Complaints or Symptoms Neurologic Medical History: Positive for: Neuropathy Psychiatric Complaints and Symptoms: No Complaints or Symptoms Immunizations Pneumococcal Vaccine: Received Pneumococcal Vaccination: No [name], Yvette I. ([id number]) Implantable Devices Family and Social History Former smoker - quit 25 years ago; Marital Status - Single; Alcohol Use: Never; Drug Use: No History; Caffeine Use: Daily; Financial Concerns: No; Food, Clothing or Shelter Needs: No; Support System Lacking: No; Transportation Concerns: No; Advanced Directives: No; Patient does not want information on Advanced Directives Physician Affirmation I have reviewed and agree with the above information. Electronic Signature(s) Signed: 10/21/2017 12:23:43 AM By: Lenda Kelp PA-C Signed: 10/23/2017 5:01:42 PM By: Curtis Sites Entered By: Lenda Kelp on 10/20/2017 17:45:17 [name] ([id number]) -------------------------------------------------------------------------------- SuperBill Details Patient Name: [name] Date of Service: 10/20/2017 Medical Record Number: [id number] Patient Account Number: [id number] Date of Birth/Sex: 10/13/1950 (67 y.o. Female) Treating RN: Curtis Sites Primary Care Provider: Jarome Matin Other Clinician: Referring Provider: Jarome Matin Treating Provider/Extender: Linwood Dibbles, Leda [place] Weeks in Treatment: 12 Diagnosis Coding ICD-10 Codes Code Description [phone] Non-pressure chronic ulcer of other part of left foot with fat layer exposed L97.512 Non-pressure chronic ulcer  of other part of right foot with fat layer exposed I73.9 Peripheral vascular disease, unspecified G60.3 Idiopathic progressive  neuropathy E66.01 Morbid (severe) obesity due to excess calories Facility Procedures CPT4 Code: [id number] Description: 11042 - DEB SUBQ TISSUE 20 SQ CM/< ICD-10 Diagnosis Description L97.522 Non-pressure chronic ulcer of other part of left foot with fat L97.512 Non-pressure chronic ulcer of other part of right foot with fat Modifier: layer exposed layer exposed Quantity: 1 Physician Procedures CPT4 Code: [id number] Description: 11042 - WC PHYS SUBQ TISS 20 SQ CM ICD-10 Diagnosis Description L97.522 Non-pressure chronic ulcer of other part of left foot with fat L97.512 Non-pressure chronic ulcer of other part of right foot with fat Modifier: layer exposed layer exposed Quantity: 1 Electronic Signature(s) Signed: 10/21/2017 12:23:43 AM By: Lenda Kelp PA-C Entered By: Lenda Kelp on 10/20/2017 17:46:43

## 2017-10-27 ENCOUNTER — Encounter: Payer: PPO | Attending: Physician Assistant | Admitting: Physician Assistant

## 2017-10-27 DIAGNOSIS — L97522 Non-pressure chronic ulcer of other part of left foot with fat layer exposed: Secondary | ICD-10-CM | POA: Diagnosis not present

## 2017-10-27 DIAGNOSIS — I10 Essential (primary) hypertension: Secondary | ICD-10-CM | POA: Diagnosis not present

## 2017-10-27 DIAGNOSIS — I739 Peripheral vascular disease, unspecified: Secondary | ICD-10-CM | POA: Insufficient documentation

## 2017-10-27 DIAGNOSIS — Z6841 Body Mass Index (BMI) 40.0 and over, adult: Secondary | ICD-10-CM | POA: Insufficient documentation

## 2017-10-27 DIAGNOSIS — L97512 Non-pressure chronic ulcer of other part of right foot with fat layer exposed: Secondary | ICD-10-CM | POA: Insufficient documentation

## 2017-10-27 DIAGNOSIS — G603 Idiopathic progressive neuropathy: Secondary | ICD-10-CM | POA: Diagnosis not present

## 2017-10-27 DIAGNOSIS — F319 Bipolar disorder, unspecified: Secondary | ICD-10-CM | POA: Diagnosis not present

## 2017-10-27 DIAGNOSIS — Z87891 Personal history of nicotine dependence: Secondary | ICD-10-CM | POA: Diagnosis not present

## 2017-10-27 DIAGNOSIS — I4891 Unspecified atrial fibrillation: Secondary | ICD-10-CM | POA: Diagnosis not present

## 2017-10-29 NOTE — Progress Notes (Signed)
KATHEEN, ASLIN ([id number]) Visit Report for 10/27/2017 Chief Complaint Document Details Patient Name: STACYE, NOORI Date of Service: 10/27/2017 11:00 AM Medical Record Number: [id number] Patient Account Number: [id number] Date of Birth/Sex: 04/30/1951 (67 y.o. Female) Treating RN: Renne Crigler Primary Care Provider: Jarome Matin Other Clinician: Referring Provider: Jarome Matin Treating Provider/Extender: Linwood Dibbles, HOYT Weeks in Treatment: 13 Information Obtained from: Patient Chief Complaint Patient presents to the wound care center for a consult due non healing wound to the left plantar foot which she's had on and off for a number of years Electronic Signature(s) Signed: 10/27/2017 12:52:48 PM By: Lenda Kelp PA-C Entered By: Lenda Kelp on 10/27/2017 11:25:33 [name] ([id number]) -------------------------------------------------------------------------------- Debridement Details Patient Name: [name] Date of Service: 10/27/2017 11:00 AM Medical Record Number: [id number] Patient Account Number: [id number] Date of Birth/Sex: 21-Aug-1951 (67 y.o. Female) Treating RN: Renne Crigler Primary Care Provider: Jarome Matin Other Clinician: Referring Provider: Jarome Matin Treating Provider/Extender: Linwood Dibbles, HOYT Weeks in Treatment: 13 Debridement Performed for Wound #3 Right,Plantar Metatarsal head first Assessment: Performed By: Physician STONE III, HOYT E., PA-C Debridement: Debridement Pre-procedure Verification/Time Yes - 11:38 Out Taken: Start Time: 11:38 Pain Control: Other : lidocaine 4% Level: Skin/Subcutaneous Tissue Total Area Debrided (L x W): 0.4 (cm) x 0.5 (cm) = 0.2 (cm) Tissue and other material Viable, Non-Viable, Eschar, Fibrin/Slough, Skin, Subcutaneous debrided: Instrument: Curette Bleeding: Minimum Hemostasis Achieved: Pressure End Time: 11:39 Procedural Pain: 0 Post Procedural Pain: 0 Response  to Treatment: Procedure was tolerated well Post Debridement Measurements of Total Wound Length: (cm) 0.4 Width: (cm) 0.5 Depth: (cm) 0.3 Volume: (cm) 0.047 Character of Wound/Ulcer Post Debridement: Stable Post Procedure Diagnosis Same as Pre-procedure Electronic Signature(s) Signed: 10/27/2017 12:52:48 PM By: Lenda Kelp PA-C Signed: 10/27/2017 5:07:18 PM By: Renne Crigler Entered By: Renne Crigler on 10/27/2017 11:39:30 [name] ([id number]) -------------------------------------------------------------------------------- Debridement Details Patient Name: [name] Date of Service: 10/27/2017 11:00 AM Medical Record Number: [id number] Patient Account Number: [id number] Date of Birth/Sex: [birth year] (67 y.o. Female) Treating RN: Renne Crigler Primary Care Provider: Jarome Matin Other Clinician: Referring Provider: Jarome Matin Treating Provider/Extender: Linwood Dibbles, HOYT Weeks in Treatment: 13 Debridement Performed for Wound #1 Left,Plantar Metatarsal head first Assessment: Performed By: Physician STONE III, HOYT E., PA-C Debridement: Debridement Pre-procedure Verification/Time Yes - 11:38 Out Taken: Start Time: 11:38 Pain Control: Other : lidocaine 4% Level: Skin/Subcutaneous Tissue Total Area Debrided (L x W): 0.3 (cm) x 5 (cm) = 1.5 (cm) Tissue and other material Viable, Non-Viable, Eschar, Fibrin/Slough, Skin, Subcutaneous debrided: Instrument: Curette Bleeding: Minimum Hemostasis Achieved: Pressure End Time: 11:39 Procedural Pain: 0 Post Procedural Pain: 0 Response to Treatment: Procedure was tolerated well Post Debridement Measurements of Total Wound Length: (cm) 0.3 Width: (cm) 5 Depth: (cm) 0.4 Volume: (cm) 0.471 Character of Wound/Ulcer Post Debridement: Stable Post Procedure Diagnosis Same as Pre-procedure Electronic Signature(s) Signed: 10/27/2017 12:52:48 PM By: Lenda Kelp PA-C Signed: 10/27/2017 5:07:18 PM  By: Renne Crigler Entered By: Renne Crigler on 10/27/2017 11:40:57 [name] ([id number]) -------------------------------------------------------------------------------- Debridement Details Patient Name: [name] Date of Service: 10/27/2017 11:00 AM Medical Record Number: [id number] Patient Account Number: [id number] Date of Birth/Sex: 01/26/1951 (67 y.o. Female) Treating RN: Renne Crigler Primary Care Provider: Jarome Matin Other Clinician: Referring Provider: Jarome Matin Treating Provider/Extender: Linwood Dibbles, HOYT Weeks in Treatment: 13 Debridement Performed for Wound #2 Left,Plantar Metatarsal head fifth Assessment: Performed By: Physician STONE III, HOYT E., PA-C Debridement: Debridement Pre-procedure Verification/Time Yes -  11:38 Out Taken: Start Time: 11:38 Pain Control: Other : lidocaine 4% Level: Skin/Subcutaneous Tissue Total Area Debrided (L x W): 0.6 (cm) x 0.6 (cm) = 0.36 (cm) Tissue and other material Viable, Non-Viable, Eschar, Fibrin/Slough, Skin, Subcutaneous debrided: Instrument: Curette Bleeding: Minimum Hemostasis Achieved: Pressure End Time: 11:42 Procedural Pain: 0 Post Procedural Pain: 0 Response to Treatment: Procedure was tolerated well Post Debridement Measurements of Total Wound Length: (cm) 0.6 Width: (cm) 0.6 Depth: (cm) 0.4 Volume: (cm) 0.113 Character of Wound/Ulcer Post Debridement: Stable Post Procedure Diagnosis Same as Pre-procedure Electronic Signature(s) Signed: 10/27/2017 12:52:48 PM By: Lenda Kelp PA-C Signed: 10/27/2017 5:07:18 PM By: Renne Crigler Entered By: Renne Crigler on 10/27/2017 11:49:30 [name] ([id number]) -------------------------------------------------------------------------------- HPI Details Patient Name: [name] Date of Service: 10/27/2017 11:00 AM Medical Record Number: [id number] Patient Account Number: [id number] Date of Birth/Sex:  06-Aug-1951 (67 y.o. Female) Treating RN: Renne Crigler Primary Care Provider: Jarome Matin Other Clinician: Referring Provider: Jarome Matin Treating Provider/Extender: Linwood Dibbles, HOYT Weeks in Treatment: 13 History of Present Illness Location: if plantar foot in the region of the first metatarsal head and the fifth metatarsal head Quality: Patient reports No Pain. Severity: Patient states wound are getting worse. Duration: Patient has had the wound for > 3 months prior to seeking treatment at the wound center Context: The wound would happen gradually Modifying Factors: Other treatment(s) tried include:is been under the care of [facility] orthopedics Associated Signs and Symptoms: Patient reports having increase swelling. HPI Description: 67 year old patient referred to was from [facility] orthopedics where she was seen by Dr. Laverta Baltimore team saw her for a left foot ulcer which was there for about 4 months. She was seen in the emergency room on 06/05/2017 and they did a MRI which ruled out osteomyelitis and they placed her on antibiotics, which was Keflex for 3 weeks. She was found to have plantar ulcers in the region of the great toe and on the fifth metatarsal head. 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 on examination the patient was found to have 2 ulcers on the plantar aspect of the left forefoot the one on the fifth metatarsal head was 2-1/2 cm in diameter and down to the muscle. There was a lot of hypertrophic granulation tissue. It did not probe down to bone. Note the patient was seen in July of this year by Dr. 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 She was recommended a hallux MP  joint arthrodesis to correct the bunion and anti-tic conditions and she would weight-bear in a cam boot postoperatively. Her most recent MRI done on September 12 -- IMPRESSION: Skin wound at the fifth MTP joint without underlying abscess, septic joint or osteomyelitis.Hallux valgus and first MTP osteoarthritis. Midfoot degenerative change also noted. 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 Right toe brachial index of 0.52 and left of 0.4 suggestive of abnormal arterial flow at rest. left flow was monophasic on the dorsalis pedis and biphasic on the right side. 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 08/10/2017 -- she has upcoming appointments to see Dr. Victorino Dike and also to see the vascular surgeons at [facility]. Other than that she is doing well and being compliant with a dressing. 08/14/2017 -- she saw Dr. Victorino Dike earlier this morning and his note is pending. She has not yet had her appointment at the vascular office as yet. 08/21/2017 -- we have not yet received the consultation note from Dr. Victorino Dike and how vascular appointment is later this week. 08/28/17 on evaluation today patient appears to be doing fairly well in regard to the ulcers on her left lower extremity. She did have her arterial study with the following results. According to left arterial duplex study patient's left lower extremity arterial system is patent without evidence of hemodynamically significant stenosis. Peak systolic velocity of the bed superficial femoral artery is suggestive of a 30-49% stenosis with wall irregularity however the ratio is less than 1.5 Fortunately, she tells me that she does have  an appointment scheduled for January 8 Lauren arteriogram as well to further evaluate this. No fevers, chills, nausea, or vomiting noted at this time. Overall she is pleased with how things have been progressing. 09/07/2017 -- -- was seen by Dr. Durene Cal, who reviewed her vascular lab studies which showed ABI of 0.73 on the left and 1.02 on the right and monophasic waveforms throughout the left leg with no significant stenosis. Toe pressure was 50 on Ringgenberg, Merisa I. ([id number]) the left. He has recommended proceeding with angiography and this has been scheduled for early January as per the patientos request. 10/05/17 on evaluation today patient's wound appears to be doing fairly well at this point. She has been recommended to go forward with an angiogram and that had been rescheduled but at this point in time is actually scheduled for the 15th of this month which is next week. Unfortunately this had to be pushed back. This was due to her blood thinner medication which she thought she needed to stop a different day. Nonetheless her wound does not appear to be too bad in regard to the [facility] surface of the foot is just not showing good signs of improving. Basically she is maintaining. Unfortunately she did have a new area on the right plantar surface of her foot which was incidentally noted by patient and right and suggested to [place] to look at and unfortunately she had a crack callous that revealed a small ulcer underneath. The good news is she has much better blood flow to the site. The bad news is obviously she now has an additional wound. 10/20/17 on evaluation today patient presents following her angiogram with subsequent stent placement of the left, and iliac artery. Since her procedure her blood flow does seem to be better. 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 She had no significant  outflow or runoff disease bilaterally. Obviously this is good news. This was Dr. Myra Gianotti who performed the procedure and that was on 10/10/17 she does have follow-up evaluation with them for repeat ABI's and arterial studies on 11/08/17 10/27/17 on evaluation today patient appears to be doing fairly well in regard to her bilateral foot ulcers. She is definitely having some improvement in regard to all three and my opinion. With that being said this is obviously somewhat slow I do think the revascularization has made a big difference for her.  Nonetheless I did have a brief discussion with her today about a total contact cast. I think this would likely be the best thing to help her improve more rapidly in regard to appropriate offloading. She is somewhat willing to give this a try she believes that she could wear this without any balance issues she had worn a walking boot previously. Electronic Signature(s) Signed: 10/27/2017 12:52:48 PM By: Lenda Kelp PA-C Entered By: Lenda Kelp on 10/27/2017 12:01:45 [name] ([id number]) -------------------------------------------------------------------------------- Physical Exam Details Patient Name: [name] Date of Service: 10/27/2017 11:00 AM Medical Record Number: [id number] Patient Account Number: [id number] Date of Birth/Sex: [birth year] (67 y.o. Female) Treating RN: Renne Crigler Primary Care Provider: Jarome Matin Other Clinician: Referring Provider: Jarome Matin Treating Provider/Extender: STONE III, HOYT Weeks in Treatment: 13 Constitutional Obese and well-hydrated in no acute distress. Respiratory normal breathing without difficulty. Psychiatric this patient is able to make decisions and demonstrates good insight into disease process. Alert and Oriented x 3. pleasant and cooperative. Notes Patient's wound did show evidence of slough covering at this point today. There was also callous surrounding each and  this was sharply debride it with good result. Post debridement all three wounds appear to be doing better and I was able to get a lot of the slough and necrotic tissue out of the left lateral foot wound especially. This will go a long way to see in this heal. Electronic Signature(s) Signed: 10/27/2017 12:52:48 PM By: Lenda Kelp PA-C Entered By: Lenda Kelp on 10/27/2017 12:02:50 [name] ([id number]) -------------------------------------------------------------------------------- Physician Orders Details Patient Name: [name] Date of Service: 10/27/2017 11:00 AM Medical Record Number: [id number] Patient Account Number: [id number] Date of Birth/Sex: September 22, 1951 (67 y.o. 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 o May Shower, gently pat wound dry prior to applying new dressing. Wound #2 Left,Plantar Metatarsal head fifth o Clean wound with Normal Saline. o May Shower, gently pat wound dry prior to applying new dressing. Wound #3 Right,Plantar Metatarsal head first o Clean wound with Normal Saline. o May Shower, gently pat wound dry prior to applying new dressing. Anesthetic (add to Medication List) Wound #1 Left,Plantar Metatarsal head first o Topical Lidocaine 4% cream applied to wound bed prior to debridement (In  Clinic Only). Wound #2 Left,Plantar Metatarsal head fifth o Topical Lidocaine 4% cream applied to wound bed prior to debridement (In Clinic Only). Wound #3 Right,Plantar Metatarsal head first o Topical Lidocaine 4% cream applied to wound bed prior to debridement (In Clinic Only). Primary Wound Dressing Wound #1 Left,Plantar Metatarsal head first o Other: - endoform Wound #2 Left,Plantar Metatarsal head fifth o Silvercel Non-Adherent Wound #3 Right,Plantar Metatarsal head first o Other: - endoform Secondary Dressing Rinker, Rorie I. ([id number]) Wound #1 Left,Plantar Metatarsal head first o Gauze and Kerlix/Conform o Gauze and Kerlix/Conform o Foam - for offloading o Other - felt for offloading Wound #2 Left,Plantar Metatarsal head fifth o Gauze and Kerlix/Conform o Foam - for  offloading o Other - felt for offloading Wound #3 Right,Plantar Metatarsal head first o Gauze and Kerlix/Conform o Foam - for offloading o Other - felt for offloading Dressing Change Frequency Wound #1 Left,Plantar Metatarsal head first o Change dressing every other day. Wound #2 Left,Plantar Metatarsal head fifth o Change dressing every other day. Wound #3 Right,Plantar Metatarsal head first o Change dressing every other day. Follow-up Appointments Wound #1 Left,Plantar Metatarsal head first o Return Appointment in 1 week. Wound #2 Left,Plantar Metatarsal head fifth o Return Appointment in 1 week. Wound #3 Right,Plantar Metatarsal head first o Return Appointment in 1 week. Edema Control Wound #1 Left,Plantar Metatarsal head first o Elevate legs to the level of the heart and pump ankles as often as possible Wound #2 Left,Plantar Metatarsal head fifth o Elevate legs to the level of the heart and pump ankles as often as possible Wound #3 Right,Plantar Metatarsal head first o Elevate legs to the level of the heart and pump ankles as often as  possible Off-Loading Wound #1 Left,Plantar Metatarsal head first o Open toe surgical shoe to: - left foot, felt Wound #2 Left,Plantar Metatarsal head fifth o Open toe surgical shoe to: - left foot, felt Wound #3 Right,Plantar Metatarsal head first o Open toe surgical shoe to: - left foot, felt Neely, Darlin I. ([id number]) Additional Orders / Instructions Wound #1 Left,Plantar Metatarsal head first o Increase protein intake. Wound #2 Left,Plantar Metatarsal head fifth o Increase protein intake. Wound #3 Right,Plantar Metatarsal head first o Increase protein intake. Electronic Signature(s) Signed: 10/27/2017 12:52:48 PM By: Lenda Kelp PA-C Signed: 10/27/2017 5:07:18 PM By: Renne Crigler Entered By: Renne Crigler on 10/27/2017 11:48:10 [name] ([id number]) -------------------------------------------------------------------------------- Problem List Details Patient Name: [name] Date of Service: 10/27/2017 11:00 AM Medical Record Number: [id number] Patient Account Number: [id number] Date of Birth/Sex: [birth year] (67 y.o. 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 Female) Treating RN: Renne Crigler Primary Care Provider: Jarome Matin Other Clinician: Referring Provider: Jarome Matin Treating Provider/Extender: Linwood Dibbles, HOYT Weeks in Treatment: 13 Subjective Chief Complaint Information obtained from Patient Patient presents to the wound care center for a consult due non healing wound to the left plantar foot which she's had on and off for a number of years History of Present Illness (HPI) The following HPI elements were documented for the patient's wound: Location: if plantar foot in the region of the first metatarsal head and the fifth metatarsal head Quality: Patient reports No Pain. Severity: Patient states wound are getting worse. Duration: Patient has had the wound for > 3 months prior to seeking treatment at the wound center Context: The wound would happen gradually Modifying Factors: Other treatment(s) tried  include:is been under the care of [facility] orthopedics Associated Signs and Symptoms: Patient reports having increase swelling. 67 year old patient referred to was from [facility] orthopedics where she was seen by Dr. Laverta Baltimore team saw her for a left foot ulcer which was there for about 4 months. She was seen in the emergency room on 06/05/2017 and they did a MRI which ruled out osteomyelitis and they placed her on antibiotics, which was Keflex for 3 weeks. She was found to have plantar ulcers in the region of the great toe and on the fifth metatarsal head. 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 on examination the patient was found to have 2 ulcers on the plantar aspect of the left forefoot the one on the fifth metatarsal head was 2-1/2 cm in diameter and down to the muscle. There was a lot of hypertrophic granulation tissue. It did not probe down to bone. Note the patient was seen in July of this year by Dr. 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 She was recommended a hallux MP joint arthrodesis to correct the bunion and anti-tic conditions and she would weight-bear in a cam boot postoperatively. Her most recent MRI done on September 12 -- IMPRESSION: Skin wound at the fifth MTP joint without underlying abscess, septic joint or osteomyelitis.Hallux valgus and first MTP osteoarthritis. Midfoot degenerative change also noted. 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 Right toe brachial index of 0.52 and left of 0.4 suggestive of abnormal arterial flow at rest. left flow was monophasic on the dorsalis pedis and biphasic on the right side. 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 08/10/2017 -- she has upcoming appointments to see Dr. Victorino Dike and also to see the vascular surgeons at [facility]. Other than that she is doing well and being compliant with a dressing. 08/14/2017 -- she saw Dr. Victorino Dike earlier this morning and his note is pending. She has not yet had her appointment at the vascular office as yet. 08/21/2017 -- we have not yet received the consultation note from Dr. Victorino Dike and  how vascular appointment is later this week. 08/28/17 on evaluation today patient appears to be doing fairly well in regard to the ulcers on her left lower extremity. She did have her arterial study with the following results. According to left arterial duplex study patient's left lower extremity arterial system is patent without evidence of Wittler, Lalana I. ([id number]) hemodynamically significant stenosis. Peak systolic velocity of the bed superficial femoral artery is suggestive of a 30-49% stenosis with wall irregularity however the ratio is less than 1.5 Fortunately, she tells me that she does have an appointment scheduled for January 8 Lauren arteriogram as well to further evaluate this. No fevers, chills, nausea, or vomiting noted at this time. Overall she is pleased with how things have been progressing. 09/07/2017 -- -- was seen by Dr. Durene Cal, who reviewed her vascular lab studies which showed ABI of 0.73 on the left and 1.02 on the right  and monophasic waveforms throughout the left leg with no significant stenosis. Toe pressure was 50 on the left. He has recommended proceeding with angiography and this has been scheduled for early January as per the patient s request. 10/05/17 on evaluation today patient's wound appears to be doing fairly well at this point. She has been recommended to go forward with an angiogram and that had been rescheduled but at this point in time is actually scheduled for the 15th of this month which is next week. Unfortunately this had to be pushed back. This was due to her blood thinner medication which she thought she needed to stop a different day. Nonetheless her wound does not appear to be too bad in regard to the [facility] surface of the foot is just not showing good signs of improving. Basically she is maintaining. Unfortunately she did have a new area on the right plantar surface of her foot which was incidentally noted by patient and right and  suggested to [place] to look at and unfortunately she had a crack callous that revealed a small ulcer underneath. The good news is she has much better blood flow to the site. The bad news is obviously she now has an additional wound. 10/20/17 on evaluation today patient presents following her angiogram with subsequent stent placement of the left, and iliac artery. Since her procedure her blood flow does seem to be better. 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 She had no significant outflow or runoff disease bilaterally. Obviously this is good news. This was Dr. Myra Gianotti who performed the procedure and that was on 10/10/17 she does have follow-up evaluation with them for repeat ABI's and arterial studies on 11/08/17 10/27/17 on evaluation today patient appears to be doing fairly well in regard to her bilateral foot ulcers. She is definitely having some improvement in regard to all three and my opinion. With that being said this is obviously somewhat slow I do think the revascularization has made a big difference for her. Nonetheless I did have a brief discussion with her today about a total contact cast. I think this would likely be the best thing to help her improve more rapidly in regard to appropriate offloading. She is somewhat willing to give this a try she believes that she could wear this without any balance issues she had worn a walking boot previously. Patient History Information obtained from Patient. Social History Former smoker - quit 25 years ago, Marital Status - Single, Alcohol Use - Never, Drug Use - No History, Caffeine Use - Daily. Review of Systems (ROS) Constitutional Symptoms (General Health) Denies complaints or symptoms of Fever, Chills. Respiratory The patient has no complaints or symptoms. Cardiovascular The patient has no complaints or symptoms. Objective Perkey, Mendi  I. ([id number]) Constitutional Obese and well-hydrated in no acute distress. Vitals Time Taken: 11:13 AM, Height: 64 in, Weight: 262 lbs, BMI: 45, Temperature: 97.9 F, Pulse: 50 bpm, Respiratory Rate: 18 breaths/min, Blood Pressure: 115/51 mmHg. Respiratory normal breathing without difficulty. Psychiatric this patient is able to make decisions and demonstrates good insight into disease process. Alert and Oriented x 3. pleasant and cooperative. General Notes: Patient's wound did show evidence of slough covering at this point today. There was also callous surrounding each and this was sharply debride it with good result. Post debridement all three wounds appear to be doing better and I was able to get a  lot of the slough and necrotic tissue out of the left lateral foot wound especially. This will go a long way to see in this heal. Integumentary (Hair, Skin) Wound #1 status is Open. Original cause of wound was Gradually Appeared. The wound is located on the Left,Plantar Metatarsal head first. The wound measures 0.3cm length x 5cm width x 0.4cm depth; 1.178cm^2 area and 0.471cm^3 volume. There is Fat Layer (Subcutaneous Tissue) Exposed exposed. There is no tunneling or undermining noted. There is a large amount of serosanguineous drainage noted. The wound margin is distinct with the outline attached to the wound base. There is large (67-100%) red granulation within the wound bed. There is a small (1-33%) amount of necrotic tissue within the wound bed including Eschar and Adherent Slough. The periwound skin appearance exhibited: Callus. The periwound skin appearance did not exhibit: Crepitus, Excoriation, Induration, Rash, Scarring, Dry/Scaly, Maceration, Atrophie Blanche, Cyanosis, Ecchymosis, Hemosiderin Staining, Mottled, Pallor, Rubor, Erythema. Periwound temperature was noted as No Abnormality. The periwound has tenderness on palpation. Wound #2 status is Open. Original cause of wound was  Trauma. The wound is located on the Left,Plantar Metatarsal head fifth. The wound measures 0.2cm length x 0.2cm width x 0.3cm depth; 0.031cm^2 area and 0.009cm^3 volume. There is Fat Layer (Subcutaneous Tissue) Exposed exposed. There is no tunneling or undermining noted. There is a large amount of serosanguineous drainage noted. The wound margin is distinct with the outline attached to the wound base. There is medium (34-66%) red, hyper - granulation within the wound bed. There is a medium (34-66%) amount of necrotic tissue within the wound bed including Adherent Slough. The periwound skin appearance exhibited: Callus. The periwound skin appearance did not exhibit: Crepitus, Excoriation, Induration, Rash, Scarring, Dry/Scaly, Maceration, Atrophie Blanche, Cyanosis, Ecchymosis, Hemosiderin Staining, Mottled, Pallor, Rubor, Erythema. Periwound temperature was noted as No Abnormality. The periwound has tenderness on palpation. Wound #3 status is Open. Original cause of wound was Gradually Appeared. The wound is located on the Right,Plantar Metatarsal head first. The wound measures 0.4cm length x 0.5cm width x 0.2cm depth; 0.157cm^2 area and 0.031cm^3 volume. There is Fat Layer (Subcutaneous Tissue) Exposed exposed. There is no tunneling or undermining noted. There is a medium amount of serous drainage noted. The wound margin is flat and intact. There is large (67-100%) pink granulation within the wound bed. There is a small (1-33%) amount of necrotic tissue within the wound bed including Adherent Slough. The periwound skin appearance exhibited: Callus. The periwound skin appearance did not exhibit: Crepitus, Excoriation, Induration, Rash, Scarring, Dry/Scaly, Maceration, Atrophie Blanche, Cyanosis, Ecchymosis, Hemosiderin Staining, Mottled, Pallor, Rubor, Erythema. Periwound temperature was noted as No Abnormality. The periwound has tenderness on palpation. Assessment Active Problems ICD-10 Cato MulliganSUTPHEN,  Iretha I. 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 There was a Skin/Subcutaneous Tissue Debridement ([id number]) debridement with total area of 1.5 sq cm performed by STONE III, HOYT E., PA-C. with the following instrument(s): Curette to remove Viable and Non-Viable tissue/material including Fibrin/Slough, Eschar, Skin, and Subcutaneous after achieving pain control using Other (lidocaine 4%). A time out was conducted at 11:38, prior to the start of the procedure.  A Minimum amount of bleeding was controlled with Pressure. The procedure was tolerated well with a pain level of 0 throughout and a pain level of 0 following the procedure. Post Debridement Measurements: 0.3cm length x 5cm width x 0.4cm depth; 0.471cm^3 volume. Character of Wound/Ulcer Post Debridement is stable. 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 There was a Skin/Subcutaneous Tissue Debridement ([id number]) debridement with total area of 0.36 sq cm performed by STONE III, HOYT E., PA-C. with the following instrument(s): Curette to remove Viable and Non-Viable tissue/material including Fibrin/Slough, Eschar, Skin, and Subcutaneous after achieving pain control using Other (lidocaine 4%). A time out was conducted at 11:38, prior to the start of the procedure. A Minimum amount of bleeding was controlled with Pressure. The procedure was tolerated well  with a pain level of 0 throughout and a pain level of 0 following the procedure. Post Debridement Measurements: 0.6cm length x 0.6cm width x 0.4cm depth; 0.113cm^3 volume. Character of Wound/Ulcer Post Debridement is stable. Post procedure Diagnosis Wound #2: Same as Pre-Procedure Wound #3 Pre-procedure diagnosis of Wound #3 is a Neuropathic Ulcer-Non Diabetic located on the Right,Plantar Metatarsal head first . There was a Skin/Subcutaneous Tissue Debridement ([id number]) debridement with total area of 0.2 sq cm performed by STONE III, HOYT E., PA-C. with the following instrument(s): Curette to remove Viable and Non-Viable tissue/material including Fibrin/Slough, Eschar, Skin, and Subcutaneous after achieving pain control using Other (lidocaine 4%). A time out was conducted at 11:38, prior to the start of the procedure. A Minimum amount of bleeding was controlled with Pressure. The procedure was tolerated well with a pain level of 0 throughout and a pain level of 0 following the procedure. Post Debridement Measurements: 0.4cm length x 0.5cm width x 0.3cm depth; 0.047cm^3 volume. Character of Wound/Ulcer Post Debridement is stable. Post procedure Diagnosis Wound #3: Same as Pre-Procedure Plan Wound Cleansing: Wound #1 Left,Plantar Metatarsal head first: Clean wound with Normal Saline. LISETT, DIRUSSO ([id number]) May Shower, gently pat wound dry prior to applying new dressing. Wound #2 Left,Plantar Metatarsal head fifth: Clean wound with Normal Saline. May Shower, gently pat wound dry prior to applying new dressing. Wound #3 Right,Plantar Metatarsal head first: Clean wound with Normal Saline. May Shower, gently pat wound dry prior to applying new dressing. Anesthetic (add to Medication List): Wound #1 Left,Plantar Metatarsal head first: Topical Lidocaine 4% cream applied to wound bed prior to debridement (In Clinic Only). Wound #2 Left,Plantar Metatarsal head fifth: Topical  Lidocaine 4% cream applied to wound bed prior to debridement (In Clinic Only). Wound #3 Right,Plantar Metatarsal head first: Topical Lidocaine 4% cream applied to wound bed prior to debridement (In Clinic Only). Primary Wound Dressing: Wound #1 Left,Plantar Metatarsal head first: Other: - endoform Wound #2 Left,Plantar Metatarsal head fifth: Silvercel Non-Adherent Wound #3 Right,Plantar Metatarsal head first: Other: - endoform Secondary Dressing: Wound #1 Left,Plantar Metatarsal head first: Gauze and Kerlix/Conform Gauze and Kerlix/Conform Foam - for offloading Other - felt for offloading Wound #2 Left,Plantar Metatarsal head fifth: Gauze and Kerlix/Conform Foam - for offloading Other - felt for offloading Wound #3 Right,Plantar Metatarsal head first: Gauze and Kerlix/Conform Foam - for offloading Other - felt for offloading Dressing Change Frequency: Wound #1 Left,Plantar Metatarsal head first: Change dressing every other day. Wound #2 Left,Plantar Metatarsal head fifth: Change dressing every other day. Wound #3 Right,Plantar Metatarsal head first: Change dressing every other day.  Follow-up Appointments: Wound #1 Left,Plantar Metatarsal head first: Return Appointment in 1 week. Wound #2 Left,Plantar Metatarsal head fifth: Return Appointment in 1 week. Wound #3 Right,Plantar Metatarsal head first: Return Appointment in 1 week. Edema Control: Wound #1 Left,Plantar Metatarsal head first: Elevate legs to the level of the heart and pump ankles as often as possible Wound #2 Left,Plantar Metatarsal head fifth: Elevate legs to the level of the heart and pump ankles as often as possible Wound #3 Right,Plantar Metatarsal head first: Elevate legs to the level of the heart and pump ankles as often as possible Off-Loading: Wound #1 Left,Plantar Metatarsal head first: Open toe surgical shoe to: - left foot, felt Wound #2 Left,Plantar Metatarsal head fifth: KARESS, HARNER I.  ([id number]) Open toe surgical shoe to: - left foot, felt Wound #3 Right,Plantar Metatarsal head first: Open toe surgical shoe to: - left foot, felt Additional Orders / Instructions: Wound #1 Left,Plantar Metatarsal head first: Increase protein intake. Wound #2 Left,Plantar Metatarsal head fifth: Increase protein intake. Wound #3 Right,Plantar Metatarsal head first: Increase protein intake. We will continue with the current wound care orders for the next week. Will see were things stand following. If anything worsens significantly in the interim patient will contact our office for additional recommendations. Please see above for specific wound care orders. We will see patient for re-evaluation in 1 week(s) here in the clinic. If anything worsens or changes patient will contact our office for additional recommendations. Electronic Signature(s) Signed: 10/27/2017 12:52:48 PM By: Lenda Kelp PA-C Entered By: Lenda Kelp on 10/27/2017 12:03:17 [name] ([id number]) -------------------------------------------------------------------------------- ROS/PFSH Details Patient Name: [name] Date of Service: 10/27/2017 11:00 AM Medical Record Number: [id number] Patient Account Number: [id number] Date of Birth/Sex: 21-Mar-1951 (67 y.o. Female) Treating RN: Renne Crigler Primary Care Provider: Jarome Matin Other Clinician: Referring Provider: Jarome Matin Treating Provider/Extender: Linwood Dibbles, HOYT Weeks in Treatment: 13 Information Obtained From Patient Wound History Do you currently have one or more open woundso Yes How many open wounds do you currently haveo 2 Approximately how long have you had your woundso 8 weeks How have you been treating your wound(s) until nowo hydrogel, Has your wound(s) ever healed and then re-openedo No Have you had any lab work done in the past montho Yes Who ordered the lab work doneo [facility] Have you tested positive for an  antibiotic resistant organism (MRSA, VRE)o No Have you tested positive for osteomyelitis (bone infection)o No Have you had any tests for circulation on your legso Yes Who ordered the testo hospital [facility] was the test done[facility] Have you had other problems associated with your woundso Swelling Constitutional Symptoms (General Health) Complaints and Symptoms: Negative for: Fever; Chills Respiratory Complaints and Symptoms: No Complaints or Symptoms Cardiovascular Complaints and Symptoms: No Complaints or Symptoms Medical History: Positive for: Arrhythmia - a fib; Hypertension Neurologic Medical History: Positive for: Neuropathy Immunizations Pneumococcal Vaccine: Received Pneumococcal Vaccination: No Implantable Devices Family and Social History CLEMENCE, LENGYEL ([id number]) Former smoker - quit 25 years ago; Marital Status - Single; Alcohol Use: Never; Drug Use: No History; Caffeine Use: Daily; Financial Concerns: No; Food, Clothing or Shelter Needs: No; Support System Lacking: No; Transportation Concerns: No; Advanced Directives: No; Patient does not want information on Advanced Directives Physician Affirmation I have reviewed and agree with the above information. Electronic Signature(s) Signed: 10/27/2017 12:52:48 PM By: Lenda Kelp PA-C Signed: 10/27/2017 5:07:18 PM By: Renne Crigler Entered By: Lenda Kelp on 10/27/2017 12:02:09 Montavon, Kileen I. (  [id number]) -------------------------------------------------------------------------------- SuperBill Details Patient Name: [name] Date of Service: 10/27/2017 Medical Record Number: [id number] Patient Account Number: [id number] Date of Birth/Sex: 02-15-1951 (67 y.o. 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 E66.01 Morbid (severe) obesity due to excess calories Facility Procedures CPT4 Code: [id number] Description: 11042 - DEB SUBQ TISSUE 20 SQ CM/< ICD-10 Diagnosis Description L97.512 Non-pressure chronic ulcer of other part of right foot with fat Modifier: layer exposed Quantity: 1 Physician Procedures CPT4 Code: [id number] Description: 11042 - WC PHYS SUBQ TISS 20 SQ CM ICD-10 Diagnosis Description L97.512 Non-pressure chronic ulcer of other part of right foot with fat Modifier: layer exposed Quantity: 1 Electronic Signature(s) Signed: 10/27/2017 12:52:48 PM By: Lenda Kelp PA-C Entered By: Lenda Kelp on 10/27/2017 12:03:56

## 2017-10-29 NOTE — Progress Notes (Addendum)
HANI, PATNODE ([id number]) Visit Report for 10/27/2017 Arrival Information Details Patient Name: [name], [name] Date of Service: 10/27/2017 11:00 AM Medical Record Number: [id number] Patient Account Number: [id number] Date of Birth/Sex: 12/03/1950 (66 y.o. Female) Treating RN: Renne Crigler Primary Care Shiana Rappleye: Jarome Matin Other Clinician: Referring Tyray Proch: Jarome Matin Treating Jex Strausbaugh/Extender: Linwood Dibbles, HOYT Weeks in Treatment: 13 Visit Information History Since Last Visit All ordered tests and consults were completed: No Patient Arrived: Cane Added or deleted any medications: No Arrival Time: 11:12 Any new allergies or adverse reactions: No Accompanied By: self Had a fall or experienced change in No Transfer Assistance: None activities of daily living that may affect Patient Has Alerts: Yes risk of falls: Patient Alerts: Patient on Blood Thinner Signs or symptoms of abuse/neglect since last visito No Eliquis Hospitalized since last visit: No R ABI 1.02 Greens VVS Pain Present Now: Yes Electronic Signature(s) Signed: 11/06/2017 2:57:22 PM By: Alejandro Mulling Previous Signature: 10/27/2017 5:07:18 PM Version By: Renne Crigler Entered By: Alejandro Mulling on 11/06/2017 14:57:22 [name] ([id number]) -------------------------------------------------------------------------------- Encounter Discharge Information Details Patient Name: [name] Date of Service: 10/27/2017 11:00 AM Medical Record Number: [id number] Patient Account Number: [id number] Date of Birth/Sex: 1951/05/25 (66 y.o. Female) Treating RN: Renne Crigler Primary Care Yochanan Eddleman: Jarome Matin Other Clinician: Referring Krystol Rocco: Jarome Matin Treating Shaquia Berkley/Extender: Linwood Dibbles, HOYT Weeks in Treatment: 13 Encounter Discharge Information Items Schedule Follow-up Appointment: No Medication Reconciliation completed and No provided to Patient/Care  Oretha Weismann: Provided on Clinical Summary of Care: 10/27/2017 Form Type Recipient Paper Patient CS Electronic Signature(s) Signed: 10/27/2017 4:27:15 PM By: Gwenlyn Perking Entered By: Gwenlyn Perking on 10/27/2017 12:03:05 [name] ([id number]) -------------------------------------------------------------------------------- Lower Extremity Assessment Details Patient Name: [name] Date of Service: 10/27/2017 11:00 AM Medical Record Number: [id number] Patient Account Number: [id number] Date of Birth/Sex: August 31, 1951 (66 y.o. Female) Treating RN: Renne Crigler Primary Care Carolan Avedisian: Jarome Matin Other Clinician: Referring Mikail Goostree: Jarome Matin Treating Venba Zenner/Extender: Linwood Dibbles, HOYT Weeks in Treatment: 13 Edema Assessment Assessed: [Left: No] [Right: No] Edema: [Left: Yes] [Right: No] Vascular Assessment Claudication: Claudication Assessment [Left:None] [Right:None] Pulses: Dorsalis Pedis Palpable: [Left:Yes] [Right:Yes] Posterior Tibial Extremity colors, hair growth, and conditions: Extremity Color: [Left:Normal] [Right:Normal] Hair Growth on Extremity: [Left:No] [Right:No] Temperature of Extremity: [Left:Cool] [Right:Cool] Capillary Refill: [Left:< 3 seconds] [Right:< 3 seconds] Toe Nail Assessment Left: Right: Thick: No No Discolored: Yes Yes Deformed: No No Improper Length and Hygiene: Yes Yes Electronic Signature(s) Signed: 10/27/2017 5:07:18 PM By: Renne Crigler Entered By: Renne Crigler on 10/27/2017 11:30:05 [name] ([id number]) -------------------------------------------------------------------------------- Multi Wound Chart Details Patient Name: [name] Date of Service: 10/27/2017 11:00 AM Medical Record Number: [id number] Patient Account Number: [id number] Date of Birth/Sex: 1951/07/11 (66 y.o. Female) Treating RN: Renne Crigler Primary Care Kycen Spalla: Jarome Matin Other Clinician: Referring Murial Beam:  Jarome Matin Treating Cherril Hett/Extender: Linwood Dibbles, HOYT Weeks in Treatment: 13 Vital Signs Height(in): 64 Pulse(bpm): 50 Weight(lbs): 262 Blood Pressure(mmHg): 115/51 Body Mass Index(BMI): 45 Temperature(F): 97.9 Respiratory Rate 18 (breaths/min): Photos: [1:No Photos] [2:No Photos] [3:No Photos] Wound Location: [1:Left Metatarsal head first - Plantar] [2:Left Metatarsal head fifth - Plantar] [3:Right Metatarsal head first - Plantar] Wounding Event: [1:Gradually Appeared] [2:Trauma] [3:Gradually Appeared] 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] [2:Neuropathic Ulcer-Non Diabetic] [3:Neuropathic Ulcer-Non Diabetic] Comorbid History: [1:Arrhythmia, Hypertension, Neuropathy] [2:Arrhythmia, Hypertension, Neuropathy] [3:Arrhythmia, Hypertension, Neuropathy] Date Acquired: [1:01/22/2017] [2:05/27/2017] [3:10/05/2017] Weeks of Treatment: [1:13] [2:13] [3:3] Wound Status: [1:Open] [2:Open] [3:Open] Measurements L x W x D [1:0.3x5x0.4] [2:0.2x0.2x0.3] [3:0.4x0.5x0.2] (cm) Area (cm) : [  1:1.178] [2:0.031] [3:0.157] Volume (cm) : [1:0.471] [2:0.009] [3:0.031] % Reduction in Area: [1:-834.90%] [2:97.00%] [3:-149.20%] % Reduction in Volume: [1:-647.60%] [2:91.20%] [3:-138.50%] Classification: [1:Full Thickness With Exposed Support Structures] [2:Full Thickness With Exposed Support Structures] [3:Full Thickness With Exposed Support Structures] Exudate Amount: [1:Large] [2:Large] [3:Medium] Exudate Type: [1:Serosanguineous] [2:Serosanguineous] [3:Serous] Exudate Color: [1:red, brown] [2:red, brown] [3:amber] Wound Margin: [1:Distinct, outline attached] [2:Distinct, outline attached] [3:Flat and Intact] Granulation Amount: [1:Large (67-100%)] [2:Medium (34-66%)] [3:Large (67-100%)] Granulation Quality: [1:Red] [2:Red, Hyper-granulation] [3:Pink] Necrotic Amount: [1:Small (1-33%)] [2:Medium (34-66%)] [3:Small (1-33%)] Necrotic Tissue: [1:Eschar, Adherent Slough] [2:Adherent  Slough] [3:Adherent Slough] Exposed Structures: [1:Fat Layer (Subcutaneous Tissue) Exposed: Yes Fascia: No Tendon: No Muscle: No Joint: No Bone: No] [2:Fat Layer (Subcutaneous Tissue) Exposed: Yes Fascia: No Tendon: No Muscle: No Joint: No Bone: No] [3:Fat Layer (Subcutaneous Tissue) Exposed: Yes  Fascia: No Tendon: No Muscle: No Joint: No Bone: No] Epithelialization: [1:None] [2:None] [3:None] Periwound Skin Texture: [1:Callus: Yes Excoriation: No] [2:Callus: Yes Excoriation: No] [3:Callus: Yes Excoriation: No] Induration: No Induration: No Induration: No Crepitus: No Crepitus: No Crepitus: No Rash: No Rash: No Rash: No Scarring: No Scarring: No Scarring: No Periwound Skin Moisture: Maceration: No Maceration: No Maceration: No Dry/Scaly: No Dry/Scaly: No Dry/Scaly: No Periwound Skin Color: Atrophie Blanche: No Atrophie Blanche: No Atrophie Blanche: No Cyanosis: No Cyanosis: No Cyanosis: No Ecchymosis: No Ecchymosis: No Ecchymosis: No Erythema: No Erythema: No Erythema: No Hemosiderin Staining: No Hemosiderin Staining: No Hemosiderin Staining: No Mottled: No Mottled: No Mottled: No Pallor: No Pallor: No Pallor: No Rubor: No Rubor: No Rubor: No Temperature: No Abnormality No Abnormality No Abnormality Tenderness on Palpation: Yes Yes Yes Wound Preparation: Ulcer Cleansing: Ulcer Cleansing: Ulcer Cleansing: Rinsed/Irrigated with Saline Rinsed/Irrigated with Saline Rinsed/Irrigated with Saline Topical Anesthetic Applied: Topical Anesthetic Applied: Topical Anesthetic Applied: Other: lidocaine 4% Other: lidocaine 4% Other: lidocaine 4% Treatment Notes Electronic Signature(s) Signed: 10/27/2017 5:07:18 PM By: Renne Crigler Entered By: Renne Crigler on 10/27/2017 11:30:22 [name] ([id number]) -------------------------------------------------------------------------------- Multi-Disciplinary Care Plan Details Patient Name: [name] Date of Service: 10/27/2017 11:00 AM Medical Record Number: [id number] Patient Account Number: [id number] Date of Birth/Sex: April 12, 1951 (66 y.o. Female) Treating RN: Renne Crigler Primary Care Keyera Hattabaugh: Jarome Matin Other Clinician: Referring Aahna Rossa: Jarome Matin Treating Zaidy Absher/Extender: Linwood Dibbles, HOYT Weeks in Treatment: 13 Active Inactive ` Abuse / Safety / Falls / Self Care Management Nursing Diagnoses: Potential for falls Goals: Patient will not experience any injury related to falls Date Initiated: 07/24/2017 Target Resolution Date: 09/30/2017 Goal Status: Active Interventions: Assess fall risk on admission and as needed Notes: ` Orientation to the Wound Care Program Nursing Diagnoses: Knowledge deficit related to the wound healing center program Goals: Patient/caregiver will verbalize understanding of the Wound Healing Center Program Date Initiated: 07/24/2017 Target Resolution Date: 09/30/2017 Goal Status: Active Interventions: Provide education on orientation to the wound center Notes: ` Wound/Skin Impairment Nursing Diagnoses: Impaired tissue integrity Goals: Ulcer/skin breakdown will heal within 14 weeks Date Initiated: 07/24/2017 Target Resolution Date: 09/30/2017 Goal Status: Active Interventions: ELLYSON, RARICK IMarland Kitchen ([id number]) Assess patient/caregiver ability to obtain necessary supplies Assess patient/caregiver ability to perform ulcer/skin care regimen upon admission and as needed Assess ulceration(s) every visit Notes: Electronic Signature(s) Signed: 10/27/2017 5:07:18 PM By: Renne Crigler Entered By: Renne Crigler on 10/27/2017 11:30:10 [name] ([id number]) -------------------------------------------------------------------------------- Pain Assessment Details Patient Name: [name] Date of Service: 10/27/2017 11:00 AM Medical Record Number: [id number] Patient Account Number: [id number] Date of Birth/Sex:  12/25/1950 (66 y.o. Female) Treating RN: Flinchum,  Elnita Maxwell Primary Care Keishawn Rajewski: Jarome Matin Other Clinician: Referring Queen Abbett: Jarome Matin Treating Vesper Trant/Extender: Linwood Dibbles, HOYT Weeks in Treatment: 13 Active Problems Location of Pain Severity and Description of Pain Patient Has Paino Yes Site Locations Pain Location: Pain in Ulcers Duration of the Pain. Constant / Intermittento Intermittent Rate the pain. Current Pain Level: 4 Character of Pain Describe the Pain: Burning Pain Management and Medication Current Pain Management: Electronic Signature(s) Signed: 10/27/2017 5:07:18 PM By: Renne Crigler Entered By: Renne Crigler on 10/27/2017 11:13:39 [name] ([id number]) -------------------------------------------------------------------------------- Wound Assessment Details Patient Name: [name] Date of Service: 10/27/2017 11:00 AM Medical Record Number: [id number] Patient Account Number: [id number] Date of Birth/Sex: 1950-10-19 (66 y.o. Female) Treating RN: Renne Crigler Primary Care Nuria Phebus: Jarome Matin Other Clinician: Referring Vern Prestia: Jarome Matin Treating Manning Luna/Extender: STONE III, HOYT Weeks in Treatment: 13 Wound Status Wound Number: 1 Primary Etiology: Neuropathic Ulcer-Non Diabetic Wound Location: Left Metatarsal head first - Plantar Wound Status: Open Wounding Event: Gradually Appeared Comorbid History: Arrhythmia, Hypertension, Neuropathy Date Acquired: 01/22/2017 Weeks Of Treatment: 13 Clustered Wound: No Photos Photo Uploaded By: Renne Crigler on 10/27/2017 17:11:10 Wound Measurements Length: (cm) 0.3 Width: (cm) 5 Depth: (cm) 0.4 Area: (cm) 1.178 Volume: (cm) 0.471 % Reduction in Area: -834.9% % Reduction in Volume: -647.6% Epithelialization: None Tunneling: No Undermining: No Wound Description Full Thickness With Exposed Support Classification: Structures Wound Margin: Distinct,  outline attached Exudate Large Amount: Exudate Type: Serosanguineous Exudate Color: red, brown Foul Odor After Cleansing: No Slough/Fibrino Yes Wound Bed Granulation Amount: Large (67-100%) Exposed Structure Granulation Quality: Red Fascia Exposed: No Necrotic Amount: Small (1-33%) Fat Layer (Subcutaneous Tissue) Exposed: Yes Necrotic Quality: Eschar, Adherent Slough Tendon Exposed: No Muscle Exposed: No Joint Exposed: No Bone Exposed: No Reedy, Kemper I. ([id number]) Periwound Skin Texture Texture Color No Abnormalities Noted: No No Abnormalities Noted: No Callus: Yes Atrophie Blanche: No Crepitus: No Cyanosis: No Excoriation: No Ecchymosis: No Induration: No Erythema: No Rash: No Hemosiderin Staining: No Scarring: No Mottled: No Pallor: No Moisture Rubor: No No Abnormalities Noted: No Dry / Scaly: No Temperature / Pain Maceration: No Temperature: No Abnormality Tenderness on Palpation: Yes Wound Preparation Ulcer Cleansing: Rinsed/Irrigated with Saline Topical Anesthetic Applied: Other: lidocaine 4%, Electronic Signature(s) Signed: 10/27/2017 5:07:18 PM By: Renne Crigler Entered By: Renne Crigler on 10/27/2017 11:26:08 [name] ([id number]) -------------------------------------------------------------------------------- Wound Assessment Details Patient Name: [name] Date of Service: 10/27/2017 11:00 AM Medical Record Number: [id number] Patient Account Number: [id number] Date of Birth/Sex: 1951-06-08 (66 y.o. Female) Treating RN: Renne Crigler Primary Care Samyia Motter: Jarome Matin Other Clinician: Referring Sherisse Fullilove: Jarome Matin Treating Dierdra Salameh/Extender: STONE III, HOYT Weeks in Treatment: 13 Wound Status Wound Number: 2 Primary Etiology: Neuropathic Ulcer-Non Diabetic Wound Location: Left Metatarsal head fifth - Plantar Wound Status: Open Wounding Event: Trauma Comorbid History: Arrhythmia, Hypertension,  Neuropathy Date Acquired: 05/27/2017 Weeks Of Treatment: 13 Clustered Wound: No Photos Photo Uploaded By: Renne Crigler on 10/27/2017 17:11:37 Wound Measurements Length: (cm) 0.2 Width: (cm) 0.2 Depth: (cm) 0.3 Area: (cm) 0.031 Volume: (cm) 0.009 % Reduction in Area: 97% % Reduction in Volume: 91.2% Epithelialization: None Tunneling: No Undermining: No Wound Description Full Thickness With Exposed Support Classification: Structures Wound Margin: Distinct, outline attached Exudate Large Amount: Exudate Type: Serosanguineous Exudate Color: red, brown Foul Odor After Cleansing: No Slough/Fibrino Yes Wound Bed Granulation Amount: Medium (34-66%) Exposed Structure Granulation Quality: Red, Hyper-granulation Fascia Exposed: No Necrotic Amount: Medium (34-66%) Fat Layer (Subcutaneous Tissue) Exposed: Yes Necrotic Quality: Adherent Slough Tendon Exposed: No  Muscle Exposed: No Joint Exposed: No Bone Exposed: No Klarich, Jamilah I. ([id number]) Periwound Skin Texture Texture Color No Abnormalities Noted: No No Abnormalities Noted: No Callus: Yes Atrophie Blanche: No Crepitus: No Cyanosis: No Excoriation: No Ecchymosis: No Induration: No Erythema: No Rash: No Hemosiderin Staining: No Scarring: No Mottled: No Pallor: No Moisture Rubor: No No Abnormalities Noted: No Dry / Scaly: No Temperature / Pain Maceration: No Temperature: No Abnormality Tenderness on Palpation: Yes Wound Preparation Ulcer Cleansing: Rinsed/Irrigated with Saline Topical Anesthetic Applied: Other: lidocaine 4%, Electronic Signature(s) Signed: 10/27/2017 5:07:18 PM By: Renne CriglerFlinchum, Cheryl Entered By: Renne CriglerFlinchum, Cheryl on 10/27/2017 11:27:03 [name], Yvette I. ([id number]) -------------------------------------------------------------------------------- Wound Assessment Details Patient Name: [name], Yvette I. Date of Service: 10/27/2017 11:00 AM Medical Record Number: [id number] Patient  Account Number: [id number] Date of Birth/Sex: Dec 26, 1950 (66 y.o. Female) Treating RN: Renne CriglerFlinchum, Cheryl Primary Care Chiquita Heckert: Jarome MatinPATERSON, DANIEL Other Clinician: Referring Dearis Danis: Jarome MatinPATERSON, DANIEL Treating Javione Gunawan/Extender: STONE III, HOYT Weeks in Treatment: 13 Wound Status Wound Number: 3 Primary Etiology: Neuropathic Ulcer-Non Diabetic Wound Location: Right Metatarsal head first - Plantar Wound Status: Open Wounding Event: Gradually Appeared Comorbid History: Arrhythmia, Hypertension, Neuropathy Date Acquired: 10/05/2017 Weeks Of Treatment: 3 Clustered Wound: No Photos Photo Uploaded By: Renne CriglerFlinchum, Cheryl on 10/27/2017 17:11:38 Wound Measurements Length: (cm) 0.4 Width: (cm) 0.5 Depth: (cm) 0.2 Area: (cm) 0.157 Volume: (cm) 0.031 % Reduction in Area: -149.2% % Reduction in Volume: -138.5% Epithelialization: None Tunneling: No Undermining: No Wound Description Full Thickness With Exposed Support Classification: Structures Wound Margin: Flat and Intact Exudate Medium Amount: Exudate Type: Serous Exudate Color: amber Foul Odor After Cleansing: No Slough/Fibrino No Wound Bed Granulation Amount: Large (67-100%) Exposed Structure Granulation Quality: Pink Fascia Exposed: No Necrotic Amount: Small (1-33%) Fat Layer (Subcutaneous Tissue) Exposed: Yes Necrotic Quality: Adherent Slough Tendon Exposed: No Muscle Exposed: No Joint Exposed: No Bone Exposed: No Pizana, Lyniah I. ([id number]) Periwound Skin Texture Texture Color No Abnormalities Noted: No No Abnormalities Noted: No Callus: Yes Atrophie Blanche: No Crepitus: No Cyanosis: No Excoriation: No Ecchymosis: No Induration: No Erythema: No Rash: No Hemosiderin Staining: No Scarring: No Mottled: No Pallor: No Moisture Rubor: No No Abnormalities Noted: No Dry / Scaly: No Temperature / Pain Maceration: No Temperature: No Abnormality Tenderness on Palpation: Yes Wound Preparation Ulcer  Cleansing: Rinsed/Irrigated with Saline Topical Anesthetic Applied: Other: lidocaine 4%, Electronic Signature(s) Signed: 10/27/2017 5:07:18 PM By: Renne CriglerFlinchum, Cheryl Entered By: Renne CriglerFlinchum, Cheryl on 10/27/2017 11:28:43 [name], Yvette I. ([id number]) -------------------------------------------------------------------------------- Vitals Details Patient Name: [name], Yvette I. Date of Service: 10/27/2017 11:00 AM Medical Record Number: [id number] Patient Account Number: [id number] Date of Birth/Sex: Dec 26, 1950 (66 y.o. Female) Treating RN: Renne CriglerFlinchum, Cheryl Primary Care Junell Cullifer: Jarome MatinPATERSON, DANIEL Other Clinician: Referring Jiyaan Steinhauser: Jarome MatinPATERSON, DANIEL Treating Alexias Margerum/Extender: Linwood DibblesSTONE III, HOYT Weeks in Treatment: 13 Vital Signs Time Taken: 11:13 Temperature (F): 97.9 Height (in): 64 Pulse (bpm): 50 Weight (lbs): 262 Respiratory Rate (breaths/min): 18 Body Mass Index (BMI): 45 Blood Pressure (mmHg): 115/51 Reference Range: 80 - 120 mg / dl Electronic Signature(s) Signed: 10/27/2017 5:07:18 PM By: Renne CriglerFlinchum, Cheryl Entered By: Renne CriglerFlinchum, Cheryl on 10/27/2017 11:14:02

## 2017-11-02 ENCOUNTER — Ambulatory Visit: Payer: PPO | Admitting: Nurse Practitioner

## 2017-11-03 ENCOUNTER — Ambulatory Visit: Payer: PPO | Admitting: Physician Assistant

## 2017-11-03 DIAGNOSIS — E7849 Other hyperlipidemia: Secondary | ICD-10-CM | POA: Diagnosis not present

## 2017-11-03 DIAGNOSIS — E11621 Type 2 diabetes mellitus with foot ulcer: Secondary | ICD-10-CM | POA: Diagnosis not present

## 2017-11-03 DIAGNOSIS — D6489 Other specified anemias: Secondary | ICD-10-CM | POA: Diagnosis not present

## 2017-11-03 DIAGNOSIS — Z1389 Encounter for screening for other disorder: Secondary | ICD-10-CM | POA: Diagnosis not present

## 2017-11-03 DIAGNOSIS — I7389 Other specified peripheral vascular diseases: Secondary | ICD-10-CM | POA: Diagnosis not present

## 2017-11-03 DIAGNOSIS — H8113 Benign paroxysmal vertigo, bilateral: Secondary | ICD-10-CM | POA: Diagnosis not present

## 2017-11-03 DIAGNOSIS — Z6841 Body Mass Index (BMI) 40.0 and over, adult: Secondary | ICD-10-CM | POA: Diagnosis not present

## 2017-11-07 DIAGNOSIS — R42 Dizziness and giddiness: Secondary | ICD-10-CM | POA: Diagnosis not present

## 2017-11-07 DIAGNOSIS — H903 Sensorineural hearing loss, bilateral: Secondary | ICD-10-CM | POA: Diagnosis not present

## 2017-11-07 DIAGNOSIS — H6123 Impacted cerumen, bilateral: Secondary | ICD-10-CM | POA: Diagnosis not present

## 2017-11-07 DIAGNOSIS — K219 Gastro-esophageal reflux disease without esophagitis: Secondary | ICD-10-CM | POA: Diagnosis not present

## 2017-11-08 ENCOUNTER — Ambulatory Visit (HOSPITAL_COMMUNITY)
Admit: 2017-11-08 | Discharge: 2017-11-08 | Disposition: A | Discharge status: Home / Self Care | Payer: PPO | Attending: Surgery | Admitting: Surgery

## 2017-11-08 ENCOUNTER — Ambulatory Visit (INDEPENDENT_AMBULATORY_CARE_PROVIDER_SITE_OTHER)
Admit: 2017-11-08 | Discharge: 2017-11-08 | Disposition: A | Discharge status: Home / Self Care | Payer: PPO | Attending: Surgery | Admitting: Surgery

## 2017-11-08 DIAGNOSIS — Z95828 Presence of other vascular implants and grafts: Secondary | ICD-10-CM | POA: Insufficient documentation

## 2017-11-08 DIAGNOSIS — I739 Peripheral vascular disease, unspecified: Secondary | ICD-10-CM

## 2017-11-10 ENCOUNTER — Encounter: Payer: PPO | Admitting: Physician Assistant

## 2017-11-10 DIAGNOSIS — G603 Idiopathic progressive neuropathy: Secondary | ICD-10-CM | POA: Diagnosis not present

## 2017-11-10 DIAGNOSIS — L97522 Non-pressure chronic ulcer of other part of left foot with fat layer exposed: Secondary | ICD-10-CM | POA: Diagnosis not present

## 2017-11-10 DIAGNOSIS — L97512 Non-pressure chronic ulcer of other part of right foot with fat layer exposed: Secondary | ICD-10-CM | POA: Diagnosis not present

## 2017-11-13 ENCOUNTER — Other Ambulatory Visit: Payer: Self-pay | Admitting: Physician Assistant

## 2017-11-13 ENCOUNTER — Ambulatory Visit: Payer: PPO | Admitting: Surgery

## 2017-11-13 DIAGNOSIS — L97522 Non-pressure chronic ulcer of other part of left foot with fat layer exposed: Secondary | ICD-10-CM

## 2017-11-13 NOTE — Progress Notes (Addendum)
SYLVA, OVERLEY ([id number]) Visit Report for 11/10/2017 Arrival Information Details Patient Name: [name], [name] Date of Service: 11/10/2017 11:00 AM Medical Record Number: [id number] Patient Account Number: [id number] Date of Birth/Sex: 25-Dec-1950 (67 y.o. Female) Treating RN: Renne Crigler Primary Care Tron Flythe: Jarome Matin Other Clinician: Referring Refael Fulop: Jarome Matin Treating Keoni Havey/Extender: Linwood Dibbles, HOYT Weeks in Treatment: 15 Visit Information History Since Last Visit All ordered tests and consults were completed: No Patient Arrived: Ambulatory Added or deleted any medications: No Arrival Time: 11:19 Any new allergies or adverse reactions: No Accompanied By: self Had a fall or experienced change in No Transfer Assistance: None activities of daily living that may affect Patient Identification Verified: Yes risk of falls: Secondary Verification Process Yes Signs or symptoms of abuse/neglect since last visito No Completed: Hospitalized since last visit: No Patient Has Alerts: Yes Pain Present Now: No Patient Alerts: Patient on Blood Thinner Eliquis R ABI 0.98 11/08/17 L ABI 1.05 11/08/17 R TBI 0.73 11/08/17 L TBI 0.80 11/08/17 Electronic Signature(s) Signed: 11/15/2017 8:30:00 AM By: Renne Crigler Previous Signature: 11/10/2017 1:58:36 PM Version By: Renne Crigler Entered By: Renne Crigler on 11/15/2017 08:30:00 [name] ([id number]) -------------------------------------------------------------------------------- Lower Extremity Assessment Details Patient Name: [name] Date of Service: 11/10/2017 11:00 AM Medical Record Number: [id number] Patient Account Number: [id number] Date of Birth/Sex: 01/22/1951 (67 y.o. Female) Treating RN: Renne Crigler Primary Care Pamela Intrieri: Jarome Matin Other Clinician: Referring Hriday Stai: Jarome Matin Treating Vinetta Brach/Extender: Linwood Dibbles, HOYT Weeks in Treatment: 15 Edema  Assessment Assessed: [Left: No] [Right: No] Edema: [Left: Ye] [Right: s] Vascular Assessment Claudication: Claudication Assessment [Left:None] [Right:None] Pulses: Dorsalis Pedis Palpable: [Left:Yes] [Right:Yes] Posterior Tibial Extremity colors, hair growth, and conditions: Extremity Color: [Left:Normal] [Right:Normal] Hair Growth on Extremity: [Left:Yes] [Right:Yes] Temperature of Extremity: [Right:Cool] Capillary Refill: [Left:< 3 seconds] [Right:< 3 seconds] Toe Nail Assessment Left: Right: Thick: Yes Yes Discolored: No No Deformed: No No Improper Length and Hygiene: No No Electronic Signature(s) Signed: 11/10/2017 1:58:36 PM By: Renne Crigler Entered By: Renne Crigler on 11/10/2017 11:39:15 [name] ([id number]) -------------------------------------------------------------------------------- Multi Wound Chart Details Patient Name: [name] Date of Service: 11/10/2017 11:00 AM Medical Record Number: [id number] Patient Account Number: [id number] Date of Birth/Sex: 1950-11-10 (67 y.o. Female) Treating RN: Renne Crigler Primary Care Walton Digilio: Jarome Matin Other Clinician: Referring Varina Hulon: Jarome Matin Treating Abigaelle Verley/Extender: Linwood Dibbles, HOYT Weeks in Treatment: 15 Vital Signs Height(in): 64 Pulse(bpm): 65 Weight(lbs): 262 Blood Pressure(mmHg): 120/50 Body Mass Index(BMI): 45 Temperature(F): 97.5 Respiratory Rate 18 (breaths/min): Photos: [1:No Photos] [2:No Photos] [3:No Photos] Wound Location: [1:Left Metatarsal head first - Plantar] [2:Left, Plantar Metatarsal head fifth] [3:Right Metatarsal head first - Plantar] Wounding Event: [1:Gradually Appeared] [2:Trauma] [3:Gradually Appeared] 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] [2:Neuropathic Ulcer-Non Diabetic] [3:Neuropathic Ulcer-Non Diabetic] Comorbid History: [1:Arrhythmia, Hypertension, Neuropathy] [2:Arrhythmia, Hypertension, Neuropathy] [3:Arrhythmia,  Hypertension, Neuropathy] Date Acquired: [1:01/22/2017] [2:05/27/2017] [3:10/05/2017] Weeks of Treatment: [1:15] [2:15] [3:5] Wound Status: [1:Open] [2:Open] [3:Open] Measurements L x W x D [1:0.2x0.3x0.3] [2:1x1.1x0.3] [3:0.4x0.3x0.2] (cm) Area (cm) : [1:0.047] [2:0.864] [3:0.094] Volume (cm) : [1:0.014] [2:0.259] [3:0.019] % Reduction in Area: [1:62.70%] [2:15.40%] [3:-49.20%] % Reduction in Volume: [1:77.80%] [2:-153.90%] [3:-46.20%] Classification: [1:Full Thickness With Exposed Support Structures] [2:Full Thickness With Exposed Support Structures] [3:Full Thickness With Exposed Support Structures] Exudate Amount: [1:Large] [2:Large] [3:Medium] Exudate Type: [1:Serosanguineous] [2:Serosanguineous] [3:Serous] Exudate Color: [1:red, brown] [2:red, brown] [3:amber] Wound Margin: [1:Distinct, outline attached] [2:Distinct, outline attached] [3:Flat and Intact] Granulation Amount: [1:Medium (34-66%)] [2:Medium (34-66%)] [3:Medium (34-66%)] Granulation Quality: [1:Red] [2:Red, Hyper-granulation] [3:Pink] Necrotic Amount: [1:Medium (  34-66%)] [2:Medium (34-66%)] [3:Medium (34-66%)] Necrotic Tissue: [1:Eschar, Adherent Slough] [2:Adherent Slough] [3:Adherent Slough] Exposed Structures: [1:Fat Layer (Subcutaneous Tissue) Exposed: Yes Fascia: No Tendon: No Muscle: No Joint: No Bone: No] [2:Fat Layer (Subcutaneous Tissue) Exposed: Yes Fascia: No Tendon: No Muscle: No Joint: No Bone: No] [3:Fat Layer (Subcutaneous Tissue) Exposed: Yes  Fascia: No Tendon: No Muscle: No Joint: No Bone: No] Epithelialization: [1:None] [2:None] [3:None] Debridement: [1:Debridement ([id number]) 11:48] [2:Debridement ([id number]) 11:48] [3:Debridement ([id number]) 11:48] Pre-procedure Verification/Time Out Taken: Pain Control: Other Other Other Tissue Debrided: Fibrin/Slough, Skin, Callus, Fibrin/Slough, Skin, Callus, Fibrin/Slough, Skin, Callus, Subcutaneous Subcutaneous Subcutaneous Level: Skin/Subcutaneous Tissue  Skin/Subcutaneous Tissue Skin/Subcutaneous Tissue Debridement Area (sq cm): 0.06 1.1 0.12 Instrument: Curette Curette Curette Bleeding: Minimum Minimum Minimum Hemostasis Achieved: Pressure Pressure Pressure Procedural Pain: 0 0 0 Post Procedural Pain: 0 0 0 Debridement Treatment Procedure was tolerated well Procedure was tolerated well Procedure was tolerated well Response: Post Debridement 0.2x0.3x0.4 1x1.1x0.3 0.4x0.3x0.3 Measurements L x W x D (cm) Post Debridement Volume: 0.019 0.259 0.028 (cm) Periwound Skin Texture: Callus: Yes Callus: Yes Callus: Yes Excoriation: No Excoriation: No Excoriation: No Induration: No Induration: No Induration: No Crepitus: No Crepitus: No Crepitus: No Rash: No Rash: No Rash: No Scarring: No Scarring: No Scarring: No Periwound Skin Moisture: Maceration: No Maceration: No Maceration: No Dry/Scaly: No Dry/Scaly: No Dry/Scaly: No Periwound Skin Color: Atrophie Blanche: No Atrophie Blanche: No Atrophie Blanche: No Cyanosis: No Cyanosis: No Cyanosis: No Ecchymosis: No Ecchymosis: No Ecchymosis: No Erythema: No Erythema: No Erythema: No Hemosiderin Staining: No Hemosiderin Staining: No Hemosiderin Staining: No Mottled: No Mottled: No Mottled: No Pallor: No Pallor: No Pallor: No Rubor: No Rubor: No Rubor: No Temperature: No Abnormality No Abnormality No Abnormality Tenderness on Palpation: Yes Yes Yes Wound Preparation: Ulcer Cleansing: Ulcer Cleansing: Ulcer Cleansing: Rinsed/Irrigated with Saline Rinsed/Irrigated with Saline Rinsed/Irrigated with Saline Topical Anesthetic Applied: Topical Anesthetic Applied: Topical Anesthetic Applied: Other: lidocaine 4% Other: lidocaine 4% Other: lidocaine 4% Procedures Performed: Debridement Debridement Debridement Treatment Notes Electronic Signature(s) Signed: 11/10/2017 1:58:36 PM By: Renne Crigler Entered By: Renne Crigler on 11/10/2017 11:54:05 [name] ([id number]) -------------------------------------------------------------------------------- Multi-Disciplinary Care Plan Details Patient Name: [name] Date of Service: 11/10/2017 11:00 AM Medical Record Number: [id number] Patient Account Number: [id number] Date of Birth/Sex: 07-13-1951 (66 y.o. Female) Treating RN: Renne Crigler Primary Care Edrian Melucci: Jarome Matin Other Clinician: Referring Nicole Hafley: Jarome Matin Treating Roben Tatsch/Extender: Linwood Dibbles, HOYT Weeks in Treatment: 15 Active Inactive ` Abuse / Safety / Falls / Self Care Management Nursing Diagnoses: Potential for falls Goals: Patient will not experience any injury related to falls Date Initiated: 07/24/2017 Target Resolution Date: 09/30/2017 Goal Status: Active Interventions: Assess fall risk on admission and as needed Notes: ` Orientation to the Wound Care Program Nursing Diagnoses: Knowledge deficit related to the wound healing center program Goals: Patient/caregiver will verbalize understanding of the Wound Healing Center Program Date Initiated: 07/24/2017 Target Resolution Date: 09/30/2017 Goal Status: Active Interventions: Provide education on orientation to the wound center Notes: ` Wound/Skin Impairment Nursing Diagnoses: Impaired tissue integrity Goals: Ulcer/skin breakdown will heal within 14 weeks Date Initiated: 07/24/2017 Target Resolution Date: 09/30/2017 Goal Status: Active Interventions: KALILA, ADKISON IMarland Kitchen ([id number]) Assess patient/caregiver ability to obtain necessary supplies Assess patient/caregiver ability to perform ulcer/skin care regimen upon admission and as needed Assess ulceration(s) every visit Notes: Electronic Signature(s) Signed: 11/10/2017 1:58:36 PM By: Renne Crigler Entered By: Renne Crigler on 11/10/2017 11:53:51 Smiddy, Eber Jones I. ([id number]) -------------------------------------------------------------------------------- Pain  Assessment Details Patient Name: Cato Mulligan I. Date  of Service: 11/10/2017 11:00 AM Medical Record Number: [id number] Patient Account Number: [id number] Date of Birth/Sex: [birth year] (66 y.o. Female) Treating RN: Renne Crigler Primary Care Ashawna Hanback: Jarome Matin Other Clinician: Referring Ivyrose Hashman: Jarome Matin Treating Edie Vallandingham/Extender: Linwood Dibbles, HOYT Weeks in Treatment: 15 Active Problems Location of Pain Severity and Description of Pain Patient Has Paino Yes Site Locations Rate the pain. Current Pain Level: 2 Pain Management and Medication Current Pain Management: Notes states discomfort with ambulating Electronic Signature(s) Signed: 11/10/2017 1:58:36 PM By: Renne Crigler Entered By: Renne Crigler on 11/10/2017 11:23:07 [name] ([id number]) -------------------------------------------------------------------------------- Wound Assessment Details Patient Name: [name] Date of Service: 11/10/2017 11:00 AM Medical Record Number: [id number] Patient Account Number: [id number] Date of Birth/Sex: Nov 13, 1950 (66 y.o. Female) Treating RN: Renne Crigler Primary Care Shaylee Stanislawski: Jarome Matin Other Clinician: Referring Mckinzi Eriksen: Jarome Matin Treating Traquan Duarte/Extender: STONE III, HOYT Weeks in Treatment: 15 Wound Status Wound Number: 1 Primary Etiology: Neuropathic Ulcer-Non Diabetic Wound Location: Left Metatarsal head first - Plantar Wound Status: Open Wounding Event: Gradually Appeared Comorbid History: Arrhythmia, Hypertension, Neuropathy Date Acquired: 01/22/2017 Weeks Of Treatment: 15 Clustered Wound: No Wound Measurements Length: (cm) 0.2 Width: (cm) 0.3 Depth: (cm) 0.3 Area: (cm) 0.047 Volume: (cm) 0.014 % Reduction in Area: 62.7% % Reduction in Volume: 77.8% Epithelialization: None Tunneling: No Undermining: No Wound Description Full Thickness With Exposed Support Classification: Structures Wound Margin:  Distinct, outline attached Exudate Large Amount: Exudate Type: Serosanguineous Exudate Color: red, brown Foul Odor After Cleansing: No Slough/Fibrino Yes Wound Bed Granulation Amount: Medium (34-66%) Exposed Structure Granulation Quality: Red Fascia Exposed: No Necrotic Amount: Medium (34-66%) Fat Layer (Subcutaneous Tissue) Exposed: Yes Necrotic Quality: Eschar, Adherent Slough Tendon Exposed: No Muscle Exposed: No Joint Exposed: No Bone Exposed: No Periwound Skin Texture Texture Color No Abnormalities Noted: No No Abnormalities Noted: No Callus: Yes Atrophie Blanche: No Crepitus: No Cyanosis: No Excoriation: No Ecchymosis: No Induration: No Erythema: No Rash: No Hemosiderin Staining: No Scarring: No Mottled: No Pallor: No Moisture Rubor: No No Abnormalities Noted: No Dry / Scaly: No Temperature / Pain [place], Norleen I. ([id number]) Maceration: No Temperature: No Abnormality Tenderness on Palpation: Yes Wound Preparation Ulcer Cleansing: Rinsed/Irrigated with Saline Topical Anesthetic Applied: Other: lidocaine 4%, Electronic Signature(s) Signed: 11/10/2017 1:58:36 PM By: Renne Crigler Entered By: Renne Crigler on 11/10/2017 11:35:56 [name] ([id number]) -------------------------------------------------------------------------------- Wound Assessment Details Patient Name: [name] Date of Service: 11/10/2017 11:00 AM Medical Record Number: [id number] Patient Account Number: [id number] Date of Birth/Sex: 1951-07-01 (66 y.o. Female) Treating RN: Renne Crigler Primary Care Thoma Paulsen: Jarome Matin Other Clinician: Referring Javares Kaufhold: Jarome Matin Treating Dorota Heinrichs/Extender: STONE III, HOYT Weeks in Treatment: 15 Wound Status Wound Number: 2 Primary Etiology: Neuropathic Ulcer-Non Diabetic Wound Location: Left, Plantar Metatarsal head fifth Wound Status: Open Wounding Event: Trauma Comorbid History: Arrhythmia,  Hypertension, Neuropathy Date Acquired: 05/27/2017 Weeks Of Treatment: 15 Clustered Wound: No Wound Measurements Length: (cm) 1 Width: (cm) 1.1 Depth: (cm) 0.3 Area: (cm) 0.864 Volume: (cm) 0.259 % Reduction in Area: 15.4% % Reduction in Volume: -153.9% Epithelialization: None Tunneling: No Undermining: No Wound Description Full Thickness With Exposed Support Classification: Structures Wound Margin: Distinct, outline attached Exudate Large Amount: Exudate Type: Serosanguineous Exudate Color: red, brown Foul Odor After Cleansing: No Slough/Fibrino Yes Wound Bed Granulation Amount: Medium (34-66%) Exposed Structure Granulation Quality: Red, Hyper-granulation Fascia Exposed: No Necrotic Amount: Medium (34-66%) Fat Layer (Subcutaneous Tissue) Exposed: Yes Necrotic Quality: Adherent Slough Tendon Exposed: No Muscle Exposed: No Joint Exposed: No Bone Exposed: No Periwound Skin  Texture Texture Color No Abnormalities Noted: No No Abnormalities Noted: No Callus: Yes Atrophie Blanche: No Crepitus: No Cyanosis: No Excoriation: No Ecchymosis: No Induration: No Erythema: No Rash: No Hemosiderin Staining: No Scarring: No Mottled: No Pallor: No Moisture Rubor: No No Abnormalities Noted: No Dry / Scaly: No Temperature / Pain Chestang, Mahrukh I. ([id number]) Maceration: No Temperature: No Abnormality Tenderness on Palpation: Yes Wound Preparation Ulcer Cleansing: Rinsed/Irrigated with Saline Topical Anesthetic Applied: Other: lidocaine 4%, Electronic Signature(s) Signed: 11/10/2017 1:58:36 PM By: Renne CriglerFlinchum, Cheryl Entered By: Renne CriglerFlinchum, Cheryl on 11/10/2017 11:53:39 Yvette ShutterSUTPHEN, Andreah I. ([id number]) -------------------------------------------------------------------------------- Wound Assessment Details Patient Name: Yvette ShutterSUTPHEN, Jasmene I. Date of Service: 11/10/2017 11:00 AM Medical Record Number: [id number] Patient Account Number: [id number] Date of Birth/Sex:  07/03/1951 (66 y.o. Female) Treating RN: Renne CriglerFlinchum, Cheryl Primary Care Donise Woodle: Jarome MatinPATERSON, DANIEL Other Clinician: Referring Boston Cookson: Jarome MatinPATERSON, DANIEL Treating Jeniffer Culliver/Extender: STONE III, HOYT Weeks in Treatment: 15 Wound Status Wound Number: 3 Primary Etiology: Neuropathic Ulcer-Non Diabetic Wound Location: Right Metatarsal head first - Plantar Wound Status: Open Wounding Event: Gradually Appeared Comorbid History: Arrhythmia, Hypertension, Neuropathy Date Acquired: 10/05/2017 Weeks Of Treatment: 5 Clustered Wound: No Wound Measurements Length: (cm) 0.4 Width: (cm) 0.3 Depth: (cm) 0.2 Area: (cm) 0.094 Volume: (cm) 0.019 % Reduction in Area: -49.2% % Reduction in Volume: -46.2% Epithelialization: None Tunneling: No Undermining: No Wound Description Full Thickness With Exposed Support Classification: Structures Wound Margin: Flat and Intact Exudate Medium Amount: Exudate Type: Serous Exudate Color: amber Foul Odor After Cleansing: No Slough/Fibrino No Wound Bed Granulation Amount: Medium (34-66%) Exposed Structure Granulation Quality: Pink Fascia Exposed: No Necrotic Amount: Medium (34-66%) Fat Layer (Subcutaneous Tissue) Exposed: Yes Necrotic Quality: Adherent Slough Tendon Exposed: No Muscle Exposed: No Joint Exposed: No Bone Exposed: No Periwound Skin Texture Texture Color No Abnormalities Noted: No No Abnormalities Noted: No Callus: Yes Atrophie Blanche: No Crepitus: No Cyanosis: No Excoriation: No Ecchymosis: No Induration: No Erythema: No Rash: No Hemosiderin Staining: No Scarring: No Mottled: No Pallor: No Moisture Rubor: No No Abnormalities Noted: No Dry / Scaly: No Temperature / Pain Curnow, Aydee I. ([id number]) Maceration: No Temperature: No Abnormality Tenderness on Palpation: Yes Wound Preparation Ulcer Cleansing: Rinsed/Irrigated with Saline Topical Anesthetic Applied: Other: lidocaine 4%, Electronic  Signature(s) Signed: 11/10/2017 1:58:36 PM By: Renne CriglerFlinchum, Cheryl Entered By: Renne CriglerFlinchum, Cheryl on 11/10/2017 11:36:57 Yvette ShutterSUTPHEN, Alohilani I. ([id number]) -------------------------------------------------------------------------------- Vitals Details Patient Name: Yvette ShutterSUTPHEN, Jatasia I. Date of Service: 11/10/2017 11:00 AM Medical Record Number: [id number] Patient Account Number: [id number] Date of Birth/Sex: 07/03/1951 (66 y.o. Female) Treating RN: Renne CriglerFlinchum, Cheryl Primary Care Trish Mancinelli: Jarome MatinPATERSON, DANIEL Other Clinician: Referring Jevaeh Shams: Jarome MatinPATERSON, DANIEL Treating Randye Treichler/Extender: STONE III, HOYT Weeks in Treatment: 15 Vital Signs Time Taken: 11:24 Temperature (F): 97.5 Height (in): 64 Pulse (bpm): 65 Weight (lbs): 262 Respiratory Rate (breaths/min): 18 Body Mass Index (BMI): 45 Blood Pressure (mmHg): 120/50 Reference Range: 80 - 120 mg / dl Electronic Signature(s) Signed: 11/10/2017 1:58:36 PM By: Renne CriglerFlinchum, Cheryl Entered By: Renne CriglerFlinchum, Cheryl on 11/10/2017 11:25:30

## 2017-11-14 NOTE — Progress Notes (Signed)
TABYTHA, GRADILLAS ([id number]) Visit Report for 11/10/2017 Chief Complaint Document Details Patient Name: [name], [name] Date of Service: 11/10/2017 11:00 AM Medical Record Number: [id number] Patient Account Number: [id number] Date of Birth/Sex: Nov 01, 1950 (66 y.o. Female) Treating RN: Renne Crigler Primary Care Provider: Jarome Matin Other Clinician: Referring Provider: Jarome Matin Treating Provider/Extender: Linwood Dibbles, HOYT Weeks in Treatment: 15 Information Obtained from: Patient Chief Complaint Patient presents to the wound care center for a consult due non healing wound to the left plantar foot which she's had on and off for a number of years Electronic Signature(s) Signed: 11/13/2017 8:04:39 AM By: Lenda Kelp PA-C Entered By: Lenda Kelp on 11/10/2017 11:41:01 [name] ([id number]) -------------------------------------------------------------------------------- Debridement Details Patient Name: [name] Date of Service: 11/10/2017 11:00 AM Medical Record Number: [id number] Patient Account Number: [id number] Date of Birth/Sex: 1951/08/22 (66 y.o. Female) Treating RN: Renne Crigler Primary Care Provider: Jarome Matin Other Clinician: Referring Provider: Jarome Matin Treating Provider/Extender: Linwood Dibbles, HOYT Weeks in Treatment: 15 Debridement Performed for Wound #3 Right,Plantar Metatarsal head first Assessment: Performed By: Physician STONE III, HOYT E., PA-C Debridement: Debridement Pre-procedure Verification/Time Yes - 11:48 Out Taken: Start Time: 11:48 Pain Control: Other : lidocaine 4% Level: Skin/Subcutaneous Tissue Total Area Debrided (L x W): 0.4 (cm) x 0.3 (cm) = 0.12 (cm) Tissue and other material Viable, Non-Viable, Callus, Fibrin/Slough, Skin, Subcutaneous debrided: Instrument: Curette Bleeding: Minimum Hemostasis Achieved: Pressure End Time: 11:49 Procedural Pain: 0 Post Procedural Pain:  0 Response to Treatment: Procedure was tolerated well Post Debridement Measurements of Total Wound Length: (cm) 0.4 Width: (cm) 0.3 Depth: (cm) 0.3 Volume: (cm) 0.028 Character of Wound/Ulcer Post Debridement: Stable Post Procedure Diagnosis Same as Pre-procedure Electronic Signature(s) Signed: 11/10/2017 1:58:36 PM By: Renne Crigler Signed: 11/13/2017 8:04:39 AM By: Lenda Kelp PA-C Entered By: Renne Crigler on 11/10/2017 11:49:34 [name] ([id number]) -------------------------------------------------------------------------------- Debridement Details Patient Name: [name] Date of Service: 11/10/2017 11:00 AM Medical Record Number: [id number] Patient Account Number: [id number] Date of Birth/Sex: 1950-11-07 (66 y.o. Female) Treating RN: Renne Crigler Primary Care Provider: Jarome Matin Other Clinician: Referring Provider: Jarome Matin Treating Provider/Extender: Linwood Dibbles, HOYT Weeks in Treatment: 15 Debridement Performed for Wound #1 Left,Plantar Metatarsal head first Assessment: Performed By: Physician STONE III, HOYT E., PA-C Debridement: Debridement Pre-procedure Verification/Time Yes - 11:48 Out Taken: Start Time: 11:48 Pain Control: Other : lidocaine 4% Level: Skin/Subcutaneous Tissue Total Area Debrided (L x W): 0.2 (cm) x 0.3 (cm) = 0.06 (cm) Tissue and other material Viable, Non-Viable, Callus, Fibrin/Slough, Skin, Subcutaneous debrided: Instrument: Curette Bleeding: Minimum Hemostasis Achieved: Pressure End Time: 11:49 Procedural Pain: 0 Post Procedural Pain: 0 Response to Treatment: Procedure was tolerated well Post Debridement Measurements of Total Wound Length: (cm) 0.2 Width: (cm) 0.3 Depth: (cm) 0.4 Volume: (cm) 0.019 Character of Wound/Ulcer Post Debridement: Stable Post Procedure Diagnosis Same as Pre-procedure Electronic Signature(s) Signed: 11/10/2017 1:58:36 PM By: Renne Crigler Signed:  11/13/2017 8:04:39 AM By: Lenda Kelp PA-C Entered By: Renne Crigler on 11/10/2017 11:52:24 [name] ([id number]) -------------------------------------------------------------------------------- Debridement Details Patient Name: [name] Date of Service: 11/10/2017 11:00 AM Medical Record Number: [id number] Patient Account Number: [id number] Date of Birth/Sex: 1951/05/30 (66 y.o. Female) Treating RN: Renne Crigler Primary Care Provider: Jarome Matin Other Clinician: Referring Provider: Jarome Matin Treating Provider/Extender: STONE III, HOYT Weeks in Treatment: 15 Debridement Performed for Wound #2 Left,Plantar Metatarsal head fifth Assessment: Performed By: Physician STONE III, HOYT E., PA-C Debridement: Debridement Pre-procedure Verification/Time Yes -  11:48 Out Taken: Start Time: 11:48 Pain Control: Other : lidocaine 4% Level: Skin/Subcutaneous Tissue Total Area Debrided (L x W): 1 (cm) x 1.1 (cm) = 1.1 (cm) Tissue and other material Viable, Non-Viable, Callus, Fibrin/Slough, Skin, Subcutaneous debrided: Instrument: Curette Bleeding: Minimum Hemostasis Achieved: Pressure End Time: 11:49 Procedural Pain: 0 Post Procedural Pain: 0 Response to Treatment: Procedure was tolerated well Post Debridement Measurements of Total Wound Length: (cm) 1 Width: (cm) 1.1 Depth: (cm) 1 Volume: (cm) 0.864 Character of Wound/Ulcer Post Debridement: Stable Post Procedure Diagnosis Same as Pre-procedure Electronic Signature(s) Signed: 11/10/2017 1:58:36 PM By: Renne Crigler Signed: 11/13/2017 8:04:39 AM By: Lenda Kelp PA-C Entered By: Renne Crigler on 11/10/2017 12:02:39 [name] ([id number]) -------------------------------------------------------------------------------- HPI Details Patient Name: [name] Date of Service: 11/10/2017 11:00 AM Medical Record Number: [id number] Patient Account Number: [id number] Date  of Birth/Sex: 11-Feb-1951 (66 y.o. Female) Treating RN: Renne Crigler Primary Care Provider: Jarome Matin Other Clinician: Referring Provider: Jarome Matin Treating Provider/Extender: Linwood Dibbles, HOYT Weeks in Treatment: 15 History of Present Illness Location: if plantar foot in the region of the first metatarsal head and the fifth metatarsal head Quality: Patient reports No Pain. Severity: Patient states wound are getting worse. Duration: Patient has had the wound for > 3 months prior to seeking treatment at the wound center Context: The wound would happen gradually Modifying Factors: Other treatment(s) tried include:is been under the care of [facility] orthopedics Associated Signs and Symptoms: Patient reports having increase swelling. HPI Description: 67 year old patient referred to was from [facility] orthopedics where she was seen by Dr. Laverta Baltimore team saw her for a left foot ulcer which was there for about 4 months. She was seen in the emergency room on 06/05/2017 and they did a MRI which ruled out osteomyelitis and they placed her on antibiotics, which was Keflex for 3 weeks. She was found to have plantar ulcers in the region of the great toe and on the fifth metatarsal head. 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 on examination the patient was found to have 2 ulcers on the plantar aspect of the left forefoot the one on the fifth metatarsal head was 2-1/2 cm in diameter and down to the muscle. There was a lot of hypertrophic granulation tissue. It did not probe down to bone. Note the patient was seen in July of this year by Dr. 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 She was  recommended a hallux MP joint arthrodesis to correct the bunion and anti-tic conditions and she would weight-bear in a cam boot postoperatively. Her most recent MRI done on September 12 -- IMPRESSION: Skin wound at the fifth MTP joint without underlying abscess, septic joint or osteomyelitis.Hallux valgus and first MTP osteoarthritis. Midfoot degenerative change also noted. 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 Right toe brachial index of 0.52 and left of 0.4 suggestive of abnormal arterial flow at rest. left flow was monophasic on the dorsalis pedis and biphasic on the right side. 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 08/10/2017 -- she has upcoming appointments to see Dr. Victorino Dike and also to see the vascular surgeons at [facility]. Other than that she is doing well and being compliant with a dressing. 08/14/2017 -- she saw Dr. Victorino Dike earlier this morning and his note is pending. She has not yet had her appointment at the vascular office as yet. 08/21/2017 -- we have not yet received the consultation note from Dr. Victorino Dike and how vascular appointment is later this week. 08/28/17 on evaluation today patient appears to be doing fairly well in regard to the ulcers on her left lower extremity. She did have her arterial study with the following results. According to left arterial duplex study patient's left lower extremity arterial system is patent without evidence of hemodynamically significant stenosis. Peak systolic velocity of the bed superficial femoral artery is suggestive of a 30-49% stenosis with wall irregularity however the ratio is less than 1.5 Fortunately, she  tells me that she does have an appointment scheduled for January 8 [name] as well to further evaluate this. No fevers, chills, nausea, or vomiting noted at this time. Overall she is pleased with how things have been progressing. 09/07/2017 -- -- was seen by Dr. Durene Cal, who reviewed her vascular lab studies which showed ABI of 0.73 on the left and 1.02 on the right and monophasic waveforms throughout the left leg with no significant stenosis. Toe pressure was 50 on [place], [name]. ([id number]) the left. He has recommended proceeding with angiography and this has been scheduled for early January as per the patientos request. 10/05/17 on evaluation today patient's wound appears to be doing fairly well at this point. She has been recommended to go forward with an angiogram and that had been rescheduled but at this point in time is actually scheduled for the 15th of this month which is next week. Unfortunately this had to be pushed back. This was due to her blood thinner medication which she thought she needed to stop a different day. Nonetheless her wound does not appear to be too bad in regard to the [facility] surface of the foot is just not showing good signs of improving. Basically she is maintaining. Unfortunately she did have a new area on the right plantar surface of her foot which was incidentally noted by patient and right and suggested to [place] to look at and unfortunately she had a crack callous that revealed a small ulcer underneath. The good news is she has much better blood flow to the site. The bad news is obviously she now has an additional wound. 10/20/17 on evaluation today patient presents following her angiogram with subsequent stent placement of the left, and iliac artery. Since her procedure her blood flow does seem to be better. 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 She had no significant outflow or runoff disease bilaterally. Obviously this is good news. This was Dr. Myra Gianotti who performed the procedure and that was on 10/10/17 she does have follow-up evaluation with them for repeat ABI's and arterial studies on 11/08/17 10/27/17 on evaluation today patient appears to be doing fairly well in regard to her bilateral foot ulcers. She is definitely having some improvement in regard to all three and my opinion. With that being said this is obviously somewhat slow I do think the revascularization has made a big difference for her.  Nonetheless Orozco did have a brief discussion with her today about a total contact cast. Orozco think this would likely be the best thing to help her improve more rapidly in regard to appropriate offloading. She is somewhat willing to give this a try she believes that she could wear this without any balance issues she had worn a walking boot previously. 11/10/17 on evaluation today patient appears to be doing a little better in regard to the right foot ulcer as well is the left medial/plantar foot ulcer. With that being said the lateral foot ulcer on the left seems to be a little bit worse than that she has a significant amount of hyper granular tissue noted within the wound bed. Subsequently she has also noted more drainage from the site which Orozco think may be directly related. She has been tolerating the dressing changes which is good news up to this point. We have discussed the possibility of a total contact cast for the left lower extremity although right now with the drainage she's unsure and Orozco can to agree as to whether this would be the appropriate thing to proceed with. Electronic Signature(s) Signed: 11/13/2017 8:04:39 AM By: Lenda Kelp PA-C Entered By: Lenda Kelp on 11/10/2017 17:00:31 [name] ([id number]) -------------------------------------------------------------------------------- Physical Exam  Details Patient Name: [name] Date of Service: 11/10/2017 11:00 AM Medical Record Number: [id number] Patient Account Number: [id number] Date of Birth/Sex: Feb 13, 1951 (66 y.o. Female) Treating RN: Renne Crigler Primary Care Provider: Jarome Matin Other Clinician: Referring Provider: Jarome Matin Treating Provider/Extender: STONE III, HOYT Weeks in Treatment: 15 Constitutional Well-nourished and well-hydrated in no acute distress. Respiratory normal breathing without difficulty. Psychiatric this patient is able to make decisions and demonstrates good insight into disease process. Alert and Oriented x 3. pleasant and cooperative. Notes Orozco am going to at this point in time recommend that we proceed with debridement in regard to her ulcers. This was performed at all three sites and the right foot ulcer, left medial foot ulcer, both were performed without complication callous as well as subcutaneous tissue and Slough was removed. In regard to left lateral ulcers this was a little bit more expensive due to the fact that she had a quite significant amount of hyper granular tissue which was sharply debrided and then due to bleeding chemically cauterized with silver nitrate sticks. She tolerated this well without complication. Only minimal discomfort was noted. With that being said post debridement the area was quite a bit deeper and due to the fact that she's had more drainage Orozco did want to ensure that there is no evidence of infection we will likely order a repeat MRI her last one was actually in September 2018. Electronic Signature(s) Signed: 11/13/2017 8:04:39 AM By: Lenda Kelp PA-C Entered By: Lenda Kelp on 11/10/2017 17:01:58 [name] ([id number]) -------------------------------------------------------------------------------- Physician Orders Details Patient Name: [name] Date of Service: 11/10/2017 11:00 AM Medical Record Number:  [id number] Patient Account Number: [id number] Date of Birth/Sex: [birth year] (66 y.o. 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 o May Shower, gently pat wound dry prior to applying new dressing. Wound #2 Left,Plantar Metatarsal head fifth o Clean wound with Normal Saline. o May Shower, gently pat wound dry prior to applying new dressing. Wound #3 Right,Plantar Metatarsal head first o Clean wound with Normal Saline. o May Shower, gently pat wound dry prior to applying new dressing. Anesthetic (add to Medication List) Wound #1 Left,Plantar Metatarsal head first o Topical Lidocaine 4% cream applied to wound bed prior to debridement (In Clinic Only). Wound #2 Left,Plantar Metatarsal head fifth o Topical Lidocaine 4% cream applied to wound bed prior to debridement (In Clinic Only). Wound #3 Right,Plantar Metatarsal head first o Topical Lidocaine 4% cream applied to wound bed prior to debridement (In Clinic Only). Primary Wound Dressing Wound #1 Left,Plantar Metatarsal head first o Other: - silvercell Wound #2 Left,Plantar Metatarsal head fifth o Other: - silvercell Wound #3 Right,Plantar Metatarsal head first o Other: - silvercell Secondary Dressing Cogswell, Natalia Orozco. ([id number]) Wound #1 Left,Plantar Metatarsal head first o Gauze  and Kerlix/Conform o Gauze and Kerlix/Conform Wound #2 Left,Plantar Metatarsal head fifth o Gauze and Kerlix/Conform o Foam - for offloading Wound #3 Right,Plantar Metatarsal head first o Gauze and Kerlix/Conform o Foam - for offloading Dressing Change Frequency Wound #1 Left,Plantar Metatarsal head first o Change dressing every other day. Wound #2 Left,Plantar Metatarsal head fifth o Change dressing every other day. Wound #3 Right,Plantar Metatarsal head first o Change dressing every other day. Follow-up Appointments Wound #1 Left,Plantar Metatarsal head first o Return Appointment in 1 week. Wound #2 Left,Plantar Metatarsal head fifth o Return Appointment in 1 week. Wound #3 Right,Plantar Metatarsal head first o Return Appointment in 1 week. Edema Control Wound #1 Left,Plantar Metatarsal head first o Elevate legs to the level of the heart and pump ankles as often as possible Wound #2 Left,Plantar Metatarsal head fifth o Elevate legs to the level of the heart and pump ankles as often as possible Wound #3 Right,Plantar Metatarsal head first o Elevate legs to the level of the heart and pump ankles as often as possible Off-Loading Wound #1 Left,Plantar Metatarsal head first o Open toe surgical shoe to: - left foot, felt Wound #2 Left,Plantar Metatarsal head fifth o Open toe surgical shoe to: - left foot, felt Wound #3 Right,Plantar Metatarsal head first o Open toe surgical shoe to: - left foot, felt Additional Orders / Instructions Wound #1 Left,Plantar Metatarsal head first o Increase protein intake. [name], [name]. ([id number]) Wound #2 Left,Plantar Metatarsal head fifth o Increase protein intake. Wound #3 Right,Plantar Metatarsal head first o Increase protein intake. Radiology o Magnetic Resonance Imaging (MRI) - left foot with and without contrast Electronic Signature(s) Signed: 11/13/2017 11:15:43 AM By: Renne Crigler Signed: 11/13/2017 5:42:41 PM By: Lenda Kelp PA-C Previous Signature: 11/10/2017 1:58:36 PM Version By: Renne Crigler Previous Signature: 11/13/2017 8:04:39 AM Version By: Lenda Kelp PA-C Entered By: Renne Crigler on 11/13/2017 08:53:37 [name] ([id number]) -------------------------------------------------------------------------------- Problem List Details Patient Name: [name] Date of Service: 11/10/2017 11:00 AM Medical Record Number: [id number] Patient Account Number: [id number] Date of Birth/Sex: 12-Oct-1950 (66 y.o. 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 ([id number]) -------------------------------------------------------------------------------- Progress Note Details Patient Name: Yvette ShutterSUTPHEN, Carter Orozco. Date of Service: 11/10/2017 11:00 AM Medical Record Number: [id number] Patient Account Number: [id number] Date of Birth/Sex: [birth year] (66 y.o. Female) Treating RN: Renne CriglerFlinchum, Cheryl Primary Care Provider: Jarome MatinPATERSON, DANIEL Other Clinician: Referring Provider: Jarome MatinPATERSON, DANIEL Treating Provider/Extender: Linwood DibblesSTONE III, HOYT Weeks in Treatment:  15 Subjective Chief Complaint Information obtained from Patient Patient presents to the wound care center for a consult due non healing wound to the left plantar foot which she's had on and off for a number of years History of Present Illness (HPI) The following HPI elements were documented for the patient's wound: Location: if plantar foot in the region of the first metatarsal head and the fifth metatarsal head Quality: Patient reports No Pain. Severity: Patient states wound are getting worse. Duration: Patient has had the wound for > 3 months prior to seeking treatment at the wound center Context: The wound would happen gradually Modifying Factors: Other treatment(s) tried include:is [facility] Associated Signs and Symptoms: Patient reports having increase swelling. 67 year old patient [facility] where she was seen by Dr. Laverta BaltimoreHewitt's team saw her for a left foot ulcer which was there for about 4 months. She was seen in the emergency room on 06/05/2017 and they did a MRI which ruled out osteomyelitis and they placed her on antibiotics, which was Keflex for 3 weeks. She was found to have plantar ulcers in the region of the great toe and on the fifth metatarsal head. 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 on examination the patient was found to have 2 ulcers on the plantar aspect of the left forefoot the one on the fifth metatarsal head was 2-1/2 cm in diameter and down to the muscle. There was a lot of hypertrophic granulation tissue. It did not probe down to bone. Note the patient was seen in July of this year by Dr. Victorino DikeHewitt who reviewed her left hallux valgus and rigidus problems associated with a chronic neuropathic ulcer and considered it medically  necessary to consider surgery. She was recommended a hallux MP joint arthrodesis to correct the bunion and anti-tic conditions and she would weight-bear in a cam boot postoperatively. Her most recent MRI done on September 12 -- IMPRESSION: Skin wound at the fifth MTP joint without underlying abscess, septic joint or osteomyelitis.Hallux valgus and first MTP osteoarthritis. Midfoot degenerative change also noted. 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 Right toe brachial index of 0.52 and left of 0.4 suggestive of abnormal arterial flow at rest. left flow was monophasic on the dorsalis pedis and biphasic on the right side. 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 08/10/2017 --  she has upcoming appointments to see Dr. Victorino Dike and also to see the vascular surgeons at [facility]. Other than that she is doing well and being compliant with a dressing. 08/14/2017 -- she saw Dr. Victorino Dike earlier this morning and his note is pending. She has not yet had her appointment at the vascular office as yet. 08/21/2017 -- we have not yet received the consultation note from Dr. Victorino Dike and how vascular appointment is later this week. 08/28/17 on evaluation today patient appears to be doing fairly well in regard to the ulcers on her left lower extremity. She did have her arterial study with the following results. According to left arterial duplex study patient's left lower extremity arterial system is patent without evidence of Cadet, Moe Orozco. ([id number]) hemodynamically significant stenosis. Peak systolic velocity of the bed superficial femoral artery is suggestive of a 30-49% stenosis with  wall irregularity however the ratio is less than 1.5 Fortunately, she tells me that she does have an appointment scheduled for January 8 [name] as well to further evaluate this. No fevers, chills, nausea, or vomiting noted at this time. Overall she is pleased with how things have been progressing. 09/07/2017 -- -- was seen by Dr. Durene Cal, who reviewed her vascular lab studies which showed ABI of 0.73 on the left and 1.02 on the right and monophasic waveforms throughout the left leg with no significant stenosis. Toe pressure was 50 on the left. He has recommended proceeding with angiography and this has been scheduled for early January as per the patient s request. 10/05/17 on evaluation today patient's wound appears to be doing fairly well at this point. She has been recommended to go forward with an angiogram and that had been rescheduled but at this point in time is actually scheduled for the 15th of this month which is next week. Unfortunately this had to be pushed back. This was due to her blood thinner medication which she thought she needed to stop a different day. Nonetheless her wound does not appear to be too bad in regard to the [facility] surface of the foot is just not showing good signs of improving. Basically she is maintaining. Unfortunately she did have a new area on the right plantar surface of her foot which was incidentally noted by patient and right and suggested to [place] to look at and unfortunately she had a crack callous that revealed a small ulcer underneath. The good news is she has much better blood flow to the site. The bad news is obviously she now has an additional wound. 10/20/17 on evaluation today patient presents following her angiogram with subsequent stent placement of the left, and iliac artery. Since her procedure her blood flow does seem to be better. 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 She had no significant outflow or runoff disease bilaterally. Obviously this is good news. This was Dr. Myra Gianotti who performed the procedure and that was on 10/10/17 she does have follow-up evaluation with them for repeat ABI's and arterial studies on 11/08/17 10/27/17 on evaluation today patient appears to be doing fairly well in regard to her bilateral foot ulcers. She is definitely having some improvement in regard to all three and my opinion. With that being said this is obviously somewhat slow I do think the revascularization has made a big difference for her. Nonetheless Orozco did have a brief discussion with her today  about a total contact cast. Orozco think this would likely be the best thing to help her improve more rapidly in regard to appropriate offloading. She is somewhat willing to give this a try she believes that she could wear this without any balance issues she had worn a walking boot previously. 11/10/17 on evaluation today patient appears to be doing a little better in regard to the right foot ulcer as well is the left medial/plantar foot ulcer. With that being said the lateral foot ulcer on the left seems to be a little bit worse than that she has a significant amount of hyper granular tissue noted within the wound bed. Subsequently she has also noted more drainage from the site which Orozco think may be directly related. She has been tolerating the dressing changes which is good news up to this point. We have discussed the possibility of a total contact cast for the left lower extremity although right now with the drainage she's unsure and Orozco can to agree as to whether this would be the appropriate thing to proceed with. Patient History Information obtained from Patient. Social History Former smoker - quit 25 years ago, Marital Status - Single, Alcohol Use - Never, Drug Use - No History, Caffeine Use - Daily. Review of Systems (ROS) Constitutional  Symptoms (General Health) Denies complaints or symptoms of Fever, Chills. Respiratory The patient has no complaints or symptoms. Cardiovascular The patient has no complaints or symptoms. Psychiatric The patient has no complaints or symptoms. [name], Yvette IMarland Kitchen ([id number]) Objective Constitutional Well-nourished and well-hydrated in no acute distress. Vitals Time Taken: 11:24 AM, Height: 64 in, Weight: 262 lbs, BMI: 45, Temperature: 97.5 F, Pulse: 65 bpm, Respiratory Rate: 18 breaths/min, Blood Pressure: 120/50 mmHg. Respiratory normal breathing without difficulty. Psychiatric this patient is able to make decisions and demonstrates good insight into disease process. Alert and Oriented x 3. pleasant and cooperative. General Notes: Orozco am going to at this point in time recommend that we proceed with debridement in regard to her ulcers. This was performed at all three sites and the right foot ulcer, left medial foot ulcer, both were performed without complication callous as well as subcutaneous tissue and Slough was removed. In regard to left lateral ulcers this was a little bit more expensive due to the fact that she had a quite significant amount of hyper granular tissue which was sharply debrided and then due to bleeding chemically cauterized with silver nitrate sticks. She tolerated this well without complication. Only minimal discomfort was noted. With that being said post debridement the area was quite a bit deeper and due to the fact that she's had more drainage Orozco did want to ensure that there is no evidence of infection we will likely order a repeat MRI her last one was actually in September 2018. Integumentary (Hair, Skin) Wound #1 status is Open. Original cause of wound was Gradually Appeared. The wound is located on the Left,Plantar Metatarsal head first. The wound measures 0.2cm length x 0.3cm width x 0.3cm depth; 0.047cm^2 area and 0.014cm^3 volume. There is Fat Layer  (Subcutaneous Tissue) Exposed exposed. There is no tunneling or undermining noted. There is a large amount of serosanguineous drainage noted. The wound margin is distinct with the outline attached to the wound base. There is medium (34-66%) red granulation within the wound bed. There is a medium (34-66%) amount of necrotic tissue within the wound bed including Eschar and Adherent Slough. The periwound skin appearance exhibited: Callus. The periwound skin appearance did  not exhibit: Crepitus, Excoriation, Induration, Rash, Scarring, Dry/Scaly, Maceration, Atrophie Blanche, Cyanosis, Ecchymosis, Hemosiderin Staining, Mottled, Pallor, Rubor, Erythema. Periwound temperature was noted as No Abnormality. The periwound has tenderness on palpation. Wound #2 status is Open. Original cause of wound was Trauma. The wound is located on the Left,Plantar Metatarsal head fifth. The wound measures 1cm length x 1.1cm width x 0.3cm depth; 0.864cm^2 area and 0.259cm^3 volume. There is Fat Layer (Subcutaneous Tissue) Exposed exposed. There is no tunneling or undermining noted. There is a large amount of serosanguineous drainage noted. The wound margin is distinct with the outline attached to the wound base. There is medium (34-66%) red, hyper - granulation within the wound bed. There is a medium (34-66%) amount of necrotic tissue within the wound bed including Adherent Slough. The periwound skin appearance exhibited: Callus. The periwound skin appearance did not exhibit: Crepitus, Excoriation, Induration, Rash, Scarring, Dry/Scaly, Maceration, Atrophie Blanche, Cyanosis, Ecchymosis, Hemosiderin Staining, Mottled, Pallor, Rubor, Erythema. Periwound temperature was noted as No Abnormality. The periwound has tenderness on palpation. Wound #3 status is Open. Original cause of wound was Gradually Appeared. The wound is located on the Right,Plantar Metatarsal head first. The wound measures 0.4cm length x 0.3cm width x 0.2cm  depth; 0.094cm^2 area and 0.019cm^3 volume. There is Fat Layer (Subcutaneous Tissue) Exposed exposed. There is no tunneling or undermining noted. There is a medium amount of serous drainage noted. The wound margin is flat and intact. There is medium (34-66%) pink granulation within the wound bed. There is a medium (34-66%) amount of necrotic tissue within the wound bed including Adherent Slough. The periwound skin appearance exhibited: Callus. The periwound skin appearance did not exhibit: Crepitus, [name], [name]. ([id number]) Excoriation, Induration, Rash, Scarring, Dry/Scaly, Maceration, Atrophie Blanche, Cyanosis, Ecchymosis, Hemosiderin Staining, Mottled, Pallor, Rubor, Erythema. Periwound temperature was noted as No Abnormality. The periwound has tenderness on palpation. 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 There was a Skin/Subcutaneous Tissue Debridement ([id number]) debridement with total area of 0.06 sq cm performed by STONE III, HOYT E., PA-C. with the following instrument(s): Curette to remove Viable and Non-Viable tissue/material including Fibrin/Slough, Skin, Callus, and Subcutaneous after achieving pain control using Other (lidocaine 4%). A time out was conducted at 11:48, prior to the start of the procedure. A Minimum amount of bleeding was controlled with Pressure. The procedure was tolerated well with a pain level of 0 throughout and a pain level of 0 following the procedure. Post Debridement Measurements: 0.2cm  length x 0.3cm width x 0.4cm depth; 0.019cm^3 volume. Character of Wound/Ulcer Post Debridement is stable. 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 There was a Skin/Subcutaneous Tissue Debridement ([id number]) debridement with total area of 1.1 sq cm performed by STONE III, HOYT E., PA-C. with the following instrument(s): Curette to remove Viable and Non-Viable tissue/material including Fibrin/Slough, Skin, Callus, and Subcutaneous after achieving pain control using Other (lidocaine 4%). A time out was conducted at 11:48, prior to the start of the procedure. A Minimum amount of  bleeding was controlled with Pressure. The procedure was tolerated well with a pain level of 0 throughout and a pain level of 0 following the procedure. Post Debridement Measurements: 1cm length x 1.1cm width x 1cm depth; 0.864cm^3 volume. Character of Wound/Ulcer Post Debridement is stable. Post procedure Diagnosis Wound #2: Same as Pre-Procedure Wound #3 Pre-procedure diagnosis of Wound #3 is a Neuropathic Ulcer-Non Diabetic located on the Right,Plantar Metatarsal head first . There was a Skin/Subcutaneous Tissue Debridement ([id number]) debridement with total area of 0.12 sq cm performed by STONE III, HOYT E., PA-C. with the following instrument(s): Curette to remove Viable and Non-Viable tissue/material including Fibrin/Slough, Skin, Callus, and Subcutaneous after achieving pain control using Other (lidocaine 4%). A time out was conducted at 11:48, prior to the start of the procedure. A Minimum amount of bleeding was controlled with Pressure. The procedure was tolerated well with a pain level of 0 throughout and a pain level of 0 following the procedure. Post Debridement Measurements: 0.4cm length x 0.3cm width x 0.3cm depth; 0.028cm^3 volume. Character of Wound/Ulcer  Post Debridement is stable. [name], Yvette IMarland Kitchen ([id number]) Post procedure Diagnosis Wound #3: Same as Pre-Procedure Plan Wound Cleansing: Wound #1 Left,Plantar Metatarsal head first: Clean wound with Normal Saline. May Shower, gently pat wound dry prior to applying new dressing. Wound #2 Left,Plantar Metatarsal head fifth: Clean wound with Normal Saline. May Shower, gently pat wound dry prior to applying new dressing. Wound #3 Right,Plantar Metatarsal head first: Clean wound with Normal Saline. May Shower, gently pat wound dry prior to applying new dressing. Anesthetic (add to Medication List): Wound #1 Left,Plantar Metatarsal head first: Topical Lidocaine 4% cream applied to wound bed prior to debridement (In Clinic Only). Wound #2 Left,Plantar Metatarsal head fifth: Topical Lidocaine 4% cream applied to wound bed prior to debridement (In Clinic Only). Wound #3 Right,Plantar Metatarsal head first: Topical Lidocaine 4% cream applied to wound bed prior to debridement (In Clinic Only). Primary Wound Dressing: Wound #1 Left,Plantar Metatarsal head first: Other: - silvercell Wound #2 Left,Plantar Metatarsal head fifth: Other: - silvercell Wound #3 Right,Plantar Metatarsal head first: Other: - silvercell Secondary Dressing: Wound #1 Left,Plantar Metatarsal head first: Gauze and Kerlix/Conform Gauze and Kerlix/Conform Wound #2 Left,Plantar Metatarsal head fifth: Gauze and Kerlix/Conform Foam - for offloading Wound #3 Right,Plantar Metatarsal head first: Gauze and Kerlix/Conform Foam - for offloading Dressing Change Frequency: Wound #1 Left,Plantar Metatarsal head first: Change dressing every other day. Wound #2 Left,Plantar Metatarsal head fifth: Change dressing every other day. Wound #3 Right,Plantar Metatarsal head first: Change dressing every other day. Follow-up Appointments: Wound #1 Left,Plantar Metatarsal head first: Return Appointment in 1 week. Wound #2  Left,Plantar Metatarsal head fifth: Return Appointment in 1 week. Wound #3 Right,Plantar Metatarsal head first: Return Appointment in 1 week. Edema Control: Wound #1 Left,Plantar Metatarsal head first: [name], [name]. ([id number]) Elevate legs to the level of the heart and pump ankles as often as possible Wound #2 Left,Plantar Metatarsal head fifth: Elevate legs to the level of the heart and pump ankles as often as possible Wound #3 Right,Plantar Metatarsal head first: Elevate legs to the level of the heart and pump ankles as often as possible Off-Loading: Wound #1 Left,Plantar Metatarsal head first: Open toe surgical shoe to: - left foot, felt Wound #2 Left,Plantar Metatarsal head fifth: Open toe surgical shoe to: - left foot, felt Wound #3 Right,Plantar Metatarsal head first: Open toe surgical shoe to: - left foot, felt Additional Orders / Instructions: Wound #1 Left,Plantar Metatarsal head first: Increase  protein intake. Wound #2 Left,Plantar Metatarsal head fifth: Increase protein intake. Wound #3 Right,Plantar Metatarsal head first: Increase protein intake. Radiology ordered were: Magnetic Resonance Imaging (MRI) - left foot with and without contrast We will continue with the Current wound care measures for the next week. Orozco'm also gonna order the MRI for the left foot to see were things stand. Obviously the concern is osteomyelitis Orozco want to ensure that is not the case patient in agreement with this plan. Please see above for specific wound care orders. We will see patient for re-evaluation in 1 week(s) here in the clinic. If anything worsens or changes patient will contact our office for additional recommendations. Electronic Signature(s) Signed: 11/13/2017 5:42:41 PM By: Lenda Kelp PA-C Previous Signature: 11/13/2017 8:04:39 AM Version By: Lenda Kelp PA-C Entered By: Lenda Kelp on 11/13/2017 13:02:34 [name]  ([id number]) -------------------------------------------------------------------------------- ROS/PFSH Details Patient Name: [name] Date of Service: 11/10/2017 11:00 AM Medical Record Number: [id number] Patient Account Number: [id number] Date of Birth/Sex: 05-09-1951 (66 y.o. Female) Treating RN: Renne Crigler Primary Care Provider: Jarome Matin Other Clinician: Referring Provider: Jarome Matin Treating Provider/Extender: Linwood Dibbles, HOYT Weeks in Treatment: 15 Information Obtained From Patient Wound History Do you currently have one or more open woundso Yes How many open wounds do you currently haveo 2 Approximately how long have you had your woundso 8 weeks How have you been treating your wound(s) until nowo hydrogel, Has your wound(s) ever healed and then re-openedo No Have you had any lab work done in the past montho Yes Who ordered the lab work done[facility] Have you tested positive for an antibiotic resistant organism (MRSA, VRE)o No Have you tested positive for osteomyelitis (bone infection)o No Have you had any tests for circulation on your legso Yes Who ordered the testo hospital Where was the test done[facility] Have you had other problems associated with your woundso Swelling Constitutional Symptoms (General Health) Complaints and Symptoms: Negative for: Fever; Chills Respiratory Complaints and Symptoms: No Complaints or Symptoms Cardiovascular Complaints and Symptoms: No Complaints or Symptoms Medical History: Positive for: Arrhythmia - a fib; Hypertension Neurologic Medical History: Positive for: Neuropathy Psychiatric Complaints and Symptoms: No Complaints or Symptoms Immunizations Pneumococcal Vaccine: Received Pneumococcal Vaccination: No Hogg, Mckaylie Orozco. ([id number]) Implantable Devices Family and Social History Former smoker - quit 25 years ago; Marital Status - Single; Alcohol Use: Never; Drug Use: No History; Caffeine Use:  Daily; Financial Concerns: No; Food, Clothing or Shelter Needs: No; Support System Lacking: No; Transportation Concerns: No; Advanced Directives: No; Patient does not want information on Advanced Directives Physician Affirmation Orozco have reviewed and agree with the above information. Electronic Signature(s) Signed: 11/13/2017 8:04:39 AM By: Lenda Kelp PA-C Signed: 11/13/2017 11:15:43 AM By: Renne Crigler Entered By: Lenda Kelp on 11/10/2017 17:00:51 [name] ([id number]) -------------------------------------------------------------------------------- SuperBill Details Patient Name: [name] Date of Service: 11/10/2017 Medical Record Number: [id number] Patient Account Number: [id number] Date of Birth/Sex: 09-26-1951 (66 y.o. 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 E66.01 Morbid (severe) obesity due to excess calories Facility Procedures CPT4 Code: [id number] Description: 11042 - DEB SUBQ TISSUE  20 SQ CM/< ICD-10 Diagnosis Description L97.522 Non-pressure chronic ulcer of other part of left foot with fat L97.512 Non-pressure chronic ulcer of other part of right foot with fat Modifier: layer exposed layer exposed Quantity: 1 Physician Procedures CPT4 Code: [id number] Description: 11042 - WC PHYS SUBQ TISS 20 SQ CM ICD-10 Diagnosis Description L97.522 Non-pressure chronic ulcer of other part of left foot with fat L97.512 Non-pressure chronic ulcer of other part of right foot with fat Modifier: layer exposed  layer exposed Quantity: 1 Electronic Signature(s) Signed: 11/13/2017 8:04:39 AM By: Lenda Kelp PA-C Entered By: Lenda Kelp on 11/10/2017 17:03:07

## 2017-11-17 ENCOUNTER — Encounter: Payer: PPO | Admitting: Physician Assistant

## 2017-11-17 DIAGNOSIS — L97512 Non-pressure chronic ulcer of other part of right foot with fat layer exposed: Secondary | ICD-10-CM | POA: Diagnosis not present

## 2017-11-17 DIAGNOSIS — L97522 Non-pressure chronic ulcer of other part of left foot with fat layer exposed: Secondary | ICD-10-CM | POA: Diagnosis not present

## 2017-11-17 DIAGNOSIS — L97529 Non-pressure chronic ulcer of other part of left foot with unspecified severity: Secondary | ICD-10-CM | POA: Diagnosis not present

## 2017-11-17 DIAGNOSIS — G603 Idiopathic progressive neuropathy: Secondary | ICD-10-CM | POA: Diagnosis not present

## 2017-11-19 NOTE — Progress Notes (Signed)
[name] ([id number]) Visit Report for 11/17/2017 Chief Complaint Document Details Patient Name: Yvette, Orozco Date of Service: 11/17/2017 8:45 AM Medical Record Number: [id number] Patient Account Number: [id number] Date of Birth/Sex: [birth year] (67 y.o. Female) Treating RN: Curtis Sites Primary Care Provider: Jarome Matin Other Clinician: Referring Provider: Jarome Matin Treating Provider/Extender: Linwood Dibbles, HOYT Weeks in Treatment: 16 Information Obtained from: Patient Chief Complaint Patient presents to the wound care center for a consult due non healing wound to the left plantar foot which she's had on and off for a number of years Electronic Signature(s) Signed: 11/18/2017 12:37:14 PM By: Lenda Kelp PA-C Entered By: Lenda Kelp on 11/17/2017 09:07:10 [name] ([id number]) -------------------------------------------------------------------------------- Debridement Details Patient Name: [name] Date of Service: 11/17/2017 8:45 AM Medical Record Number: [id number] Patient Account Number: [id number] Date of Birth/Sex: 02/20/1951 (67 y.o. Female) Treating RN: Curtis Sites Primary Care Provider: Jarome Matin Other Clinician: Referring Provider: Jarome Matin Treating Provider/Extender: Linwood Dibbles, HOYT Weeks in Treatment: 16 Debridement Performed for Wound #3 Right,Plantar Metatarsal head first Assessment: Performed By: Physician STONE III, HOYT E., PA-C Debridement: Debridement Pre-procedure Verification/Time Yes - 09:39 Out Taken: Start Time: 09:39 Pain Control: Lidocaine 4% Topical Solution Level: Skin/Subcutaneous Tissue Total Area Debrided (L x W): 0.4 (cm) x 0.3 (cm) = 0.12 (cm) Tissue and other material Viable, Non-Viable, Callus, Fibrin/Slough, Subcutaneous debrided: Instrument: Curette Bleeding: Minimum Hemostasis Achieved: Pressure End Time: 09:42 Procedural Pain: 0 Post Procedural Pain:  0 Response to Treatment: Procedure was tolerated well Post Debridement Measurements of Total Wound Length: (cm) 0.4 Width: (cm) 0.4 Depth: (cm) 0.3 Volume: (cm) 0.038 Character of Wound/Ulcer Post Debridement: Improved Post Procedure Diagnosis Same as Pre-procedure Electronic Signature(s) Signed: 11/17/2017 5:21:02 PM By: Curtis Sites Signed: 11/18/2017 12:37:14 PM By: Lenda Kelp PA-C Entered By: Curtis Sites on 11/17/2017 09:43:12 [name] ([id number]) -------------------------------------------------------------------------------- Debridement Details Patient Name: [name] Date of Service: 11/17/2017 8:45 AM Medical Record Number: [id number] Patient Account Number: [id number] Date of Birth/Sex: 1951/06/17 (67 y.o. Female) Treating RN: Curtis Sites Primary Care Provider: Jarome Matin Other Clinician: Referring Provider: Jarome Matin Treating Provider/Extender: Linwood Dibbles, HOYT Weeks in Treatment: 16 Debridement Performed for Wound #1 Left,Plantar Metatarsal head first Assessment: Performed By: Physician STONE III, HOYT E., PA-C Debridement: Debridement Pre-procedure Verification/Time Yes - 09:42 Out Taken: Start Time: 09:42 Pain Control: Lidocaine 4% Topical Solution Level: Skin/Subcutaneous Tissue Total Area Debrided (L x W): 0.2 (cm) x 0.3 (cm) = 0.06 (cm) Tissue and other material Viable, Non-Viable, Callus, Fibrin/Slough, Subcutaneous debrided: Instrument: Curette Bleeding: Minimum Hemostasis Achieved: Pressure End Time: 09:46 Procedural Pain: 0 Post Procedural Pain: 0 Response to Treatment: Procedure was tolerated well Post Debridement Measurements of Total Wound Length: (cm) 0.2 Width: (cm) 0.3 Depth: (cm) 0.2 Volume: (cm) 0.009 Character of Wound/Ulcer Post Debridement: Improved Post Procedure Diagnosis Same as Pre-procedure Electronic Signature(s) Signed: 11/17/2017 5:21:02 PM By: Curtis Sites Signed:  11/18/2017 12:37:14 PM By: Lenda Kelp PA-C Entered By: Curtis Sites on 11/17/2017 09:45:20 [name] ([id number]) -------------------------------------------------------------------------------- Debridement Details Patient Name: [name] Date of Service: 11/17/2017 8:45 AM Medical Record Number: [id number] Patient Account Number: [id number] Date of Birth/Sex: 11/20/1950 (67 y.o. Female) Treating RN: Curtis Sites Primary Care Provider: Jarome Matin Other Clinician: Referring Provider: Jarome Matin Treating Provider/Extender: Linwood Dibbles, HOYT Weeks in Treatment: 16 Debridement Performed for Wound #2 Left,Plantar Metatarsal head fifth Assessment: Performed By: Physician STONE III, HOYT E., PA-C Debridement: Debridement Pre-procedure Verification/Time Yes - 09:46 Out  Taken: Start Time: 09:46 Pain Control: Lidocaine 4% Topical Solution Level: Skin/Subcutaneous Tissue Total Area Debrided (L x W): 1 (cm) x 1.1 (cm) = 1.1 (cm) Tissue and other material Viable, Non-Viable, Callus, Fibrin/Slough, Subcutaneous debrided: Instrument: Curette, Forceps, Scissors Bleeding: Minimum Hemostasis Achieved: Pressure End Time: 09:56 Procedural Pain: 0 Post Procedural Pain: 0 Response to Treatment: Procedure was tolerated well Post Debridement Measurements of Total Wound Length: (cm) 1.2 Width: (cm) 1.2 Depth: (cm) 0.4 Volume: (cm) 0.452 Character of Wound/Ulcer Post Debridement: Improved Post Procedure Diagnosis Same as Pre-procedure Electronic Signature(s) Signed: 11/17/2017 5:21:02 PM By: Curtis Sites Signed: 11/18/2017 12:37:14 PM By: Lenda Kelp PA-C Entered By: Curtis Sites on 11/17/2017 09:56:53 [name] ([id number]) -------------------------------------------------------------------------------- HPI Details Patient Name: [name] Date of Service: 11/17/2017 8:45 AM Medical Record Number: [id number] Patient Account  Number: [id number] Date of Birth/Sex: October 16, 1950 (67 y.o. Female) Treating RN: Curtis Sites Primary Care Provider: Jarome Matin Other Clinician: Referring Provider: Jarome Matin Treating Provider/Extender: Linwood Dibbles, HOYT Weeks in Treatment: 16 History of Present Illness Location: if plantar foot in the region of the first metatarsal head and the fifth metatarsal head Quality: Patient reports No Pain. Severity: Patient states wound are getting worse. Duration: Patient has had the wound for > 3 months prior to seeking treatment at the wound center Context: The wound would happen gradually Modifying Factors: Other treatment(s) tried include:is been under the care of [facility] orthopedics Associated Signs and Symptoms: Patient reports having increase swelling. HPI Description: 67 year old patient referred to was from [facility] orthopedics where she was seen by Dr. Laverta Baltimore team saw her for a left foot ulcer which was there for about 4 months. She was seen in the emergency room on 06/05/2017 and they did a MRI which ruled out osteomyelitis and they placed her on antibiotics, which was Keflex for 3 weeks. She was found to have plantar ulcers in the region of the great toe and on the fifth metatarsal head. 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 on examination the patient was found to have 2 ulcers on the plantar aspect of the left forefoot the one on the fifth metatarsal head was 2-1/2 cm in diameter and down to the muscle. There was a lot of hypertrophic granulation tissue. It did not probe down to bone. Note the patient was seen in July of this year by Dr. Victorino Dike who reviewed her left hallux valgus and rigidus problems associated with a chronic neuropathic ulcer and considered it medically necessary to consider  surgery. She was recommended a hallux MP joint arthrodesis to correct the bunion and anti-tic conditions and she would weight-bear in a cam boot postoperatively. Her most recent MRI done on September 12 -- IMPRESSION: Skin wound at the fifth MTP joint without underlying abscess, septic joint or osteomyelitis.Hallux valgus and first MTP osteoarthritis. Midfoot degenerative change also noted. 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 Right toe brachial index of 0.52 and left of 0.4 suggestive of abnormal arterial flow at rest. left flow was monophasic on the dorsalis pedis and biphasic on the right side. 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 08/10/2017 -- she has upcoming appointments to see Dr. Victorino Dike and also to see the vascular surgeons at [facility] Other than that she is doing well and being compliant with a dressing. 08/14/2017 -- she saw Dr. Victorino Dike earlier this morning and his note is pending. She has not yet had her appointment at the vascular office as yet. 08/21/2017 -- we have not yet received the consultation note from Dr. Victorino Dike and how vascular appointment is later this week. 08/28/17 on evaluation today patient appears to be doing fairly well in regard to the ulcers on her left lower extremity. She did have her arterial study with the following results. According to left arterial duplex study patient's left lower extremity arterial system is patent without evidence of hemodynamically significant stenosis. Peak systolic velocity of the bed superficial femoral artery is suggestive of a 30-49% stenosis with wall irregularity however the ratio is less than  1.5 Fortunately, she tells me that she does have an appointment scheduled for January 8 Lauren arteriogram as well to further evaluate this. No fevers, chills, nausea, or vomiting noted at this time. Overall she is pleased with how things have been progressing. 09/07/2017 -- -- was seen by Dr. Durene Cal, who reviewed her vascular lab studies which showed ABI of 0.73 on the left and 1.02 on the right and monophasic waveforms throughout the left leg with no significant stenosis. Toe pressure was 50 on Risk, Laynie I. ([id number]) the left. He has recommended proceeding with angiography and this has been scheduled for early January as per the patientos request. 10/05/17 on evaluation today patient's wound appears to be doing fairly well at this point. She has been recommended to go forward with an angiogram and that had been rescheduled but at this point in time is actually scheduled for the 15th of this month which is next week. Unfortunately this had to be pushed back. This was due to her blood thinner medication which she thought she needed to stop a different day. Nonetheless her wound does not appear to be too bad in regard to the [facility] surface of the foot is just not showing good signs of improving. Basically she is maintaining. Unfortunately she did have a new area on the right plantar surface of her foot which was incidentally noted by patient and right and suggested to [place] to look at and unfortunately she had a crack callous that revealed a small ulcer underneath. The good news is she has much better blood flow to the site. The bad news is obviously she now has an additional wound. 10/20/17 on evaluation today patient presents following her angiogram with subsequent stent placement of the left, and iliac artery. Since her procedure her blood flow does seem to be better. 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 She had no significant outflow or runoff disease bilaterally. Obviously this is good news. This was Dr. Myra Gianotti who performed the procedure and that was on 10/10/17 she does have follow-up evaluation with them for repeat ABI's and arterial studies on 11/08/17 10/27/17 on evaluation today patient appears to be doing fairly well in regard to her bilateral foot ulcers. She is definitely having some improvement in regard to all three and my opinion. With that being said this is obviously somewhat slow I do think the revascularization has made a big difference for her. Nonetheless  I did have a brief discussion with her today about a total contact cast. I think this would likely be the best thing to help her improve more rapidly in regard to appropriate offloading. She is somewhat willing to give this a try she believes that she could wear this without any balance issues she had worn a walking boot previously. 11/10/17 on evaluation today patient appears to be doing a little better in regard to the right foot ulcer as well is the left medial/plantar foot ulcer. With that being said the lateral foot ulcer on the left seems to be a little bit worse than that she has a significant amount of hyper granular tissue noted within the wound bed. Subsequently she has also noted more drainage from the site which I think may be directly related. She has been tolerating the dressing changes which is good news up to this point. We have discussed the possibility of a total contact cast for the left lower extremity although right now with the drainage she's unsure and I can to agree as to whether this would be the appropriate thing to proceed with. 11/17/17 on evaluation today patient appears to be doing about the same in regard to her ulcers. She has been tolerating the dressing changes without complication. With that being said patient tells me that she's not having any additional pain but  does continue to have a lot of drainage. No fevers, chills, nausea, or vomiting noted at this time. Otherwise she's been doing fairly well. Arlys JohnBrian has not been scheduled yet she's going to be doing this agrees for imaging but has not yet been given an actual time for the test. Electronic Signature(s) Signed: 11/18/2017 12:37:14 PM By: Lenda KelpStone III, Hoyt PA-C Entered By: Lenda KelpStone III, Hoyt on 11/17/2017 11:11:39 Yvette ShutterSUTPHEN, Oberia I. ([id number]) -------------------------------------------------------------------------------- Physical Exam Details Patient Name: Yvette ShutterSUTPHEN, Jandy I. Date of Service: 11/17/2017 8:45 AM Medical Record Number: [id number] Patient Account Number: [id number] Date of Birth/Sex: Sep 30, 1950 (66 y.o. Female) Treating RN: Curtis Sitesorthy, Joanna Primary Care Provider: Jarome MatinPATERSON, DANIEL Other Clinician: Referring Provider: Jarome MatinPATERSON, DANIEL Treating Provider/Extender: STONE III, HOYT Weeks in Treatment: 16 Constitutional Well-nourished and well-hydrated in no acute distress. Respiratory normal breathing without difficulty. clear to auscultation bilaterally. Cardiovascular regular rate and rhythm with normal S1, S2. 1+ pitting edema of the bilateral lower extremities. Psychiatric this patient is able to make decisions and demonstrates good insight into disease process. Alert and Oriented x 3. pleasant and cooperative. Notes At this point patient's wound seem to be stalled there was significant callous noted in regard to the lateral left foot wound in particular. This did require significant debridement using scissors and forceps. I did also sharply debrided utilizing a care at the right foot wound in the left plantar first metatarsal wound. We will see were things stand in one weeks time we see her for reevaluation. Electronic Signature(s) Signed: 11/18/2017 12:37:14 PM By: Lenda KelpStone III, Hoyt PA-C Entered By: Lenda KelpStone III, Hoyt on 11/17/2017 11:14:48 Yvette ShutterSUTPHEN, Marrie I.  ([id number]) -------------------------------------------------------------------------------- Physician Orders Details Patient Name: Yvette ShutterSUTPHEN, Cena I. Date of Service: 11/17/2017 8:45 AM Medical Record Number: [id number] Patient Account Number: [id number] Date of Birth/Sex: Sep 30, 1950 (66 y.o. 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 o May Shower, gently pat wound dry prior to applying new dressing. Wound #2 Left,Plantar Metatarsal head fifth o Clean wound with Normal Saline. o May Shower, gently pat wound dry prior to applying new dressing. Wound #3 Right,Plantar Metatarsal head first o Clean wound with Normal Saline. o May Shower, gently pat wound dry prior to applying new dressing. Anesthetic (add to Medication List) Wound #1 Left,Plantar Metatarsal head first o Topical Lidocaine 4% cream applied to wound bed prior to debridement (In Clinic Only). Wound #2 Left,Plantar Metatarsal head fifth o Topical Lidocaine 4% cream applied to wound bed prior to debridement (In Clinic Only). Wound #3 Right,Plantar Metatarsal head first o Topical Lidocaine 4% cream applied to wound bed prior to debridement (In Clinic Only). Primary Wound Dressing Wound #1 Left,Plantar Metatarsal head first o Other: - silvercell Wound #2 Left,Plantar Metatarsal  head fifth o Other: - silvercell Wound #3 Right,Plantar Metatarsal head first o Other: - silvercell Secondary Dressing Indelicato, Analysse I. ([id number]) Wound #1 Left,Plantar Metatarsal head first o Boardered Foam Dressing Wound #2 Left,Plantar Metatarsal head fifth o Boardered Foam Dressing Wound #3 Right,Plantar Metatarsal head first o Boardered Foam Dressing Dressing Change Frequency Wound #1 Left,Plantar Metatarsal head first o Change dressing every other day. Wound #2 Left,Plantar Metatarsal head fifth o Change dressing every other day. Wound #3 Right,Plantar Metatarsal head first o Change dressing every other day. Follow-up Appointments Wound #1 Left,Plantar Metatarsal head first o Return Appointment in 1 week. Wound #2 Left,Plantar Metatarsal head fifth o Return Appointment in 1 week. Wound #3 Right,Plantar Metatarsal head first o Return Appointment in 1 week. Edema Control Wound #1 Left,Plantar Metatarsal head first o Elevate legs to the level of the heart and pump ankles as often as possible Wound #2 Left,Plantar Metatarsal head fifth o Elevate legs to the level of the heart and pump ankles as often as possible Wound #3 Right,Plantar Metatarsal head first o Elevate legs to the level of the heart and pump ankles as often as possible Off-Loading Wound #1 Left,Plantar Metatarsal head first o Open toe surgical shoe to: - bilateral feet - darco with peg assist Additional Orders / Instructions Wound #1 Left,Plantar Metatarsal head first o Increase protein intake. Wound #2 Left,Plantar Metatarsal head fifth o Increase protein intake. Wound #3 Right,Plantar Metatarsal head first o Increase protein intake. CHERINA, DHILLON ([id number]) Electronic Signature(s) Signed: 11/17/2017 5:21:02 PM By: Curtis Sites Signed: 11/18/2017 12:37:14 PM By: Lenda Kelp PA-C Entered By: Curtis Sites on 11/17/2017 09:58:01 [name]  ([id number]) -------------------------------------------------------------------------------- Problem List Details Patient Name: [name] Date of Service: 11/17/2017 8:45 AM Medical Record Number: [id number] Patient Account Number: [id number] Date of Birth/Sex: 03/29/1951 (66 y.o. 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 Female) Treating RN: Curtis Sites Primary Care Provider: Jarome Matin Other Clinician: Referring Provider: Jarome Matin Treating Provider/Extender: Linwood Dibbles, HOYT Weeks in Treatment: 16 Subjective Chief Complaint Information obtained from Patient Patient presents to the wound care center for a consult due non healing wound to the left plantar foot which she's had on and off for a number of years History of Present Illness (HPI) The following HPI elements were  documented for the patient's wound: Location: if plantar foot in the region of the first metatarsal head and the fifth metatarsal head Quality: Patient reports No Pain. Severity: Patient states wound are getting worse. Duration: Patient has had the wound for > 3 months prior to seeking treatment at the wound center Context: The wound would happen gradually Modifying Factors: Other treatment(s) tried include:is been under the care of [facility] orthopedics Associated Signs and Symptoms: Patient reports having increase swelling. 67 year old patient referred to was from [facility] orthopedics where she was seen by Dr. Laverta Baltimore team saw her for a left foot ulcer which was there for about 4 months. She was seen in the emergency room on 06/05/2017 and they did a MRI which ruled out osteomyelitis and they placed her on antibiotics, which was Keflex for 3 weeks. She was found to have plantar ulcers in the region of the great toe and on the fifth metatarsal head. 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 on examination the patient was found to have 2 ulcers on the plantar aspect of the left forefoot the one on the fifth metatarsal head was 2-1/2 cm in diameter and down to the muscle. There was a lot of hypertrophic granulation tissue. It did not probe down to bone. Note the patient was seen in July of this year by Dr. 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 She was recommended a hallux MP joint arthrodesis to correct the bunion and anti-tic conditions and she would weight-bear in a cam boot postoperatively. Her most recent MRI done on September 12 -- IMPRESSION: Skin wound at the fifth MTP joint without underlying  abscess, septic joint or osteomyelitis.Hallux valgus and first MTP osteoarthritis. Midfoot degenerative change also noted. 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 Right toe brachial index of 0.52 and left of 0.4 suggestive of abnormal arterial flow at rest. left flow was monophasic on the dorsalis pedis and biphasic on the right side. 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 08/10/2017 -- she has upcoming appointments to see Dr. Victorino Dike and also to see the vascular surgeons at [facility]. Other than that she is doing well and being compliant with a dressing. 08/14/2017 -- she saw Dr. Victorino Dike earlier this morning and his note is pending. She has not yet had  her appointment at the vascular office as yet. 08/21/2017 -- we have not yet received the consultation note from Dr. Victorino Dike and how vascular appointment is later this week. 08/28/17 on evaluation today patient appears to be doing fairly well in regard to the ulcers on her left lower extremity. She did have her arterial study with the following results. According to left arterial duplex study patient's left lower extremity arterial system is patent without evidence of Fargnoli, Tanji I. ([id number]) hemodynamically significant stenosis. Peak systolic velocity of the bed superficial femoral artery is suggestive of a 30-49% stenosis with wall irregularity however the ratio is less than 1.5 Fortunately, she tells me that she does have an appointment scheduled for January 8 Lauren arteriogram as well to further evaluate this. No fevers, chills, nausea, or vomiting noted at this time. Overall she is pleased with how things have  been progressing. 09/07/2017 -- -- was seen by Dr. Durene Cal, who reviewed her vascular lab studies which showed ABI of 0.73 on the left and 1.02 on the right and monophasic waveforms throughout the left leg with no significant stenosis. Toe pressure was 50 on the left. He has recommended proceeding with angiography and this has been scheduled for early January as per the patient s request. 10/05/17 on evaluation today patient's wound appears to be doing fairly well at this point. She has been recommended to go forward with an angiogram and that had been rescheduled but at this point in time is actually scheduled for the 15th of this month which is next week. Unfortunately this had to be pushed back. This was due to her blood thinner medication which she thought she needed to stop a different day. Nonetheless her wound does not appear to be too bad in regard to the [facility] surface of the foot is just not showing good signs of improving. Basically she is maintaining. Unfortunately she did have a new area on the right plantar surface of her foot which was incidentally noted by patient and right and suggested to [place] to look at and unfortunately she had a crack callous that revealed a small ulcer underneath. The good news is she has much better blood flow to the site. The bad news is obviously she now has an additional wound. 10/20/17 on evaluation today patient presents following her angiogram with subsequent stent placement of the left, and iliac artery. Since her procedure her blood flow does seem to be better. 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 She had no significant outflow or runoff disease bilaterally. Obviously this is good news. This was Dr. Myra Gianotti who performed the procedure and that was on 10/10/17 she does have follow-up evaluation with them for repeat ABI's and arterial studies on  11/08/17 10/27/17 on evaluation today patient appears to be doing fairly well in regard to her bilateral foot ulcers. She is definitely having some improvement in regard to all three and my opinion. With that being said this is obviously somewhat slow I do think the revascularization has made a big difference for her. Nonetheless I did have a brief discussion with her today about a total contact cast. I think this would likely be the best thing to help her improve more rapidly in regard to appropriate offloading. She is somewhat willing to give this a try she believes that she could wear this without any balance issues she had worn a walking  boot previously. 11/10/17 on evaluation today patient appears to be doing a little better in regard to the right foot ulcer as well is the left medial/plantar foot ulcer. With that being said the lateral foot ulcer on the left seems to be a little bit worse than that she has a significant amount of hyper granular tissue noted within the wound bed. Subsequently she has also noted more drainage from the site which I think may be directly related. She has been tolerating the dressing changes which is good news up to this point. We have discussed the possibility of a total contact cast for the left lower extremity although right now with the drainage she's unsure and I can to agree as to whether this would be the appropriate thing to proceed with. 11/17/17 on evaluation today patient appears to be doing about the same in regard to her ulcers. She has been tolerating the dressing changes without complication. With that being said patient tells me that she's not having any additional pain but does continue to have a lot of drainage. No fevers, chills, nausea, or vomiting noted at this time. Otherwise she's been doing fairly well. Arlys John has not been scheduled yet she's going to be doing this agrees for imaging but has not yet been given an actual time for the test. Patient  History Information obtained from Patient. Social History Former smoker - quit 25 years ago, Marital Status - Single, Alcohol Use - Never, Drug Use - No History, Caffeine Use - Daily. Review of Systems (ROS) Constitutional Symptoms (General Health) Denies complaints or symptoms of Fever, Chills. Respiratory The patient has no complaints or symptoms. Cardiovascular KAMRYNN, MELOTT I. ([id number]) The patient has no complaints or symptoms. Psychiatric The patient has no complaints or symptoms. Objective Constitutional Well-nourished and well-hydrated in no acute distress. Vitals Time Taken: 9:00 AM, Height: 64 in, Weight: 262 lbs, BMI: 45, Temperature: 98.4 F, Pulse: 74 bpm, Respiratory Rate: 18 breaths/min, Blood Pressure: 122/61 mmHg. Respiratory normal breathing without difficulty. clear to auscultation bilaterally. Cardiovascular regular rate and rhythm with normal S1, S2. 1+ pitting edema of the bilateral lower extremities. Psychiatric this patient is able to make decisions and demonstrates good insight into disease process. Alert and Oriented x 3. pleasant and cooperative. General Notes: At this point patient's wound seem to be stalled there was significant callous noted in regard to the lateral left foot wound in particular. This did require significant debridement using scissors and forceps. I did also sharply debrided utilizing a care at the right foot wound in the left plantar first metatarsal wound. We will see were things stand in one weeks time we see her for reevaluation. Integumentary (Hair, Skin) Wound #1 status is Open. Original cause of wound was Gradually Appeared. The wound is located on the Left,Plantar Metatarsal head first. The wound measures 0.2cm length x 0.3cm width x 0.1cm depth; 0.047cm^2 area and 0.005cm^3 volume. There is Fat Layer (Subcutaneous Tissue) Exposed exposed. There is no tunneling or undermining noted. There is a large amount of  serosanguineous drainage noted. The wound margin is distinct with the outline attached to the wound base. There is medium (34-66%) red granulation within the wound bed. There is a medium (34-66%) amount of necrotic tissue within the wound bed including Eschar and Adherent Slough. The periwound skin appearance exhibited: Callus. The periwound skin appearance did not exhibit: Crepitus, Excoriation, Induration, Rash, Scarring, Dry/Scaly, Maceration, Atrophie Blanche, Cyanosis, Ecchymosis, Hemosiderin Staining, Mottled, Pallor, Rubor, Erythema. Periwound temperature was noted as  No Abnormality. The periwound has tenderness on palpation. Wound #2 status is Open. Original cause of wound was Trauma. The wound is located on the Left,Plantar Metatarsal head fifth. The wound measures 1cm length x 1.1cm width x 0.5cm depth; 0.864cm^2 area and 0.432cm^3 volume. There is Fat Layer (Subcutaneous Tissue) Exposed exposed. There is no tunneling or undermining noted. There is a large amount of serosanguineous drainage noted. The wound margin is distinct with the outline attached to the wound base. There is medium (34-66%) red, hyper - granulation within the wound bed. There is a medium (34-66%) amount of necrotic tissue within the wound bed including Adherent Slough. The periwound skin appearance exhibited: Callus. The periwound skin appearance did not exhibit: Crepitus, Excoriation, Induration, Rash, Scarring, Dry/Scaly, Maceration, Atrophie Blanche, Cyanosis, Ecchymosis, Hemosiderin Staining, Mottled, Pallor, Rubor, Erythema. Periwound temperature was noted as No Abnormality. The periwound has tenderness on palpation. Wound #3 status is Open. Original cause of wound was Gradually Appeared. The wound is located on the Right,Plantar Moline, Quinesha I. ([id number]) Metatarsal head first. The wound measures 0.4cm length x 0.3cm width x 0.2cm depth; 0.094cm^2 area and 0.019cm^3 volume. There is Fat Layer (Subcutaneous  Tissue) Exposed exposed. There is no tunneling or undermining noted. There is a medium amount of serous drainage noted. The wound margin is flat and intact. There is medium (34-66%) pink granulation within the wound bed. There is a medium (34-66%) amount of necrotic tissue within the wound bed including Adherent Slough. The periwound skin appearance exhibited: Callus. The periwound skin appearance did not exhibit: Crepitus, Excoriation, Induration, Rash, Scarring, Dry/Scaly, Maceration, Atrophie Blanche, Cyanosis, Ecchymosis, Hemosiderin Staining, Mottled, Pallor, Rubor, Erythema. Periwound temperature was noted as No Abnormality. The periwound has tenderness on palpation. 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 There was a Skin/Subcutaneous Tissue Debridement ([id number]) debridement with total area of 0.06 sq cm performed by STONE III, HOYT E., PA-C. with the following instrument(s): Curette to remove Viable and Non-Viable tissue/material including Fibrin/Slough, Callus, and Subcutaneous after achieving pain control using Lidocaine 4% Topical Solution. A time out was conducted at 09:42, prior to the start of the procedure. A Minimum amount of bleeding was controlled with Pressure. The procedure was tolerated well with a pain level of 0 throughout and a pain level of 0 following the procedure. Post Debridement Measurements: 0.2cm length x 0.3cm width x 0.2cm depth; 0.009cm^3 volume. Character of Wound/Ulcer Post Debridement is  improved. 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 There was a Skin/Subcutaneous Tissue Debridement ([id number]) debridement with total area of 1.1 sq cm performed by STONE III, HOYT E., PA-C. with the following instrument(s): Curette, Forceps, and Scissors to remove Viable and Non-Viable tissue/material including Fibrin/Slough, Callus, and Subcutaneous after achieving pain control using Lidocaine 4% Topical Solution. A time out was conducted at 09:46, prior to the start of the procedure. A Minimum amount of bleeding was controlled with Pressure. The procedure was tolerated well with a pain level of 0 throughout and a pain level  of 0 following the procedure. Post Debridement Measurements: 1.2cm length x 1.2cm width x 0.4cm depth; 0.452cm^3 volume. Character of Wound/Ulcer Post Debridement is improved. Post procedure Diagnosis Wound #2: Same as Pre-Procedure Wound #3 Pre-procedure diagnosis of Wound #3 is a Neuropathic Ulcer-Non Diabetic located on the Right,Plantar Metatarsal head first . There was a Skin/Subcutaneous Tissue Debridement ([id number]) debridement with total area of 0.12 sq cm performed by STONE III, HOYT E., PA-C. with the following instrument(s): Curette to remove Viable and Non-Viable tissue/material Burel, Florence I. ([id number]) including Fibrin/Slough, Callus, and Subcutaneous after achieving pain control using Lidocaine 4% Topical Solution. A time out was conducted at 09:39, prior to the start of the procedure. A Minimum amount of bleeding was controlled with Pressure. The procedure was tolerated well with a pain level of 0 throughout and a pain level of 0 following the procedure. Post Debridement Measurements: 0.4cm length x 0.4cm width x 0.3cm depth; 0.038cm^3 volume. Character of Wound/Ulcer Post Debridement is  improved. Post procedure Diagnosis Wound #3: Same as Pre-Procedure Plan Wound Cleansing: Wound #1 Left,Plantar Metatarsal head first: Clean wound with Normal Saline. May Shower, gently pat wound dry prior to applying new dressing. Wound #2 Left,Plantar Metatarsal head fifth: Clean wound with Normal Saline. May Shower, gently pat wound dry prior to applying new dressing. Wound #3 Right,Plantar Metatarsal head first: Clean wound with Normal Saline. May Shower, gently pat wound dry prior to applying new dressing. Anesthetic (add to Medication List): Wound #1 Left,Plantar Metatarsal head first: Topical Lidocaine 4% cream applied to wound bed prior to debridement (In Clinic Only). Wound #2 Left,Plantar Metatarsal head fifth: Topical Lidocaine 4% cream applied to wound bed prior to debridement (In Clinic Only). Wound #3 Right,Plantar Metatarsal head first: Topical Lidocaine 4% cream applied to wound bed prior to debridement (In Clinic Only). Primary Wound Dressing: Wound #1 Left,Plantar Metatarsal head first: Other: - silvercell Wound #2 Left,Plantar Metatarsal head fifth: Other: - silvercell Wound #3 Right,Plantar Metatarsal head first: Other: - silvercell Secondary Dressing: Wound #1 Left,Plantar Metatarsal head first: Boardered Foam Dressing Wound #2 Left,Plantar Metatarsal head fifth: Boardered Foam Dressing Wound #3 Right,Plantar Metatarsal head first: Boardered Foam Dressing Dressing Change Frequency: Wound #1 Left,Plantar Metatarsal head first: Change dressing every other day. Wound #2 Left,Plantar Metatarsal head fifth: Change dressing every other day. Wound #3 Right,Plantar Metatarsal head first: Change dressing every other day. Follow-up Appointments: Wound #1 Left,Plantar Metatarsal head first: Return Appointment in 1 week. Wound #2 Left,Plantar Metatarsal head fifth: Return Appointment in 1 week. Wound #3 Right,Plantar Metatarsal head first: Return Appointment in  1 week. VESPER, TRANT I. ([id number]) Edema Control: Wound #1 Left,Plantar Metatarsal head first: Elevate legs to the level of the heart and pump ankles as often as possible Wound #2 Left,Plantar Metatarsal head fifth: Elevate legs to the level of the heart and pump ankles as often as possible Wound #3 Right,Plantar Metatarsal head first: Elevate legs to the level of the heart and pump ankles as often as possible Off-Loading: Wound #1 Left,Plantar Metatarsal head first: Open toe surgical shoe to: - bilateral feet - darco with peg assist Additional Orders / Instructions: Wound #1 Left,Plantar Metatarsal head first: Increase protein intake. Wound #2 Left,Plantar Metatarsal head fifth: Increase protein intake. Wound #3 Right,Plantar Metatarsal head first: Increase protein intake. Currently I do still think a total contact cast for the left foot may be appropriate but she still having a tremendous amount of drainage at this point. Therefore I'm going to hold off on that currently and  we will utilize the peg assist shoe in both feet to see how this time. Patient is in agreement with this plan. If anything worsens in the next week she will let me know otherwise I'm hopeful this will make a difference as far as the callous buildup and hopefully overall improvement in healing in regard to her ulcers. Please see above for specific wound care orders. We will see patient for re-evaluation in 1 week(s) here in the clinic. If anything worsens or changes patient will contact our office for additional recommendations. Electronic Signature(s) Signed: 11/18/2017 12:37:14 PM By: Lenda Kelp PA-C Entered By: Lenda Kelp on 11/17/2017 11:18:21 [name] ([id number]) -------------------------------------------------------------------------------- ROS/PFSH Details Patient Name: [name] Date of Service: 11/17/2017 8:45 AM Medical Record Number: [id number] Patient Account  Number: [id number] Date of Birth/Sex: 03-Mar-1951 (66 y.o. Female) Treating RN: Curtis Sites Primary Care Provider: Jarome Matin Other Clinician: Referring Provider: Jarome Matin Treating Provider/Extender: Linwood Dibbles, HOYT Weeks in Treatment: 16 Information Obtained From Patient Wound History Do you currently have one or more open woundso Yes How many open wounds do you currently haveo 2 Approximately how long have you had your woundso 8 weeks How have you been treating your wound(s) until nowo hydrogel, Has your wound(s) ever healed and then re-openedo No Have you had any lab work done in the past montho Yes Who ordered the lab work doneo [facility] Have you tested positive for an antibiotic resistant organism (MRSA, VRE)o No Have you tested positive for osteomyelitis (bone infection)o No Have you had any tests for circulation on your legso Yes Who ordered the testo hospital Where was the test done[facility] Have you had other problems associated with your woundso Swelling Constitutional Symptoms (General Health) Complaints and Symptoms: Negative for: Fever; Chills Respiratory Complaints and Symptoms: No Complaints or Symptoms Cardiovascular Complaints and Symptoms: No Complaints or Symptoms Medical History: Positive for: Arrhythmia - a fib; Hypertension Neurologic Medical History: Positive for: Neuropathy Psychiatric Complaints and Symptoms: No Complaints or Symptoms Immunizations Pneumococcal Vaccine: Received Pneumococcal Vaccination: No Derryberry, Valina I. ([id number]) Implantable Devices Family and Social History Former smoker - quit 25 years ago; Marital Status - Single; Alcohol Use: Never; Drug Use: No History; Caffeine Use: Daily; Financial Concerns: No; Food, Clothing or Shelter Needs: No; Support System Lacking: No; Transportation Concerns: No; Advanced Directives: No; Patient does not want information on Advanced Directives Physician Affirmation I have  reviewed and agree with the above information. Electronic Signature(s) Signed: 11/17/2017 5:21:02 PM By: Curtis Sites Signed: 11/18/2017 12:37:14 PM By: Lenda Kelp PA-C Entered By: Lenda Kelp on 11/17/2017 11:16:02 [name] ([id number]) -------------------------------------------------------------------------------- SuperBill Details Patient Name: [name] Date of Service: 11/17/2017 Medical Record Number: [id number] Patient Account Number: [id number] Date of Birth/Sex: Aug 12, 1951 (66 y.o. 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 E66.01 Morbid (severe) obesity due to excess calories Facility Procedures CPT4 Code: [id number] Description: 11042 - DEB SUBQ TISSUE 20 SQ CM/< ICD-10 Diagnosis Description L97.522 Non-pressure chronic ulcer of other part of left foot with fat L97.512 Non-pressure chronic ulcer of other part of right foot with fat Modifier: layer exposed layer  exposed Quantity: 1 Physician Procedures CPT4 Code: [id number] Description: 11042 - WC PHYS SUBQ TISS 20 SQ CM ICD-10 Diagnosis Description L97.522 Non-pressure chronic ulcer of other part of left foot with fat L97.512 Non-pressure chronic ulcer of other part of right foot with fat Modifier: layer exposed layer exposed Quantity: 1 Electronic Signature(s) Signed: 11/18/2017 12:37:14 PM By: Lenda Kelp PA-C Entered By: Lenda Kelp on 11/17/2017 11:18:39

## 2017-11-21 ENCOUNTER — Encounter (HOSPITAL_COMMUNITY): Payer: Self-pay

## 2017-11-21 ENCOUNTER — Ambulatory Visit (HOSPITAL_COMMUNITY): Payer: PPO

## 2017-11-22 ENCOUNTER — Ambulatory Visit: Payer: PPO | Admitting: Nurse Practitioner

## 2017-11-23 NOTE — Progress Notes (Signed)
[name] ([id number]) Visit Report for 11/17/2017 Arrival Information Details Patient Name: [name] Date of Service: 11/17/2017 8:45 AM Medical Record Number: [id number] Patient Account Number: [id number] Date of Birth/Sex: [birth year] (67 y.o. Female) Treating RN: Renne Crigler Primary Care Albirta Rhinehart: Jarome Matin Other Clinician: Referring Lexie Koehl: Jarome Matin Treating Merriam Brandner/Extender: Linwood Dibbles, HOYT Weeks in Treatment: 16 Visit Information History Since Last Visit All ordered tests and consults were completed: No Patient Arrived: Cane Added or deleted any medications: No Arrival Time: 09:06 Any new allergies or adverse reactions: No Accompanied By: self Had a fall or experienced change in No Transfer Assistance: None activities of daily living that may affect Patient Identification Verified: Yes risk of falls: Secondary Verification Process Yes Signs or symptoms of abuse/neglect since last visito No Completed: Hospitalized since last visit: No Patient Has Alerts: Yes Pain Present Now: No Patient Alerts: Patient on Blood Thinner Eliquis R ABI 0.98 11/08/17 L ABI 1.05 11/08/17 R TBI 0.73 11/08/17 L TBI 0.80 11/08/17 Electronic Signature(s) Signed: 11/17/2017 4:58:29 PM By: Renne Crigler Entered By: Renne Crigler on 11/17/2017 09:06:54 [name] ([id number]) -------------------------------------------------------------------------------- Encounter Discharge Information Details Patient Name: [name] Date of Service: 11/17/2017 8:45 AM Medical Record Number: [id number] Patient Account Number: [id number] Date of Birth/Sex: 06-Jul-1951 (67 y.o. Female) Treating RN: Ashok Cordia, Debi Primary Care Holt Woolbright: Jarome Matin Other Clinician: Referring Kyira Volkert: Jarome Matin Treating Adelard Sanon/Extender: Linwood Dibbles, HOYT Weeks in Treatment: 16 Encounter Discharge Information Items Discharge Pain Level: 0 Discharge  Condition: Stable Ambulatory Status: Cane Discharge Destination: Home Private Transportation: Auto Accompanied By: self Schedule Follow-up Appointment: Yes Medication Reconciliation completed and provided No to Patient/Care Jaleeya Mcnelly: Clinical Summary of Care: Electronic Signature(s) Signed: 11/22/2017 4:30:35 PM By: Alejandro Mulling Entered By: Alejandro Mulling on 11/17/2017 10:14:10 [name] ([id number]) -------------------------------------------------------------------------------- Lower Extremity Assessment Details Patient Name: [name] Date of Service: 11/17/2017 8:45 AM Medical Record Number: [id number] Patient Account Number: [id number] Date of Birth/Sex: [birth year] (67 y.o. Female) Treating RN: Renne Crigler Primary Care Whitt Auletta: Jarome Matin Other Clinician: Referring Donnesha Karg: Jarome Matin Treating Ginger Leeth/Extender: Linwood Dibbles, HOYT Weeks in Treatment: 16 Edema Assessment Assessed: [Left: No] [Right: No] Edema: [Left: Yes] [Right: Yes] Vascular Assessment Claudication: Claudication Assessment [Left:None] [Right:None] Pulses: Dorsalis Pedis Palpable: [Left:Yes] [Right:Yes] Posterior Tibial Extremity colors, hair growth, and conditions: Extremity Color: [Left:Mottled] [Right:Normal] Hair Growth on Extremity: [Left:No] [Right:No] Temperature of Extremity: [Left:Cool] [Right:Cool] Toe Nail Assessment Left: Right: Thick: No No Discolored: No No Deformed: No No Improper Length and Hygiene: No No Electronic Signature(s) Signed: 11/17/2017 4:58:29 PM By: Renne Crigler Entered By: Renne Crigler on 11/17/2017 09:24:22 [name] ([id number]) -------------------------------------------------------------------------------- Multi Wound Chart Details Patient Name: [name] Date of Service: 11/17/2017 8:45 AM Medical Record Number: [id number] Patient Account Number: [id number] Date of Birth/Sex: 01-22-1951 (67 y.o.  Female) Treating RN: Curtis Sites Primary Care Kirk Sampley: Jarome Matin Other Clinician: Referring Jenness Stemler: Jarome Matin Treating Noah Lembke/Extender: Linwood Dibbles, HOYT Weeks in Treatment: 16 Vital Signs Height(in): 64 Pulse(bpm): 74 Weight(lbs): 262 Blood Pressure(mmHg): 122/61 Body Mass Index(BMI): 45 Temperature(F): 98.4 Respiratory Rate 18 (breaths/min): Photos: [1:No Photos] [2:No Photos] [3:No Photos] Wound Location: [1:Left Metatarsal head first - Plantar] [2:Left Metatarsal head fifth - Plantar] [3:Right Metatarsal head first - Plantar] Wounding Event: [1:Gradually Appeared] [2:Trauma] [3:Gradually Appeared] 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] [2:Neuropathic Ulcer-Non Diabetic] [3:Neuropathic Ulcer-Non Diabetic] Comorbid History: [1:Arrhythmia, Hypertension, Neuropathy] [2:Arrhythmia, Hypertension, Neuropathy] [3:Arrhythmia, Hypertension, Neuropathy] Date Acquired: [1:01/22/2017] [2:05/27/2017] [3:10/05/2017] Weeks of Treatment: [1:16] [2:16] [3:6] Wound Status: [1:Open] [2:Open] [3:Open]  Measurements L x W x D [1:0.2x0.3x0.1] [2:1x1.1x0.5] [3:0.4x0.3x0.2] (cm) Area (cm) : [1:0.047] [2:0.864] [3:0.094] Volume (cm) : [1:0.005] [2:0.432] [3:0.019] % Reduction in Area: [1:62.70%] [2:15.40%] [3:-49.20%] % Reduction in Volume: [1:92.10%] [2:-323.50%] [3:-46.20%] Classification: [1:Full Thickness With Exposed Support Structures] [2:Full Thickness With Exposed Support Structures] [3:Full Thickness With Exposed Support Structures] Exudate Amount: [1:Large] [2:Large] [3:Medium] Exudate Type: [1:Serosanguineous] [2:Serosanguineous] [3:Serous] Exudate Color: [1:red, brown] [2:red, brown] [3:amber] Wound Margin: [1:Distinct, outline attached] [2:Distinct, outline attached] [3:Flat and Intact] Granulation Amount: [1:Medium (34-66%)] [2:Medium (34-66%)] [3:Medium (34-66%)] Granulation Quality: [1:Red] [2:Red, Hyper-granulation] [3:Pink] Necrotic Amount:  [1:Medium (34-66%)] [2:Medium (34-66%)] [3:Medium (34-66%)] Necrotic Tissue: [1:Eschar, Adherent Slough] [2:Adherent Slough] [3:Adherent Slough] Exposed Structures: [1:Fat Layer (Subcutaneous Tissue) Exposed: Yes Fascia: No Tendon: No Muscle: No Joint: No Bone: No] [2:Fat Layer (Subcutaneous Tissue) Exposed: Yes Fascia: No Tendon: No Muscle: No Joint: No Bone: No] [3:Fat Layer (Subcutaneous Tissue) Exposed: Yes  Fascia: No Tendon: No Muscle: No Joint: No Bone: No] Epithelialization: [1:None] [2:Small (1-33%)] [3:None] Periwound Skin Texture: [1:Callus: Yes Excoriation: No] [2:Callus: Yes Excoriation: No] [3:Callus: Yes Excoriation: No] Induration: No Induration: No Induration: No Crepitus: No Crepitus: No Crepitus: No Rash: No Rash: No Rash: No Scarring: No Scarring: No Scarring: No Periwound Skin Moisture: Maceration: No Maceration: No Maceration: No Dry/Scaly: No Dry/Scaly: No Dry/Scaly: No Periwound Skin Color: Atrophie Blanche: No Atrophie Blanche: No Atrophie Blanche: No Cyanosis: No Cyanosis: No Cyanosis: No Ecchymosis: No Ecchymosis: No Ecchymosis: No Erythema: No Erythema: No Erythema: No Hemosiderin Staining: No Hemosiderin Staining: No Hemosiderin Staining: No Mottled: No Mottled: No Mottled: No Pallor: No Pallor: No Pallor: No Rubor: No Rubor: No Rubor: No Temperature: No Abnormality No Abnormality No Abnormality Tenderness on Palpation: Yes Yes Yes Wound Preparation: Ulcer Cleansing: Ulcer Cleansing: Ulcer Cleansing: Rinsed/Irrigated with Saline Rinsed/Irrigated with Saline Rinsed/Irrigated with Saline Topical Anesthetic Applied: Topical Anesthetic Applied: Topical Anesthetic Applied: Other: lidocaine 4% Other: lidocaine 4% Other: lidocaine 4% Treatment Notes Electronic Signature(s) Signed: 11/17/2017 5:21:02 PM By: Curtis Sites Entered By: Curtis Sites on 11/17/2017 09:38:14 [name]  ([id number]) -------------------------------------------------------------------------------- Multi-Disciplinary Care Plan Details Patient Name: [name] Date of Service: 11/17/2017 8:45 AM Medical Record Number: [id number] Patient Account Number: [id number] Date of Birth/Sex: 1950-09-27 (66 y.o. Female) Treating RN: Curtis Sites Primary Care Tayona Sarnowski: Jarome Matin Other Clinician: Referring Asaiah Hunnicutt: Jarome Matin Treating Denni France/Extender: Linwood Dibbles, HOYT Weeks in Treatment: 16 Active Inactive ` Abuse / Safety / Falls / Self Care Management Nursing Diagnoses: Potential for falls Goals: Patient will not experience any injury related to falls Date Initiated: 07/24/2017 Target Resolution Date: 09/30/2017 Goal Status: Active Interventions: Assess fall risk on admission and as needed Notes: ` Orientation to the Wound Care Program Nursing Diagnoses: Knowledge deficit related to the wound healing center program Goals: Patient/caregiver will verbalize understanding of the Wound Healing Center Program Date Initiated: 07/24/2017 Target Resolution Date: 09/30/2017 Goal Status: Active Interventions: Provide education on orientation to the wound center Notes: ` Wound/Skin Impairment Nursing Diagnoses: Impaired tissue integrity Goals: Ulcer/skin breakdown will heal within 14 weeks Date Initiated: 07/24/2017 Target Resolution Date: 09/30/2017 Goal Status: Active Interventions: JOLANE, BANKHEAD IMarland Kitchen ([id number]) Assess patient/caregiver ability to obtain necessary supplies Assess patient/caregiver ability to perform ulcer/skin care regimen upon admission and as needed Assess ulceration(s) every visit Notes: Electronic Signature(s) Signed: 11/17/2017 5:21:02 PM By: Curtis Sites Entered By: Curtis Sites on 11/17/2017 09:38:02 [name] ([id number]) -------------------------------------------------------------------------------- Pain Assessment  Details Patient Name: [name] Date of Service: 11/17/2017 8:45 AM Medical Record Number: [id number]  Patient Account Number: [id number] Date of Birth/Sex: 1951-09-16 (66 y.o. Female) Treating RN: Renne CriglerFlinchum, Cheryl Primary Care Mohamud Mrozek: Jarome MatinPATERSON, DANIEL Other Clinician: Referring Ranson Belluomini: Jarome MatinPATERSON, DANIEL Treating Ismahan Lippman/Extender: Linwood DibblesSTONE III, HOYT Weeks in Treatment: 16 Active Problems Location of Pain Severity and Description of Pain Patient Has Paino No Site Locations Pain Management and Medication Current Pain Management: Electronic Signature(s) Signed: 11/17/2017 4:58:29 PM By: Renne CriglerFlinchum, Cheryl Entered By: Renne CriglerFlinchum, Cheryl on 11/17/2017 09:07:02 [name], Yvette I. ([id number]) -------------------------------------------------------------------------------- Patient/Caregiver Education Details Patient Name: [name], Yvette I. Date of Service: 11/17/2017 8:45 AM Medical Record Number: [id number] Patient Account Number: [id number] Date of Birth/Gender: 1951-09-16 (66 y.o. Female) Treating RN: Ashok CordiaPinkerton, Debi Primary Care Physician: Jarome MatinPATERSON, DANIEL Other Clinician: Referring Physician: Jarome MatinPATERSON, DANIEL Treating Physician/Extender: Skeet SimmerSTONE III, HOYT Weeks in Treatment: 16 Education Assessment Education Provided To: Patient Education Topics Provided Wound/Skin Impairment: Handouts: Caring for Your Ulcer, Other: change dressing as ordered Methods: Demonstration, Explain/Verbal Responses: State content correctly Electronic Signature(s) Signed: 11/22/2017 4:30:35 PM By: Alejandro MullingPinkerton, Debra Entered By: Alejandro MullingPinkerton, Debra on 11/17/2017 10:03:11 [name], Aleayah I. ([id number]) -------------------------------------------------------------------------------- Wound Assessment Details Patient Name: [name], Yvette I. Date of Service: 11/17/2017 8:45 AM Medical Record Number: [id number] Patient Account Number: [id number] Date of Birth/Sex: 1951-09-16 (66 y.o. Female) Treating RN:  Renne CriglerFlinchum, Cheryl Primary Care Cerina Leary: Jarome MatinPATERSON, DANIEL Other Clinician: Referring Shanika Levings: Jarome MatinPATERSON, DANIEL Treating Tiarrah Saville/Extender: STONE III, HOYT Weeks in Treatment: 16 Wound Status Wound Number: 1 Primary Etiology: Neuropathic Ulcer-Non Diabetic Wound Location: Left Metatarsal head first - Plantar Wound Status: Open Wounding Event: Gradually Appeared Comorbid History: Arrhythmia, Hypertension, Neuropathy Date Acquired: 01/22/2017 Weeks Of Treatment: 16 Clustered Wound: No Wound Measurements Length: (cm) 0.2 Width: (cm) 0.3 Depth: (cm) 0.1 Area: (cm) 0.047 Volume: (cm) 0.005 % Reduction in Area: 62.7% % Reduction in Volume: 92.1% Epithelialization: None Tunneling: No Undermining: No Wound Description Full Thickness With Exposed Support Classification: Structures Wound Margin: Distinct, outline attached Exudate Large Amount: Exudate Type: Serosanguineous Exudate Color: red, brown Foul Odor After Cleansing: No Slough/Fibrino Yes Wound Bed Granulation Amount: Medium (34-66%) Exposed Structure Granulation Quality: Red Fascia Exposed: No Necrotic Amount: Medium (34-66%) Fat Layer (Subcutaneous Tissue) Exposed: Yes Necrotic Quality: Eschar, Adherent Slough Tendon Exposed: No Muscle Exposed: No Joint Exposed: No Bone Exposed: No Periwound Skin Texture Texture Color No Abnormalities Noted: No No Abnormalities Noted: No Callus: Yes Atrophie Blanche: No Crepitus: No Cyanosis: No Excoriation: No Ecchymosis: No Induration: No Erythema: No Rash: No Hemosiderin Staining: No Scarring: No Mottled: No Pallor: No Moisture Rubor: No No Abnormalities Noted: No Dry / Scaly: No Temperature / Pain Lague, Samanatha I. ([id number]) Maceration: No Temperature: No Abnormality Tenderness on Palpation: Yes Wound Preparation Ulcer Cleansing: Rinsed/Irrigated with Saline Topical Anesthetic Applied: Other: lidocaine 4%, Treatment Notes Wound #1 (Left, Plantar  Metatarsal head first) 1. Cleansed with: Clean wound with Normal Saline 2. Anesthetic Topical Lidocaine 4% cream to wound bed prior to debridement 4. Dressing Applied: Other dressing (specify in notes) 5. Secondary Dressing Applied Bordered Foam Dressing Notes peg assist darco Electronic Signature(s) Signed: 11/17/2017 4:58:29 PM By: Renne CriglerFlinchum, Cheryl Entered By: Renne CriglerFlinchum, Cheryl on 11/17/2017 09:21:31 [name], Yvette I. ([id number]) -------------------------------------------------------------------------------- Wound Assessment Details Patient Name: [name], Yvette I. Date of Service: 11/17/2017 8:45 AM Medical Record Number: [id number] Patient Account Number: [id number] Date of Birth/Sex: 1951-09-16 (66 y.o. Female) Treating RN: Renne CriglerFlinchum, Cheryl Primary Care Kamilo Och: Jarome MatinPATERSON, DANIEL Other Clinician: Referring Arthella Headings: Jarome MatinPATERSON, DANIEL Treating Diontae Route/Extender: STONE III, HOYT Weeks in Treatment: 16 Wound Status Wound Number: 2 Primary Etiology: Neuropathic Ulcer-Non Diabetic Wound Location: Left Metatarsal head fifth -  Plantar Wound Status: Open Wounding Event: Trauma Comorbid History: Arrhythmia, Hypertension, Neuropathy Date Acquired: 05/27/2017 Weeks Of Treatment: 16 Clustered Wound: No Wound Measurements Length: (cm) 1 Width: (cm) 1.1 Depth: (cm) 0.5 Area: (cm) 0.864 Volume: (cm) 0.432 % Reduction in Area: 15.4% % Reduction in Volume: -323.5% Epithelialization: Small (1-33%) Tunneling: No Undermining: No Wound Description Full Thickness With Exposed Support Classification: Structures Wound Margin: Distinct, outline attached Exudate Large Amount: Exudate Type: Serosanguineous Exudate Color: red, brown Foul Odor After Cleansing: No Slough/Fibrino Yes Wound Bed Granulation Amount: Medium (34-66%) Exposed Structure Granulation Quality: Red, Hyper-granulation Fascia Exposed: No Necrotic Amount: Medium (34-66%) Fat Layer (Subcutaneous Tissue)  Exposed: Yes Necrotic Quality: Adherent Slough Tendon Exposed: No Muscle Exposed: No Joint Exposed: No Bone Exposed: No Periwound Skin Texture Texture Color No Abnormalities Noted: No No Abnormalities Noted: No Callus: Yes Atrophie Blanche: No Crepitus: No Cyanosis: No Excoriation: No Ecchymosis: No Induration: No Erythema: No Rash: No Hemosiderin Staining: No Scarring: No Mottled: No Pallor: No Moisture Rubor: No No Abnormalities Noted: No Dry / Scaly: No Temperature / Pain Portner, [name] I. ([id number]) Maceration: No Temperature: No Abnormality Tenderness on Palpation: Yes Wound Preparation Ulcer Cleansing: Rinsed/Irrigated with Saline Topical Anesthetic Applied: Other: lidocaine 4%, Treatment Notes Wound #2 (Left, Plantar Metatarsal head fifth) 1. Cleansed with: Clean wound with Normal Saline 2. Anesthetic Topical Lidocaine 4% cream to wound bed prior to debridement 4. Dressing Applied: Other dressing (specify in notes) 5. Secondary Dressing Applied Bordered Foam Dressing Notes peg assist darco Electronic Signature(s) Signed: 11/17/2017 4:58:29 PM By: Renne Crigler Entered By: Renne Crigler on 11/17/2017 09:22:19 [name] ([id number]) -------------------------------------------------------------------------------- Wound Assessment Details Patient Name: [name] Date of Service: 11/17/2017 8:45 AM Medical Record Number: [id number] Patient Account Number: [id number] Date of Birth/Sex: 09-18-1951 (66 y.o. Female) Treating RN: Renne Crigler Primary Care Dewitte Vannice: Jarome Matin Other Clinician: Referring Jazion Atteberry: Jarome Matin Treating Ceairra Mccarver/Extender: STONE III, HOYT Weeks in Treatment: 16 Wound Status Wound Number: 3 Primary Etiology: Neuropathic Ulcer-Non Diabetic Wound Location: Right Metatarsal head first - Plantar Wound Status: Open Wounding Event: Gradually Appeared Comorbid History: Arrhythmia,  Hypertension, Neuropathy Date Acquired: 10/05/2017 Weeks Of Treatment: 6 Clustered Wound: No Wound Measurements Length: (cm) 0.4 Width: (cm) 0.3 Depth: (cm) 0.2 Area: (cm) 0.094 Volume: (cm) 0.019 % Reduction in Area: -49.2% % Reduction in Volume: -46.2% Epithelialization: None Tunneling: No Undermining: No Wound Description Full Thickness With Exposed Support Classification: Structures Wound Margin: Flat and Intact Exudate Medium Amount: Exudate Type: Serous Exudate Color: amber Foul Odor After Cleansing: No Slough/Fibrino No Wound Bed Granulation Amount: Medium (34-66%) Exposed Structure Granulation Quality: Pink Fascia Exposed: No Necrotic Amount: Medium (34-66%) Fat Layer (Subcutaneous Tissue) Exposed: Yes Necrotic Quality: Adherent Slough Tendon Exposed: No Muscle Exposed: No Joint Exposed: No Bone Exposed: No Periwound Skin Texture Texture Color No Abnormalities Noted: No No Abnormalities Noted: No Callus: Yes Atrophie Blanche: No Crepitus: No Cyanosis: No Excoriation: No Ecchymosis: No Induration: No Erythema: No Rash: No Hemosiderin Staining: No Scarring: No Mottled: No Pallor: No Moisture Rubor: No No Abnormalities Noted: No Dry / Scaly: No Temperature / Pain Cantin, Chastin I. ([id number]) Maceration: No Temperature: No Abnormality Tenderness on Palpation: Yes Wound Preparation Ulcer Cleansing: Rinsed/Irrigated with Saline Topical Anesthetic Applied: Other: lidocaine 4%, Treatment Notes Wound #3 (Right, Plantar Metatarsal head first) 1. Cleansed with: Clean wound with Normal Saline 2. Anesthetic Topical Lidocaine 4% cream to wound bed prior to debridement 4. Dressing Applied: Other dressing (specify in notes) 5. Secondary Dressing Applied Bordered Foam  Dressing Notes peg assist darco Electronic Signature(s) Signed: 11/17/2017 4:58:29 PM By: Renne Crigler Entered By: Renne Crigler on 11/17/2017 09:22:38 [name] ([id number]) -------------------------------------------------------------------------------- Vitals Details Patient Name: [name] Date of Service: 11/17/2017 8:45 AM Medical Record Number: [id number] Patient Account Number: [id number] Date of Birth/Sex: 04/16/1951 (66 y.o. Female) Treating RN: Renne Crigler Primary Care Treshaun Carrico: Jarome Matin Other Clinician: Referring Ivylynn Hoppes: Jarome Matin Treating Daphnee Preiss/Extender: STONE III, HOYT Weeks in Treatment: 16 Vital Signs Time Taken: 09:00 Temperature (F): 98.4 Height (in): 64 Pulse (bpm): 74 Weight (lbs): 262 Respiratory Rate (breaths/min): 18 Body Mass Index (BMI): 45 Blood Pressure (mmHg): 122/61 Reference Range: 80 - 120 mg / dl Electronic Signature(s) Signed: 11/17/2017 4:58:29 PM By: Renne Crigler Entered By: Renne Crigler on 11/17/2017 09:10:03

## 2017-11-24 ENCOUNTER — Other Ambulatory Visit: Payer: Self-pay | Admitting: Physician Assistant

## 2017-11-24 ENCOUNTER — Encounter: Payer: PPO | Attending: Physician Assistant | Admitting: Physician Assistant

## 2017-11-24 DIAGNOSIS — I10 Essential (primary) hypertension: Secondary | ICD-10-CM | POA: Insufficient documentation

## 2017-11-24 DIAGNOSIS — L97522 Non-pressure chronic ulcer of other part of left foot with fat layer exposed: Secondary | ICD-10-CM

## 2017-11-24 DIAGNOSIS — Z903 Acquired absence of stomach [part of]: Secondary | ICD-10-CM | POA: Insufficient documentation

## 2017-11-24 DIAGNOSIS — Z888 Allergy status to other drugs, medicaments and biological substances status: Secondary | ICD-10-CM | POA: Insufficient documentation

## 2017-11-24 DIAGNOSIS — I739 Peripheral vascular disease, unspecified: Secondary | ICD-10-CM | POA: Diagnosis not present

## 2017-11-24 DIAGNOSIS — R0600 Dyspnea, unspecified: Secondary | ICD-10-CM | POA: Diagnosis not present

## 2017-11-24 DIAGNOSIS — Z880 Allergy status to penicillin: Secondary | ICD-10-CM | POA: Diagnosis not present

## 2017-11-24 DIAGNOSIS — Z96653 Presence of artificial knee joint, bilateral: Secondary | ICD-10-CM | POA: Diagnosis not present

## 2017-11-24 DIAGNOSIS — F319 Bipolar disorder, unspecified: Secondary | ICD-10-CM | POA: Diagnosis not present

## 2017-11-24 DIAGNOSIS — M722 Plantar fascial fibromatosis: Secondary | ICD-10-CM | POA: Insufficient documentation

## 2017-11-24 DIAGNOSIS — I4891 Unspecified atrial fibrillation: Secondary | ICD-10-CM | POA: Diagnosis not present

## 2017-11-24 DIAGNOSIS — Z87891 Personal history of nicotine dependence: Secondary | ICD-10-CM | POA: Insufficient documentation

## 2017-11-24 DIAGNOSIS — Z981 Arthrodesis status: Secondary | ICD-10-CM | POA: Insufficient documentation

## 2017-11-24 DIAGNOSIS — G603 Idiopathic progressive neuropathy: Secondary | ICD-10-CM | POA: Insufficient documentation

## 2017-11-24 DIAGNOSIS — Z6841 Body Mass Index (BMI) 40.0 and over, adult: Secondary | ICD-10-CM | POA: Diagnosis not present

## 2017-11-24 DIAGNOSIS — A5216 Charcot's arthropathy (tabetic): Secondary | ICD-10-CM | POA: Insufficient documentation

## 2017-11-24 DIAGNOSIS — L97512 Non-pressure chronic ulcer of other part of right foot with fat layer exposed: Secondary | ICD-10-CM | POA: Insufficient documentation

## 2017-11-27 NOTE — Progress Notes (Addendum)
Yvette ShutterSUTPHEN, Shiri I. ([id number]) Visit Report for 11/24/2017 Arrival Information Details Patient Name: Yvette ShutterSUTPHEN, Camila I. Date of Service: 11/24/2017 10:15 AM Medical Record Number: [id number] Patient Account Number: [id number] Date of Birth/Sex: 1950-12-08 (67 y.o. Female) Treating RN: Renne CriglerFlinchum, Cheryl Primary Care Eliseo Withers: Jarome MatinPATERSON, DANIEL Other Clinician: Referring Blanch Stang: Jarome MatinPATERSON, DANIEL Treating Micaiah Litle/Extender: Linwood DibblesSTONE III, HOYT Weeks in Treatment: 17 Visit Information History Since Last Visit All ordered tests and consults were completed: No Patient Arrived: Cane Added or deleted any medications: No Arrival Time: 10:43 Any new allergies or adverse reactions: No Accompanied By: self Had a fall or experienced change in No Transfer Assistance: None activities of daily living that may affect Patient Identification Verified: Yes risk of falls: Secondary Verification Process Yes Signs or symptoms of abuse/neglect since last visito No Completed: Hospitalized since last visit: No Patient Has Alerts: Yes Pain Present Now: Yes Patient Alerts: Patient on Blood Thinner Eliquis R ABI 0.98 11/08/17 L ABI 1.05 11/08/17 R TBI 0.73 11/08/17 L TBI 0.80 11/08/17 Electronic Signature(s) Signed: 11/24/2017 5:56:09 PM By: Renne CriglerFlinchum, Cheryl Entered By: Renne CriglerFlinchum, Cheryl on 11/24/2017 10:44:55 Yvette ShutterSUTPHEN, Reggie I. ([id number]) -------------------------------------------------------------------------------- Encounter Discharge Information Details Patient Name: Yvette ShutterSUTPHEN, Danaka I. Date of Service: 11/24/2017 10:15 AM Medical Record Number: [id number] Patient Account Number: [id number] Date of Birth/Sex: 1950-12-08 (67 y.o. Female) Treating RN: Ashok CordiaPinkerton, Debi Primary Care Damariz Paganelli: Jarome MatinPATERSON, DANIEL Other Clinician: Referring Patina Spanier: Jarome MatinPATERSON, DANIEL Treating Nabiha Planck/Extender: Linwood DibblesSTONE III, HOYT Weeks in Treatment: 17 Encounter Discharge Information Items Discharge Pain Level: 0 Discharge  Condition: Stable Ambulatory Status: Cane Discharge Destination: Home Transportation: Private Auto Accompanied By: self Schedule Follow-up Appointment: Yes Medication Reconciliation completed and No provided to Patient/Care Nance Mccombs: Provided on Clinical Summary of Care: 11/24/2017 Form Type Recipient Paper Patient CS Electronic Signature(s) Signed: 11/27/2017 9:00:53 AM By: Gwenlyn PerkingMoore, Shelia Entered By: Gwenlyn PerkingMoore, Shelia on 11/24/2017 11:51:54 Yvette ShutterSUTPHEN, Starlee I. ([id number]) -------------------------------------------------------------------------------- Lower Extremity Assessment Details Patient Name: Yvette ShutterSUTPHEN, Zarina I. Date of Service: 11/24/2017 10:15 AM Medical Record Number: [id number] Patient Account Number: [id number] Date of Birth/Sex: 1950-12-08 (67 y.o. Female) Treating RN: Renne CriglerFlinchum, Cheryl Primary Care Fiora Weill: Jarome MatinPATERSON, DANIEL Other Clinician: Referring Amily Depp: Jarome MatinPATERSON, DANIEL Treating Cinthia Rodden/Extender: Linwood DibblesSTONE III, HOYT Weeks in Treatment: 17 Edema Assessment Assessed: [Left: No] [Right: No] Edema: [Left: No] [Right: No] Vascular Assessment Claudication: Claudication Assessment [Left:None] [Right:None] Pulses: Dorsalis Pedis Palpable: [Left:Yes] [Right:Yes] Posterior Tibial Extremity colors, hair growth, and conditions: Extremity Color: [Left:Mottled] [Right:Normal] Hair Growth on Extremity: [Left:Yes] [Right:Yes] Temperature of Extremity: [Left:Warm] [Right:Warm] Capillary Refill: [Left:< 3 seconds] [Right:< 3 seconds] Toe Nail Assessment Left: Right: Thick: No No Discolored: No No Deformed: No No Improper Length and Hygiene: Yes Yes Electronic Signature(s) Signed: 11/24/2017 5:56:09 PM By: Renne CriglerFlinchum, Cheryl Entered By: Renne CriglerFlinchum, Cheryl on 11/24/2017 10:56:53 Yvette ShutterSUTPHEN, Sybil I. ([id number]) -------------------------------------------------------------------------------- Multi Wound Chart Details Patient Name: Yvette ShutterSUTPHEN, Jorene I. Date of Service: 11/24/2017 10:15  AM Medical Record Number: [id number] Patient Account Number: [id number] Date of Birth/Sex: 1950-12-08 (67 y.o. Female) Treating RN: Huel CoventryWoody, Kim Primary Care Thien Berka: Jarome MatinPATERSON, DANIEL Other Clinician: Referring Brinae Woods: Jarome MatinPATERSON, DANIEL Treating Peace Noyes/Extender: Linwood DibblesSTONE III, HOYT Weeks in Treatment: 17 Vital Signs Height(in): 64 Pulse(bpm): 59 Weight(lbs): 262 Blood Pressure(mmHg): 130/52 Body Mass Index(BMI): 45 Temperature(F): 98.3 Respiratory Rate 18 (breaths/min): Photos: [1:No Photos] [2:No Photos] [3:No Photos] Wound Location: [1:Left Metatarsal head first - Plantar] [2:Left Metatarsal head fifth - Plantar] [3:Right Metatarsal head first - Plantar] Wounding Event: [1:Gradually Appeared] [2:Trauma] [3:Gradually Appeared] 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] [2:Neuropathic Ulcer-Non Diabetic] [3:Neuropathic Ulcer-Non Diabetic] Comorbid History: [1:Arrhythmia, Hypertension, Neuropathy] [2:Arrhythmia, Hypertension, Neuropathy] [3:Arrhythmia, Hypertension,  Neuropathy] Date Acquired: [1:01/22/2017] [2:05/27/2017] [3:10/05/2017] Weeks of Treatment: [1:17] [2:17] [3:7] Wound Status: [1:Open] [2:Open] [3:Open] Measurements L x W x D [1:0.2x0.2x0.3] [2:1.1x1.2x0.3] [3:0.3x0.3x0.2] (cm) Area (cm) : [1:0.031] [2:1.037] [3:0.071] Volume (cm) : [1:0.009] [2:0.311] [3:0.014] % Reduction in Area: [1:75.40%] [2:-1.60%] [3:-12.70%] % Reduction in Volume: [1:85.70%] [2:-204.90%] [3:-7.70%] Starting Position 1 [1:12] [2:8] (o'clock): Ending Position 1 [1:1] [2:11] (o'clock): Maximum Distance 1 (cm): [1:0.5] [2:0.6] Undermining: [1:Yes] [2:Yes] [3:No] Classification: [1:Full Thickness With Exposed Support Structures] [2:Full Thickness With Exposed Support Structures] [3:Full Thickness With Exposed Support Structures] Exudate Amount: [1:Large] [2:Large] [3:Medium] Exudate Type: [1:Serosanguineous] [2:Serosanguineous] [3:Serous] Exudate Color: [1:red, brown] [2:red, brown]  [3:amber] Wound Margin: [1:Distinct, outline attached] [2:Distinct, outline attached] [3:Flat and Intact] Granulation Amount: [1:Medium (34-66%)] [2:Medium (34-66%)] [3:Medium (34-66%)] Granulation Quality: [1:Red] [2:Red, Hyper-granulation] [3:Pink] Necrotic Amount: [1:Medium (34-66%)] [2:Medium (34-66%)] [3:Medium (34-66%)] Necrotic Tissue: [1:Eschar, Adherent Slough] [2:Adherent Slough] [3:Adherent Slough] Exposed Structures: [1:Fat Layer (Subcutaneous Tissue) Exposed: Yes Fascia: No Tendon: No] [2:Fat Layer (Subcutaneous Tissue) Exposed: Yes Fascia: No Tendon: No] [3:Fat Layer (Subcutaneous Tissue) Exposed: Yes Fascia: No Tendon: No] Muscle: No Muscle: No Muscle: No Joint: No Joint: No Joint: No Bone: No Bone: No Bone: No Epithelialization: None Small (1-33%) None Periwound Skin Texture: Callus: Yes Callus: Yes Callus: Yes Excoriation: No Excoriation: No Excoriation: No Induration: No Induration: No Induration: No Crepitus: No Crepitus: No Crepitus: No Rash: No Rash: No Rash: No Scarring: No Scarring: No Scarring: No Periwound Skin Moisture: Maceration: No Maceration: No Maceration: No Dry/Scaly: No Dry/Scaly: No Dry/Scaly: No Periwound Skin Color: Atrophie Blanche: No Atrophie Blanche: No Atrophie Blanche: No Cyanosis: No Cyanosis: No Cyanosis: No Ecchymosis: No Ecchymosis: No Ecchymosis: No Erythema: No Erythema: No Erythema: No Hemosiderin Staining: No Hemosiderin Staining: No Hemosiderin Staining: No Mottled: No Mottled: No Mottled: No Pallor: No Pallor: No Pallor: No Rubor: No Rubor: No Rubor: No Temperature: No Abnormality No Abnormality No Abnormality Tenderness on Palpation: Yes Yes Yes Wound Preparation: Ulcer Cleansing: Ulcer Cleansing: Ulcer Cleansing: Rinsed/Irrigated with Saline Rinsed/Irrigated with Saline Rinsed/Irrigated with Saline Topical Anesthetic Applied: Topical Anesthetic Applied: Topical Anesthetic  Applied: Other: lidocaine 4% Other: lidocaine 4% Other: lidocaine 4% Treatment Notes Electronic Signature(s) Signed: 11/27/2017 10:40:22 AM By: Elliot Gurney, BSN, RN, CWS, Kim RN, BSN Entered By: Elliot Gurney, BSN, RN, CWS, Kim on 11/24/2017 11:10:46 [name] ([id number]) -------------------------------------------------------------------------------- Multi-Disciplinary Care Plan Details Patient Name: [name] Date of Service: 11/24/2017 10:15 AM Medical Record Number: [id number] Patient Account Number: [id number] Date of Birth/Sex: 1950-12-15 (67 y.o. Female) Treating RN: Huel Coventry Primary Care Kanna Dafoe: Jarome Matin Other Clinician: Referring Jaymir Struble: Jarome Matin Treating Marsh Heckler/Extender: Lenda Kelp Weeks in Treatment: 17 Active Inactive Electronic Signature(s) Signed: 11/28/2017 9:31:58 AM By: Elliot Gurney, BSN, RN, CWS, Kim RN, BSN Previous Signature: 11/27/2017 10:40:22 AM Version By: Elliot Gurney, BSN, RN, CWS, Kim RN, BSN Entered By: Elliot Gurney, BSN, RN, CWS, Kim on 11/28/2017 09:31:57 [name] ([id number]) -------------------------------------------------------------------------------- Pain Assessment Details Patient Name: [name] Date of Service: 11/24/2017 10:15 AM Medical Record Number: [id number] Patient Account Number: [id number] Date of Birth/Sex: 1951-02-14 (67 y.o. Female) Treating RN: Renne Crigler Primary Care Daxten Kovalenko: Jarome Matin Other Clinician: Referring Shaaron Golliday: Jarome Matin Treating Anthonymichael Munday/Extender: Linwood Dibbles, HOYT Weeks in Treatment: 17 Active Problems Location of Pain Severity and Description of Pain Patient Has Paino Yes Site Locations Pain Location: Pain in Ulcers Rate the pain. Current Pain Level: 3 Pain Management and Medication Current Pain Management: Electronic Signature(s) Signed: 11/24/2017 5:56:09 PM By: Renne Crigler Entered By: Renne Crigler  on 11/24/2017 10:45:23 ZENIYAH, PEASTER  ([id number]) -------------------------------------------------------------------------------- Patient/Caregiver Education Details Patient Name: [name] Date of Service: 11/24/2017 10:15 AM Medical Record Number: [id number] Patient Account Number: [id number] Date of Birth/Gender: 1951-05-13 (67 y.o. Female) Treating RN: Ashok Cordia, Debi Primary Care Physician: Jarome Matin Other Clinician: Referring Physician: Jarome Matin Treating Physician/Extender: Skeet Simmer in Treatment: 17 Education Assessment Education Provided To: Patient Education Topics Provided Wound/Skin Impairment: Handouts: Caring for Your Ulcer, Other: change dressing as ordered Methods: Demonstration, Explain/Verbal Responses: State content correctly Electronic Signature(s) Signed: 11/24/2017 5:53:16 PM By: Alejandro Mulling Entered By: Alejandro Mulling on 11/24/2017 11:49:08 [name] ([id number]) -------------------------------------------------------------------------------- Wound Assessment Details Patient Name: [name] Date of Service: 11/24/2017 10:15 AM Medical Record Number: [id number] Patient Account Number: [id number] Date of Birth/Sex: [birth year] (67 y.o. Female) Treating RN: Renne Crigler Primary Care Marlicia Sroka: Jarome Matin Other Clinician: Referring Argil Mahl: Jarome Matin Treating Bonnie Roig/Extender: STONE III, HOYT Weeks in Treatment: 17 Wound Status Wound Number: 1 Primary Etiology: Neuropathic Ulcer-Non Diabetic Wound Location: Left Metatarsal head first - Plantar Wound Status: Open Wounding Event: Gradually Appeared Comorbid History: Arrhythmia, Hypertension, Neuropathy Date Acquired: 01/22/2017 Weeks Of Treatment: 17 Clustered Wound: No Photos Photo Uploaded By: Renne Crigler on 11/24/2017 17:50:24 Wound Measurements Length: (cm) 0.2 Width: (cm) 0.2 Depth: (cm) 0.3 Area: (cm) 0.031 Volume: (cm) 0.009 % Reduction in Area:  75.4% % Reduction in Volume: 85.7% Epithelialization: None Tunneling: No Undermining: Yes Starting Position (o'clock): 12 Ending Position (o'clock): 1 Maximum Distance: (cm) 0.5 Wound Description Full Thickness With Exposed Support Classification: Structures Wound Margin: Distinct, outline attached Exudate Large Amount: Exudate Type: Serosanguineous Exudate Color: red, brown Foul Odor After Cleansing: No Slough/Fibrino Yes Wound Bed Granulation Amount: Medium (34-66%) Exposed Structure Granulation Quality: Red Fascia Exposed: No Necrotic Amount: Medium (34-66%) Fat Layer (Subcutaneous Tissue) Exposed: Yes Guil[place]te, Delainy I. ([id number]) Necrotic Quality: Eschar, Adherent Slough Tendon Exposed: No Muscle Exposed: No Joint Exposed: No Bone Exposed: No Periwound Skin Texture Texture Color No Abnormalities Noted: No No Abnormalities Noted: No Callus: Yes Atrophie Blanche: No Crepitus: No Cyanosis: No Excoriation: No Ecchymosis: No Induration: No Erythema: No Rash: No Hemosiderin Staining: No Scarring: No Mottled: No Pallor: No Moisture Rubor: No No Abnormalities Noted: No Dry / Scaly: No Temperature / Pain Maceration: No Temperature: No Abnormality Tenderness on Palpation: Yes Wound Preparation Ulcer Cleansing: Rinsed/Irrigated with Saline Topical Anesthetic Applied: Other: lidocaine 4%, Electronic Signature(s) Signed: 11/24/2017 5:56:09 PM By: Renne Crigler Entered By: Renne Crigler on 11/24/2017 10:59:41 [name] ([id number]) -------------------------------------------------------------------------------- Wound Assessment Details Patient Name: [name] Date of Service: 11/24/2017 10:15 AM Medical Record Number: [id number] Patient Account Number: [id number] Date of Birth/Sex: 1951-02-21 (67 y.o. Female) Treating RN: Renne Crigler Primary Care Olman Yono: Jarome Matin Other Clinician: Referring Lashawnna Lambrecht: Jarome Matin Treating Kennia Vanvorst/Extender: STONE III, HOYT Weeks in Treatment: 17 Wound Status Wound Number: 2 Primary Etiology: Neuropathic Ulcer-Non Diabetic Wound Location: Left Metatarsal head fifth - Plantar Wound Status: Open Wounding Event: Trauma Comorbid History: Arrhythmia, Hypertension, Neuropathy Date Acquired: 05/27/2017 Weeks Of Treatment: 17 Clustered Wound: No Photos Photo Uploaded By: Renne Crigler on 11/24/2017 17:50:37 Wound Measurements Length: (cm) 1.1 Width: (cm) 1.2 Depth: (cm) 0.3 Area: (cm) 1.037 Volume: (cm) 0.311 % Reduction in Area: -1.6% % Reduction in Volume: -204.9% Epithelialization: Small (1-33%) Tunneling: No Undermining: Yes Starting Position (o'clock): 8 Ending Position (o'clock): 11 Maximum Distance: (cm) 0.6 Wound Description Full Thickness With Exposed Support Classification: Structures Wound Margin: Distinct, outline attached Exudate Large Amount: Exudate Type:  Serosanguineous Exudate Color: red, brown Foul Odor After Cleansing: No Slough/Fibrino Yes Wound Bed Granulation Amount: Medium (34-66%) Exposed Structure Granulation Quality: Red, Hyper-granulation Fascia Exposed: No Necrotic Amount: Medium (34-66%) Fat Layer (Subcutaneous Tissue) Exposed: Yes Keagy, Latanza I. ([id number]) Necrotic Quality: Adherent Slough Tendon Exposed: No Muscle Exposed: No Joint Exposed: No Bone Exposed: No Periwound Skin Texture Texture Color No Abnormalities Noted: No No Abnormalities Noted: No Callus: Yes Atrophie Blanche: No Crepitus: No Cyanosis: No Excoriation: No Ecchymosis: No Induration: No Erythema: No Rash: No Hemosiderin Staining: No Scarring: No Mottled: No Pallor: No Moisture Rubor: No No Abnormalities Noted: No Dry / Scaly: No Temperature / Pain Maceration: No Temperature: No Abnormality Tenderness on Palpation: Yes Wound Preparation Ulcer Cleansing: Rinsed/Irrigated with Saline Topical Anesthetic  Applied: Other: lidocaine 4%, Electronic Signature(s) Signed: 11/24/2017 5:56:09 PM By: Renne Crigler Entered By: Renne Crigler on 11/24/2017 11:00:52 [name] ([id number]) -------------------------------------------------------------------------------- Wound Assessment Details Patient Name: [name] Date of Service: 11/24/2017 10:15 AM Medical Record Number: [id number] Patient Account Number: [id number] Date of Birth/Sex: 02-25-1951 (67 y.o. Female) Treating RN: Renne Crigler Primary Care Kassity Woodson: Jarome Matin Other Clinician: Referring Darshay Deupree: Jarome Matin Treating Alazae Crymes/Extender: STONE III, HOYT Weeks in Treatment: 17 Wound Status Wound Number: 3 Primary Etiology: Neuropathic Ulcer-Non Diabetic Wound Location: Right Metatarsal head first - Plantar Wound Status: Open Wounding Event: Gradually Appeared Comorbid History: Arrhythmia, Hypertension, Neuropathy Date Acquired: 10/05/2017 Weeks Of Treatment: 7 Clustered Wound: No Photos Photo Uploaded By: Renne Crigler on 11/24/2017 17:50:49 Wound Measurements Length: (cm) 0.3 Width: (cm) 0.3 Depth: (cm) 0.2 Area: (cm) 0.071 Volume: (cm) 0.014 % Reduction in Area: -12.7% % Reduction in Volume: -7.7% Epithelialization: None Tunneling: No Undermining: No Wound Description Full Thickness With Exposed Support Classification: Structures Wound Margin: Flat and Intact Exudate Medium Amount: Exudate Type: Serous Exudate Color: amber Foul Odor After Cleansing: No Slough/Fibrino No Wound Bed Granulation Amount: Medium (34-66%) Exposed Structure Granulation Quality: Pink Fascia Exposed: No Necrotic Amount: Medium (34-66%) Fat Layer (Subcutaneous Tissue) Exposed: Yes Necrotic Quality: Adherent Slough Tendon Exposed: No Muscle Exposed: No Joint Exposed: No Bone Exposed: No Fury, Jayana I. ([id number]) Periwound Skin Texture Texture Color No Abnormalities Noted: No No  Abnormalities Noted: No Callus: Yes Atrophie Blanche: No Crepitus: No Cyanosis: No Excoriation: No Ecchymosis: No Induration: No Erythema: No Rash: No Hemosiderin Staining: No Scarring: No Mottled: No Pallor: No Moisture Rubor: No No Abnormalities Noted: No Dry / Scaly: No Temperature / Pain Maceration: No Temperature: No Abnormality Tenderness on Palpation: Yes Wound Preparation Ulcer Cleansing: Rinsed/Irrigated with Saline Topical Anesthetic Applied: Other: lidocaine 4%, Electronic Signature(s) Signed: 11/24/2017 5:56:09 PM By: Renne Crigler Entered By: Renne Crigler on 11/24/2017 11:01:37 [name] ([id number]) -------------------------------------------------------------------------------- Vitals Details Patient Name: [name] Date of Service: 11/24/2017 10:15 AM Medical Record Number: [id number] Patient Account Number: [id number] Date of Birth/Sex: 10/11/1950 (67 y.o. Female) Treating RN: Renne Crigler Primary Care Chance Karam: Jarome Matin Other Clinician: Referring Shirlie Enck: Jarome Matin Treating Traye Bates/Extender: Linwood Dibbles, HOYT Weeks in Treatment: 17 Vital Signs Time Taken: 10:46 Temperature (F): 98.3 Height (in): 64 Pulse (bpm): 59 Weight (lbs): 262 Respiratory Rate (breaths/min): 18 Body Mass Index (BMI): 45 Blood Pressure (mmHg): 130/52 Reference Range: 80 - 120 mg / dl Electronic Signature(s) Signed: 11/24/2017 5:56:09 PM By: Renne Crigler Entered By: Renne Crigler on 11/24/2017 10:46:24

## 2017-11-27 NOTE — Progress Notes (Signed)
KYNLIE, JANE ([id number]) Visit Report for 11/24/2017 Chief Complaint Document Details Patient Name: SHEMECA, LUKASIK Date of Service: 11/24/2017 10:15 AM Medical Record Number: [id number] Patient Account Number: [id number] Date of Birth/Sex: 1951-08-10 (67 y.o. Female) Treating RN: Huel Coventry Primary Care Provider: Jarome Matin Other Clinician: Referring Provider: Jarome Matin Treating Provider/Extender: Linwood Dibbles, Alijah Akram Weeks in Treatment: 17 Information Obtained from: Patient Chief Complaint Patient presents to the wound care center for a consult due non healing wound to the left plantar foot which she's had on and off for a number of years Electronic Signature(s) Signed: 11/25/2017 10:02:55 PM By: Lenda Kelp PA-C Entered By: Lenda Kelp on 11/24/2017 10:36:36 [name] ([id number]) -------------------------------------------------------------------------------- Debridement Details Patient Name: [name] Date of Service: 11/24/2017 10:15 AM Medical Record Number: [id number] Patient Account Number: [id number] Date of Birth/Sex: 04-25-1951 (67 y.o. Female) Treating RN: Huel Coventry Primary Care Provider: Jarome Matin Other Clinician: Referring Provider: Jarome Matin Treating Provider/Extender: Linwood Dibbles, Kally Cadden Weeks in Treatment: 17 Debridement Performed for Wound #3 Right,Plantar Metatarsal head first Assessment: Performed By: Physician STONE III, Tyquasia Pant E., PA-C Debridement: Debridement Pre-procedure Verification/Time Yes - 11:12 Out Taken: Start Time: 11:13 Pain Control: Other : lidocaine 4% Level: Skin/Subcutaneous Tissue Total Area Debrided (L x W): 0.3 (cm) x 0.3 (cm) = 0.09 (cm) Tissue and other material Viable, Non-Viable, Callus, Subcutaneous debrided: Instrument: Curette Bleeding: Minimum Hemostasis Achieved: Pressure End Time: 11:13 Procedural Pain: 0 Post Procedural Pain: 0 Response to Treatment: Procedure was  tolerated well Post Debridement Measurements of Total Wound Length: (cm) 0.3 Width: (cm) 0.4 Depth: (cm) 0.3 Volume: (cm) 0.028 Character of Wound/Ulcer Post Debridement: Stable Post Procedure Diagnosis Same as Pre-procedure Electronic Signature(s) Signed: 11/25/2017 10:02:55 PM By: Lenda Kelp PA-C Signed: 11/27/2017 10:40:22 AM By: Elliot Gurney, BSN, RN, CWS, Kim RN, BSN Entered By: Elliot Gurney, BSN, RN, CWS, Kim on 11/24/2017 11:14:12 [name] ([id number]) -------------------------------------------------------------------------------- Debridement Details Patient Name: [name] Date of Service: 11/24/2017 10:15 AM Medical Record Number: [id number] Patient Account Number: [id number] Date of Birth/Sex: [birth year] (67 y.o. Female) Treating RN: Huel Coventry Primary Care Provider: Jarome Matin Other Clinician: Referring Provider: Jarome Matin Treating Provider/Extender: Linwood Dibbles, Prudencio Velazco Weeks in Treatment: 17 Debridement Performed for Wound #2 Left,Plantar Metatarsal head fifth Assessment: Performed By: Physician STONE III, Carrye Goller E., PA-C Debridement: Debridement Pre-procedure Verification/Time Yes - 11:12 Out Taken: Start Time: 11:13 Pain Control: Other : lidocaine 4% Level: Skin/Subcutaneous Tissue Total Area Debrided (L x W): 1.1 (cm) x 1.2 (cm) = 1.32 (cm) Tissue and other material Viable, Non-Viable, Callus, Subcutaneous debrided: Instrument: Curette Bleeding: Minimum Hemostasis Achieved: Pressure End Time: 11:13 Procedural Pain: 0 Post Procedural Pain: 0 Response to Treatment: Procedure was tolerated well Post Debridement Measurements of Total Wound Length: (cm) 1.1 Width: (cm) 1.2 Depth: (cm) 0.4 Volume: (cm) 0.415 Character of Wound/Ulcer Post Debridement: Stable Post Procedure Diagnosis Same as Pre-procedure Electronic Signature(s) Signed: 11/25/2017 10:02:55 PM By: Lenda Kelp PA-C Signed: 11/27/2017 10:40:22 AM By: Elliot Gurney, BSN, RN, CWS,  Kim RN, BSN Entered By: Elliot Gurney, BSN, RN, CWS, Kim on 11/24/2017 11:14:46 [name] ([id number]) -------------------------------------------------------------------------------- Debridement Details Patient Name: [name] Date of Service: 11/24/2017 10:15 AM Medical Record Number: [id number] Patient Account Number: [id number] Date of Birth/Sex: 1951-03-03 (67 y.o. Female) Treating RN: Huel Coventry Primary Care Provider: Jarome Matin Other Clinician: Referring Provider: Jarome Matin Treating Provider/Extender: Linwood Dibbles, Brennan Litzinger Weeks in Treatment: 17 Debridement Performed for Wound #1 Left,Plantar Metatarsal head first Assessment: Performed By:  Physician STONE III, Evelyn Moch E., PA-C Debridement: Debridement Pre-procedure Verification/Time Yes - 11:12 Out Taken: Start Time: 11:13 Pain Control: Other : lidocaine 4% Level: Skin/Subcutaneous Tissue Total Area Debrided (L x W): 0.2 (cm) x 0.2 (cm) = 0.04 (cm) Tissue and other material Viable, Non-Viable, Callus, Subcutaneous debrided: Instrument: Curette Bleeding: Moderate Hemostasis Achieved: Silver Nitrate End Time: 11:19 Procedural Pain: 0 Post Procedural Pain: 0 Response to Treatment: Procedure was tolerated well Post Debridement Measurements of Total Wound Length: (cm) 0.2 Width: (cm) 0.3 Depth: (cm) 0.4 Volume: (cm) 0.019 Character of Wound/Ulcer Post Debridement: Stable Post Procedure Diagnosis Same as Pre-procedure Electronic Signature(s) Signed: 11/25/2017 10:02:55 PM By: Lenda Kelp PA-C Signed: 11/27/2017 10:40:22 AM By: Elliot Gurney, BSN, RN, CWS, Kim RN, BSN Entered By: Elliot Gurney, BSN, RN, CWS, Kim on 11/24/2017 11:20:12 [name] ([id number]) -------------------------------------------------------------------------------- HPI Details Patient Name: [name] Date of Service: 11/24/2017 10:15 AM Medical Record Number: [id number] Patient Account Number: [id number] Date of Birth/Sex:  12/19/1950 (67 y.o. Female) Treating RN: Huel Coventry Primary Care Provider: Jarome Matin Other Clinician: Referring Provider: Jarome Matin Treating Provider/Extender: Linwood Dibbles, Zigmond Trela Weeks in Treatment: 17 History of Present Illness Location: if plantar foot in the region of the first metatarsal head and the fifth metatarsal head Quality: Patient reports No Pain. Severity: Patient states wound are getting worse. Duration: Patient has had the wound for > 3 months prior to seeking treatment at the wound center Context: The wound would happen gradually Modifying Factors: Other treatment(s) tried include:is been under the care of [facility] orthopedics Associated Signs and Symptoms: Patient reports having increase swelling. HPI Description: 67 year old patient referred to was from [facility] orthopedics where she was seen by Dr. Laverta Baltimore team saw her for a left foot ulcer which was there for about 4 months. She was seen in the emergency room on 06/05/2017 and they did a MRI which ruled out osteomyelitis and they placed her on antibiotics, which was Keflex for 3 weeks. She was found to have plantar ulcers in the region of the great toe and on the fifth metatarsal head. 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 on examination the patient was found to have 2 ulcers on the plantar aspect of the left forefoot the one on the fifth metatarsal head was 2-1/2 cm in diameter and down to the muscle. There was a lot of hypertrophic granulation tissue. It did not probe down to bone. Note the patient was seen in July of this year by Dr. 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 She was recommended a hallux MP  joint arthrodesis to correct the bunion and anti-tic conditions and she would weight-bear in a cam boot postoperatively. Her most recent MRI done on September 12 -- IMPRESSION: Skin wound at the fifth MTP joint without underlying abscess, septic joint or osteomyelitis.Hallux valgus and first MTP osteoarthritis. Midfoot degenerative change also noted. 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 Right toe brachial index of 0.52 and left of 0.4 suggestive of abnormal arterial flow at rest. left flow was monophasic on the dorsalis pedis and biphasic on the right side. 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 08/10/2017 -- she has upcoming appointments to see Dr. Victorino Dike and also to see the vascular surgeons at [facility]. Other than that she is doing well and being compliant with a dressing. 08/14/2017 -- she saw Dr. Victorino Dike earlier this morning and his note is pending. She has not yet had her appointment at the vascular office as yet. 08/21/2017 -- we have not yet received the consultation note from Dr. Victorino Dike and how vascular appointment is later this week. 08/28/17 on evaluation today patient appears to be doing fairly well in regard to the ulcers on her left lower extremity. She did have her arterial study with the following results. According to left arterial duplex study patient's left lower extremity arterial system is patent without evidence of hemodynamically significant stenosis. Peak systolic velocity of the bed superficial femoral artery is suggestive of a 30-49% stenosis with wall irregularity however the ratio is less than 1.5 Fortunately, she tells me that she does have  an appointment scheduled for January 8 Lauren arteriogram as well to further evaluate this. No fevers, chills, nausea, or vomiting noted at this time. Overall she is pleased with how things have been progressing. 09/07/2017 -- -- was seen by Dr. Durene Cal, who reviewed her vascular lab studies which showed ABI of 0.73 on the left and 1.02 on the right and monophasic waveforms throughout the left leg with no significant stenosis. Toe pressure was 50 on Mooney, Kaela I. ([id number]) the left. He has recommended proceeding with angiography and this has been scheduled for early January as per the patientos request. 10/05/17 on evaluation today patient's wound appears to be doing fairly well at this point. She has been recommended to go forward with an angiogram and that had been rescheduled but at this point in time is actually scheduled for the 15th of this month which is next week. Unfortunately this had to be pushed back. This was due to her blood thinner medication which she thought she needed to stop a different day. Nonetheless her wound does not appear to be too bad in regard to the [facility] surface of the foot is just not showing good signs of improving. Basically she is maintaining. Unfortunately she did have a new area on the right plantar surface of her foot which was incidentally noted by patient and right and suggested to [place] to look at and unfortunately she had a crack callous that revealed a small ulcer underneath. The good news is she has much better blood flow to the site. The bad news is obviously she now has an additional wound. 10/20/17 on evaluation today patient presents following her angiogram with subsequent stent placement of the left, and iliac artery. Since her procedure her blood flow does seem to be better. 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 She had no significant  outflow or runoff disease bilaterally. Obviously this is good news. This was Dr. Myra Gianotti who performed the procedure and that was on 10/10/17 she does have follow-up evaluation with them for repeat ABI's and arterial studies on 11/08/17 10/27/17 on evaluation today patient appears to be doing fairly well in regard to her bilateral foot ulcers. She is definitely having some improvement in regard to all three and my opinion. With that  being said this is obviously somewhat slow I do think the revascularization has made a big difference for her. Nonetheless I did have a brief discussion with her today about a total contact cast. I think this would likely be the best thing to help her improve more rapidly in regard to appropriate offloading. She is somewhat willing to give this a try she believes that she could wear this without any balance issues she had worn a walking boot previously. 11/10/17 on evaluation today patient appears to be doing a little better in regard to the right foot ulcer as well is the left medial/plantar foot ulcer. With that being said the lateral foot ulcer on the left seems to be a little bit worse than that she has a significant amount of hyper granular tissue noted within the wound bed. Subsequently she has also noted more drainage from the site which I think may be directly related. She has been tolerating the dressing changes which is good news up to this point. We have discussed the possibility of a total contact cast for the left lower extremity although right now with the drainage she's unsure and I can to agree as to whether this would be the appropriate thing to proceed with. 11/17/17 on evaluation today patient appears to be doing about the same in regard to her ulcers. She has been tolerating the dressing changes without complication. With that being said patient tells me that she's not having any additional pain but does continue to have a lot of drainage. No fevers,  chills, nausea, or vomiting noted at this time. Otherwise she's been doing fairly well. The MRI has not been scheduled yet she's going to be doing this agrees for imaging but has not yet been given an actual time for the test. 11/24/17 on evaluation today patient appears to be doing better in my opinion in regard to all three ulcers. She still has callous that has formed but this does not seem to be as severe as noted previously prior to the peg assist offloading shoes. I do believe this has been a nice change for her. Subsequently she has been tolerating the dressing changes without complication. She does tell me that the left lateral foot ulcer seems to be the worst for her and this does cause some burning at times but fortunately nothing too significant. There's no evidence of worsening or even present infection at this point. Electronic Signature(s) Signed: 11/25/2017 10:02:55 PM By: Lenda Kelp PA-C Entered By: Lenda Kelp on 11/24/2017 13:19:50 [name] ([id number]) -------------------------------------------------------------------------------- Physical Exam Details Patient Name: [name] Date of Service: 11/24/2017 10:15 AM Medical Record Number: [id number] Patient Account Number: [id number] Date of Birth/Sex: Apr 26, 1951 (67 y.o. Female) Treating RN: Huel Coventry Primary Care Provider: Jarome Matin Other Clinician: Referring Provider: Jarome Matin Treating Provider/Extender: STONE III, Rumaisa Schnetzer Weeks in Treatment: 17 Constitutional Well-nourished and well-hydrated in no acute distress. Respiratory normal breathing without difficulty. Psychiatric this patient is able to make decisions and demonstrates good insight into disease process. Alert and Oriented x 3. pleasant and cooperative. Notes Patient's wounds all did have evidence of callous surrounding although the left lateral ulcer really did not have as much callous as hyper granular tissue. The slough,  callous, and have a granular tissue at all three locations was removed during evaluation today and patient tolerated this without complication. Post debridement all three wounds appear to be doing much better. Electronic Signature(s) Signed: 11/25/2017 10:02:55 PM By: Lenda Kelp  PA-C Entered By: Lenda Kelp on 11/24/2017 13:20:40 [name] ([id number]) -------------------------------------------------------------------------------- Physician Orders Details Patient Name: [name] Date of Service: 11/24/2017 10:15 AM Medical Record Number: [id number] Patient Account Number: [id number] Date of Birth/Sex: Apr 17, 1951 (67 y.o. 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 o May Shower, gently pat wound dry prior to applying new dressing. Wound #2 Left,Plantar Metatarsal head fifth o Clean wound with Normal Saline. o May Shower, gently pat wound dry prior to applying new dressing. Wound #3 Right,Plantar Metatarsal head first o Clean wound with Normal Saline. o May Shower, gently pat wound dry prior to applying new dressing. Anesthetic (add to Medication List) Wound #1 Left,Plantar Metatarsal head first o Topical Lidocaine 4% cream applied to wound bed prior to debridement (In Clinic Only). Wound #2 Left,Plantar Metatarsal head  fifth o Topical Lidocaine 4% cream applied to wound bed prior to debridement (In Clinic Only). Wound #3 Right,Plantar Metatarsal head first o Topical Lidocaine 4% cream applied to wound bed prior to debridement (In Clinic Only). Primary Wound Dressing Wound #1 Left,Plantar Metatarsal head first o Other: - silvercell Wound #2 Left,Plantar Metatarsal head fifth o Other: - silvercell Wound #3 Right,Plantar Metatarsal head first o Other: - silvercell Secondary Dressing Donaghue, Timia I. ([id number]) Wound #1 Left,Plantar Metatarsal head first o Boardered Foam Dressing Wound #2 Left,Plantar Metatarsal head fifth o Boardered Foam Dressing Wound #3 Right,Plantar Metatarsal head first o Boardered Foam Dressing Dressing Change Frequency Wound #1 Left,Plantar Metatarsal head first o Change dressing every other day. Wound #2 Left,Plantar Metatarsal head fifth o Change dressing every other day. Wound #3 Right,Plantar Metatarsal head first o Change dressing every other day. Follow-up Appointments Wound #1 Left,Plantar Metatarsal head first o Return Appointment in 1 week. Wound #2 Left,Plantar Metatarsal head fifth o Return Appointment in 1 week. Wound #3 Right,Plantar Metatarsal head first o Return Appointment in 1 week. Edema Control Wound #1 Left,Plantar Metatarsal head first o Elevate legs to the level of the heart and pump ankles as often as possible Wound #2 Left,Plantar Metatarsal head fifth o Elevate legs to the level of the heart and pump ankles as often as possible Wound #3 Right,Plantar Metatarsal head first o Elevate legs to the level of the heart and pump ankles as often as possible Off-Loading Wound #1 Left,Plantar Metatarsal head first o Open toe surgical shoe to: - bilateral feet - darco with peg assist Additional Orders / Instructions Wound #1 Left,Plantar Metatarsal head first o Increase protein intake. Wound #2 Left,Plantar  Metatarsal head fifth o Increase protein intake. Wound #3 Right,Plantar Metatarsal head first o Increase protein intake. Patient Medications GISELL, BUEHRLE I. ([id number]) Allergies: amoxicillin, Ambien Notifications Medication Indication Start End lidocaine DOSE topical 4 % cream - cream topical Electronic Signature(s) Signed: 11/25/2017 10:02:55 PM By: Lenda Kelp PA-C Signed: 11/27/2017 10:40:22 AM By: Elliot Gurney, BSN, RN, CWS, Kim RN, BSN Entered By: Elliot Gurney, BSN, RN, CWS, Kim on 11/24/2017 11:12:59 [name] ([id number]) -------------------------------------------------------------------------------- Prescription 11/24/2017 Patient  Name: [name] Provider: Lenda Kelp PA-C Date of Birth: [birth year] NPI#: [id number] Sex: F DEA#: [id number] Phone #: [phone] License #: Patient Address: [facility] [address] [facility] [place], [place] [address] [address] [place], [place] [address] [phone] Allergies amoxicillin Ambien Medication Medication: Route: Strength: Form: lidocaine topical 4% cream Class: TOPICAL LOCAL ANESTHETICS Dose: Frequency / Time: Indication: cream topical Number of Refills: Number of Units: 0 Generic Substitution: Start Date: End Date: Administered at Substitution Permitted Facility: Yes Time Administered: Time Discontinued: Note to Pharmacy: Signature(s): Date(s): Electronic Signature(s) Signed: 11/25/2017 10:02:55 PM By: Lenda Kelp PA-C Signed: 11/27/2017 10:40:22 AM By: Elliot Gurney, BSN, RN, CWS, Kim RN, BSN [place], [place] I. ([id number]) Entered By: Elliot Gurney, BSN, RN, CWS, Kim on 11/24/2017 11:12:59 [name] ([id number]) --------------------------------------------------------------------------------  Problem List Details Patient Name: [name] Date of Service: 11/24/2017 10:15 AM Medical Record Number:  [id number] Patient Account Number: [id number] Date of Birth/Sex: 30-Jun-1951 (67 y.o. 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 Female) Treating RN: Huel Coventry Primary Care Provider: Jarome Matin Other Clinician: Referring Provider: Jarome Matin Treating Provider/Extender: Linwood Dibbles, Jameya Pontiff Weeks in Treatment: 17 Subjective Chief Complaint Information obtained from Patient Patient presents to the wound care center for a consult due non healing wound to the left plantar foot which she's had on and off for a number of years History of Present Illness (HPI) The following HPI elements were documented for the patient's wound: Location: if plantar foot in the region of the first metatarsal head and the fifth metatarsal head Quality: Patient reports No Pain. Severity: Patient states wound are getting  worse. Duration: Patient has had the wound for > 3 months prior to seeking treatment at the wound center Context: The wound would happen gradually Modifying Factors: Other treatment(s) tried include:is been under the care of [facility] orthopedics Associated Signs and Symptoms: Patient reports having increase swelling. 67 year old patient referred to was from [facility] orthopedics where she was seen by Dr. Laverta Baltimore team saw her for a left foot ulcer which was there for about 4 months. She was seen in the emergency room on 06/05/2017 and they did a MRI which ruled out osteomyelitis and they placed her on antibiotics, which was Keflex for 3 weeks. She was found to have plantar ulcers in the region of the great toe and on the fifth metatarsal head. 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 on examination the patient was found to have 2 ulcers  on the plantar aspect of the left forefoot the one on the fifth metatarsal head was 2-1/2 cm in diameter and down to the muscle. There was a lot of hypertrophic granulation tissue. It did not probe down to bone. Note the patient was seen in July of this year by Dr. 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 She was recommended a hallux MP joint arthrodesis to correct the bunion and anti-tic conditions and she would weight-bear in a cam boot postoperatively. Her most recent MRI done on September 12 -- IMPRESSION: Skin wound at the fifth MTP joint without underlying abscess, septic joint or osteomyelitis.Hallux valgus and first MTP osteoarthritis. Midfoot degenerative change also noted. 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 Right toe brachial index of 0.52 and left of 0.4 suggestive of abnormal arterial flow at rest. left flow was monophasic on the dorsalis pedis and biphasic on the right side. 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 08/10/2017 -- she has upcoming appointments to see Dr. Victorino Dike and also to see the vascular surgeons at [facility]. Other than that she is doing well and being compliant with a dressing. 08/14/2017 -- she saw Dr. Victorino Dike earlier this morning and his note is pending. She has not yet had her appointment at the vascular office as yet. 08/21/2017 -- we have not yet received the consultation note from Dr. Victorino Dike and how vascular appointment is later this week. 08/28/17 on evaluation today patient appears to be doing fairly well in regard to the ulcers on her left lower extremity. She did have her arterial study with the following results. According to left arterial duplex study patient's left lower extremity arterial system is patent without evidence of Preisler, Maleah I. ([id number]) hemodynamically significant stenosis. Peak systolic velocity of the bed superficial femoral artery is suggestive of a 30-49% stenosis with wall irregularity however the ratio is less than 1.5 Fortunately, she tells me that she does have an appointment scheduled for January 8 Lauren arteriogram as well to further evaluate this. No fevers, chills, nausea, or vomiting noted at this time. Overall she is pleased with how things have been progressing. 09/07/2017 -- -- was seen by Dr. Durene Cal, who reviewed her vascular lab studies which showed ABI of 0.73 on the left and 1.02 on the right and monophasic waveforms throughout the left leg  with no significant stenosis. Toe pressure was 50 on the left. He has recommended proceeding with angiography and this has been scheduled for early January as per the patient s request. 10/05/17 on evaluation today patient's wound appears to be doing fairly well at this point. She has been recommended to go forward with an angiogram and that had been rescheduled but at this point in time is actually scheduled for the 15th of this month which is next week. Unfortunately this had to be pushed back. This was due to her blood thinner medication which she thought she needed to stop a different day. Nonetheless her wound does not appear to be too bad in regard to the [facility] surface of the foot is just not showing good signs of improving. Basically she is maintaining. Unfortunately  she did have a new area on the right plantar surface of her foot which was incidentally noted by patient and right and suggested to [place] to look at and unfortunately she had a crack callous that revealed a small ulcer underneath. The good news is she has much better blood flow to the site. The bad news is obviously she now has an additional wound. 10/20/17 on evaluation today patient presents following her angiogram with subsequent stent placement of the left, and iliac artery. Since her procedure her blood flow does seem to be better. 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 She had no significant outflow or runoff disease bilaterally. Obviously this is good news. This was Dr. Myra Gianotti who performed the procedure and that was on 10/10/17 she does have follow-up evaluation with them for repeat ABI's and arterial studies on 11/08/17 10/27/17 on evaluation today patient appears to be doing fairly well in regard to her bilateral foot ulcers. She is definitely having some improvement in regard to all three and my opinion. With that being  said this is obviously somewhat slow I do think the revascularization has made a big difference for her. Nonetheless I did have a brief discussion with her today about a total contact cast. I think this would likely be the best thing to help her improve more rapidly in regard to appropriate offloading. She is somewhat willing to give this a try she believes that she could wear this without any balance issues she had worn a walking boot previously. 11/10/17 on evaluation today patient appears to be doing a little better in regard to the right foot ulcer as well is the left medial/plantar foot ulcer. With that being said the lateral foot ulcer on the left seems to be a little bit worse than that she has a significant amount of hyper granular tissue noted within the wound bed. Subsequently she has also noted more drainage from the site which I think may be directly related. She has been tolerating the dressing changes which is good news up to this point. We have discussed the possibility of a total contact cast for the left lower extremity although right now with the drainage she's unsure and I can to agree as to whether this would be the appropriate thing to proceed with. 11/17/17 on evaluation today patient appears to be doing about the same in regard to her ulcers. She has been tolerating the dressing changes without complication. With that being said patient tells me that she's not having any additional pain but does continue to have a lot of drainage. No fevers, chills, nausea, or vomiting noted at this time. Otherwise she's been doing fairly well. The MRI has not been scheduled yet she's going to be doing this agrees for imaging but has not yet been given an actual time for the test. 11/24/17 on evaluation today patient appears to be doing better in my opinion in regard to all three ulcers. She still has callous that has formed but this does not seem to be as severe as noted previously prior to the  peg assist offloading shoes. I do believe this has been a nice change for her. Subsequently she has been tolerating the dressing changes without complication. She does tell me that the left lateral foot ulcer seems to be the worst for her and this does cause some burning at times but fortunately nothing too  significant. There's no evidence of worsening or even present infection at this point. Patient History Information obtained from Patient. Social History Former smoker - quit 25 years ago, Marital Status - Single, Alcohol Use - Never, Drug Use - No History, Caffeine Use - Daily. Cato MulliganSUTPHEN, Marabelle IMarland Kitchen. ([id number]) Review of Systems (ROS) Constitutional Symptoms (General Health) Denies complaints or symptoms of Fever, Chills. Respiratory The patient has no complaints or symptoms. Cardiovascular The patient has no complaints or symptoms. Psychiatric The patient has no complaints or symptoms. Objective Constitutional Well-nourished and well-hydrated in no acute distress. Vitals Time Taken: 10:46 AM, Height: 64 in, Weight: 262 lbs, BMI: 45, Temperature: 98.3 F, Pulse: 59 bpm, Respiratory Rate: 18 breaths/min, Blood Pressure: 130/52 mmHg. Respiratory normal breathing without difficulty. Psychiatric this patient is able to make decisions and demonstrates good insight into disease process. Alert and Oriented x 3. pleasant and cooperative. General Notes: Patient's wounds all did have evidence of callous surrounding although the left lateral ulcer really did not have as much callous as hyper granular tissue. The slough, callous, and have a granular tissue at all three locations was removed during evaluation today and patient tolerated this without complication. Post debridement all three wounds appear to be doing much better. Integumentary (Hair, Skin) Wound #1 status is Open. Original cause of wound was Gradually Appeared. The wound is located on the Left,Plantar Metatarsal head first. The  wound measures 0.2cm length x 0.2cm width x 0.3cm depth; 0.031cm^2 area and 0.009cm^3 volume. There is Fat Layer (Subcutaneous Tissue) Exposed exposed. There is no tunneling noted, however, there is undermining starting at 12:00 and ending at 1:00 with a maximum distance of 0.5cm. There is a large amount of serosanguineous drainage noted. The wound margin is distinct with the outline attached to the wound base. There is medium (34-66%) red granulation within the wound bed. There is a medium (34-66%) amount of necrotic tissue within the wound bed including Eschar and Adherent Slough. The periwound skin appearance exhibited: Callus. The periwound skin appearance did not exhibit: Crepitus, Excoriation, Induration, Rash, Scarring, Dry/Scaly, Maceration, Atrophie Blanche, Cyanosis, Ecchymosis, Hemosiderin Staining, Mottled, Pallor, Rubor, Erythema. Periwound temperature was noted as No Abnormality. The periwound has tenderness on palpation. Wound #2 status is Open. Original cause of wound was Trauma. The wound is located on the Left,Plantar Metatarsal head fifth. The wound measures 1.1cm length x 1.2cm width x 0.3cm depth; 1.037cm^2 area and 0.311cm^3 volume. There is Fat Layer (Subcutaneous Tissue) Exposed exposed. There is no tunneling noted, however, there is undermining starting at 8:00 and ending at 11:00 with a maximum distance of 0.6cm. There is a large amount of serosanguineous drainage noted. The wound margin is distinct with the outline attached to the wound base. There is medium (34-66%) red, hyper - granulation within the wound bed. There is a medium (34-66%) amount of necrotic tissue within the wound bed including Adherent Slough. The periwound skin appearance exhibited: Callus. The periwound skin appearance did not exhibit: Crepitus, Nierenberg, Rion I. ([id number]) Excoriation, Induration, Rash, Scarring, Dry/Scaly, Maceration, Atrophie Blanche, Cyanosis, Ecchymosis, Hemosiderin Staining,  Mottled, Pallor, Rubor, Erythema. Periwound temperature was noted as No Abnormality. The periwound has tenderness on palpation. Wound #3 status is Open. Original cause of wound was Gradually Appeared. The wound is located on the Right,Plantar Metatarsal head first. The wound measures 0.3cm length x 0.3cm width x 0.2cm depth; 0.071cm^2 area and 0.014cm^3 volume. There is Fat Layer (Subcutaneous Tissue) Exposed exposed. There is no tunneling or undermining noted. There is a medium  amount of serous drainage noted. The wound margin is flat and intact. There is medium (34-66%) pink granulation within the wound bed. There is a medium (34-66%) amount of necrotic tissue within the wound bed including Adherent Slough. The periwound skin appearance exhibited: Callus. The periwound skin appearance did not exhibit: Crepitus, Excoriation, Induration, Rash, Scarring, Dry/Scaly, Maceration, Atrophie Blanche, Cyanosis, Ecchymosis, Hemosiderin Staining, Mottled, Pallor, Rubor, Erythema. Periwound temperature was noted as No Abnormality. The periwound has tenderness on palpation. 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 There was a Skin/Subcutaneous Tissue Debridement ([id number]) debridement with total area of 0.04 sq cm performed by STONE III, Khi Mcmillen E., PA-C. with the following instrument(s): Curette to remove Viable and Non-Viable tissue/material including Callus and Subcutaneous after achieving pain control using Other (lidocaine 4%). A time out was  conducted at 11:12, prior to the start of the procedure. A Moderate amount of bleeding was controlled with Silver Nitrate. The procedure was tolerated well with a pain level of 0 throughout and a pain level of 0 following the procedure. Post Debridement Measurements: 0.2cm length x 0.3cm width x 0.4cm depth; 0.019cm^3 volume. Character of Wound/Ulcer Post Debridement is stable. 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 There was a Skin/Subcutaneous Tissue Debridement ([id number]) debridement with total area of 1.32 sq cm performed by STONE III, Dezirae Service E., PA-C. with the following instrument(s): Curette to remove Viable and Non-Viable tissue/material including Callus and Subcutaneous after achieving pain control using Other (lidocaine 4%). A time out was conducted at 11:12, prior to the start of the procedure. A Minimum amount of bleeding was controlled with Pressure. The procedure was tolerated well with a pain level of 0 throughout and a pain level of 0 following the procedure. Post Debridement Measurements: 1.1cm length x 1.2cm width x 0.4cm depth; 0.415cm^3 volume. Character of Wound/Ulcer Post Debridement is stable. Post procedure Diagnosis Wound #2: Same as Pre-Procedure Kessner, Alaine I. ([id number]) Wound #3 Pre-procedure diagnosis of Wound #3 is a Neuropathic Ulcer-Non Diabetic located on the Right,Plantar Metatarsal head first . There was a Skin/Subcutaneous Tissue Debridement ([id number]) debridement with total area of 0.09 sq cm performed by STONE III, Khaniyah Bezek E., PA-C. with the following instrument(s): Curette to remove Viable and Non-Viable tissue/material including Callus and Subcutaneous after achieving pain control using Other (lidocaine 4%). A time out was conducted at 11:12, prior to the start of the procedure. A Minimum amount of  bleeding was controlled with Pressure. The procedure was tolerated well with a pain level of 0 throughout and a pain level of 0 following the procedure. Post Debridement Measurements: 0.3cm length x 0.4cm width x 0.3cm depth; 0.028cm^3 volume. Character of Wound/Ulcer Post Debridement is stable. Post procedure Diagnosis Wound #3: Same as Pre-Procedure Plan Wound Cleansing: Wound #1 Left,Plantar Metatarsal head first: Clean wound with Normal Saline. May Shower, gently pat wound dry prior to applying new dressing. Wound #2 Left,Plantar Metatarsal head fifth: Clean wound with Normal Saline. May Shower, gently pat wound dry prior to applying new  dressing. Wound #3 Right,Plantar Metatarsal head first: Clean wound with Normal Saline. May Shower, gently pat wound dry prior to applying new dressing. Anesthetic (add to Medication List): Wound #1 Left,Plantar Metatarsal head first: Topical Lidocaine 4% cream applied to wound bed prior to debridement (In Clinic Only). Wound #2 Left,Plantar Metatarsal head fifth: Topical Lidocaine 4% cream applied to wound bed prior to debridement (In Clinic Only). Wound #3 Right,Plantar Metatarsal head first: Topical Lidocaine 4% cream applied to wound bed prior to debridement (In Clinic Only). Primary Wound Dressing: Wound #1 Left,Plantar Metatarsal head first: Other: - silvercell Wound #2 Left,Plantar Metatarsal head fifth: Other: - silvercell Wound #3 Right,Plantar Metatarsal head first: Other: - silvercell Secondary Dressing: Wound #1 Left,Plantar Metatarsal head first: Boardered Foam Dressing Wound #2 Left,Plantar Metatarsal head fifth: Boardered Foam Dressing Wound #3 Right,Plantar Metatarsal head first: Boardered Foam Dressing Dressing Change Frequency: Wound #1 Left,Plantar Metatarsal head first: Change dressing every other day. Wound #2 Left,Plantar Metatarsal head fifth: Change dressing every other day. Wound #3 Right,Plantar Metatarsal head  first: Change dressing every other day. Follow-up Appointments: Wound #1 Left,Plantar Metatarsal head first: DEZRA, MANDELLA I. ([id number]) Return Appointment in 1 week. Wound #2 Left,Plantar Metatarsal head fifth: Return Appointment in 1 week. Wound #3 Right,Plantar Metatarsal head first: Return Appointment in 1 week. Edema Control: Wound #1 Left,Plantar Metatarsal head first: Elevate legs to the level of the heart and pump ankles as often as possible Wound #2 Left,Plantar Metatarsal head fifth: Elevate legs to the level of the heart and pump ankles as often as possible Wound #3 Right,Plantar Metatarsal head first: Elevate legs to the level of the heart and pump ankles as often as possible Off-Loading: Wound #1 Left,Plantar Metatarsal head first: Open toe surgical shoe to: - bilateral feet - darco with peg assist Additional Orders / Instructions: Wound #1 Left,Plantar Metatarsal head first: Increase protein intake. Wound #2 Left,Plantar Metatarsal head fifth: Increase protein intake. Wound #3 Right,Plantar Metatarsal head first: Increase protein intake. The following medication(s) was prescribed: lidocaine topical 4 % cream cream topical was prescribed at facility I am going to recommend currently that we continue with the Current wound care measures for the next week including the offloading which patient has been very pleased with. She states she is definitely not hurting as bad as she was. I am going to be seeing her next week in [place] she is transferring her care there. We will see were things stand at that point. Otherwise it's been a pleasure caring for her here and I will see her there in [place] next week. Please see above for specific wound care orders. We will see patient for re-evaluation in 1 week(s) here in the clinic. If anything worsens or changes patient will contact our office for additional recommendations. Electronic Signature(s) Signed: 11/25/2017  10:02:55 PM By: Lenda Kelp PA-C Entered By: Lenda Kelp on 11/24/2017 13:21:23 [name] ([id number]) -------------------------------------------------------------------------------- ROS/PFSH Details Patient Name: [name] Date of Service: 11/24/2017 10:15 AM Medical Record Number: [id number] Patient Account Number: [id number] Date of Birth/Sex: 11/16/1950 (67 y.o. Female) Treating RN: Huel Coventry Primary Care Provider: Jarome Matin Other Clinician: Referring Provider: Jarome Matin Treating Provider/Extender: Linwood Dibbles, Slayden Mennenga Weeks in Treatment: 17 Information Obtained From Patient Wound History Do you currently have one or more open woundso Yes How many open wounds do you currently haveo 2 Approximately how long have you had your woundso 8 weeks How have you been treating your wound(s) until nowo hydrogel, Has your wound(s)  ever healed and then re-openedo No Have you had any lab work done in the past montho Yes Who ordered the lab work doneo [facility] Have you tested positive for an antibiotic resistant organism (MRSA, VRE)o No Have you tested positive for osteomyelitis (bone infection)o No Have you had any tests for circulation on your legso Yes Who ordered the testo hospital Where was the test doneo [facility] Have you had other problems associated with your woundso Swelling Constitutional Symptoms (General Health) Complaints and Symptoms: Negative for: Fever; Chills Respiratory Complaints and Symptoms: No Complaints or Symptoms Cardiovascular Complaints and Symptoms: No Complaints or Symptoms Medical History: Positive for: Arrhythmia - a fib; Hypertension Neurologic Medical History: Positive for: Neuropathy Psychiatric Complaints and Symptoms: No Complaints or Symptoms Immunizations Pneumococcal Vaccine: Received Pneumococcal Vaccination: No Forker, Rayona I. ([id number]) Implantable Devices Family and Social History Former smoker - quit  25 years ago; Marital Status - Single; Alcohol Use: Never; Drug Use: No History; Caffeine Use: Daily; Financial Concerns: No; Food, Clothing or Shelter Needs: No; Support System Lacking: No; Transportation Concerns: No; Advanced Directives: No; Patient does not want information on Advanced Directives Physician Affirmation I have reviewed and agree with the above information. Electronic Signature(s) Signed: 11/25/2017 10:02:55 PM By: Lenda Kelp PA-C Signed: 11/27/2017 10:40:22 AM By: Elliot Gurney, BSN, RN, CWS, Kim RN, BSN Entered By: Lenda Kelp on 11/24/2017 13:20:20 [name] ([id number]) -------------------------------------------------------------------------------- SuperBill Details Patient Name: [name] Date of Service: 11/24/2017 Medical Record Number: [id number] Patient Account Number: [id number] Date of Birth/Sex: 03-May-1951 (67 y.o. 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 Morbid (severe) obesity due to excess calories Facility Procedures CPT4 Code: [id number] Description: 11042 - DEB SUBQ TISSUE 20 SQ CM/< ICD-10 Diagnosis Description L97.512 Non-pressure chronic ulcer of other part of right foot with fat L97.522 Non-pressure chronic ulcer of other part of left foot with fat Modifier: layer exposed layer exposed Quantity: 1 Physician Procedures CPT4 Code: [id number] Description: 11042 - WC PHYS SUBQ TISS 20 SQ CM ICD-10 Diagnosis Description L97.512 Non-pressure chronic ulcer of other part of right foot  with fat L97.522 Non-pressure chronic ulcer of other part of left foot with fat Modifier: layer exposed layer exposed Quantity: 1 Electronic Signature(s) Signed: 11/25/2017 10:02:55 PM By: Lenda Kelp PA-C Entered By: Lenda Kelp on 11/24/2017 13:21:37

## 2017-11-29 ENCOUNTER — Encounter (HOSPITAL_BASED_OUTPATIENT_CLINIC_OR_DEPARTMENT_OTHER): Payer: PPO | Attending: Physician Assistant

## 2017-11-29 DIAGNOSIS — I10 Essential (primary) hypertension: Secondary | ICD-10-CM | POA: Insufficient documentation

## 2017-11-29 DIAGNOSIS — G603 Idiopathic progressive neuropathy: Secondary | ICD-10-CM | POA: Diagnosis not present

## 2017-11-29 DIAGNOSIS — Z6841 Body Mass Index (BMI) 40.0 and over, adult: Secondary | ICD-10-CM | POA: Diagnosis not present

## 2017-11-29 DIAGNOSIS — I739 Peripheral vascular disease, unspecified: Secondary | ICD-10-CM | POA: Diagnosis not present

## 2017-11-29 DIAGNOSIS — L97512 Non-pressure chronic ulcer of other part of right foot with fat layer exposed: Secondary | ICD-10-CM | POA: Insufficient documentation

## 2017-11-29 DIAGNOSIS — L97822 Non-pressure chronic ulcer of other part of left lower leg with fat layer exposed: Secondary | ICD-10-CM | POA: Diagnosis not present

## 2017-11-29 DIAGNOSIS — L97522 Non-pressure chronic ulcer of other part of left foot with fat layer exposed: Secondary | ICD-10-CM | POA: Diagnosis not present

## 2017-11-29 DIAGNOSIS — Z87891 Personal history of nicotine dependence: Secondary | ICD-10-CM | POA: Diagnosis not present

## 2017-12-06 ENCOUNTER — Other Ambulatory Visit (HOSPITAL_COMMUNITY)
Admission: RE | Admit: 2017-12-06 | Discharge: 2017-12-06 | Disposition: A | Discharge status: Home / Self Care | Payer: PPO | Source: Other Acute Inpatient Hospital | Attending: Physician Assistant | Admitting: Physician Assistant

## 2017-12-06 DIAGNOSIS — G603 Idiopathic progressive neuropathy: Secondary | ICD-10-CM | POA: Diagnosis not present

## 2017-12-06 DIAGNOSIS — L97512 Non-pressure chronic ulcer of other part of right foot with fat layer exposed: Secondary | ICD-10-CM | POA: Insufficient documentation

## 2017-12-06 DIAGNOSIS — L97522 Non-pressure chronic ulcer of other part of left foot with fat layer exposed: Secondary | ICD-10-CM | POA: Diagnosis not present

## 2017-12-07 DIAGNOSIS — L97529 Non-pressure chronic ulcer of other part of left foot with unspecified severity: Secondary | ICD-10-CM | POA: Diagnosis not present

## 2017-12-08 ENCOUNTER — Ambulatory Visit
Admission: RE | Admit: 2017-12-08 | Discharge: 2017-12-08 | Disposition: A | Discharge status: Home / Self Care | Payer: PPO | Source: Ambulatory Visit | Attending: Physician Assistant | Admitting: Physician Assistant

## 2017-12-08 DIAGNOSIS — L97522 Non-pressure chronic ulcer of other part of left foot with fat layer exposed: Secondary | ICD-10-CM

## 2017-12-08 DIAGNOSIS — R600 Localized edema: Secondary | ICD-10-CM | POA: Diagnosis not present

## 2017-12-08 MED ORDER — GADOBENATE DIMEGLUMINE 529 MG/ML IV SOLN
20.0000 mL | Freq: Once | INTRAVENOUS | Status: AC | PRN
  Administered 2017-12-08: 20 mL via INTRAVENOUS

## 2017-12-10 DIAGNOSIS — L0390 Cellulitis, unspecified: Secondary | ICD-10-CM | POA: Diagnosis not present

## 2017-12-11 LAB — AEROBIC CULTURE  (SUPERFICIAL SPECIMEN)

## 2017-12-11 LAB — AEROBIC CULTURE W GRAM STAIN (SUPERFICIAL SPECIMEN)

## 2017-12-15 DIAGNOSIS — L97512 Non-pressure chronic ulcer of other part of right foot with fat layer exposed: Secondary | ICD-10-CM | POA: Diagnosis not present

## 2017-12-15 DIAGNOSIS — L97522 Non-pressure chronic ulcer of other part of left foot with fat layer exposed: Secondary | ICD-10-CM | POA: Diagnosis not present

## 2017-12-15 DIAGNOSIS — G603 Idiopathic progressive neuropathy: Secondary | ICD-10-CM | POA: Diagnosis not present

## 2017-12-17 DIAGNOSIS — G603 Idiopathic progressive neuropathy: Secondary | ICD-10-CM | POA: Diagnosis not present

## 2017-12-17 DIAGNOSIS — Z5181 Encounter for therapeutic drug level monitoring: Secondary | ICD-10-CM | POA: Diagnosis not present

## 2017-12-17 DIAGNOSIS — I7389 Other specified peripheral vascular diseases: Secondary | ICD-10-CM | POA: Diagnosis not present

## 2017-12-17 DIAGNOSIS — Z7901 Long term (current) use of anticoagulants: Secondary | ICD-10-CM | POA: Diagnosis not present

## 2017-12-17 DIAGNOSIS — I10 Essential (primary) hypertension: Secondary | ICD-10-CM | POA: Diagnosis not present

## 2017-12-17 DIAGNOSIS — Z4800 Encounter for change or removal of nonsurgical wound dressing: Secondary | ICD-10-CM | POA: Diagnosis not present

## 2017-12-17 DIAGNOSIS — L02611 Cutaneous abscess of right foot: Secondary | ICD-10-CM | POA: Diagnosis not present

## 2017-12-17 DIAGNOSIS — I739 Peripheral vascular disease, unspecified: Secondary | ICD-10-CM | POA: Diagnosis not present

## 2017-12-17 DIAGNOSIS — L97512 Non-pressure chronic ulcer of other part of right foot with fat layer exposed: Secondary | ICD-10-CM | POA: Diagnosis not present

## 2017-12-17 DIAGNOSIS — Z87891 Personal history of nicotine dependence: Secondary | ICD-10-CM | POA: Diagnosis not present

## 2017-12-17 DIAGNOSIS — Z452 Encounter for adjustment and management of vascular access device: Secondary | ICD-10-CM | POA: Diagnosis not present

## 2017-12-17 DIAGNOSIS — L97522 Non-pressure chronic ulcer of other part of left foot with fat layer exposed: Secondary | ICD-10-CM | POA: Diagnosis not present

## 2017-12-18 ENCOUNTER — Encounter: Payer: Self-pay | Admitting: Surgery

## 2017-12-18 ENCOUNTER — Other Ambulatory Visit: Payer: Self-pay

## 2017-12-18 ENCOUNTER — Ambulatory Visit (INDEPENDENT_AMBULATORY_CARE_PROVIDER_SITE_OTHER): Payer: PPO | Admitting: Surgery

## 2017-12-18 VITALS — BP 117/72 | HR 60 | Temp 98.6°F | Resp 20 | Ht 64.0 in | Wt 256.0 lb

## 2017-12-18 DIAGNOSIS — I7025 Atherosclerosis of native arteries of other extremities with ulceration: Secondary | ICD-10-CM | POA: Diagnosis not present

## 2017-12-18 NOTE — Progress Notes (Signed)
[facility] [place]  Patient name: [name] MRN: [id number] DOB: 03/11/1951 Sex: female   REASON FOR VISIT:    Follow up  HISOTRY OF PRESENT ILLNESS:    [name] is a 67 y.o. female who underwent angiography on 10/10/2017 for left foot ulcer which is been present for several years.  She was found to have a high-grade left common iliac lesion which was stented.  The right-sided vasculature was widely patent.  She is back today for follow-up.  In the interim, she is developed a wound on her right foot and has persistent wound on her left.  She has been treated for cellulitis in the right side.   The patient suffers from paroxysmal atrial fibrillation which is managed with anticoagulation.  She does report having had a TIA like symptom before starting anticoagulation.  She had a nuclear stress test in 2016 which was low risk.  She is a former smoker she is medically managed for hypertension which is controlled.  She has a history of bipolar disorder.   PAST MEDICAL HISTORY:   Past Medical History:  Diagnosis Date  . Anxiety   . 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  PT ALSO HAS LOWER BACK PAIN AT TIMES  . Depression   . GERD (gastroesophageal reflux disease)    RARE- OTC ANTACID IF NEEDED--PAST HX OF ULCER  . Headache    wakes up with headache   . Hyperlipidemia   . Hypertension   . Kidney stones    PT PASSED STONES--NO KNOWN STONES AT PRESENT TIMES  . Lumbago 10/02/2013  . Obesity   . OSA (obstructive sleep apnea) 12/30/2013   Very mild OSA with REM accentuation,  tested 11-29-13 at [facility] sleep, Dr Frances FurbishAthar .   Marland Kitchen. Periodic limb movement disorder (PLMD) 12/30/2013  . Restless leg syndrome   . Ulcer   . Urinary incontinence      FAMILY HISTORY:   Family History  Problem Relation Age of Onset  . Sleep apnea Father   . Hypertension Father   . Hypertension Mother   . Multiple sclerosis Sister    . Multiple sclerosis Sister     SOCIAL HISTORY:   Social History   Tobacco Use  . Smoking status: Former Smoker    Packs/day: 2.00    Years: 20.00    Pack years: 40.00    Types: Cigarettes    Last attempt to quit: 10/17/1988    Years since quitting: 29.1  . Smokeless tobacco: Never Used  Substance Use Topics  . Alcohol use: No     ALLERGIES:   Allergies  Allergen Reactions  . Zolpidem Tartrate Other (See Comments)    hallucinations  . Amoxicillin Rash and Other (See Comments)    Has patient had a PCN reaction causing immediate rash, facial/tongue/throat swelling, SOB or lightheadedness with hypotension: Yes Has patient had a PCN reaction causing severe rash involving mucus membranes or skin necrosis: No Has patient had a PCN reaction that required hospitalization: No Has patient had a PCN reaction occurring within the last 10 years: Yes If all of the above answers are "NO", then may proceed with Cephalosporin use.      CURRENT MEDICATIONS:   Current Outpatient Medications  Medication Sig Dispense Refill  . acetaminophen (TYLENOL) 325 MG tablet Take 325-650 mg by mouth every 6 (six) hours as needed for mild pain or headache.     Marland Kitchen. amLODipine (NORVASC) 10  MG tablet TAKE 1 TABLET(10 MG) BY MOUTH DAILY 30 tablet 11  . buPROPion (WELLBUTRIN XL) 150 MG 24 hr tablet Take 300 mg by mouth daily.     . carvedilol (COREG) 6.25 MG tablet Take 1 tablet (6.25 mg total) by mouth 2 (two) times daily. 180 tablet 3  . cyanocobalamin (,VITAMIN B-12,) 1000 MCG/ML injection Inject 1,000 mcg into the muscle every 30 (thirty) days.    Marland Kitchen dofetilide (TIKOSYN) 250 MCG capsule TAKE 1 CAPSULE(250 MCG) BY MOUTH TWICE DAILY 60 capsule 10  . ELIQUIS 5 MG TABS tablet Take 5 mg by mouth 2 (two) times daily.    Marland Kitchen esomeprazole (NEXIUM) 20 MG capsule Take 20 mg by mouth daily at 12 noon.    . gabapentin (NEURONTIN) 600 MG tablet Take 600 mg by mouth 4 (four) times daily.    . hydrALAZINE (APRESOLINE)  50 MG tablet Take 1.5 tablets (75 mg total) by mouth 3 (three) times daily. 135 tablet 3  . iron polysaccharides (NIFEREX) 150 MG capsule Take 150 mg by mouth daily.    Marland Kitchen lisinopril (PRINIVIL,ZESTRIL) 40 MG tablet Take 40 mg by mouth daily.    . mirtazapine (REMERON) 15 MG tablet Take 15 mg by mouth at bedtime.    . Multiple Vitamins-Minerals (MULTIVITAMIN & MINERAL PO) Take 1 tablet by mouth daily.    . fluticasone (FLONASE) 50 MCG/ACT nasal spray Place 1 spray into both nostrils daily as needed for allergies.     . pantoprazole (PROTONIX) 40 MG tablet One pill(40mg ) 30 minutes before breakfast and one pill 30 minutes before dinner.    . pravastatin (PRAVACHOL) 40 MG tablet Take 40 mg by mouth once.     No current facility-administered medications for this visit.     REVIEW OF SYSTEMS:   [X]  denotes positive finding, [ ]  denotes negative finding Cardiac  Comments:  Chest pain or chest pressure:    Shortness of breath upon exertion:    Short of breath when lying flat:    Irregular heart rhythm:        Vascular    Pain in calf, thigh, or hip brought on by ambulation:    Pain in feet at night that wakes you up from your sleep:     Blood clot in your veins:    Leg swelling:         Pulmonary    Oxygen at home:    Productive cough:     Wheezing:         Neurologic    Sudden weakness in arms or legs:     Sudden numbness in arms or legs:     Sudden onset of difficulty speaking or slurred speech:    Temporary loss of vision in one eye:     Problems with dizziness:         Gastrointestinal    Blood in stool:     Vomited blood:         Genitourinary    Burning when urinating:     Blood in urine:        Psychiatric    Major depression:         Hematologic    Bleeding problems:    Problems with blood clotting too easily:        Skin    Rashes or ulcers: x       Constitutional    Fever or chills:      PHYSICAL EXAM:   Vitals:  12/18/17 1511  BP: 117/72  Pulse:  60  Resp: 20  Temp: 98.6 F (37 C)  TempSrc: Oral  SpO2: 97%  Weight: 256 lb (116.1 kg)  Height: 5\' 4"  (1.626 m)    GENERAL: The patient is a well-nourished female, in no acute distress. The vital signs are documented above. CARDIAC: There is a regular rate and rhythm.  VASCULAR: Bilateral edema PULMONARY: Non-labored respirations MUSCULOSKELETAL: There are no major deformities or cyanosis. NEUROLOGIC: No focal weakness or paresthesias are detected. SKIN: 2 mm punched out ulcer on the left and right foot.  There is cellulitis on the right PSYCHIATRIC: The patient has a normal affect.  STUDIES:   I have ordered and reviewed her vascular lab studies.  Her ABIs are within normal limits bilaterally.  The stent in the left iliac is widely patent  MEDICAL ISSUES:   I discussed with the patient that her blood flow is as good as we can get it.  She has normal ankle-brachial indices and no residual stenosis bilaterally.  She should have adequate blood supply to heal her wounds.  I will have her follow-up in 3 months with repeat vascular lab studies.    Durene Cal, MD [facility] [phone] Pager [phone]

## 2017-12-19 ENCOUNTER — Other Ambulatory Visit (HOSPITAL_COMMUNITY): Payer: Self-pay | Admitting: *Deleted

## 2017-12-19 ENCOUNTER — Ambulatory Visit (HOSPITAL_COMMUNITY)
Admission: RE | Admit: 2017-12-19 | Discharge: 2017-12-19 | Disposition: A | Discharge status: Home / Self Care | Payer: PPO | Source: Ambulatory Visit | Attending: Nurse Practitioner | Admitting: Nurse Practitioner

## 2017-12-19 ENCOUNTER — Other Ambulatory Visit: Payer: Self-pay | Admitting: Nurse Practitioner

## 2017-12-19 ENCOUNTER — Encounter: Payer: Self-pay | Admitting: Nurse Practitioner

## 2017-12-19 ENCOUNTER — Ambulatory Visit: Payer: PPO | Admitting: Nurse Practitioner

## 2017-12-19 VITALS — BP 100/70 | Ht 64.0 in | Wt 258.0 lb

## 2017-12-19 DIAGNOSIS — Z7901 Long term (current) use of anticoagulants: Secondary | ICD-10-CM

## 2017-12-19 DIAGNOSIS — I480 Paroxysmal atrial fibrillation: Secondary | ICD-10-CM | POA: Diagnosis not present

## 2017-12-19 DIAGNOSIS — Z79899 Other long term (current) drug therapy: Secondary | ICD-10-CM | POA: Diagnosis not present

## 2017-12-19 DIAGNOSIS — Z5181 Encounter for therapeutic drug level monitoring: Secondary | ICD-10-CM

## 2017-12-19 DIAGNOSIS — I10 Essential (primary) hypertension: Secondary | ICD-10-CM | POA: Diagnosis not present

## 2017-12-19 DIAGNOSIS — M7989 Other specified soft tissue disorders: Secondary | ICD-10-CM | POA: Diagnosis not present

## 2017-12-19 DIAGNOSIS — M86171 Other acute osteomyelitis, right ankle and foot: Secondary | ICD-10-CM

## 2017-12-19 DIAGNOSIS — L97511 Non-pressure chronic ulcer of other part of right foot limited to breakdown of skin: Secondary | ICD-10-CM | POA: Diagnosis not present

## 2017-12-19 LAB — HEPATIC FUNCTION PANEL
ALT: 15 IU/L (ref 0–32)
AST: 14 IU/L (ref 0–40)
Albumin: 3.5 g/dL — ABNORMAL LOW (ref 3.6–4.8)
Alkaline Phosphatase: 125 IU/L — ABNORMAL HIGH (ref 39–117)
Bilirubin Total: 0.3 mg/dL (ref 0.0–1.2)
Bilirubin, Direct: 0.1 mg/dL (ref 0.00–0.40)
Total Protein: 6.9 g/dL (ref 6.0–8.5)

## 2017-12-19 LAB — BASIC METABOLIC PANEL
BUN/Creatinine Ratio: 19 (ref 12–28)
BUN: 25 mg/dL (ref 8–27)
CO2: 18 mmol/L — ABNORMAL LOW (ref 20–29)
Calcium: 9.1 mg/dL (ref 8.7–10.3)
Chloride: 107 mmol/L — ABNORMAL HIGH (ref 96–106)
Creatinine, Ser: 1.34 mg/dL — ABNORMAL HIGH (ref 0.57–1.00)
GFR calc Af Amer: 48 mL/min/{1.73_m2} — ABNORMAL LOW (ref >59)
GFR calc non Af Amer: 41 mL/min/{1.73_m2} — ABNORMAL LOW (ref >59)
Glucose: 107 mg/dL — ABNORMAL HIGH (ref 65–99)
Potassium: 4.8 mmol/L (ref 3.5–5.2)
Sodium: 141 mmol/L (ref 134–144)

## 2017-12-19 LAB — MAGNESIUM: Magnesium: 1.6 mg/dL (ref 1.6–2.3)

## 2017-12-19 LAB — CBC
Hematocrit: 33.3 % — ABNORMAL LOW (ref 34.0–46.6)
Hemoglobin: 10 g/dL — ABNORMAL LOW (ref 11.1–15.9)
MCH: 25.1 pg — ABNORMAL LOW (ref 26.6–33.0)
MCHC: 30 g/dL — ABNORMAL LOW (ref 31.5–35.7)
MCV: 84 fL (ref 79–97)
Platelets: 514 10*3/uL — ABNORMAL HIGH (ref 150–379)
RBC: 3.99 x10E6/uL (ref 3.77–5.28)
RDW: 16.3 % — ABNORMAL HIGH (ref 12.3–15.4)
WBC: 11.4 10*3/uL — ABNORMAL HIGH (ref 3.4–10.8)

## 2017-12-19 MED ORDER — AMLODIPINE BESYLATE 5 MG PO TABS: 5.0000 mg | ORAL_TABLET | Freq: Every day | ORAL | 6 refills | Status: DC

## 2017-12-19 NOTE — Progress Notes (Signed)
CARDIOLOGY OFFICE NOTE  Date:  12/19/2017    [name] Date of Birth: [birth year] Medical Record #[id number]  PCP:  Jarome Matin, MD  Cardiologist:  Tyrone Sage [facility]   Chief Complaint  Patient presents with  . Atrial Fibrillation    Follow up visit - seen for Dr. Rosine Abe    History of Present Illness: [name] is a 67 y.o. female who presents today for a follow up visit. Seen for Dr. Anne Fu.   She has had atrial fibrillation in the setting of HTN heart disease and morbid obesity. She has had symptomatic atrial fib for over a year which has been paroxysmal at times and persistent at times. She has had RVR and a slow VR and with medical therapy has also had sinus bradycardia. She has never had syncope. Other issues include HTN, HLD, & venous insufficiency and is prone to developing ulcers on her legs. 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   Referred to EP and saw Dr. Ladona Ridgel back in May of 2018 - Tikosyn was initiated. She had to stop her Asenapine, HCTZ and her Verapamil.   Several phone calls back over the summer noting elevated BP. Hydralazine was increased. Imdur started. Coreg also increased. She ended up in the ER with a migraine and elevated BP and we then stopped the Imdur and added nighttime Norvasc.    I saw her  back in July - Hydralazine was increased for elevated BP and she was noted to be having lots of anxieties. Dr. Ladona Ridgel thought it would be ok to go back on Clonazepam. Admitted in September with cellulitis.   Last seen by me back in October - lots of anxiety - her cat had died. She was otherwise felt to be stable. But then had to have her Tikosyn cut back due to prolonged QT. Coreg has been cut back due to low BP as well. She stopped metoprolol previously because of hair loss.  Last seen by Dr. Ladona Ridgel in November. Felt to be doing ok.  .  Comes in today. Here  alone. She feels ok but has had a lot going on. Has developed foot ulcers - going to the wound center - found to have high grade left common iliac stenosis - this was stented by Dr. Myra Gianotti. Still being treated for osteo. BP running low. She notes considerable dry mouth - wanting to know what medicine she can change. She is not going to be able to afford Tikosyn now and not Eliquis as well due to her fixed income. No chest pain. Breathing is ok but she does get short of breath pretty easily with walking. No syncope. Looks like Bp has been running pretty soft recently.    Past Medical History:  Diagnosis Date  . Anxiety   . 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  PT ALSO HAS LOWER BACK PAIN AT TIMES  . Depression   . GERD (gastroesophageal reflux disease)    RARE- OTC ANTACID IF NEEDED--PAST HX OF ULCER  . Headache    wakes up with headache   . Hyperlipidemia   . Hypertension   . Kidney stones    PT PASSED STONES--NO KNOWN STONES AT PRESENT TIMES  . Lumbago 10/02/2013  . Obesity   . OSA (obstructive sleep apnea) 12/30/2013   Very mild OSA with REM accentuation,  tested 11-29-13 at [facility] sleep, Dr Frances Furbish .   Marland Kitchen  Periodic limb movement disorder (PLMD) 12/30/2013  . Restless leg syndrome   . Ulcer   . Urinary incontinence     Past Surgical History:  Procedure Laterality Date  . ABDOMINAL AORTOGRAM W/LOWER EXTREMITY N/A 10/10/2017   Procedure: ABDOMINAL AORTOGRAM W/LOWER EXTREMITY;  Surgeon: Nada LibmanBrabham, Vance W, MD;  Location: [facility] INVASIVE CV LAB;  Service: Cardiovascular;  Laterality: N/A;  . BILATERAL BUNIONECTOMY 1979    . BREAST SURGERY     biopsy   . COLON SURGERY    . PERIPHERAL VASCULAR INTERVENTION  10/10/2017   Procedure: PERIPHERAL VASCULAR INTERVENTION;  Surgeon: Nada LibmanBrabham, Vance W, MD;  Location: [facility] INVASIVE CV LAB;  Service: Cardiovascular;;  lt. Iliac  . SMALL INTESTINE SURGERY    . SURGERY FOR BOWEL OBSTRUCTION AND REMOVAL OF PART OF STOMACH  IN 2000  2000  . TOTAL KNEE  ARTHROPLASTY  02/16/2012   Procedure: TOTAL KNEE ARTHROPLASTY;  Surgeon: Javier DockerJeffrey C Beane, MD;  Location: [facility] ORS;  Service: Orthopedics;  Laterality: Left;  . TOTAL KNEE ARTHROPLASTY Right 11/20/2014   Procedure: RIGHT TOTAL KNEE ARTHROPLASTY;  Surgeon: Javier DockerJeffrey C Beane, MD;  Location: [facility] ORS;  Service: Orthopedics;  Laterality: Right;     Medications: Current Meds  Medication Sig  . acetaminophen (TYLENOL) 325 MG tablet Take 325-650 mg by mouth every 6 (six) hours as needed for mild pain or headache.   Marland Kitchen. buPROPion (WELLBUTRIN XL) 150 MG 24 hr tablet Take 300 mg by mouth daily.   . carvedilol (COREG) 6.25 MG tablet Take 1 tablet (6.25 mg total) by mouth 2 (two) times daily.  . cyanocobalamin (,VITAMIN B-12,) 1000 MCG/ML injection Inject 1,000 mcg into the muscle every 30 (thirty) days.  Marland Kitchen. dofetilide (TIKOSYN) 250 MCG capsule TAKE 1 CAPSULE(250 MCG) BY MOUTH TWICE DAILY  . ELIQUIS 5 MG TABS tablet Take 5 mg by mouth 2 (two) times daily.  Marland Kitchen. esomeprazole (NEXIUM) 20 MG capsule Take 20 mg by mouth daily at 12 noon.  . fluticasone (FLONASE) 50 MCG/ACT nasal spray Place 1 spray into both nostrils daily as needed for allergies.   Marland Kitchen. gabapentin (NEURONTIN) 600 MG tablet Take 600 mg by mouth 4 (four) times daily.  . hydrALAZINE (APRESOLINE) 50 MG tablet Take 1.5 tablets (75 mg total) by mouth 3 (three) times daily.  . iron polysaccharides (NIFEREX) 150 MG capsule Take 150 mg by mouth daily.  Marland Kitchen. lisinopril (PRINIVIL,ZESTRIL) 40 MG tablet Take 40 mg by mouth daily.  . mirtazapine (REMERON) 15 MG tablet Take 15 mg by mouth at bedtime.  . Multiple Vitamins-Minerals (MULTIVITAMIN & MINERAL PO) Take 1 tablet by mouth daily.  . pravastatin (PRAVACHOL) 40 MG tablet Take 40 mg by mouth once.  . [DISCONTINUED] amLODipine (NORVASC) 10 MG tablet TAKE 1 TABLET(10 MG) BY MOUTH DAILY     Allergies: Allergies  Allergen Reactions  . Zolpidem Tartrate Other (See Comments)    hallucinations  . Amoxicillin Rash and  Other (See Comments)    Has patient had a PCN reaction causing immediate rash, facial/tongue/throat swelling, SOB or lightheadedness with hypotension: Yes Has patient had a PCN reaction causing severe rash involving mucus membranes or skin necrosis: No Has patient had a PCN reaction that required hospitalization: No Has patient had a PCN reaction occurring within the last 10 years: Yes If all of the above answers are "NO", then may proceed with Cephalosporin use.     Social History: The patient  reports that she quit smoking about 29 years ago. Her smoking use included cigarettes.  She has a 40.00 pack-year smoking history. She has never used smokeless tobacco. She reports that she does not drink alcohol or use drugs.   Family History: The patient's family history includes Hypertension in her father and mother; Multiple sclerosis in her sister and sister; Sleep apnea in her father.   Review of Systems: Please see the history of present illness.   Otherwise, the review of systems is positive for none.   All other systems are reviewed and negative.   Physical Exam: VS:  BP 100/70 (BP Location: Left Arm, Patient Position: Sitting, Cuff Size: Normal)   Ht 5\' 4"  (1.626 m)   Wt 258 lb (117 kg) Comment: pt stated weight  LMP  (LMP Unknown)   BMI 44.29 kg/m  .  BMI Body mass index is 44.29 kg/m.  Wt Readings from Last 3 Encounters:  12/19/17 258 lb (117 kg)  12/18/17 256 lb (116.1 kg)  10/10/17 265 lb (120.2 kg)    General: Pleasant. Obese. Alert and in no acute distress.   HEENT: Normal.  Neck: Supple, no JVD, carotid bruits, or masses noted.  Cardiac: Regular rate and rhythm.Soft outflow murmur. Both feet with bandages. Respiratory:  Lungs are clear to auscultation bilaterally with normal work of breathing.  GI: Soft and nontender.  MS: No deformity or atrophy. Gait and ROM intact. Using a cane.  Skin: Warm and dry. Color is normal.  Neuro:  Strength and sensation are intact and no  gross focal deficits noted.  Psych: Alert, appropriate and with normal affect.   LABORATORY DATA:  EKG:  EKG is ordered today. This demonstrates sinus bradycardia - her QT is ok at 474 and QTc 469.  Lab Results  Component Value Date   WBC 8.8 10/11/2017   HGB 8.8 (L) 10/11/2017   HCT 29.2 (L) 10/11/2017   PLT 318 10/11/2017   GLUCOSE 140 (H) 10/11/2017   ALT 14 03/22/2017   AST 20 03/22/2017   NA 136 10/11/2017   K 3.9 10/11/2017   CL 105 10/11/2017   CREATININE 0.99 10/11/2017   BUN 11 10/11/2017   CO2 20 (L) 10/11/2017   TSH 1.367 11/24/2014   INR 1.11 03/22/2017   HGBA1C 5.6 06/06/2017       BNP (last 3 results) No results for input(s): BNP in the last 8760 hours.  ProBNP (last 3 results) No results for input(s): PROBNP in the last 8760 hours.   Other Studies Reviewed Today:  Myoview Study Highlights 2016   Nuclear stress EF: 60%.  There was no ST segment deviation noted during stress.  Defect 1: There is a small defect of mild severity present in the mid anterior and apical anterior location. This is likely breast attenuation, but cannot rule out infarct with mild peri-infarct ischemia.  The study is normal.  This is a low risk study.  The left ventricular ejection fraction is normal (55-65%).    Echo Study Conclusions 2016  - Left ventricle: The cavity size was normal. Systolic function was normal. The estimated ejection fraction was in the range of 50% to 55%. Wall motion was normal; there were no regional wall motion abnormalities. - Mitral valve: There was mild regurgitation. - Left atrium: The atrium was mildly dilated. - Right atrium: The atrium was mildly dilated.  Assessment/Plan:  1. PAF - on Tikosyn - was put on lower dose due to prior prolonged QT on EKG - EKG ok today. She says she is not going to be able to afford long  term. Seeing Dr. Ladona Ridgel tomorrow for discussion. Lab today.   2. HTN - BP quite soft - will cut  Norvasc in half to 5 mg a day.   3. Chronic anticoagulation - having issues with affording Eliquis long term - doubtful that switching to Coumadin will be any better in light of copays. Will see if the refill/PA dept here can help in any way. Samples of Eliquis today. No active bleeding noted. Lab today.   4. Morbid obesity  5. Chronic venous insufficiency  6. Anxiety - chronic. Not addressed today.   7. Bilateral foot ulcers - osteo - followed in the wound clinic  8. PVD - recent stent placement per Dr. Myra Gianotti.   9. Dry mouth - probably from all of her medicines. Cutting Norvasc back today.   10. Murmur - probably needs an echo - she is not wanting to do this at this time. No cardinal symptoms.   11. Anemia - rechecking lab today   Current medicines are reviewed with the patient today.  The patient does not have concerns regarding medicines other than what has been noted above.  The following changes have been made:  See above.  Labs/ tests ordered today include:    Orders Placed This Encounter  Procedures  . Basic metabolic panel  . CBC  . Hepatic function panel  . Magnesium  . EKG 12-Lead     Disposition:   FU with me in 3 months.  Seeing Dr. Ladona Ridgel tomorrow.    Patient is agreeable to this plan and will call if any problems develop in the interim.   SignedNorma Fredrickson, NP  12/19/2017 10:07 AM  [facility] [address] [place], [place]  [address] Phone: [phone] Fax: [phone]

## 2017-12-19 NOTE — Patient Instructions (Addendum)
We will be checking the following labs today - BMET, CBC, HPF and Mg level   Medication Instructions:    Continue with your current medicines. BUT  I am cutting Amlodipine in half to 5 mg a day - you can cut the 10 mg in half and I have sent the RX for the 5 mg to your pharmacy.   We will see if we can get some assistance for your Eliquis and tikosyn  Samples of Eliquis if possible    Testing/Procedures To Be Arranged:  N/A  Follow-Up:   See me in 3 months  See Dr. Ladona Ridgelaylor tomorrow as planned to discuss other options for Tikosyn    Other Special Instructions:   N/A    If you need a refill on your cardiac medications before your next appointment, please call your pharmacy.   Call the [facility] office at [phone] if you have any questions, problems or concerns.

## 2017-12-20 ENCOUNTER — Encounter: Payer: Self-pay | Admitting: Internal Medicine

## 2017-12-20 ENCOUNTER — Telehealth: Payer: Self-pay | Admitting: Nurse Practitioner

## 2017-12-20 ENCOUNTER — Other Ambulatory Visit: Payer: Self-pay | Admitting: *Deleted

## 2017-12-20 ENCOUNTER — Ambulatory Visit: Payer: PPO | Admitting: Internal Medicine

## 2017-12-20 ENCOUNTER — Other Ambulatory Visit: Payer: Self-pay

## 2017-12-20 VITALS — BP 124/76 | HR 71 | Ht 64.0 in | Wt 258.0 lb

## 2017-12-20 DIAGNOSIS — G603 Idiopathic progressive neuropathy: Secondary | ICD-10-CM | POA: Diagnosis not present

## 2017-12-20 DIAGNOSIS — I10 Essential (primary) hypertension: Secondary | ICD-10-CM

## 2017-12-20 DIAGNOSIS — I480 Paroxysmal atrial fibrillation: Secondary | ICD-10-CM

## 2017-12-20 DIAGNOSIS — L97519 Non-pressure chronic ulcer of other part of right foot with unspecified severity: Secondary | ICD-10-CM

## 2017-12-20 DIAGNOSIS — I739 Peripheral vascular disease, unspecified: Secondary | ICD-10-CM

## 2017-12-20 DIAGNOSIS — L97522 Non-pressure chronic ulcer of other part of left foot with fat layer exposed: Secondary | ICD-10-CM | POA: Diagnosis not present

## 2017-12-20 DIAGNOSIS — I7025 Atherosclerosis of native arteries of other extremities with ulceration: Secondary | ICD-10-CM

## 2017-12-20 DIAGNOSIS — L97512 Non-pressure chronic ulcer of other part of right foot with fat layer exposed: Secondary | ICD-10-CM | POA: Diagnosis not present

## 2017-12-20 MED ORDER — MAGNESIUM OXIDE 400 MG PO CAPS: 400.0000 mg | ORAL_CAPSULE | Freq: Every day | ORAL | 3 refills | Status: AC

## 2017-12-20 NOTE — Patient Instructions (Addendum)

## 2017-12-20 NOTE — Telephone Encounter (Signed)
Patient calling, returning call for results. °

## 2017-12-20 NOTE — Progress Notes (Signed)
HPI [name] returns today for followup. She is a pleasant 67 yo woman with a h/o PAF who has maintained NSR on dofetilide. She has a h/o arthritis and is s/p joint surgery. She has a h/o OSA. She denis chest pain. She has had problems with osteomyelitis in her foot and has undergone revascularization of her foot. She has some dyspnea. She denies syncope.   Allergies  Allergen Reactions  . Zolpidem Tartrate Other (See Comments)    hallucinations  . Amoxicillin Rash and Other (See Comments)    Has patient had a PCN reaction causing immediate rash, facial/tongue/throat swelling, SOB or lightheadedness with hypotension: Yes Has patient had a PCN reaction causing severe rash involving mucus membranes or skin necrosis: No Has patient had a PCN reaction that required hospitalization: No Has patient had a PCN reaction occurring within the last 10 years: Yes If all of the above answers are "NO", then may proceed with Cephalosporin use.      Current Outpatient Medications  Medication Sig Dispense Refill  . acetaminophen (TYLENOL) 325 MG tablet Take 325-650 mg by mouth every 6 (six) hours as needed for mild pain or headache.     Marland Kitchen amLODipine (NORVASC) 5 MG tablet Take 1 tablet (5 mg total) by mouth daily. 30 tablet 6  . buPROPion (WELLBUTRIN XL) 150 MG 24 hr tablet Take 300 mg by mouth daily.     . carvedilol (COREG) 6.25 MG tablet Take 1 tablet (6.25 mg total) by mouth 2 (two) times daily. 180 tablet 3  . cyanocobalamin (,VITAMIN B-12,) 1000 MCG/ML injection Inject 1,000 mcg into the muscle every 30 (thirty) days.    Marland Kitchen dofetilide (TIKOSYN) 250 MCG capsule TAKE 1 CAPSULE(250 MCG) BY MOUTH TWICE DAILY 60 capsule 10  . ELIQUIS 5 MG TABS tablet Take 5 mg by mouth 2 (two) times daily.    Marland Kitchen esomeprazole (NEXIUM) 20 MG capsule Take 20 mg by mouth daily at 12 noon.    . fluticasone (FLONASE) 50 MCG/ACT nasal spray Place 1 spray into both nostrils daily as needed for allergies.     Marland Kitchen  gabapentin (NEURONTIN) 600 MG tablet Take 600 mg by mouth 4 (four) times daily.    . hydrALAZINE (APRESOLINE) 50 MG tablet Take 1.5 tablets (75 mg total) by mouth 3 (three) times daily. 135 tablet 3  . iron polysaccharides (NIFEREX) 150 MG capsule Take 150 mg by mouth daily.    Marland Kitchen lisinopril (PRINIVIL,ZESTRIL) 40 MG tablet Take 40 mg by mouth daily.    . mirtazapine (REMERON) 15 MG tablet Take 15 mg by mouth at bedtime.    . Multiple Vitamins-Minerals (MULTIVITAMIN & MINERAL PO) Take 1 tablet by mouth daily.    . pravastatin (PRAVACHOL) 40 MG tablet Take 40 mg by mouth once.     No current facility-administered medications for this visit.      Past Medical History:  Diagnosis Date  . Anxiety   . 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  PT ALSO HAS LOWER BACK PAIN AT TIMES  . Depression   . GERD (gastroesophageal reflux disease)    RARE- OTC ANTACID IF NEEDED--PAST HX OF ULCER  . Headache    wakes up with headache   . Hyperlipidemia   . Hypertension   . Kidney stones    PT PASSED STONES--NO KNOWN STONES AT PRESENT TIMES  . Lumbago 10/02/2013  . Obesity   . OSA (obstructive sleep apnea) 12/30/2013  Very mild OSA with REM accentuation,  tested 11-29-13 [facility], Dr Frances FurbishAthar .   Marland Kitchen. Periodic limb movement disorder (PLMD) 12/30/2013  . Restless leg syndrome   . Ulcer   . Urinary incontinence     ROS:   All systems reviewed and negative except as noted in the HPI.   Past Surgical History:  Procedure Laterality Date  . ABDOMINAL AORTOGRAM W/LOWER EXTREMITY N/A 10/10/2017   Procedure: ABDOMINAL AORTOGRAM W/LOWER EXTREMITY;  Surgeon: Nada LibmanBrabham, Vance W, MD;  Location: [facility];  Service: Cardiovascular;  Laterality: N/A;  . BILATERAL BUNIONECTOMY 1979    . BREAST SURGERY     biopsy   . COLON SURGERY    . PERIPHERAL VASCULAR INTERVENTION  10/10/2017   Procedure: PERIPHERAL VASCULAR INTERVENTION;  Surgeon: Nada LibmanBrabham, Vance W, MD;  Location: [facility];   Service: Cardiovascular;;  lt. Iliac  . SMALL INTESTINE SURGERY    . SURGERY FOR BOWEL OBSTRUCTION AND REMOVAL OF PART OF STOMACH  IN 2000  2000  . TOTAL KNEE ARTHROPLASTY  02/16/2012   Procedure: TOTAL KNEE ARTHROPLASTY;  Surgeon: Javier DockerJeffrey C Beane, MD;  Location: [facility];  Service: Orthopedics;  Laterality: Left;  . TOTAL KNEE ARTHROPLASTY Right 11/20/2014   Procedure: RIGHT TOTAL KNEE ARTHROPLASTY;  Surgeon: Javier DockerJeffrey C Beane, MD;  Location: [facility];  Service: Orthopedics;  Laterality: Right;     Family History  Problem Relation Age of Onset  . Sleep apnea Father   . Hypertension Father   . Hypertension Mother   . Multiple sclerosis Sister   . Multiple sclerosis Sister      Social History   Socioeconomic History  . Marital status: Single    Spouse name: Not on file  . Number of children: 0  . Years of education: 4414  . Highest education level: Not on file  Occupational History  . Not on file  Social Needs  . Financial resource strain: Not on file  . Food insecurity:    Worry: Not on file    Inability: Not on file  . Transportation needs:    Medical: Not on file    Non-medical: Not on file  Tobacco Use  . Smoking status: Former Smoker    Packs/day: 2.00    Years: 20.00    Pack years: 40.00    Types: Cigarettes    Last attempt to quit: 10/17/1988    Years since quitting: 29.1  . Smokeless tobacco: Never Used  Substance and Sexual Activity  . Alcohol use: No  . Drug use: No    Comment: Marijuana - 25 years ago  . Sexual activity: Not on file  Lifestyle  . Physical activity:    Days per week: Not on file    Minutes per session: Not on file  . Stress: Not on file  Relationships  . Social connections:    Talks on phone: Not on file    Gets together: Not on file    Attends religious service: Not on file    Active member of club or organization: Not on file    Attends meetings of clubs or organizations: Not on file    Relationship status: Not on file  . Intimate  partner violence:    Fear of current or ex partner: Not on file    Emotionally abused: Not on file    Physically abused: Not on file    Forced sexual activity: Not on file  Other Topics Concern  . Not  on file  Social History Narrative   Patient is single and lives alone.   Patient is disabled.   Patient has a college education.   Patient is right-handed.   Patient drinks two cokes per day.     BP 124/76   Pulse 71   Ht 5\' 4"  (1.626 m)   Wt 258 lb (117 kg)   LMP  (LMP Unknown)   SpO2 98%   BMI 44.29 kg/m   Physical Exam:  Chronically ill appearing NAD HEENT: Unremarkable Neck:  6 cm JVD, no thyromegally Lymphatics:  No adenopathy Back:  No CVA tenderness Lungs:  Clear with no wheezes HEART:  Regular rate rhythm, no murmurs, no rubs, no clicks Abd:  soft, positive bowel sounds, no organomegally, no rebound, no guarding Ext:  2 plus pulses, no edema, no cyanosis, no clubbing Skin:  No rashes no nodules Neuro:  CN II through XII intact, motor grossly intact  EKG - reviewed from yesterday demonstrating QTC of 470.  DEVICE  Normal device function.  See PaceArt for details.   Assess/Plan: 1. PAF - she is maintaining NSR on dofetilide. Her QT was ok on ECG yesterday. No change in meds. We are working on a way to continue her OAC. 2. Dyspnea - I suspect she has some diastolic heart failure but I am concerned about making her kidney function worse with a diuretic so I have asked her to reduce her salt intake.  3. Obesity - she is overweight and is encouraged to lose weight. 4. HTN - her blood pressure is well controlled.   Leonia Reeves.D.

## 2017-12-22 ENCOUNTER — Encounter: Payer: Self-pay | Admitting: Family

## 2017-12-22 ENCOUNTER — Ambulatory Visit (INDEPENDENT_AMBULATORY_CARE_PROVIDER_SITE_OTHER): Payer: PPO | Admitting: Family

## 2017-12-22 VITALS — Ht 65.0 in | Wt 258.0 lb

## 2017-12-22 DIAGNOSIS — M86672 Other chronic osteomyelitis, left ankle and foot: Secondary | ICD-10-CM | POA: Diagnosis not present

## 2017-12-22 DIAGNOSIS — M869 Osteomyelitis, unspecified: Secondary | ICD-10-CM | POA: Insufficient documentation

## 2017-12-22 MED ORDER — CEFUROXIME AXETIL 500 MG PO TABS: 500.0000 mg | ORAL_TABLET | Freq: Two times a day (BID) | ORAL | 0 refills | Status: DC

## 2017-12-22 NOTE — Progress Notes (Signed)
Subjective:    Patient ID: [name], female    DOB: 12/29/1950, 67 y.o.   MRN: [id number]  Chief Complaint  Patient presents with  . Osteomyelitis     HPI:  [name] is a 67 y.o. female who presents today for an office visit for osteomyelitis.    Ms. 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 Her most recent wound appeared in September 2018 and was seen in the ED with no evidence of osteomyelitis or septic joint. She was treated with Keflex at the time. [facility] and most recently started seeing improvements until she noted redness within the last couple of weeks. She was seen at Urgent Care and started on Doxycycline. During her mos recent follow up with Wound Care there was concern for worsening of the wounds of the left foot located at the base of the 5th metatarsal and 1st metatarsal. An MRI was obtained with concern for early development of osteomyelitis. On the right foot, the wound was cultured and found to grow Pasturella species. X-rays with concern for bone mineral loss with origin of osteomyelitis or gout, but uncertain. She does house foster animals, but denies being scratched or bitten recently. No systemic fevers, chills, nausea or vomiting. She continues to take the previously prescribed doxycycline. 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  Allergies  Allergen Reactions  . Zolpidem Tartrate Other (See Comments)    hallucinations  . Amoxicillin Rash and Other (See Comments)    Has patient had a PCN reaction causing immediate rash, facial/tongue/throat swelling, SOB or lightheadedness with hypotension: Yes Has patient had a PCN reaction causing severe rash involving mucus membranes or skin necrosis: No Has patient had a PCN reaction that required hospitalization: No Has patient had a PCN reaction occurring within the last 10 years: Yes If all of the  above answers are "NO", then may proceed with Cephalosporin use.       Outpatient Medications Prior to Visit  Medication Sig Dispense Refill  . amLODipine (NORVASC) 5 MG tablet Take 1 tablet (5 mg total) by mouth daily. 30 tablet 6  . buPROPion (WELLBUTRIN XL) 150 MG 24 hr tablet Take 300 mg by mouth daily.     . carvedilol (COREG) 6.25 MG tablet Take 1 tablet (6.25 mg total) by mouth 2 (two) times daily. 180 tablet 3  . cyanocobalamin (,VITAMIN B-12,) 1000 MCG/ML injection Inject 1,000 mcg into the muscle every 30 (thirty) days.    Marland Kitchen dofetilide (TIKOSYN) 250 MCG capsule TAKE 1 CAPSULE(250 MCG) BY MOUTH TWICE DAILY 60 capsule 10  . ELIQUIS 5 MG TABS tablet Take 5 mg by mouth 2 (two) times daily.    Marland Kitchen esomeprazole (NEXIUM) 20 MG capsule Take 20 mg by mouth daily at 12 noon.    . fluticasone (FLONASE) 50 MCG/ACT nasal spray Place 1 spray into both nostrils daily as needed for allergies.     Marland Kitchen gabapentin (NEURONTIN) 600 MG tablet Take 600 mg by mouth 4 (four) times daily.    . hydrALAZINE (APRESOLINE) 50 MG tablet Take 1.5 tablets (75 mg total) by mouth 3 (three) times daily. 135 tablet 3  . iron polysaccharides (NIFEREX) 150 MG capsule Take 150 mg by mouth daily.    Marland Kitchen lisinopril (PRINIVIL,ZESTRIL) 40 MG tablet Take 40 mg by mouth daily.    . Magnesium Oxide 400 MG CAPS Take  1 capsule (400 mg total) by mouth daily. 90 capsule 3  . mirtazapine (REMERON) 15 MG tablet Take 15 mg by mouth at bedtime.    . Multiple Vitamins-Minerals (MULTIVITAMIN & MINERAL PO) Take 1 tablet by mouth daily.    . pravastatin (PRAVACHOL) 40 MG tablet Take 40 mg by mouth once.    Marland Kitchen acetaminophen (TYLENOL) 325 MG tablet Take 325-650 mg by mouth every 6 (six) hours as needed for mild pain or headache.      No facility-administered medications prior to visit.      Past Medical History:  Diagnosis Date  . Anxiety   . 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  PT ALSO HAS LOWER BACK PAIN AT TIMES  .  Depression   . GERD (gastroesophageal reflux disease)    RARE- OTC ANTACID IF NEEDED--PAST HX OF ULCER  . Headache    wakes up with headache   . Hyperlipidemia   . Hypertension   . Kidney stones    PT PASSED STONES--NO KNOWN STONES AT PRESENT TIMES  . Lumbago 10/02/2013  . Obesity   . OSA (obstructive sleep apnea) 12/30/2013   Very mild OSA with REM accentuation,  tested 11-29-13 at [facility] sleep, Dr Frances Furbish .   Marland Kitchen Periodic limb movement disorder (PLMD) 12/30/2013  . Restless leg syndrome   . Ulcer   . Urinary incontinence       Past Surgical History:  Procedure Laterality Date  . ABDOMINAL AORTOGRAM W/LOWER EXTREMITY N/A 10/10/2017   Procedure: ABDOMINAL AORTOGRAM W/LOWER EXTREMITY;  Surgeon: Nada Libman, MD;  Location: [facility];  Service: Cardiovascular;  Laterality: N/A;  . BILATERAL BUNIONECTOMY 1979    . BREAST SURGERY     biopsy   . COLON SURGERY    . PERIPHERAL VASCULAR INTERVENTION  10/10/2017   Procedure: PERIPHERAL VASCULAR INTERVENTION;  Surgeon: Nada Libman, MD;  Location: [facility];  Service: Cardiovascular;;  lt. Iliac  . SMALL INTESTINE SURGERY    . SURGERY FOR BOWEL OBSTRUCTION AND REMOVAL OF PART OF STOMACH  IN 2000  2000  . TOTAL KNEE ARTHROPLASTY  02/16/2012   Procedure: TOTAL KNEE ARTHROPLASTY;  Surgeon: Javier Docker, MD;  Location: [facility];  Service: Orthopedics;  Laterality: Left;  . TOTAL KNEE ARTHROPLASTY Right 11/20/2014   Procedure: RIGHT TOTAL KNEE ARTHROPLASTY;  Surgeon: Javier Docker, MD;  Location: [facility];  Service: Orthopedics;  Laterality: Right;      Family History  Problem Relation Age of Onset  . Sleep apnea Father   . Hypertension Father   . Hypertension Mother   . Multiple sclerosis Sister   . Multiple sclerosis Sister       Social History   Socioeconomic History  . Marital status: Single    Spouse name: Not on file  . Number of children: 0  . Years of education: 48  . Highest education level: Not on  file  Occupational History  . Not on file  Social Needs  . Financial resource strain: Not on file  . Food insecurity:    Worry: Not on file    Inability: Not on file  . Transportation needs:    Medical: Not on file    Non-medical: Not on file  Tobacco Use  . Smoking status: Former Smoker    Packs/day: 2.00    Years: 20.00    Pack years: 40.00    Types: Cigarettes    Last attempt to  quit: 10/17/1988    Years since quitting: 29.2  . Smokeless tobacco: Never Used  Substance and Sexual Activity  . Alcohol use: No  . Drug use: No    Comment: Marijuana - 25 years ago  . Sexual activity: Not on file  Lifestyle  . Physical activity:    Days per week: Not on file    Minutes per session: Not on file  . Stress: Not on file  Relationships  . Social connections:    Talks on phone: Not on file    Gets together: Not on file    Attends religious service: Not on file    Active member of club or organization: Not on file    Attends meetings of clubs or organizations: Not on file    Relationship status: Not on file  . Intimate partner violence:    Fear of current or ex partner: Not on file    Emotionally abused: Not on file    Physically abused: Not on file    Forced sexual activity: Not on file  Other Topics Concern  . Not on file  Social History Narrative   Patient is single and lives alone.   Patient is disabled.   Patient has a college education.   Patient is right-handed.   Patient drinks two cokes per day.      Review of Systems  Constitutional: Negative for chills and fever.  Respiratory: Negative for chest tightness, shortness of breath and wheezing.   Cardiovascular: Negative for chest pain and leg swelling.  Skin: Positive for wound.  Neurological: Positive for numbness.       Objective:    Ht 5\' 5"  (1.651 m)   Wt 258 lb (117 kg)   LMP  (LMP Unknown)   BMI 42.93 kg/m  Nursing note and vital signs reviewed.  Physical Exam  Constitutional: She is oriented  to person, place, and time. She appears well-developed and well-nourished. No distress.  Very pleasant, seated in the chair in no distress. Surgical shoes on bilateral feet  Cardiovascular: Normal rate, regular rhythm, normal heart sounds and intact distal pulses.  Pulmonary/Chest: Effort normal and breath sounds normal.  Neurological: She is alert and oriented to person, place, and time.  Skin: Skin is warm and dry.  Left foot with wounds noted over the 5th metatarsal head and base of the first metatarsal. There is no discharge noted. There is no significant temperature increase. Sites were covered using foam dressings. Right foot with wounds remain foam dressing at the base of the first metatarsal.   Psychiatric: She has a normal mood and affect. Her behavior is normal. Judgment and thought content normal.        Assessment & Plan:   Problem List Items Addressed This Visit      Musculoskeletal and Integument   Osteomyelitis (HCC) - Primary    Concern for osteomyelitis in the setting of chronic open wounds with healing complicated by PAD and neuropathy. Discussed IV versus oral therapy. Given the condition of her wounds, recommend starting with oral therapy. Ideally would like to change to Bactrim/Ceftin, however Bactrim interacts with Tikosyn. Will continue doxycycline and add Ceftin with goal follow up in 2 weeks. If no improvements will consider IV therapy with vancomycin and ceftriaxone for empiric therapy. No systemic symptoms today. Continue to follow.       Relevant Medications   cefUROXime (CEFTIN) 500 MG tablet       I am having Eber Jonesarolyn I. Schwanke start on  cefUROXime. I am also having her maintain her gabapentin, buPROPion, iron polysaccharides, Multiple Vitamins-Minerals (MULTIVITAMIN & MINERAL PO), ELIQUIS, mirtazapine, acetaminophen, carvedilol, lisinopril, fluticasone, hydrALAZINE, cyanocobalamin, dofetilide, pravastatin, esomeprazole, amLODipine, and Magnesium  Oxide.   Meds ordered this encounter  Medications  . cefUROXime (CEFTIN) 500 MG tablet    Sig: Take 1 tablet (500 mg total) by mouth 2 (two) times daily with a meal.    Dispense:  42 tablet    Refill:  0    Order Specific Question:   Supervising Provider    Answer:   Judyann Munson [[id number]]     Follow-up: Return in about 2 weeks (around 01/05/2018), or if symptoms worsen or fail to improve.  Jeanine Luz, FNP [facility]

## 2017-12-22 NOTE — Assessment & Plan Note (Signed)
Concern for osteomyelitis in the setting of chronic open wounds with healing complicated by PAD and neuropathy. Discussed IV versus oral therapy. Given the condition of her wounds, recommend starting with oral therapy. Ideally would like to change to Bactrim/Ceftin, however Bactrim interacts with Tikosyn. Will continue doxycycline and add Ceftin with goal follow up in 2 weeks. If no improvements will consider IV therapy with vancomycin and ceftriaxone for empiric therapy. No systemic symptoms today. Continue to follow.

## 2017-12-22 NOTE — Patient Instructions (Signed)
Nice to meet you.  Continue to take you doxycycline and we will add the Ceftin.  May require IV antibiotics.  We will see you back in 2 weeks (Week of April 15th)  If you develop rash, please let us know.

## 2017-12-27 ENCOUNTER — Encounter (HOSPITAL_BASED_OUTPATIENT_CLINIC_OR_DEPARTMENT_OTHER): Payer: PPO | Attending: Physician Assistant

## 2017-12-27 DIAGNOSIS — I10 Essential (primary) hypertension: Secondary | ICD-10-CM | POA: Diagnosis not present

## 2017-12-27 DIAGNOSIS — M86172 Other acute osteomyelitis, left ankle and foot: Secondary | ICD-10-CM | POA: Diagnosis not present

## 2017-12-27 DIAGNOSIS — L97522 Non-pressure chronic ulcer of other part of left foot with fat layer exposed: Secondary | ICD-10-CM | POA: Diagnosis not present

## 2017-12-27 DIAGNOSIS — Z87891 Personal history of nicotine dependence: Secondary | ICD-10-CM | POA: Diagnosis not present

## 2017-12-27 DIAGNOSIS — L97518 Non-pressure chronic ulcer of other part of right foot with other specified severity: Secondary | ICD-10-CM | POA: Diagnosis not present

## 2017-12-27 DIAGNOSIS — L97512 Non-pressure chronic ulcer of other part of right foot with fat layer exposed: Secondary | ICD-10-CM | POA: Diagnosis not present

## 2017-12-27 DIAGNOSIS — G603 Idiopathic progressive neuropathy: Secondary | ICD-10-CM | POA: Diagnosis not present

## 2017-12-27 DIAGNOSIS — Z6841 Body Mass Index (BMI) 40.0 and over, adult: Secondary | ICD-10-CM | POA: Insufficient documentation

## 2017-12-27 DIAGNOSIS — I739 Peripheral vascular disease, unspecified: Secondary | ICD-10-CM | POA: Diagnosis not present

## 2017-12-28 ENCOUNTER — Telehealth: Payer: Self-pay | Admitting: Nurse Practitioner

## 2017-12-28 NOTE — Telephone Encounter (Signed)
New message   FYI per patient  Patient calling with concerns about dofetilide (TIKOSYN) 250 MCG capsule. Patient states she may come to office tomorrow to drop off form for assistance. No follow up call needed.

## 2018-01-01 NOTE — Progress Notes (Deleted)
Subjective:    Patient ID: [name], female    DOB: 05/23/1951, 67 y.o.   MRN: [id number]  No chief complaint on file.   HPI:  [name] is a 67 y.o. female who presents today for routine follow up of her chronic osteomyelitis of the left foot.  [name] was last seen in the office of 12/22/17 and it was decided based on her early osteomyelitis on imaging and improvement of her wound we would continue oral therapy and she was placed on a less than optimal regimen of doxycycline and Ceftin secondary to interactions with the Tikosyn that she is taking. Taking the medication as prescribed with no adverse side effects or diarrhea.   Allergies  Allergen Reactions  . Zolpidem Tartrate Other (See Comments)    hallucinations  . Amoxicillin Rash and Other (See Comments)    Has patient had a PCN reaction causing immediate rash, facial/tongue/throat swelling, SOB or lightheadedness with hypotension: Yes Has patient had a PCN reaction causing severe rash involving mucus membranes or skin necrosis: No Has patient had a PCN reaction that required hospitalization: No Has patient had a PCN reaction occurring within the last 10 years: Yes If all of the above answers are "NO", then may proceed with Cephalosporin use.       Outpatient Medications Prior to Visit  Medication Sig Dispense Refill  . acetaminophen (TYLENOL) 325 MG tablet Take 325-650 mg by mouth every 6 (six) hours as needed for mild pain or headache.     Marland Kitchen. amLODipine (NORVASC) 5 MG tablet Take 1 tablet (5 mg total) by mouth daily. 30 tablet 6  . buPROPion (WELLBUTRIN XL) 150 MG 24 hr tablet Take 300 mg by mouth daily.     . carvedilol (COREG) 6.25 MG tablet Take 1 tablet (6.25 mg total) by mouth 2 (two) times daily. 180 tablet 3  . cefUROXime (CEFTIN) 500 MG tablet Take 1 tablet (500 mg total) by mouth 2 (two) times daily with a meal. 42 tablet 0  . cyanocobalamin (,VITAMIN B-12,) 1000 MCG/ML injection Inject 1,000  mcg into the muscle every 30 (thirty) days.    Marland Kitchen. dofetilide (TIKOSYN) 250 MCG capsule TAKE 1 CAPSULE(250 MCG) BY MOUTH TWICE DAILY 60 capsule 10  . ELIQUIS 5 MG TABS tablet Take 5 mg by mouth 2 (two) times daily.    Marland Kitchen. esomeprazole (NEXIUM) 20 MG capsule Take 20 mg by mouth daily at 12 noon.    . fluticasone (FLONASE) 50 MCG/ACT nasal spray Place 1 spray into both nostrils daily as needed for allergies.     Marland Kitchen. gabapentin (NEURONTIN) 600 MG tablet Take 600 mg by mouth 4 (four) times daily.    . hydrALAZINE (APRESOLINE) 50 MG tablet Take 1.5 tablets (75 mg total) by mouth 3 (three) times daily. 135 tablet 3  . iron polysaccharides (NIFEREX) 150 MG capsule Take 150 mg by mouth daily.    Marland Kitchen. lisinopril (PRINIVIL,ZESTRIL) 40 MG tablet Take 40 mg by mouth daily.    . Magnesium Oxide 400 MG CAPS Take 1 capsule (400 mg total) by mouth daily. 90 capsule 3  . mirtazapine (REMERON) 15 MG tablet Take 15 mg by mouth at bedtime.    . Multiple Vitamins-Minerals (MULTIVITAMIN & MINERAL PO) Take 1 tablet by mouth daily.    . pravastatin (PRAVACHOL) 40 MG tablet Take 40 mg by mouth once.     No facility-administered medications prior to visit.      Past Medical History:  Diagnosis  Date  . Anxiety   . 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  PT ALSO HAS LOWER BACK PAIN AT TIMES  . Depression   . GERD (gastroesophageal reflux disease)    RARE- OTC ANTACID IF NEEDED--PAST HX OF ULCER  . Headache    wakes up with headache   . Hyperlipidemia   . Hypertension   . Kidney stones    PT PASSED STONES--NO KNOWN STONES AT PRESENT TIMES  . Lumbago 10/02/2013  . Obesity   . OSA (obstructive sleep apnea) 12/30/2013   Very mild OSA with REM accentuation,  tested 11-29-13 at [facility] sleep, Dr Frances Furbish .   Marland Kitchen Periodic limb movement disorder (PLMD) 12/30/2013  . Restless leg syndrome   . Ulcer   . Urinary incontinence       Past Surgical History:  Procedure Laterality Date  . ABDOMINAL AORTOGRAM W/LOWER  EXTREMITY N/A 10/10/2017   Procedure: ABDOMINAL AORTOGRAM W/LOWER EXTREMITY;  Surgeon: Nada Libman, MD;  Location: [facility];  Service: Cardiovascular;  Laterality: N/A;  . BILATERAL BUNIONECTOMY 1979    . BREAST SURGERY     biopsy   . COLON SURGERY    . PERIPHERAL VASCULAR INTERVENTION  10/10/2017   Procedure: PERIPHERAL VASCULAR INTERVENTION;  Surgeon: Nada Libman, MD;  Location: [facility];  Service: Cardiovascular;;  lt. Iliac  . SMALL INTESTINE SURGERY    . SURGERY FOR BOWEL OBSTRUCTION AND REMOVAL OF PART OF STOMACH  IN 2000  2000  . TOTAL KNEE ARTHROPLASTY  02/16/2012   Procedure: TOTAL KNEE ARTHROPLASTY;  Surgeon: Javier Docker, MD;  Location: [facility];  Service: Orthopedics;  Laterality: Left;  . TOTAL KNEE ARTHROPLASTY Right 11/20/2014   Procedure: RIGHT TOTAL KNEE ARTHROPLASTY;  Surgeon: Javier Docker, MD;  Location: [facility];  Service: Orthopedics;  Laterality: Right;      Family History  Problem Relation Age of Onset  . Sleep apnea Father   . Hypertension Father   . Hypertension Mother   . Multiple sclerosis Sister   . Multiple sclerosis Sister       Social History   Socioeconomic History  . Marital status: Single    Spouse name: Not on file  . Number of children: 0  . Years of education: 69  . Highest education level: Not on file  Occupational History  . Not on file  Social Needs  . Financial resource strain: Not on file  . Food insecurity:    Worry: Not on file    Inability: Not on file  . Transportation needs:    Medical: Not on file    Non-medical: Not on file  Tobacco Use  . Smoking status: Former Smoker    Packs/day: 2.00    Years: 20.00    Pack years: 40.00    Types: Cigarettes    Last attempt to quit: 10/17/1988    Years since quitting: 29.2  . Smokeless tobacco: Never Used  Substance and Sexual Activity  . Alcohol use: No  . Drug use: No    Comment: Marijuana - 25 years ago  . Sexual activity: Not on file    Lifestyle  . Physical activity:    Days per week: Not on file    Minutes per session: Not on file  . Stress: Not on file  Relationships  . Social connections:    Talks on phone: Not on file    Gets together: Not on file  Attends religious service: Not on file    Active member of club or organization: Not on file    Attends meetings of clubs or organizations: Not on file    Relationship status: Not on file  . Intimate partner violence:    Fear of current or ex partner: Not on file    Emotionally abused: Not on file    Physically abused: Not on file    Forced sexual activity: Not on file  Other Topics Concern  . Not on file  Social History Narrative   Patient is single and lives alone.   Patient is disabled.   Patient has a college education.   Patient is right-handed.   Patient drinks two cokes per day.      Review of Systems  Constitutional: Negative for chills, fever and unexpected weight change.  Respiratory: Negative for chest tightness and shortness of breath.   Cardiovascular: Negative for chest pain.  Neurological: Negative for weakness.       Objective:    LMP  (LMP Unknown)  Nursing note and vital signs reviewed.  Physical Exam  Constitutional: She is oriented to person, place, and time. She appears well-developed and well-nourished. No distress.  Cardiovascular: Normal rate, regular rhythm, normal heart sounds and intact distal pulses.  Pulmonary/Chest: Effort normal and breath sounds normal.  Musculoskeletal:  Left foot -   Neurological: She is alert and oriented to person, place, and time.  Skin: Skin is warm and dry.  Psychiatric: She has a normal mood and affect. Her behavior is normal. Judgment and thought content normal.        Assessment & Plan:   Problem List Items Addressed This Visit    None       I am having Eber Jones I. Arlen maintain her gabapentin, buPROPion, iron polysaccharides, Multiple Vitamins-Minerals (MULTIVITAMIN &  MINERAL PO), ELIQUIS, mirtazapine, acetaminophen, carvedilol, lisinopril, fluticasone, hydrALAZINE, cyanocobalamin, dofetilide, pravastatin, esomeprazole, amLODipine, Magnesium Oxide, and cefUROXime.   No orders of the defined types were placed in this encounter.    Follow-up: No follow-ups on file.  Jeanine Luz, FNP [facility]

## 2018-01-02 DIAGNOSIS — F4310 Post-traumatic stress disorder, unspecified: Secondary | ICD-10-CM | POA: Diagnosis not present

## 2018-01-02 DIAGNOSIS — F411 Generalized anxiety disorder: Secondary | ICD-10-CM | POA: Diagnosis not present

## 2018-01-02 DIAGNOSIS — F319 Bipolar disorder, unspecified: Secondary | ICD-10-CM | POA: Diagnosis not present

## 2018-01-03 ENCOUNTER — Telehealth: Payer: Self-pay | Admitting: *Deleted

## 2018-01-03 ENCOUNTER — Other Ambulatory Visit (HOSPITAL_COMMUNITY)
Admission: RE | Admit: 2018-01-03 | Discharge: 2018-01-03 | Disposition: A | Discharge status: Home / Self Care | Payer: PPO | Source: Other Acute Inpatient Hospital | Attending: Physician Assistant | Admitting: Physician Assistant

## 2018-01-03 DIAGNOSIS — G603 Idiopathic progressive neuropathy: Secondary | ICD-10-CM | POA: Diagnosis not present

## 2018-01-03 DIAGNOSIS — L03116 Cellulitis of left lower limb: Secondary | ICD-10-CM | POA: Diagnosis not present

## 2018-01-03 DIAGNOSIS — L97512 Non-pressure chronic ulcer of other part of right foot with fat layer exposed: Secondary | ICD-10-CM | POA: Diagnosis not present

## 2018-01-03 DIAGNOSIS — L97519 Non-pressure chronic ulcer of other part of right foot with unspecified severity: Secondary | ICD-10-CM | POA: Insufficient documentation

## 2018-01-03 DIAGNOSIS — L97522 Non-pressure chronic ulcer of other part of left foot with fat layer exposed: Secondary | ICD-10-CM | POA: Diagnosis not present

## 2018-01-03 DIAGNOSIS — M869 Osteomyelitis, unspecified: Secondary | ICD-10-CM | POA: Diagnosis not present

## 2018-01-03 MED ORDER — DOXYCYCLINE HYCLATE 100 MG PO CAPS: 100.0000 mg | ORAL_CAPSULE | Freq: Two times a day (BID) | ORAL | 0 refills | Status: DC

## 2018-01-03 NOTE — Telephone Encounter (Signed)
Ok to refill doxycyline. If she develops fevers, additional redness, swelling, or pain please instruct her to go to the ED as it is likely going to take several days to get IV antibiotics started.

## 2018-01-03 NOTE — Telephone Encounter (Signed)
No available appointments Thursday or Friday. Please advise.

## 2018-01-03 NOTE — Telephone Encounter (Signed)
Likely going to need the IV antibiotics. If it is significantly worse or having fevers she may need to be seen prior to Monday if there are appointments available.

## 2018-01-03 NOTE — Telephone Encounter (Signed)
Hoyt from [facility] called to report worsening of patient's right foot. Right foot has increased redness, welling, drainage and is now hot to the touch since last week.  Hoyt swabbed right foot for culture. Left stable at this time.   Patient remains on ceftin/doxycycline, but will be out of doxycycline tomorrow.  [facility] Please advise if she should be seen sooner, if she should continue oral antibiotics (will need doxy refill). Andree CossHowell, Mehreen Azizi M, RN

## 2018-01-03 NOTE — Telephone Encounter (Signed)
Patient is calling our office regarding her foot. She was told by the wound center to call our office.  It seems [facility] would like her to continue the doxycyline and follow up on Monday as planned.  She was advised to go to the emergency room if her symptoms worsen.  She is going back to wound center on Friday and they should call our office if there is concern,  Laurell Josephsammy K Tailer Volkert, RN

## 2018-01-03 NOTE — Addendum Note (Signed)
Addended by: Laurell JosephsKING, [name] K on: 01/03/2018 03:23 PM   Modules accepted: Orders

## 2018-01-06 LAB — AEROBIC CULTURE W GRAM STAIN (SUPERFICIAL SPECIMEN)

## 2018-01-06 LAB — AEROBIC CULTURE  (SUPERFICIAL SPECIMEN)

## 2018-01-07 ENCOUNTER — Emergency Department (HOSPITAL_COMMUNITY): Payer: PPO

## 2018-01-07 ENCOUNTER — Inpatient Hospital Stay (HOSPITAL_COMMUNITY)
Admission: EM | Admit: 2018-01-07 | Discharge: 2018-01-12 | DRG: 300 | Disposition: A | Discharge status: Home Health | Payer: PPO | Attending: Internal Medicine | Admitting: Internal Medicine

## 2018-01-07 ENCOUNTER — Encounter (HOSPITAL_COMMUNITY): Payer: Self-pay | Admitting: Emergency Medicine

## 2018-01-07 ENCOUNTER — Other Ambulatory Visit: Payer: Self-pay

## 2018-01-07 DIAGNOSIS — E785 Hyperlipidemia, unspecified: Secondary | ICD-10-CM | POA: Diagnosis not present

## 2018-01-07 DIAGNOSIS — Z903 Acquired absence of stomach [part of]: Secondary | ICD-10-CM

## 2018-01-07 DIAGNOSIS — Z96651 Presence of right artificial knee joint: Secondary | ICD-10-CM | POA: Diagnosis present

## 2018-01-07 DIAGNOSIS — Z888 Allergy status to other drugs, medicaments and biological substances status: Secondary | ICD-10-CM | POA: Diagnosis not present

## 2018-01-07 DIAGNOSIS — Z87891 Personal history of nicotine dependence: Secondary | ICD-10-CM

## 2018-01-07 DIAGNOSIS — D649 Anemia, unspecified: Secondary | ICD-10-CM

## 2018-01-07 DIAGNOSIS — L97518 Non-pressure chronic ulcer of other part of right foot with other specified severity: Secondary | ICD-10-CM | POA: Diagnosis not present

## 2018-01-07 DIAGNOSIS — I10 Essential (primary) hypertension: Secondary | ICD-10-CM | POA: Diagnosis not present

## 2018-01-07 DIAGNOSIS — G2581 Restless legs syndrome: Secondary | ICD-10-CM | POA: Diagnosis not present

## 2018-01-07 DIAGNOSIS — T368X5A Adverse effect of other systemic antibiotics, initial encounter: Secondary | ICD-10-CM | POA: Diagnosis not present

## 2018-01-07 DIAGNOSIS — E876 Hypokalemia: Secondary | ICD-10-CM

## 2018-01-07 DIAGNOSIS — L97526 Non-pressure chronic ulcer of other part of left foot with bone involvement without evidence of necrosis: Secondary | ICD-10-CM | POA: Diagnosis present

## 2018-01-07 DIAGNOSIS — M86171 Other acute osteomyelitis, right ankle and foot: Secondary | ICD-10-CM | POA: Diagnosis not present

## 2018-01-07 DIAGNOSIS — L97819 Non-pressure chronic ulcer of other part of right lower leg with unspecified severity: Secondary | ICD-10-CM | POA: Diagnosis not present

## 2018-01-07 DIAGNOSIS — F419 Anxiety disorder, unspecified: Secondary | ICD-10-CM | POA: Diagnosis not present

## 2018-01-07 DIAGNOSIS — L02611 Cutaneous abscess of right foot: Secondary | ICD-10-CM | POA: Diagnosis not present

## 2018-01-07 DIAGNOSIS — Z881 Allergy status to other antibiotic agents status: Secondary | ICD-10-CM | POA: Diagnosis not present

## 2018-01-07 DIAGNOSIS — E1169 Type 2 diabetes mellitus with other specified complication: Secondary | ICD-10-CM | POA: Diagnosis present

## 2018-01-07 DIAGNOSIS — L03115 Cellulitis of right lower limb: Secondary | ICD-10-CM | POA: Diagnosis not present

## 2018-01-07 DIAGNOSIS — M869 Osteomyelitis, unspecified: Secondary | ICD-10-CM | POA: Diagnosis present

## 2018-01-07 DIAGNOSIS — Z7901 Long term (current) use of anticoagulants: Secondary | ICD-10-CM | POA: Diagnosis not present

## 2018-01-07 DIAGNOSIS — F329 Major depressive disorder, single episode, unspecified: Secondary | ICD-10-CM | POA: Diagnosis not present

## 2018-01-07 DIAGNOSIS — L03116 Cellulitis of left lower limb: Secondary | ICD-10-CM | POA: Diagnosis not present

## 2018-01-07 DIAGNOSIS — L97529 Non-pressure chronic ulcer of other part of left foot with unspecified severity: Secondary | ICD-10-CM | POA: Diagnosis not present

## 2018-01-07 DIAGNOSIS — G629 Polyneuropathy, unspecified: Secondary | ICD-10-CM | POA: Diagnosis not present

## 2018-01-07 DIAGNOSIS — L97512 Non-pressure chronic ulcer of other part of right foot with fat layer exposed: Secondary | ICD-10-CM | POA: Diagnosis not present

## 2018-01-07 DIAGNOSIS — S91301A Unspecified open wound, right foot, initial encounter: Secondary | ICD-10-CM | POA: Diagnosis not present

## 2018-01-07 DIAGNOSIS — L270 Generalized skin eruption due to drugs and medicaments taken internally: Secondary | ICD-10-CM | POA: Diagnosis not present

## 2018-01-07 DIAGNOSIS — I70235 Atherosclerosis of native arteries of right leg with ulceration of other part of foot: Secondary | ICD-10-CM | POA: Diagnosis not present

## 2018-01-07 DIAGNOSIS — G603 Idiopathic progressive neuropathy: Secondary | ICD-10-CM | POA: Diagnosis not present

## 2018-01-07 DIAGNOSIS — G4733 Obstructive sleep apnea (adult) (pediatric): Secondary | ICD-10-CM | POA: Diagnosis not present

## 2018-01-07 DIAGNOSIS — L97519 Non-pressure chronic ulcer of other part of right foot with unspecified severity: Secondary | ICD-10-CM | POA: Diagnosis not present

## 2018-01-07 DIAGNOSIS — L089 Local infection of the skin and subcutaneous tissue, unspecified: Secondary | ICD-10-CM | POA: Diagnosis not present

## 2018-01-07 DIAGNOSIS — I480 Paroxysmal atrial fibrillation: Secondary | ICD-10-CM | POA: Diagnosis present

## 2018-01-07 DIAGNOSIS — Z8249 Family history of ischemic heart disease and other diseases of the circulatory system: Secondary | ICD-10-CM

## 2018-01-07 DIAGNOSIS — Z880 Allergy status to penicillin: Secondary | ICD-10-CM

## 2018-01-07 DIAGNOSIS — F32A Depression, unspecified: Secondary | ICD-10-CM

## 2018-01-07 DIAGNOSIS — L03031 Cellulitis of right toe: Secondary | ICD-10-CM | POA: Diagnosis not present

## 2018-01-07 DIAGNOSIS — I7025 Atherosclerosis of native arteries of other extremities with ulceration: Secondary | ICD-10-CM | POA: Diagnosis not present

## 2018-01-07 DIAGNOSIS — L97829 Non-pressure chronic ulcer of other part of left lower leg with unspecified severity: Secondary | ICD-10-CM | POA: Diagnosis not present

## 2018-01-07 DIAGNOSIS — L98499 Non-pressure chronic ulcer of skin of other sites with unspecified severity: Secondary | ICD-10-CM

## 2018-01-07 DIAGNOSIS — Y92230 Patient room in hospital as the place of occurrence of the external cause: Secondary | ICD-10-CM | POA: Diagnosis not present

## 2018-01-07 DIAGNOSIS — Z820 Family history of epilepsy and other diseases of the nervous system: Secondary | ICD-10-CM

## 2018-01-07 DIAGNOSIS — I739 Peripheral vascular disease, unspecified: Secondary | ICD-10-CM | POA: Diagnosis not present

## 2018-01-07 DIAGNOSIS — I70209 Unspecified atherosclerosis of native arteries of extremities, unspecified extremity: Secondary | ICD-10-CM

## 2018-01-07 DIAGNOSIS — L03119 Cellulitis of unspecified part of limb: Secondary | ICD-10-CM | POA: Diagnosis present

## 2018-01-07 DIAGNOSIS — I70269 Atherosclerosis of native arteries of extremities with gangrene, unspecified extremity: Secondary | ICD-10-CM | POA: Diagnosis not present

## 2018-01-07 LAB — I-STAT CG4 LACTIC ACID, ED: Lactic Acid, Venous: 1.8 mmol/L (ref 0.5–1.9)

## 2018-01-07 LAB — COMPREHENSIVE METABOLIC PANEL
ALT: 12 U/L — ABNORMAL LOW (ref 14–54)
ANION GAP: 12 (ref 5–15)
AST: 19 U/L (ref 15–41)
Albumin: 2.9 g/dL — ABNORMAL LOW (ref 3.5–5.0)
Alkaline Phosphatase: 117 U/L (ref 38–126)
BUN: 22 mg/dL — ABNORMAL HIGH (ref 6–20)
CHLORIDE: 107 mmol/L (ref 101–111)
CO2: 17 mmol/L — AB (ref 22–32)
Calcium: 8.9 mg/dL (ref 8.9–10.3)
Creatinine, Ser: 1.24 mg/dL — ABNORMAL HIGH (ref 0.44–1.00)
GFR calc non Af Amer: 44 mL/min — ABNORMAL LOW (ref >60)
GFR, EST AFRICAN AMERICAN: 51 mL/min — AB (ref >60)
Glucose, Bld: 106 mg/dL — ABNORMAL HIGH (ref 65–99)
POTASSIUM: 3.5 mmol/L (ref 3.5–5.1)
SODIUM: 136 mmol/L (ref 135–145)
Total Bilirubin: 0.5 mg/dL (ref 0.3–1.2)
Total Protein: 6.9 g/dL (ref 6.5–8.1)

## 2018-01-07 LAB — SEDIMENTATION RATE: Sed Rate: 63 mm/hr — ABNORMAL HIGH (ref 0–22)

## 2018-01-07 LAB — CG4 I-STAT (LACTIC ACID): Lactic Acid, Venous: 1.62 mmol/L (ref 0.5–1.9)

## 2018-01-07 LAB — CBC WITH DIFFERENTIAL/PLATELET
Basophils Absolute: 0 10*3/uL (ref 0.0–0.1)
Basophils Relative: 0 %
EOS ABS: 0 10*3/uL (ref 0.0–0.7)
Eosinophils Relative: 0 %
HEMATOCRIT: 33.2 % — AB (ref 36.0–46.0)
HEMOGLOBIN: 10 g/dL — AB (ref 12.0–15.0)
LYMPHS ABS: 1.6 10*3/uL (ref 0.7–4.0)
LYMPHS PCT: 18 %
MCH: 25.1 pg — AB (ref 26.0–34.0)
MCHC: 30.1 g/dL (ref 30.0–36.0)
MCV: 83.4 fL (ref 78.0–100.0)
MONOS PCT: 7 %
Monocytes Absolute: 0.6 10*3/uL (ref 0.1–1.0)
NEUTROS ABS: 6.4 10*3/uL (ref 1.7–7.7)
NEUTROS PCT: 75 %
Platelets: 366 10*3/uL (ref 150–400)
RBC: 3.98 MIL/uL (ref 3.87–5.11)
RDW: 17.7 % — ABNORMAL HIGH (ref 11.5–15.5)
WBC: 8.6 10*3/uL (ref 4.0–10.5)

## 2018-01-07 LAB — PROTIME-INR
INR: 1.39
PROTHROMBIN TIME: 16.9 s — AB (ref 11.4–15.2)

## 2018-01-07 LAB — C-REACTIVE PROTEIN
CRP: 0.9 mg/dL (ref <1.0)
CRP: 1.1 mg/dL — ABNORMAL HIGH (ref <1.0)

## 2018-01-07 MED ORDER — HYDRALAZINE HCL 50 MG PO TABS
75.0000 mg | ORAL_TABLET | Freq: Three times a day (TID) | ORAL | Status: DC
  Administered 2018-01-07 – 2018-01-09 (×7): 75 mg via ORAL
  Filled 2018-01-07 (×7): qty 1

## 2018-01-07 MED ORDER — AMLODIPINE BESYLATE 5 MG PO TABS
5.0000 mg | ORAL_TABLET | Freq: Every day | ORAL | Status: DC
  Administered 2018-01-07 – 2018-01-09 (×3): 5 mg via ORAL
  Filled 2018-01-07 (×3): qty 1

## 2018-01-07 MED ORDER — FLUTICASONE PROPIONATE 50 MCG/ACT NA SUSP: 1.0000 | Freq: Every day | NASAL | Status: DC | PRN

## 2018-01-07 MED ORDER — MAGNESIUM OXIDE 400 (241.3 MG) MG PO TABS
400.0000 mg | ORAL_TABLET | Freq: Every day | ORAL | Status: DC
  Administered 2018-01-08 – 2018-01-12 (×5): 400 mg via ORAL
  Filled 2018-01-07 (×6): qty 1

## 2018-01-07 MED ORDER — SODIUM CHLORIDE 0.9 % IV SOLN
1500.0000 mg | INTRAVENOUS | Status: DC
  Filled 2018-01-07: qty 1500

## 2018-01-07 MED ORDER — APIXABAN 5 MG PO TABS
5.0000 mg | ORAL_TABLET | Freq: Two times a day (BID) | ORAL | Status: DC
  Administered 2018-01-07 – 2018-01-12 (×10): 5 mg via ORAL
  Filled 2018-01-07 (×11): qty 1

## 2018-01-07 MED ORDER — MIRTAZAPINE 15 MG PO TABS
15.0000 mg | ORAL_TABLET | Freq: Every day | ORAL | Status: DC | PRN
  Administered 2018-01-10 – 2018-01-11 (×2): 15 mg via ORAL
  Filled 2018-01-07 (×2): qty 1

## 2018-01-07 MED ORDER — LISINOPRIL 40 MG PO TABS
40.0000 mg | ORAL_TABLET | Freq: Every day | ORAL | Status: DC
  Administered 2018-01-08 – 2018-01-12 (×5): 40 mg via ORAL
  Filled 2018-01-07 (×6): qty 1

## 2018-01-07 MED ORDER — LEVOFLOXACIN IN D5W 500 MG/100ML IV SOLN
500.0000 mg | INTRAVENOUS | Status: DC
  Administered 2018-01-07: 500 mg via INTRAVENOUS
  Filled 2018-01-07 (×2): qty 100

## 2018-01-07 MED ORDER — SODIUM CHLORIDE 0.9 % IV SOLN
INTRAVENOUS | Status: AC
  Administered 2018-01-07: 19:00:00 via INTRAVENOUS

## 2018-01-07 MED ORDER — ACETAMINOPHEN 325 MG PO TABS
650.0000 mg | ORAL_TABLET | Freq: Four times a day (QID) | ORAL | Status: DC | PRN
  Administered 2018-01-07 – 2018-01-12 (×8): 650 mg via ORAL
  Filled 2018-01-07 (×8): qty 2

## 2018-01-07 MED ORDER — SODIUM CHLORIDE 0.9 % IV SOLN
2000.0000 mg | Freq: Once | INTRAVENOUS | Status: AC
  Administered 2018-01-07: 2000 mg via INTRAVENOUS
  Filled 2018-01-07: qty 2000

## 2018-01-07 MED ORDER — VORTIOXETINE HBR 5 MG PO TABS
5.0000 mg | ORAL_TABLET | Freq: Every day | ORAL | Status: DC
  Administered 2018-01-07 – 2018-01-08 (×2): 5 mg via ORAL
  Filled 2018-01-07 (×3): qty 1

## 2018-01-07 MED ORDER — BUPROPION HCL ER (XL) 150 MG PO TB24
150.0000 mg | ORAL_TABLET | Freq: Every day | ORAL | Status: DC
  Administered 2018-01-08 – 2018-01-12 (×5): 150 mg via ORAL
  Filled 2018-01-07 (×6): qty 1

## 2018-01-07 MED ORDER — PRAVASTATIN SODIUM 40 MG PO TABS
40.0000 mg | ORAL_TABLET | Freq: Every day | ORAL | Status: DC
  Administered 2018-01-08 – 2018-01-12 (×5): 40 mg via ORAL
  Filled 2018-01-07 (×6): qty 1

## 2018-01-07 MED ORDER — DOFETILIDE 250 MCG PO CAPS
250.0000 ug | ORAL_CAPSULE | Freq: Two times a day (BID) | ORAL | Status: DC
  Administered 2018-01-07 – 2018-01-12 (×10): 250 ug via ORAL
  Filled 2018-01-07 (×12): qty 1

## 2018-01-07 MED ORDER — GABAPENTIN 600 MG PO TABS
600.0000 mg | ORAL_TABLET | Freq: Four times a day (QID) | ORAL | Status: DC
  Administered 2018-01-07 – 2018-01-12 (×20): 600 mg via ORAL
  Filled 2018-01-07 (×20): qty 1

## 2018-01-07 MED ORDER — CARVEDILOL 6.25 MG PO TABS
6.2500 mg | ORAL_TABLET | Freq: Two times a day (BID) | ORAL | Status: DC
  Administered 2018-01-07 – 2018-01-12 (×10): 6.25 mg via ORAL
  Filled 2018-01-07 (×10): qty 1

## 2018-01-07 MED ORDER — MAGNESIUM OXIDE 400 MG PO CAPS: 400.0000 mg | ORAL_CAPSULE | Freq: Every day | ORAL | Status: DC

## 2018-01-07 MED ORDER — DIPHENHYDRAMINE HCL 50 MG/ML IJ SOLN
25.0000 mg | Freq: Once | INTRAMUSCULAR | Status: AC
  Administered 2018-01-07: 25 mg via INTRAVENOUS
  Filled 2018-01-07: qty 1

## 2018-01-07 MED ORDER — POLYSACCHARIDE IRON COMPLEX 150 MG PO CAPS
150.0000 mg | ORAL_CAPSULE | Freq: Every day | ORAL | Status: DC
  Administered 2018-01-07 – 2018-01-12 (×6): 150 mg via ORAL
  Filled 2018-01-07 (×6): qty 1

## 2018-01-07 MED ORDER — ACETAMINOPHEN 650 MG RE SUPP: 650.0000 mg | Freq: Four times a day (QID) | RECTAL | Status: DC | PRN

## 2018-01-07 NOTE — Progress Notes (Addendum)
Pharmacy Antibiotic and Anticoagulation Note  [name] is a 67 y.o. female admitted on 01/07/2018 with cellulitis.  Pharmacy has been consulted for vancomycin dosing. Presents to ED with worsening infection of right foot. Was on two antibiotics outpt from an urgent care (doxy and ceftin?) and is now being started on vanc/levaquin.  No new weight/renal function. Was admitted in January with a weight of 120.2kg and CrCl was in the 50's. Dosing on that admission and will adjust if needed. WBC 8.6, afebrile, blood cx's obtained.  Pharmacy was also consulted for Eliquis restart. Resumed home dosing of 5mg  BID for her Afib which is appropriate. Last dose this AM. Will sign off this consult and monitor peripherally.  Plan: Vancomycin 2000mg  x1 followed by 1500mg  Q24hrs Levaquin per MD Follow renal function, f/u weight, determine LOT, VT prn  Apixaban 5mg  BID Monitor CBC, platelets per protocol, s/sx of bleeding  Temp (24hrs), Avg:98.1 F (36.7 C), Min:98.1 F (36.7 C), Max:98.1 F (36.7 C)  Recent Labs  Lab 01/07/18 0932 01/07/18 1018  WBC 8.6  --   CREATININE 1.24*  --   LATICACIDVEN  --  1.80    CrCl cannot be calculated (Unknown ideal weight.).    Allergies  Allergen Reactions  . Zolpidem Tartrate Other (See Comments)    hallucinations  . Amoxicillin Rash and Other (See Comments)    Has patient had a PCN reaction causing immediate rash, facial/tongue/throat swelling, SOB or lightheadedness with hypotension: Yes Has patient had a PCN reaction causing severe rash involving mucus membranes or skin necrosis: No Has patient had a PCN reaction that required hospitalization: No Has patient had a PCN reaction occurring within the last 10 years: Yes If all of the above answers are "NO", then may proceed with Cephalosporin use.     Antimicrobials this admission: Vancomycin 4/14>> LVQ 4/14>>  Microbiology results: Blood 4/14>> sent  Thank you for allowing pharmacy to be a  part of this patient's care.   Nolen MuAustin J Gessica Jawad PharmD PGY1 Pharmacy Practice Resident 01/07/2018 4:20 PM

## 2018-01-07 NOTE — H&P (Addendum)
TRH H&P   Patient Demographics:    [name], is a 67 y.o. female  MRN: [id number]   DOB - 07/19/1951  Admit Date - 01/07/2018  Outpatient Primary MD for the patient is [name], MD  Referring MD/NP/PA:   Irena Cords  Outpatient Specialists:    Patient coming from: home  Chief Complaint  Patient presents with  . Foot Pain    infection      HPI:    [name]  is a 67 y.o. female,   w hypertension, hyperlipidemia, OSA, RLS w recent  ? Osteomyelitis of the left foot presents with c/o bleeding of the right 2nd toe and swelling of the right foot.    In ED,  Xray right foot IMPRESSION: 1. Persistent prominent plantar soft tissue swelling deep to the first MTP joint with a few foci of subcutaneous emphysema, consistent with soft tissue infection. No definite radiographic evidence of osteomyelitis. 2. Unchanged cystic appearance at the medial aspect of the first MTP joint, favored to reflect sequelae of gout. 3. Unchanged midfoot neuropathic arthropathy.   Na 136, K 3.5 Bun 22, Creatinine 1.24 Ast 19, Alt 12  Wbc 8.6, Hgb 10.0 Plt 366  INR 1.39  Pt will be admitted for infection of the right foot, toe.       Review of systems:    In addition to the HPI above, No Fever-chills, No Headache, No changes with Vision or hearing, No problems swallowing food or Liquids, No Chest pain, Cough or Shortness of Breath, No Abdominal pain, No Nausea or Vommitting, Bowel movements are regular, No Blood in stool or Urine, No dysuria,  No new joints pains-aches,  No new weakness, tingling, numbness in any extremity, No recent weight gain or loss, No polyuria, polydypsia or polyphagia, No significant Mental Stressors.  A full 10 point Review of Systems was done, except as stated above, all other Review of Systems were negative.   With Past History of  the following :    Past Medical History:  Diagnosis Date  . Anxiety   . 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  PT ALSO HAS LOWER BACK PAIN AT TIMES  . Depression   . GERD (gastroesophageal reflux disease)    RARE- OTC ANTACID IF NEEDED--PAST HX OF ULCER  . Headache    wakes up with headache   . Hyperlipidemia   . Hypertension   . Kidney stones    PT PASSED STONES--NO KNOWN STONES AT PRESENT TIMES  . Lumbago 10/02/2013  . Obesity   . OSA (obstructive sleep apnea) 12/30/2013   Very mild OSA with REM accentuation,  tested 11-29-13 at [facility] sleep, Dr Rexene Alberts .   Marland Kitchen Periodic limb movement disorder (PLMD) 12/30/2013  . Restless leg syndrome   . Ulcer   . Urinary incontinence       Past Surgical  History:  Procedure Laterality Date  . ABDOMINAL AORTOGRAM W/LOWER EXTREMITY N/A 10/10/2017   Procedure: ABDOMINAL AORTOGRAM W/LOWER EXTREMITY;  Surgeon: Serafina Mitchell, MD;  Location: [facility];  Service: Cardiovascular;  Laterality: N/A;  . BILATERAL BUNIONECTOMY 1979    . BREAST SURGERY     biopsy   . COLON SURGERY    . PERIPHERAL VASCULAR INTERVENTION  10/10/2017   Procedure: PERIPHERAL VASCULAR INTERVENTION;  Surgeon: Serafina Mitchell, MD;  Location: [facility];  Service: Cardiovascular;;  lt. Iliac  . SMALL INTESTINE SURGERY    . SURGERY FOR BOWEL OBSTRUCTION AND REMOVAL OF PART OF STOMACH  IN 2000  2000  . TOTAL KNEE ARTHROPLASTY  02/16/2012   Procedure: TOTAL KNEE ARTHROPLASTY;  Surgeon: Johnn Hai, MD;  Location: [facility];  Service: Orthopedics;  Laterality: Left;  . TOTAL KNEE ARTHROPLASTY Right 11/20/2014   Procedure: RIGHT TOTAL KNEE ARTHROPLASTY;  Surgeon: Johnn Hai, MD;  Location: [facility];  Service: Orthopedics;  Laterality: Right;      Social History:     Social History   Tobacco Use  . Smoking status: Former Smoker    Packs/day: 2.00    Years: 20.00    Pack years: 40.00    Types: Cigarettes    Last attempt to quit: 10/17/1988     Years since quitting: 29.2  . Smokeless tobacco: Never Used  Substance Use Topics  . Alcohol use: No     Lives - at home  Mobility -  Walks by self   Family History :     Family History  Problem Relation Age of Onset  . Sleep apnea Father   . Hypertension Father   . Hypertension Mother   . Multiple sclerosis Sister   . Multiple sclerosis Sister       Home Medications:   Prior to Admission medications   Medication Sig Start Date End Date Taking? Authorizing Provider  acetaminophen (TYLENOL) 325 MG tablet Take 325-650 mg by mouth every 6 (six) hours as needed for mild pain or headache.    Yes [provider]  amLODipine (NORVASC) 5 MG tablet Take 1 tablet (5 mg total) by mouth daily. 12/19/17 03/19/18 Yes Burtis Junes, NP  buPROPion (WELLBUTRIN XL) 150 MG 24 hr tablet Take 150 mg by mouth daily.    Yes [provider]  carvedilol (COREG) 12.5 MG tablet Take 5.25 mg by mouth 2 (two) times daily with a meal.   Yes [provider]  cefUROXime (CEFTIN) 500 MG tablet Take 1 tablet (500 mg total) by mouth 2 (two) times daily with a meal. 12/22/17  Yes Golden Circle, FNP  cyanocobalamin (,VITAMIN B-12,) 1000 MCG/ML injection Inject 1,000 mcg into the muscle every 30 (thirty) days.   Yes [provider]  dofetilide (TIKOSYN) 250 MCG capsule TAKE 1 CAPSULE(250 MCG) BY MOUTH TWICE DAILY 10/20/17  Yes Burtis Junes, NP  doxycycline (VIBRAMYCIN) 100 MG capsule Take 1 capsule (100 mg total) by mouth 2 (two) times daily. 01/03/18  Yes Golden Circle, FNP  ELIQUIS 5 MG TABS tablet Take 5 mg by mouth 2 (two) times daily. 10/20/16  Yes [provider]  fluticasone (FLONASE) 50 MCG/ACT nasal spray Place 1 spray into both nostrils daily as needed for allergies.    Yes [provider]  gabapentin (NEURONTIN) 600 MG tablet Take 600 mg by mouth 4 (four) times daily.   Yes [provider]  hydrALAZINE (APRESOLINE) 50 MG  tablet Take  1.5 tablets (75 mg total) by mouth 3 (three) times daily. 08/21/17  Yes Jerline Pain, MD  iron polysaccharides (NIFEREX) 150 MG capsule Take 150 mg by mouth daily.   Yes [provider]  lisinopril (PRINIVIL,ZESTRIL) 40 MG tablet Take 40 mg by mouth daily.   Yes [provider]  Magnesium Oxide 400 MG CAPS Take 1 capsule (400 mg total) by mouth daily. 12/20/17  Yes Burtis Junes, NP  mirtazapine (REMERON) 15 MG tablet Take 15 mg by mouth daily as needed (sleep).    Yes [provider]  Multiple Vitamins-Minerals (MULTIVITAMIN & MINERAL PO) Take 1 tablet by mouth daily.   Yes [provider]  pravastatin (PRAVACHOL) 40 MG tablet Take 40 mg by mouth daily.  11/08/17  Yes [provider]  vortioxetine HBr (TRINTELLIX) 5 MG TABS tablet Take 5 mg by mouth daily.   Yes [provider]  carvedilol (COREG) 6.25 MG tablet Take 1 tablet (6.25 mg total) by mouth 2 (two) times daily. Patient not taking: Reported on 01/07/2018 07/12/17   Jettie Booze, MD     Allergies:     Allergies  Allergen Reactions  . Zolpidem Tartrate Other (See Comments)    hallucinations  . Amoxicillin Rash and Other (See Comments)    Has patient had a PCN reaction causing immediate rash, facial/tongue/throat swelling, SOB or lightheadedness with hypotension: Yes Has patient had a PCN reaction causing severe rash involving mucus membranes or skin necrosis: No Has patient had a PCN reaction that required hospitalization: No Has patient had a PCN reaction occurring within the last 10 years: Yes If all of the above answers are "NO", then may proceed with Cephalosporin use.      Physical Exam:   Vitals  Blood pressure 121/69, pulse (!) 56, temperature 98.1 F (36.7 C), temperature source Oral, resp. rate 18, SpO2 100 %.   1. General  lying in bed in NAD,    2. Normal affect and insight, Not Suicidal or Homicidal, Awake Alert, Oriented X 3.  3. No F.N  deficits, ALL C.Nerves Intact, Strength 5/5 all 4 extremities, Sensation intact all 4 extremities, Plantars down going.  4. Ears and Eyes appear Normal, Conjunctivae clear, PERRLA. Moist Oral Mucosa.  5. Supple Neck, No JVD, No cervical lymphadenopathy appriciated, No Carotid Bruits.  6. Symmetrical Chest wall movement, Good air movement bilaterally, CTAB.  7. RRR, No Gallops, Rubs or Murmurs, No Parasternal Heave.  8. Positive Bowel Sounds, Abdomen Soft, No tenderness, No organomegaly appriciated,No rebound -guarding or rigidity.  9.  No Cyanosis,slight swelling of the right foot Slight erythema of the right foot all toes and about 1/4 of the dorsum of the foot.  Slight bleeding from 2nd toe right foot   10. Good muscle tone,  joints appear normal , no effusions, Normal ROM.  11. No Palpable Lymph Nodes in Neck or Axillae     Data Review:    CBC Recent Labs  Lab 01/07/18 0932  WBC 8.6  HGB 10.0*  HCT 33.2*  PLT 366  MCV 83.4  MCH 25.1*  MCHC 30.1  RDW 17.7*  LYMPHSABS 1.6  MONOABS 0.6  EOSABS 0.0  BASOSABS 0.0   ------------------------------------------------------------------------------------------------------------------  Chemistries  Recent Labs  Lab 01/07/18 0932  NA 136  K 3.5  CL 107  CO2 17*  GLUCOSE 106*  BUN 22*  CREATININE 1.24*  CALCIUM 8.9  AST 19  ALT 12*  ALKPHOS 117  BILITOT 0.5   ------------------------------------------------------------------------------------------------------------------  CrCl cannot be calculated (Unknown ideal weight.). ------------------------------------------------------------------------------------------------------------------ No results for input(s): TSH, T4TOTAL, T3FREE, THYROIDAB in the last 72 hours.  Invalid input(s): FREET3  Coagulation profile Recent Labs  Lab 01/07/18 0932  INR 1.39    ------------------------------------------------------------------------------------------------------------------- No results for input(s): DDIMER in the last 72 hours. -------------------------------------------------------------------------------------------------------------------  Cardiac Enzymes No results for input(s): CKMB, TROPONINI, MYOGLOBIN in the last 168 hours.  Invalid input(s): CK ------------------------------------------------------------------------------------------------------------------ No results found for: BNP   ---------------------------------------------------------------------------------------------------------------  Urinalysis    Component Value Date/Time   COLORURINE YELLOW 06/06/2017 0758   APPEARANCEUR CLEAR 06/06/2017 0758   LABSPEC 1.013 06/06/2017 0758   PHURINE 5.0 06/06/2017 0758   GLUCOSEU NEGATIVE 06/06/2017 0758   HGBUR NEGATIVE 06/06/2017 0758   BILIRUBINUR NEGATIVE 06/06/2017 0758   BILIRUBINUR neg 04/21/2015 0956   KETONESUR NEGATIVE 06/06/2017 0758   PROTEINUR NEGATIVE 06/06/2017 0758   UROBILINOGEN 1.0 06/12/2015 1732   NITRITE NEGATIVE 06/06/2017 0758   LEUKOCYTESUR SMALL (A) 06/06/2017 0758    ----------------------------------------------------------------------------------------------------------------   Imaging Results:    Dg Foot Complete Right  Result Date: 01/07/2018 CLINICAL DATA:  Right foot infection for the past month. Evaluate for osteomyelitis. EXAM: RIGHT FOOT COMPLETE - 3+ VIEW COMPARISON:  Right foot x-rays dated December 19, 2017. FINDINGS: Persistent prominent plantar soft tissue swelling deep to the first MTP joint with a few foci of subcutaneous emphysema. No new cortical destruction or osteolysis. Cystic change along the medial aspect of the first metatarsal head and first proximal phalanx base is unchanged and favored to reflect sequelae of gout. Stable erosive changes about the fifth MTP joint. Unchanged  midfoot neuropathic arthropathy. No acute fracture or dislocation. Diffuse osteopenia. IMPRESSION: 1. Persistent prominent plantar soft tissue swelling deep to the first MTP joint with a few foci of subcutaneous emphysema, consistent with soft tissue infection. No definite radiographic evidence of osteomyelitis. 2. Unchanged cystic appearance at the medial aspect of the first MTP joint, favored to reflect sequelae of gout. 3. Unchanged midfoot neuropathic arthropathy. Electronically Signed   By: Titus Dubin M.D.   On: 01/07/2018 13:23       Assessment & Plan:    Active Problems:   Toe infection    Toe infection/ Celullitis r/o osteomyelitis Check ESR, Crp Blood culture x2 Check MRI right foot vanco iv, levaquin iv pharmacy to dose  Hypertension Cont lisinopril Cont carvedilol Cont Norvasc  Hyperlipidemia Cont pravastatin  Anxiety/ depression Cont Trintellix  Pafib  Cont Tikosyn Cont Eliquis  DVT Prophylaxis  Eliquis SCDs  AM Labs Ordered, also please review Full Orders  Family Communication: Admission, patients condition and plan of care including tests being ordered have been discussed with the patient who indicate understanding and agree with the plan and Code Status.  Code Status FULL CODE  Likely DC to  home  Condition GUARDED    Consults called: none  Admission status: inpatient   Time spent in minutes : 45   Jani Gravel M.D on 01/07/2018 at 2:25 PM  Between 7am to 7pm - Pager - [phone]  . After 7pm go to [email or web address] - password [email or web address]  [facility] - Office  [phone]

## 2018-01-07 NOTE — ED Triage Notes (Signed)
Pt. Stated, I ve had foot infection cellulitis 3 weeks ago, last night I had a bump and this morning I had bleed everywhere. Yvette Atlasve been going to wound center for 3 months. The infection was noticed 3 weeks ago. Still taking  Doxycycline and another antibiotics.

## 2018-01-07 NOTE — Significant Event (Signed)
Rapid Response Event Note  Overview: Allergic Reaction  Initial Focused Assessment: While I unit, RN informed me that patient was complaining of itching in the back of her head, neck, upper back, and chest.  RN informed that patient was receiving Vancomycin IV.  RN stopped the infusion.  Upon assessment, patient was not in distress, neuro intact, stated this happened that last time when was admitted (Amoxil ?) and was treated with benadryl.  Redness present on the back of her head, neck, upper back, and chest, her extremites and the rest of her trunk were of normal complexion  Interventions: -- [facility] NP updated, Vanco stopped, line flushed -- Benadryl 25mg  IV x 1   Plan of Care: -- Will follow as needed.  Event Summary:   [facility]    [facility]128 End Time 2138  Lucio Litsey R

## 2018-01-07 NOTE — ED Provider Notes (Signed)
[facility] EMERGENCY DEPARTMENT Provider Note   CSN: [id number] Arrival date & time: 01/07/18  4098     History   Chief Complaint Chief Complaint  Patient presents with  . Foot Pain    infection    HPI [name] is a 67 y.o. female.  HPI Patient presents to the emergency department with worsening infection in the right foot.  The patient states she is been dealing with this over the last week.  She states she was seen in urgent care and placed on doxycycline.  She states that she was seen by her wound doctor and infectious disease and these place her on another antibiotic.  Patient states that she was told to come here for any worsening in her condition she states last night that the second toe on the right foot became more red and swollen and then this morning noticed an open area that was draining bloody material.  The patient denies chest pain, shortness of breath, headache,blurred vision, neck pain, fever, cough, weakness, numbness, dizziness, anorexia, edema, abdominal pain, nausea, vomiting, diarrhea, rash, back pain, dysuria, hematemesis, bloody stool, near syncope, or syncope. Past Medical History:  Diagnosis Date  . Anxiety   . 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  PT ALSO HAS LOWER BACK PAIN AT TIMES  . Depression   . GERD (gastroesophageal reflux disease)    RARE- OTC ANTACID IF NEEDED--PAST HX OF ULCER  . Headache    wakes up with headache   . Hyperlipidemia   . Hypertension   . Kidney stones    PT PASSED STONES--NO KNOWN STONES AT PRESENT TIMES  . Lumbago 10/02/2013  . Obesity   . OSA (obstructive sleep apnea) 12/30/2013   Very mild OSA with REM accentuation,  tested 11-29-13 at [facility] sleep, Dr Frances Furbish .   Marland Kitchen Periodic limb movement disorder (PLMD) 12/30/2013  . Restless leg syndrome   . Ulcer   . Urinary incontinence     Patient Active Problem List   Diagnosis Date Noted  . Osteomyelitis (HCC) 12/22/2017  . PAD (peripheral  artery disease) (HCC) 10/10/2017  . Open wound of left foot 06/06/2017  . Cellulitis of foot 06/05/2017  . Paroxysmal atrial fibrillation (HCC) 03/14/2017  . Dyspnea 06/12/2015  . Morbid obesity (HCC) 06/12/2015  . 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 Periodic limb movement disorder (PLMD) 12/30/2013  . OSA (obstructive sleep apnea) 12/30/2013  . Dizziness and giddiness 10/02/2013  . Abnormality of gait 10/02/2013  . Lumbago 10/02/2013  . Essential hypertension 02/17/2012  . 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 Palpitations 12/24/2009  . DYSPNEA ON EXERTION 12/24/2009  . CHEST PAIN 12/24/2009    Past Surgical History:  Procedure Laterality Date  . ABDOMINAL AORTOGRAM W/LOWER EXTREMITY N/A 10/10/2017   Procedure: ABDOMINAL AORTOGRAM W/LOWER EXTREMITY;  Surgeon: Nada Libman, MD;  Location: [facility] INVASIVE CV LAB;  Service: Cardiovascular;  Laterality: N/A;  . BILATERAL BUNIONECTOMY 1979    . BREAST SURGERY     biopsy   . COLON SURGERY    . PERIPHERAL VASCULAR INTERVENTION  10/10/2017   Procedure: PERIPHERAL VASCULAR INTERVENTION;  Surgeon: Nada Libman, MD;  Location: [facility] INVASIVE CV LAB;  Service: Cardiovascular;;  lt. Iliac  . SMALL INTESTINE SURGERY    . SURGERY FOR BOWEL OBSTRUCTION AND REMOVAL OF PART OF STOMACH  IN 2000  2000  . TOTAL KNEE ARTHROPLASTY  02/16/2012   Procedure: TOTAL KNEE ARTHROPLASTY;  Surgeon:  Javier Docker, MD;  Location: [facility] ORS;  Service: Orthopedics;  Laterality: Left;  . TOTAL KNEE ARTHROPLASTY Right 11/20/2014   Procedure: RIGHT TOTAL KNEE ARTHROPLASTY;  Surgeon: Javier Docker, MD;  Location: [facility] ORS;  Service: Orthopedics;  Laterality: Right;     OB History   None      Home Medications    Prior to Admission medications   Medication Sig Start Date End Date Taking? Authorizing Provider  acetaminophen (TYLENOL) 325 MG tablet Take 325-650 mg by mouth every 6 (six) hours as needed for mild pain or headache.    Yes [provider]    amLODipine (NORVASC) 5 MG tablet Take 1 tablet (5 mg total) by mouth daily. 12/19/17 03/19/18 Yes Rosalio Macadamia, NP  buPROPion (WELLBUTRIN XL) 150 MG 24 hr tablet Take 150 mg by mouth daily.    Yes [provider]  carvedilol (COREG) 12.5 MG tablet Take 5.25 mg by mouth 2 (two) times daily with a meal.   Yes [provider]  cefUROXime (CEFTIN) 500 MG tablet Take 1 tablet (500 mg total) by mouth 2 (two) times daily with a meal. 12/22/17  Yes Veryl Speak, FNP  cyanocobalamin (,VITAMIN B-12,) 1000 MCG/ML injection Inject 1,000 mcg into the muscle every 30 (thirty) days.   Yes [provider]  dofetilide (TIKOSYN) 250 MCG capsule TAKE 1 CAPSULE(250 MCG) BY MOUTH TWICE DAILY 10/20/17  Yes Rosalio Macadamia, NP  doxycycline (VIBRAMYCIN) 100 MG capsule Take 1 capsule (100 mg total) by mouth 2 (two) times daily. 01/03/18  Yes Veryl Speak, FNP  ELIQUIS 5 MG TABS tablet Take 5 mg by mouth 2 (two) times daily. 10/20/16  Yes [provider]  fluticasone (FLONASE) 50 MCG/ACT nasal spray Place 1 spray into both nostrils daily as needed for allergies.    Yes [provider]  gabapentin (NEURONTIN) 600 MG tablet Take 600 mg by mouth 4 (four) times daily.   Yes [provider]  hydrALAZINE (APRESOLINE) 50 MG tablet Take 1.5 tablets (75 mg total) by mouth 3 (three) times daily. 08/21/17  Yes Jake Bathe, MD  iron polysaccharides (NIFEREX) 150 MG capsule Take 150 mg by mouth daily.   Yes [provider]  lisinopril (PRINIVIL,ZESTRIL) 40 MG tablet Take 40 mg by mouth daily.   Yes [provider]  Magnesium Oxide 400 MG CAPS Take 1 capsule (400 mg total) by mouth daily. 12/20/17  Yes Rosalio Macadamia, NP  mirtazapine (REMERON) 15 MG tablet Take 15 mg by mouth daily as needed (sleep).    Yes [provider]  Multiple Vitamins-Minerals (MULTIVITAMIN & MINERAL PO) Take 1 tablet by mouth daily.   Yes [provider]   pravastatin (PRAVACHOL) 40 MG tablet Take 40 mg by mouth daily.  11/08/17  Yes [provider]  vortioxetine HBr (TRINTELLIX) 5 MG TABS tablet Take 5 mg by mouth daily.   Yes [provider]  carvedilol (COREG) 6.25 MG tablet Take 1 tablet (6.25 mg total) by mouth 2 (two) times daily. Patient not taking: Reported on 01/07/2018 07/12/17   Corky Crafts, MD    Family History Family History  Problem Relation Age of Onset  . Sleep apnea Father   . Hypertension Father   . Hypertension Mother   . Multiple sclerosis Sister   . Multiple sclerosis Sister     Social History Social History   Tobacco Use  . Smoking status: Former Smoker    Packs/day: 2.00  Years: 20.00    Pack years: 40.00    Types: Cigarettes    Last attempt to quit: 10/17/1988    Years since quitting: 29.2  . Smokeless tobacco: Never Used  Substance Use Topics  . Alcohol use: No  . Drug use: No    Comment: Marijuana - 25 years ago     Allergies   Zolpidem tartrate and Amoxicillin   Review of Systems Review of Systems All other systems negative except as documented in the HPI. All pertinent positives and negatives as reviewed in the HPI.  Physical Exam Updated Vital Signs BP 121/69 (BP Location: Left Arm)   Pulse (!) 56   Temp 98.1 F (36.7 C) (Oral)   Resp 18   LMP  (LMP Unknown)   SpO2 100%   Physical Exam  Constitutional: She is oriented to person, place, and time. She appears well-developed and well-nourished. No distress.  HENT:  Head: Normocephalic and atraumatic.  Mouth/Throat: Oropharynx is clear and moist.  Eyes: Pupils are equal, round, and reactive to light.  Neck: Normal range of motion. Neck supple.  Cardiovascular: Normal rate, regular rhythm and normal heart sounds. Exam reveals no gallop and no friction rub.  No murmur heard. Pulmonary/Chest: Effort normal and breath sounds normal. No respiratory distress. She has no wheezes.  Musculoskeletal:        Feet:  Neurological: She is alert and oriented to person, place, and time. She exhibits normal muscle tone. Coordination normal.  Skin: Skin is warm and dry. Capillary refill takes less than 2 seconds. No rash noted. No erythema.  Psychiatric: She has a normal mood and affect. Her behavior is normal.  Nursing note and vitals reviewed.    ED Treatments / Results  Labs (all labs ordered are listed, but only abnormal results are displayed) Labs Reviewed  COMPREHENSIVE METABOLIC PANEL - Abnormal; Notable for the following components:      Result Value   CO2 17 (*)    Glucose, Bld 106 (*)    BUN 22 (*)    Creatinine, Ser 1.24 (*)    Albumin 2.9 (*)    ALT 12 (*)    GFR calc non Af Amer 44 (*)    GFR calc Af Amer 51 (*)    All other components within normal limits  CBC WITH DIFFERENTIAL/PLATELET - Abnormal; Notable for the following components:   Hemoglobin 10.0 (*)    HCT 33.2 (*)    MCH 25.1 (*)    RDW 17.7 (*)    All other components within normal limits  PROTIME-INR - Abnormal; Notable for the following components:   Prothrombin Time 16.9 (*)    All other components within normal limits  CULTURE, BLOOD (ROUTINE X 2)  CULTURE, BLOOD (ROUTINE X 2)  URINALYSIS, ROUTINE W REFLEX MICROSCOPIC  I-STAT CG4 LACTIC ACID, ED  I-STAT CG4 LACTIC ACID, ED    EKG None  Radiology Dg Foot Complete Right  Result Date: 01/07/2018 CLINICAL DATA:  Right foot infection for the past month. Evaluate for osteomyelitis. EXAM: RIGHT FOOT COMPLETE - 3+ VIEW COMPARISON:  Right foot x-rays dated December 19, 2017. FINDINGS: Persistent prominent plantar soft tissue swelling deep to the first MTP joint with a few foci of subcutaneous emphysema. No new cortical destruction or osteolysis. Cystic change along the medial aspect of the first metatarsal head and first proximal phalanx base is unchanged and favored to reflect sequelae of gout. Stable erosive changes about the fifth MTP joint. Unchanged midfoot  neuropathic  arthropathy. No acute fracture or dislocation. Diffuse osteopenia. IMPRESSION: 1. Persistent prominent plantar soft tissue swelling deep to the first MTP joint with a few foci of subcutaneous emphysema, consistent with soft tissue infection. No definite radiographic evidence of osteomyelitis. 2. Unchanged cystic appearance at the medial aspect of the first MTP joint, favored to reflect sequelae of gout. 3. Unchanged midfoot neuropathic arthropathy. Electronically Signed   By: Obie DredgeWilliam T Derry M.D.   On: 01/07/2018 13:23    Procedures Procedures (including critical care time)  Medications Ordered in ED Medications - No data to display   Initial Impression / Assessment and Plan / ED Course  I have reviewed the triage vital signs and the nursing notes.  Pertinent labs & imaging results that were available during my care of the patient were reviewed by me and considered in my medical decision making (see chart for details).  Clinical Course as of Jan 08 1352  Sun Jan 07, 2018  1130 Pulse Rate(!): 56 [LB]    Clinical Course User Index [LB] Luana ShuBritt, Lauren L, Student-PA   I spoke with the admitting doctors for admission because the patient is failed outpatient therapy for this cellulitis and foot infection.  Patient will be admitted and I advised the admitting doctors that she has seen ID for this.  Final Clinical Impressions(s) / ED Diagnoses   Final diagnoses:  None    ED Discharge Orders    None       Charlestine NightLawyer, Akita Maxim, PA-C 01/07/18 1533    Long, Arlyss RepressJoshua G, MD 01/07/18 [id number]

## 2018-01-08 ENCOUNTER — Inpatient Hospital Stay (HOSPITAL_COMMUNITY): Payer: PPO

## 2018-01-08 ENCOUNTER — Telehealth: Payer: Self-pay

## 2018-01-08 ENCOUNTER — Ambulatory Visit: Payer: PPO | Admitting: Family

## 2018-01-08 LAB — COMPREHENSIVE METABOLIC PANEL
ALBUMIN: 2.5 g/dL — AB (ref 3.5–5.0)
ALK PHOS: 105 U/L (ref 38–126)
ALT: 10 U/L — AB (ref 14–54)
AST: 15 U/L (ref 15–41)
Anion gap: 9 (ref 5–15)
BILIRUBIN TOTAL: 0.5 mg/dL (ref 0.3–1.2)
BUN: 18 mg/dL (ref 6–20)
CO2: 21 mmol/L — ABNORMAL LOW (ref 22–32)
CREATININE: 1.16 mg/dL — AB (ref 0.44–1.00)
Calcium: 8.5 mg/dL — ABNORMAL LOW (ref 8.9–10.3)
Chloride: 110 mmol/L (ref 101–111)
GFR calc Af Amer: 56 mL/min — ABNORMAL LOW (ref >60)
GFR, EST NON AFRICAN AMERICAN: 48 mL/min — AB (ref >60)
GLUCOSE: 100 mg/dL — AB (ref 65–99)
Potassium: 3.4 mmol/L — ABNORMAL LOW (ref 3.5–5.1)
Sodium: 140 mmol/L (ref 135–145)
TOTAL PROTEIN: 6.2 g/dL — AB (ref 6.5–8.1)

## 2018-01-08 LAB — CBC
HCT: 30.9 % — ABNORMAL LOW (ref 36.0–46.0)
Hemoglobin: 9.2 g/dL — ABNORMAL LOW (ref 12.0–15.0)
MCH: 25.3 pg — ABNORMAL LOW (ref 26.0–34.0)
MCHC: 29.8 g/dL — AB (ref 30.0–36.0)
MCV: 84.9 fL (ref 78.0–100.0)
PLATELETS: 347 10*3/uL (ref 150–400)
RBC: 3.64 MIL/uL — ABNORMAL LOW (ref 3.87–5.11)
RDW: 18.4 % — AB (ref 11.5–15.5)
WBC: 4.7 10*3/uL (ref 4.0–10.5)

## 2018-01-08 MED ORDER — SODIUM CHLORIDE 0.9 % IV SOLN
1.0000 g | Freq: Three times a day (TID) | INTRAVENOUS | Status: DC
  Administered 2018-01-08 – 2018-01-12 (×13): 1 g via INTRAVENOUS
  Filled 2018-01-08 (×15): qty 1

## 2018-01-08 MED ORDER — POTASSIUM CHLORIDE CRYS ER 20 MEQ PO TBCR
40.0000 meq | EXTENDED_RELEASE_TABLET | Freq: Once | ORAL | Status: AC
  Administered 2018-01-08: 40 meq via ORAL
  Filled 2018-01-08: qty 2

## 2018-01-08 MED ORDER — GADOBENATE DIMEGLUMINE 529 MG/ML IV SOLN
20.0000 mL | Freq: Once | INTRAVENOUS | Status: AC | PRN
  Administered 2018-01-08: 20 mL via INTRAVENOUS

## 2018-01-08 MED ORDER — VANCOMYCIN HCL 10 G IV SOLR
1500.0000 mg | INTRAVENOUS | Status: DC
  Administered 2018-01-08 – 2018-01-10 (×3): 1500 mg via INTRAVENOUS
  Filled 2018-01-08 (×4): qty 1500

## 2018-01-08 NOTE — Care Management Note (Addendum)
Case Management Note  Patient Details  Name: [name] MRN: [id number] Date of Birth: [birth year]  Subjective/Objective:    History of HTN, hyperlipidemia, OSA, RLS w recent? Osteomyelitis of the left foot; Admitted on 01/07/2018 with cellulitis.   Action/Plan: Prior to admission patient active with [facility] for RN/PT.  Will need new Home Health orders at discharge. In to speak with patient.  Patient states she would like to remain with [facility] for services.  Home DME: walker, cane, rollator.  Prior to admission patient lived at home alone. Will be returning to the same living situation after discharge.  At discharge, patient states she will try to arrange for her friend to pick her up.  NCM advised we could offer a bus pass, then states she would go ahead and plan on a taxi.  NCM asked if she had money to pay for taxi, she said "would need to go by ATM".  Patient has the ability to pay for medications and food.  Uses [employer] on [employer].  Patient states she is able to obtain transportation for medical appointments.   NCM will continue to monitor for discharge transition needs.  Expected Discharge Date:    To Be Determined             Expected Discharge Plan:  [place]  In-House Referral:   N/A  Discharge planning Services  CM Consult  Post Acute Care Choice:   Home health Choice offered to:   Patient  DME Arranged:   n/a DME Agency:   n/a  HH Arranged:   PT/RN HH Agency:   [facility]  Status of Service:  In process, will continue to follow  If discussed at Long Length of Stay Meetings, dates discussed:    Additional Comments:  Kristen Cardinal, RN  Nurse case [place] 01/08/2018, 10:57 AM

## 2018-01-08 NOTE — Progress Notes (Signed)
[facility]  Patient Status: Active (receiving services up to time of hospitalization)  [facility] is providing the following services: RN and PT  If patient discharges after hours, please call [phone].   Kizzie FurnishDonna Fellmy 01/08/2018, 10:20 AM

## 2018-01-08 NOTE — Telephone Encounter (Signed)
**Note De-Identified  Obfuscation** The pt returned her completed Eliquis (BMS) and Dofetilide Proofreader([employer]) pt assistance applications.  I have placed both in Dr Bruna Potteraylors mail bin awaiting his signature.

## 2018-01-08 NOTE — Progress Notes (Signed)
PROGRESS NOTE    [name]  ZOX:[id number] DOB: 07/20/1951 DOA: 01/07/2018 PCP: Jarome MatinPaterson, Daniel, MD    Brief Narrative:  67 year old female who presented with right foot swelling and pain.  She does have a significant past medical history for hypertension, dyslipidemia and obstructive sleep apnea.  Patient developed right foot second toe edema, erythema and tenderness.  On her initial physical examination blood pressure 121/69, heart rate 59, temperature 98.1, respiratory rate 18, oxygen saturation 100%.  Moist mucous membranes, lungs clear to auscultation, heart S1-S2 present rhythmic, the abdomen was soft nontender, lower extremities no edema.  Right foot second toe with erythema and edema, active bleeding at the site of the rash.  Sodium 136, potassium 3.5, chloride 107, bicarb 17, glucose 106, BUN 22, creatinine 1.24, white count 8.6, hemoglobin 10.0, hematocrit 33.2, platelets 366.  X-ray with persistent prominent plantar soft tissue swelling deep to the first MTP joint with a few foci of subcutaneous emphysema, consistent with soft tissue infection.  No definite radiographic evidence of osteomyelitis.  Patient was admitted to the hospital with right foot cellulitis rule out osteomyelitis at the right foot.   Assessment & Plan:   Active Problems:   Toe infection   1.  Right foot cellulitis, rule out osteomyelitis. Will continue antibiotic therapy with vancomycin and cefepime (patient allergic to penicillin but tolerated well cephalosporins in the past). Had red man syndrome reaction to IV vancomycin, will slow the rate of infusion and continue monitoring. Follow on foot MRI, and wound care team. Follow on culture and temperature curve.   2.  Hypertension. Continue blood pressure monitoring, continue hydralazine, lisinopril, amlodipine, carvedilol for blood pressure control    3.  Dyslipidemia. Continue statin therapy, with pravastatin.   4.  Paroxysmal atrial fibrillation. Rate  controlled with carvedilol and dofetilide, anticoagulation apixaban. EKG personally reviewed noted sinus rhythm with normal intervals and normal axis.   5.  Anxiety/depression. No confusion or agitation, continue with mirtazapine and bupropion.   6. Hypokalemia. Replete K with po kcl, follow on renal panel in am, serum cr at 1,16    DVT prophylaxis:   Code Status:  full Family Communication:  No family at the bedside Disposition Plan:  Home when clinically improved   Consultants:     Procedures:     Antimicrobials:   Vancomycin   Cefepime    Subjective: Patient feeling better but no back to baseline, foot pan is controlled on the left, no nausea or vomiting, no chest pain or dyspnea.   Objective: Vitals:   01/07/18 1647 01/07/18 1657 01/07/18 2223 01/08/18 0519  BP: (!) 175/72 (!) 165/89 (!) 143/64 (!) 142/60  Pulse: 72 64 72 71  Resp: 16 18 16 16   Temp: 98.2 F (36.8 C) 98.1 F (36.7 C) 98.3 F (36.8 C) 98.2 F (36.8 C)  TempSrc: Oral Oral Oral Oral  SpO2: 99% 97% 97% 97%    Intake/Output Summary (Last 24 hours) at 01/08/2018 1127 Last data filed at 01/08/2018 0600 Gross per 24 hour  Intake 580 ml  Output 0 ml  Net 580 ml   There were no vitals filed for this visit.  Examination:   General: Not in pain or dyspnea, deconditioned Neurology: Awake and alert, non focal  E ENT: no pallor, no icterus, oral mucosa moist Cardiovascular: No JVD. S1-S2 present, rhythmic, no gallops, rubs, or murmurs. No lower extremity edema. Pulmonary: vesicular breath sounds bilaterally, adequate air movement, no wheezing, rhonchi or rales. Gastrointestinal. Abdomen with no organomegaly,  non tender, no rebound or guarding Skin. Bilateral plantar ulcers. On the right has small deep ulcer, not able to stage  Musculoskeletal: no joint deformities     Data Reviewed: I have personally reviewed following labs and imaging studies  CBC: Recent Labs  Lab 01/07/18 0932  01/08/18 0650  WBC 8.6 4.7  NEUTROABS 6.4  --   HGB 10.0* 9.2*  HCT 33.2* 30.9*  MCV 83.4 84.9  PLT 366 347   Basic Metabolic Panel: Recent Labs  Lab 01/07/18 0932 01/08/18 0650  NA 136 140  K 3.5 3.4*  CL 107 110  CO2 17* 21*  GLUCOSE 106* 100*  BUN 22* 18  CREATININE 1.24* 1.16*  CALCIUM 8.9 8.5*   GFR: CrCl cannot be calculated (Unknown ideal weight.). Liver Function Tests: Recent Labs  Lab 01/07/18 0932 01/08/18 0650  AST 19 15  ALT 12* 10*  ALKPHOS 117 105  BILITOT 0.5 0.5  PROT 6.9 6.2*  ALBUMIN 2.9* 2.5*   No results for input(s): LIPASE, AMYLASE in the last 168 hours. No results for input(s): AMMONIA in the last 168 hours. Coagulation Profile: Recent Labs  Lab 01/07/18 0932  INR 1.39   Cardiac Enzymes: No results for input(s): CKTOTAL, CKMB, CKMBINDEX, TROPONINI in the last 168 hours. BNP (last 3 results) No results for input(s): PROBNP in the last 8760 hours. HbA1C: No results for input(s): HGBA1C in the last 72 hours. CBG: No results for input(s): GLUCAP in the last 168 hours. Lipid Profile: No results for input(s): CHOL, HDL, LDLCALC, TRIG, CHOLHDL, LDLDIRECT in the last 72 hours. Thyroid Function Tests: No results for input(s): TSH, T4TOTAL, FREET4, T3FREE, THYROIDAB in the last 72 hours. Anemia Panel: No results for input(s): VITAMINB12, FOLATE, FERRITIN, TIBC, IRON, RETICCTPCT in the last 72 hours.    Radiology Studies: I have reviewed all of the imaging during this hospital visit personally     Scheduled Meds: . amLODipine  5 mg Oral Daily  . apixaban  5 mg Oral BID  . buPROPion  150 mg Oral Daily  . carvedilol  6.25 mg Oral BID  . dofetilide  250 mcg Oral BID  . gabapentin  600 mg Oral QID  . hydrALAZINE  75 mg Oral TID  . iron polysaccharides  150 mg Oral Daily  . lisinopril  40 mg Oral Daily  . magnesium oxide  400 mg Oral Daily  . pravastatin  40 mg Oral q1800  . vortioxetine HBr  5 mg Oral Daily   Continuous  Infusions: . ceFEPime (MAXIPIME) IV    . vancomycin       LOS: 1 day        Coralie Keens, MD [facility] Pager [phone]

## 2018-01-09 DIAGNOSIS — Z881 Allergy status to other antibiotic agents status: Secondary | ICD-10-CM

## 2018-01-09 DIAGNOSIS — G629 Polyneuropathy, unspecified: Secondary | ICD-10-CM

## 2018-01-09 DIAGNOSIS — L97529 Non-pressure chronic ulcer of other part of left foot with unspecified severity: Secondary | ICD-10-CM

## 2018-01-09 DIAGNOSIS — Z87891 Personal history of nicotine dependence: Secondary | ICD-10-CM

## 2018-01-09 DIAGNOSIS — L02611 Cutaneous abscess of right foot: Secondary | ICD-10-CM

## 2018-01-09 DIAGNOSIS — Z888 Allergy status to other drugs, medicaments and biological substances status: Secondary | ICD-10-CM

## 2018-01-09 DIAGNOSIS — I739 Peripheral vascular disease, unspecified: Secondary | ICD-10-CM

## 2018-01-09 DIAGNOSIS — L97519 Non-pressure chronic ulcer of other part of right foot with unspecified severity: Secondary | ICD-10-CM

## 2018-01-09 LAB — CBC WITH DIFFERENTIAL/PLATELET
Basophils Absolute: 0 10*3/uL (ref 0.0–0.1)
Basophils Relative: 0 %
Eosinophils Absolute: 0 10*3/uL (ref 0.0–0.7)
Eosinophils Relative: 0 %
HEMATOCRIT: 31.7 % — AB (ref 36.0–46.0)
HEMOGLOBIN: 9.4 g/dL — AB (ref 12.0–15.0)
Lymphocytes Relative: 20 %
Lymphs Abs: 1.3 10*3/uL (ref 0.7–4.0)
MCH: 25.2 pg — ABNORMAL LOW (ref 26.0–34.0)
MCHC: 29.7 g/dL — AB (ref 30.0–36.0)
MCV: 85 fL (ref 78.0–100.0)
MONO ABS: 0.7 10*3/uL (ref 0.1–1.0)
MONOS PCT: 11 %
NEUTROS ABS: 4.6 10*3/uL (ref 1.7–7.7)
NEUTROS PCT: 69 %
Platelets: 356 10*3/uL (ref 150–400)
RBC: 3.73 MIL/uL — ABNORMAL LOW (ref 3.87–5.11)
RDW: 18.3 % — AB (ref 11.5–15.5)
WBC: 6.7 10*3/uL (ref 4.0–10.5)

## 2018-01-09 LAB — BASIC METABOLIC PANEL
ANION GAP: 9 (ref 5–15)
BUN: 12 mg/dL (ref 6–20)
CHLORIDE: 109 mmol/L (ref 101–111)
CO2: 20 mmol/L — ABNORMAL LOW (ref 22–32)
CREATININE: 1.1 mg/dL — AB (ref 0.44–1.00)
Calcium: 8.5 mg/dL — ABNORMAL LOW (ref 8.9–10.3)
GFR, EST AFRICAN AMERICAN: 59 mL/min — AB (ref >60)
GFR, EST NON AFRICAN AMERICAN: 51 mL/min — AB (ref >60)
GLUCOSE: 98 mg/dL (ref 65–99)
Potassium: 4.2 mmol/L (ref 3.5–5.1)
Sodium: 138 mmol/L (ref 135–145)

## 2018-01-09 MED ORDER — LIDOCAINE-EPINEPHRINE 1 %-1:100000 IJ SOLN
10.0000 mL | Freq: Once | INTRAMUSCULAR | Status: DC
  Filled 2018-01-09: qty 10

## 2018-01-09 MED ORDER — VORTIOXETINE HBR 5 MG PO TABS
5.0000 mg | ORAL_TABLET | Freq: Every day | ORAL | Status: DC
  Administered 2018-01-10 – 2018-01-11 (×2): 5 mg via ORAL
  Filled 2018-01-09 (×3): qty 1

## 2018-01-09 NOTE — Procedures (Signed)
Procedure: Right foot I&D  Indication: Right foot abscess  Surgeon: Yvette IgoMichael Hanish Laraia, PA-C  Assist: None  Anesthesia: 1% lidocaine w/epi  EBL: None  Complications: None  Findings: After risks/benefits explained patient desires to undergo procedure. Consent obtained and time out performed. The right footwas sterilely prepped incision made proximal to 2nd toe. Small cavity entered but no obvious purulence or odor. Culture obtained. Packed with 1/4" iodoform gauze. Pt tolerated the procedure well.    Yvette CaldronMichael J. Darnita Woodrum, PA-C Orthopedic Surgery [phone]

## 2018-01-09 NOTE — Consult Note (Signed)
[facility]    Date of Admission:  01/07/2018   Total days of antibiotics 2        Day 2 Vancomycin         Day 1 Cefepime               Reason for Consult: foot ulcers    Referring Provider: Eloise Harman  Primary Care Provider: Jarome Matin, MD   Assessment: 67 y.o. female with chronic ulcerations of bilateral feet with recent worsening and new development of right #2 toe abscess formation. She is set to have I&D this afternoon. No changes on the right foot that are worrisome for osteomyelitis per MRI. Previously has had superficial swabs of the right foot with most recently "polymicrobial" but Group B Strep and Pasturella recovered in the past. 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   Will see what is recovered from I&D this afternoon of her soft tissue abscess. May need to consider PICC line placement considering the changes to her left foot worrisome for osteomyelitis. Has tolerated cephalosporins well; likely not a true amoxicillin allergy after discussing with her.   Plan: 1. Please send I&D sample for gram stain and culture.  2. Continue IV Vancomycin and Cefepime for now.   Active Problems:   Toe infection   . amLODipine  5 mg Oral Daily  . apixaban  5 mg Oral BID  . buPROPion  150 mg Oral Daily  . carvedilol  6.25 mg Oral BID  . dofetilide  250 mcg Oral BID  . gabapentin  600 mg Oral QID  . hydrALAZINE  75 mg Oral TID  . iron polysaccharides  150 mg Oral Daily  . lidocaine-EPINEPHrine  10 mL Intradermal Once  . lisinopril  40 mg Oral Daily  . magnesium oxide  400 mg Oral Daily  . pravastatin  40 mg Oral q1800  . vortioxetine HBr  5 mg Oral Daily    HPI: [name] is a 67 y.o. female with past medical history detailed below but significant for foot ulcerations. She was admitted  on 01/07/18 with foot pain and chronic ulcers and redness extending through mid foot on the right. She was normothermic, did not have leukocytosis and hemodynamically stable. Started empirically on vancomycin and levofloxacin IV. BCx drawn and negative. Levaquin was changed to IV cefepime as she tolerated cephalosporins in the past (amoxicillin allergy with listed rash).   MRI of her right foot without evidence of osteomyelitis and indicates only soft tissue involvement. 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 She was seen by [name] Sours in our clinic and started on oral therapy (Doxy + Ceftin) while she considered IV treatment for this. The ulceration on her left #5 foot has been present since September 2018 and started after result of a laceration. Progressed into a deep ulcer. She has a shallow ulcer on her left #1 MTP head on the plantar surface that is new. Right foot has bleeding area/abscess to #2 toe and ulceration to the plantar surface of her #1 MTP head here as well that appears deep. Reports it does not track to bone yet.   Presently with improvement  in her redness and swelling to the right foot. No worsening of other ulcerations. Minimal to no odor present. Denies any fevers before admission or since. Tolerating her current antibiotics well but did have Red man's syndrome a/w Vancomycin. Only reports a rash on amoxicillin 10 years prior but does also report to have previously tolerated this well.   Review of Systems: Review of Systems  All other systems reviewed and are negative.   Past Medical History:  Diagnosis Date  . Anxiety   . 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  PT ALSO HAS LOWER BACK PAIN AT TIMES  . Depression   . GERD (gastroesophageal reflux disease)    RARE- OTC ANTACID IF NEEDED--PAST HX  OF ULCER  . Headache    wakes up with headache   . Hyperlipidemia   . Hypertension   . Kidney stones    PT PASSED STONES--NO KNOWN STONES AT PRESENT TIMES  . Lumbago 10/02/2013  . Obesity   . OSA (obstructive sleep apnea) 12/30/2013   Very mild OSA with REM accentuation,  tested 11[facility]29-13 [facility], Dr Frances FurbishAthar .   Marland Kitchen. Periodic limb movement disorder (PLMD) 12/30/2013  . Restless leg syndrome   . Ulcer   . Urinary incontinence     Social History   Tobacco Use  . Smoking status: Former Smoker    Packs/day: 2.00    Years: 20.00    Pack years: 40.00    Types: Cigarettes    Last attempt to quit: 10/17/1988    Years since quitting: 29.2  . Smokeless tobacco: Never Used  Substance Use Topics  . Alcohol use: No  . Drug use: No    Comment: Marijuana - 25 years ago    Family History  Problem Relation Age of Onset  . Sleep apnea Father   . Hypertension Father   . Hypertension Mother   . Multiple sclerosis Sister   . Multiple sclerosis Sister    Allergies  Allergen Reactions  . Zolpidem Tartrate Other (See Comments)    hallucinations  . Amoxicillin Rash and Other (See Comments)    Has patient had a PCN reaction causing immediate rash, facial/tongue/throat swelling, SOB or lightheadedness with hypotension: Yes Has patient had a PCN reaction causing severe rash involving mucus membranes or skin necrosis: No Has patient had a PCN reaction that required hospitalization: No Has patient had a PCN reaction occurring within the last 10 years: Yes If all of the above answers are "NO", then may proceed with Cephalosporin use.     OBJECTIVE: Blood pressure (!) 152/70, pulse (!) 57, temperature 98.3 F (36.8 C), temperature source Oral, resp. rate 18, weight 251 lb 5.2 oz (114 kg), SpO2 99 %.  Physical Exam  Constitutional: She is oriented to person, place, and time. She appears well-developed and well-nourished.  Resting in bed comfortably.   HENT:  Mouth/Throat: Mucous membranes  are normal. No oral lesions. Normal dentition. No dental abscesses. No oropharyngeal exudate.  Cardiovascular: Normal rate, regular rhythm and normal heart sounds.  Pulmonary/Chest: Effort normal and breath sounds normal.  Abdominal: Soft. She exhibits no distension. There is no tenderness.  Lymphadenopathy:    She has no cervical adenopathy.  Neurological: She is alert and oriented to person, place, and time.  Skin: Skin is warm and dry. No rash noted.  Psychiatric: She has a normal mood and affect. Judgment normal.  In good spirits today and engaged in care discussion    Lab  Results Lab Results  Component Value Date   WBC 6.7 01/09/2018   HGB 9.4 (L) 01/09/2018   HCT 31.7 (L) 01/09/2018   MCV 85.0 01/09/2018   PLT 356 01/09/2018    Lab Results  Component Value Date   CREATININE 1.10 (H) 01/09/2018   BUN 12 01/09/2018   NA 138 01/09/2018   K 4.2 01/09/2018   CL 109 01/09/2018   CO2 20 (L) 01/09/2018    Lab Results  Component Value Date   ALT 10 (L) 01/08/2018   AST 15 01/08/2018   ALKPHOS 105 01/08/2018   BILITOT 0.5 01/08/2018     Microbiology: Recent Results (from the past 240 hour(s))  Aerobic Culture (superficial specimen)     Status: Abnormal   Collection Time: 01/03/18 10:20 AM  Result Value Ref Range Status   Specimen Description   Final    TOE RIGHT 1ST METATARSAL HEAD Performed at [facility], [address], [place], [place] [address]    Special Requests   Final    NONE Performed at [facility], [address], [place], [place] [address]    Gram Stain   Final    ABUNDANT WBC PRESENT,BOTH PMN AND MONONUCLEAR RARE GRAM POSITIVE COCCI RARE GRAM VARIABLE ROD Performed at [facility], [address] [place], [place] [address]    Culture MULTIPLE ORGANISMS PRESENT, NONE PREDOMINANT (A)  Final   Report Status 01/06/2018 FINAL  Final  Culture, blood (Routine x 2)     Status: None (Preliminary result)    Collection Time: 01/07/18 10:00 AM  Result Value Ref Range Status   Specimen Description BLOOD LEFT ARM  Final   Special Requests   Final    BOTTLES DRAWN AEROBIC AND ANAEROBIC Blood Culture results may not be optimal due to an inadequate volume of blood received in culture bottles   Culture   Final    NO GROWTH 1 DAY Performed at [facility], [address], [place], [place] [address]    Report Status PENDING  Incomplete  Culture, blood (Routine x 2)     Status: None (Preliminary result)   Collection Time: 01/07/18 10:04 AM  Result Value Ref Range Status   Specimen Description BLOOD RIGHT HAND  Final   Special Requests   Final    BOTTLES DRAWN AEROBIC AND ANAEROBIC Blood Culture results may not be optimal due to an inadequate volume of blood received in culture bottles   Culture   Final    NO GROWTH 1 DAY Performed at [facility], [address], [place], [place] [address]    Report Status PENDING  Incomplete    Rexene Alberts, MSN, NP-C [facility] Cell: [phone] Pager: [phone]  01/09/2018 11:46 AM

## 2018-01-09 NOTE — Consult Note (Signed)
WOC Nurse wound consult note Reason for Consult: Bilateral foot wounds Wound type: neuropathic POA: Yes Measurement: Plantar surface of right foot has one wound at the 1st metatarsal head.  The wound measures 0.8 cm x 0.5 cm x 1.3 cm and is surrounded by a heavy, dry callous ring.  There is no drainage, no odor from this wound.  What I can see of the wound bed is pink. The left plantar surface of the foot has a wound at the 1st metatarsal head that measures 0.3 cm x 0.3 cm x 0.2 cm and has a heavy dry callous to the site.  There is no drainage, no odor. I cannot visualize the wound bed due to the small size of the opening. The left foot also has a wound at the plantar 5th metatarsal head that measures 1.3 cm x 1.3 cm x 0.8 cm and has a macerated callous ring around it.  There is no odor or apparent drainage from this wound at the time of my assessment. The wound bed is pink. Plan of care for these sites are to cleanse the areas with Chlorprep one step, allow to air dry, place xeroform gauze over the wound beds and secure with kerlex.  Change daily.  I performed these wound dressings at the time of my assessment.  The next day they are due is 01/10/18. Thank you for the consult.  Discussed plan of care with the patient and bedside nurse.  WOC nurse will not follow at this time.  Please re-consult the WOC team if needed.  Helmut MusterSherry Sherrina Zaugg, RN, MSN, CWOCN, CNS-BC, pager [phone]

## 2018-01-09 NOTE — Progress Notes (Signed)
PROGRESS NOTE    Enriqueta ShutterCarolyn I Lough  WUJ:[id number] DOB: 1950-12-30 DOA: 01/07/2018 PCP: Jarome MatinPaterson, Daniel, MD    Brief Narrative:  67 year old female who presented with right foot swelling and pain.  She does have a significant past medical history for hypertension, dyslipidemia and obstructive sleep apnea.  Patient developed right foot second toe edema, erythema and tenderness.  On her initial physical examination blood pressure 121/69, heart rate 59, temperature 98.1, respiratory rate 18, oxygen saturation 100%.  Moist mucous membranes, lungs clear to auscultation, heart S1-S2 present rhythmic, the abdomen was soft nontender, lower extremities no edema.  Right foot second toe with erythema and edema, active bleeding at the site of the rash.  Sodium 136, potassium 3.5, chloride 107, bicarb 17, glucose 106, BUN 22, creatinine 1.24, white count 8.6, hemoglobin 10.0, hematocrit 33.2, platelets 366.  X-ray with persistent prominent plantar soft tissue swelling deep to the first MTP joint with a few foci of subcutaneous emphysema, consistent with soft tissue infection.  No definite radiographic evidence of osteomyelitis.  Patient was admitted to the hospital with right foot cellulitis rule out osteomyelitis at the right foot.   Assessment & Plan:   Active Problems:   Toe infection  1.  Right foot cellulitis, rule out osteomyelitis. On antibiotic therapy with IV vancomycin and cefepime. MRI with no osteomyelitis but small abscess at the 1st metatarsal region. Consulted orthopedics for opinion. Patient is followed at the ID clinic for left foot 5 metatarsal osteomyelitis, on oral doxycycline and Ceftin. ID consulted inpatient for further recommendations, will continue to follow on cell count and temperature curve.   2.  Hypertension. On hydralazine, lisinopril, amlodipine, carvedilol for blood pressure control, systolic blood pressure   3.  Dyslipidemia. On pravastatin.   4.  Paroxysmal atrial  fibrillation. Continue rate control with carvedilol and dofetilide, full anticoagulation with anticoagulation apixaban.   5.  Anxiety/depression. On mirtazapine and bupropion.   6. Hypokalemia. K has been corrected to 4,2, will continue close monitoring of renal function and electrolytes. Serum cr at 1,10. Patient tolerating po well.   DVT prophylaxis:   Code Status:  full Family Communication:  No family at the bedside Disposition Plan:  Home when clinically improved   Consultants:     Procedures:     Antimicrobials:   Vancomycin   Cefepime    Subjective: Patient feeling well but not back to her baseline, no nausea or vomiting, positive pain at the right foot. No chest pain, or dyspnea.   Objective: Vitals:   01/08/18 2045 01/09/18 0459 01/09/18 0500 01/09/18 1014  BP: (!) 166/73 139/70  (!) 152/70  Pulse: 68 63  (!) 57  Resp: 17 18  18   Temp: 98.4 F (36.9 C) 98 F (36.7 C)  98.3 F (36.8 C)  TempSrc:    Oral  SpO2: 97% 97%  99%  Weight: 114 kg (251 lb 5.2 oz)  114 kg (251 lb 5.2 oz)     Intake/Output Summary (Last 24 hours) at 01/09/2018 1148 Last data filed at 01/09/2018 0601 Gross per 24 hour  Intake 800 ml  Output 0 ml  Net 800 ml   Filed Weights   01/08/18 1422 01/08/18 2045 01/09/18 0500  Weight: 114 kg (251 lb 5.2 oz) 114 kg (251 lb 5.2 oz) 114 kg (251 lb 5.2 oz)    Examination:   General: Not in pain or dyspnea, deconditioned Neurology: Awake and alert, non focal  E ENT: no pallor, no icterus, oral mucosa moist Cardiovascular:  No JVD. S1-S2 present, rhythmic, no gallops, rubs, or murmurs. No lower extremity edema. Pulmonary: vesicular breath sounds bilaterally, adequate air movement, no wheezing, rhonchi or rales. Gastrointestinal. Abdomen flat, no organomegaly, non tender, no rebound or guarding Skin. Positive plantar ulcers, at the base of the 1st metatarsal on the right, mild drainage and erythema, discoloration of 2nd toe on the  right.  Musculoskeletal: no joint deformities     Data Reviewed: I have personally reviewed following labs and imaging studies  CBC: Recent Labs  Lab 01/07/18 0932 01/08/18 0650 01/09/18 0614  WBC 8.6 4.7 6.7  NEUTROABS 6.4  --  4.6  HGB 10.0* 9.2* 9.4*  HCT 33.2* 30.9* 31.7*  MCV 83.4 84.9 85.0  PLT 366 347 356   Basic Metabolic Panel: Recent Labs  Lab 01/07/18 0932 01/08/18 0650 01/09/18 0614  NA 136 140 138  K 3.5 3.4* 4.2  CL 107 110 109  CO2 17* 21* 20*  GLUCOSE 106* 100* 98  BUN 22* 18 12  CREATININE 1.24* 1.16* 1.10*  CALCIUM 8.9 8.5* 8.5*   GFR: Estimated Creatinine Clearance: 63.4 mL/min (A) (by C-G formula based on SCr of 1.1 mg/dL (H)). Liver Function Tests: Recent Labs  Lab 01/07/18 0932 01/08/18 0650  AST 19 15  ALT 12* 10*  ALKPHOS 117 105  BILITOT 0.5 0.5  PROT 6.9 6.2*  ALBUMIN 2.9* 2.5*   No results for input(s): LIPASE, AMYLASE in the last 168 hours. No results for input(s): AMMONIA in the last 168 hours. Coagulation Profile: Recent Labs  Lab 01/07/18 0932  INR 1.39   Cardiac Enzymes: No results for input(s): CKTOTAL, CKMB, CKMBINDEX, TROPONINI in the last 168 hours. BNP (last 3 results) No results for input(s): PROBNP in the last 8760 hours. HbA1C: No results for input(s): HGBA1C in the last 72 hours. CBG: No results for input(s): GLUCAP in the last 168 hours. Lipid Profile: No results for input(s): CHOL, HDL, LDLCALC, TRIG, CHOLHDL, LDLDIRECT in the last 72 hours. Thyroid Function Tests: No results for input(s): TSH, T4TOTAL, FREET4, T3FREE, THYROIDAB in the last 72 hours. Anemia Panel: No results for input(s): VITAMINB12, FOLATE, FERRITIN, TIBC, IRON, RETICCTPCT in the last 72 hours.    Radiology Studies: I have reviewed all of the imaging during this hospital visit personally     Scheduled Meds: . amLODipine  5 mg Oral Daily  . apixaban  5 mg Oral BID  . buPROPion  150 mg Oral Daily  . carvedilol  6.25 mg Oral  BID  . dofetilide  250 mcg Oral BID  . gabapentin  600 mg Oral QID  . hydrALAZINE  75 mg Oral TID  . iron polysaccharides  150 mg Oral Daily  . lidocaine-EPINEPHrine  10 mL Intradermal Once  . lisinopril  40 mg Oral Daily  . magnesium oxide  400 mg Oral Daily  . pravastatin  40 mg Oral q1800  . vortioxetine HBr  5 mg Oral Daily   Continuous Infusions: . ceFEPime (MAXIPIME) IV Stopped (01/09/18 0547)  . vancomycin Stopped (01/08/18 2245)     LOS: 2 days        Malyah Ohlrich Annett Gula, MD [facility] Pager [phone]

## 2018-01-09 NOTE — Progress Notes (Signed)
Patient ID: [name], female   DOB: 04-30-1951, 67 y.o.   MRN: [id number]   LOS: 2 days   Subjective: Yvette JonesCarolyn was admitted with right foot cellulitis. MR showed a small abscess proximal to the 2nd toe but no e/o osteo. Orthopedic surgery was contacted about treatment.   Objective: Vital signs in last 24 hours: Temp:  [98 F (36.7 C)-98.6 F (37 C)] 98.3 F (36.8 C) (04/16 1014) Pulse Rate:  [57-75] 57 (04/16 1014) Resp:  [16-18] 18 (04/16 1014) BP: (139-166)/(64-73) 152/70 (04/16 1014) SpO2:  [97 %-99 %] 99 % (04/16 1014) Weight:  [114 kg (251 lb 5.2 oz)] 114 kg (251 lb 5.2 oz) (04/16 0500) Last BM Date: 01/07/18   Laboratory  CBC Recent Labs    01/08/18 0650 01/09/18 0614  WBC 4.7 6.7  HGB 9.2* 9.4*  HCT 30.9* 31.7*  PLT 347 356   BMET Recent Labs    01/08/18 0650 01/09/18 0614  NA 140 138  K 3.4* 4.2  CL 110 109  CO2 21* 20*  GLUCOSE 100* 98  BUN 18 12  CREATININE 1.16* 1.10*  CALCIUM 8.5* 8.5*     Physical Exam General appearance: alert and no distress  RLE: Foot with chronic stable ulcerations. Sensation paresthetic. No definite fluctuance noted in area of abscess but some fullness. 2+ DP, 1+ PT pulses.   Assessment/Plan: Right foot abscess/cellulitis -- Will I&D at bedside this afternoon.    Freeman CaldronMichael J. Gurkirat Basher, PA-C Orthopedic Surgery [phone] 01/09/2018

## 2018-01-10 ENCOUNTER — Inpatient Hospital Stay: Payer: Self-pay

## 2018-01-10 ENCOUNTER — Encounter: Payer: Self-pay | Admitting: *Deleted

## 2018-01-10 DIAGNOSIS — M869 Osteomyelitis, unspecified: Secondary | ICD-10-CM

## 2018-01-10 DIAGNOSIS — L089 Local infection of the skin and subcutaneous tissue, unspecified: Secondary | ICD-10-CM

## 2018-01-10 DIAGNOSIS — I70269 Atherosclerosis of native arteries of extremities with gangrene, unspecified extremity: Secondary | ICD-10-CM

## 2018-01-10 DIAGNOSIS — L97829 Non-pressure chronic ulcer of other part of left lower leg with unspecified severity: Secondary | ICD-10-CM

## 2018-01-10 DIAGNOSIS — I10 Essential (primary) hypertension: Secondary | ICD-10-CM

## 2018-01-10 DIAGNOSIS — I480 Paroxysmal atrial fibrillation: Secondary | ICD-10-CM

## 2018-01-10 DIAGNOSIS — L97819 Non-pressure chronic ulcer of other part of right lower leg with unspecified severity: Secondary | ICD-10-CM

## 2018-01-10 DIAGNOSIS — L03031 Cellulitis of right toe: Secondary | ICD-10-CM

## 2018-01-10 DIAGNOSIS — E876 Hypokalemia: Secondary | ICD-10-CM

## 2018-01-10 DIAGNOSIS — F32A Depression, unspecified: Secondary | ICD-10-CM

## 2018-01-10 DIAGNOSIS — D649 Anemia, unspecified: Secondary | ICD-10-CM

## 2018-01-10 DIAGNOSIS — F329 Major depressive disorder, single episode, unspecified: Secondary | ICD-10-CM

## 2018-01-10 LAB — BASIC METABOLIC PANEL
Anion gap: 7 (ref 5–15)
BUN: 10 mg/dL (ref 6–20)
CHLORIDE: 106 mmol/L (ref 101–111)
CO2: 22 mmol/L (ref 22–32)
CREATININE: 1 mg/dL (ref 0.44–1.00)
Calcium: 8.6 mg/dL — ABNORMAL LOW (ref 8.9–10.3)
GFR calc non Af Amer: 57 mL/min — ABNORMAL LOW (ref >60)
Glucose, Bld: 101 mg/dL — ABNORMAL HIGH (ref 65–99)
POTASSIUM: 3.7 mmol/L (ref 3.5–5.1)
Sodium: 135 mmol/L (ref 135–145)

## 2018-01-10 LAB — CBC WITH DIFFERENTIAL/PLATELET
BASOS PCT: 0 %
Basophils Absolute: 0 10*3/uL (ref 0.0–0.1)
Eosinophils Absolute: 0 10*3/uL (ref 0.0–0.7)
Eosinophils Relative: 0 %
HEMATOCRIT: 31.8 % — AB (ref 36.0–46.0)
HEMOGLOBIN: 9.7 g/dL — AB (ref 12.0–15.0)
Lymphocytes Relative: 17 %
Lymphs Abs: 1.3 10*3/uL (ref 0.7–4.0)
MCH: 25.7 pg — ABNORMAL LOW (ref 26.0–34.0)
MCHC: 30.5 g/dL (ref 30.0–36.0)
MCV: 84.4 fL (ref 78.0–100.0)
MONOS PCT: 9 %
Monocytes Absolute: 0.6 10*3/uL (ref 0.1–1.0)
NEUTROS ABS: 5.4 10*3/uL (ref 1.7–7.7)
NEUTROS PCT: 74 %
Platelets: 344 10*3/uL (ref 150–400)
RBC: 3.77 MIL/uL — ABNORMAL LOW (ref 3.87–5.11)
RDW: 18.4 % — ABNORMAL HIGH (ref 11.5–15.5)
WBC: 7.3 10*3/uL (ref 4.0–10.5)

## 2018-01-10 MED ORDER — AMLODIPINE BESYLATE 10 MG PO TABS: 10.0000 mg | ORAL_TABLET | Freq: Every day | ORAL | Status: DC

## 2018-01-10 MED ORDER — HYDRALAZINE HCL 50 MG PO TABS
100.0000 mg | ORAL_TABLET | Freq: Three times a day (TID) | ORAL | Status: DC
  Administered 2018-01-10 (×2): 100 mg via ORAL
  Filled 2018-01-10 (×2): qty 2

## 2018-01-10 MED ORDER — POTASSIUM CHLORIDE CRYS ER 20 MEQ PO TBCR
40.0000 meq | EXTENDED_RELEASE_TABLET | Freq: Once | ORAL | Status: AC
  Administered 2018-01-10: 40 meq via ORAL
  Filled 2018-01-10: qty 2

## 2018-01-10 MED ORDER — AMLODIPINE BESYLATE 5 MG PO TABS
5.0000 mg | ORAL_TABLET | Freq: Every day | ORAL | Status: DC
  Administered 2018-01-10 – 2018-01-11 (×2): 5 mg via ORAL
  Filled 2018-01-10 (×2): qty 1

## 2018-01-10 MED ORDER — HYDRALAZINE HCL 50 MG PO TABS
75.0000 mg | ORAL_TABLET | Freq: Three times a day (TID) | ORAL | Status: DC
  Administered 2018-01-10 – 2018-01-12 (×6): 75 mg via ORAL
  Filled 2018-01-10 (×6): qty 1

## 2018-01-10 MED ORDER — PROMETHAZINE HCL 25 MG/ML IJ SOLN
12.5000 mg | Freq: Four times a day (QID) | INTRAMUSCULAR | Status: DC | PRN
Start: 2018-01-10 — End: 2018-01-12
  Administered 2018-01-10: 12.5 mg via INTRAVENOUS
  Filled 2018-01-10: qty 1

## 2018-01-10 NOTE — Progress Notes (Signed)
Pharmacy Antibiotic Note  [name] is a 67 y.o. female admitted on 01/07/2018 with foot pain.  Patient was started on empiric Vancomycin and Cefepime for R foot abscess, s/p bedside I&D 4/16.  Of note, pt also has chronic L foot wounds that are managed outpatient by ID and wound clinic.  Plan: Continue Vancomycin 1500 mg Q24hrs infuse over 4 hours for red man's  Continue Cefepime 1gm IV q8h Follow-up cx data, renal function, ID recs    Weight: 251 lb 5.2 oz (114 kg)  Temp (24hrs), Avg:98.3 F (36.8 C), Min:98.1 F (36.7 C), Max:98.6 F (37 C)  Recent Labs  Lab 01/07/18 0932 01/07/18 1018 01/07/18 1703 01/08/18 0650 01/09/18 0614 01/10/18 0639  WBC 8.6  --   --  4.7 6.7 7.3  CREATININE 1.24*  --   --  1.16* 1.10* 1.00  LATICACIDVEN  --  1.80 1.62  --   --   --     Estimated Creatinine Clearance: 69.7 mL/min (by C-G formula based on SCr of 1 mg/dL).    Allergies  Allergen Reactions  . Zolpidem Tartrate Other (See Comments)    hallucinations  . Amoxicillin Rash and Other (See Comments)    Has patient had a PCN reaction causing immediate rash, facial/tongue/throat swelling, SOB or lightheadedness with hypotension: Yes Has patient had a PCN reaction causing severe rash involving mucus membranes or skin necrosis: No Has patient had a PCN reaction that required hospitalization: No Has patient had a PCN reaction occurring within the last 10 years: Yes If all of the above answers are "NO", then may proceed with Cephalosporin use.     Antimicrobials this admission: Vanc 4/14>> LVQ 4/14>>4/15 Cefepime 4/15>>  Dose adjustments this admission:   Microbiology results: 4/10 Toe wound >> mult org 4/14 Blood >> ngtd 4/16 wound >>  Thank you for allowing pharmacy to be a part of this patient's care.  Toys 'R' UsKimberly Zadie Deemer, Pharm.D., BCPS Clinical Pharmacist Pager: [phone] Clinical phone for 01/10/2018 from 8:30-4:00 is [phone]. After 4pm, please call Main Rx  [phone]) for assistance. 01/10/2018 11:11 AM

## 2018-01-10 NOTE — Progress Notes (Signed)
Paged MD in regards to pt increased b/p/ B/P-170/79.

## 2018-01-10 NOTE — Progress Notes (Signed)
Discussed with the patient regarding care plan again for medical treatment of her osteomyelitis of the left foot. With her desire to avoid amputation will go through with PICC line placement and prolonged IV therapy.   She will need education on how to administer herself as she does not have much help available to her - I told her that this was something we can arrange for her. For ease of dosing likely regimen would be IV Vancomycin, Ceftriaxone and PO metronidazole. Recent wound swab of right soft tissue wound polymicrobial and higher suspicion for anaerobes considering chronicity of wounds.   Will place PICC order today - she would like to have this inserted tomorrow if possible.   Rexene AlbertsStephanie Kamile Fassler, MSN, NP-C [facility] Office: [phone] Pager: [phone]  01/10/2018  1:03 PM

## 2018-01-10 NOTE — Progress Notes (Signed)
PROGRESS NOTE    AI SONNENFELD  ZOX:[id number] DOB: [birth year] DOA: 01/07/2018 PCP: Jarome Matin, MD   Brief Narrative:  67 year old female who presented with right foot swelling and pain. She does have a significant past medical history for hypertension, dyslipidemia and obstructive sleep apnea. Patient developed right foot second toe edema, erythema and tenderness. On her initial physical examination blood pressure 121/69, heart rate 59, temperature 98.1, respiratory rate 18, oxygen saturation 100%. Moist mucous membranes, lungs clear to auscultation, heart S1-S2 present rhythmic, the abdomen was soft nontender, lower extremities no edema. Right foot second toe with erythema and edema, active bleeding at the site of the rash.Sodium 136, potassium 3.5, chloride 107, bicarb 17, glucose 106, BUN 22, creatinine 1.24, white count 8.6, hemoglobin 10.0, hematocrit 33.2, platelets 366.X-ray with persistent prominent plantar soft tissue swelling deep to the first MTP joint with a few foci of subcutaneous emphysema, consistent with soft tissue infection. No definite radiographic evidence of osteomyelitis.  Patient was admitted to the hospital with right foot cellulitis rule out osteomyelitis at the right foot.       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  Cultures pending.  MRI negative for osteomyelitis however noted a small abscess at the first metatarsal region.  Continue empiric IV vancomycin and IV cefepime.  ID following.  PICC line ordered per ID as they feel patient will require few weeks of IV antibiotics.  ID and Ortho following.  2.  Hypertension Patient  noted to have systolic blood pressures from the 150s to the 170s.  Hydralazine was increased this morning 200 mg 3 times daily and Norvasc increased to 10 mg daily.  Systolic blood pressure this afternoon in the 100s.  Decrease Norvasc back to home regimen of 5 mg daily and decrease hydralazine back to home regimen of 75 mg 3 times daily.  Follow.  3.  Hyperlipidemia Continue statin.  4.  Paroxysmal atrial fibrillation Currently rate controlled on Coreg and dofetilide.  Apixaban for anticoagulation.  5.  Depression/anxiety Stable.  Continue mirtazapine and bupropion.  6.  Hypokalemia We will give a dose of potassium 40 mEq daily to keep potassium greater than 4 as patient on Tikosyn.  7.  Anemia H&H stable.  No bleeding noted.   DVT prophylaxis: Eliquis Code Status: Full Family Communication: updated patient.  No family at bedside. Disposition Plan: Home when improved clinically.   Consultants:   Wound Care RN, Helmut Muster 01/09/2018  Infectious disease: Dr. Ninetta Lights 01/09/2018    Procedures:   Plain Films of the right foot 01/07/2018  MRI right foot 01/08/2018  Right foot I and D Earney Hamburg, PA 01/09/2018  Antimicrobials:   IV cefepime 01/08/2018  IV Levaquin 01/07/2018>>>> 01/08/2018  IV vancomycin 01/07/2018   Subjective: Patient sleeping easily arousable.  Denies any chest pain or shortness of breath.  States bandage recently changed on her foot.  Objective: Vitals:   01/10/18 0248 01/10/18 0443 01/10/18 0910 01/10/18 1829  BP:  (!) 170/79 139/79 (!) 101/52  Pulse:  85 65 68  Resp:  19 18 18   Temp:  98.6 F (37 C) 98.2 F (36.8 C) 98.2 F (36.8 C)  TempSrc:  Oral Oral Oral  SpO2:  97% 99% 99%  Weight: 114 kg (251 lb 5.2 oz)       Intake/Output Summary (Last 24 hours) at 01/10/2018 1850 Last data filed at 01/10/2018 1300 Gross per 24 hour  Intake 820 ml  Output 0 ml  Net 820 ml   Filed Weights   01/09/18 0500 01/09/18 2052 01/10/18 0248  Weight:  114 kg (251 lb 5.2 oz) 114 kg (251 lb 5.3 oz) 114 kg (251 lb 5.2 oz)    Examination:  General exam: Appears calm and comfortable  Respiratory system: Clear to auscultation. Respiratory effort normal. Cardiovascular system: S1 & S2 heard, 3/6 SEM. No JVD, rubs, gallops or clicks. No pedal edema. Gastrointestinal system: Abdomen is nondistended, soft and nontender. No organomegaly or masses felt. Normal bowel sounds heard. Central nervous system: Alert and oriented. No focal neurological deficits. Extremities: Lower extremity is bandaged.  Patient with plantar ulcers at base of first metatarsal on the right, discoloration of the second toe on the right. Skin: No rashes, lesions or ulcers Psychiatry: Judgement and insight appear normal. Mood & affect appropriate.     Data Reviewed: I have personally reviewed following labs and imaging studies  CBC: Recent Labs  Lab 01/07/18 0932 01/08/18 0650 01/09/18 0614 01/10/18 0639  WBC 8.6 4.7 6.7 7.3  NEUTROABS 6.4  --  4.6 5.4  HGB 10.0* 9.2* 9.4* 9.7*  HCT 33.2* 30.9* 31.7* 31.8*  MCV 83.4 84.9 85.0 84.4  PLT 366 347 356 344   Basic Metabolic Panel: Recent Labs  Lab 01/07/18 0932 01/08/18 0650 01/09/18 0614 01/10/18 0639  NA 136 140 138 135  K 3.5 3.4* 4.2 3.7  CL 107 110 109 106  CO2 17* 21* 20* 22  GLUCOSE 106* 100* 98 101*  BUN 22* 18 12 10   CREATININE 1.24* 1.16* 1.10* 1.00  CALCIUM 8.9 8.5* 8.5* 8.6*   GFR: Estimated Creatinine Clearance: 69.7 mL/min (by C-G formula based on SCr of 1 mg/dL). Liver Function Tests: Recent Labs  Lab 01/07/18 0932 01/08/18 0650  AST 19 15  ALT 12* 10*  ALKPHOS 117 105  BILITOT 0.5 0.5  PROT 6.9 6.2*  ALBUMIN 2.9* 2.5*   No results for input(s): LIPASE, AMYLASE in the last 168 hours. No results for input(s): AMMONIA in the last 168 hours. Coagulation Profile: Recent Labs  Lab 01/07/18 0932  INR 1.39   Cardiac Enzymes: No results for input(s): CKTOTAL, CKMB, CKMBINDEX,  TROPONINI in the last 168 hours. BNP (last 3 results) No results for input(s): PROBNP in the last 8760 hours. HbA1C: No results for input(s): HGBA1C in the last 72 hours. CBG: No results for input(s): GLUCAP in the last 168 hours. Lipid Profile: No results for input(s): CHOL, HDL, LDLCALC, TRIG, CHOLHDL, LDLDIRECT in the last 72 hours. Thyroid Function Tests: No results for input(s): TSH, T4TOTAL, FREET4, T3FREE, THYROIDAB in the last 72 hours. Anemia Panel: No results for input(s): VITAMINB12, FOLATE, FERRITIN, TIBC, IRON, RETICCTPCT in the last 72 hours. Sepsis Labs: Recent Labs  Lab 01/07/18 1018 01/07/18 1703  LATICACIDVEN 1.80 1.62    Recent Results (from the past 240 hour(s))  Aerobic Culture (superficial specimen)     Status: Abnormal   Collection Time: 01/03/18 10:20 AM  Result Value Ref Range Status   Specimen Description   Final    TOE RIGHT 1ST METATARSAL HEAD Performed at [facility], [address] [place], [place] [address]    Special Requests   Final  NONE Performed at [facility], [address], [place], [place] [address]    Gram Stain   Final    ABUNDANT WBC PRESENT,BOTH PMN AND MONONUCLEAR RARE GRAM POSITIVE COCCI RARE GRAM VARIABLE ROD Performed at [facility], [address] [place], [place] [address]    Culture MULTIPLE ORGANISMS PRESENT, NONE PREDOMINANT (A)  Final   Report Status 01/06/2018 FINAL  Final  Culture, blood (Routine x 2)     Status: None (Preliminary result)   Collection Time: 01/07/18 10:00 AM  Result Value Ref Range Status   Specimen Description BLOOD LEFT ARM  Final   Special Requests   Final    BOTTLES DRAWN AEROBIC AND ANAEROBIC Blood Culture results may not be optimal due to an inadequate volume of blood received in culture bottles   Culture   Final    NO GROWTH 3 DAYS Performed at [facility], [address] [place], [place] [address]    Report Status PENDING   Incomplete  Culture, blood (Routine x 2)     Status: None (Preliminary result)   Collection Time: 01/07/18 10:04 AM  Result Value Ref Range Status   Specimen Description BLOOD RIGHT HAND  Final   Special Requests   Final    BOTTLES DRAWN AEROBIC AND ANAEROBIC Blood Culture results may not be optimal due to an inadequate volume of blood received in culture bottles   Culture   Final    NO GROWTH 3 DAYS Performed at [facility], [address], [place], [place] [address]    Report Status PENDING  Incomplete  Aerobic Culture (superficial specimen)     Status: None (Preliminary result)   Collection Time: 01/09/18  2:20 PM  Result Value Ref Range Status   Specimen Description WOUND RIGHT FOOT  Final   Special Requests NONE  Final   Gram Stain   Final    ABUNDANT WBC PRESENT, PREDOMINANTLY PMN NO ORGANISMS SEEN    Culture   Final    NO GROWTH < 24 HOURS Performed at [facility], [address] [place], [place] [address]    Report Status PENDING  Incomplete         Radiology Studies: [place] Ekg Site Rite  Result Date: 01/10/2018 If Site Rite image not attached, placement could not be confirmed due to current cardiac rhythm.       Scheduled Meds: . amLODipine  10 mg Oral Daily  . apixaban  5 mg Oral BID  . buPROPion  150 mg Oral Daily  . carvedilol  6.25 mg Oral BID  . dofetilide  250 mcg Oral BID  . gabapentin  600 mg Oral QID  . hydrALAZINE  100 mg Oral TID  . iron polysaccharides  150 mg Oral Daily  . lidocaine-EPINEPHrine  10 mL Intradermal Once  . lisinopril  40 mg Oral Daily  . magnesium oxide  400 mg Oral Daily  . pravastatin  40 mg Oral q1800  . vortioxetine HBr  5 mg Oral QHS   Continuous Infusions: . ceFEPime (MAXIPIME) IV 1 g (01/10/18 1632)  . vancomycin Stopped (01/10/18 0010)     LOS: 3 days    Time spent: 35 minutes    Ramiro Harvest, MD [facility] Pager [phone]  If 7PM-7AM, please contact  night-coverage [email or web address] Password [email or web address] 01/10/2018, 6:50 PM

## 2018-01-10 NOTE — Consult Note (Signed)
   [facility]   01/10/2018  [name] 1951/08/26 [id number]   Patient screened for potential [facility] Care [facility] program needs.   Went to bedside to discuss [facility].   [name] endorses she lives alone.  States her friends assist with transportation to MD appointments if needed. States she is concerned with the costs of her medications such as Tikosyn and Eliquis. Reports she is not sure when she will be in the doughnut hole. However, she is concerned her medications will be more than what she can afford.  Goes to [facility] on [address].  Primary Care MD is Dr. Eloise HarmanPaterson.  [name] denies needed Telephonic RNCM follow up. However, she states pharmacy is her main concern.  Agreeable to General EMMI post discharge calls. Agreeable to [facility] referral for coverage gaps for Tikosyn and Eliquis and medication review.   Confirmed best contact number as [phone].  [facility] written consent obtained and folder provided.   Made inpatient RNCM aware of above.   [name], MSN-Ed, RN,BSN [facility] Care [facility] Hospital Liaison [phone]

## 2018-01-10 NOTE — Progress Notes (Signed)
PT Cancellation Note  Patient Details Name: [name] MRN: [id number] DOB: 07/18/1951   Cancelled Treatment:    Reason Eval/Treat Not Completed: Patient declined, no reason specified Patient politely declines PT due to nausea following medications earlier, requests that PT return later if able. Plan to try to attempt to return if/as schedule allows, otherwise will try to work with patient on next set day of service.    Nedra HaiKristen Unger PT, DPT, CBIS  Supplemental Physical Therapist [facility]   Pager [phone]

## 2018-01-10 NOTE — Progress Notes (Addendum)
Subjective:     Patient reports pain as mild.  Tolerating POs well.  Denies fever, chills, N/V, CP, SOB.  Resting comfortably in bed.  Objective:   VITALS:  Temp:  [98.1 F (36.7 C)-98.6 F (37 C)] 98.6 F (37 C) (04/17 0443) Pulse Rate:  [57-85] 85 (04/17 0443) Resp:  [17-19] 19 (04/17 0443) BP: (152-170)/(70-79) 170/79 (04/17 0443) SpO2:  [97 %-100 %] 97 % (04/17 0443) Weight:  [114 kg (251 lb 5.2 oz)-114 kg (251 lb 5.3 oz)] 114 kg (251 lb 5.2 oz) (04/17 0248)  General: WDWN patient in NAD. Psych:  Appropriate mood and affect. Neuro:  A&O x 3, Moving all extremities, sensation intact to light touch, however subjectively diminished due underlying idiopathic peripheral neuropathy HEENT:  EOMs intact Chest:  Even non-labored respirations Skin:  Dressing and iodoform packing removed. 2cm x 1cm x 1 cm dorsal forefoot abscess.  No purulence or malodorous aroma.  Scant serosanguinous fluid. Extremities: warm/dry, mild edema erythema around the border of the wound.  No echymosis.  No lymphadenopathy. 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 WBAT B LE in post-op shoe. ABX per ID Left foot wound care [facility]. Cultures pending  Alfredo MartinezJustin Ollis PA-C [facility] Office:  [phone]

## 2018-01-10 NOTE — Progress Notes (Signed)
[facility]  Date of Admission:  01/07/2018     Total days of antibiotics 3  Day 3 Vancomycin   Day 2 cefepime          Patient ID: [name] is a 67 y.o. 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 amLODipine  10 mg Oral Daily  . apixaban  5 mg Oral BID  . buPROPion  150 mg Oral Daily  . carvedilol  6.25 mg Oral BID  . dofetilide  250 mcg Oral BID  . gabapentin  600 mg Oral QID  . hydrALAZINE  100 mg Oral TID  . iron polysaccharides  150 mg Oral Daily  . lidocaine-EPINEPHrine  10 mL Intradermal Once  . lisinopril  40 mg Oral Daily  . magnesium oxide  400 mg Oral Daily  . pravastatin  40 mg Oral q1800  . vortioxetine HBr  5 mg Oral QHS    SUBJECTIVE: Feeling well today.   Allergies  Allergen Reactions  . Zolpidem Tartrate Other (See Comments)    hallucinations  . Amoxicillin Rash and Other (See Comments)    Has patient had a PCN reaction causing immediate rash, facial/tongue/throat swelling, SOB or lightheadedness with hypotension: Yes Has patient had a PCN reaction causing severe rash involving mucus membranes or skin necrosis: No Has patient had a PCN reaction that required hospitalization: No Has patient had a PCN reaction occurring within the last 10 years: Yes If all of the above answers are "NO", then may proceed with Cephalosporin use.     OBJECTIVE: Vitals:   01/09/18 2052 01/10/18 0248 01/10/18 0443 01/10/18 0910  BP: (!) 170/71  (!) 170/79 139/79  Pulse: 76  85 65  Resp: 17  19 18   Temp: 98.1 F (36.7 C)  98.6 F (37 C) 98.2 F (36.8 C)  TempSrc: Oral  Oral Oral  SpO2: 100%  97% 99%  Weight: 251 lb 5.3 oz (114 kg) 251 lb 5.2 oz (114 kg)     Body mass index is 41.82 kg/m.  Physical Exam  Constitutional: She is oriented to person, place, and time.  HENT:    Mouth/Throat: No oral lesions. No dental abscesses.  Cardiovascular: Normal rate, regular rhythm, normal heart sounds and intact distal pulses.  Pulmonary/Chest: Effort normal and breath sounds normal.  Abdominal: Soft. She exhibits no distension. There is no tenderness.  Musculoskeletal: Normal range of motion. She exhibits no tenderness.  No changes to plantar ulcerations bilaterally. Incised right toe with some slight erythema and some bleeding.  Lymphadenopathy:    She has no cervical adenopathy.  Neurological: She is alert and oriented to person, place, and time.  Skin: Skin is warm and dry. No rash noted.  Psychiatric: Judgment normal.  Nursing note and vitals reviewed.   Lab Results Lab Results  Component Value Date   WBC 7.3 01/10/2018   HGB 9.7 (L) 01/10/2018   HCT 31.8 (L) 01/10/2018   MCV 84.4 01/10/2018   PLT 344 01/10/2018    Lab Results  Component Value Date   CREATININE 1.00 01/10/2018   BUN 10 01/10/2018   NA 135 01/10/2018   K 3.7 01/10/2018   CL 106 01/10/2018   CO2 22 01/10/2018    Lab Results  Component Value Date   ALT 10 (L)  01/08/2018   AST 15 01/08/2018   ALKPHOS 105 01/08/2018   BILITOT 0.5 01/08/2018     Microbiology: Recent Results (from the past 240 hour(s))  Aerobic Culture (superficial specimen)     Status: Abnormal   Collection Time: 01/03/18 10:20 AM  Result Value Ref Range Status   Specimen Description   Final    TOE RIGHT 1ST METATARSAL HEAD Performed at [facility], [address] [place], [place] [address]    Special Requests   Final    NONE Performed at [facility], [address] [place], [place] [address]    Gram Stain   Final    ABUNDANT WBC PRESENT,BOTH PMN AND MONONUCLEAR RARE GRAM POSITIVE COCCI RARE GRAM VARIABLE ROD Performed at [facility], [address] [place], [place] [address]    Culture MULTIPLE ORGANISMS PRESENT, NONE PREDOMINANT (A)  Final   Report  Status 01/06/2018 FINAL  Final  Culture, blood (Routine x 2)     Status: None (Preliminary result)   Collection Time: 01/07/18 10:00 AM  Result Value Ref Range Status   Specimen Description BLOOD LEFT ARM  Final   Special Requests   Final    BOTTLES DRAWN AEROBIC AND ANAEROBIC Blood Culture results may not be optimal due to an inadequate volume of blood received in culture bottles   Culture   Final    NO GROWTH 2 DAYS Performed at [facility], [address] [place], [place] [address]    Report Status PENDING  Incomplete  Culture, blood (Routine x 2)     Status: None (Preliminary result)   Collection Time: 01/07/18 10:04 AM  Result Value Ref Range Status   Specimen Description BLOOD RIGHT HAND  Final   Special Requests   Final    BOTTLES DRAWN AEROBIC AND ANAEROBIC Blood Culture results may not be optimal due to an inadequate volume of blood received in culture bottles   Culture   Final    NO GROWTH 2 DAYS Performed at [facility], [address] [place], [place] [address]    Report Status PENDING  Incomplete  Aerobic Culture (superficial specimen)     Status: None (Preliminary result)   Collection Time: 01/09/18  2:20 PM  Result Value Ref Range Status   Specimen Description WOUND RIGHT FOOT  Final   Special Requests NONE  Final   Gram Stain   Final    ABUNDANT WBC PRESENT, PREDOMINANTLY PMN NO ORGANISMS SEEN Performed at [facility], [address] [place], [place] [address]    Culture PENDING  Incomplete   Report Status PENDING  Incomplete    ASSESSMENT: 67 y.o. female with chronic ulcerations of bilateral feet. She is 1 day s/p I&D of right #2 toe - site looks good, slightly pink but overall improved. No changes to other ulcerations on plantar aspects of either foot and specifically no drainage or odor. She does have pain with walking but none at rest. Vascular status to both feet appears adequate with good pulses, normal cap refill and warm. We  discussed options for treatment today including definitive cure being amputation; she is not interested in amputation at this time and would like to try antibiotic management with wound care first. So far no growth from I&D of right soft tissue abscess. Gram stain is also negative.   PLAN: 1. Continue IV vancomycin and cefepime 2. Further recs pending outpatient treatment to follow.   Judeth CornfieldStephanie  Durwin Nora, MSN, NP-C [facility] Cell: [phone] Pager: [phone]  01/10/2018  11:32 AM

## 2018-01-11 ENCOUNTER — Inpatient Hospital Stay: Payer: Self-pay

## 2018-01-11 ENCOUNTER — Other Ambulatory Visit: Payer: Self-pay

## 2018-01-11 DIAGNOSIS — L03115 Cellulitis of right lower limb: Secondary | ICD-10-CM

## 2018-01-11 LAB — BASIC METABOLIC PANEL
Anion gap: 8 (ref 5–15)
BUN: 10 mg/dL (ref 6–20)
CALCIUM: 8.4 mg/dL — AB (ref 8.9–10.3)
CO2: 22 mmol/L (ref 22–32)
CREATININE: 1.04 mg/dL — AB (ref 0.44–1.00)
Chloride: 109 mmol/L (ref 101–111)
GFR calc Af Amer: >60 mL/min (ref >60)
GFR, EST NON AFRICAN AMERICAN: 55 mL/min — AB (ref >60)
GLUCOSE: 105 mg/dL — AB (ref 65–99)
Potassium: 4 mmol/L (ref 3.5–5.1)
SODIUM: 139 mmol/L (ref 135–145)

## 2018-01-11 LAB — CBC
HCT: 33 % — ABNORMAL LOW (ref 36.0–46.0)
Hemoglobin: 9.7 g/dL — ABNORMAL LOW (ref 12.0–15.0)
MCH: 25.1 pg — AB (ref 26.0–34.0)
MCHC: 29.4 g/dL — ABNORMAL LOW (ref 30.0–36.0)
MCV: 85.3 fL (ref 78.0–100.0)
PLATELETS: 324 10*3/uL (ref 150–400)
RBC: 3.87 MIL/uL (ref 3.87–5.11)
RDW: 18.4 % — AB (ref 11.5–15.5)
WBC: 6.7 10*3/uL (ref 4.0–10.5)

## 2018-01-11 LAB — AEROBIC CULTURE W GRAM STAIN (SUPERFICIAL SPECIMEN)

## 2018-01-11 LAB — MAGNESIUM: Magnesium: 2 mg/dL (ref 1.7–2.4)

## 2018-01-11 LAB — AEROBIC CULTURE  (SUPERFICIAL SPECIMEN): CULTURE: NO GROWTH

## 2018-01-11 MED ORDER — SODIUM CHLORIDE 0.9% FLUSH: 10.0000 mL | INTRAVENOUS | Status: DC | PRN

## 2018-01-11 MED ORDER — POTASSIUM CHLORIDE CRYS ER 20 MEQ PO TBCR
20.0000 meq | EXTENDED_RELEASE_TABLET | Freq: Once | ORAL | Status: AC
  Administered 2018-01-11: 20 meq via ORAL
  Filled 2018-01-11: qty 1

## 2018-01-11 NOTE — Telephone Encounter (Signed)
The following faxed to companies for medication assistance:  [employer] for [employer] for [email or web address]

## 2018-01-11 NOTE — Evaluation (Signed)
Physical Therapy Evaluation Patient Details Name: Yvette ShutterCarolyn I Orozco MRN: [id number] DOB: January 20, 1951 Today's Date: 01/11/2018   History of Present Illness   [name]  is a 67 y.o. female,   w hypertension, hyperlipidemia, OSA; pt adm 01/07/18 with  Osteomyelitis of the left foot,  with c/o bleeding of the right 2nd toe and swelling of the right foot.    Clinical Impression  Pt admitted with above diagnosis. Pt currently with functional limitations due to the deficits listed below (see PT Problem List).  Pt is independent/modified Independent at her  Baseline, presents today with mildly unsteady gait, decr balance and decr activity tolerance--will benefit from PT in acute setting to maximize independence prior to return home.  Pt will benefit from skilled PT to increase their independence and safety with mobility to allow discharge to the venue listed below.       Follow Up Recommendations Home health PT    Equipment Recommendations  None recommended by PT    Recommendations for Other Services       Precautions / Restrictions Precautions Precautions: Fall Restrictions Weight Bearing Restrictions: No Other Position/Activity Restrictions: WBAT R foot      Mobility  Bed Mobility Overal bed mobility: Needs Assistance Bed Mobility: Sit to Supine       Sit to supine: Supervision   General bed mobility comments: for safety, no physical assist  Transfers Overall transfer level: Needs assistance Equipment used: Rolling walker (2 wheeled) Transfers: Sit to/from Stand Sit to Stand: Min guard         General transfer comment: for safety, incr time to rise, cues to control descent  Ambulation/Gait Ambulation/Gait assistance: Min guard Ambulation Distance (Feet): 25 Feet(in room, pt declined greater distance) Assistive device: Rolling walker (2 wheeled) Gait Pattern/deviations: Step-through pattern;Decreased stride length;Wide base of support;Decreased weight shift to  right     General Gait Details: min/guard for balance, cues for RW position during turns, mild unsteadiness d/t bandaging RLE/foot but no overt LOB  Stairs            Wheelchair Mobility    Modified Rankin (Stroke Patients Only)       Balance Overall balance assessment: Needs assistance   Sitting balance-Leahy Scale: Fair       Standing balance-Leahy Scale: Fair Standing balance comment: reliant on UE support for dynamic balance                             Pertinent Vitals/Pain Pain Assessment: No/denies pain(reports chronic back pain)    Home Living Family/patient expects to be discharged to:: Private residence Living Arrangements: Alone   Type of Home: House Home Access: Ramped entrance     Home Layout: One level Home Equipment: Bedside commode;Shower seat;Grab bars - tub/shower;Walker - 2 wheels;Walker - 4 wheels Additional Comments: doesn't use BSC, was doing HHPT/OT prior to adm    Prior Function Level of Independence: Independent with assistive device(s)         Comments: amb with cane     Hand Dominance   Dominant Hand: Right    Extremity/Trunk Assessment   Upper Extremity Assessment Upper Extremity Assessment: Defer to OT evaluation(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, moves through functional range with incr time)    Lower Extremity Assessment Lower Extremity Assessment: Generalized weakness    Cervical / Trunk Assessment Cervical / Trunk Assessment: Kyphotic  Communication   Communication: No difficulties  Cognition Arousal/Alertness: Awake/alert Behavior [facility] for  tasks assessed/performed Overall Cognitive Status: Within Functional Limits for tasks assessed                                        General Comments      Exercises     Assessment/Plan    PT Assessment Patient needs continued PT services  PT Problem List Decreased strength;Decreased activity tolerance;Decreased balance;Decreased  knowledge of use of DME;Decreased mobility       PT Treatment Interventions DME instruction;Gait training;Functional mobility training;Therapeutic activities;Therapeutic exercise;Patient/family education;Balance training    PT Goals (Current goals can be found in the Care Plan section)  Acute Rehab PT Goals Patient Stated Goal: foot healed, get back home PT Goal Formulation: With patient Time For Goal Achievement: 01/25/18 Potential to Achieve Goals: Good    Frequency Min 3X/week   Barriers to discharge        Co-evaluation               AM-PAC PT "6 Clicks" Daily Activity  Outcome Measure Difficulty turning over in bed (including adjusting bedclothes, sheets and blankets)?: A Little Difficulty moving from lying on back to sitting on the side of the bed? : A Little Difficulty sitting down on and standing up from a chair with arms (e.g., wheelchair, bedside commode, etc,.)?: A Little Help needed moving to and from a bed to chair (including a wheelchair)?: A Little Help needed walking in hospital room?: A Little Help needed climbing 3-5 steps with a railing? : A Lot 6 Click Score: 17    End of Session Equipment Utilized During Treatment: Gait belt Activity Tolerance: Patient tolerated treatment well Patient left: in bed;with call bell/phone within reach;Other (comment)(IV team)   PT Visit Diagnosis: Difficulty in walking, not elsewhere classified (R26.2)    Time: 4098-1191 PT Time Calculation (min) (ACUTE ONLY): 14 min   Charges:   PT Evaluation $PT Eval Low Complexity: 1 Low     PT G CodesDrucilla Chalet, PT Pager: [phone] 01/11/2018   [facility] 01/11/2018, 10:57 AM

## 2018-01-11 NOTE — Progress Notes (Signed)
Peripherally Inserted Central Catheter/Midline Placement  The IV Nurse has discussed with the patient and/or persons authorized to consent for the patient, the purpose of this procedure and the potential benefits and risks involved with this procedure.  The benefits include less needle sticks, lab draws from the catheter, and the patient may be discharged home with the catheter. Risks include, but not limited to, infection, bleeding, blood clot (thrombus formation), and puncture of an artery; nerve damage and irregular heartbeat and possibility to perform a PICC exchange if needed/ordered by physician.  Alternatives to this procedure were also discussed.  Bard Power PICC patient education guide, fact sheet on infection prevention and patient information card has been provided to patient /or left at bedside.    PICC/Midline Placement Documentation  PICC Single Lumen 01/11/18 PICC Right Basilic 37 cm 4 cm (Active)  Indication for Insertion or Continuance of Line Home intravenous therapies (PICC only) 01/11/2018  9:00 AM  Exposed Catheter (cm) 4 cm 01/11/2018  9:00 AM  Dressing Change Due 01/18/18 01/11/2018  9:00 AM     Consent obtained by Arlina RobesLinda Alford on 01/10/18. Pt did not have any questions and agreed to have PICC placed on 01/11/18.  Consuello Masseimmons, Angello Chien M 01/11/2018, 9:15 AM

## 2018-01-11 NOTE — Progress Notes (Signed)
Subjective:    Pt without complaints.  Right foot wound dressing changed by Mr. Veleta MinersOllis.  Today dressing changed by RN.  [facility]  Objective: Vital signs in last 24 hours: Temp:  [98.2 F (36.8 C)-98.5 F (36.9 C)] 98.5 F (36.9 C) (04/18 0435) Pulse Rate:  [68-75] 75 (04/18 0435) Resp:  [18] 18 (04/18 0435) BP: (101-152)/(52-80) 136/80 (04/18 0435) SpO2:  [97 %-99 %] 97 % (04/18 0435) Weight:  [114 kg (251 lb 5.2 oz)] 114 kg (251 lb 5.2 oz) (04/17 2127)  Intake/Output from previous day: 04/17 0701 - 04/18 0700 In: 2120 [P.O.:1320; IV Piggyback:800] Out: 0  Intake/Output this shift: Total I/O In: 240 [P.O.:240] Out: 0   Recent Labs    01/09/18 0614 01/10/18 0639 01/11/18 0618  HGB 9.4* 9.7* 9.7*   Recent Labs    01/10/18 0639 01/11/18 0618  WBC 7.3 6.7  RBC 3.77* 3.87  HCT 31.8* 33.0*  PLT 344 324   Recent Labs    01/10/18 0639 01/11/18 0618  NA 135 139  K 3.7 4.0  CL 106 109  CO2 22 22  BUN 10 10  CREATININE 1.00 1.04*  GLUCOSE 101* 105*  CALCIUM 8.6* 8.4*   No results for input(s): LABPT, INR in the last 72 hours.   Assessment/Plan:    IV abx per hospitalist.  Wound care orders entered.  [facility] post discharge dressing changes.  Pt is WBAT bilat LEs.  F/u with me in the office in two weeks.  Ortho signing off.  Call [phone] with any ortho questions.    Toni ArthursHEWITT, Farran Amsden 01/11/2018, 4:21 PM

## 2018-01-11 NOTE — Care Management Important Message (Signed)
Important Message  Patient Details  Name: [name] MRN: [id number] Date of Birth: July 13, 1951   Medicare Important Message Given:  Yes    Arling Cerone 01/11/2018, 3:01 PM

## 2018-01-11 NOTE — Progress Notes (Signed)
PHARMACY CONSULT NOTE FOR:  OUTPATIENT  PARENTERAL ANTIBIOTIC THERAPY (OPAT)  Indication: R foot cellulitis and abscess Regimen: Cefepime 1g q8 hours End date: 02/04/18  IV antibiotic discharge orders are pended. To discharging provider:  please sign these orders via discharge navigator,  Select New Orders & click on the button choice - Manage This Unsigned Work.     Thank you for allowing pharmacy to be a part of this patient's care.  Sheppard CoilFrank Wilson PharmD., BCPS Clinical Pharmacist 01/11/2018 3:57 PM

## 2018-01-11 NOTE — Progress Notes (Signed)
INFECTIOUS DISEASE PROGRESS NOTE  ID: [name] is a 67 y.o. female with  Active Problems:   Atherosclerotic peripheral vascular disease with ulceration ([place])   Toe infection   Neuropathy   Osteomyelitis of fifth toe of left foot (HCC)   Depression   Hypokalemia   Anemia  Subjective: No complaints  Abtx:  Anti-infectives (From admission, onward)   Start     Dose/Rate Route Frequency Ordered Stop   01/08/18 1700  vancomycin (VANCOCIN) 1,500 mg in sodium chloride 0.9 % 500 mL IVPB  Status:  Discontinued     1,500 mg 250 mL/hr over 120 Minutes Intravenous Every 24 hours 01/07/18 1606 01/08/18 1105   01/08/18 1700  vancomycin (VANCOCIN) 1,500 mg in sodium chloride 0.9 % 500 mL IVPB     1,500 mg 125 mL/hr over 240 Minutes Intravenous Every 24 hours 01/08/18 1105     01/08/18 1400  ceFEPIme (MAXIPIME) 1 g in sodium chloride 0.9 % 100 mL IVPB     1 g 200 mL/hr over 30 Minutes Intravenous Every 8 hours 01/08/18 1112     01/07/18 1900  levofloxacin (LEVAQUIN) IVPB 500 mg  Status:  Discontinued     500 mg 100 mL/hr over 60 Minutes Intravenous Every 24 hours 01/07/18 1756 01/08/18 1112   01/07/18 1615  vancomycin (VANCOCIN) 2,000 mg in sodium chloride 0.9 % 500 mL IVPB     2,000 mg 250 mL/hr over 120 Minutes Intravenous  Once 01/07/18 1605 01/07/18 2100      Medications:  Scheduled: . amLODipine  5 mg Oral Daily  . apixaban  5 mg Oral BID  . buPROPion  150 mg Oral Daily  . carvedilol  6.25 mg Oral BID  . dofetilide  250 mcg Oral BID  . gabapentin  600 mg Oral QID  . hydrALAZINE  75 mg Oral TID  . iron polysaccharides  150 mg Oral Daily  . lidocaine-EPINEPHrine  10 mL Intradermal Once  . lisinopril  40 mg Oral Daily  . magnesium oxide  400 mg Oral Daily  . pravastatin  40 mg Oral q1800  . vortioxetine HBr  5 mg Oral QHS    Objective: Vital signs in last 24 hours: Temp:  [98.2 F (36.8 C)-98.5 F (36.9 C)] 98.5 F (36.9 C) (04/18 0435) Pulse Rate:  [68-75] 75  (04/18 0435) Resp:  [18] 18 (04/18 0435) BP: (101-152)/(52-80) 136/80 (04/18 0435) SpO2:  [97 %-99 %] 97 % (04/18 0435) Weight:  [114 kg (251 lb 5.2 oz)] 114 kg (251 lb 5.2 oz) (04/17 2127)   General appearance: alert, cooperative and no distress Incision/Wound: dressed  Lab Results Recent Labs    01/10/18 0639 01/11/18 0618  WBC 7.3 6.7  HGB 9.7* 9.7*  HCT 31.8* 33.0*  NA 135 139  K 3.7 4.0  CL 106 109  CO2 22 22  BUN 10 10  CREATININE 1.00 1.04*   Liver Panel No results for input(s): PROT, ALBUMIN, AST, ALT, ALKPHOS, BILITOT, BILIDIR, IBILI in the last 72 hours. Sedimentation Rate No results for input(s): ESRSEDRATE in the last 72 hours. C-Reactive Protein No results for input(s): CRP in the last 72 hours.  Microbiology: Recent Results (from the past 240 hour(s))  Aerobic Culture (superficial specimen)     Status: Abnormal   Collection Time: 01/03/18 10:20 AM  Result Value Ref Range Status   Specimen Description   Final    TOE RIGHT 1ST METATARSAL HEAD Performed at [facility]  [address] [place], [place] [address]    Special Requests   Final    NONE Performed at [facility], [place] [address], [place], [place] [address]    Gram Stain   Final    ABUNDANT WBC PRESENT,BOTH PMN AND MONONUCLEAR RARE GRAM POSITIVE COCCI RARE GRAM VARIABLE ROD Performed at [facility], [place] [address] [place], [place] [address]    Culture MULTIPLE ORGANISMS PRESENT, NONE PREDOMINANT (A)  Final   Report Status 01/06/2018 FINAL  Final  Culture, blood (Routine x 2)     Status: None (Preliminary result)   Collection Time: 01/07/18 10:00 AM  Result Value Ref Range Status   Specimen Description BLOOD LEFT ARM  Final   Special Requests   Final    BOTTLES DRAWN AEROBIC AND ANAEROBIC Blood Culture results may not be optimal due to an inadequate volume of blood received in culture bottles   Culture   Final    NO GROWTH 4 DAYS Performed  at [facility], [place] [address] [place], [place] [address]    Report Status PENDING  Incomplete  Culture, blood (Routine x 2)     Status: None (Preliminary result)   Collection Time: 01/07/18 10:04 AM  Result Value Ref Range Status   Specimen Description BLOOD RIGHT HAND  Final   Special Requests   Final    BOTTLES DRAWN AEROBIC AND ANAEROBIC Blood Culture results may not be optimal due to an inadequate volume of blood received in culture bottles   Culture   Final    NO GROWTH 4 DAYS Performed at [facility], [place] [address] [place], [place] [address]    Report Status PENDING  Incomplete  Aerobic Culture (superficial specimen)     Status: None   Collection Time: 01/09/18  2:20 PM  Result Value Ref Range Status   Specimen Description WOUND RIGHT FOOT  Final   Special Requests NONE  Final   Gram Stain   Final    ABUNDANT WBC PRESENT, PREDOMINANTLY PMN NO ORGANISMS SEEN    Culture   Final    NO GROWTH 2 DAYS Performed at [facility], [place] [address], [place], [place] [address]    Report Status 01/11/2018 FINAL  Final    Studies/Results: [place] Ekg Site Rite  Result Date: 01/11/2018 If Site Rite image not attached, placement could not be confirmed due to current cardiac rhythm.  [place] Ekg Site Rite  Result Date: 01/11/2018 If Site Rite image not attached, placement could not be confirmed due to current cardiac rhythm.  [place] Ekg Site Rite  Result Date: 01/10/2018 If Site Rite image not attached, placement could not be confirmed due to current cardiac rhythm.    Assessment/Plan: Chronic ulcerations of feet R foot cellulitis and abscess Cx (-)  Total days of antibiotics: 4 vanco/cefepime  Will stop vanco Plan for 1 month total of cefepime Will see her back in ID clinic.  Available as needed.    Allergies  Allergen Reactions  . Zolpidem Tartrate Other (See Comments)    hallucinations  . Amoxicillin Rash and Other (See Comments)    Has patient had a  PCN reaction causing immediate rash, facial/tongue/throat swelling, SOB or lightheadedness with hypotension: Yes Has patient had a PCN reaction causing severe rash involving mucus membranes or skin necrosis: No Has patient had a PCN reaction that required hospitalization: No Has patient had a PCN reaction occurring within the last 10 years: Yes If all  of the above answers are "NO", then may proceed with Cephalosporin use.     OPAT Orders Discharge antibiotics: Cefepime 1 g q8h Duration: 24 days End Date: Feb 04, 2018  [facility] Care Per Protocol:  Labs weekly while on IV antibiotics: _x_ CBC with differential __ BMP _x_ CMP __x CRP _x_ ESR __ Vancomycin trough  _x_ Please pull PIC at completion of IV antibiotics __ Please leave PIC in place until doctor has seen patient or been notified  Fax weekly labs to [phone]  Clinic Follow Up Appt: Yanixan Mellinger 4 weeks.          Bobby Rumpf MD, FACP Infectious Diseases (pager) [phone] [email or web address] 01/11/2018, 3:36 PM  LOS: 4 days

## 2018-01-11 NOTE — Progress Notes (Signed)
PROGRESS NOTE    [name]  AVW:[id number] DOB: August 28, 1951 DOA: 01/07/2018 PCP: Jarome Matin, MD   Brief Narrative:  67 year old female who presented with right foot swelling and pain. She does have a significant past medical history for hypertension, dyslipidemia and obstructive sleep apnea. Patient developed right foot second toe edema, erythema and tenderness. On her initial physical examination blood pressure 121/69, heart rate 59, temperature 98.1, respiratory rate 18, oxygen saturation 100%. Moist mucous membranes, lungs clear to auscultation, heart S1-S2 present rhythmic, the abdomen was soft nontender, lower extremities no edema. Right foot second toe with erythema and edema, active bleeding at the site of the rash.Sodium 136, potassium 3.5, chloride 107, bicarb 17, glucose 106, BUN 22, creatinine 1.24, white count 8.6, hemoglobin 10.0, hematocrit 33.2, platelets 366.X-ray with persistent prominent plantar soft tissue swelling deep to the first MTP joint with a few foci of subcutaneous emphysema, consistent with soft tissue infection. No definite radiographic evidence of osteomyelitis.  Patient was admitted to the hospital with right foot cellulitis rule out osteomyelitis at the right foot.       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  Cultures pending.  MRI negative for osteomyelitis however noted a small abscess at the first metatarsal region.  Continue empiric IV vancomycin and IV cefepime.  ID following.  PICC line ordered per ID and placed today 01/11/2018.  Per ID and orthopedics.  2.  Hypertension Patient noted to have systolic blood pressures  from the 150s to the 170s the morning of 01/10/2018.  Blood pressure improved and patient's doses of home antihypertensive medications resumed yesterday 01/10/2018.  Continue current regimen of hydralazine 75 mg 3 times daily, Norvasc 5 mg daily, lisinopril.   3.  Hyperlipidemia Continue statin.  4.  Paroxysmal atrial fibrillation Currently rate controlled on Coreg and dofetilide.  Apixaban for anticoagulation.  5.  Depression/anxiety Continue current regimen of mirtazapine and bupropion.  6.  Hypokalemia We will give a dose of potassium 20 mEq daily to keep potassium greater than 4 as patient on Tikosyn.  7.  Anemia H&H stable.  No bleeding noted.   DVT prophylaxis: Eliquis Code Status: Full Family Communication: updated patient.  No family at bedside. Disposition Plan: Home when improved clinically and per ID and orthopedics..   Consultants:   Wound Care RN, Helmut Muster 01/09/2018  Infectious disease: Dr. Ninetta Lights 01/09/2018    Procedures:   Plain Films of the right foot 01/07/2018  MRI right foot 01/08/2018  Right foot I and D Earney Hamburg, PA 01/09/2018  picc line 01/11/2018  Antimicrobials:   IV cefepime 01/08/2018  IV Levaquin 01/07/2018>>>> 01/08/2018  IV vancomycin 01/07/2018   Subjective: Patient states feels better.  Denies any chest pain.  No shortness of breath.   Objective: Vitals:   01/10/18 0910 01/10/18 1829 01/10/18 2127 01/11/18 0435  BP: 139/79 (!) 101/52 (!) 152/76 136/80  Pulse: 65 68 73 75  Resp: 18 18 18 18   Temp: 98.2 F (36.8 C) 98.2 F (36.8 C) 98.2 F (36.8 C) 98.5 F (36.9 C)  TempSrc: Oral Oral Oral Oral  SpO2: 99% 99% 98% 97%  Weight:   114 kg (251 lb  5.2 oz)     Intake/Output Summary (Last 24 hours) at 01/11/2018 1249 Last data filed at 01/11/2018 0435 Gross per 24 hour  Intake 1880 ml  Output 0 ml  Net 1880 ml   Filed Weights   01/09/18 2052 01/10/18 0248 01/10/18 2127  Weight: 114 kg (251 lb 5.3 oz) 114 kg (251 lb 5.2  oz) 114 kg (251 lb 5.2 oz)    Examination:  General exam: NAD Respiratory system: Lungs clear to auscultation bilaterally.  No wheezes, no crackles, no rhonchi.  Respiratory effort normal. Cardiovascular system: RRR, 3/6 SEM. No JVD, rubs, gallops or clicks. No pedal edema. Gastrointestinal system: Abdomen is soft, nontender, nondistended, positive bowel sounds.  No rebound.  No guarding.   Central nervous system: Alert and oriented. No focal neurological deficits. Extremities: Lower extremity bandaged.  Patient with plantar ulcers at base of first metatarsal on the right, discoloration of the second toe on the right. Skin: No rashes, lesions or ulcers Psychiatry: Judgement and insight appear normal. Mood & affect appropriate.     Data Reviewed: I have personally reviewed following labs and imaging studies  CBC: Recent Labs  Lab 01/07/18 0932 01/08/18 0650 01/09/18 0614 01/10/18 0639 01/11/18 0618  WBC 8.6 4.7 6.7 7.3 6.7  NEUTROABS 6.4  --  4.6 5.4  --   HGB 10.0* 9.2* 9.4* 9.7* 9.7*  HCT 33.2* 30.9* 31.7* 31.8* 33.0*  MCV 83.4 84.9 85.0 84.4 85.3  PLT 366 347 356 344 324   Basic Metabolic Panel: Recent Labs  Lab 01/07/18 0932 01/08/18 0650 01/09/18 0614 01/10/18 0639 01/11/18 0618  NA 136 140 138 135 139  K 3.5 3.4* 4.2 3.7 4.0  CL 107 110 109 106 109  CO2 17* 21* 20* 22 22  GLUCOSE 106* 100* 98 101* 105*  BUN 22* 18 12 10 10   CREATININE 1.24* 1.16* 1.10* 1.00 1.04*  CALCIUM 8.9 8.5* 8.5* 8.6* 8.4*  MG  --   --   --   --  2.0   GFR: Estimated Creatinine Clearance: 67 mL/min (A) (by C-G formula based on SCr of 1.04 mg/dL (H)). Liver Function Tests: Recent Labs  Lab 01/07/18 0932 01/08/18 0650  AST 19 15  ALT 12* 10*  ALKPHOS 117 105  BILITOT 0.5 0.5  PROT 6.9 6.2*  ALBUMIN 2.9* 2.5*   No results for input(s): LIPASE, AMYLASE in the last 168 hours. No results for input(s): AMMONIA in the last 168 hours. Coagulation Profile: Recent Labs  Lab  01/07/18 0932  INR 1.39   Cardiac Enzymes: No results for input(s): CKTOTAL, CKMB, CKMBINDEX, TROPONINI in the last 168 hours. BNP (last 3 results) No results for input(s): PROBNP in the last 8760 hours. HbA1C: No results for input(s): HGBA1C in the last 72 hours. CBG: No results for input(s): GLUCAP in the last 168 hours. Lipid Profile: No results for input(s): CHOL, HDL, LDLCALC, TRIG, CHOLHDL, LDLDIRECT in the last 72 hours. Thyroid Function Tests: No results for input(s): TSH, T4TOTAL, FREET4, T3FREE, THYROIDAB in the last 72 hours. Anemia Panel: No results for input(s): VITAMINB12, FOLATE, FERRITIN, TIBC, IRON, RETICCTPCT in the last 72 hours. Sepsis Labs: Recent Labs  Lab 01/07/18 1018 01/07/18 1703  LATICACIDVEN 1.80 1.62    Recent Results (from the past 240 hour(s))  Aerobic Culture (superficial specimen)     Status: Abnormal   Collection Time: 01/03/18 10:20 AM  Result Value Ref Range Status   Specimen Description   Final    TOE RIGHT 1ST METATARSAL  HEAD Performed at [facility], [address], [place], [place] [address]    Special Requests   Final    NONE Performed at [facility], [address] [place], [place] [address]    Gram Stain   Final    ABUNDANT WBC PRESENT,BOTH PMN AND MONONUCLEAR RARE GRAM POSITIVE COCCI RARE GRAM VARIABLE ROD Performed at [facility], [address], [place], [place] [address]    Culture MULTIPLE ORGANISMS PRESENT, NONE PREDOMINANT (A)  Final   Report Status 01/06/2018 FINAL  Final  Culture, blood (Routine x 2)     Status: None (Preliminary result)   Collection Time: 01/07/18 10:00 AM  Result Value Ref Range Status   Specimen Description BLOOD LEFT ARM  Final   Special Requests   Final    BOTTLES DRAWN AEROBIC AND ANAEROBIC Blood Culture results may not be optimal due to an inadequate volume of blood received in culture bottles   Culture   Final    NO GROWTH 3 DAYS Performed  at [facility], [address], [place], [place] [address]    Report Status PENDING  Incomplete  Culture, blood (Routine x 2)     Status: None (Preliminary result)   Collection Time: 01/07/18 10:04 AM  Result Value Ref Range Status   Specimen Description BLOOD RIGHT HAND  Final   Special Requests   Final    BOTTLES DRAWN AEROBIC AND ANAEROBIC Blood Culture results may not be optimal due to an inadequate volume of blood received in culture bottles   Culture   Final    NO GROWTH 3 DAYS Performed at [facility], [address] [place], [place] [address]    Report Status PENDING  Incomplete  Aerobic Culture (superficial specimen)     Status: None   Collection Time: 01/09/18  2:20 PM  Result Value Ref Range Status   Specimen Description WOUND RIGHT FOOT  Final   Special Requests NONE  Final   Gram Stain   Final    ABUNDANT WBC PRESENT, PREDOMINANTLY PMN NO ORGANISMS SEEN    Culture   Final    NO GROWTH 2 DAYS Performed at [facility], [address], [place], [place] [address]    Report Status 01/11/2018 FINAL  Final         Radiology Studies: [place] Ekg Site Rite  Result Date: 01/11/2018 If Site Rite image not attached, placement could not be confirmed due to current cardiac rhythm.  [place] Ekg Site Rite  Result Date: 01/11/2018 If Site Rite image not attached, placement could not be confirmed due to current cardiac rhythm.  [place] Ekg Site Rite  Result Date: 01/10/2018 If Site Rite image not attached, placement could not be confirmed due to current cardiac rhythm.       Scheduled Meds: . amLODipine  5 mg Oral Daily  . apixaban  5 mg Oral BID  . buPROPion  150 mg Oral Daily  . carvedilol  6.25 mg Oral BID  . dofetilide  250 mcg Oral BID  . gabapentin  600 mg Oral QID  . hydrALAZINE  75 mg Oral TID  . iron polysaccharides  150 mg Oral Daily  . lidocaine-EPINEPHrine  10 mL Intradermal Once  . lisinopril  40 mg Oral Daily  . magnesium oxide  400 mg  Oral Daily  . pravastatin  40 mg Oral q1800  . vortioxetine HBr  5 mg Oral QHS  Continuous Infusions: . ceFEPime (MAXIPIME) IV 1 g (01/11/18 0932)  . vancomycin 1,500 mg (01/10/18 1852)     LOS: 4 days    Time spent: 35 minutes    Ramiro Harvest, MD [facility] Pager [phone]  If 7PM-7AM, please contact night-coverage [email or web address] Password [facility] 01/11/2018, 12:49 PM

## 2018-01-12 DIAGNOSIS — I739 Peripheral vascular disease, unspecified: Secondary | ICD-10-CM | POA: Diagnosis not present

## 2018-01-12 DIAGNOSIS — Z7901 Long term (current) use of anticoagulants: Secondary | ICD-10-CM | POA: Diagnosis not present

## 2018-01-12 DIAGNOSIS — G603 Idiopathic progressive neuropathy: Secondary | ICD-10-CM | POA: Diagnosis not present

## 2018-01-12 DIAGNOSIS — Z87891 Personal history of nicotine dependence: Secondary | ICD-10-CM | POA: Diagnosis not present

## 2018-01-12 DIAGNOSIS — I7025 Atherosclerosis of native arteries of other extremities with ulceration: Secondary | ICD-10-CM

## 2018-01-12 DIAGNOSIS — L97512 Non-pressure chronic ulcer of other part of right foot with fat layer exposed: Secondary | ICD-10-CM | POA: Diagnosis not present

## 2018-01-12 LAB — CBC
HEMATOCRIT: 30.4 % — AB (ref 36.0–46.0)
Hemoglobin: 8.8 g/dL — ABNORMAL LOW (ref 12.0–15.0)
MCH: 24.8 pg — AB (ref 26.0–34.0)
MCHC: 28.9 g/dL — AB (ref 30.0–36.0)
MCV: 85.6 fL (ref 78.0–100.0)
Platelets: 319 10*3/uL (ref 150–400)
RBC: 3.55 MIL/uL — ABNORMAL LOW (ref 3.87–5.11)
RDW: 18.5 % — AB (ref 11.5–15.5)
WBC: 6.3 10*3/uL (ref 4.0–10.5)

## 2018-01-12 LAB — BASIC METABOLIC PANEL
Anion gap: 7 (ref 5–15)
BUN: 12 mg/dL (ref 6–20)
CALCIUM: 8.6 mg/dL — AB (ref 8.9–10.3)
CO2: 24 mmol/L (ref 22–32)
CREATININE: 1.15 mg/dL — AB (ref 0.44–1.00)
Chloride: 108 mmol/L (ref 101–111)
GFR calc non Af Amer: 48 mL/min — ABNORMAL LOW (ref >60)
GFR, EST AFRICAN AMERICAN: 56 mL/min — AB (ref >60)
GLUCOSE: 96 mg/dL (ref 65–99)
Potassium: 3.9 mmol/L (ref 3.5–5.1)
Sodium: 139 mmol/L (ref 135–145)

## 2018-01-12 LAB — CULTURE, BLOOD (ROUTINE X 2)
CULTURE: NO GROWTH
CULTURE: NO GROWTH

## 2018-01-12 MED ORDER — HEPARIN SOD (PORK) LOCK FLUSH 100 UNIT/ML IV SOLN
250.0000 [IU] | INTRAVENOUS | Status: AC | PRN
  Administered 2018-01-12: 250 [IU]

## 2018-01-12 MED ORDER — CEFEPIME IV (FOR PTA / DISCHARGE USE ONLY): 1.0000 g | Freq: Three times a day (TID) | INTRAVENOUS | 0 refills | Status: AC

## 2018-01-12 NOTE — Progress Notes (Signed)
Text notification sent to attending Dr. Janee Mornhompson per [facility] "pharmacist entered prescription for IV antibiotic for discharge pending MD signature".

## 2018-01-12 NOTE — Evaluation (Signed)
Occupational Therapy Evaluation and Discharge Patient Details Name: Yvette ShutterCarolyn I Orozco MRN: [id number] DOB: 1951/03/25 Today's Date: 01/12/2018    History of Present Illness  [name]  is a 67 y.o. female,   w hypertension, hyperlipidemia, OSA; pt adm 01/07/18 with  Osteomyelitis of the left foot,  with c/o bleeding of the right 2nd toe and swelling of the right foot.     Clinical Impression   Pt demonstrated ability to perform ADL and toilet transfers modified independently, pushing IV pole. Pt typically uses a cane. No OT needs.     Follow Up Recommendations  No OT follow up    Equipment Recommendations  None recommended by OT    Recommendations for Other Services       Precautions / Restrictions Precautions Precautions: None Restrictions Weight Bearing Restrictions: No      Mobility Bed Mobility Overal bed mobility: Modified Independent             General bed mobility comments: HOB up  Transfers Overall transfer level: Modified independent Equipment used: (pushed IV pole)             General transfer comment: cues for safety, pt moving quickly without awareness of IV line around phone cord    Balance     Sitting balance-Leahy Scale: Good       Standing balance-Leahy Scale: Fair                             ADL either performed or assessed with clinical judgement   ADL Overall ADL's : Modified independent                                       General ADL Comments: pt moving rather quickly due to bowel urgency     Vision Patient Visual Report: No change from baseline       Perception     Praxis      Pertinent Vitals/Pain Pain Assessment: Faces Faces Pain Scale: Hurts little more Pain Location: R foot Pain Descriptors / Indicators: Aching Pain Intervention(s): Repositioned;Monitored during session     Hand Dominance Right   Extremity/Trunk Assessment Upper Extremity Assessment Upper Extremity  Assessment: Overall WFL for tasks assessed   Lower Extremity Assessment Lower Extremity Assessment: Defer to PT evaluation   Cervical / Trunk Assessment Cervical / Trunk Assessment: Kyphotic   Communication Communication Communication: No difficulties   Cognition Arousal/Alertness: Awake/alert Behavior During Therapy: WFL for tasks assessed/performed Overall Cognitive Status: Within Functional Limits for tasks assessed                                     General Comments       Exercises     Shoulder Instructions      Home Living Family/patient expects to be discharged to:: Private residence Living Arrangements: Alone Available Help at Discharge: Friend(s);Available PRN/intermittently Type of Home: House Home Access: Ramped entrance     Home Layout: One level     Bathroom Shower/Tub: Producer, television/film/videoWalk-in shower   Bathroom Toilet: Handicapped height     Home Equipment: Bedside commode;Shower seat;Grab bars - tub/shower;Walker - 2 wheels;Walker - 4 wheels   Additional Comments: doesn't use BSC, was doing HHPT/OT prior to adm      Prior Functioning/Environment Level  of Independence: Independent with assistive device(s)        Comments: amb with cane        OT Problem List:        OT Treatment/Interventions:      OT Goals(Current goals can be found in the care plan section) Acute Rehab OT Goals Patient Stated Goal: foot healed, get back home  OT Frequency:     Barriers to D/C:            Co-evaluation              AM-PAC PT "6 Clicks" Daily Activity     Outcome Measure Help from another person eating meals?: None Help from another person taking care of personal grooming?: None Help from another person toileting, which includes using toliet, bedpan, or urinal?: None Help from another person bathing (including washing, rinsing, drying)?: None Help from another person to put on and taking off regular upper body clothing?: None Help from  another person to put on and taking off regular lower body clothing?: None 6 Click Score: 24   End of Session    Activity Tolerance: Patient tolerated treatment well Patient left: in bed;with call bell/phone within reach  OT Visit Diagnosis: Pain                Time: 1415-1436 OT Time Calculation (min): 21 min Charges:  OT General Charges $OT Visit: 1 Visit OT Evaluation $OT Eval Low Complexity: 1 Low G-Codes:     Evern Bio 01/12/2018, 2:41 PM\ 01/12/2018 Martie Round, OTR/L Pager: [phone]

## 2018-01-12 NOTE — Progress Notes (Signed)
Patient discharged to home, AVS reviewed, wound care education completed, IV removed and tele box returned, called [facility] to confirm antibiotic dose and delivery, got cab voucher for patient, she left via wheelchair with staff member

## 2018-01-12 NOTE — Discharge Summary (Signed)
Physician Discharge Summary  [name] SWH:[id number] DOB: 1951-03-17 DOA: 01/07/2018  PCP: [name], MD  Admit date: 01/07/2018 Discharge date: 01/12/2018  Time spent: 60 minutes  Recommendations for Outpatient Follow-up:  1. Follow-up with [name], orthopedics in 2 weeks. 2. Follow-up with [name], ID in 4 weeks. 3. Follow-up with [name], MD in 3 weeks.  On follow-up patient will need a basic metabolic profile done to follow-up on electrolytes and renal function.   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 female,   w hypertension, hyperlipidemia, OSA, RLS w recent  ? Osteomyelitis of the left foot presented with c/o bleeding of the right 2nd toe and swelling of the right foot.    In ED,  Xray right foot IMPRESSION: 1. Persistent prominent plantar soft tissue swelling deep to the first MTP joint with a few foci of subcutaneous emphysema, consistent with soft tissue infection. No definite radiographic evidence of osteomyelitis. 2. Unchanged cystic appearance at the medial aspect of the first MTP joint, favored to reflect sequelae of gout. 3. Unchanged midfoot neuropathic arthropathy.   Na 136, K 3.5 Bun 22, Creatinine 1.24 Ast 19, Alt 12  Wbc 8.6, Hgb 10.0 Plt 366  INR 1.39  Pt admitted for infection of the right foot, toe.      Hospital Course:  1 right foot ulcer/cellulitis rule out osteomyelitis Patient  status post I and D of right foot abscess by orthopedics 01/09/2018.  Cultures were done with no organisms seen. MRI negative for osteomyelitis however noted a small abscess at the first metatarsal region.  This was maintained empirically on IV vancomycin and IV cefepime throughout the hospitalization.  ID was consulted and followed the patient.  PICC line was subsequently placed and IV vancomycin discontinued.  ID recommended 1 month of IV cefepime with outpatient follow-up in the ID clinic.  Orthopedics  recommended home health RN for dressing changes and patient to be weightbearing as tolerated on bilateral lower extremities.  Patient will follow-up in the orthopedic office, with [name] 2 weeks post discharge.  Patient remained afebrile.  Patient improved clinically and patient be discharged in stable and improved condition.    2.  Hypertension Patient noted to have systolic blood pressures from the 150s to the 170s the morning of 01/10/2018.  Blood pressure improved and patient's doses of home antihypertensive medications resumed on 01/10/2018.  Blood pressure remained stable.  Outpatient follow-up.   3.  Hyperlipidemia Continued on a statin.  4.  Paroxysmal atrial fibrillation Remained rate controlled on Coreg and dofetilide.  Apixaban for anticoagulation.  5.  Depression/anxiety Continued on home regimen of mirtazapine and bupropion.  6.  Hypokalemia Hypokalemic during the hospitalization.  Potassium was repleted and potassium was kept at 4 or greater as patient was on Tikosyn.  Outpatient follow-up with PCP.   7.  Anemia H&H stable.  No bleeding noted.     Procedures:  Plain Films of the right foot 01/07/2018  MRI right foot 01/08/2018  Right foot I and D Hilbert Odor, PA 01/09/2018  picc line 01/11/2018      Consultations:  Wound Care RN, Val Riles 01/09/2018  Infectious disease: [name] 01/09/2018  Orthopedics: Dr Doran Durand 01/10/2016      Discharge  Exam: Vitals:   01/12/18 0820 01/12/18 0821  BP: (!) 152/73   Pulse: 63 (!) 58  Resp: 18   Temp:  98.3 F (36.8 C)  SpO2: 97% 99%    General: NAD Cardiovascular: RRR Respiratory: CTAB  Discharge Instructions   Discharge Instructions    AMB Referral to [facility]   Complete by:  As directed    Please assign HTA member to [facility] Pharmacist for potential medication gaps for Tikosyn and Eliquis and medication review. Currently at [facility] Written consent obtained. Please call with questions. Thanks. Marthenia Rolling, MSN-Ed, [facility] [phone]   Reason for consult:  Please assign to [facility] Pharmacist   Diagnoses of:  Other   Other Diagnosis:  HTN   Expected date of contact:  1-3 days (reserved for hospital discharges)   Diet - low sodium heart healthy   Complete by:  As directed    Home infusion instructions [facility] May follow [place] Dosing Protocol; May administer Cathflo as needed to maintain patency of vascular access device.; Flushing of vascular access device: per [facility] Protocol: 0.9% NaCl pre/post medica...   Complete by:  As directed    Instructions:  May follow [place] Dosing Protocol   Instructions:  May administer Cathflo as needed to maintain patency of vascular access device.   Instructions:  Flushing of vascular access device: per [facility] Protocol: 0.9% NaCl pre/post medication administration and prn patency; Heparin 100 u/ml, 1m for implanted ports and Heparin 10u/ml, 57mfor all other central venous catheters.   Instructions:  May follow [facility] Anaphylaxis Protocol for First Dose Administration in the home: 0.9% NaCl at 25-50 ml/hr to maintain IV access for protocol meds. Epinephrine 0.3 ml IV/IM PRN and Benadryl 25-50 IV/IM PRN s/s of anaphylaxis.   Instructions:  [facility] Coordinator (RN) to assist per patient IV care needs in the home PRN.   Increase activity slowly   Complete by:  As directed       Allergies as of 01/12/2018      Reactions   Zolpidem Tartrate Other (See Comments)   hallucinations   Amoxicillin Rash, Other (See Comments)   Has patient had a PCN reaction causing immediate rash, facial/tongue/throat swelling, SOB or lightheadedness with hypotension: Yes Has patient had a PCN reaction causing severe rash involving mucus membranes or skin necrosis: No Has patient had a PCN reaction that required hospitalization: No Has patient had a PCN reaction occurring within the last 10 years: Yes If all of the above answers are "NO", then may proceed with Cephalosporin use.      Medication List    STOP taking these medications   cefUROXime 500 MG tablet Commonly known as:  CEFTIN   doxycycline 100 MG capsule Commonly known as:  VIBRAMYCIN     TAKE these medications   acetaminophen 325 MG tablet Commonly known as:  TYLENOL Take 325-650 mg by mouth every 6 (six) hours as needed for mild pain or headache.   amLODipine 5 MG tablet Commonly known as:  NORVASC Take 1 tablet (5 mg total) by mouth daily.   buPROPion 150 MG 24 hr  tablet Commonly known as:  WELLBUTRIN XL Take 150 mg by mouth daily.   carvedilol 6.25 MG tablet Commonly known as:  COREG Take 1 tablet (6.25 mg total) by mouth 2 (two) times daily. What changed:  Another medication with the same name was removed. Continue taking this medication, and follow the directions you see here.   ceFEPime IVPB Commonly known as:  MAXIPIME Inject 1 g into the vein every 8 (eight) hours for 24 days. Indication:  Right foot cellulitis and abscess Last Day of Therapy:  02/04/18 Labs - Once weekly:  CBC/D and CMP, ESR, CRP   cyanocobalamin 1000 MCG/ML injection Commonly known as:  (VITAMIN B-12) Inject 1,000 mcg into the muscle every 30 (thirty) days.   dofetilide 250 MCG capsule Commonly known as:  TIKOSYN TAKE 1 CAPSULE(250 MCG) BY MOUTH TWICE DAILY   ELIQUIS 5 MG Tabs tablet Generic drug:  apixaban Take 5 mg by  mouth 2 (two) times daily.   fluticasone 50 MCG/ACT nasal spray Commonly known as:  FLONASE Place 1 spray into both nostrils daily as needed for allergies.   gabapentin 600 MG tablet Commonly known as:  NEURONTIN Take 600 mg by mouth 4 (four) times daily.   hydrALAZINE 50 MG tablet Commonly known as:  APRESOLINE Take 1.5 tablets (75 mg total) by mouth 3 (three) times daily.   iron polysaccharides 150 MG capsule Commonly known as:  NIFEREX Take 150 mg by mouth daily.   lisinopril 40 MG tablet Commonly known as:  PRINIVIL,ZESTRIL Take 40 mg by mouth daily.   Magnesium Oxide 400 MG Caps Take 1 capsule (400 mg total) by mouth daily.   mirtazapine 15 MG tablet Commonly known as:  REMERON Take 15 mg by mouth daily as needed (sleep).   MULTIVITAMIN & MINERAL PO Take 1 tablet by mouth daily.   pravastatin 40 MG tablet Commonly known as:  PRAVACHOL Take 40 mg by mouth daily.   TRINTELLIX 5 MG Tabs tablet Generic drug:  vortioxetine HBr Take 5 mg by mouth daily.            Home Infusion Instuctions  (From admission, onward)        Start     Ordered   01/12/18 0000  Home infusion instructions [facility] May follow [place] Dosing Protocol; May administer Cathflo as needed to maintain patency of vascular access device.; Flushing of vascular access device: per [facility] Protocol: 0.9% NaCl pre/post medica...    Question Answer Comment  Instructions May follow [place] Dosing Protocol   Instructions May administer Cathflo as needed to maintain patency of vascular access device.   Instructions Flushing of vascular access device: per [facility] Protocol: 0.9% NaCl pre/post medication administration and prn patency; Heparin 100 u/ml, 61m for implanted ports and Heparin 10u/ml, 534mfor all other central venous catheters.   Instructions May follow [facility] Anaphylaxis Protocol for First Dose Administration in the home: 0.9% NaCl at 25-50 ml/hr to maintain IV access for protocol  meds. Epinephrine 0.3 ml IV/IM PRN and Benadryl 25-50 IV/IM PRN s/s of anaphylaxis.   Instructions [facility] Infusion Coordinator (RN) to assist per patient IV care needs in the home PRN.      01/12/18 1532     Allergies  Allergen Reactions  . Zolpidem Tartrate Other (See Comments)    hallucinations  . Amoxicillin Rash and Other (See Comments)    Has patient had a PCN reaction causing immediate rash, facial/tongue/throat swelling, SOB or lightheadedness with hypotension:  Yes Has patient had a PCN reaction causing severe rash involving mucus membranes or skin necrosis: No Has patient had a PCN reaction that required hospitalization: No Has patient had a PCN reaction occurring within the last 10 years: Yes If all of the above answers are "NO", then may proceed with Cephalosporin use.    Follow-up Information    Wylene Simmer, MD Follow up in 2 week(s).   Specialty:  Orthopedic Surgery Contact information: [address] [place] [address] [phone]        Campbell Riches, MD. Schedule an appointment as soon as possible for a visit in 4 week(s).   Specialty:  Infectious Diseases Contact information: [place] [address] [phone]        [name], MD. Schedule an appointment as soon as possible for a visit in 3 week(s).   Specialty:  Internal Medicine Contact information: [place] [address] [phone]            The results of significant diagnostics from this hospitalization (including imaging, microbiology, ancillary and laboratory) are listed below for reference.    Significant Diagnostic Studies: Mr Foot Right W Wo Contrast  Result Date: 01/08/2018 CLINICAL DATA:  Skin wound on the plantar surface of the foot at the first MTP joint for approximately 1 week. EXAM: MRI OF THE RIGHT FOREFOOT WITHOUT AND WITH CONTRAST TECHNIQUE: Multiplanar, multisequence MR imaging of the  right forefoot was performed before and after the administration of intravenous contrast. CONTRAST:  20 ml MULTIHANCE GADOBENATE DIMEGLUMINE 529 MG/ML IV SOLN COMPARISON:  Plain films right foot 01/07/2018. FINDINGS: Bones/Joint/Cartilage There is no bone marrow signal abnormality or enhancement to suggest osteomyelitis. The patient has advanced first MTP osteoarthritis with joint space narrowing and osteophytosis. Advanced midfoot osteoarthritis is also seen. There is some degenerative change about the fifth MTP joint. Ligaments Intact. Muscles and Tendons Intact. Soft tissues Skin ulceration is seen on the plantar surface of the foot deep to the first MTP joint. No underlying abscess is identified. Marked soft tissue swelling and edema are present in the first intermetatarsal space. Superficial to the second MTP joint, there is a subcutaneous abscess measuring approximately 0.9 cm transverse by 0.4 cm craniocaudal by 1.3 cm long. A focus of susceptibility artifact is seen along the superficial margin of the proximal phalanx of the second toe. A second focus of susceptibility artifact is seen in the subcutaneous tissues just medial to the skin ulceration on the plantar surface of the foot. Small foci of soft tissue gas on plain films are difficult to visualize on this exam. IMPRESSION: Skin ulceration on the plantar surface of the foot at the first MTP joint without underlying abscess or osteomyelitis. Cellulitis appearing most intense in the first intermetatarsal space. There is a small subcutaneous abscess in the dorsal soft tissues over the second MTP joint as described above. Foci of susceptibility artifact medial to the patient's plantar skin ulceration and over the base of the proximal phalanx of the second toe have an appearance most suggestive of radiopaque foreign bodies but could be secondary to soft tissue gas. Radiopaque foreign bodies could be microscopic. No foreign bodies are seen on prior plain  films. Advanced first MTP and midfoot osteoarthritis. Electronically Signed   By: Inge Rise M.D.   On: 01/08/2018 11:16   Dg Foot Complete Right  Result Date: 01/07/2018 CLINICAL DATA:  Right foot infection for the past month. Evaluate for osteomyelitis. EXAM:  RIGHT FOOT COMPLETE - 3+ VIEW COMPARISON:  Right foot x-rays dated December 19, 2017. FINDINGS: Persistent prominent plantar soft tissue swelling deep to the first MTP joint with a few foci of subcutaneous emphysema. No new cortical destruction or osteolysis. Cystic change along the medial aspect of the first metatarsal head and first proximal phalanx base is unchanged and favored to reflect sequelae of gout. Stable erosive changes about the fifth MTP joint. Unchanged midfoot neuropathic arthropathy. No acute fracture or dislocation. Diffuse osteopenia. IMPRESSION: 1. Persistent prominent plantar soft tissue swelling deep to the first MTP joint with a few foci of subcutaneous emphysema, consistent with soft tissue infection. No definite radiographic evidence of osteomyelitis. 2. Unchanged cystic appearance at the medial aspect of the first MTP joint, favored to reflect sequelae of gout. 3. Unchanged midfoot neuropathic arthropathy. Electronically Signed   By: Titus Dubin M.D.   On: 01/07/2018 13:23   Dg Foot Complete Right  Result Date: 12/20/2017 CLINICAL DATA:  Diabetic patient with a skin ulceration at the first metatarsal. EXAM: RIGHT FOOT COMPLETE - 3+ VIEW COMPARISON:  CT right foot 06/07/2017. FINDINGS: Soft tissues of the foot are swollen, particularly about the first MTP joint. Cystic change in the first metatarsal head is identified. There is a new area of bone loss in the base of the proximal phalanx of the great toe on the medial side. Erosive change is also seen about the fifth MTP joint. Marked joint space narrowing, erosions and disorganized appearance of the midfoot is identified and were present on the prior CT. IMPRESSION:  Diffuse soft tissue swelling appears worst about the first MTP joint. New focus of bone loss in the base of the proximal phalanx of the great toe could be related to gout or could be secondary to osteomyelitis. No marked change in the appearance of the fifth MTP joint likely secondary to gout. Extensive erosive change disorganized appearance of the midfoot is consistent with Charcot change and gouty arthropathy. Electronically Signed   By: Inge Rise M.D.   On: 12/20/2017 09:46   [place] Ekg Site Rite  Result Date: 01/11/2018 If Site Rite image not attached, placement could not be confirmed due to current cardiac rhythm.  [place] Ekg Site Rite  Result Date: 01/11/2018 If Site Rite image not attached, placement could not be confirmed due to current cardiac rhythm.  [place] Ekg Site Rite  Result Date: 01/10/2018 If Site Rite image not attached, placement could not be confirmed due to current cardiac rhythm.   Microbiology: Recent Results (from the past 240 hour(s))  Aerobic Culture (superficial specimen)     Status: Abnormal   Collection Time: 01/03/18 10:20 AM  Result Value Ref Range Status   Specimen Description   Final    TOE RIGHT 1ST METATARSAL HEAD Performed at [facility] [address], [place], [place] [address]    Special Requests   Final    NONE Performed at [facility], [place] [address] [place],  [address]    Gram Stain   Final    ABUNDANT WBC PRESENT,BOTH PMN AND MONONUCLEAR RARE GRAM POSITIVE COCCI RARE GRAM VARIABLE ROD Performed at [facility], [place] [address], [place],  [address]    Culture MULTIPLE ORGANISMS PRESENT, NONE PREDOMINANT (A)  Final   Report Status 01/06/2018 FINAL  Final  Culture, blood (Routine x 2)     Status: None   Collection Time: 01/07/18 10:00 AM  Result Value Ref Range Status  Specimen Description BLOOD LEFT ARM  Final   Special Requests   Final    BOTTLES DRAWN AEROBIC AND ANAEROBIC  Blood Culture results may not be optimal due to an inadequate volume of blood received in culture bottles   Culture   Final    NO GROWTH 5 DAYS Performed at [facility], [place] [address], [place], [place] [address]    Report Status 01/12/2018 FINAL  Final  Culture, blood (Routine x 2)     Status: None   Collection Time: 01/07/18 10:04 AM  Result Value Ref Range Status   Specimen Description BLOOD RIGHT HAND  Final   Special Requests   Final    BOTTLES DRAWN AEROBIC AND ANAEROBIC Blood Culture results may not be optimal due to an inadequate volume of blood received in culture bottles   Culture   Final    NO GROWTH 5 DAYS Performed at [facility], [place] [place] [address], [place], [place] [address]    Report Status 01/12/2018 FINAL  Final  Aerobic Culture (superficial specimen)     Status: None   Collection Time: 01/09/18  2:20 PM  Result Value Ref Range Status   Specimen Description WOUND RIGHT FOOT  Final   Special Requests NONE  Final   Gram Stain   Final    ABUNDANT WBC PRESENT, PREDOMINANTLY PMN NO ORGANISMS SEEN    Culture   Final    NO GROWTH 2 DAYS Performed at [facility], [place] [address], [place], [place] [address]    Report Status 01/11/2018 FINAL  Final     Labs: Basic Metabolic Panel: Recent Labs  Lab 01/08/18 0650 01/09/18 0614 01/10/18 0639 01/11/18 0618 01/12/18 0445  NA 140 138 135 139 139  K 3.4* 4.2 3.7 4.0 3.9  CL 110 109 106 109 108  CO2 21* 20* _0 GLUCOSE 100* 98 101* 105* 96  BUN _1 CREATININE 1.16* 1.10* 1.00 1.04* 1.15*  CALCIUM 8.5* 8.5* 8.6* 8.4* 8.6*  MG  --   --   --  2.0  --    Liver Function Tests: Recent Labs  Lab 01/07/18 0932 01/08/18 0650  AST 19 15  ALT 12* 10*  ALKPHOS 117 105  BILITOT 0.5 0.5  PROT 6.9 6.2*  ALBUMIN 2.9* 2.5*   No results for input(s): LIPASE, AMYLASE in the last 168 hours. No results for input(s): AMMONIA in the last 168 hours. CBC: Recent Labs  Lab 01/07/18 0932  01/08/18 0650 01/09/18 0614 01/10/18 0639 01/11/18 0618 01/12/18 0445  WBC 8.6 4.7 6.7 7.3 6.7 6.3  NEUTROABS 6.4  --  4.6 5.4  --   --   HGB 10.0* 9.2* 9.4* 9.7* 9.7* 8.8*  HCT 33.2* 30.9* 31.7* 31.8* 33.0* 30.4*  MCV 83.4 84.9 85.0 84.4 85.3 85.6  PLT 366 347 356 344 324 319   Cardiac Enzymes: No results for input(s): CKTOTAL, CKMB, CKMBINDEX, TROPONINI in the last 168 hours. BNP: BNP (last 3 results) No results for input(s): BNP in the last 8760 hours.  ProBNP (last 3 results) No results for input(s): PROBNP in the last 8760 hours.  CBG: No results for input(s): GLUCAP in the last 168 hours.     Signed:  Irine Seal MD.  [facility] 01/12/2018, 4:19 PM

## 2018-01-12 NOTE — Progress Notes (Signed)
2nd shift [facility] ED CSW covering for [facility] tonight received a request for a taxi voucher from pt's RN. [facility] CSW Stated to RN this Clinical research associatewriter [facility]  Please reconsult if future social work needs arise.  CSW signing off, as social work intervention is no longer needed.  Dorothe PeaJonathan F. Kenyen Candy, LCSW, LCAS, CSI Clinical Social Worker Ph: [phone]      '

## 2018-01-15 ENCOUNTER — Other Ambulatory Visit: Payer: Self-pay | Admitting: Pharmacist

## 2018-01-15 DIAGNOSIS — L97512 Non-pressure chronic ulcer of other part of right foot with fat layer exposed: Secondary | ICD-10-CM | POA: Diagnosis not present

## 2018-01-15 DIAGNOSIS — L97522 Non-pressure chronic ulcer of other part of left foot with fat layer exposed: Secondary | ICD-10-CM | POA: Diagnosis not present

## 2018-01-15 DIAGNOSIS — G603 Idiopathic progressive neuropathy: Secondary | ICD-10-CM | POA: Diagnosis not present

## 2018-01-15 NOTE — Patient Outreach (Signed)
[facility] Care Management  01/15/2018  Enriqueta ShutterCarolyn I Burkland Oct 23, 1950 [id number]  67 y.o. year old [facility] (post discharge medication review) and Medication Assistance (Tikosyn, Eliquis) Referred by Gerre ScullAtika, [facility] Liaison.    Scheduled medication review with patient for tomorrow.   Sherlyn HayJulie Saunders, PharmD Clinical Pharmacist, [facility] [phone]

## 2018-01-16 ENCOUNTER — Other Ambulatory Visit: Payer: Self-pay | Admitting: Pharmacist

## 2018-01-16 ENCOUNTER — Telehealth: Payer: Self-pay | Admitting: Nurse Practitioner

## 2018-01-16 NOTE — Patient Outreach (Signed)
[facility]) Care Management  Young   01/16/2018  [name] 07-28-1951 [id number]  67 y.o. year old female referred to [place] for Medication Management (Initial pharmacy outreach) and Medication Assistance (Tikosyn, Eliquis)  Referred by Marthenia Rolling, [facility] Liaison.  PMH s/f: hypertension, atrial fibrillation, OSA, neuropathy, osteomyelitis, palpitations, atherosclerotic PVD, depression, obesity  Patient with [employer] Medicare Advantage plan.   Patient confirms identity with HIPAA-identifiers x2 and gives verbal consent to speak over the phone about medications.   Subjective: Medication Adherence: Patient reports 100% adherence with medication. She keeps her medications in a container that she keeps with her at all times. Her self reported medication schedule matches the medication list in her chart.  Medication Assistance:  Medication assistance for Tikosyn and Eliquis are being handled by Dr. Tanna Furry office. Applications for both programs were faxed on 01/11/18. Patient states she currently gets samples.   Medication Management:  Patient manages medications herself. Is a good historian and knowledgeable about her medications.   Objective:   Current Medications: Current Outpatient Medications  Medication Sig Dispense Refill  . acetaminophen (TYLENOL) 325 MG tablet Take 325-650 mg by mouth every 6 (six) hours as needed for mild pain or headache.     Marland Kitchen amLODipine (NORVASC) 5 MG tablet Take 1 tablet (5 mg total) by mouth daily. 30 tablet 6  . buPROPion (WELLBUTRIN XL) 150 MG 24 hr tablet Take 150 mg by mouth daily.     . carvedilol (COREG) 6.25 MG tablet Take 1 tablet (6.25 mg total) by mouth 2 (two) times daily. 180 tablet 3  . ceFEPime (MAXIPIME) IVPB Inject 1 g into the vein every 8 (eight) hours for 24 days. Indication:  Right foot cellulitis and abscess Last Day of Therapy:  02/04/18 Labs - Once weekly:  CBC/D and CMP,  ESR, CRP 72 Units 0  . cyanocobalamin (,VITAMIN B-12,) 1000 MCG/ML injection Inject 1,000 mcg into the muscle every 30 (thirty) days.    Marland Kitchen dofetilide (TIKOSYN) 250 MCG capsule TAKE 1 CAPSULE(250 MCG) BY MOUTH TWICE DAILY 60 capsule 10  . ELIQUIS 5 MG TABS tablet Take 5 mg by mouth 2 (two) times daily.    Marland Kitchen gabapentin (NEURONTIN) 600 MG tablet Take 600 mg by mouth 4 (four) times daily.    . hydrALAZINE (APRESOLINE) 50 MG tablet Take 1.5 tablets (75 mg total) by mouth 3 (three) times daily. 135 tablet 3  . iron polysaccharides (NIFEREX) 150 MG capsule Take 150 mg by mouth daily.    Marland Kitchen lisinopril (PRINIVIL,ZESTRIL) 40 MG tablet Take 40 mg by mouth daily.    . Magnesium Oxide 400 MG CAPS Take 1 capsule (400 mg total) by mouth daily. 90 capsule 3  . mirtazapine (REMERON) 15 MG tablet Take 15 mg by mouth daily as needed (sleep).     . Multiple Vitamins-Minerals (MULTIVITAMIN & MINERAL PO) Take 1 tablet by mouth daily.    . pravastatin (PRAVACHOL) 40 MG tablet Take 40 mg by mouth daily.     Marland Kitchen vortioxetine HBr (TRINTELLIX) 5 MG TABS tablet Take 5 mg by mouth daily.    . fluticasone (FLONASE) 50 MCG/ACT nasal spray Place 1 spray into both nostrils daily as needed for allergies.      No current facility-administered medications for this visit.     Functional Status: In your present state of health, do you have any difficulty performing the following activities: 01/11/2018 10/11/2017  Hearing? N -  Vision? N -  Difficulty  concentrating or making decisions? N -  Walking or climbing stairs? N -  Dressing or bathing? N -  Doing errands, shopping? N Y  Some recent data might be hidden    Fall/Depression Screening: No flowsheet data found. PHQ 2/9 Scores 04/21/2015 02/24/2015  PHQ - 2 Score 0 0   Assessment:  Date Discharged from Hospital: 01/12/18 Date Medication Reconciliation Performed: 01/16/2018  Medications Discontinued at Discharge:  cefuroxime  doxycycline  New Medications at  Discharge:  cefepime  Patient was recently discharged from hospital and all medications have been reviewed  Drugs sorted by system:  Neurologic/Psychologic: buproprion, gabapentin, mirtazapine, vortioxetine  Cardiovascular: amlodipine, carvedilol, dofetilide, apixaban, hydralazine, lisinopril, pravastatin  Pain: acetaminophen  Vitamins/Minerals: cyanocobalamin, iron, magnesium oxide, MVI  Infectious Diseases: cefepime  Medications to avoid in the elderly: none of clinical significance Drug interactions: none of clinical significance  Other issues noted: Cefepime and PCN allergy (reaction: rash)   Plan: 1. Instructed patient to take new medications as prescribed and discontinue old medications as prescribed.  2. Dr. Tanna Furry office is handling medication assistance for Tikosyn and Eliquis.  3. No other clinical pharmacy needs identified at this time. Will close pharmacy episode.   Charlett Lango, PharmD Clinical Pharmacist, [facility] [phone]

## 2018-01-16 NOTE — Telephone Encounter (Signed)
New message    [facility] calling to request medication on patient behalf.  Pt c/o medication issue:  1. Name of Medication: dofetilide (TIKOSYN) 250 MCG capsule  2. How are you currently taking this medication (dosage and times per day)? As written  3. Are you having a reaction (difficulty breathing--STAT)? No  4. What is your medication issue? [facility], patient mentioned she may not have enough medication. Does this medication have samples?

## 2018-01-16 NOTE — Telephone Encounter (Signed)
S/w pt stated someone called pt to state Tikosyn was in to pick up.  Pt already has taken care of this.

## 2018-01-16 NOTE — Telephone Encounter (Signed)
The pts Tikosyn arrived in the office this am from [employer] pt assistance. I called her and let her know her Tikosyn is here at the office and that she can pick up at her convenience in the front office.  While on the phone she requested samples of Eliquis as we have not heard back from [employer] concerning her pt assistance application.  She is aware that I have placed her Tikosyn and 2 boxes of Eliquis samples in the front office and she states that she will be here tomorrow for blood work and will pick them up at that time.  I do not have the pts ID/order# to re-order her Tikosyn as [employer] never sent us an Artistapproval letter. The ID/order number is not needed to re-order as long as we have the pts general information. [employer]: [phone]

## 2018-01-17 ENCOUNTER — Other Ambulatory Visit: Payer: Self-pay | Admitting: *Deleted

## 2018-01-17 ENCOUNTER — Other Ambulatory Visit: Payer: PPO | Admitting: *Deleted

## 2018-01-17 DIAGNOSIS — I480 Paroxysmal atrial fibrillation: Secondary | ICD-10-CM | POA: Diagnosis not present

## 2018-01-17 DIAGNOSIS — I10 Essential (primary) hypertension: Secondary | ICD-10-CM

## 2018-01-17 DIAGNOSIS — L97512 Non-pressure chronic ulcer of other part of right foot with fat layer exposed: Secondary | ICD-10-CM | POA: Diagnosis not present

## 2018-01-17 DIAGNOSIS — L97518 Non-pressure chronic ulcer of other part of right foot with other specified severity: Secondary | ICD-10-CM | POA: Diagnosis not present

## 2018-01-17 DIAGNOSIS — G603 Idiopathic progressive neuropathy: Secondary | ICD-10-CM | POA: Diagnosis not present

## 2018-01-17 DIAGNOSIS — Z6841 Body Mass Index (BMI) 40.0 and over, adult: Secondary | ICD-10-CM | POA: Diagnosis not present

## 2018-01-17 DIAGNOSIS — I739 Peripheral vascular disease, unspecified: Secondary | ICD-10-CM | POA: Diagnosis not present

## 2018-01-17 DIAGNOSIS — Z87891 Personal history of nicotine dependence: Secondary | ICD-10-CM | POA: Diagnosis not present

## 2018-01-17 DIAGNOSIS — M86172 Other acute osteomyelitis, left ankle and foot: Secondary | ICD-10-CM | POA: Diagnosis not present

## 2018-01-17 DIAGNOSIS — L97522 Non-pressure chronic ulcer of other part of left foot with fat layer exposed: Secondary | ICD-10-CM | POA: Diagnosis not present

## 2018-01-17 LAB — BASIC METABOLIC PANEL
BUN/Creatinine Ratio: 11 — ABNORMAL LOW (ref 12–28)
BUN: 13 mg/dL (ref 8–27)
CO2: 21 mmol/L (ref 20–29)
Calcium: 8.9 mg/dL (ref 8.7–10.3)
Chloride: 102 mmol/L (ref 96–106)
Creatinine, Ser: 1.21 mg/dL — ABNORMAL HIGH (ref 0.57–1.00)
GFR calc Af Amer: 54 mL/min/{1.73_m2} — ABNORMAL LOW (ref >59)
GFR calc non Af Amer: 47 mL/min/{1.73_m2} — ABNORMAL LOW (ref >59)
Glucose: 167 mg/dL — ABNORMAL HIGH (ref 65–99)
Potassium: 3.9 mmol/L (ref 3.5–5.2)
Sodium: 139 mmol/L (ref 134–144)

## 2018-01-17 LAB — MAGNESIUM: Magnesium: 1.9 mg/dL (ref 1.6–2.3)

## 2018-01-19 ENCOUNTER — Ambulatory Visit
Admission: RE | Admit: 2018-01-19 | Discharge: 2018-01-19 | Disposition: A | Discharge status: Home / Self Care | Payer: PPO | Source: Ambulatory Visit | Attending: Internal Medicine | Admitting: Internal Medicine

## 2018-01-19 ENCOUNTER — Other Ambulatory Visit: Payer: Self-pay | Admitting: Pharmacist

## 2018-01-19 ENCOUNTER — Other Ambulatory Visit (HOSPITAL_BASED_OUTPATIENT_CLINIC_OR_DEPARTMENT_OTHER): Payer: Self-pay | Admitting: Internal Medicine

## 2018-01-19 DIAGNOSIS — E08621 Diabetes mellitus due to underlying condition with foot ulcer: Secondary | ICD-10-CM

## 2018-01-19 DIAGNOSIS — L97519 Non-pressure chronic ulcer of other part of right foot with unspecified severity: Principal | ICD-10-CM

## 2018-01-19 DIAGNOSIS — L97529 Non-pressure chronic ulcer of other part of left foot with unspecified severity: Principal | ICD-10-CM

## 2018-01-19 DIAGNOSIS — E11621 Type 2 diabetes mellitus with foot ulcer: Secondary | ICD-10-CM | POA: Diagnosis not present

## 2018-01-22 DIAGNOSIS — G603 Idiopathic progressive neuropathy: Secondary | ICD-10-CM | POA: Diagnosis not present

## 2018-01-22 DIAGNOSIS — L97512 Non-pressure chronic ulcer of other part of right foot with fat layer exposed: Secondary | ICD-10-CM | POA: Diagnosis not present

## 2018-01-22 DIAGNOSIS — Z87891 Personal history of nicotine dependence: Secondary | ICD-10-CM | POA: Diagnosis not present

## 2018-01-22 DIAGNOSIS — I739 Peripheral vascular disease, unspecified: Secondary | ICD-10-CM | POA: Diagnosis not present

## 2018-01-22 DIAGNOSIS — Z5181 Encounter for therapeutic drug level monitoring: Secondary | ICD-10-CM | POA: Diagnosis not present

## 2018-01-22 DIAGNOSIS — Z7901 Long term (current) use of anticoagulants: Secondary | ICD-10-CM | POA: Diagnosis not present

## 2018-01-24 ENCOUNTER — Encounter (HOSPITAL_BASED_OUTPATIENT_CLINIC_OR_DEPARTMENT_OTHER): Payer: PPO | Attending: Physician Assistant

## 2018-01-24 ENCOUNTER — Other Ambulatory Visit: Payer: Self-pay | Admitting: Pharmacist

## 2018-01-24 DIAGNOSIS — M86672 Other chronic osteomyelitis, left ankle and foot: Secondary | ICD-10-CM | POA: Diagnosis not present

## 2018-01-24 DIAGNOSIS — I739 Peripheral vascular disease, unspecified: Secondary | ICD-10-CM | POA: Diagnosis not present

## 2018-01-24 DIAGNOSIS — Z87891 Personal history of nicotine dependence: Secondary | ICD-10-CM | POA: Insufficient documentation

## 2018-01-24 DIAGNOSIS — Z6841 Body Mass Index (BMI) 40.0 and over, adult: Secondary | ICD-10-CM | POA: Diagnosis not present

## 2018-01-24 DIAGNOSIS — L97512 Non-pressure chronic ulcer of other part of right foot with fat layer exposed: Secondary | ICD-10-CM | POA: Insufficient documentation

## 2018-01-24 DIAGNOSIS — L97522 Non-pressure chronic ulcer of other part of left foot with fat layer exposed: Secondary | ICD-10-CM | POA: Insufficient documentation

## 2018-01-24 DIAGNOSIS — G603 Idiopathic progressive neuropathy: Secondary | ICD-10-CM | POA: Diagnosis not present

## 2018-01-25 ENCOUNTER — Ambulatory Visit: Payer: PPO | Admitting: Family

## 2018-01-25 ENCOUNTER — Other Ambulatory Visit: Payer: Self-pay | Admitting: Pharmacist

## 2018-01-25 ENCOUNTER — Encounter (HOSPITAL_COMMUNITY): Payer: PPO

## 2018-01-25 DIAGNOSIS — I10 Essential (primary) hypertension: Secondary | ICD-10-CM | POA: Diagnosis not present

## 2018-01-25 DIAGNOSIS — F418 Other specified anxiety disorders: Secondary | ICD-10-CM | POA: Diagnosis not present

## 2018-01-25 DIAGNOSIS — M86672 Other chronic osteomyelitis, left ankle and foot: Secondary | ICD-10-CM | POA: Diagnosis not present

## 2018-01-25 DIAGNOSIS — D6489 Other specified anemias: Secondary | ICD-10-CM | POA: Diagnosis not present

## 2018-01-25 DIAGNOSIS — M8668 Other chronic osteomyelitis, other site: Secondary | ICD-10-CM | POA: Diagnosis not present

## 2018-01-25 DIAGNOSIS — R7309 Other abnormal glucose: Secondary | ICD-10-CM | POA: Diagnosis not present

## 2018-01-29 ENCOUNTER — Encounter: Payer: Self-pay | Admitting: Infectious Diseases

## 2018-01-29 DIAGNOSIS — I739 Peripheral vascular disease, unspecified: Secondary | ICD-10-CM | POA: Diagnosis not present

## 2018-01-29 DIAGNOSIS — L97512 Non-pressure chronic ulcer of other part of right foot with fat layer exposed: Secondary | ICD-10-CM | POA: Diagnosis not present

## 2018-01-29 DIAGNOSIS — M8668 Other chronic osteomyelitis, other site: Secondary | ICD-10-CM | POA: Diagnosis not present

## 2018-01-29 DIAGNOSIS — G603 Idiopathic progressive neuropathy: Secondary | ICD-10-CM | POA: Diagnosis not present

## 2018-01-29 DIAGNOSIS — M86672 Other chronic osteomyelitis, left ankle and foot: Secondary | ICD-10-CM | POA: Diagnosis not present

## 2018-01-29 DIAGNOSIS — L97522 Non-pressure chronic ulcer of other part of left foot with fat layer exposed: Secondary | ICD-10-CM | POA: Diagnosis not present

## 2018-01-29 DIAGNOSIS — I10 Essential (primary) hypertension: Secondary | ICD-10-CM | POA: Diagnosis not present

## 2018-01-30 DIAGNOSIS — M8668 Other chronic osteomyelitis, other site: Secondary | ICD-10-CM | POA: Diagnosis not present

## 2018-01-30 DIAGNOSIS — M86672 Other chronic osteomyelitis, left ankle and foot: Secondary | ICD-10-CM | POA: Diagnosis not present

## 2018-01-31 DIAGNOSIS — M86672 Other chronic osteomyelitis, left ankle and foot: Secondary | ICD-10-CM | POA: Diagnosis not present

## 2018-01-31 DIAGNOSIS — L97512 Non-pressure chronic ulcer of other part of right foot with fat layer exposed: Secondary | ICD-10-CM | POA: Diagnosis not present

## 2018-01-31 DIAGNOSIS — L97522 Non-pressure chronic ulcer of other part of left foot with fat layer exposed: Secondary | ICD-10-CM | POA: Diagnosis not present

## 2018-01-31 DIAGNOSIS — M8668 Other chronic osteomyelitis, other site: Secondary | ICD-10-CM | POA: Diagnosis not present

## 2018-01-31 DIAGNOSIS — G603 Idiopathic progressive neuropathy: Secondary | ICD-10-CM | POA: Diagnosis not present

## 2018-02-01 ENCOUNTER — Telehealth: Payer: Self-pay | Admitting: Internal Medicine

## 2018-02-01 DIAGNOSIS — M8668 Other chronic osteomyelitis, other site: Secondary | ICD-10-CM | POA: Diagnosis not present

## 2018-02-01 DIAGNOSIS — M86672 Other chronic osteomyelitis, left ankle and foot: Secondary | ICD-10-CM | POA: Diagnosis not present

## 2018-02-01 NOTE — Telephone Encounter (Signed)
Pt c/o medication issue:  1. Name of Medication:  dofetilide (TIKOSYN) 250 MCG capsule    2. How are you currently taking this medication (dosage and times per day)?TAKE 1 CAPSULE(250 MCG) BY MOUTH TWICE DAILY 3. Are you having a reaction (difficulty breathing--STAT)? no 4. What is your medication issue? Pt verbalized that she has received samples for  but she states that it is incorrect she takes the   Please call and let her know if she can get the  samples    Pt verbalized that she needs a call today because she don't have any medication to take

## 2018-02-01 NOTE — Telephone Encounter (Signed)
Call returned to Pt.  Notified Pt that she takes dofetilide 250 mcg one tablet by mouth twice a day, not 150. Left this nurse name and # for call back if any further questions.

## 2018-02-02 ENCOUNTER — Other Ambulatory Visit: Payer: Self-pay | Admitting: Pharmacist

## 2018-02-02 DIAGNOSIS — M86672 Other chronic osteomyelitis, left ankle and foot: Secondary | ICD-10-CM | POA: Diagnosis not present

## 2018-02-02 DIAGNOSIS — M8668 Other chronic osteomyelitis, other site: Secondary | ICD-10-CM | POA: Diagnosis not present

## 2018-02-02 NOTE — Progress Notes (Signed)
OPAT lab monitoring 

## 2018-02-03 ENCOUNTER — Other Ambulatory Visit: Payer: Self-pay | Admitting: Cardiology

## 2018-02-06 ENCOUNTER — Telehealth: Payer: Self-pay | Admitting: Nurse Practitioner

## 2018-02-06 NOTE — Telephone Encounter (Addendum)
The pt states that after I faxed her pt assistance application (Eliquis) to [employer] on 01/08/18 that [employer] mailed her a letter stating that they need her 2019 out of pocket expense report from her pharmacy. She states that she mailed that report to them about a week ago.  She is advised that I will leave her 2 boxes of Eliquis samples in the front office and that she can pick up anytime between 8 am and 5 pm M-F. She thanked me for my help.

## 2018-02-06 NOTE — Telephone Encounter (Signed)
Patient calling the office for samples of medication:   1.  What medication and dosage are you requesting samples for? [name]  2.  Are you currently out of this medication? No-a few left

## 2018-02-07 DIAGNOSIS — F3289 Other specified depressive episodes: Secondary | ICD-10-CM | POA: Diagnosis not present

## 2018-02-07 DIAGNOSIS — I7389 Other specified peripheral vascular diseases: Secondary | ICD-10-CM | POA: Diagnosis not present

## 2018-02-07 DIAGNOSIS — M86672 Other chronic osteomyelitis, left ankle and foot: Secondary | ICD-10-CM | POA: Diagnosis not present

## 2018-02-07 DIAGNOSIS — G603 Idiopathic progressive neuropathy: Secondary | ICD-10-CM | POA: Diagnosis not present

## 2018-02-07 DIAGNOSIS — L97522 Non-pressure chronic ulcer of other part of left foot with fat layer exposed: Secondary | ICD-10-CM | POA: Diagnosis not present

## 2018-02-07 DIAGNOSIS — E7849 Other hyperlipidemia: Secondary | ICD-10-CM | POA: Diagnosis not present

## 2018-02-07 DIAGNOSIS — E11621 Type 2 diabetes mellitus with foot ulcer: Secondary | ICD-10-CM | POA: Diagnosis not present

## 2018-02-07 DIAGNOSIS — R05 Cough: Secondary | ICD-10-CM | POA: Diagnosis not present

## 2018-02-07 DIAGNOSIS — I480 Paroxysmal atrial fibrillation: Secondary | ICD-10-CM | POA: Diagnosis not present

## 2018-02-07 DIAGNOSIS — I10 Essential (primary) hypertension: Secondary | ICD-10-CM | POA: Diagnosis not present

## 2018-02-07 DIAGNOSIS — L97512 Non-pressure chronic ulcer of other part of right foot with fat layer exposed: Secondary | ICD-10-CM | POA: Diagnosis not present

## 2018-02-07 DIAGNOSIS — R251 Tremor, unspecified: Secondary | ICD-10-CM | POA: Diagnosis not present

## 2018-02-07 DIAGNOSIS — Z6841 Body Mass Index (BMI) 40.0 and over, adult: Secondary | ICD-10-CM | POA: Diagnosis not present

## 2018-02-07 DIAGNOSIS — G608 Other hereditary and idiopathic neuropathies: Secondary | ICD-10-CM | POA: Diagnosis not present

## 2018-02-09 NOTE — Telephone Encounter (Signed)
**Note De-Identified Helder Crisafulli Obfuscation** Pt assistance for Eliquis has been approved per letter received Din[employer] Chavira fax from [employer]. Approval good 02/07/18 until 09/25/18. Application Case# [id number]

## 2018-02-12 ENCOUNTER — Encounter: Payer: Self-pay | Admitting: Infectious Diseases

## 2018-02-12 ENCOUNTER — Ambulatory Visit (INDEPENDENT_AMBULATORY_CARE_PROVIDER_SITE_OTHER): Payer: PPO | Admitting: Infectious Diseases

## 2018-02-12 DIAGNOSIS — L03031 Cellulitis of right toe: Secondary | ICD-10-CM | POA: Diagnosis not present

## 2018-02-12 DIAGNOSIS — M86671 Other chronic osteomyelitis, right ankle and foot: Secondary | ICD-10-CM | POA: Diagnosis not present

## 2018-02-12 DIAGNOSIS — M86672 Other chronic osteomyelitis, left ankle and foot: Secondary | ICD-10-CM | POA: Diagnosis not present

## 2018-02-12 MED ORDER — CEPHALEXIN 500 MG PO CAPS: 500.0000 mg | ORAL_CAPSULE | Freq: Four times a day (QID) | ORAL | 0 refills | Status: DC

## 2018-02-12 NOTE — Progress Notes (Signed)
   Subjective:    Patient ID: [name], female    DOB: 1951-08-07, 67 y.o.   MRN: [id number]  HPI 67 yo F with PVD (femoral stent 10-2016) and neuropathy. She has had long standing ulcers on both feet which have been progressive despite po anbx. She has also had care at wound care center.  She has small abscess on her MRI on her R foot at 2nd MTP. She had I & D (4-16), her Cx was (-). She was d/c from hospital on 4-19, cefepime for 1 month. She completed this on 5-12.  She has had pink oozing on R foot wound, this is unchanged from previous.  She feels like her foot is puffy today.  Has WOC eval 5-22.  PIC removed on 5-14, well healed.    Review of Systems  Constitutional: Positive for appetite change. Negative for chills, fever and unexpected weight change.  HENT: Positive for congestion, ear pain and postnasal drip.   Gastrointestinal: Negative for constipation and diarrhea.  Genitourinary: Negative for difficulty urinating.  Skin: Positive for wound.  Please see HPI. All other systems reviewed and negative.     Objective:   Physical Exam  Constitutional: She is oriented to person, place, and time. She appears well-developed and well-nourished.  Eyes: Pupils are equal, round, and reactive to light. EOM are normal.  Neck: Normal range of motion. Neck supple.  Cardiovascular: Normal rate, regular rhythm and normal heart sounds.  Pulmonary/Chest: Effort normal and breath sounds normal.  Abdominal: Soft. Bowel sounds are normal. She exhibits no distension. There is no tenderness.  Musculoskeletal:       Feet:  Neurological: She is alert and oriented to person, place, and time.        Assessment & Plan:

## 2018-02-12 NOTE — Assessment & Plan Note (Signed)
I am concerned about her foot.  I was unable to express d/c from the swelling adjacent to her wound.  Will start her keflex.  Will have her seen by Dr Victorino Dike asap Will see her back in 2 weeks.

## 2018-02-13 DIAGNOSIS — M86672 Other chronic osteomyelitis, left ankle and foot: Secondary | ICD-10-CM | POA: Diagnosis not present

## 2018-02-13 DIAGNOSIS — M86671 Other chronic osteomyelitis, right ankle and foot: Secondary | ICD-10-CM | POA: Diagnosis not present

## 2018-02-14 DIAGNOSIS — L97522 Non-pressure chronic ulcer of other part of left foot with fat layer exposed: Secondary | ICD-10-CM | POA: Diagnosis not present

## 2018-02-14 DIAGNOSIS — L03116 Cellulitis of left lower limb: Secondary | ICD-10-CM | POA: Diagnosis not present

## 2018-02-14 DIAGNOSIS — G603 Idiopathic progressive neuropathy: Secondary | ICD-10-CM | POA: Diagnosis not present

## 2018-02-14 DIAGNOSIS — M86672 Other chronic osteomyelitis, left ankle and foot: Secondary | ICD-10-CM | POA: Diagnosis not present

## 2018-02-14 DIAGNOSIS — M86671 Other chronic osteomyelitis, right ankle and foot: Secondary | ICD-10-CM | POA: Diagnosis not present

## 2018-02-14 DIAGNOSIS — M869 Osteomyelitis, unspecified: Secondary | ICD-10-CM | POA: Diagnosis not present

## 2018-02-15 DIAGNOSIS — M86671 Other chronic osteomyelitis, right ankle and foot: Secondary | ICD-10-CM | POA: Diagnosis not present

## 2018-02-15 DIAGNOSIS — M86672 Other chronic osteomyelitis, left ankle and foot: Secondary | ICD-10-CM | POA: Diagnosis not present

## 2018-02-16 DIAGNOSIS — M86672 Other chronic osteomyelitis, left ankle and foot: Secondary | ICD-10-CM | POA: Diagnosis not present

## 2018-02-16 DIAGNOSIS — M86671 Other chronic osteomyelitis, right ankle and foot: Secondary | ICD-10-CM | POA: Diagnosis not present

## 2018-02-20 DIAGNOSIS — M86671 Other chronic osteomyelitis, right ankle and foot: Secondary | ICD-10-CM | POA: Diagnosis not present

## 2018-02-20 DIAGNOSIS — M86672 Other chronic osteomyelitis, left ankle and foot: Secondary | ICD-10-CM | POA: Diagnosis not present

## 2018-02-21 ENCOUNTER — Other Ambulatory Visit (HOSPITAL_COMMUNITY)
Admission: RE | Admit: 2018-02-21 | Discharge: 2018-02-21 | Disposition: A | Discharge status: Home / Self Care | Payer: PPO | Source: Other Acute Inpatient Hospital | Attending: Physician Assistant | Admitting: Physician Assistant

## 2018-02-21 DIAGNOSIS — L97519 Non-pressure chronic ulcer of other part of right foot with unspecified severity: Secondary | ICD-10-CM | POA: Insufficient documentation

## 2018-02-21 DIAGNOSIS — M86672 Other chronic osteomyelitis, left ankle and foot: Secondary | ICD-10-CM | POA: Diagnosis not present

## 2018-02-21 DIAGNOSIS — M86671 Other chronic osteomyelitis, right ankle and foot: Secondary | ICD-10-CM | POA: Diagnosis not present

## 2018-02-21 DIAGNOSIS — L97522 Non-pressure chronic ulcer of other part of left foot with fat layer exposed: Secondary | ICD-10-CM | POA: Diagnosis not present

## 2018-02-21 DIAGNOSIS — G603 Idiopathic progressive neuropathy: Secondary | ICD-10-CM | POA: Diagnosis not present

## 2018-02-21 DIAGNOSIS — L97512 Non-pressure chronic ulcer of other part of right foot with fat layer exposed: Secondary | ICD-10-CM | POA: Diagnosis not present

## 2018-02-22 DIAGNOSIS — M86672 Other chronic osteomyelitis, left ankle and foot: Secondary | ICD-10-CM | POA: Diagnosis not present

## 2018-02-22 DIAGNOSIS — M86671 Other chronic osteomyelitis, right ankle and foot: Secondary | ICD-10-CM | POA: Diagnosis not present

## 2018-02-23 DIAGNOSIS — L89899 Pressure ulcer of other site, unspecified stage: Secondary | ICD-10-CM | POA: Diagnosis not present

## 2018-02-23 DIAGNOSIS — S91302D Unspecified open wound, left foot, subsequent encounter: Secondary | ICD-10-CM | POA: Diagnosis not present

## 2018-02-25 LAB — AEROBIC CULTURE  (SUPERFICIAL SPECIMEN)

## 2018-02-25 LAB — AEROBIC CULTURE W GRAM STAIN (SUPERFICIAL SPECIMEN)

## 2018-02-26 ENCOUNTER — Ambulatory Visit: Payer: PPO | Admitting: Nurse Practitioner

## 2018-02-26 ENCOUNTER — Encounter (HOSPITAL_BASED_OUTPATIENT_CLINIC_OR_DEPARTMENT_OTHER): Payer: PPO | Attending: Internal Medicine

## 2018-02-26 DIAGNOSIS — Z6841 Body Mass Index (BMI) 40.0 and over, adult: Secondary | ICD-10-CM | POA: Insufficient documentation

## 2018-02-26 DIAGNOSIS — L97519 Non-pressure chronic ulcer of other part of right foot with unspecified severity: Secondary | ICD-10-CM | POA: Insufficient documentation

## 2018-02-26 DIAGNOSIS — L97512 Non-pressure chronic ulcer of other part of right foot with fat layer exposed: Secondary | ICD-10-CM | POA: Diagnosis not present

## 2018-02-26 DIAGNOSIS — Z87891 Personal history of nicotine dependence: Secondary | ICD-10-CM | POA: Diagnosis not present

## 2018-02-26 DIAGNOSIS — M86671 Other chronic osteomyelitis, right ankle and foot: Secondary | ICD-10-CM | POA: Diagnosis not present

## 2018-02-26 DIAGNOSIS — M86672 Other chronic osteomyelitis, left ankle and foot: Secondary | ICD-10-CM | POA: Diagnosis not present

## 2018-02-26 DIAGNOSIS — L97826 Non-pressure chronic ulcer of other part of left lower leg with bone involvement without evidence of necrosis: Secondary | ICD-10-CM | POA: Diagnosis not present

## 2018-02-27 DIAGNOSIS — M86672 Other chronic osteomyelitis, left ankle and foot: Secondary | ICD-10-CM | POA: Diagnosis not present

## 2018-02-27 DIAGNOSIS — R251 Tremor, unspecified: Secondary | ICD-10-CM | POA: Diagnosis not present

## 2018-02-27 DIAGNOSIS — G608 Other hereditary and idiopathic neuropathies: Secondary | ICD-10-CM | POA: Diagnosis not present

## 2018-02-27 DIAGNOSIS — I480 Paroxysmal atrial fibrillation: Secondary | ICD-10-CM | POA: Diagnosis not present

## 2018-02-27 DIAGNOSIS — Z6839 Body mass index (BMI) 39.0-39.9, adult: Secondary | ICD-10-CM | POA: Diagnosis not present

## 2018-02-27 DIAGNOSIS — M86671 Other chronic osteomyelitis, right ankle and foot: Secondary | ICD-10-CM | POA: Diagnosis not present

## 2018-02-27 DIAGNOSIS — I7389 Other specified peripheral vascular diseases: Secondary | ICD-10-CM | POA: Diagnosis not present

## 2018-02-27 DIAGNOSIS — I10 Essential (primary) hypertension: Secondary | ICD-10-CM | POA: Diagnosis not present

## 2018-02-28 ENCOUNTER — Encounter: Payer: Self-pay | Admitting: Infectious Diseases

## 2018-02-28 ENCOUNTER — Ambulatory Visit (INDEPENDENT_AMBULATORY_CARE_PROVIDER_SITE_OTHER): Payer: PPO | Admitting: Infectious Diseases

## 2018-02-28 DIAGNOSIS — M86672 Other chronic osteomyelitis, left ankle and foot: Secondary | ICD-10-CM | POA: Diagnosis not present

## 2018-02-28 DIAGNOSIS — M86671 Other chronic osteomyelitis, right ankle and foot: Secondary | ICD-10-CM | POA: Diagnosis not present

## 2018-02-28 DIAGNOSIS — I7025 Atherosclerosis of native arteries of other extremities with ulceration: Secondary | ICD-10-CM | POA: Diagnosis not present

## 2018-02-28 DIAGNOSIS — L97512 Non-pressure chronic ulcer of other part of right foot with fat layer exposed: Secondary | ICD-10-CM | POA: Diagnosis not present

## 2018-02-28 DIAGNOSIS — L97522 Non-pressure chronic ulcer of other part of left foot with fat layer exposed: Secondary | ICD-10-CM | POA: Diagnosis not present

## 2018-02-28 DIAGNOSIS — G603 Idiopathic progressive neuropathy: Secondary | ICD-10-CM | POA: Diagnosis not present

## 2018-02-28 NOTE — Assessment & Plan Note (Signed)
Will stop her anbx, see her back in 3 weeks.  Her Cx is consistent with contamination. It is not possible to know if any (or none) of the bacteria are causative.  Without deep Cx, this will remain elusive.  will see her back off anbx and see if/how she progresses with local care.  I've asked her to call me back if she has worsening, erythema, d/c, f/c.

## 2018-02-28 NOTE — Progress Notes (Signed)
   Subjective:    Patient ID: [name], female    DOB: 11-02-1950, 67 y.o.   MRN: [id number]  HPI 67 yo F with PVD (femoral stent 10-2016) and neuropathy. She has had long standing ulcers on both feet which have been progressive despite po anbx. She has also had care at wound care center.  She has small abscess on her MRI on her R foot at 2nd MTP. She had I &D (4-16), her Cx was (-). She was d/c from hospital on 4-19, cefepime for 1 month. She completed this on 5-12. PIC removed on 5-14. Has WOC eval 5-22.  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  She saw Dr Victorino DikeHewitt 5-31 and was felt to have a pressure sore. She had Cx done which grew P aeruginosa, diptheroids, and enterococus. Pseudo and entero were pan-sens.  She had WOC f/u today.  Continues to have pain in her foot. Esp after she had wound debridement.  No f/c. Wt bearing is unchanged. Using walker.    Review of Systems  Constitutional: Negative for appetite change, chills, fever and unexpected weight change.  HENT: Positive for ear pain.   Gastrointestinal: Positive for constipation. Negative for diarrhea.  Genitourinary: Negative for difficulty urinating.  Please see HPI. All other systems reviewed and negative.      Objective:   Physical Exam  Constitutional: She appears well-developed and well-nourished.  HENT:  Mouth/Throat: No oropharyngeal exudate.  Eyes: Pupils are equal, round, and reactive to light. EOM are normal.  Neck: Normal range of motion. Neck supple.  Cardiovascular: Normal rate, regular rhythm and normal heart sounds.  Pulmonary/Chest: Effort normal and breath sounds normal.  Abdominal: Soft. Bowel sounds are normal. She exhibits no distension. There is no tenderness.  Musculoskeletal: She exhibits no edema.  Lymphadenopathy:    She has no cervical adenopathy.  Skin:              Assessment & Plan:

## 2018-03-02 DIAGNOSIS — M86672 Other chronic osteomyelitis, left ankle and foot: Secondary | ICD-10-CM | POA: Diagnosis not present

## 2018-03-02 DIAGNOSIS — M86671 Other chronic osteomyelitis, right ankle and foot: Secondary | ICD-10-CM | POA: Diagnosis not present

## 2018-03-05 DIAGNOSIS — M86671 Other chronic osteomyelitis, right ankle and foot: Secondary | ICD-10-CM | POA: Diagnosis not present

## 2018-03-05 DIAGNOSIS — M86672 Other chronic osteomyelitis, left ankle and foot: Secondary | ICD-10-CM | POA: Diagnosis not present

## 2018-03-05 DIAGNOSIS — L03116 Cellulitis of left lower limb: Secondary | ICD-10-CM | POA: Diagnosis not present

## 2018-03-05 DIAGNOSIS — M869 Osteomyelitis, unspecified: Secondary | ICD-10-CM | POA: Diagnosis not present

## 2018-03-06 DIAGNOSIS — Z5181 Encounter for therapeutic drug level monitoring: Secondary | ICD-10-CM | POA: Diagnosis not present

## 2018-03-06 DIAGNOSIS — M86671 Other chronic osteomyelitis, right ankle and foot: Secondary | ICD-10-CM | POA: Diagnosis not present

## 2018-03-06 DIAGNOSIS — M86672 Other chronic osteomyelitis, left ankle and foot: Secondary | ICD-10-CM | POA: Diagnosis not present

## 2018-03-06 DIAGNOSIS — I739 Peripheral vascular disease, unspecified: Secondary | ICD-10-CM | POA: Diagnosis not present

## 2018-03-06 DIAGNOSIS — Z87891 Personal history of nicotine dependence: Secondary | ICD-10-CM | POA: Diagnosis not present

## 2018-03-06 DIAGNOSIS — L97512 Non-pressure chronic ulcer of other part of right foot with fat layer exposed: Secondary | ICD-10-CM | POA: Diagnosis not present

## 2018-03-06 DIAGNOSIS — Z7901 Long term (current) use of anticoagulants: Secondary | ICD-10-CM | POA: Diagnosis not present

## 2018-03-06 DIAGNOSIS — G603 Idiopathic progressive neuropathy: Secondary | ICD-10-CM | POA: Diagnosis not present

## 2018-03-07 DIAGNOSIS — M86672 Other chronic osteomyelitis, left ankle and foot: Secondary | ICD-10-CM | POA: Diagnosis not present

## 2018-03-07 DIAGNOSIS — L97522 Non-pressure chronic ulcer of other part of left foot with fat layer exposed: Secondary | ICD-10-CM | POA: Diagnosis not present

## 2018-03-07 DIAGNOSIS — L97512 Non-pressure chronic ulcer of other part of right foot with fat layer exposed: Secondary | ICD-10-CM | POA: Diagnosis not present

## 2018-03-07 DIAGNOSIS — M86671 Other chronic osteomyelitis, right ankle and foot: Secondary | ICD-10-CM | POA: Diagnosis not present

## 2018-03-07 DIAGNOSIS — L97515 Non-pressure chronic ulcer of other part of right foot with muscle involvement without evidence of necrosis: Secondary | ICD-10-CM | POA: Diagnosis not present

## 2018-03-07 DIAGNOSIS — G603 Idiopathic progressive neuropathy: Secondary | ICD-10-CM | POA: Diagnosis not present

## 2018-03-08 DIAGNOSIS — M86672 Other chronic osteomyelitis, left ankle and foot: Secondary | ICD-10-CM | POA: Diagnosis not present

## 2018-03-08 DIAGNOSIS — L03116 Cellulitis of left lower limb: Secondary | ICD-10-CM | POA: Diagnosis not present

## 2018-03-08 DIAGNOSIS — M86671 Other chronic osteomyelitis, right ankle and foot: Secondary | ICD-10-CM | POA: Diagnosis not present

## 2018-03-12 DIAGNOSIS — M86672 Other chronic osteomyelitis, left ankle and foot: Secondary | ICD-10-CM | POA: Diagnosis not present

## 2018-03-12 DIAGNOSIS — M86671 Other chronic osteomyelitis, right ankle and foot: Secondary | ICD-10-CM | POA: Diagnosis not present

## 2018-03-13 DIAGNOSIS — L03116 Cellulitis of left lower limb: Secondary | ICD-10-CM | POA: Diagnosis not present

## 2018-03-13 DIAGNOSIS — M869 Osteomyelitis, unspecified: Secondary | ICD-10-CM | POA: Diagnosis not present

## 2018-03-13 DIAGNOSIS — M86672 Other chronic osteomyelitis, left ankle and foot: Secondary | ICD-10-CM | POA: Diagnosis not present

## 2018-03-14 DIAGNOSIS — G603 Idiopathic progressive neuropathy: Secondary | ICD-10-CM | POA: Diagnosis not present

## 2018-03-14 DIAGNOSIS — M86672 Other chronic osteomyelitis, left ankle and foot: Secondary | ICD-10-CM | POA: Diagnosis not present

## 2018-03-14 DIAGNOSIS — L97522 Non-pressure chronic ulcer of other part of left foot with fat layer exposed: Secondary | ICD-10-CM | POA: Diagnosis not present

## 2018-03-14 DIAGNOSIS — L97512 Non-pressure chronic ulcer of other part of right foot with fat layer exposed: Secondary | ICD-10-CM | POA: Diagnosis not present

## 2018-03-14 DIAGNOSIS — M86671 Other chronic osteomyelitis, right ankle and foot: Secondary | ICD-10-CM | POA: Diagnosis not present

## 2018-03-15 DIAGNOSIS — M86671 Other chronic osteomyelitis, right ankle and foot: Secondary | ICD-10-CM | POA: Diagnosis not present

## 2018-03-15 DIAGNOSIS — M86672 Other chronic osteomyelitis, left ankle and foot: Secondary | ICD-10-CM | POA: Diagnosis not present

## 2018-03-16 DIAGNOSIS — S91302D Unspecified open wound, left foot, subsequent encounter: Secondary | ICD-10-CM | POA: Diagnosis not present

## 2018-03-16 DIAGNOSIS — M86672 Other chronic osteomyelitis, left ankle and foot: Secondary | ICD-10-CM | POA: Diagnosis not present

## 2018-03-16 DIAGNOSIS — M86671 Other chronic osteomyelitis, right ankle and foot: Secondary | ICD-10-CM | POA: Diagnosis not present

## 2018-03-16 DIAGNOSIS — L89899 Pressure ulcer of other site, unspecified stage: Secondary | ICD-10-CM | POA: Diagnosis not present

## 2018-03-16 NOTE — Telephone Encounter (Signed)
The pt did not pick up the Eliquis samples that was placed in the front office for her to pick up. I have placed the samples back in the samples closet in the refill dept.

## 2018-03-18 DIAGNOSIS — J069 Acute upper respiratory infection, unspecified: Secondary | ICD-10-CM | POA: Diagnosis not present

## 2018-03-18 DIAGNOSIS — B085 Enteroviral vesicular pharyngitis: Secondary | ICD-10-CM | POA: Diagnosis not present

## 2018-03-21 ENCOUNTER — Ambulatory Visit (INDEPENDENT_AMBULATORY_CARE_PROVIDER_SITE_OTHER): Payer: PPO | Admitting: Infectious Diseases

## 2018-03-21 DIAGNOSIS — M86672 Other chronic osteomyelitis, left ankle and foot: Secondary | ICD-10-CM | POA: Diagnosis not present

## 2018-03-21 DIAGNOSIS — L97522 Non-pressure chronic ulcer of other part of left foot with fat layer exposed: Secondary | ICD-10-CM | POA: Diagnosis not present

## 2018-03-21 DIAGNOSIS — I7025 Atherosclerosis of native arteries of other extremities with ulceration: Secondary | ICD-10-CM

## 2018-03-21 DIAGNOSIS — G603 Idiopathic progressive neuropathy: Secondary | ICD-10-CM | POA: Diagnosis not present

## 2018-03-21 DIAGNOSIS — L97512 Non-pressure chronic ulcer of other part of right foot with fat layer exposed: Secondary | ICD-10-CM | POA: Diagnosis not present

## 2018-03-21 DIAGNOSIS — L03116 Cellulitis of left lower limb: Secondary | ICD-10-CM | POA: Diagnosis not present

## 2018-03-21 MED ORDER — DOXYCYCLINE HYCLATE 100 MG PO TABS: 100.0000 mg | ORAL_TABLET | Freq: Two times a day (BID) | ORAL | 3 refills | Status: DC

## 2018-03-21 NOTE — Progress Notes (Signed)
   Subjective:    Patient ID: [name], female    DOB: [birth year], 67 y.o.   MRN: [id number]  HPI 67 yo F with PVD (femoral stent 10-2016) and neuropathy. She has had long standing ulcers on both feet which have been progressive despite po anbx. She has also had care at wound care center.  She has small abscess on her MRI on her R foot at 2nd MTP. ShehadI &D(4-16), her Cx was (-). She was d/c from hospital on 4-19, cefepime for 1 month. She completed this on 5-12. PIC removed on 5-14. Has WOC eval 5-22.  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  She saw Dr Victorino DikeHewitt 5-31 and was felt to have a pressure sore. She had Cx done which grew P aeruginosa, diptheroids, and enterococus. Pseudo and entero were pan-sens.  She was seen 6-5, anbx were stopped. Her Cx was felt to be consistent with contamination.  She was continued on local wound care.   Since her last visit the wound on the bottom of he rL foot has "ruptured and there was an abscess in there." She is also worried that her heel is brown and she has abscess there.  Has not had proximal erythema.  She has quit hyperbaric O2 therapy.  She was seen by urgent care for ear pain and was given anbx (she does not know name, not in Epic).  Has had a fall while attempting to get to phone, first fall.   Review of Systems  Constitutional: Positive for appetite change. Negative for chills and fever.  Gastrointestinal: Negative for constipation and diarrhea.  Genitourinary: Negative for difficulty urinating.  Skin: Positive for wound.  Please see HPI. All other systems reviewed and negative.      Objective:   Physical Exam  Constitutional: She is oriented to person, place, and time. She appears well-developed and well-nourished.  HENT:  Mouth/Throat: No oropharyngeal exudate.  Eyes: Pupils are equal, round, and reactive to light. EOM are normal.  Neck: Normal range of motion. Neck supple.  Cardiovascular:  Normal rate, regular rhythm and normal heart sounds.  Pulmonary/Chest: Effort normal and breath sounds normal.  Abdominal: Soft. Bowel sounds are normal. There is no tenderness. There is no guarding.  Musculoskeletal:       Feet:  Lymphadenopathy:    She has no cervical adenopathy.  Neurological: She is alert and oriented to person, place, and time.             Assessment & Plan:

## 2018-03-21 NOTE — Assessment & Plan Note (Addendum)
She was started on po doxy on 6-21 at her ortho f/u.  Her pain in her foot is unchanged.  The abscess on her R foot was present over the medial 1st MT head. She states this has since resolved.  Will continue her on doxy and see her back in 1 month If she continues to have d/c, "abscess" will have her sent for MRI.

## 2018-03-22 ENCOUNTER — Ambulatory Visit: Payer: PPO | Admitting: Family

## 2018-03-22 ENCOUNTER — Encounter (HOSPITAL_COMMUNITY): Payer: PPO

## 2018-03-22 ENCOUNTER — Inpatient Hospital Stay (HOSPITAL_COMMUNITY): Admission: RE | Admit: 2018-03-22 | Payer: PPO | Source: Ambulatory Visit

## 2018-03-22 DIAGNOSIS — H6983 Other specified disorders of Eustachian tube, bilateral: Secondary | ICD-10-CM | POA: Diagnosis not present

## 2018-03-26 ENCOUNTER — Encounter (HOSPITAL_BASED_OUTPATIENT_CLINIC_OR_DEPARTMENT_OTHER): Payer: PPO | Attending: Internal Medicine

## 2018-03-26 DIAGNOSIS — Z87891 Personal history of nicotine dependence: Secondary | ICD-10-CM | POA: Insufficient documentation

## 2018-03-26 DIAGNOSIS — I739 Peripheral vascular disease, unspecified: Secondary | ICD-10-CM | POA: Insufficient documentation

## 2018-03-26 DIAGNOSIS — L97522 Non-pressure chronic ulcer of other part of left foot with fat layer exposed: Secondary | ICD-10-CM | POA: Insufficient documentation

## 2018-03-26 DIAGNOSIS — K1370 Unspecified lesions of oral mucosa: Secondary | ICD-10-CM | POA: Diagnosis not present

## 2018-03-26 DIAGNOSIS — H6983 Other specified disorders of Eustachian tube, bilateral: Secondary | ICD-10-CM | POA: Diagnosis not present

## 2018-03-26 DIAGNOSIS — R0602 Shortness of breath: Secondary | ICD-10-CM | POA: Diagnosis not present

## 2018-03-26 DIAGNOSIS — Z6841 Body Mass Index (BMI) 40.0 and over, adult: Secondary | ICD-10-CM | POA: Insufficient documentation

## 2018-03-26 DIAGNOSIS — L97512 Non-pressure chronic ulcer of other part of right foot with fat layer exposed: Secondary | ICD-10-CM | POA: Insufficient documentation

## 2018-03-26 DIAGNOSIS — Z6837 Body mass index (BMI) 37.0-37.9, adult: Secondary | ICD-10-CM | POA: Diagnosis not present

## 2018-03-26 DIAGNOSIS — J029 Acute pharyngitis, unspecified: Secondary | ICD-10-CM | POA: Diagnosis not present

## 2018-03-26 DIAGNOSIS — G603 Idiopathic progressive neuropathy: Secondary | ICD-10-CM | POA: Insufficient documentation

## 2018-03-26 DIAGNOSIS — M86672 Other chronic osteomyelitis, left ankle and foot: Secondary | ICD-10-CM | POA: Insufficient documentation

## 2018-03-26 DIAGNOSIS — I10 Essential (primary) hypertension: Secondary | ICD-10-CM | POA: Diagnosis not present

## 2018-03-26 DIAGNOSIS — J3089 Other allergic rhinitis: Secondary | ICD-10-CM | POA: Diagnosis not present

## 2018-03-28 DIAGNOSIS — M86672 Other chronic osteomyelitis, left ankle and foot: Secondary | ICD-10-CM | POA: Diagnosis not present

## 2018-03-28 DIAGNOSIS — L97522 Non-pressure chronic ulcer of other part of left foot with fat layer exposed: Secondary | ICD-10-CM | POA: Diagnosis not present

## 2018-03-28 DIAGNOSIS — L97512 Non-pressure chronic ulcer of other part of right foot with fat layer exposed: Secondary | ICD-10-CM | POA: Diagnosis not present

## 2018-03-28 DIAGNOSIS — I739 Peripheral vascular disease, unspecified: Secondary | ICD-10-CM | POA: Diagnosis not present

## 2018-03-28 DIAGNOSIS — Z87891 Personal history of nicotine dependence: Secondary | ICD-10-CM | POA: Diagnosis not present

## 2018-03-28 DIAGNOSIS — Z6841 Body Mass Index (BMI) 40.0 and over, adult: Secondary | ICD-10-CM | POA: Diagnosis not present

## 2018-03-28 DIAGNOSIS — G603 Idiopathic progressive neuropathy: Secondary | ICD-10-CM | POA: Diagnosis not present

## 2018-03-28 DIAGNOSIS — M86671 Other chronic osteomyelitis, right ankle and foot: Secondary | ICD-10-CM | POA: Diagnosis not present

## 2018-03-30 DIAGNOSIS — L97522 Non-pressure chronic ulcer of other part of left foot with fat layer exposed: Secondary | ICD-10-CM | POA: Diagnosis not present

## 2018-03-30 DIAGNOSIS — G603 Idiopathic progressive neuropathy: Secondary | ICD-10-CM | POA: Diagnosis not present

## 2018-03-30 DIAGNOSIS — M86672 Other chronic osteomyelitis, left ankle and foot: Secondary | ICD-10-CM | POA: Diagnosis not present

## 2018-04-02 ENCOUNTER — Inpatient Hospital Stay (HOSPITAL_COMMUNITY)
Admission: EM | Admit: 2018-04-02 | Discharge: 2018-04-14 | DRG: 070 | Disposition: A | Discharge status: Skilled Nursing Facility | Payer: PPO | Attending: Internal Medicine | Admitting: Internal Medicine

## 2018-04-02 ENCOUNTER — Emergency Department (HOSPITAL_COMMUNITY): Payer: PPO

## 2018-04-02 ENCOUNTER — Inpatient Hospital Stay (HOSPITAL_COMMUNITY): Payer: PPO

## 2018-04-02 ENCOUNTER — Encounter (HOSPITAL_COMMUNITY): Payer: Self-pay | Admitting: Emergency Medicine

## 2018-04-02 DIAGNOSIS — E669 Obesity, unspecified: Secondary | ICD-10-CM | POA: Diagnosis not present

## 2018-04-02 DIAGNOSIS — I21A1 Myocardial infarction type 2: Secondary | ICD-10-CM | POA: Diagnosis present

## 2018-04-02 DIAGNOSIS — Z885 Allergy status to narcotic agent status: Secondary | ICD-10-CM | POA: Diagnosis not present

## 2018-04-02 DIAGNOSIS — R4182 Altered mental status, unspecified: Secondary | ICD-10-CM | POA: Diagnosis not present

## 2018-04-02 DIAGNOSIS — L97519 Non-pressure chronic ulcer of other part of right foot with unspecified severity: Secondary | ICD-10-CM | POA: Diagnosis not present

## 2018-04-02 DIAGNOSIS — R140 Abdominal distension (gaseous): Secondary | ICD-10-CM | POA: Diagnosis not present

## 2018-04-02 DIAGNOSIS — R404 Transient alteration of awareness: Secondary | ICD-10-CM | POA: Diagnosis not present

## 2018-04-02 DIAGNOSIS — B9620 Unspecified Escherichia coli [E. coli] as the cause of diseases classified elsewhere: Secondary | ICD-10-CM | POA: Diagnosis not present

## 2018-04-02 DIAGNOSIS — Z87891 Personal history of nicotine dependence: Secondary | ICD-10-CM

## 2018-04-02 DIAGNOSIS — L84 Corns and callosities: Secondary | ICD-10-CM | POA: Diagnosis not present

## 2018-04-02 DIAGNOSIS — E1169 Type 2 diabetes mellitus with other specified complication: Secondary | ICD-10-CM | POA: Diagnosis present

## 2018-04-02 DIAGNOSIS — M869 Osteomyelitis, unspecified: Secondary | ICD-10-CM | POA: Diagnosis not present

## 2018-04-02 DIAGNOSIS — I6783 Posterior reversible encephalopathy syndrome: Secondary | ICD-10-CM | POA: Diagnosis not present

## 2018-04-02 DIAGNOSIS — G250 Essential tremor: Secondary | ICD-10-CM | POA: Diagnosis not present

## 2018-04-02 DIAGNOSIS — I10 Essential (primary) hypertension: Secondary | ICD-10-CM | POA: Diagnosis present

## 2018-04-02 DIAGNOSIS — S91302A Unspecified open wound, left foot, initial encounter: Secondary | ICD-10-CM | POA: Diagnosis not present

## 2018-04-02 DIAGNOSIS — R109 Unspecified abdominal pain: Secondary | ICD-10-CM

## 2018-04-02 DIAGNOSIS — G9340 Encephalopathy, unspecified: Secondary | ICD-10-CM | POA: Diagnosis not present

## 2018-04-02 DIAGNOSIS — E876 Hypokalemia: Secondary | ICD-10-CM | POA: Diagnosis present

## 2018-04-02 DIAGNOSIS — R0902 Hypoxemia: Secondary | ICD-10-CM | POA: Diagnosis not present

## 2018-04-02 DIAGNOSIS — Z7951 Long term (current) use of inhaled steroids: Secondary | ICD-10-CM

## 2018-04-02 DIAGNOSIS — Z8249 Family history of ischemic heart disease and other diseases of the circulatory system: Secondary | ICD-10-CM

## 2018-04-02 DIAGNOSIS — M86672 Other chronic osteomyelitis, left ankle and foot: Secondary | ICD-10-CM | POA: Diagnosis present

## 2018-04-02 DIAGNOSIS — Z4659 Encounter for fitting and adjustment of other gastrointestinal appliance and device: Secondary | ICD-10-CM

## 2018-04-02 DIAGNOSIS — L089 Local infection of the skin and subcutaneous tissue, unspecified: Secondary | ICD-10-CM | POA: Diagnosis not present

## 2018-04-02 DIAGNOSIS — E872 Acidosis: Secondary | ICD-10-CM | POA: Diagnosis not present

## 2018-04-02 DIAGNOSIS — N390 Urinary tract infection, site not specified: Secondary | ICD-10-CM | POA: Diagnosis present

## 2018-04-02 DIAGNOSIS — S81802A Unspecified open wound, left lower leg, initial encounter: Secondary | ICD-10-CM

## 2018-04-02 DIAGNOSIS — Z903 Acquired absence of stomach [part of]: Secondary | ICD-10-CM

## 2018-04-02 DIAGNOSIS — L89899 Pressure ulcer of other site, unspecified stage: Secondary | ICD-10-CM | POA: Diagnosis not present

## 2018-04-02 DIAGNOSIS — F329 Major depressive disorder, single episode, unspecified: Secondary | ICD-10-CM | POA: Diagnosis present

## 2018-04-02 DIAGNOSIS — E785 Hyperlipidemia, unspecified: Secondary | ICD-10-CM | POA: Diagnosis not present

## 2018-04-02 DIAGNOSIS — M2550 Pain in unspecified joint: Secondary | ICD-10-CM | POA: Diagnosis not present

## 2018-04-02 DIAGNOSIS — L02612 Cutaneous abscess of left foot: Secondary | ICD-10-CM | POA: Diagnosis not present

## 2018-04-02 DIAGNOSIS — G2581 Restless legs syndrome: Secondary | ICD-10-CM | POA: Diagnosis present

## 2018-04-02 DIAGNOSIS — G4733 Obstructive sleep apnea (adult) (pediatric): Secondary | ICD-10-CM | POA: Diagnosis not present

## 2018-04-02 DIAGNOSIS — E1142 Type 2 diabetes mellitus with diabetic polyneuropathy: Secondary | ICD-10-CM | POA: Diagnosis present

## 2018-04-02 DIAGNOSIS — R1084 Generalized abdominal pain: Secondary | ICD-10-CM | POA: Diagnosis not present

## 2018-04-02 DIAGNOSIS — R0602 Shortness of breath: Secondary | ICD-10-CM | POA: Diagnosis not present

## 2018-04-02 DIAGNOSIS — I214 Non-ST elevation (NSTEMI) myocardial infarction: Secondary | ICD-10-CM | POA: Diagnosis not present

## 2018-04-02 DIAGNOSIS — R55 Syncope and collapse: Secondary | ICD-10-CM | POA: Diagnosis not present

## 2018-04-02 DIAGNOSIS — Z1612 Extended spectrum beta lactamase (ESBL) resistance: Secondary | ICD-10-CM | POA: Diagnosis not present

## 2018-04-02 DIAGNOSIS — R52 Pain, unspecified: Secondary | ICD-10-CM | POA: Diagnosis not present

## 2018-04-02 DIAGNOSIS — R6520 Severe sepsis without septic shock: Secondary | ICD-10-CM | POA: Diagnosis not present

## 2018-04-02 DIAGNOSIS — G92 Toxic encephalopathy: Secondary | ICD-10-CM | POA: Diagnosis present

## 2018-04-02 DIAGNOSIS — Z87442 Personal history of urinary calculi: Secondary | ICD-10-CM

## 2018-04-02 DIAGNOSIS — Z881 Allergy status to other antibiotic agents status: Secondary | ICD-10-CM | POA: Diagnosis not present

## 2018-04-02 DIAGNOSIS — L97529 Non-pressure chronic ulcer of other part of left foot with unspecified severity: Secondary | ICD-10-CM | POA: Diagnosis not present

## 2018-04-02 DIAGNOSIS — S81801A Unspecified open wound, right lower leg, initial encounter: Secondary | ICD-10-CM | POA: Diagnosis not present

## 2018-04-02 DIAGNOSIS — I341 Nonrheumatic mitral (valve) prolapse: Secondary | ICD-10-CM | POA: Diagnosis not present

## 2018-04-02 DIAGNOSIS — A419 Sepsis, unspecified organism: Secondary | ICD-10-CM | POA: Diagnosis not present

## 2018-04-02 DIAGNOSIS — I872 Venous insufficiency (chronic) (peripheral): Secondary | ICD-10-CM | POA: Diagnosis present

## 2018-04-02 DIAGNOSIS — R402441 Other coma, without documented Glasgow coma scale score, or with partial score reported, in the field [EMT or ambulance]: Secondary | ICD-10-CM | POA: Diagnosis not present

## 2018-04-02 DIAGNOSIS — G4761 Periodic limb movement disorder: Secondary | ICD-10-CM | POA: Diagnosis present

## 2018-04-02 DIAGNOSIS — D649 Anemia, unspecified: Secondary | ICD-10-CM | POA: Diagnosis not present

## 2018-04-02 DIAGNOSIS — F419 Anxiety disorder, unspecified: Secondary | ICD-10-CM | POA: Diagnosis present

## 2018-04-02 DIAGNOSIS — S81802S Unspecified open wound, left lower leg, sequela: Secondary | ICD-10-CM | POA: Diagnosis not present

## 2018-04-02 DIAGNOSIS — Z4682 Encounter for fitting and adjustment of non-vascular catheter: Secondary | ICD-10-CM | POA: Diagnosis not present

## 2018-04-02 DIAGNOSIS — R609 Edema, unspecified: Secondary | ICD-10-CM | POA: Diagnosis not present

## 2018-04-02 DIAGNOSIS — R4020 Unspecified coma: Secondary | ICD-10-CM | POA: Diagnosis not present

## 2018-04-02 DIAGNOSIS — L8990 Pressure ulcer of unspecified site, unspecified stage: Secondary | ICD-10-CM | POA: Diagnosis present

## 2018-04-02 DIAGNOSIS — B9629 Other Escherichia coli [E. coli] as the cause of diseases classified elsewhere: Secondary | ICD-10-CM | POA: Diagnosis not present

## 2018-04-02 DIAGNOSIS — I480 Paroxysmal atrial fibrillation: Secondary | ICD-10-CM | POA: Diagnosis present

## 2018-04-02 DIAGNOSIS — Z888 Allergy status to other drugs, medicaments and biological substances status: Secondary | ICD-10-CM

## 2018-04-02 DIAGNOSIS — N182 Chronic kidney disease, stage 2 (mild): Secondary | ICD-10-CM

## 2018-04-02 DIAGNOSIS — J9690 Respiratory failure, unspecified, unspecified whether with hypoxia or hypercapnia: Secondary | ICD-10-CM

## 2018-04-02 DIAGNOSIS — E1151 Type 2 diabetes mellitus with diabetic peripheral angiopathy without gangrene: Secondary | ICD-10-CM | POA: Diagnosis present

## 2018-04-02 DIAGNOSIS — I351 Nonrheumatic aortic (valve) insufficiency: Secondary | ICD-10-CM | POA: Diagnosis not present

## 2018-04-02 DIAGNOSIS — I248 Other forms of acute ischemic heart disease: Secondary | ICD-10-CM | POA: Diagnosis not present

## 2018-04-02 DIAGNOSIS — I4891 Unspecified atrial fibrillation: Secondary | ICD-10-CM

## 2018-04-02 DIAGNOSIS — I161 Hypertensive emergency: Secondary | ICD-10-CM | POA: Diagnosis not present

## 2018-04-02 DIAGNOSIS — Z7401 Bed confinement status: Secondary | ICD-10-CM | POA: Diagnosis not present

## 2018-04-02 DIAGNOSIS — S91301A Unspecified open wound, right foot, initial encounter: Secondary | ICD-10-CM | POA: Diagnosis not present

## 2018-04-02 DIAGNOSIS — S81809A Unspecified open wound, unspecified lower leg, initial encounter: Secondary | ICD-10-CM

## 2018-04-02 DIAGNOSIS — N179 Acute kidney failure, unspecified: Secondary | ICD-10-CM | POA: Diagnosis present

## 2018-04-02 DIAGNOSIS — R4701 Aphasia: Secondary | ICD-10-CM | POA: Diagnosis present

## 2018-04-02 DIAGNOSIS — Z8673 Personal history of transient ischemic attack (TIA), and cerebral infarction without residual deficits: Secondary | ICD-10-CM

## 2018-04-02 DIAGNOSIS — Z978 Presence of other specified devices: Secondary | ICD-10-CM | POA: Diagnosis not present

## 2018-04-02 DIAGNOSIS — K219 Gastro-esophageal reflux disease without esophagitis: Secondary | ICD-10-CM | POA: Diagnosis not present

## 2018-04-02 DIAGNOSIS — E11621 Type 2 diabetes mellitus with foot ulcer: Secondary | ICD-10-CM | POA: Diagnosis not present

## 2018-04-02 DIAGNOSIS — Z79899 Other long term (current) drug therapy: Secondary | ICD-10-CM

## 2018-04-02 DIAGNOSIS — L97522 Non-pressure chronic ulcer of other part of left foot with fat layer exposed: Secondary | ICD-10-CM

## 2018-04-02 DIAGNOSIS — Z96653 Presence of artificial knee joint, bilateral: Secondary | ICD-10-CM | POA: Diagnosis present

## 2018-04-02 LAB — URINALYSIS, ROUTINE W REFLEX MICROSCOPIC
Bilirubin Urine: NEGATIVE
GLUCOSE, UA: NEGATIVE mg/dL
Ketones, ur: 20 mg/dL — AB
LEUKOCYTES UA: NEGATIVE
Nitrite: POSITIVE — AB
Protein, ur: 30 mg/dL — AB
SPECIFIC GRAVITY, URINE: 1.023 (ref 1.005–1.030)
pH: 5 (ref 5.0–8.0)

## 2018-04-02 LAB — CBC WITH DIFFERENTIAL/PLATELET
ABS IMMATURE GRANULOCYTES: 0.1 10*3/uL (ref 0.0–0.1)
BASOS ABS: 0 10*3/uL (ref 0.0–0.1)
BASOS PCT: 0 %
EOS ABS: 0 10*3/uL (ref 0.0–0.7)
Eosinophils Relative: 0 %
HCT: 43.4 % (ref 36.0–46.0)
Hemoglobin: 12.9 g/dL (ref 12.0–15.0)
IMMATURE GRANULOCYTES: 1 %
Lymphocytes Relative: 9 %
Lymphs Abs: 1.2 10*3/uL (ref 0.7–4.0)
MCH: 25.2 pg — ABNORMAL LOW (ref 26.0–34.0)
MCHC: 29.7 g/dL — ABNORMAL LOW (ref 30.0–36.0)
MCV: 84.8 fL (ref 78.0–100.0)
Monocytes Absolute: 1.1 10*3/uL — ABNORMAL HIGH (ref 0.1–1.0)
Monocytes Relative: 8 %
NEUTROS ABS: 11.1 10*3/uL — AB (ref 1.7–7.7)
NEUTROS PCT: 82 %
PLATELETS: 530 10*3/uL — AB (ref 150–400)
RBC: 5.12 MIL/uL — AB (ref 3.87–5.11)
RDW: 18.9 % — AB (ref 11.5–15.5)
WBC: 13.5 10*3/uL — AB (ref 4.0–10.5)

## 2018-04-02 LAB — COMPREHENSIVE METABOLIC PANEL
ALT: 42 U/L (ref 0–44)
AST: 74 U/L — AB (ref 15–41)
Albumin: 2.9 g/dL — ABNORMAL LOW (ref 3.5–5.0)
Alkaline Phosphatase: 138 U/L — ABNORMAL HIGH (ref 38–126)
Anion gap: 17 — ABNORMAL HIGH (ref 5–15)
BUN: 37 mg/dL — AB (ref 8–23)
CHLORIDE: 106 mmol/L (ref 98–111)
CO2: 18 mmol/L — ABNORMAL LOW (ref 22–32)
CREATININE: 1.4 mg/dL — AB (ref 0.44–1.00)
Calcium: 9.3 mg/dL (ref 8.9–10.3)
GFR, EST AFRICAN AMERICAN: 44 mL/min — AB (ref >60)
GFR, EST NON AFRICAN AMERICAN: 38 mL/min — AB (ref >60)
Glucose, Bld: 135 mg/dL — ABNORMAL HIGH (ref 70–99)
POTASSIUM: 4 mmol/L (ref 3.5–5.1)
SODIUM: 141 mmol/L (ref 135–145)
Total Bilirubin: 0.9 mg/dL (ref 0.3–1.2)
Total Protein: 8.2 g/dL — ABNORMAL HIGH (ref 6.5–8.1)

## 2018-04-02 LAB — PROCALCITONIN: Procalcitonin: 0.12 ng/mL

## 2018-04-02 LAB — I-STAT VENOUS BLOOD GAS, ED
ACID-BASE DEFICIT: 8 mmol/L — AB (ref 0.0–2.0)
BICARBONATE: 18.2 mmol/L — AB (ref 20.0–28.0)
O2 SAT: 55 %
PO2 VEN: 32 mmHg (ref 32.0–45.0)
TCO2: 19 mmol/L — AB (ref 22–32)
pCO2, Ven: 38.1 mmHg — ABNORMAL LOW (ref 44.0–60.0)
pH, Ven: 7.286 (ref 7.250–7.430)

## 2018-04-02 LAB — I-STAT CG4 LACTIC ACID, ED
LACTIC ACID, VENOUS: 1.97 mmol/L — AB (ref 0.5–1.9)
LACTIC ACID, VENOUS: 2.87 mmol/L — AB (ref 0.5–1.9)
Lactic Acid, Venous: 3.26 mmol/L (ref 0.5–1.9)

## 2018-04-02 LAB — GLUCOSE, CAPILLARY: GLUCOSE-CAPILLARY: 100 mg/dL — AB (ref 70–99)

## 2018-04-02 LAB — CK: CK TOTAL: 219 U/L (ref 38–234)

## 2018-04-02 LAB — LIPASE, BLOOD: LIPASE: 43 U/L (ref 11–51)

## 2018-04-02 LAB — RAPID URINE DRUG SCREEN, HOSP PERFORMED
AMPHETAMINES: NOT DETECTED
BENZODIAZEPINES: NOT DETECTED
Cocaine: NOT DETECTED
Opiates: NOT DETECTED
Tetrahydrocannabinol: NOT DETECTED

## 2018-04-02 LAB — PROTIME-INR
INR: 1.13
PROTHROMBIN TIME: 14.4 s (ref 11.4–15.2)

## 2018-04-02 LAB — I-STAT TROPONIN, ED
TROPONIN I, POC: 2.18 ng/mL — AB (ref 0.00–0.08)
Troponin i, poc: 2.16 ng/mL (ref 0.00–0.08)

## 2018-04-02 LAB — APTT: APTT: 34 s (ref 24–36)

## 2018-04-02 LAB — AMMONIA: AMMONIA: 21 umol/L (ref 9–35)

## 2018-04-02 MED ORDER — VANCOMYCIN HCL IN DEXTROSE 1-5 GM/200ML-% IV SOLN
1000.0000 mg | Freq: Once | INTRAVENOUS | Status: AC
  Administered 2018-04-02: 1000 mg via INTRAVENOUS
  Filled 2018-04-02: qty 200

## 2018-04-02 MED ORDER — SODIUM CHLORIDE 0.9 % IV BOLUS
1000.0000 mL | Freq: Once | INTRAVENOUS | Status: AC
  Administered 2018-04-02: 1000 mL via INTRAVENOUS

## 2018-04-02 MED ORDER — STERILE WATER FOR INJECTION IV SOLN
INTRAVENOUS | Status: DC
  Administered 2018-04-02 – 2018-04-03 (×2): via INTRAVENOUS
  Filled 2018-04-02 (×5): qty 850

## 2018-04-02 MED ORDER — SODIUM CHLORIDE 0.9 % IV SOLN
2.0000 g | Freq: Once | INTRAVENOUS | Status: AC
  Administered 2018-04-02: 2 g via INTRAVENOUS
  Filled 2018-04-02: qty 2

## 2018-04-02 MED ORDER — SODIUM CHLORIDE 0.9 % IV SOLN
1.0000 g | Freq: Three times a day (TID) | INTRAVENOUS | Status: DC
  Administered 2018-04-03: 1 g via INTRAVENOUS
  Filled 2018-04-02 (×3): qty 1

## 2018-04-02 MED ORDER — LEVOFLOXACIN IN D5W 750 MG/150ML IV SOLN: 750.0000 mg | INTRAVENOUS | Status: DC

## 2018-04-02 MED ORDER — AMIODARONE LOAD VIA INFUSION
150.0000 mg | Freq: Once | INTRAVENOUS | Status: AC
  Administered 2018-04-03: 150 mg via INTRAVENOUS
  Filled 2018-04-02: qty 83.34

## 2018-04-02 MED ORDER — AMIODARONE HCL IN DEXTROSE 360-4.14 MG/200ML-% IV SOLN
60.0000 mg/h | INTRAVENOUS | Status: DC
  Administered 2018-04-03 (×2): 60 mg/h via INTRAVENOUS
  Filled 2018-04-02 (×2): qty 200

## 2018-04-02 MED ORDER — INSULIN ASPART 100 UNIT/ML ~~LOC~~ SOLN
1.0000 [IU] | Freq: Four times a day (QID) | SUBCUTANEOUS | Status: DC
  Administered 2018-04-05 (×2): 1 [IU] via SUBCUTANEOUS

## 2018-04-02 MED ORDER — LEVOFLOXACIN IN D5W 750 MG/150ML IV SOLN
750.0000 mg | Freq: Once | INTRAVENOUS | Status: AC
  Administered 2018-04-02: 750 mg via INTRAVENOUS
  Filled 2018-04-02: qty 150

## 2018-04-02 MED ORDER — VANCOMYCIN HCL IN DEXTROSE 750-5 MG/150ML-% IV SOLN
750.0000 mg | Freq: Two times a day (BID) | INTRAVENOUS | Status: DC
  Administered 2018-04-03 – 2018-04-05 (×5): 750 mg via INTRAVENOUS
  Filled 2018-04-02 (×7): qty 150

## 2018-04-02 MED ORDER — ONDANSETRON HCL 4 MG/2ML IJ SOLN
4.0000 mg | Freq: Four times a day (QID) | INTRAMUSCULAR | Status: DC | PRN
  Administered 2018-04-07: 4 mg via INTRAVENOUS
  Filled 2018-04-02: qty 2

## 2018-04-02 MED ORDER — NICARDIPINE HCL IN NACL 20-0.86 MG/200ML-% IV SOLN: 0.0000 mg/h | INTRAVENOUS | Status: DC

## 2018-04-02 MED ORDER — AMIODARONE HCL IN DEXTROSE 360-4.14 MG/200ML-% IV SOLN
30.0000 mg/h | INTRAVENOUS | Status: DC
Start: 2018-04-03 — End: 2018-04-05
  Administered 2018-04-03 – 2018-04-04 (×4): 30 mg/h via INTRAVENOUS
  Filled 2018-04-02 (×4): qty 200

## 2018-04-02 MED ORDER — ACETAMINOPHEN 650 MG RE SUPP: 650.0000 mg | Freq: Four times a day (QID) | RECTAL | Status: DC | PRN

## 2018-04-02 MED ORDER — HEPARIN (PORCINE) IN NACL 100-0.45 UNIT/ML-% IJ SOLN
1400.0000 [IU]/h | INTRAMUSCULAR | Status: DC
  Administered 2018-04-02: 1250 [IU]/h via INTRAVENOUS
  Filled 2018-04-02: qty 250

## 2018-04-02 MED ORDER — CLEVIDIPINE BUTYRATE 0.5 MG/ML IV EMUL
0.0000 mg/h | INTRAVENOUS | Status: DC
  Administered 2018-04-02: 1 mg/h via INTRAVENOUS
  Administered 2018-04-03: 3 mg/h via INTRAVENOUS
  Administered 2018-04-03: 4 mg/h via INTRAVENOUS
  Administered 2018-04-03 (×2): 2 mg/h via INTRAVENOUS
  Administered 2018-04-04: 4 mg/h via INTRAVENOUS
  Filled 2018-04-02 (×6): qty 50

## 2018-04-02 MED ORDER — FAMOTIDINE IN NACL 20-0.9 MG/50ML-% IV SOLN
20.0000 mg | Freq: Two times a day (BID) | INTRAVENOUS | Status: DC
  Administered 2018-04-03 – 2018-04-05 (×7): 20 mg via INTRAVENOUS
  Filled 2018-04-02 (×8): qty 50

## 2018-04-02 MED ORDER — LACTATED RINGERS IV BOLUS
1000.0000 mL | Freq: Once | INTRAVENOUS | Status: AC
  Administered 2018-04-02: 1000 mL via INTRAVENOUS

## 2018-04-02 MED ORDER — HEPARIN SODIUM (PORCINE) 5000 UNIT/ML IJ SOLN
5000.0000 [IU] | Freq: Three times a day (TID) | INTRAMUSCULAR | Status: DC
  Administered 2018-04-02: 5000 [IU] via SUBCUTANEOUS
  Filled 2018-04-02: qty 1

## 2018-04-02 MED ORDER — SODIUM CHLORIDE 0.9 % IV SOLN
250.0000 mL | INTRAVENOUS | Status: DC | PRN
  Administered 2018-04-05 (×2): 10 mL via INTRAVENOUS

## 2018-04-02 NOTE — ED Notes (Signed)
Pt to CT at this time.

## 2018-04-02 NOTE — Consult Note (Signed)
Neurology Consultation Reason for Consult: Abnormal CT Referring Physician: Long, J  CC: Altered mental status  History is obtained from: Patient  HPI: [name] is a 67 y.o. female with a history of essential tremor, hyperlipidemia, hypertension who was found down after last being seen on Friday.  Today, home health checked on her and found her on the ground and altered.  In the ER she had a head CT which demonstrates bilateral occipital lucencies concerning for infarct versus pres.   Of note, at baseline she has some difficulty with walking, was evaluated in 2015 by Dr. Anne Hahn from [facility] matter lesions and peripheral numbness with a EMG which was normal as well as visual evoked potential which was also normal.   LKW: Friday tpa given?: no, outside of window Premorbid modified rankin scale: Unclear    ROS: Unable to obtain due to altered mental status.   Past Medical History:  Diagnosis Date  . Anxiety   . 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  PT ALSO HAS LOWER BACK PAIN AT TIMES  . Depression   . GERD (gastroesophageal reflux disease)    RARE- OTC ANTACID IF NEEDED--PAST HX OF ULCER  . Headache    wakes up with headache   . Hyperlipidemia   . Hypertension   . Kidney stones    PT PASSED STONES--NO KNOWN STONES AT PRESENT TIMES  . Lumbago 10/02/2013  . Obesity   . OSA (obstructive sleep apnea) 12/30/2013   Very mild OSA with REM accentuation,  tested 11-29-13 at [facility] sleep, Dr Frances Furbish .   Marland Kitchen Periodic limb movement disorder (PLMD) 12/30/2013  . Restless leg syndrome   . Ulcer   . Urinary incontinence      Family History  Problem Relation Age of Onset  . Sleep apnea Father   . Hypertension Father   . Hypertension Mother   . Multiple sclerosis Sister   . Multiple sclerosis Sister      Social History:  reports that she quit smoking about 29 years ago. Her smoking use included cigarettes. She has a 40.00 pack-year smoking history. She has never used  smokeless tobacco. She reports that she does not drink alcohol or use drugs.   Exam: Current vital signs: BP 92/78   Pulse (!) 102   Temp 97.6 F (36.4 C) (Rectal)   LMP  (LMP Unknown)   SpO2 100%  Vital signs in last 24 hours: Temp:  [97.6 F (36.4 C)] 97.6 F (36.4 C) (07/08 1452) Pulse Rate:  [41-102] 102 (07/08 1815) BP: (92-167)/(75-111) 92/78 (07/08 1815) SpO2:  [97 %-100 %] 100 % (07/08 1815)   Physical Exam  Constitutional: Appears well-developed and well-nourished.  Psych: Affect appropriate to situation Eyes: No scleral injection HENT: No OP obstrucion Head: Normocephalic.  Cardiovascular: Normal rate and regular rhythm.  Respiratory: Effort normal, non-labored breathing GI: She winces to pain when any body part including her abdomen is felt Skin: WDI  Neuro: Mental Status: Patient is awake, she orients towards the examiner when I speak, she does say "uh-huh" which appears to be appropriate, not responding when the answer is no.  She follows commands in all 4 extremities Cranial Nerves: II: She fixates and tracks, actually appears to blink to threat bilaterally pupils are equal, round, and reactive to light.   III,IV, VI: She crosses midline in both directions  V: She responds to stimulation on the face bilaterally VII: Facial movement is symmetric.  VIII: hearing is  intact to voice Motor: She follows commands in all 4 extremities,?  Increased tone versus paratonia Sensory: She responds to noxious stimulation in all 4 extremities Deep Tendon Reflexes: She resists any attempt to check her reflexes Cerebellar: She does not perform, she does have prominent tremor of the jaw and arm, right greater than sign left  I have reviewed labs in epic and the results pertinent to this consultation are: CMP-elevated creatinine at 1.4, slightly elevated AST at 74  I have reviewed the images obtained: CT head bilateral occipital hypodensities with a tiny amount of petechial  hemorrhage  Impression: 67 year old female with bilateral occipital hypodensities concerning for pres versus bioccipital infarcts.  An MRI would be very helpful in helping to guide management to determine if this is strokes versus posterior reversible encephalopathy syndrome.  Recommendations: 1) MRI brain, MRA head and neck 2) Further recs pending imaging   Ritta SlotMcNeill Sreenidhi Ganson, MD [facility] [phone]  If 7pm- 7am, please page neurology on call as listed in AMION.

## 2018-04-02 NOTE — ED Notes (Signed)
Pt put on Purewick at 16:15.

## 2018-04-02 NOTE — Progress Notes (Signed)
ANTICOAGULATION CONSULT NOTE - Initial Consult  Pharmacy Consult for Heparin Indication: atrial fibrillation (while Apixaban on hold)  Allergies  Allergen Reactions  . Zolpidem Tartrate Other (See Comments)    Delusions, hallucinations  . Amoxicillin Rash and Other (See Comments)    Has patient had a PCN reaction causing immediate rash, facial/tongue/throat swelling, SOB or lightheadedness with hypotension: Yes Has patient had a PCN reaction causing severe rash involving mucus membranes or skin necrosis: No Has patient had a PCN reaction that required hospitalization: No Has patient had a PCN reaction occurring within the last 10 years: Yes If all of the above answers are "NO", then may proceed with Cephalosporin use.     Patient Measurements:    Wt: 108 kg Ht: 5'5" IBW: 57 kg Heparin Dosing Weight: 82 kg  Vital Signs: Temp: 99.7 F (37.6 C) (07/08 2304) Temp Source: Oral (07/08 2304) BP: 110/94 (07/08 2230) Pulse Rate: 95 (07/08 2225)  Labs: Recent Labs    04/02/18 1634 04/02/18 2206  HGB 12.9  --   HCT 43.4  --   PLT 530*  --   APTT  --  34  LABPROT  --  14.4  INR  --  1.13  CREATININE 1.40*  --   CKTOTAL 219  --     Estimated Creatinine Clearance: 48.3 mL/min (A) (by C-G formula based on SCr of 1.4 mg/dL (H)).   Medical History: Past Medical History:  Diagnosis Date  . Anxiety   . 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  PT ALSO HAS LOWER BACK PAIN AT TIMES  . Depression   . GERD (gastroesophageal reflux disease)    RARE- OTC ANTACID IF NEEDED--PAST HX OF ULCER  . Headache    wakes up with headache   . Hyperlipidemia   . Hypertension   . Kidney stones    PT PASSED STONES--NO KNOWN STONES AT PRESENT TIMES  . Lumbago 10/02/2013  . Obesity   . OSA (obstructive sleep apnea) 12/30/2013   Very mild OSA with REM accentuation,  [facility] Dr Frances FurbishAthar .   Marland Kitchen. Periodic limb movement disorder (PLMD) 12/30/2013  . Restless leg  syndrome   . Ulcer   . Urinary incontinence     Assessment: 66 YOF on Apixaban PTA for hx Afib - admitted with acute encephalopathy thought to be due to PRES and unable to take po medications. Pharmacy consulted to start Heparin for anticoagulation while Apixaban on hold.   Last dose of Apixaban unknown - patient was found down. Baseline labs show HL falsely elevated at 0.66, aPTT 34 - will initiate Heparin without a bolus.   Goal of Therapy:  Heparin level 0.3-0.7 units/ml Monitor platelets by anticoagulation protocol: Yes   Plan:  - Start Heparin at 1250 units/hr (12.5 ml/hr) - Baseline aPTT/HL - Will continue to monitor for any signs/symptoms of bleeding and will follow up with aPTT in 6 hours   Thank you for allowing pharmacy to be a part of this patient's care.  Georgina PillionElizabeth Rendy Lazard, PharmD, BCPS Clinical Pharmacist Pager: [phone] 04/02/2018 11:21 PM

## 2018-04-02 NOTE — ED Triage Notes (Signed)
Pt found in floor at home by home health RN , lSN  On Friday , pt covered in urine and feces and a bad wound to the right foot

## 2018-04-02 NOTE — ED Notes (Signed)
Blue, Lavender, Light Green, Gold, and 3 Dark Green tops collected from Pt upon arrival.   1 set of blood cultures collected -- still awaiting second set.   2 Dark Green tops [facility]. Everything else sent down to Main Lab.

## 2018-04-02 NOTE — ED Notes (Signed)
Attempted stick for venous blood gas. Unsuccessful. Josh, EMT will draw when pt returns from CT

## 2018-04-02 NOTE — H&P (Signed)
PULMONARY / CRITICAL CARE MEDICINE   Name: [name] MRN: [id number] DOB: [birth year]    ADMISSION DATE:  04/02/2018 CONSULTATION DATE:  7/8  REFERRING MD:  Dr. Laverta Baltimore  CHIEF COMPLAINT:  AMS -found down  HISTORY OF PRESENT ILLNESS:   67 year old female with PMH as below, which is significant for Atrial fib on Eliquis, OSA, and neuropathy. Her recent medical course has been complicated by ongoing foot wounds/ulcers. She was admitted for this back in April of this year at what time she was found to have and abscess on the R foot and underwent surgical I&D. Cultures were negative. She was treated with an extended course of IV antibiotics, Then off and on oral antibiotics. She has been followed by [name] in the ID clinic. Also followed by [name] with [facility] who felt nothing to do surgically in May.  Most recently she was started on Doxycyline 6/21 at ortho clinic as she had continued discharge. ID felt she may need further MRI.   Then 7/8 she was found down by home health aide. She was last known well Friday (7/5) afternoon. In the ED she was alert, but only intermittently answering yes/no questions. Very altered. Sent for CT scan which was concerning for infarct vs PRES. Her BP was borderline high, but diastolic notably high. She was started on cleviprex infusion. She had MRI, which was more consistent with PRES. PCCM asked to admit.   PAST MEDICAL HISTORY :  She  has a past medical history of Anxiety, Arthritis, Depression, GERD (gastroesophageal reflux disease), Headache, Hyperlipidemia, Hypertension, Kidney stones, Lumbago (10/02/2013), Obesity, OSA (obstructive sleep apnea) (12/30/2013), Periodic limb movement disorder (PLMD) (12/30/2013), Restless leg syndrome, Ulcer, and Urinary incontinence.  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  Allergies  Allergen Reactions  . Zolpidem Tartrate Other (See Comments)    Delusions, hallucinations  . Amoxicillin Rash and Other (See Comments)    Has patient had a PCN reaction causing immediate rash, facial/tongue/throat swelling, SOB or lightheadedness with hypotension: Yes Has patient had a PCN reaction causing severe rash involving mucus membranes or skin necrosis: No Has patient had a PCN reaction that required hospitalization: No Has patient had a PCN reaction occurring within the last 10 years: Yes If all of the above answers are "NO", then may proceed with Cephalosporin use.     No current facility-administered medications on file prior to encounter.    Current Outpatient Medications on File Prior to Encounter  Medication Sig  . benztropine (COGENTIN) 0.5 MG tablet Take 1.5 mg by mouth daily.  Marland Kitchen acetaminophen (TYLENOL) 325 MG tablet Take 325-650 mg by mouth every 6 (six) hours as needed for mild pain or headache.   Marland Kitchen amLODipine (NORVASC) 5 MG tablet Take 1 tablet (5 mg total) by mouth daily.  Marland Kitchen buPROPion (WELLBUTRIN XL) 150 MG 24 hr tablet Take 150 mg by mouth daily.   . carvedilol (COREG) 12.5 MG tablet Take 12.5 mg by mouth 2 (two) times daily with a meal.  . carvedilol (COREG) 6.25 MG tablet Take 1 tablet (6.25 mg total) by mouth 2 (two) times daily. (Patient not taking: Reported on 04/02/2018)  . cyanocobalamin (,VITAMIN B-12,) 1000 MCG/ML  injection Inject 1,000 mcg into the muscle every 30 (thirty) days.  Marland Kitchen dofetilide (TIKOSYN) 250 MCG capsule TAKE 1 CAPSULE(250 MCG) BY MOUTH TWICE DAILY  . doxycycline (VIBRA-TABS) 100 MG tablet Take 1 tablet (100 mg total) by mouth 2 (two) times daily.  Marland Kitchen ELIQUIS 5 MG TABS tablet Take 5 mg by  mouth 2 (two) times daily.  . fluticasone (FLONASE) 50 MCG/ACT nasal spray Place 1 spray into both nostrils daily as needed for allergies.   Marland Kitchen gabapentin (NEURONTIN) 600 MG tablet Take 600 mg by mouth 4 (four) times daily.  . hydrALAZINE (APRESOLINE) 50 MG tablet TAKE 1 AND 1/2 TABLETS(75 MG) BY MOUTH THREE TIMES DAILY  . iron polysaccharides (NIFEREX) 150 MG capsule Take 150 mg by mouth daily.  Marland Kitchen lisinopril (PRINIVIL,ZESTRIL) 40 MG tablet Take 40 mg by mouth daily.  . Magnesium Oxide 400 MG CAPS Take 1 capsule (400 mg total) by mouth daily.  . mirtazapine (REMERON) 15 MG tablet Take 15 mg by mouth daily as needed (sleep).   . Multiple Vitamins-Minerals (MULTIVITAMIN & MINERAL PO) Take 1 tablet by mouth daily.  . pravastatin (PRAVACHOL) 40 MG tablet Take 40 mg by mouth daily.   Marland Kitchen vortioxetine HBr (TRINTELLIX) 5 MG TABS tablet Take 5 mg by mouth daily.    FAMILY HISTORY:  Her indicated that her mother is deceased. She indicated that her father is deceased. She indicated that only one of her two sisters is alive. She indicated that her brother is alive. She indicated that her maternal grandmother is deceased. She indicated that her maternal grandfather is deceased. She indicated that her paternal grandmother is deceased. She indicated that the status of her paternal grandfather is unknown.   SOCIAL HISTORY: She  reports that she quit smoking about 29 years ago. Her smoking use included cigarettes. She has a 40.00 pack-year smoking history. She has never used smokeless tobacco. She reports that she does not drink alcohol or use drugs.  REVIEW OF SYSTEMS:   Unable as patient is encephalopathic and intubated.   SUBJECTIVE:    VITAL SIGNS: BP (!) 155/93   Pulse (!) 104   Temp 97.6 F (36.4 C) (Rectal)   Resp (!) 22   LMP  (LMP Unknown)   SpO2 100%   HEMODYNAMICS:    VENTILATOR SETTINGS:    INTAKE / OUTPUT: I/O last 3 completed shifts: In: 1000 [IV Piggyback:1000] Out: -    PHYSICAL EXAMINATION: General:  Elderly, chronically ill appearing female in NAD Neuro:  Spontaneously awake and alert. Not following commands. Intermittently answers yes/no HEENT:  [place]/AT, Pupils sluggish but equally reactive. No JVD. Mucous membranes VERY dry.  Cardiovascular:  RRR, no MRG Lungs:  Clear bilateral breath sounds Abdomen:  Soft, some guarding, but unable to establish significance of this. Numerous scratch marks (fingernail scratch looking) all over her abdomen.  Musculoskeletal:  Cast to left foot and ankle.  Skin:  Deep tunneled wound R   LABS:  BMET Recent Labs  Lab 04/02/18 1634  NA 141  K 4.0  CL 106  CO2 18*  BUN 37*  CREATININE 1.40*  GLUCOSE 135*    Electrolytes Recent Labs  Lab 04/02/18 1634  CALCIUM 9.3    CBC Recent Labs  Lab 04/02/18 1634  WBC 13.5*  HGB 12.9  HCT 43.4  PLT 530*    Coag's No results for input(s): APTT, INR in the last 168 hours.  Sepsis Markers Recent Labs  Lab 04/02/18 1647 04/02/18 2030  LATICACIDVEN 3.26* 1.97*  ABG No results for input(s): PHART, PCO2ART, PO2ART in the last 168 hours.  Liver Enzymes Recent Labs  Lab 04/02/18 1634  AST 74*  ALT 42  ALKPHOS 138*  BILITOT 0.9  ALBUMIN 2.9*    Cardiac Enzymes No results for input(s): TROPONINI, PROBNP in the last 168 hours.  Glucose No results for input(s): GLUCAP in the last 168 hours.  Imaging Ct Head Wo Contrast  Result Date: 04/02/2018 CLINICAL DATA:  Found down. Last seen normal 3 days ago. History of hypertension, hyperlipidemia. EXAM: CT HEAD WITHOUT CONTRAST TECHNIQUE: Contiguous axial images were obtained from the base of the skull through the vertex without intravenous contrast. COMPARISON:  CT HEAD March 22, 2017 FINDINGS: Moderate motion degraded examination. BRAIN: Symmetric occipital lobe wedge-like hypodensities with focal blurring of the gray-white matter junction in RIGHT subcentimeter petechial hemorrhage. 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 Moderate parenchymal brain volume loss. No hydrocephalus. Patchy to confluent supratentorial white matter hypodensities. No midline shift or mass effect. No abnormal extra-axial fluid collections. VASCULAR: Moderate calcific atherosclerosis of the carotid siphons. SKULL: No skull fracture. No significant scalp soft tissue swelling. SINUSES/ORBITS: Trace RIGHT mastoid effusion.The included ocular globes and orbital contents are non-suspicious. OTHER: None. IMPRESSION: 1. Bilateral occipital lobe/PCA territory acute infarcts versus PRES. RIGHT occipital lobe petechial hemorrhage. 2. Small potentially acute LEFT MCA territory infarct or PRES. 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 3. Moderate parenchymal brain volume loss. Moderate chronic small vessel ischemic changes. 4. Acute findings discussed with and reconfirmed by Dr.Long on 04/02/2018 at 5:18 pm. Electronically Signed   By: Elon Alas M.D.   On: 04/02/2018 17:20   Mr Brain Wo Contrast  Result Date: 04/02/2018 CLINICAL DATA:  67 y/o F; found down. Altered level of consciousness. Abnormal CT head. EXAM: MRI HEAD WITHOUT CONTRAST TECHNIQUE: Multiplanar, multiecho pulse sequences of the brain and surrounding structures were obtained without intravenous contrast. COMPARISON:  04/02/2018 CT head.  11/08/2013 MRI head. FINDINGS: Brain: Severe motion degradation. There is T2 hyperintense signal abnormality involving cortex and subcortical white matter in a posterior distribution involving predominantly the occipital and parietal lobes. There is scattered intermittent associated cortical reduced diffusion. Prominent retrocerebellar extra-axial space, probably a small arachnoid cyst. Very small chronic infarcts are present within the inferior cerebellar hemispheres bilaterally. Stable background of moderate chronic microvascular ischemic changes and parenchymal volume loss of the brain. No gross  susceptibility hypointensity to indicate intracranial hemorrhage. Vascular: Motion degraded. Skull and upper cervical spine: Normal marrow signal. Sinuses/Orbits: Bilateral sphenoid sinus effusions. No abnormal signal of mastoid air cells. Orbits are unremarkable. Other: None. IMPRESSION: 1. Severe motion degradation. 2. T2 hyperintense signal abnormality involving cortex and subcortical white matter in a posterior distribution. Scattered cortical reduced diffusion. Findings are consistent with PRES and atypical for ischemia. No gross acute hemorrhage identified. Electronically Signed   By: Kristine Garbe M.D.   On: 04/02/2018 20:11   Dg Chest Portable 1 View  Result Date: 04/02/2018 CLINICAL DATA:  Found down in her home today, hypoxia, history hypertension, former smoker EXAM: PORTABLE CHEST 1 VIEW COMPARISON:  Portable exam 1521 hours compared to 01/19/2018 FINDINGS: Normal heart size, mediastinal contours, and pulmonary vascularity. Numerous EKG leads project over chest. Lungs clear. No acute infiltrate, pleural effusion or pneumothorax. Bones unremarkable. IMPRESSION: No acute abnormalities. Electronically Signed   By: Lavonia Dana M.D.   On: 04/02/2018 16:08   Dg Foot 2 Views Left  Result Date: 04/02/2018 CLINICAL DATA:  The patient  was found down today. Wound to the lower extremity. EXAM: LEFT FOOT - 2 VIEW COMPARISON:  Radiographs dated 06/05/2017 and MRI dated 12/08/2017 FINDINGS: Prominent hallux valgus deformity. Chronic arthritic changes of the first MTP joint. Soft tissue swelling lateral to the fifth MTP joint. Slight osteopenia at the fifth MTP joint without discrete bone destruction. Slight degenerative changes in the dorsum of the midfoot. IMPRESSION: No acute abnormalities. Degenerative changes as described. Chronic soft tissue swelling lateral to the fifth MTP joint. Electronically Signed   By: Lorriane Shire M.D.   On: 04/02/2018 16:20   Dg Foot 2 Views Right  Result Date:  04/02/2018 CLINICAL DATA:  Wound on the right foot. EXAM: RIGHT FOOT - 2 VIEW COMPARISON:  Radiographs dated 01/07/2018 and 12/19/2017 and MRI dated 01/08/2018 and CT scan dated 06/08/2015 FINDINGS: There is a soft tissue ulceration on the plantar aspect of the ball of the foot the level of the first MTP joint. No acute bone abnormality. Chronic deformity at the fifth MTP joint. Chronic deformity in the midfoot from prior midfoot fractures. Those changes are stable. IMPRESSION: No acute bone abnormality. No radiographic evidence suggestive of osteomyelitis. Soft tissue abscess on the ball of the foot as described. Electronically Signed   By: Lorriane Shire M.D.   On: 04/02/2018 16:02     STUDIES:  CT head 7/8 > Bilateral occipital lobe/PCA territory acute infarcts versus PRES. RIGHT occipital lobe petechial hemorrhage. Small potentially acute LEFT MCA territory infarct or PRES. 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 Moderate parenchymal brain volume loss. Moderate chronic small vessel ischemic changes. MRI head 7/8 > T2 hyperintense signal abnormality involving cortex and subcortical white matter in a posterior distribution. Scattered cortical reduced diffusion. Findings are consistent with PRES and atypical for ischemia. Xray R foot 7/8 > No acute bone abnormality. No radiographic evidence suggestive of Osteomyelitis. Soft tissue abscess. Xray R foot 7/8 > No acute abnormalities. Degenerative changes as described. Chronic soft tissue swelling lateral to the fifth MTP joint.  CULTURES: Blood 7/8 > Urine 7/8 >  ANTIBIOTICS: Aztreonam 7/8 > Vancomycin 7/8 > Levofloxacin 7/8 >  SIGNIFICANT EVENTS: 7/8 admit  LINES/TUBES:   DISCUSSION: 67 year old female with course recently   ASSESSMENT / PLAN:  PULMONARY A: OSA unclear if on CPAP  P:   Protecting airway - Supportive Care  CARDIOVASCULAR A:  Atrial fibrillation on eliquis  Hypertension NSTEMI -  likely demand  P:  Tele Clevidipine infusion with MAP goal 90-105 per neuro.  Trend troponin Holding home hydralazine, lisinopril, carvedilol, amlodipine, until able to take PO  RENAL  A:   AKI Met acidosis  P:   Follow chemistry Bicarb infusion Ensure lactic clearing  GASTROINTESTINAL A:   No acute issues  P:   NPO Pepcid  HEMATOLOGIC A:   AC for AF  P:  Holding eliquis Check coags  INFECTIOUS A:   SIRS,  Potential septic sources include feet, urine.   P:   Empiric ABX Cultures Followed closely by ID Johnnye Sima) and Ortho Doran Durand) - would consult in the AM.   ENDOCRINE A:   No acute issues  P:   CBG monitoring and SSi  NEUROLOGIC A:   PRES Essential tremor - treated with parkinsons medications.   P:   Tight BP control as above EEG in AM UDS Frequent neuro checks. Neurology following.   FAMILY  - Updates: Sister Joaquim Lai updated at length. Requests full code.   - Inter-disciplinary family  meet or Palliative Care meeting due by:  7/14   Georgann Housekeeper, AGACNP-BC [facility] Pager [phone] or [phone]  04/02/2018 10:37 PM

## 2018-04-02 NOTE — Progress Notes (Signed)
MRI severely motion restricted, however shows no obvious stroke. Instead- we can bilateral T2 hyperintensities favoring PRES.  Recommendations  Admit to ICU Started Cardene for blood pressure Goal MAP of 90-100 systolic Frequent  neurochecks EEG tomorrow- patient speaking, concern for seizure/subclincal seizures low.      Georgiana SpinnerSushanth Aroor MD [facility] [phone]   If 7pm to 7am, please call on call as listed on AMION.

## 2018-04-02 NOTE — ED Provider Notes (Signed)
[facility] EMERGENCY DEPARTMENT Provider Note   CSN: [id number] Arrival date & time: 04/02/18  1447  History   Chief Complaint Chief Complaint  Patient presents with  . Altered Mental Status   HPI  Patient is a 67 year old female with history of HTN, HLD, PVD and neuropathy with chronic right foot wounds presenting to the ED for altered mental status.per triage, she is found on the floor home by a home health nurse with last normal 3 days ago. She was found covered in urine and feces and with rightward gaze. Malodorous right foot reported by EMS. No other history available at this time.  Past Medical History:  Diagnosis Date  . Anxiety   . 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  PT ALSO HAS LOWER BACK PAIN AT TIMES  . Depression   . GERD (gastroesophageal reflux disease)    RARE- OTC ANTACID IF NEEDED--PAST HX OF ULCER  . Headache    wakes up with headache   . Hyperlipidemia   . Hypertension   . Kidney stones    PT PASSED STONES--NO KNOWN STONES AT PRESENT TIMES  . Lumbago 10/02/2013  . Obesity   . OSA (obstructive sleep apnea) 12/30/2013   Very mild OSA with REM accentuation,  tested 11-29-13 at [facility] sleep, Dr Frances Furbish .   Marland Kitchen Periodic limb movement disorder (PLMD) 12/30/2013  . Restless leg syndrome   . Ulcer   . Urinary incontinence     Patient Active Problem List   Diagnosis Date Noted  . PRES (posterior reversible encephalopathy syndrome) 04/02/2018  . Osteomyelitis of fifth toe of left foot (HCC) 01/10/2018  . Depression   . Hypokalemia   . Anemia   . Neuropathy   . Toe infection 01/07/2018  . Cellulitis of toe of right foot   . Osteomyelitis (HCC) 12/22/2017  . 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 Open wound of left foot 06/06/2017  . Cellulitis of foot 06/05/2017  . Paroxysmal atrial fibrillation (HCC) 03/14/2017  . Dyspnea 06/12/2015  . Morbid obesity (HCC) 06/12/2015  . Right knee DJD  11/20/2014  . Periodic limb movement disorder (PLMD) 12/30/2013  . OSA (obstructive sleep apnea) 12/30/2013  . Dizziness and giddiness 10/02/2013  . Abnormality of gait 10/02/2013  . Lumbago 10/02/2013  . Essential hypertension 02/17/2012  . 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 Palpitations 12/24/2009  . DYSPNEA ON EXERTION 12/24/2009  . CHEST PAIN 12/24/2009    Past Surgical History:  Procedure Laterality Date  . ABDOMINAL AORTOGRAM W/LOWER EXTREMITY N/A 10/10/2017   Procedure: ABDOMINAL AORTOGRAM W/LOWER EXTREMITY;  Surgeon: Nada Libman, MD;  Location: [facility] INVASIVE CV LAB;  Service: Cardiovascular;  Laterality: N/A;  . BILATERAL BUNIONECTOMY 1979    . BREAST SURGERY     biopsy   . COLON SURGERY    . PERIPHERAL VASCULAR INTERVENTION  10/10/2017   Procedure: PERIPHERAL VASCULAR INTERVENTION;  Surgeon: Nada Libman, MD;  Location: [facility] INVASIVE CV LAB;  Service: Cardiovascular;;  lt. Iliac  . SMALL INTESTINE SURGERY    . SURGERY FOR BOWEL OBSTRUCTION AND REMOVAL OF PART OF STOMACH  IN 2000  2000  . TOTAL KNEE ARTHROPLASTY  02/16/2012   Procedure: TOTAL KNEE ARTHROPLASTY;  Surgeon: Javier Docker, MD;  Location: [facility] ORS;  Service: Orthopedics;  Laterality: Left;  . TOTAL KNEE ARTHROPLASTY Right 11/20/2014   Procedure: RIGHT TOTAL KNEE ARTHROPLASTY;  Surgeon: Javier Docker, MD;  Location: [facility];  Service: Orthopedics;  Laterality: Right;     OB History   None      Home Medications    Prior to Admission medications   Medication Sig Start Date End Date Taking? Authorizing Provider  benztropine (COGENTIN) 0.5 MG tablet Take 1.5 mg by mouth daily.   Yes [provider]  acetaminophen (TYLENOL) 325 MG tablet Take 325-650 mg by mouth every 6 (six) hours as needed for mild pain or headache.     [provider]  amLODipine (NORVASC) 5 MG tablet Take 1 tablet (5 mg total) by mouth daily. 12/19/17 04/02/18  Rosalio Macadamia, NP  buPROPion (WELLBUTRIN XL)  150 MG 24 hr tablet Take 150 mg by mouth daily.     [provider]  carvedilol (COREG) 12.5 MG tablet Take 12.5 mg by mouth 2 (two) times daily with a meal.    [provider]  carvedilol (COREG) 6.25 MG tablet Take 1 tablet (6.25 mg total) by mouth 2 (two) times daily. Patient not taking: Reported on 04/02/2018 07/12/17   Corky Crafts, MD  cyanocobalamin (,VITAMIN B-12,) 1000 MCG/ML injection Inject 1,000 mcg into the muscle every 30 (thirty) days.    [provider]  dofetilide (TIKOSYN) 250 MCG capsule TAKE 1 CAPSULE(250 MCG) BY MOUTH TWICE DAILY 10/20/17   Rosalio Macadamia, NP  doxycycline (VIBRA-TABS) 100 MG tablet Take 1 tablet (100 mg total) by mouth 2 (two) times daily. 03/21/18   Ginnie Smart, MD  ELIQUIS 5 MG TABS tablet Take 5 mg by mouth 2 (two) times daily. 10/20/16   [provider]  fluticasone (FLONASE) 50 MCG/ACT nasal spray Place 1 spray into both nostrils daily as needed for allergies.     [provider]  gabapentin (NEURONTIN) 600 MG tablet Take 600 mg by mouth 4 (four) times daily.    [provider]  hydrALAZINE (APRESOLINE) 50 MG tablet TAKE 1 AND 1/2 TABLETS(75 MG) BY MOUTH THREE TIMES DAILY 02/06/18   Jake Bathe, MD  iron polysaccharides (NIFEREX) 150 MG capsule Take 150 mg by mouth daily.    [provider]  lisinopril (PRINIVIL,ZESTRIL) 40 MG tablet Take 40 mg by mouth daily.    [provider]  Magnesium Oxide 400 MG CAPS Take 1 capsule (400 mg total) by mouth daily. 12/20/17   Rosalio Macadamia, NP  mirtazapine (REMERON) 15 MG tablet Take 15 mg by mouth daily as needed (sleep).     [provider]  Multiple Vitamins-Minerals (MULTIVITAMIN & MINERAL PO) Take 1 tablet by mouth daily.    [provider]  pravastatin (PRAVACHOL) 40 MG tablet Take 40 mg by mouth daily.  11/08/17   [provider]  vortioxetine HBr (TRINTELLIX) 5 MG TABS tablet Take 5 mg by mouth  daily.    [provider]    Family History Family History  Problem Relation Age of Onset  . Sleep apnea Father   . Hypertension Father   . Hypertension Mother   . Multiple sclerosis Sister   . Multiple sclerosis Sister     Social History Social History   Tobacco Use  . Smoking status: Former Smoker    Packs/day: 2.00    Years: 20.00    Pack years: 40.00    Types: Cigarettes    Last attempt to quit: 10/17/1988    Years since quitting: 29.4  . Smokeless tobacco: Never Used  Substance Use Topics  . Alcohol use: No  . Drug  use: No    Comment: Marijuana - 25 years ago     Allergies   Zolpidem tartrate and Amoxicillin   Review of Systems Review of Systems  Unable to perform ROS: Mental status change     Physical Exam Updated Vital Signs BP (!) 110/94   Pulse 95   Temp 99.7 F (37.6 C) (Oral)   Resp (!) 24   LMP  (LMP Unknown)   SpO2 100%   Physical Exam  Constitutional:  Malodorous  HENT:  Head: Normocephalic and atraumatic.  Dry lips with irritated skin about mouth  Eyes: Pupils are equal, round, and reactive to light.  Rightward gaze which does not cross midline with stimulus on left. She will fix on right-sided targets.  Neck: Neck supple. No tracheal deviation present.  Cardiovascular: Regular rhythm, normal heart sounds and intact distal pulses. Tachycardia present.  No murmur heard. Pulmonary/Chest: Effort normal and breath sounds normal. No respiratory distress. She has no wheezes.  Abdominal: Soft. She exhibits no distension and no mass. There is no tenderness. There is no guarding.  Musculoskeletal: She exhibits no edema.  Right foot is partially flexed. There is a deep, chronic-appearing wound over the plantar surface without purulence. There are multiple superficial ulcerated wounds with malodor near the toes. Left foot is in a short leg cast.  Neurological: She is alert.  GCS 13. Moving all extremities. Withdraws to pain on right and  localized on left. LUE kept in flexion. States "ow" to painful stimulus only.   Skin: Skin is warm and dry.  Nursing note and vitals reviewed.    ED Treatments / Results  Labs (all labs ordered are listed, but only abnormal results are displayed) Labs Reviewed  CBC WITH DIFFERENTIAL/PLATELET - Abnormal; Notable for the following components:      Result Value   WBC 13.5 (*)    RBC 5.12 (*)    MCH 25.2 (*)    MCHC 29.7 (*)    RDW 18.9 (*)    Platelets 530 (*)    Neutro Abs 11.1 (*)    Monocytes Absolute 1.1 (*)    All other components within normal limits  COMPREHENSIVE METABOLIC PANEL - Abnormal; Notable for the following components:   CO2 18 (*)    Glucose, Bld 135 (*)    BUN 37 (*)    Creatinine, Ser 1.40 (*)    Total Protein 8.2 (*)    Albumin 2.9 (*)    AST 74 (*)    Alkaline Phosphatase 138 (*)    GFR calc non Af Amer 38 (*)    GFR calc Af Amer 44 (*)    Anion gap 17 (*)    All other components within normal limits  URINALYSIS, ROUTINE W REFLEX MICROSCOPIC - Abnormal; Notable for the following components:   APPearance HAZY (*)    Hgb urine dipstick SMALL (*)    Ketones, ur 20 (*)    Protein, ur 30 (*)    Nitrite POSITIVE (*)    Bacteria, UA MANY (*)    All other components within normal limits  RAPID URINE DRUG SCREEN, HOSP PERFORMED - Abnormal; Notable for the following components:   Barbiturates   (*)    Value: Result not available. Reagent lot number recalled by manufacturer.   All other components within normal limits  GLUCOSE, CAPILLARY - Abnormal; Notable for the following components:   Glucose-Capillary 100 (*)    All other components within normal limits  I-STAT CG4 LACTIC ACID,  ED - Abnormal; Notable for the following components:   Lactic Acid, Venous 3.26 (*)    All other components within normal limits  I-STAT TROPONIN, ED - Abnormal; Notable for the following components:   Troponin i, poc 2.16 (*)    All other components within normal limits    I-STAT CG4 LACTIC ACID, ED - Abnormal; Notable for the following components:   Lactic Acid, Venous 1.97 (*)    All other components within normal limits  I-STAT VENOUS BLOOD GAS, ED - Abnormal; Notable for the following components:   pCO2, Ven 38.1 (*)    Bicarbonate 18.2 (*)    TCO2 19 (*)    Acid-base deficit 8.0 (*)    All other components within normal limits  I-STAT TROPONIN, ED - Abnormal; Notable for the following components:   Troponin i, poc 2.18 (*)    All other components within normal limits  I-STAT CG4 LACTIC ACID, ED - Abnormal; Notable for the following components:   Lactic Acid, Venous 2.87 (*)    All other components within normal limits  CULTURE, BLOOD (ROUTINE X 2)  CULTURE, BLOOD (ROUTINE X 2) W REFLEX TO ID PANEL  URINE CULTURE  MRSA PCR SCREENING  AMMONIA  CK    EKG Sinus tachycardia rate of 108. Normal axis. Borderline QRS and QTc. No significant ST/T-wave changes. Rate and QRS increased as compared. Personally reviewed and interpreted.  Radiology Ct Head Wo Contrast  Result Date: 04/02/2018 CLINICAL DATA:  Found down. Last seen normal 3 days ago. History of hypertension, hyperlipidemia. EXAM: CT HEAD WITHOUT CONTRAST TECHNIQUE: Contiguous axial images were obtained from the base of the skull through the vertex without intravenous contrast. COMPARISON:  CT HEAD March 22, 2017 FINDINGS: Moderate motion degraded examination. BRAIN: Symmetric occipital lobe wedge-like hypodensities with focal blurring of the gray-white matter junction in RIGHT subcentimeter petechial hemorrhage. 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 Moderate parenchymal brain volume loss. No hydrocephalus. Patchy to confluent supratentorial white matter hypodensities. No midline shift or mass effect. No abnormal extra-axial fluid collections. VASCULAR: Moderate calcific atherosclerosis of the carotid siphons. SKULL: No skull fracture. No significant scalp soft tissue swelling. SINUSES/ORBITS: Trace  RIGHT mastoid effusion.The included ocular globes and orbital contents are non-suspicious. OTHER: None. IMPRESSION: 1. Bilateral occipital lobe/PCA territory acute infarcts versus PRES. RIGHT occipital lobe petechial hemorrhage. 2. Small potentially acute LEFT MCA territory infarct or PRES. 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 3. Moderate parenchymal brain volume loss. Moderate chronic small vessel ischemic changes. 4. Acute findings discussed with and reconfirmed by Dr.Long on 04/02/2018 at 5:18 pm. Electronically Signed   By: Awilda Metro M.D.   On: 04/02/2018 17:20   Mr Brain Wo Contrast  Result Date: 04/02/2018 CLINICAL DATA:  67 y/o F; found down. Altered level of consciousness. Abnormal CT head. EXAM: MRI HEAD WITHOUT CONTRAST TECHNIQUE: Multiplanar, multiecho pulse sequences of the brain and surrounding structures were obtained without intravenous contrast. COMPARISON:  04/02/2018 CT head.  11/08/2013 MRI head. FINDINGS: Brain: Severe motion degradation. There is T2 hyperintense signal abnormality involving cortex and subcortical white matter in a posterior distribution involving predominantly the occipital and parietal lobes. There is scattered intermittent associated cortical reduced diffusion. Prominent retrocerebellar extra-axial space, probably a small arachnoid cyst. Very small chronic infarcts are present within the inferior cerebellar hemispheres bilaterally. Stable background of moderate chronic microvascular ischemic changes and parenchymal volume loss of the brain. No gross susceptibility hypointensity to indicate intracranial hemorrhage. Vascular: Motion degraded.  Skull and upper cervical spine: Normal marrow signal. Sinuses/Orbits: Bilateral sphenoid sinus effusions. No abnormal signal of mastoid air cells. Orbits are unremarkable. Other: None. IMPRESSION: 1. Severe motion degradation. 2. T2 hyperintense signal abnormality involving cortex and subcortical  white matter in a posterior distribution. Scattered cortical reduced diffusion. Findings are consistent with PRES and atypical for ischemia. No gross acute hemorrhage identified. Electronically Signed   By: Mitzi Hansen M.D.   On: 04/02/2018 20:11   Dg Chest Portable 1 View  Result Date: 04/02/2018 CLINICAL DATA:  Found down in her home today, hypoxia, history hypertension, former smoker EXAM: PORTABLE CHEST 1 VIEW COMPARISON:  Portable exam 1521 hours compared to 01/19/2018 FINDINGS: Normal heart size, mediastinal contours, and pulmonary vascularity. Numerous EKG leads project over chest. Lungs clear. No acute infiltrate, pleural effusion or pneumothorax. Bones unremarkable. IMPRESSION: No acute abnormalities. Electronically Signed   By: Ulyses Southward M.D.   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 Calcified density in the left hemipelvis would be in keeping with the patient's calcified uterine fibroid. No bowel obstruction or significant dilatation no apparent free air. Degenerative change of the lower lumbar spine and left hip joint. IMPRESSION: Nonobstructed, nondistended bowel gas pattern. Pelvic calcification in keeping with the patient's known calcified uterine fibroid. Electronically Signed   By: Tollie Eth M.D.   On: 04/02/2018 22:25   Dg Foot 2 Views Left  Result Date: 04/02/2018 CLINICAL DATA:  The patient was found down today. Wound to the lower extremity. EXAM: LEFT FOOT - 2 VIEW COMPARISON:  Radiographs dated 06/05/2017 and MRI dated 12/08/2017 FINDINGS: Prominent hallux valgus deformity. Chronic arthritic changes of the first MTP joint. Soft tissue swelling lateral to the fifth MTP joint. Slight osteopenia at the fifth MTP joint without discrete bone destruction. Slight  degenerative changes in the dorsum of the midfoot. IMPRESSION: No acute abnormalities. Degenerative changes as described. Chronic soft tissue swelling lateral to the fifth MTP joint. Electronically Signed   By: Francene Boyers M.D.   On: 04/02/2018 16:20   Dg Foot 2 Views Right  Result Date: 04/02/2018 CLINICAL DATA:  Wound on the right foot. EXAM: RIGHT FOOT - 2 VIEW COMPARISON:  Radiographs dated 01/07/2018 and 12/19/2017 and MRI dated 01/08/2018 and CT scan dated 06/08/2015 FINDINGS: There is a soft tissue ulceration on the plantar aspect of the ball of the foot the level of the first MTP joint. No acute bone abnormality. Chronic deformity at the fifth MTP joint. Chronic deformity in the midfoot from prior midfoot fractures. Those changes are stable. IMPRESSION: No acute bone abnormality. No radiographic evidence suggestive of osteomyelitis. Soft tissue abscess on the ball of the foot as described. Electronically Signed   By: Francene Boyers M.D.   On: 04/02/2018 16:02    Procedures Procedures (including critical care time)   Initial Impression / Assessment and Plan / ED Course  I have reviewed the triage vital signs and the nursing notes.  Pertinent labs & imaging results that were available during my care of the patient were reviewed by me and considered in my medical decision making (see chart for details).  This is a 67 year old female presenting EMS for altered mental status with downtime up to 3 days. On arrival, patient with rightward gaze as above.  Patient did not  appear to be seizing. We initiated metabolic, infectious, and neurologic workup. CT head showed bilateral occipital infarcts versus PRES. Neurology was called to evaluate the patient in ED. MRI obtained which was limited due to motion but was consistent with PRES. Neurology reengaged recommended BP control. Clevidipine drip started with goal MAP reduction of 25%. Other significant results include lactic acidosis which she was  given IV fluids. We obtained plain films of her feet which showed no evidence of acute osteomyelitis. We gave IV vancomycin and Zosyn empirically given her unclear presentation. She also had significant troponin elevation. EKG shows no STEMI. Doubt ACS and feel etiology more likely to demand ischemia in the setting of critical illness. Patient admitted to ICU for further management.  Final Clinical Impressions(s) / ED Diagnoses   Final diagnoses:  Wound of lower extremity     Cecille PoMacklin, Eliah Ozawa W, MD 04/03/18 Elmer Picker0010    Long, Joshua G, MD 04/03/18 [phone]

## 2018-04-02 NOTE — Progress Notes (Signed)
Pt came to MRI limited study obtained.  Pt was twisting and turning in the scanner.  Pt sent back and RN aware.

## 2018-04-02 NOTE — ED Notes (Signed)
Dr. Kirkpatrick at bedside 

## 2018-04-02 NOTE — Progress Notes (Signed)
Pharmacy Antibiotic Note  [name] is a 67 y.o. female admitted on 04/02/2018 with sepsis.    Plan: Levofloxacin 750 mg q24h Aztreonam 2g x 1 then 1 g q8h Vanc 1 g x 1 then 750 mg q12h Monitor renal fx cx vt prn      Temp (24hrs), Avg:97.6 F (36.4 C), Min:97.6 F (36.4 C), Max:97.6 F (36.4 C)  Recent Labs  Lab 04/02/18 1634 04/02/18 1647  WBC 13.5*  --   CREATININE 1.40*  --   LATICACIDVEN  --  3.26*    Estimated Creatinine Clearance: 48.3 mL/min (A) (by C-G formula based on SCr of 1.4 mg/dL (H)).    Allergies  Allergen Reactions  . Zolpidem Tartrate Other (See Comments)    Delusions, hallucinations  . Amoxicillin Rash and Other (See Comments)    Has patient had a PCN reaction causing immediate rash, facial/tongue/throat swelling, SOB or lightheadedness with hypotension: Yes Has patient had a PCN reaction causing severe rash involving mucus membranes or skin necrosis: No Has patient had a PCN reaction that required hospitalization: No Has patient had a PCN reaction occurring within the last 10 years: Yes If all of the above answers are "NO", then may proceed with Cephalosporin use.    [name], PharmD, BCPS, BCCCP Clinical Pharmacist [phone]  Please check AMION for all [facility] numbers  04/02/2018 6:36 PM

## 2018-04-02 NOTE — Progress Notes (Signed)
eLink Physician-Brief Progress Note Patient Name: Enriqueta ShutterCarolyn I Wann DOB: [birth year] MRN: [id number]   Date of Service  04/02/2018  HPI/Events of Note  AFIB with RVR - Ventricular rate = 148. EKG reveals Atrial fibrillation with rapid ventricular response ST & T wave abnormality, consider inferior ischemia. Patient has history of AFIB and is on Eliquis chronically. Patient is currently on Cleviprex IV infusion (Ca++ channel blocker) for BP control.   eICU Interventions  Will order: 1. Amiodarone IV load and infusion. 2. BMP and Mg++ level STAT. 3. Cycle Troponin.        Alezandra Egli Dennard Nipugene 04/02/2018, 11:35 PM

## 2018-04-02 NOTE — ED Notes (Signed)
Dr. Jacqulyn BathLong notified or results for CG-4 and I-stat trop

## 2018-04-03 ENCOUNTER — Inpatient Hospital Stay (HOSPITAL_COMMUNITY): Payer: PPO

## 2018-04-03 DIAGNOSIS — Z87891 Personal history of nicotine dependence: Secondary | ICD-10-CM

## 2018-04-03 DIAGNOSIS — L8990 Pressure ulcer of unspecified site, unspecified stage: Secondary | ICD-10-CM | POA: Diagnosis present

## 2018-04-03 DIAGNOSIS — L89899 Pressure ulcer of other site, unspecified stage: Secondary | ICD-10-CM

## 2018-04-03 DIAGNOSIS — Z885 Allergy status to narcotic agent status: Secondary | ICD-10-CM

## 2018-04-03 DIAGNOSIS — Z978 Presence of other specified devices: Secondary | ICD-10-CM

## 2018-04-03 DIAGNOSIS — I6783 Posterior reversible encephalopathy syndrome: Principal | ICD-10-CM

## 2018-04-03 DIAGNOSIS — Z881 Allergy status to other antibiotic agents status: Secondary | ICD-10-CM

## 2018-04-03 DIAGNOSIS — E1151 Type 2 diabetes mellitus with diabetic peripheral angiopathy without gangrene: Secondary | ICD-10-CM

## 2018-04-03 DIAGNOSIS — E1142 Type 2 diabetes mellitus with diabetic polyneuropathy: Secondary | ICD-10-CM

## 2018-04-03 LAB — TROPONIN I
TROPONIN I: 1.84 ng/mL — AB (ref <0.03)
TROPONIN I: 2.15 ng/mL — AB (ref <0.03)
Troponin I: 1.11 ng/mL (ref <0.03)

## 2018-04-03 LAB — HEPARIN LEVEL (UNFRACTIONATED)
Heparin Unfractionated: 0.66 IU/mL (ref 0.30–0.70)
Heparin Unfractionated: 0.66 IU/mL (ref 0.30–0.70)
Heparin Unfractionated: 0.77 IU/mL — ABNORMAL HIGH (ref 0.30–0.70)

## 2018-04-03 LAB — MAGNESIUM
MAGNESIUM: 1.7 mg/dL (ref 1.7–2.4)
Magnesium: 1.7 mg/dL (ref 1.7–2.4)

## 2018-04-03 LAB — GLUCOSE, CAPILLARY
GLUCOSE-CAPILLARY: 90 mg/dL (ref 70–99)
Glucose-Capillary: 108 mg/dL — ABNORMAL HIGH (ref 70–99)
Glucose-Capillary: 98 mg/dL (ref 70–99)
Glucose-Capillary: 94 mg/dL (ref 70–99)

## 2018-04-03 LAB — CBC
HEMATOCRIT: 36.6 % (ref 36.0–46.0)
Hemoglobin: 11 g/dL — ABNORMAL LOW (ref 12.0–15.0)
MCH: 25.1 pg — ABNORMAL LOW (ref 26.0–34.0)
MCHC: 30.1 g/dL (ref 30.0–36.0)
MCV: 83.4 fL (ref 78.0–100.0)
PLATELETS: 469 10*3/uL — AB (ref 150–400)
RBC: 4.39 MIL/uL (ref 3.87–5.11)
RDW: 18.5 % — AB (ref 11.5–15.5)
WBC: 12.5 10*3/uL — AB (ref 4.0–10.5)

## 2018-04-03 LAB — BLOOD GAS, ARTERIAL
ACID-BASE EXCESS: 4 mmol/L — AB (ref 0.0–2.0)
Bicarbonate: 26.7 mmol/L (ref 20.0–28.0)
Drawn by: 39899
FIO2: 21
O2 SAT: 94.5 %
PATIENT TEMPERATURE: 99
PCO2 ART: 31.8 mmHg — AB (ref 32.0–48.0)
PO2 ART: 67.7 mmHg — AB (ref 83.0–108.0)
pH, Arterial: 7.535 — ABNORMAL HIGH (ref 7.350–7.450)

## 2018-04-03 LAB — BASIC METABOLIC PANEL
ANION GAP: 8 (ref 5–15)
Anion gap: 11 (ref 5–15)
BUN: 32 mg/dL — ABNORMAL HIGH (ref 8–23)
BUN: 28 mg/dL — AB (ref 8–23)
CHLORIDE: 113 mmol/L — AB (ref 98–111)
CO2: 20 mmol/L — AB (ref 22–32)
CO2: 22 mmol/L (ref 22–32)
CREATININE: 1.07 mg/dL — AB (ref 0.44–1.00)
CREATININE: 1.08 mg/dL — AB (ref 0.44–1.00)
Calcium: 8.2 mg/dL — ABNORMAL LOW (ref 8.9–10.3)
Calcium: 8.3 mg/dL — ABNORMAL LOW (ref 8.9–10.3)
Chloride: 109 mmol/L (ref 98–111)
GFR calc Af Amer: >60 mL/min (ref >60)
GFR calc non Af Amer: 52 mL/min — ABNORMAL LOW (ref >60)
GFR, EST NON AFRICAN AMERICAN: 53 mL/min — AB (ref >60)
GLUCOSE: 104 mg/dL — AB (ref 70–99)
Glucose, Bld: 110 mg/dL — ABNORMAL HIGH (ref 70–99)
POTASSIUM: 4.1 mmol/L (ref 3.5–5.1)
Potassium: 3.8 mmol/L (ref 3.5–5.1)
SODIUM: 141 mmol/L (ref 135–145)
Sodium: 142 mmol/L (ref 135–145)

## 2018-04-03 LAB — PHOSPHORUS: Phosphorus: 2.2 mg/dL — ABNORMAL LOW (ref 2.5–4.6)

## 2018-04-03 LAB — LACTIC ACID, PLASMA
Lactic Acid, Venous: 2.1 mmol/L (ref 0.5–1.9)
Lactic Acid, Venous: 1.7 mmol/L (ref 0.5–1.9)

## 2018-04-03 LAB — MRSA PCR SCREENING: MRSA by PCR: NEGATIVE

## 2018-04-03 LAB — HIV ANTIBODY (ROUTINE TESTING W REFLEX): HIV SCREEN 4TH GENERATION: NONREACTIVE

## 2018-04-03 LAB — APTT
APTT: 54 s — AB (ref 24–36)
aPTT: 56 seconds — ABNORMAL HIGH (ref 24–36)

## 2018-04-03 LAB — STREP PNEUMONIAE URINARY ANTIGEN: STREP PNEUMO URINARY ANTIGEN: NEGATIVE

## 2018-04-03 MED ORDER — ASPIRIN 300 MG RE SUPP
150.0000 mg | Freq: Every day | RECTAL | Status: DC
  Administered 2018-04-03 – 2018-04-05 (×3): 150 mg via RECTAL
  Filled 2018-04-03 (×3): qty 1

## 2018-04-03 MED ORDER — CEFEPIME HCL 1 G IJ SOLR
1.0000 g | Freq: Three times a day (TID) | INTRAMUSCULAR | Status: DC
  Administered 2018-04-03 – 2018-04-05 (×6): 1 g via INTRAVENOUS
  Filled 2018-04-03 (×7): qty 1

## 2018-04-03 MED ORDER — METOPROLOL TARTRATE 5 MG/5ML IV SOLN
2.5000 mg | Freq: Four times a day (QID) | INTRAVENOUS | Status: DC
  Administered 2018-04-03 – 2018-04-05 (×10): 2.5 mg via INTRAVENOUS
  Filled 2018-04-03 (×10): qty 5

## 2018-04-03 MED ORDER — ORAL CARE MOUTH RINSE
15.0000 mL | Freq: Two times a day (BID) | OROMUCOSAL | Status: DC
  Administered 2018-04-03 – 2018-04-05 (×4): 15 mL via OROMUCOSAL

## 2018-04-03 NOTE — Progress Notes (Signed)
Subjective: No longer following commands  Exam: Vitals:   04/03/18 0745 04/03/18 0800  BP: 132/71 128/70  Pulse: 77 80  Resp: (!) 24 18  Temp:    SpO2: 97% 99%   Gen: In bed, NAD Resp: non-labored breathing, no acute distress Abd: soft, nt  Neuro: MS: Awake, fixates and tracks, exaggerated startle reflex. She does not follow commands or verbalize.  GN:FAOZHYCN:blinks to threat bilaterally Motor: moves all extremities quasi-purposefully Sensory:withdraws to nox stim x 4.   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 She seems aphasic and I wonder if her old left parietal infarct is playing some role in this(though she is able to talk at baseline). Her imaging was suboptimal, but the diffusion sequence was enough to see that there is clearly much more T2 change than DWI change which is more consistent with PRES than strkoe.   Recommendations: 1) Repeat MRI once able.  2) EEG 3) continue BP control, goal normotension 4) will continue to follow.   Ritta SlotMcNeill Thurley Francesconi, MD [facility] [phone]  If 7pm- 7am, please page neurology on call as listed in AMION.

## 2018-04-03 NOTE — Progress Notes (Signed)
CRITICAL VALUE ALERT  Critical Value: Troponin 2.15  Date & Time Notied:  04/03/18 0101  Provider Notified: Dr. Arsenio LoaderSommer  Orders Received/Actions taken: ASA suppository, Metoprolol

## 2018-04-03 NOTE — Progress Notes (Signed)
Pharmacy Antibiotic Note  [name] is a 67 y.o. female admitted on 04/02/2018 with sepsis.  Currently on Aztreonam/Levaquin/Vancomycin. WBC down to 12.5. LA 2.87>1.7. Patient has tolerated cephalosporins in the past.   Plan: Stop Levaquin and Aztreonam Start Cefepime 1 gm IV Q 8 hours  Continue vancomycin 750 mg IV Q 12 hours  Monitor renal fx cx vt prn   Weight: 221 lb 1.9 oz (100.3 kg)  Temp (24hrs), Avg:98.7 F (37.1 C), Min:97.6 F (36.4 C), Max:99.7 F (37.6 C)  Recent Labs  Lab 04/02/18 1634 04/02/18 1647 04/02/18 2030 04/02/18 2215 04/03/18 0007 04/03/18 0218 04/03/18 0220 04/03/18 0543  WBC 13.5*  --   --   --   --   --  12.5*  --   CREATININE 1.40*  --   --   --  1.08*  --  1.07*  --   LATICACIDVEN  --  3.26* 1.97* 2.87*  --  2.1*  --  1.7    Estimated Creatinine Clearance: 60.7 mL/min (A) (by C-G formula based on SCr of 1.07 mg/dL (H)).    Allergies  Allergen Reactions  . Zolpidem Tartrate Other (See Comments)    Delusions, hallucinations  . Amoxicillin Rash and Other (See Comments)    Has patient had a PCN reaction causing immediate rash, facial/tongue/throat swelling, SOB or lightheadedness with hypotension: Yes Has patient had a PCN reaction causing severe rash involving mucus membranes or skin necrosis: No Has patient had a PCN reaction that required hospitalization: No Has patient had a PCN reaction occurring within the last 10 years: Yes If all of the above answers are "NO", then may proceed with Cephalosporin use.    Vinnie LevelBenjamin Khadeem Rockett, PharmD., BCPS Clinical Pharmacist Clinical phone for 04/03/18 until 3:30pm: [phone] If after 3:30pm, please refer to [facility] for unit-specific pharmacist

## 2018-04-03 NOTE — Progress Notes (Addendum)
PULMONARY / CRITICAL CARE MEDICINE   Name: SABRIEL BORROMEO MRN: [id number] DOB: [birth year]    ADMISSION DATE:  04/02/2018 CONSULTATION DATE:  7/8  REFERRING MD:  Dr. Laverta Baltimore  CHIEF COMPLAINT:  AMS -found down  HISTORY OF PRESENT ILLNESS:   67 year old female with PMH as below, which is significant for Atrial fib on Eliquis, OSA, and neuropathy. Her recent medical course has been complicated by ongoing foot wounds/ulcers. She was admitted for this back in April of this year at what time she was found to have and abscess on the R foot and underwent surgical I&D. Cultures were negative. She was treated with an extended course of IV antibiotics, Then off and on oral antibiotics. She has been followed by [name] in the ID clinic. Also followed by [name] with [facility] who felt nothing to do surgically in May.  Most recently she was started on Doxycyline 6/21 at ortho clinic as she had continued discharge. ID felt she may need further MRI.   Then 7/8 she was found down by home health aide. She was last known well Friday (7/5) afternoon. In the ED she was alert, but only intermittently answering yes/no questions. Very altered. Sent for CT scan which was concerning for infarct vs PRES. Her BP was borderline high, but diastolic notably high. She was started on cleviprex infusion. She had MRI, which was more consistent with PRES. PCCM asked to admit.     SUBJECTIVE:  67 year old female who is awake but does not follow commands.  VITAL SIGNS: BP 126/83   Pulse 78   Temp 99.1 F (37.3 C) (Axillary)   Resp (!) 22   Wt 221 lb 1.9 oz (100.3 kg)   LMP  (LMP Unknown)   SpO2 98%   BMI 36.80 kg/m   HEMODYNAMICS:    VENTILATOR SETTINGS:    INTAKE / OUTPUT: I/O last 3 completed shifts: In: 3240.9 [I.V.:2190.9; IV Piggyback:1050] Out: 1000 [Urine:1000]  PHYSICAL EXAMINATION: General: Elderly disheveled female HEENT: No JVD or lymphadenopathy is appreciated Neuro: Localizes  to pain, tracks, does not follow commands . CV: Heart sounds regular rate and rate and rhythm, currently on Cleviprex drip PULM: even/non-labored, lungs bilaterally diminished in the bases EG:BTDV, non-tender, bsx4 active  Extremities: Right foot with a soft Kerlix wrap with multiple areas of abrasion most likely from trauma rubbing her hard cast left foot, left foot and hard cast orthopedics reportedly could be completed by to remove on 04/03/2018 Skin: Multiple areas of ecchymosis scratches bilateral lower extremities   LABS:  BMET Recent Labs  Lab 04/02/18 1634 04/03/18 0007 04/03/18 0220  NA 141 141 142  K 4.0 4.1 3.8  CL 106 113* 109  CO2 18* 20* 22  BUN 37* 32* 28*  CREATININE 1.40* 1.08* 1.07*  GLUCOSE 135* 110* 104*    Electrolytes Recent Labs  Lab 04/02/18 1634 04/03/18 0007 04/03/18 0220  CALCIUM 9.3 8.2* 8.3*  MG  --  1.7 1.7  PHOS  --   --  2.2*    CBC Recent Labs  Lab 04/02/18 1634 04/03/18 0220  WBC 13.5* 12.5*  HGB 12.9 11.0*  HCT 43.4 36.6  PLT 530* 469*    Coag's Recent Labs  Lab 04/02/18 2206 04/03/18 0543  APTT 34 56*  INR 1.13  --     Sepsis Markers Recent Labs  Lab 04/02/18 2206 04/02/18 2215 04/03/18 0218 04/03/18 0543  LATICACIDVEN  --  2.87* 2.1* 1.7  PROCALCITON 0.12  --   --   --  ABG No results for input(s): PHART, PCO2ART, PO2ART in the last 168 hours.  Liver Enzymes Recent Labs  Lab 04/02/18 1634  AST 74*  ALT 42  ALKPHOS 138*  BILITOT 0.9  ALBUMIN 2.9*    Cardiac Enzymes Recent Labs  Lab 04/03/18 0007 04/03/18 0220  TROPONINI 2.15* 1.84*    Glucose Recent Labs  Lab 04/02/18 2251 04/03/18 0543  GLUCAP 100* 108*    Imaging Ct Head Wo Contrast  Result Date: 04/02/2018 CLINICAL DATA:  Found down. Last seen normal 3 days ago. History of hypertension, hyperlipidemia. EXAM: CT HEAD WITHOUT CONTRAST TECHNIQUE: Contiguous axial images were obtained from the base of the skull through the vertex  without intravenous contrast. COMPARISON:  CT HEAD March 22, 2017 FINDINGS: Moderate motion degraded examination. BRAIN: Symmetric occipital lobe wedge-like hypodensities with focal blurring of the gray-white matter junction in RIGHT subcentimeter petechial hemorrhage. 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 Moderate parenchymal brain volume loss. No hydrocephalus. Patchy to confluent supratentorial white matter hypodensities. No midline shift or mass effect. No abnormal extra-axial fluid collections. VASCULAR: Moderate calcific atherosclerosis of the carotid siphons. SKULL: No skull fracture. No significant scalp soft tissue swelling. SINUSES/ORBITS: Trace RIGHT mastoid effusion.The included ocular globes and orbital contents are non-suspicious. OTHER: None. IMPRESSION: 1. Bilateral occipital lobe/PCA territory acute infarcts versus PRES. RIGHT occipital lobe petechial hemorrhage. 2. Small potentially acute LEFT MCA territory infarct or PRES. 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 3. Moderate parenchymal brain volume loss. Moderate chronic small vessel ischemic changes. 4. Acute findings discussed with and reconfirmed by Dr.Long on 04/02/2018 at 5:18 pm. Electronically Signed   By: Elon Alas M.D.   On: 04/02/2018 17:20   Mr Brain Wo Contrast  Result Date: 04/02/2018 CLINICAL DATA:  67 y/o F; found down. Altered level of consciousness. Abnormal CT head. EXAM: MRI HEAD WITHOUT CONTRAST TECHNIQUE: Multiplanar, multiecho pulse sequences of the brain and surrounding structures were obtained without intravenous contrast. COMPARISON:  04/02/2018 CT head.  11/08/2013 MRI head. FINDINGS: Brain: Severe motion degradation. There is T2 hyperintense signal abnormality involving cortex and subcortical white matter in a posterior distribution involving predominantly the occipital and parietal lobes. There is scattered intermittent associated cortical reduced diffusion. Prominent  retrocerebellar extra-axial space, probably a small arachnoid cyst. Very small chronic infarcts are present within the inferior cerebellar hemispheres bilaterally. Stable background of moderate chronic microvascular ischemic changes and parenchymal volume loss of the brain. No gross susceptibility hypointensity to indicate intracranial hemorrhage. Vascular: Motion degraded. Skull and upper cervical spine: Normal marrow signal. Sinuses/Orbits: Bilateral sphenoid sinus effusions. No abnormal signal of mastoid air cells. Orbits are unremarkable. Other: None. IMPRESSION: 1. Severe motion degradation. 2. T2 hyperintense signal abnormality involving cortex and subcortical white matter in a posterior distribution. Scattered cortical reduced diffusion. Findings are consistent with PRES and atypical for ischemia. No gross acute hemorrhage identified. Electronically Signed   By: Kristine Garbe M.D.   On: 04/02/2018 20:11   Dg Chest Portable 1 View  Result Date: 04/02/2018 CLINICAL DATA:  Found down in her home today, hypoxia, history hypertension, former smoker EXAM: PORTABLE CHEST 1 VIEW COMPARISON:  Portable exam 1521 hours compared to 01/19/2018 FINDINGS: Normal heart size, mediastinal contours, and pulmonary vascularity. Numerous EKG leads project over chest. Lungs clear. No acute infiltrate, pleural effusion or pneumothorax. Bones unremarkable. IMPRESSION: No acute abnormalities. Electronically Signed   By: Lavonia Dana M.D.   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 Calcified density in the left hemipelvis would be in keeping with the patient's calcified uterine fibroid. No bowel obstruction or significant dilatation no apparent free air. Degenerative change of the lower lumbar spine and left hip  joint. IMPRESSION: Nonobstructed, nondistended bowel gas pattern. Pelvic calcification in keeping with the patient's known calcified uterine fibroid. Electronically Signed   By: Ashley Royalty M.D.   On: 04/02/2018 22:25   Dg Foot 2 Views Left  Result Date: 04/02/2018 CLINICAL DATA:  The patient was found down today. Wound to the lower extremity. EXAM: LEFT FOOT - 2 VIEW COMPARISON:  Radiographs dated 06/05/2017 and MRI dated 12/08/2017 FINDINGS: Prominent hallux valgus deformity. Chronic arthritic changes of the first MTP joint. Soft tissue swelling lateral to the fifth MTP joint. Slight osteopenia at the fifth MTP joint without discrete bone destruction. Slight degenerative changes in the dorsum of the midfoot. IMPRESSION: No acute abnormalities. Degenerative changes as described. Chronic soft tissue swelling lateral to the fifth MTP joint. Electronically Signed   By: Lorriane Shire M.D.   On: 04/02/2018 16:20   Dg Foot 2 Views Right  Result Date: 04/02/2018 CLINICAL DATA:  Wound on the right foot. EXAM: RIGHT FOOT - 2 VIEW COMPARISON:  Radiographs dated 01/07/2018 and 12/19/2017 and MRI dated 01/08/2018 and CT scan dated 06/08/2015 FINDINGS: There is a soft tissue ulceration on the plantar aspect of the ball of the foot the level of the first MTP joint. No acute bone abnormality. Chronic deformity at the fifth MTP joint. Chronic deformity in the midfoot from prior midfoot fractures. Those changes are stable. IMPRESSION: No acute bone abnormality. No radiographic evidence suggestive of osteomyelitis. Soft tissue abscess on the ball of the foot as described. Electronically Signed   By: Lorriane Shire M.D.   On: 04/02/2018 16:02     STUDIES:  CT head 7/8 > Bilateral occipital lobe/PCA territory acute infarcts versus PRES. RIGHT occipital lobe petechial hemorrhage. Small potentially acute LEFT MCA territory infarct or PRES. 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 Moderate  parenchymal brain volume loss. Moderate chronic small vessel ischemic changes. MRI head 7/8 > T2 hyperintense signal abnormality involving cortex and subcortical white matter in a posterior distribution. Scattered cortical reduced diffusion. Findings are consistent with PRES and atypical for ischemia. Xray R foot 7/8 > No acute bone abnormality. No radiographic evidence suggestive of Osteomyelitis. Soft tissue abscess. Xray R foot 7/8 > No acute abnormalities. Degenerative changes as described. Chronic soft tissue swelling lateral to the fifth MTP joint.  CULTURES: Blood 7/8 > Urine 7/8 >  ANTIBIOTICS: Aztreonam 7/8 >dc Vancomycin 7/8 > Levofloxacin 7/8 >dc Cefepime 7/9>>  SIGNIFICANT EVENTS: 7/8 admit 7/9 ID consulted  LINES/TUBES:   DISCUSSION: 67 year old female admitted with altered mental status hypertension last seen normal 03/31/2018.  ASSESSMENT / PLAN:  PULMONARY A: OSA unclear if on CPAP  P:   Currently no acute distress O2 as needed  CARDIOVASCULAR A:  Atrial fibrillation on eliquis  Hypertension NSTEMI - likely demand  P:  ICU monitoring Cleviprex drip Amiodarone drip with current nsr Started on heparin drip per electronic MD for elevated troponin She may need a cardiology consult although little else can be done at this time. Last 2 d 3/16 ef 55%  RENAL  Lab Results  Component Value Date  CREATININE 1.07 (H) 04/03/2018   CREATININE 1.08 (H) 04/03/2018   CREATININE 1.40 (H) 04/02/2018   CREATININE 0.92 04/21/2015   Recent Labs  Lab 04/02/18 1634 04/03/18 0007 04/03/18 0220  K 4.0 4.1 3.8    A:   AKI Met acidosis  P:   Follow chemistries Currently on bicarbonate drip with lactic acid improving now down to 1.7 on 04/03/2018 Continue to follow lactic acid  GASTROINTESTINAL A:   No acute issues  P:   Currently n.p.o. she may need NG tube and tube feedings if her mental status does not improve in the next 24 hours GI  prophylaxis  HEMATOLOGIC Lab Results  Component Value Date   INR 1.13 04/02/2018   INR 1.39 01/07/2018   INR 1.11 03/22/2017   Recent Labs    04/02/18 1634 04/03/18 0220  HGB 12.9 11.0*    A:   AC for AF  P:  Currently on heparin drip Transfuse per protocol   INFECTIOUS A:   SIRS,  Potential septic sources include feet, urine.   P:   Culture Empiric antibiotics She has been followed by infectious disease in the past will consult infectious disease on 04/03/2018  ENDOCRINE CBG (last 3)  Recent Labs    04/02/18 2251 04/03/18 0543  GLUCAP 100* 108*    A:   No acute issues  P:   Sliding scale insulin protocol  NEUROLOGIC A:   PRES Essential tremor - treated with parkinsons medications.   P:   Blood pressure control with Cleviprex Neurology is following Unable to do MRI due to motion artifact   FAMILY  - Updates: Sister Joaquim Lai updated at length. Requests full code.  04/03/2018 no family at bedside patient unable to interact verbally.  - Inter-disciplinary family meet or Palliative Care meeting due by:  7/14   App cct 40 min  Richardson Landry Relda Agosto ACNP Maryanna Shape PCCM Pager [phone] till 1 pm If no answer page [phone] 04/03/2018, 9:07 AM

## 2018-04-03 NOTE — Consult Note (Signed)
WOC Nurse wound consult note [facility]  No family present.  Patient non-verbal during our interaction Reason for Consult: Bilateral feet wound Wound type: Neuropathic POA: Yes/No/NA Measurement: Multiple discolored areas are present to the right foot. The right great toe, dorsal surface is discolored purple and measures 2.7cm x 2 cm. At the lateral first metatarsal head there is purple discoloration that measures 3 cm x 3.5 cm and has drops of brownish drainage oozing from it.  The toe is slightly erythematous and edematous.  The first joint of the second toe is discolored purple and it measures 1.5 cm x 0.8 cm.  On the lateral heel there is an open wound that measures 3 cm x 2 cm.  Along the posterior to midfoot there is a purple discolored area that measures 2 cm x 7 cm.   It appears that a portion of the injuries to the right foot may be due to the patient crossing her right foot over to the left foot and rubbing it along the rough cast that is on the left foot. Wound bed: The only area of erythema present was to the great toe. Orders have been entered for betadine to all wounds on the right foot, allow to air dry, wrap in kerlex each shift.  And, for a prevalon boot to the right foot while the patient is in bed. Monitor the wound area(s) for worsening of condition such as: Signs/symptoms of infection,  Increase in size,  Development of or worsening of odor, Development of pain, or increased pain at the affected locations.  Notify the medical team if any of these develop.  Thank you for the consult.  Discussed plan of care with the patient and bedside nurse.  WOC nurse will not follow at this time.  Please re-consult the WOC team if needed.  Helmut MusterSherry Laurabelle Gorczyca, RN, MSN, CWOCN, CNS-BC, pager [phone]

## 2018-04-03 NOTE — Progress Notes (Signed)
ANTICOAGULATION CONSULT NOTE   Pharmacy Consult for Heparin Indication: atrial fibrillation (while Apixaban on hold)  Allergies  Allergen Reactions  . Zolpidem Tartrate Other (See Comments)    Delusions, hallucinations  . Amoxicillin Rash and Other (See Comments)    Has patient had a PCN reaction causing immediate rash, facial/tongue/throat swelling, SOB or lightheadedness with hypotension: Yes Has patient had a PCN reaction causing severe rash involving mucus membranes or skin necrosis: No Has patient had a PCN reaction that required hospitalization: No Has patient had a PCN reaction occurring within the last 10 years: Yes If all of the above answers are "NO", then may proceed with Cephalosporin use.     Patient Measurements: Weight: 221 lb 1.9 oz (100.3 kg)  Wt: 108 kg Ht: 5'5" IBW: 57 kg Heparin Dosing Weight: 82 kg  Vital Signs: Temp: 98.3 F (36.8 C) (07/09 0315) Temp Source: Oral (07/09 0315) BP: 124/69 (07/09 0645) Pulse Rate: 76 (07/09 0645)  Labs: Recent Labs    04/02/18 1634 04/02/18 2206 04/03/18 0007 04/03/18 0220 04/03/18 0543  HGB 12.9  --   --  11.0*  --   HCT 43.4  --   --  36.6  --   PLT 530*  --   --  469*  --   APTT  --  34  --   --  56*  LABPROT  --  14.4  --   --   --   INR  --  1.13  --   --   --   HEPARINUNFRC  --   --  0.66  --  0.77*  CREATININE 1.40*  --  1.08* 1.07*  --   CKTOTAL 219  --   --   --   --   TROPONINI  --   --  2.15* 1.84*  --     Estimated Creatinine Clearance: 60.7 mL/min (A) (by C-G formula based on SCr of 1.07 mg/dL (H)).   Medical History: Past Medical History:  Diagnosis Date  . Anxiety   . 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  PT ALSO HAS LOWER BACK PAIN AT TIMES  . Depression   . GERD (gastroesophageal reflux disease)    RARE- OTC ANTACID IF NEEDED--PAST HX OF ULCER  . Headache    wakes up with headache   . Hyperlipidemia   . Hypertension   . Kidney stones    PT PASSED STONES--NO KNOWN  STONES AT PRESENT TIMES  . Lumbago 10/02/2013  . Obesity   . OSA (obstructive sleep apnea) 12/30/2013   Very mild OSA with REM accentuation,  [facility] Dr Frances FurbishAthar .   Marland Kitchen. Periodic limb movement disorder (PLMD) 12/30/2013  . Restless leg syndrome   . Ulcer   . Urinary incontinence     Assessment: 66 YOF on Apixaban PTA for hx Afib - admitted with acute encephalopathy thought to be due to PRES and unable to take po medications. Pharmacy consulted to start Heparin for anticoagulation while Apixaban on hold.   Last dose of Apixaban unknown - patient was found down.  7/9 AM update: aPTT is sub-therapeutic, using aPTT to guide dosing for now given apixaban influence on heparin levels, no issues per RN.  Goal of Therapy:  APTT 66-102 secs Heparin level 0.3-0.7 units/ml Monitor platelets by anticoagulation protocol: Yes   Plan:  Inc heparin to 1400 units/hr 1500 aPTT/HL  Abran DukeJames Ahriana Gunkel, PharmD, BCPS Clinical Pharmacist Phone: [phone]

## 2018-04-03 NOTE — Consult Note (Signed)
[facility]    Date of Admission:  04/02/2018           Day 2 vancomycin        Day 2 cefepime       Reason for Consult: Possible foot infection    Referring Provider: Devra Dopp, NP  Assessment: I suspect that the new dark areas on her right foot pressure sores from rubbing her foot against her cast on her left foot.  I am not sure that any of these are acutely infected.  Would probably be best to have the cast removed on her left foot for more complete examination.  I did not see any evidence of urinary tract infection or pneumonia.  Plan: 1. Continue empiric antibiotics for now 2. Ortho/Ortho tech consultation  Active Problems:   PRES (posterior reversible encephalopathy syndrome)   Pressure injury of skin   Scheduled Meds: . aspirin  150 mg Rectal Daily  . insulin aspart  1-3 Units Subcutaneous Q6H  . mouth rinse  15 mL Mouth Rinse BID  . metoprolol tartrate  2.5 mg Intravenous Q6H   Continuous Infusions: . sodium chloride 10 mL/hr at 04/03/18 0900  . amiodarone 30 mg/hr (04/03/18 1120)  . ceFEPime (MAXIPIME) IV 1 g (04/03/18 1028)  . clevidipine 2 mg/hr (04/03/18 1000)  . famotidine (PEPCID) IV Stopped (04/03/18 1610)  . heparin 1,400 Units/hr (04/03/18 1000)  .  sodium bicarbonate (isotonic) infusion in sterile water 125 mL/hr at 04/03/18 1000  . vancomycin Stopped (04/03/18 0704)   PRN Meds:.sodium chloride, acetaminophen, ondansetron (ZOFRAN) IV  HPI: [name] is a 67 y.o. female who was found down on the floor at home yesterday.  She was encephalopathic and covered in urine and feces.  She was brought to the emergency department where she was afebrile.  MRI of her brain suggested possible PRES. she has a history of diabetes, peripheral vascular disease and peripheral neuropathy.  She has been followed by my partner, Dr. Ninetta Lights, for bilateral foot infections recently.  When he last saw her on 03/21/2018 he noted that she  had a single clean ulcer on both feet without evidence of active infection.  Since that time she has had her left foot casted for unknown reasons.  EMS commented that they felt like there was odor coming from her right foot.  She was started on empiric broad-spectrum antibiotics.  Plain films of both feet shows no acute changes.  There was no evidence of osteomyelitis.  Chest x-ray did not show any evidence of pneumonia.  She had no pyuria on urinalysis.  She remains afebrile.   Review of Systems: Review of Systems  Unable to perform ROS: Mental acuity    Past Medical History:  Diagnosis Date  . Anxiety   . 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  PT ALSO HAS LOWER BACK PAIN AT TIMES  . Depression   . GERD (gastroesophageal reflux disease)    RARE- OTC ANTACID IF NEEDED--PAST HX OF ULCER  . Headache    wakes up with headache   . Hyperlipidemia   . Hypertension   . Kidney stones    PT PASSED STONES--NO KNOWN STONES AT PRESENT TIMES  . Lumbago 10/02/2013  . Obesity   . OSA (obstructive sleep apnea) 12/30/2013   Very mild OSA with REM accentuation,  tested 11-29-13 at [facility] sleep, Dr Frances Furbish .   Marland Kitchen Periodic limb movement  disorder (PLMD) 12/30/2013  . Restless leg syndrome   . Ulcer   . Urinary incontinence     Social History   Tobacco Use  . Smoking status: Former Smoker    Packs/day: 2.00    Years: 20.00    Pack years: 40.00    Types: Cigarettes    Last attempt to quit: 10/17/1988    Years since quitting: 29.4  . Smokeless tobacco: Never Used  Substance Use Topics  . Alcohol use: No  . Drug use: No    Comment: Marijuana - 25 years ago    Family History  Problem Relation Age of Onset  . Sleep apnea Father   . Hypertension Father   . Hypertension Mother   . Multiple sclerosis Sister   . Multiple sclerosis Sister    Allergies  Allergen Reactions  . Zolpidem Tartrate Other (See Comments)    Delusions, hallucinations  . Amoxicillin Rash and Other (See Comments)     Has patient had a PCN reaction causing immediate rash, facial/tongue/throat swelling, SOB or lightheadedness with hypotension: Yes Has patient had a PCN reaction causing severe rash involving mucus membranes or skin necrosis: No Has patient had a PCN reaction that required hospitalization: No Has patient had a PCN reaction occurring within the last 10 years: Yes If all of the above answers are "NO", then may proceed with Cephalosporin use.     OBJECTIVE: Blood pressure (!) 116/91, pulse 80, temperature 99.1 F (37.3 C), temperature source Axillary, resp. rate 17, weight 221 lb 1.9 oz (100.3 kg), SpO2 97 %.  Physical Exam  Constitutional:  Her eyes are open with dilated pupils.  She does not respond to questions.  HENT:  She appears to have some crusted blood around her lips.  Neck:  She has diffuse muscle rigidity.  Cardiovascular: Normal rate, regular rhythm and normal heart sounds.  No murmur heard. Pulmonary/Chest: Effort normal and breath sounds normal.  Abdominal: Soft. She exhibits no distension and no mass.  Musculoskeletal:  Healed incisions from bilateral knee arthroplasties.  Skin:  Scattered ecchymoses and abrasions on her extremities.  New, dark pressure sores on her right foot as noted in pictures below.  Healing ulcer under the first metatarsal head.  There is strong odor of urine coming from her casted left foot.            Lab Results Lab Results  Component Value Date   WBC 12.5 (H) 04/03/2018   HGB 11.0 (L) 04/03/2018   HCT 36.6 04/03/2018   MCV 83.4 04/03/2018   PLT 469 (H) 04/03/2018    Lab Results  Component Value Date   CREATININE 1.07 (H) 04/03/2018   BUN 28 (H) 04/03/2018   NA 142 04/03/2018   K 3.8 04/03/2018   CL 109 04/03/2018   CO2 22 04/03/2018    Lab Results  Component Value Date   ALT 42 04/02/2018   AST 74 (H) 04/02/2018   ALKPHOS 138 (H) 04/02/2018   BILITOT 0.9 04/02/2018     Microbiology: Recent Results (from the  past 240 hour(s))  MRSA PCR Screening     Status: None   Collection Time: 04/02/18 11:02 PM  Result Value Ref Range Status   MRSA by PCR NEGATIVE NEGATIVE Final    Comment:        The GeneXpert MRSA Assay (FDA approved for NASAL specimens only), is one component of a comprehensive MRSA colonization surveillance program. It is not intended to diagnose MRSA infection nor to guide  or monitor treatment for MRSA infections. Performed at [facility], [address] [place], [place] [address]     Cliffton AstersJohn Kostantinos Tallman, MD [facility] [phone] pager   [phone] cell 04/03/2018, 11:22 AM

## 2018-04-03 NOTE — Progress Notes (Signed)
eLink Physician-Brief Progress Note Patient Name: Yvette ShutterCarolyn I Lahm DOB: [birth year] MRN: [id number]   Date of Service  04/03/2018  HPI/Events of Note  Troponin # 1 = 2.15. Clinical picture c/w NSTEMI. Already on a Heparin IV infusion.   eICU Interventions  Will order: 1. ASA 150 mg suppository 150 mg PR now and Q day. 2. Metoprolol 2.5 mg IV Q 6 hours. Hold for SBP < 110 or HR < 60.     Intervention Category Intermediate Interventions: Diagnostic test evaluation  Sommer,Steven Eugene 04/03/2018, 1:31 AM

## 2018-04-03 NOTE — Progress Notes (Signed)
EEG complete - results pending 

## 2018-04-03 NOTE — Procedures (Signed)
ELECTROENCEPHALOGRAM REPORT  Date of Study: 04/03/2018  Patient's Name: [name] MRN: [id number] Date of Birth: 17-Feb-1951  Referring Provider: Gigi GinKathleen H Hammonds, MD  Clinical History: 67 year old woman with history of remote left parietal infarct admitted for PRES, presenting aphasic.  Medications: Not on sedation amiodarone  Technical Summary: A multichannel digital EEG recording measured by the international 10-20 system with electrodes applied with paste and impedances below 5000 ohms performed as portable with EKG monitoring in an awake but altered patient.  Hyperventilation and photic stimulation were not performed.  The digital EEG was referentially recorded, reformatted, and digitally filtered in a variety of bipolar and referential montages for optimal display.   Description: The patient is awake but altered during the recording.  During maximal wakefulness, there is a symmetric, medium voltage 5 Hz posterior dominant rhythm that attenuates with eye opening. This is admixed with diffuse 5-6 Hz theta (some sharply contoured) and mild 2-3 Hz delta slowing of the waking background.  Stage 2 sleep is not seen.  During the study, she exhibited right sided tremor or extremity movements which demonstrated muscle artifact but there were no epileptiform discharges or electrographic seizures seen.    EKG lead was unremarkable.  Impression: This awake EEG is abnormal due to diffuse slowing of the waking background.  Clinical Correlation of the above findings indicates diffuse cerebral dysfunction that is non-specific in etiology and can be seen with hypoxic/ischemic injury, toxic/metabolic encephalopathies, neurodegenerative disorders, or medication effect.  The absence of epileptiform discharges does not rule out a clinical diagnosis of epilepsy.  Clinical correlation is advised.   Shon MilletAdam Krystal Delduca, DO

## 2018-04-04 ENCOUNTER — Inpatient Hospital Stay (HOSPITAL_COMMUNITY): Payer: PPO

## 2018-04-04 DIAGNOSIS — L84 Corns and callosities: Secondary | ICD-10-CM

## 2018-04-04 DIAGNOSIS — Z888 Allergy status to other drugs, medicaments and biological substances status: Secondary | ICD-10-CM

## 2018-04-04 LAB — GLUCOSE, CAPILLARY
GLUCOSE-CAPILLARY: 108 mg/dL — AB (ref 70–99)
Glucose-Capillary: 95 mg/dL (ref 70–99)
Glucose-Capillary: 90 mg/dL (ref 70–99)
Glucose-Capillary: 110 mg/dL — ABNORMAL HIGH (ref 70–99)
Glucose-Capillary: 93 mg/dL (ref 70–99)

## 2018-04-04 LAB — BASIC METABOLIC PANEL
ANION GAP: 14 (ref 5–15)
BUN: 14 mg/dL (ref 8–23)
CALCIUM: 8.3 mg/dL — AB (ref 8.9–10.3)
CO2: 26 mmol/L (ref 22–32)
Chloride: 96 mmol/L — ABNORMAL LOW (ref 98–111)
Creatinine, Ser: 0.96 mg/dL (ref 0.44–1.00)
Glucose, Bld: 109 mg/dL — ABNORMAL HIGH (ref 70–99)
Potassium: 3.2 mmol/L — ABNORMAL LOW (ref 3.5–5.1)
Sodium: 136 mmol/L (ref 135–145)

## 2018-04-04 LAB — CBC WITH DIFFERENTIAL/PLATELET
Abs Immature Granulocytes: 0.1 10*3/uL (ref 0.0–0.1)
Basophils Absolute: 0 10*3/uL (ref 0.0–0.1)
Basophils Relative: 0 %
EOS ABS: 0 10*3/uL (ref 0.0–0.7)
Eosinophils Relative: 0 %
HCT: 35.8 % — ABNORMAL LOW (ref 36.0–46.0)
Hemoglobin: 10.8 g/dL — ABNORMAL LOW (ref 12.0–15.0)
IMMATURE GRANULOCYTES: 1 %
LYMPHS PCT: 15 %
Lymphs Abs: 1.3 10*3/uL (ref 0.7–4.0)
MCH: 25.2 pg — ABNORMAL LOW (ref 26.0–34.0)
MCHC: 30.2 g/dL (ref 30.0–36.0)
MCV: 83.4 fL (ref 78.0–100.0)
Monocytes Absolute: 0.8 10*3/uL (ref 0.1–1.0)
Monocytes Relative: 10 %
NEUTROS ABS: 6.5 10*3/uL (ref 1.7–7.7)
NEUTROS PCT: 74 %
Platelets: 340 10*3/uL (ref 150–400)
RBC: 4.29 MIL/uL (ref 3.87–5.11)
RDW: 18.2 % — AB (ref 11.5–15.5)
WBC: 8.8 10*3/uL (ref 4.0–10.5)

## 2018-04-04 LAB — LEGIONELLA PNEUMOPHILA SEROGP 1 UR AG: L. PNEUMOPHILA SEROGP 1 UR AG: NEGATIVE

## 2018-04-04 LAB — PHOSPHORUS: PHOSPHORUS: 2.3 mg/dL — AB (ref 2.5–4.6)

## 2018-04-04 LAB — MAGNESIUM: Magnesium: 1.6 mg/dL — ABNORMAL LOW (ref 1.7–2.4)

## 2018-04-04 MED ORDER — GADOBENATE DIMEGLUMINE 529 MG/ML IV SOLN
18.0000 mL | Freq: Once | INTRAVENOUS | Status: AC | PRN
  Administered 2018-04-04: 18 mL via INTRAVENOUS

## 2018-04-04 MED ORDER — MAGNESIUM SULFATE 2 GM/50ML IV SOLN
2.0000 g | Freq: Once | INTRAVENOUS | Status: AC
  Administered 2018-04-04: 2 g via INTRAVENOUS
  Filled 2018-04-04: qty 50

## 2018-04-04 MED ORDER — POTASSIUM PHOSPHATES 15 MMOLE/5ML IV SOLN
20.0000 mmol | Freq: Once | INTRAVENOUS | Status: AC
  Administered 2018-04-04: 20 mmol via INTRAVENOUS
  Filled 2018-04-04: qty 6.67

## 2018-04-04 MED ORDER — HYDRALAZINE HCL 20 MG/ML IJ SOLN
10.0000 mg | INTRAMUSCULAR | Status: DC | PRN
  Administered 2018-04-04 – 2018-04-05 (×2): 20 mg via INTRAVENOUS
  Filled 2018-04-04 (×2): qty 2

## 2018-04-04 MED ORDER — CHLORHEXIDINE GLUCONATE 0.12 % MT SOLN
15.0000 mL | Freq: Two times a day (BID) | OROMUCOSAL | Status: DC
  Administered 2018-04-04 – 2018-04-05 (×4): 15 mL via OROMUCOSAL

## 2018-04-04 MED ORDER — CLEVIDIPINE BUTYRATE 0.5 MG/ML IV EMUL
0.0000 mg/h | INTRAVENOUS | Status: DC
Start: 2018-04-04 — End: 2018-04-05
  Administered 2018-04-04: 2 mg/h via INTRAVENOUS

## 2018-04-04 MED ORDER — LORAZEPAM 2 MG/ML IJ SOLN
2.0000 mg | Freq: Once | INTRAMUSCULAR | Status: AC
  Administered 2018-04-04: 2 mg via INTRAVENOUS
  Filled 2018-04-04: qty 1

## 2018-04-04 NOTE — Progress Notes (Signed)
Patient ID: [name], female   DOB: May 01, 1951, 67 y.o.   MRN: [id number]         [facility]  Date of Admission:  04/02/2018           Day 3 vancomycin        Day 3 cefepime ASSESSMENT: She has both chronic and acute pressure injuries of her feet but I am not absolutely certain that there is any active infection contributing to her acute decline in mental status.  I suggest continuing empiric antibiotic therapy and asking for a formal orthopedic evaluation.  PLAN: 1. Continue current antibiotics 2. Recommend orthopedic evaluation  Active Problems:   PRES (posterior reversible encephalopathy syndrome)   Pressure injury of skin   Scheduled Meds: . aspirin  150 mg Rectal Daily  . chlorhexidine  15 mL Mouth Rinse BID  . insulin aspart  1-3 Units Subcutaneous Q6H  . mouth rinse  15 mL Mouth Rinse BID  . metoprolol tartrate  2.5 mg Intravenous Q6H   Continuous Infusions: . sodium chloride 10 mL/hr at 04/03/18 1600  . amiodarone 30 mg/hr (04/04/18 0017)  . ceFEPime (MAXIPIME) IV 1 g (04/04/18 1100)  . clevidipine 2 mg/hr (04/04/18 1054)  . famotidine (PEPCID) IV 20 mg (04/04/18 1012)  . potassium PHOSPHATE IVPB (in mmol) 20 mmol (04/04/18 0940)  . vancomycin 150 mL/hr at 04/04/18 0545   PRN Meds:.sodium chloride, acetaminophen, hydrALAZINE, ondansetron (ZOFRAN) IV  Review of Systems: Review of Systems  Unable to perform ROS: Mental acuity    Allergies  Allergen Reactions  . Zolpidem Tartrate Other (See Comments)    Delusions, hallucinations  . Amoxicillin Rash and Other (See Comments)    Has patient had a PCN reaction causing immediate rash, facial/tongue/throat swelling, SOB or lightheadedness with hypotension: Yes Has patient had a PCN reaction causing severe rash involving mucus membranes or skin necrosis: No Has patient had a PCN reaction that required hospitalization: No Has patient had a PCN reaction occurring within the last 10  years: Yes If all of the above answers are "NO", then may proceed with Cephalosporin use.     OBJECTIVE: Vitals:   04/04/18 0722 04/04/18 0800 04/04/18 0900 04/04/18 1000  BP:  122/80  (!) 81/70  Pulse:   (!) 139   Resp:  18 (!) 26 18  Temp: 98 F (36.7 C)     TempSrc: Oral     SpO2:   90%   Weight:       Body mass index is 36.94 kg/m.  Physical Exam  Constitutional:  She remains awake but unresponsive.  Musculoskeletal:  Her right foot pressure sores remain unchanged.  There is no obvious infection of her right foot.  The cast has been removed from her left foot.  She has a callus with superficial ulcer beneath her left first metatarsal head.  There is no drainage or odor.  There appears to be a hematoma distal to the callus that is fluctuant.  She has an ulcer over the lateral aspect of her foot.  There is a small amount of odor but again, no definite infection.    Lab Results Lab Results  Component Value Date   WBC 8.8 04/04/2018   HGB 10.8 (L) 04/04/2018   HCT 35.8 (L) 04/04/2018   MCV 83.4 04/04/2018   PLT 340 04/04/2018    Lab Results  Component Value Date   CREATININE 0.96 04/04/2018   BUN 14 04/04/2018   NA 136  04/04/2018   K 3.2 (L) 04/04/2018   CL 96 (L) 04/04/2018   CO2 26 04/04/2018    Lab Results  Component Value Date   ALT 42 04/02/2018   AST 74 (H) 04/02/2018   ALKPHOS 138 (H) 04/02/2018   BILITOT 0.9 04/02/2018     Microbiology: Recent Results (from the past 240 hour(s))  Blood Culture (routine x 2)     Status: None (Preliminary result)   Collection Time: 04/02/18  4:17 PM  Result Value Ref Range Status   Specimen Description BLOOD RIGHT ANTECUBITAL  Final   Special Requests   Final    BOTTLES DRAWN AEROBIC ONLY Blood Culture results may not be optimal due to an inadequate volume of blood received in culture bottles   Culture   Final    NO GROWTH < 24 HOURS Performed at [facility], [address] [place], [place] [address]     Report Status PENDING  Incomplete  Culture, blood (Routine X 2) w Reflex to ID Panel     Status: None (Preliminary result)   Collection Time: 04/02/18  8:21 PM  Result Value Ref Range Status   Specimen Description BLOOD LEFT HAND  Final   Special Requests   Final    BOTTLES DRAWN AEROBIC AND ANAEROBIC Blood Culture results may not be optimal due to an inadequate volume of blood received in culture bottles   Culture   Final    NO GROWTH < 24 HOURS Performed at [facility], [address] [place], [place] [address]    Report Status PENDING  Incomplete  Urine culture     Status: Abnormal (Preliminary result)   Collection Time: 04/02/18  8:57 PM  Result Value Ref Range Status   Specimen Description URINE, CATHETERIZED  Final   Special Requests   Final    NONE Performed at [facility], [address] [place], [place] [address]    Culture >=100,000 COLONIES/mL ESCHERICHIA COLI (A)  Final   Report Status PENDING  Incomplete  MRSA PCR Screening     Status: None   Collection Time: 04/02/18 11:02 PM  Result Value Ref Range Status   MRSA by PCR NEGATIVE NEGATIVE Final    Comment:        The GeneXpert MRSA Assay (FDA approved for NASAL specimens only), is one component of a comprehensive MRSA colonization surveillance program. It is not intended to diagnose MRSA infection nor to guide or monitor treatment for MRSA infections. Performed at [facility], [address] [place], [place] [address]     Cliffton AstersJohn Dayshaun Whobrey, MD [facility] [phone] pager   [phone] cell 04/04/2018, 11:23 AM

## 2018-04-04 NOTE — Progress Notes (Signed)
Orthopedic Tech Progress Note Patient Details:  Yvette ShutterCarolyn I Orozco 1951/08/23 [id number]  Casting Type of Cast: Short leg cast Cast Location: lle Cast Material: Fiberglass Cast Intervention: Removal  Post Interventions Patient Tolerated: Well Instructions Provided: Care of device  Viewed order from doctor's order list   [name], [name] 04/04/2018, 10:06 AM

## 2018-04-04 NOTE — Progress Notes (Signed)
Pt returned from the MRI. Alert. Family at the bedside. Nodded appropriately. VS stable  Bed in low position, alarms are on, call bell in reach, continue to monitor

## 2018-04-04 NOTE — Plan of Care (Signed)
Pt remains alert, can look at you. Did nod to the family once, approprietly. Does not follow commands at this time. Continue to monitor

## 2018-04-04 NOTE — Progress Notes (Signed)
Subjective: Awake, but appears aphasic  Exam: Vitals:   04/04/18 0700 04/04/18 0722  BP: (!) 143/91   Pulse:    Resp: 17   Temp:  98 F (36.7 C)  SpO2:     Gen: In bed, NAD Resp: non-labored breathing, no acute distress Abd: soft, nt  Neuro: MS: Awake, fixates and tracks, exaggerated startle reflex. She does not follow commands or verbalize.  EA:[id number]:blinks to threat bilaterally Motor: localizes x 4, increased tone with tremor throughout.  Sensory:localizes to nox stim x 4.   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 She seems aphasic and I wonder if her old left parietal infarct is playing some role in this(though she is able to talk at baseline). Her imaging was suboptimal, but the diffusion sequence was enough to see that there is clearly much more T2 change than DWI change which is more consistent with PRES than strkoe.   Her exam is odd, she appears almost catatonic. No clear seizures on EEG, and her aphasia could be PRES coupled with her previous lesion. I do think that repeat imaging would be helpful and will try this with ativan today.   Recommendations: 1) Repeat MRI today w/wo contrast.  2) continue BP control, goal normotension 3) ativan prior to MRI.  Ritta SlotMcNeill Kirkpatrick, MD [facility] [phone]  If 7pm- 7am, please page neurology on call as listed in AMION.

## 2018-04-04 NOTE — Progress Notes (Signed)
Scanned Limited MRI of Brain with and without contrast and MRA of head.  Pt unable to tolerate MRI of feet due to moving.  Brain was most important per RN, Boyd KerbsPenny.  If MRI of feet still needed pt can come back in 24 to 48 hours (once contrast has had time to wash out) and attempt.

## 2018-04-04 NOTE — Progress Notes (Signed)
Neuro: Upon assessment in Am, pt alert, can look at you but unable to follow commands. Nonverbal, but responds to pain stimuli by hitting and pushing back.. Pupils are equal and reactive.  Skin:  After cast was removed by Ortho tech. Open ulcers, blister filled with blood was noted to the Lt foot. Rt foot has several ulcers too, some are open as well. Lt Bottock is documented as a pressure injury. Needs WOC RN evaluation and right documentation. Per report pt had a "bump", which no is open.  Brother at the bedside, updated by MD,  considerably a long time.   Pt was taken to MRI at this time. VS are stable at this time.

## 2018-04-04 NOTE — Progress Notes (Addendum)
PULMONARY / CRITICAL CARE MEDICINE   Name: [name] MRN: [id number] DOB: 09-09-1951    ADMISSION DATE:  04/02/2018 CONSULTATION DATE:  7/8  REFERRING MD:  Dr. Laverta Baltimore  CHIEF COMPLAINT:  AMS -found down  HISTORY OF PRESENT ILLNESS:   67 year old female with PMH as below, which is significant for Atrial fib on Eliquis, OSA, and neuropathy. Her recent medical course has been complicated by ongoing foot wounds/ulcers. She was admitted for this back in April of this year at what time she was found to have and abscess on the R foot and underwent surgical I&D. Cultures were negative. She was treated with an extended course of IV antibiotics, Then off and on oral antibiotics. She has been followed by [name] in the ID clinic. Also followed by [name] with [facility] who felt nothing to do surgically in May.  Most recently she was started on Doxycyline 6/21 at ortho clinic as she had continued discharge. ID felt she may need further MRI.   Then 7/8 she was found down by home health aide. She was last known well Friday (7/5) afternoon. In the ED she was alert, but only intermittently answering yes/no questions. Very altered. Sent for CT scan which was concerning for infarct vs PRES. Her BP was borderline high, but diastolic notably high. She was started on cleviprex infusion. She had MRI, which was more consistent with PRES. PCCM asked to admit.     SUBJECTIVE:  67 year old female who remains noncommunicative, plan is for have an MRI of the head today after receiving Ativan.  Unknown who placed cast on her left leg at this time.  Unable to find any documentations will ask orthopedic tech to come cast cast off.  VITAL SIGNS: BP (!) 143/91   Pulse 67   Temp 98 F (36.7 C) (Oral)   Resp 17   Wt 222 lb 0.1 oz (100.7 kg)   LMP  (LMP Unknown)   SpO2 94%   BMI 36.94 kg/m   HEMODYNAMICS:    VENTILATOR SETTINGS:    INTAKE / OUTPUT: I/O last 3 completed shifts: In: 32  [I.V.:3696.5; IV Piggyback:794.6] Out: 2740 [Urine:2740]  PHYSICAL EXAMINATION: General: Disheveled female who is currently hemodynamically stable HEENT: No JVD or lymphadenopathy appreciated, does not follow commands, Neuro: Left-sided hemiparesis noted, startle reflex present, moves all extremities spontaneously. CV: Heart sounds are regular, remains on amiodarone and Cleviprex drip PULM: Decreased breath sounds in the bases EZ:MOQH, non-tender, bsx4 active  Extremities: Right foot with dressing multiple areas of abrasion and skin down breakdown noted, left foot hard cast unknown who placed a cast. Skin: no rashes or lesions    LABS:  BMET Recent Labs  Lab 04/02/18 1634 04/03/18 0007 04/03/18 0220  NA 141 141 142  K 4.0 4.1 3.8  CL 106 113* 109  CO2 18* 20* 22  BUN 37* 32* 28*  CREATININE 1.40* 1.08* 1.07*  GLUCOSE 135* 110* 104*    Electrolytes Recent Labs  Lab 04/02/18 1634 04/03/18 0007 04/03/18 0220  CALCIUM 9.3 8.2* 8.3*  MG  --  1.7 1.7  PHOS  --   --  2.2*    CBC Recent Labs  Lab 04/02/18 1634 04/03/18 0220 04/04/18 0734  WBC 13.5* 12.5* 8.8  HGB 12.9 11.0* 10.8*  HCT 43.4 36.6 35.8*  PLT 530* 469* 340    Coag's Recent Labs  Lab 04/02/18 2206 04/03/18 0543 04/03/18 1521  APTT 34 56* 54*  INR 1.13  --   --  Sepsis Markers Recent Labs  Lab 04/02/18 2206 04/02/18 2215 04/03/18 0218 04/03/18 0543  LATICACIDVEN  --  2.87* 2.1* 1.7  PROCALCITON 0.12  --   --   --     ABG Recent Labs  Lab 04/03/18 1020  PHART 7.535*  PCO2ART 31.8*  PO2ART 67.7*    Liver Enzymes Recent Labs  Lab 04/02/18 1634  AST 74*  ALT 42  ALKPHOS 138*  BILITOT 0.9  ALBUMIN 2.9*    Cardiac Enzymes Recent Labs  Lab 04/03/18 0007 04/03/18 0220 04/03/18 1049  TROPONINI 2.15* 1.84* 1.11*    Glucose Recent Labs  Lab 04/02/18 2251 04/03/18 0543 04/03/18 1130 04/03/18 1715 04/03/18 2309 04/04/18 0551  GLUCAP 100* 108* 90 98 94 95     Imaging Dg Chest Port 1 View  Result Date: 04/04/2018 CLINICAL DATA:  Respiratory failure with shortness of breath EXAM: PORTABLE CHEST 1 VIEW COMPARISON:  April 02, 2018 FINDINGS: There is mild bibasilar atelectasis. There is no edema or consolidation. The heart size and pulmonary vascularity are normal. No adenopathy. No bone lesions. IMPRESSION: Mild bibasilar atelectasis. Lungs elsewhere clear. Stable cardiac silhouette. Electronically Signed   By: Lowella Grip III M.D.   On: 04/04/2018 07:09     STUDIES:  CT head 7/8 > Bilateral occipital lobe/PCA territory acute infarcts versus PRES. RIGHT occipital lobe petechial hemorrhage. Small potentially acute LEFT MCA territory infarct or PRES. 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 Moderate parenchymal brain volume loss. Moderate chronic small vessel ischemic changes. MRI head 7/8 > T2 hyperintense signal abnormality involving cortex and subcortical white matter in a posterior distribution. Scattered cortical reduced diffusion. Findings are consistent with PRES and atypical for ischemia. Xray R foot 7/8 > No acute bone abnormality. No radiographic evidence suggestive of Osteomyelitis. Soft tissue abscess. Xray R foot 7/8 > No acute abnormalities. Degenerative changes as described. Chronic soft tissue swelling lateral to the fifth MTP joint.  CULTURES: Blood 7/8 > Urine 7/8 > >100k gnr>>  ANTIBIOTICS: Aztreonam 7/8 >dc Vancomycin 7/8 > Levofloxacin 7/8 >dc Cefepime 7/9>>  SIGNIFICANT EVENTS: 7/8 admit 7/9 ID consulted 04/04/2018 plan for MRI of the brain>>  LINES/TUBES:   DISCUSSION: 67 year old female admitted with altered mental status hypertension last seen normal 03/31/2018.  Change in her mental status continue to be difficult to evaluate.  Therefore she will undergo an MRI today 04/04/2018 after being given Ativan to decrease her motion artifact.  ASSESSMENT / PLAN:  PULMONARY A: OSA  unclear if on CPAP  P:    No acute distress  CARDIOVASCULAR A:  Atrial fibrillation on eliquis  Hypertension NSTEMI - likely demand  P:  Continue ICU monitoring Currently on amiodarone drip with controlled ventricular rate transition amiodarone to p.o. once NG tube is been placed Anticoagulation is on hold Resume beta blocker, apresoline. May be able to stop amio in near future.   RENAL  Lab Results  Component Value Date   CREATININE 1.07 (H) 04/03/2018   CREATININE 1.08 (H) 04/03/2018   CREATININE 1.40 (H) 04/02/2018   CREATININE 0.92 04/21/2015   Recent Labs  Lab 04/02/18 1634 04/03/18 0007 04/03/18 0220  K 4.0 4.1 3.8    A:   AKI (improved) Met acidosis(resolved)  P:   Bicarbonate drip was discontinued Acidosis is corrected Continue to follow chemistries and creatinine Replete lytes 7/10   GASTROINTESTINAL A:   No acute issues  P:   04/04/2018 we will plan to insert NG tube if she is  unable to eat or take regular p.o. medications.  I do not see this changing in the near future.  HEMATOLOGIC Lab Results  Component Value Date   INR 1.13 04/02/2018   INR 1.39 01/07/2018   INR 1.11 03/22/2017   Recent Labs    04/03/18 0220 04/04/18 0734  HGB 11.0* 10.8*    A:   AC for AF  P:  Heparin drip is been discontinued No current anticoagulation or DVT prophylaxis   INFECTIOUS A:   SIRS,  Potential septic sources include feet, urine.   P:   Follow culture data ID consult with Dr. Megan Salon 04/03/2018  continue current antimicrobial therapy  ENDOCRINE CBG (last 3)  Recent Labs    04/03/18 1715 04/03/18 2309 04/04/18 0551  GLUCAP 98 94 95    A:   No acute issues  P:   Sliding scale insulin protocol  NEUROLOGIC A:   PRES Essential tremor - treated with parkinsons medications.     P:    will take off Cleviprex at this time if possible Long discussion with Dr. Saralyn Pilar of neurology concerning this way to obtain MRI without motion  artifact.  Plan is to give her Ativan prior to going to MRI this will solve to full problems we will also check for possible catatonia.    Musculoskeletal  A. Short leg cast in place left leg Right foot has been followed in the past by [name] of [facility].  Note spoke with Mechele Claude, PA on the [facility] 04/04/2018.  They were not involved in application of the left short cast left foot.  Stated that if needed they will be glad to come and see the patient.  P 04/04/2018 we will ask orthopedic tech to remove the left short cast on the left foot. Evaluate left foot and make appropriate recommendations for his orthopedic or wound care to follow.  Want to make sure she does not have a sequestered infection underneath the cast.   FAMILY  - Updates: Sister Joaquim Lai updated at length. Requests full code.  04/04/2018 no family at bedside.  - Inter-disciplinary family meet or Palliative Care meeting due by:  7/14   App cct 30 min  Richardson Landry Minor ACNP Maryanna Shape PCCM Pager [phone] till 1 pm If no answer page [phone] 04/04/2018, 8:20 AM

## 2018-04-04 NOTE — Consult Note (Signed)
WOC Nurse wound consult note I attempted to assess the patient for the current consult [facility]  Unfortunately, she was in MRI and according to the Unit Secretary, will likely be there for 2 hours.  If I am unable to evaluate her today, I plan to see her in the morning. Helmut MusterSherry Yer Castello, RN, MSN, CWOCN, CNS-BC, pager [phone]

## 2018-04-05 ENCOUNTER — Inpatient Hospital Stay (HOSPITAL_COMMUNITY): Payer: PPO

## 2018-04-05 DIAGNOSIS — L089 Local infection of the skin and subcutaneous tissue, unspecified: Secondary | ICD-10-CM

## 2018-04-05 DIAGNOSIS — I4891 Unspecified atrial fibrillation: Secondary | ICD-10-CM

## 2018-04-05 DIAGNOSIS — Z1612 Extended spectrum beta lactamase (ESBL) resistance: Secondary | ICD-10-CM

## 2018-04-05 DIAGNOSIS — N390 Urinary tract infection, site not specified: Secondary | ICD-10-CM

## 2018-04-05 DIAGNOSIS — A419 Sepsis, unspecified organism: Secondary | ICD-10-CM

## 2018-04-05 DIAGNOSIS — B9620 Unspecified Escherichia coli [E. coli] as the cause of diseases classified elsewhere: Secondary | ICD-10-CM

## 2018-04-05 LAB — MAGNESIUM: MAGNESIUM: 1.8 mg/dL (ref 1.7–2.4)

## 2018-04-05 LAB — GLUCOSE, CAPILLARY
GLUCOSE-CAPILLARY: 131 mg/dL — AB (ref 70–99)
Glucose-Capillary: 127 mg/dL — ABNORMAL HIGH (ref 70–99)
Glucose-Capillary: 115 mg/dL — ABNORMAL HIGH (ref 70–99)
Glucose-Capillary: 123 mg/dL — ABNORMAL HIGH (ref 70–99)

## 2018-04-05 LAB — PHOSPHORUS: PHOSPHORUS: 2.8 mg/dL (ref 2.5–4.6)

## 2018-04-05 LAB — BASIC METABOLIC PANEL
Anion gap: 13 (ref 5–15)
BUN: 13 mg/dL (ref 8–23)
CHLORIDE: 95 mmol/L — AB (ref 98–111)
CO2: 25 mmol/L (ref 22–32)
CREATININE: 0.94 mg/dL (ref 0.44–1.00)
Calcium: 8.3 mg/dL — ABNORMAL LOW (ref 8.9–10.3)
GFR calc Af Amer: >60 mL/min (ref >60)
GFR calc non Af Amer: >60 mL/min (ref >60)
GLUCOSE: 118 mg/dL — AB (ref 70–99)
POTASSIUM: 3.1 mmol/L — AB (ref 3.5–5.1)
SODIUM: 133 mmol/L — AB (ref 135–145)

## 2018-04-05 LAB — URINE CULTURE

## 2018-04-05 MED ORDER — VITAL HIGH PROTEIN PO LIQD
1000.0000 mL | ORAL | Status: DC
  Administered 2018-04-05: 1000 mL

## 2018-04-05 MED ORDER — ORAL CARE MOUTH RINSE
15.0000 mL | Freq: Two times a day (BID) | OROMUCOSAL | Status: DC
  Administered 2018-04-06 – 2018-04-13 (×13): 15 mL via OROMUCOSAL

## 2018-04-05 MED ORDER — POTASSIUM CHLORIDE 10 MEQ/100ML IV SOLN
10.0000 meq | INTRAVENOUS | Status: AC
  Administered 2018-04-05 (×3): 10 meq via INTRAVENOUS
  Filled 2018-04-05 (×3): qty 100

## 2018-04-05 MED ORDER — AMIODARONE HCL 200 MG PO TABS
200.0000 mg | ORAL_TABLET | Freq: Two times a day (BID) | ORAL | Status: DC
  Administered 2018-04-05 – 2018-04-14 (×16): 200 mg via ORAL
  Filled 2018-04-05 (×19): qty 1

## 2018-04-05 MED ORDER — CHLORHEXIDINE GLUCONATE 0.12 % MT SOLN
15.0000 mL | Freq: Two times a day (BID) | OROMUCOSAL | Status: DC
  Administered 2018-04-05 – 2018-04-14 (×16): 15 mL via OROMUCOSAL
  Filled 2018-04-05 (×15): qty 15

## 2018-04-05 MED ORDER — SODIUM CHLORIDE 0.9 % IV SOLN
1.0000 g | Freq: Three times a day (TID) | INTRAVENOUS | Status: DC
  Administered 2018-04-05 – 2018-04-09 (×13): 1 g via INTRAVENOUS
  Filled 2018-04-05 (×14): qty 1

## 2018-04-05 MED ORDER — CARVEDILOL 12.5 MG PO TABS
12.5000 mg | ORAL_TABLET | Freq: Two times a day (BID) | ORAL | Status: DC
  Administered 2018-04-05 – 2018-04-07 (×6): 12.5 mg via NASOGASTRIC
  Filled 2018-04-05 (×6): qty 1

## 2018-04-05 MED ORDER — CARVEDILOL 6.25 MG PO TABS
6.2500 mg | ORAL_TABLET | Freq: Two times a day (BID) | ORAL | Status: DC
  Filled 2018-04-05: qty 1

## 2018-04-05 MED ORDER — INSULIN ASPART 100 UNIT/ML ~~LOC~~ SOLN
1.0000 [IU] | SUBCUTANEOUS | Status: DC
  Administered 2018-04-05: 1 [IU] via SUBCUTANEOUS

## 2018-04-05 MED ORDER — PRO-STAT SUGAR FREE PO LIQD
30.0000 mL | Freq: Three times a day (TID) | ORAL | Status: DC
  Administered 2018-04-05 – 2018-04-07 (×7): 30 mL
  Filled 2018-04-05 (×7): qty 30

## 2018-04-05 MED ORDER — JEVITY 1.2 CAL PO LIQD
1000.0000 mL | ORAL | Status: DC
  Administered 2018-04-05: 1000 mL
  Filled 2018-04-05 (×6): qty 1000

## 2018-04-05 NOTE — Progress Notes (Signed)
eLink Physician-Brief Progress Note Patient Name: Yvette ShutterCarolyn I Orozco DOB: 1951/09/15 MRN: [id number]   Date of Service  04/05/2018  HPI/Events of Note  Hypokalemia  eICU Interventions  Peripheral iv K+ replacement protocol x 30 meq over 3 hours        [name] 04/05/2018, 6:16 AM

## 2018-04-05 NOTE — Progress Notes (Signed)
Initial Nutrition Assessment  DOCUMENTATION CODES:   Obesity unspecified  INTERVENTION:    Jevity 1.2 at 50 ml/h (1200 ml per day)  Pro-stat 30 ml TID  Provides 1740 kcal, 112 gm protein, 972 ml free water daily  NUTRITION DIAGNOSIS:   Increased nutrient needs related to wound healing as evidenced by estimated needs.  GOAL:   Patient will meet greater than or equal to 90% of their needs  MONITOR:   TF tolerance, Labs, Diet advancement, Skin  REASON FOR ASSESSMENT:   Consult Enteral/tube feeding initiation and management  ASSESSMENT:   67 yo female with PMH of HTN, HLD, GERD, arthritis, lumbago, obesity, PVD, chronic foot ulcers, and OSA who was admitted on 7/8 with PRES after being found down covered in urine and feces.   Received MD Consult for TF initiation and management. NGT in place. Patient is not alert enough to safely take PO's at this time. Patient unable to provide any nutrition information. Encephalopathy improving, but remains mostly nonverbal.  MRI bilateral feet ordered (L foot is actively infected).  Dr. Lajoyce Cornersuda has been consulted.  Labs reviewed. Sodium 133 (L), potassium 3.1 (L) CBG's: 110-93-127 Medications reviewed and include Novolog and Meropenem.    NUTRITION - FOCUSED PHYSICAL EXAM:    Most Recent Value  Orbital Region  No depletion  Upper Arm Region  No depletion  Thoracic and Lumbar Region  Unable to assess  Buccal Region  No depletion  Temple Region  No depletion  Clavicle Bone Region  No depletion  Clavicle and Acromion Bone Region  No depletion  Scapular Bone Region  No depletion  Dorsal Hand  No depletion  Patellar Region  No depletion  Anterior Thigh Region  No depletion  Posterior Calf Region  Unable to assess  Edema (RD Assessment)  Unable to assess  Hair  Reviewed  Eyes  Reviewed  Mouth  Unable to assess  Skin  Reviewed  Nails  Reviewed       Diet Order:   Diet Order           Diet NPO time specified  Diet  effective now          EDUCATION NEEDS:   No education needs have been identified at this time  Skin:  Skin Assessment: Skin Integrity Issues: Skin Integrity Issues:: DTI, Stage II, Other (Comment) DTI: R foot Stage II: Toe (R foot), buttocks Other: Non-pressure wound L foot  Last BM:  7/10 (type 4)  Height:   Ht Readings from Last 1 Encounters:  04/05/18 5\' 5"  (1.651 m)    Weight:   Wt Readings from Last 1 Encounters:  04/05/18 221 lb 9 oz (100.5 kg)    Ideal Body Weight:  56.8 kg  BMI:  Body mass index is 36.87 kg/m.  Estimated Nutritional Needs:   Kcal:  1700-1900  Protein:  100-115 gm  Fluid:  1.8 L    Joaquin CourtsKimberly Harris, RD, LDN, CNSC Pager [phone] After Hours Pager [phone]

## 2018-04-05 NOTE — Progress Notes (Signed)
Patient ID: [name], female   DOB: [birth year], 67 y.o.   MRN: [id number]   LOS: 3 days   Subjective: Nonverbal   Objective: Vital signs in last 24 hours: Temp:  [98.2 F (36.8 C)-98.8 F (37.1 C)] 98.6 F (37 C) (07/11 0717) Pulse Rate:  [102-128] 117 (07/11 0700) Resp:  [14-22] 22 (07/11 0700) BP: (115-164)/(71-153) 132/93 (07/11 0700) SpO2:  [76 %-99 %] 98 % (07/11 0700) Weight:  [100.5 kg (221 lb 9 oz)] 100.5 kg (221 lb 9 oz) (07/11 0356) Last BM Date: 04/04/18   Laboratory  CBC Recent Labs    04/03/18 0220 04/04/18 0734  WBC 12.5* 8.8  HGB 11.0* 10.8*  HCT 36.6 35.8*  PLT 469* 340   BMET Recent Labs    04/04/18 0734 04/05/18 0436  NA 136 133*  K 3.2* 3.1*  CL 96* 95*  CO2 26 25  GLUCOSE 109* 118*  BUN 14 13  CREATININE 0.96 0.94  CALCIUM 8.3* 8.3*     Physical Exam General appearance: no distress, awakens easily, tracks somewhat, no command following      Above pics right foot  Left foot has plantar 1st MT wound with active bloody purulent discharge, extension laterally and distally, little odor. Smaller wound over 5th MT, no purulence.    Assessment/Plan: Bilateral foot wounds, actively infected left foot -- Continue broad-spectrum abx. Awaiting MRI bilateral feet (unsure if done yet or not). Dr. Lajoyce Cornersuda to evaluate in AM. NPO after MN.    Freeman CaldronMichael J. Kenyada Dosch, PA-C Orthopedic Surgery [phone] 04/05/2018

## 2018-04-05 NOTE — Progress Notes (Signed)
Pharmacy Antibiotic Note  [name] is a 67 y.o. female admitted on 04/02/2018 with sepsis.  Currently on Cefepime and vancomycin. UCx today grew out >100k ESBL E.coli. WBC wnl. AFebrile.   Plan: Stop Cefepime and start meropenem 1 gm IV Q 8 hours Continue vancomycin 750 mg IV Q 12 hours  1730 Vanc trough tonight    Weight: 221 lb 9 oz (100.5 kg)  Temp (24hrs), Avg:98.6 F (37 C), Min:98.2 F (36.8 C), Max:98.8 F (37.1 C)  Recent Labs  Lab 04/02/18 1634 04/02/18 1647 04/02/18 2030 04/02/18 2215 04/03/18 0007 04/03/18 0218 04/03/18 0220 04/03/18 0543 04/04/18 0734 04/05/18 0436  WBC 13.5*  --   --   --   --   --  12.5*  --  8.8  --   CREATININE 1.40*  --   --   --  1.08*  --  1.07*  --  0.96 0.94  LATICACIDVEN  --  3.26* 1.97* 2.87*  --  2.1*  --  1.7  --   --     Estimated Creatinine Clearance: 69.1 mL/min (by C-G formula based on SCr of 0.94 mg/dL).    Allergies  Allergen Reactions  . Zolpidem Tartrate Other (See Comments)    Delusions, hallucinations  . Amoxicillin Rash and Other (See Comments)    Has patient had a PCN reaction causing immediate rash, facial/tongue/throat swelling, SOB or lightheadedness with hypotension: Yes Has patient had a PCN reaction causing severe rash involving mucus membranes or skin necrosis: No Has patient had a PCN reaction that required hospitalization: No Has patient had a PCN reaction occurring within the last 10 years: Yes If all of the above answers are "NO", then may proceed with Cephalosporin use.    Vinnie LevelBenjamin Tobias Avitabile, PharmD., BCPS Clinical Pharmacist Clinical phone for 04/05/18 until 3:30pm: [phone] If after 3:30pm, please refer to [facility] for unit-specific pharmacist

## 2018-04-05 NOTE — Consult Note (Signed)
WOC Nurse wound consult note [facility]  No family present.  Primary care RN present, and Dr. Vassie LollAlva to room to see results of left foot wound assessment. Reason for Consult: Left foot wounds and buttock wound Wound type: Patient with known PVD.  Formerly with a hard cast to the left foot.  Cast was removed 04/04/18. Today when I arrived to evaluate the patient, I found a kerlex wrap to the left foot.  When removed, I found what appeared to be a piece of dried alginate or hydrofiber of some kind, to the plantar surface of the 1st metatarsal head.  That area was brown in color resembling staining from a substance such as betadine.  There was a purple, fluctuant area extending towards the second toe.  When I removed the piece of dried dressing, purulent drainage quickly flowed out, approximately 30 ml worth.  I was able to express additional purulent drainage that included clumps resembling pus. The wound opening measures 0.6 cm x 0.6 cm.  When probed, I discovered circumferential undermining, the longest extending toward the second toe, measuring 4 cm in length.    The lateral/plantar surface of the left 5th metatarsal head had an open wound measuring 2 cm x 2.2 cm x 1 cm with a hard white substance in the wound bed.  Dr. Vassie LollAlva was requested to assist with assessing the wounds, which he did.  It was discussed that the pearly white in this wound bed could be bone, tendon, or ligament; very likely bone.  The remainder of the wound bed was clean and pink.  While evaluating the foot an area was discovered on the posterior heel consistent with a DTI from the cast.  The DTI area measured 1.5 cm x 4.5 cm, had three distinct maroom discolored areas, separated by narrow islets of normal color skin.    I recommended to Dr. Vassie LollAlva for a surgical consult to determine the need for surgical intervention on the foot.  For now I have entered orders to apply betadine to wounds on the foot, cover with  gauze, secure with kerlex every shift.  Additionally, on the patient's left, medial, inferior buttock there is a punched out wound that measures 1.5 cm x 1.5 cm x 0.3.  This is of an unknown etiology.  I question if it is viral in nature (zoster).  Plan of care for this area is Re[employer] hydrocolloid, for which I have sent an A-11 to materials management to obtain. Monitor the wound area(s) for worsening of condition such as: Signs/symptoms of infection,  Increase in size,  Development of or worsening of odor, Development of pain, or increased pain at the affected locations.  Notify the medical team if any of these develop.  Thank you for the consult.  Discussed plan of care with the patient and bedside nurse.  WOC nurse will not follow at this time.  Please re-consult the WOC team if needed.  Helmut MusterSherry Carsen Machi, RN, MSN, CWOCN, CNS-BC, pager [phone]

## 2018-04-05 NOTE — Progress Notes (Signed)
Subjective: Slightly more interactive today.   Exam: Vitals:   04/05/18 0700 04/05/18 0717  BP: (!) 132/93   Pulse: (!) 117   Resp: (!) 22   Temp:  98.6 F (37 C)  SpO2: 98%    Gen: In bed, NAD Resp: non-labored breathing, no acute distress Abd: soft, nt  Neuro: MS: Awake, fixates and tracks, She does not follow commands, but when I pinch her and ask if it hurts, she responds yes, then with a nod.  RU:EAVWUJCN:blinks to threat bilaterally Motor: localizes x 4, increased tone with tremor throughout.  Sensory:localizes to nox stim x 4.   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 She seems aphasic and I wonder if her old left parietal infarct is playing some role in this(though she is able to talk at baseline). Her imaging was suboptimal, but the diffusion sequence was enough to see that there is clearly much more T2 change than DWI change which is more consistent with PRES than strkoe.   Her exam is odd, she appears almost catatonic. Without as much improvement as I had hoped, I would favor overnight EEG to rule out subclinical seizures.   Recommendations: 1) Overnight EEG 2) will follow.   This patient is critically ill and at significant risk of neurological worsening, death and care requires constant monitoring of vital signs, hemodynamics,respiratory and cardiac monitoring, neurological assessment, discussion with family, other specialists and medical decision making of high complexity. I spent 35 minutes of neurocritical care time  in the care of  this patient.  Ritta SlotMcNeill Melaney Tellefsen, MD [facility] [phone]  If 7pm- 7am, please page neurology on call as listed in [facility]. 04/05/2018  10:33 AM

## 2018-04-05 NOTE — Progress Notes (Signed)
67 year old woman with chronic atrial fibrillation and prior CVA admitted 7/8 with encephalopathy and MRI consistent with PR ES syndrome.  She also has a history of chronic nonhealing wounds which has been followed by ID and orthopedics as outpatient.  On exam-remains poorly responsive, afebrile, blood pressure  better control of clevidipine drip , does not follow commands although tracks and looks at you when her name is called, right foot with multiple areas of abrasions, left foot has deep wounds with purulent bloody drainage, examined with wound care nurse, soft and nontender abdomen.  Labs show hypokalemia and hyponatremia, magnesium is improved Prior brain imaging was reviewed.  Impression/plan  Acute encephalopathy-attributed to PR ES rather than an acute stroke, EEG did not show any seizures, neurology following, repeat EEG planned to rule out subclinical seizures since her mental status remains poor.  Atrial fibrillation with RVR-change to oral amiodarone, increase Coreg to 12.5 twice daily and use IV metoprolol as needed. No heparin for now given small bleed on MRI.  Foot ulcers with suspicion of osteomyelitis-call Ortho to assess, await MRI Sepsis appears improving.  vancomycin stopped by ID, since MRSA PCR negative  ESBL E. coli UTI-change antibiotics to meropenem,    The patient is critically ill with multiple organ systems failure and requires high complexity decision making for assessment and support, frequent evaluation and titration of therapies, application of advanced monitoring technologies and extensive interpretation of multiple databases. Critical Care Time devoted to patient care services described in this note independent of APP/resident  time is 33 minutes.   Comer Locketakesh V. Vassie LollAlva MD

## 2018-04-05 NOTE — Progress Notes (Signed)
Patient ID: [name], female   DOB: 1950-12-01, 67 y.o.   MRN: [id number]         [facility]  Date of Admission:  04/02/2018           Day 4 vancomycin        Day 1 meropenem ASSESSMENT: She has developed recurrent left foot infection.  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  She could not stay still for attempted feet yesterday.  I would recommend attempting again when she is more cooperative.  She is growing ESBL producing E. coli from her urine culture.  I agree with meropenem for both possible symptomatic UTI and her foot infection.  Her staph aureus PCR was negative.  I will discontinue vancomycin.    PLAN: 1. Continue meropenem 2. Discontinue vancomycin 3. MRI of both feet when she is more cooperative  Active Problems:   Left foot infection   PRES (posterior reversible encephalopathy syndrome)   Pressure injury of skin   Scheduled Meds: . amiodarone  200 mg Oral BID  . aspirin  150 mg Rectal Daily  . carvedilol  12.5 mg Per NG tube BID WC  . chlorhexidine  15 mL Mouth Rinse BID  . feeding supplement (PRO-STAT SUGAR FREE 64)  30 mL Per Tube TID  . insulin aspart  1-3 Units Subcutaneous Q6H  . mouth rinse  15 mL Mouth Rinse BID   Continuous Infusions: . sodium chloride 10 mL (04/05/18 1006)  . famotidine (PEPCID) IV 20 mg (04/04/18 2121)  . feeding supplement (JEVITY 1.2 CAL)    . meropenem (MERREM) IV 1 g (04/05/18 1010)  . potassium chloride 10 mEq (04/05/18 1137)  . vancomycin 750 mg (04/05/18 0508)   PRN Meds:.sodium chloride, acetaminophen, hydrALAZINE, ondansetron (ZOFRAN) IV  Review of Systems: Review of Systems  Unable to perform ROS: Mental acuity    Allergies  Allergen Reactions  . Zolpidem Tartrate Other (See Comments)    Delusions, hallucinations  . Amoxicillin Rash and Other (See Comments)    Has patient had a PCN reaction causing immediate  rash, facial/tongue/throat swelling, SOB or lightheadedness with hypotension: Yes Has patient had a PCN reaction causing severe rash involving mucus membranes or skin necrosis: No Has patient had a PCN reaction that required hospitalization: No Has patient had a PCN reaction occurring within the last 10 years: Yes If all of the above answers are "NO", then may proceed with Cephalosporin use.     OBJECTIVE: Vitals:   04/05/18 1033 04/05/18 1034 04/05/18 1104 04/05/18 1111  BP:  (!) 134/94    Pulse: (!) 120 (!) 113    Resp: 18     Temp:    98.1 F (36.7 C)  TempSrc:    Oral  SpO2: 100%     Weight:      Height:   5\' 5"  (1.651 m)    Body mass index is 36.87 kg/m.  Physical Exam  Constitutional:  She is resting calmly in bed.  She is more responsive.  She tracks and she will occasionally answer simple questions with one-word answers.  Cardiovascular: Normal rate, regular rhythm and normal heart sounds.  Pulmonary/Chest: Effort normal and breath sounds normal.  Musculoskeletal:  The blister underneath her left first metatarsal head is now spontaneously draining purulent material.    Lab Results Lab Results  Component Value Date   WBC 8.8 04/04/2018   HGB  10.8 (L) 04/04/2018   HCT 35.8 (L) 04/04/2018   MCV 83.4 04/04/2018   PLT 340 04/04/2018    Lab Results  Component Value Date   CREATININE 0.94 04/05/2018   BUN 13 04/05/2018   NA 133 (L) 04/05/2018   K 3.1 (L) 04/05/2018   CL 95 (L) 04/05/2018   CO2 25 04/05/2018    Lab Results  Component Value Date   ALT 42 04/02/2018   AST 74 (H) 04/02/2018   ALKPHOS 138 (H) 04/02/2018   BILITOT 0.9 04/02/2018     Microbiology: Recent Results (from the past 240 hour(s))  Blood Culture (routine x 2)     Status: None (Preliminary result)   Collection Time: 04/02/18  4:17 PM  Result Value Ref Range Status   Specimen Description BLOOD RIGHT ANTECUBITAL  Final   Special Requests   Final    BOTTLES DRAWN AEROBIC ONLY Blood  Culture results may not be optimal due to an inadequate volume of blood received in culture bottles   Culture   Final    NO GROWTH 3 DAYS Performed at [facility], [address], [place], [place] [address]    Report Status PENDING  Incomplete  Culture, blood (Routine X 2) w Reflex to ID Panel     Status: None (Preliminary result)   Collection Time: 04/02/18  8:21 PM  Result Value Ref Range Status   Specimen Description BLOOD LEFT HAND  Final   Special Requests   Final    BOTTLES DRAWN AEROBIC AND ANAEROBIC Blood Culture results may not be optimal due to an inadequate volume of blood received in culture bottles   Culture   Final    NO GROWTH 3 DAYS Performed at [facility], [address], [place], [place] [address]    Report Status PENDING  Incomplete  Urine culture     Status: Abnormal   Collection Time: 04/02/18  8:57 PM  Result Value Ref Range Status   Specimen Description URINE, CATHETERIZED  Final   Special Requests NONE  Final   Culture (A)  Final    >=100,000 COLONIES/mL ESCHERICHIA COLI Confirmed Extended Spectrum Beta-Lactamase Producer (ESBL).  In bloodstream infections from ESBL organisms, carbapenems are preferred over piperacillin/tazobactam. They are shown to have a lower risk of mortality. Performed at [facility], [address], [place], [place] [address]    Report Status 04/05/2018 FINAL  Final   Organism ID, Bacteria ESCHERICHIA COLI (A)  Final      Susceptibility   Escherichia coli - MIC*    AMPICILLIN >=32 RESISTANT Resistant     CEFAZOLIN >=64 RESISTANT Resistant     CEFTRIAXONE >=64 RESISTANT Resistant     CIPROFLOXACIN >=4 RESISTANT Resistant     GENTAMICIN >=16 RESISTANT Resistant     IMIPENEM <=0.25 SENSITIVE Sensitive     NITROFURANTOIN <=16 SENSITIVE Sensitive     TRIMETH/SULFA <=20 SENSITIVE Sensitive     AMPICILLIN/SULBACTAM >=32 RESISTANT Resistant     PIP/TAZO <=4 SENSITIVE Sensitive     Extended ESBL POSITIVE Resistant       * >=100,000 COLONIES/mL ESCHERICHIA COLI  MRSA PCR Screening     Status: None   Collection Time: 04/02/18 11:02 PM  Result Value Ref Range Status   MRSA by PCR NEGATIVE NEGATIVE Final    Comment:        The GeneXpert MRSA Assay (FDA approved for NASAL specimens only), is one component of a comprehensive MRSA colonization surveillance program.  It is not intended to diagnose MRSA infection nor to guide or monitor treatment for MRSA infections. Performed at [facility], [address] [place], [place] [address]     [name], MD [facility] [phone] pager   [phone] cell 04/05/2018, 11:42 AM

## 2018-04-05 NOTE — Progress Notes (Addendum)
Spoke with Dr. Cliffton AstersJohn Campbell and he agreed to wait until 7/13 for MRI so pt could continue to progress neurologically for the best chance to obtain high quality images.

## 2018-04-05 NOTE — Progress Notes (Signed)
LTM running - no initial skin breakdown. 

## 2018-04-06 ENCOUNTER — Ambulatory Visit: Payer: Medicare Other | Admitting: Podiatry

## 2018-04-06 DIAGNOSIS — M869 Osteomyelitis, unspecified: Secondary | ICD-10-CM

## 2018-04-06 DIAGNOSIS — S81802A Unspecified open wound, left lower leg, initial encounter: Secondary | ICD-10-CM

## 2018-04-06 LAB — BASIC METABOLIC PANEL
ANION GAP: 10 (ref 5–15)
ANION GAP: 9 (ref 5–15)
BUN: 24 mg/dL — ABNORMAL HIGH (ref 8–23)
BUN: 20 mg/dL (ref 8–23)
CALCIUM: 8.2 mg/dL — AB (ref 8.9–10.3)
CO2: 27 mmol/L (ref 22–32)
CO2: 24 mmol/L (ref 22–32)
CREATININE: 1.07 mg/dL — AB (ref 0.44–1.00)
Calcium: 8.3 mg/dL — ABNORMAL LOW (ref 8.9–10.3)
Chloride: 100 mmol/L (ref 98–111)
Chloride: 102 mmol/L (ref 98–111)
Creatinine, Ser: 1.04 mg/dL — ABNORMAL HIGH (ref 0.44–1.00)
GFR calc Af Amer: >60 mL/min (ref >60)
GFR calc non Af Amer: 55 mL/min — ABNORMAL LOW (ref >60)
GFR calc non Af Amer: 53 mL/min — ABNORMAL LOW (ref >60)
GLUCOSE: 111 mg/dL — AB (ref 70–99)
Glucose, Bld: 121 mg/dL — ABNORMAL HIGH (ref 70–99)
POTASSIUM: 3.1 mmol/L — AB (ref 3.5–5.1)
POTASSIUM: 3.5 mmol/L (ref 3.5–5.1)
Sodium: 137 mmol/L (ref 135–145)
Sodium: 135 mmol/L (ref 135–145)

## 2018-04-06 LAB — CBC
HCT: 35.1 % — ABNORMAL LOW (ref 36.0–46.0)
HEMOGLOBIN: 10.4 g/dL — AB (ref 12.0–15.0)
MCH: 24.9 pg — ABNORMAL LOW (ref 26.0–34.0)
MCHC: 29.6 g/dL — ABNORMAL LOW (ref 30.0–36.0)
MCV: 84 fL (ref 78.0–100.0)
Platelets: 324 10*3/uL (ref 150–400)
RBC: 4.18 MIL/uL (ref 3.87–5.11)
RDW: 18 % — AB (ref 11.5–15.5)
WBC: 8.7 10*3/uL (ref 4.0–10.5)

## 2018-04-06 LAB — GLUCOSE, CAPILLARY
GLUCOSE-CAPILLARY: 107 mg/dL — AB (ref 70–99)
GLUCOSE-CAPILLARY: 107 mg/dL — AB (ref 70–99)
GLUCOSE-CAPILLARY: 107 mg/dL — AB (ref 70–99)
GLUCOSE-CAPILLARY: 102 mg/dL — AB (ref 70–99)
Glucose-Capillary: 112 mg/dL — ABNORMAL HIGH (ref 70–99)
Glucose-Capillary: 96 mg/dL (ref 70–99)
Glucose-Capillary: 97 mg/dL (ref 70–99)

## 2018-04-06 LAB — PHOSPHORUS: PHOSPHORUS: 3.1 mg/dL (ref 2.5–4.6)

## 2018-04-06 LAB — MAGNESIUM: Magnesium: 2 mg/dL (ref 1.7–2.4)

## 2018-04-06 MED ORDER — POTASSIUM CHLORIDE 20 MEQ/15ML (10%) PO SOLN
40.0000 meq | ORAL | Status: AC
  Administered 2018-04-06 (×2): 40 meq
  Filled 2018-04-06 (×2): qty 30

## 2018-04-06 MED ORDER — ASPIRIN 81 MG PO CHEW
81.0000 mg | CHEWABLE_TABLET | Freq: Every day | ORAL | Status: DC
  Administered 2018-04-06 – 2018-04-07 (×2): 81 mg
  Filled 2018-04-06 (×2): qty 1

## 2018-04-06 NOTE — Progress Notes (Signed)
Patient ID: [name], female   DOB: 05/23/1951, 67 y.o.   MRN: [id number]          [facility]    Date of Admission:  04/02/2018    Total days of antibiotics 5        Day 2 meropenem  She has left foot infection.  MRI has been rescheduled for tomorrow.  The meropenem is doing double duty covering her UTI and foot infection.  Please call me for any infectious disease questions this weekend.         Cliffton AstersJohn Linzie Criss, MD [facility] [phone] pager   [phone] cell 04/06/2018, 12:31 PM

## 2018-04-06 NOTE — Progress Notes (Signed)
LTM discontinued. No skin breakdown was seen. 

## 2018-04-06 NOTE — Evaluation (Signed)
Clinical/Bedside Swallow Evaluation Patient Details  Name: [name] MRN: [id number] Date of Birth: 07/16/1951  Today's Date: 04/06/2018 Time: SLP Start Time (ACUTE ONLY): 1520 SLP Stop Time (ACUTE ONLY): 1530 SLP Time Calculation (min) (ACUTE ONLY): 10 min  Past Medical History:  Past Medical History:  Diagnosis Date  . Anxiety   . 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  PT ALSO HAS LOWER BACK PAIN AT TIMES  . Depression   . GERD (gastroesophageal reflux disease)    RARE- OTC ANTACID IF NEEDED--PAST HX OF ULCER  . Headache    wakes up with headache   . Hyperlipidemia   . Hypertension   . Kidney stones    PT PASSED STONES--NO KNOWN STONES AT PRESENT TIMES  . Lumbago 10/02/2013  . Obesity   . OSA (obstructive sleep apnea) 12/30/2013   Very mild OSA with REM accentuation,  [facility], Dr Frances FurbishAthar .   Marland Kitchen. Periodic limb movement disorder (PLMD) 12/30/2013  . Restless leg syndrome   . Ulcer   . Urinary incontinence    Past Surgical History:  Past Surgical History:  Procedure Laterality Date  . ABDOMINAL AORTOGRAM W/LOWER EXTREMITY N/A 10/10/2017   Procedure: ABDOMINAL AORTOGRAM W/LOWER EXTREMITY;  Surgeon: [name], Yvette W, MD;  Location: [facility] INVASIVE CV LAB;  Service: Cardiovascular;  Laterality: N/A;  . BILATERAL BUNIONECTOMY 1979    . BREAST SURGERY     biopsy   . COLON SURGERY    . PERIPHERAL VASCULAR INTERVENTION  10/10/2017   Procedure: PERIPHERAL VASCULAR INTERVENTION;  Surgeon: [name], Yvette W, MD;  Location: [facility] INVASIVE CV LAB;  Service: Cardiovascular;;  lt. Iliac  . SMALL INTESTINE SURGERY    . SURGERY FOR BOWEL OBSTRUCTION AND REMOVAL OF PART OF STOMACH  IN 2000  2000  . TOTAL KNEE ARTHROPLASTY  02/16/2012   Procedure: TOTAL KNEE ARTHROPLASTY;  Surgeon: Yvette DockerJeffrey C Beane, MD;  Location: [facility] ORS;  Service: Orthopedics;  Laterality: Left;  . TOTAL KNEE ARTHROPLASTY Right 11/20/2014   Procedure: RIGHT TOTAL KNEE ARTHROPLASTY;  Surgeon: Yvette DockerJeffrey  C Beane, MD;  Location: [facility] ORS;  Service: Orthopedics;  Laterality: Right;   HPI:  67 year old woman with chronic atrial fibrillation and prior CVA admitted 7/8 with encephalopathy and MRI consistent with PRES syndrome.  She also has a history of chronic nonhealing wounds which has been followed by ID and orthopedics as outpatient.   Assessment / Plan / Recommendation Clinical Impression  Pt demonstrates adequate swallow ability, though NG tube is impeding function. Pt will be able to initiate thin liquids and soft solids as long as NG tube is out. Soft solids recommended due to mental status.  SLP Visit Diagnosis: Dysphagia, unspecified (R13.10)    Aspiration Risk  Mild aspiration risk    Diet Recommendation Dysphagia 3 (Mech soft);Thin liquid   Liquid Administration via: Cup;Straw Medication Administration: Whole meds with liquid Supervision: Staff to assist with self feeding Compensations: Slow rate;Small sips/bites Postural Changes: Seated upright at 90 degrees    Other  Recommendations     Follow up Recommendations None      Frequency and Duration            Prognosis        Swallow Study   General HPI: 67 year old woman with chronic atrial fibrillation and prior CVA admitted 7/8 with encephalopathy and MRI consistent with PRES syndrome.  She also has a history of chronic nonhealing wounds which has been followed by ID  and orthopedics as outpatient. Type of Study: Bedside Swallow Evaluation Diet Prior to this Study: NPO Temperature Spikes Noted: No Respiratory Status: Room air History of Recent Intubation: No Behavior/Cognition: Alert;Requires cueing Oral Care Completed by SLP: No Oral Cavity - Dentition: Adequate natural dentition Vision: Functional for self-feeding Self-Feeding Abilities: Able to feed self;Needs assist Patient Positioning: Upright in bed Baseline Vocal Quality: Normal Volitional Cough: Strong Volitional Swallow: Unable to elicit     Oral/Motor/Sensory Function Overall Oral Motor/Sensory Function: Within functional limits   Ice Chips     Thin Liquid Thin Liquid: Impaired Presentation: Cup;Straw Pharyngeal  Phase Impairments: Cough - Immediate    Nectar Thick Nectar Thick Liquid: Not tested   Honey Thick Honey Thick Liquid: Not tested   Puree Puree: Within functional limits Presentation: Spoon   Solid   GO   Solid: Not tested       Yvette Ditty, MA CCC-SLP [phone]  [name] 04/06/2018,3:36 PM

## 2018-04-06 NOTE — Progress Notes (Signed)
67 year old woman with chronic atrial fibrillation and prior CVA admitted 7/8 with encephalopathy and MRI consistent with PRES syndrome.  She also has a history of chronic nonhealing wounds which has been followed by ID and orthopedics as outpatient.  On exam-more awake, interactive, follows commands, soft and nontender abdomen, decreased breath sounds bilateral, right foot with multiple abrasions, left foot with deep wounds and bloody drainage.  Labs show hypokalemia, hypomagnesemia is resolved.  Impression/plan Acute encephalopathy-due to PRES / sepsis , now resolved, can discontinue long-term EEG, proceed with PT consult and swallow consult  Atrial fibrillation with RVR-resumed Coreg at home dose 12.5 twice daily, use IV metoprolol as needed, she was on home Tikosyn and pharmacy to investigate as we can restart, meanwhile we are using oral amiodarone Anticoagulation has been held per neurology due to small bleed on MRI. qtc 0.38  Foot ulcers with metatarsal osteomyelitis-assessed by Ortho for this outpatient and inpatient, await MRI foot but will likely need ray amputation  ESBL E. coli UTI-started on meropenem 7/11, will plan for 5 days and discontinue Foley  Can transfer to telemetry and to triad 7/13  Cyril Mourningakesh Alva MD. FCCP. [facility] Pager [phone] If no response call [phone]   04/06/2018

## 2018-04-06 NOTE — Progress Notes (Addendum)
PULMONARY / CRITICAL CARE MEDICINE   Name: [name] MRN: [id number] DOB: [birth year]    ADMISSION DATE:  04/02/2018 CONSULTATION DATE:  7/8  REFERRING MD:  Dr. Laverta Baltimore  CHIEF COMPLAINT:  AMS -found down  HISTORY OF PRESENT ILLNESS:   67 year old female with PMH as below, which is significant for Atrial fib on Eliquis, OSA, and neuropathy. Her recent medical course has been complicated by ongoing foot wounds/ulcers. She was admitted for this back in April of this year at what time she was found to have and abscess on the R foot and underwent surgical I&D. Cultures were negative. She was treated with an extended course of IV antibiotics, Then off and on oral antibiotics. She has been followed by [name] in the ID clinic. Also followed by [name] with [facility] who felt nothing to do surgically in May.  Most recently she was started on Doxycyline 6/21 at ortho clinic as she had continued discharge. ID felt she may need further MRI.   Then 7/8 she was found down by home health aide. She was last known well Friday (7/5) afternoon. In the ED she was alert, but only intermittently answering yes/no questions. Very altered. Sent for CT scan which was concerning for infarct vs PRES. Her BP was borderline high, but diastolic notably high. She was started on cleviprex infusion. She had MRI, which was more consistent with PRES. PCCM admitted.    SUBJECTIVE:  Patient reports that she is not in pain this morning. She is more alert.   VITAL SIGNS: BP (!) 150/95   Pulse (!) 114   Temp 98.1 F (36.7 C) (Oral)   Resp 13   Ht _0  (1.651 m) Comment: from admission 02/28/18  Wt 224 lb 6.9 oz (101.8 kg)   LMP  (LMP Unknown)   SpO2 100%   BMI 37.35 kg/m   HEMODYNAMICS:    VENTILATOR SETTINGS:    INTAKE / OUTPUT: I/O last 3 completed shifts: In: 2180.4 [I.V.:649; NG/GT:768; IV Piggyback:763.4] Out: 1251 [Urine:1251]  PHYSICAL EXAMINATION: General: NAD, interactive,  somewhat confused HEENT: [place] in place Cardiovascular: RRR, no m/r/g, no LE edema Respiratory: CTA BL, normal work of breathing on [place] Gastrointestinal: soft, nontender, nondistended, normoactive BS Extremities: warm, dry; with gauze applied Derm: no rashes appreciated Neuro: follows commands, interactive  LABS:  BMET Recent Labs  Lab 04/04/18 0734 04/05/18 0436 04/06/18 0237  NA 136 133* 137  K 3.2* 3.1* 3.1*  CL 96* 95* 100  CO2 _1 BUN 14 13 24*  CREATININE 0.96 0.94 1.04*  GLUCOSE 109* 118* 111*    Electrolytes Recent Labs  Lab 04/04/18 0734 04/05/18 0436 04/06/18 0237  CALCIUM 8.3* 8.3* 8.2*  MG 1.6* 1.8 2.0  PHOS 2.3* 2.8 3.1    CBC Recent Labs  Lab 04/03/18 0220 04/04/18 0734 04/06/18 0237  WBC 12.5* 8.8 8.7  HGB 11.0* 10.8* 10.4*  HCT 36.6 35.8* 35.1*  PLT 469* 340 324    Coag's Recent Labs  Lab 04/02/18 2206 04/03/18 0543 04/03/18 1521  APTT 34 56* 54*  INR 1.13  --   --     Sepsis Markers Recent Labs  Lab 04/02/18 2206 04/02/18 2215 04/03/18 0218 04/03/18 0543  LATICACIDVEN  --  2.87* 2.1* 1.7  PROCALCITON 0.12  --   --   --     ABG Recent Labs  Lab 04/03/18 1020  PHART 7.535*  PCO2ART 31.8*  PO2ART 67.7*    Liver  Enzymes Recent Labs  Lab 04/02/18 1634  AST 74*  ALT 42  ALKPHOS 138*  BILITOT 0.9  ALBUMIN 2.9*    Cardiac Enzymes Recent Labs  Lab 04/03/18 0007 04/03/18 0220 04/03/18 1049  TROPONINI 2.15* 1.84* 1.11*    Glucose Recent Labs  Lab 04/05/18 1103 04/05/18 1522 04/05/18 1924 04/05/18 2325 04/06/18 0311 04/06/18 0813  GLUCAP 115* 131* 123* 107* 107* 97    Imaging No results found.   STUDIES:  CT head 7/8 > Bilateral occipital lobe/PCA territory acute infarcts versus PRES. RIGHT occipital lobe petechial hemorrhage. Small potentially acute LEFT MCA territory infarct or PRES. 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 Moderate parenchymal brain volume  loss. Moderate chronic small vessel ischemic changes. MRI head 7/8 > T2 hyperintense signal abnormality involving cortex and subcortical white matter in a posterior distribution. Scattered cortical reduced diffusion. Findings are consistent with PRES and atypical for ischemia. Xray R foot 7/8 > No acute bone abnormality. No radiographic evidence suggestive of Osteomyelitis. Soft tissue abscess. Xray R foot 7/8 > No acute abnormalities. Degenerative changes as described. Chronic soft tissue swelling lateral to the fifth MTP joint.  CULTURES: Blood 7/8 >> Urine 7/8 >>100k gnr>> ESBL E. Coli   ANTIBIOTICS: Aztreonam 7/8 >7/9 Vancomycin 7/8 >7/11 Levofloxacin 7/8 >7/9 Cefepime 7/9>>7/11 Meropenem 7/11>>  SIGNIFICANT EVENTS: 7/8 admit 7/9 ID consulted 7/10 MRI of the brain>> PRES  LINES/TUBES: Foley cath 7/10>> NG tube 7/11>>  DISCUSSION: 67 year old female admitted with altered mental status hypertension last seen normal 03/31/2018.  Change in her mental status continue to be difficult to evaluate.  Diagnosed with PRES and now with ortho to see and MRI of L foot for r/o of osteo.  ASSESSMENT / PLAN:  PULMONARY A: OSA unclear if on CPAP P:   Monitor respiratory status  CARDIOVASCULAR A:  Atrial fibrillation on eliquis  Hypertension NSTEMI - likely demand P:  Patient stable for tele  Continue oral amiodarone, coreg, aspirin Prn hydralazine  Holding tikosyn, will need to re-anticoagulate before resuming and involve EP at that time before it is re-started  RENAL  A:   AKI (improved) Met acidosis(resolved) P:   Trend BMP / urinary output Replace electrolytes as indicated Avoid nephrotoxic agents, ensure adequate renal perfusion  GASTROINTESTINAL A:   No acute issues P:   Nutrition for TF  HEMATOLOGIC A:   AC for AF P:  CBC Transfuse per protocol   INFECTIOUS A:   ESBL E. Coli UTI Ulcers with concern for osteomyelitis- MRI of L and R foot pending  P:    F/u blood cx ID following Continue meropenem Await MRI BL foot  ENDOCRINE CBG (last 3)  A:   No acute issues P:   Monitor   NEUROLOGIC A:   PRES Essential tremor - treated with parkinsons medications.   Acute Encephalopathy- likely 2/2 PRES, improving P:   Neurology following  MUSCULOSKELETAL A. Short leg cast in place left leg Ulcers with concern for osteomyelitis P Ortho following  FAMILY  - Updates: No family at bedside during rounds 7/12.  - Inter-disciplinary family meet or Palliative Care meeting due by:  7/14  Stable for transfer to telemetry.   [place] Annelyse Rey, DO PGY-2, [facility]  04/06/2018, 11:13 AM

## 2018-04-06 NOTE — Progress Notes (Signed)
Subjective: Much improved  Exam: Vitals:   04/06/18 0800 04/06/18 0818  BP: (!) 111/98   Pulse: (!) 123   Resp: 14   Temp:  98.1 F (36.7 C)  SpO2: 99%    Gen: In bed, NAD Resp: non-labored breathing, no acute distress Abd: soft, nt  Neuro: MS: Awake, fixates and tracks, She follows commands readily and answers simple questions, though not oriented.  ZO:[id number]:blinks to threat bilaterally Motor: follows commands x 4.  Sensory:localizes to nox stim x 4.   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 She is now improving. I suspect that her encephalopathy has been multifactorial including PRES and septic encephalopathy. Her improvement is reassuring.   Recommendations: 1) can d/c LTM if no sz seen.  2) will follow.   This patient is critically ill and at significant risk of neurological worsening, death and care requires constant monitoring of vital signs, hemodynamics,respiratory and cardiac monitoring, neurological assessment, discussion with family, other specialists and medical decision making of high complexity. I spent 35 minutes of neurocritical care time  in the care of  this patient.  Ritta SlotMcNeill Kawhi Diebold, MD [facility] [phone]  If 7pm- 7am, please page neurology on call as listed in AMION. 04/06/2018  8:48 AM

## 2018-04-06 NOTE — Procedures (Signed)
LTM-EEG Report  HISTORY: Continuous video-EEG monitoring performed for 67 year old with PRES, altered mental status. ACQUISITION: International 10-20 system for electrode placement; 18 channels with additional eyes linked to ipsilateral ears and EKG. Additional T1-T2 electrodes were used. Continuous video recording obtained.   EEG NUMBER:  MEDICATIONS:  Day 1: meropenem  DAY #1: from 1352 04/05/18 to 0730 04/06/18  BACKGROUND: An overall medium voltage continuous recording with some spontaneous variability and reactivity. The background consisted of medium voltage theta with some delta activity bilaterally and sparse superimposed faster frequencies. State changes were captured, however no sustained waking patterns or sleep architecture were seen.  EPILEPTIFORM/PERIODIC ACTIVITY: There were frequent runs of medium voltage generalized periodic discharges (GPDs) during stimulation, occurring with frequency 1Hz , with sharp or triphasic appearance. These were not present during periods of rest. This pattern was consistent with SIRPIDs.  SEIZURES: none  EVENTS: none   EKG: abnormal rhythm  SUMMARY: This was a moderately abnormal continuous video EEG due to generalized slowing and GPD/SIRPID pattern during arousal. This was consistent with a severe diffuse cerebral disturbance and can be seen in toxic/metabolic encephalopathies. The discharges were sharply contoured but did not show definite ictal evolution.

## 2018-04-06 NOTE — Consult Note (Signed)
ORTHOPAEDIC CONSULTATION  REQUESTING PHYSICIAN: Hammonds, Curt JewsKathleen H, MD  Chief Complaint: Ulcerations bilateral lower extremities.  HPI: [name] is a 67 y.o. female who presents with multiple organ system failure with ulcerations of both lower extremities.  Past Medical History:  Diagnosis Date  . Anxiety   . 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  PT ALSO HAS LOWER BACK PAIN AT TIMES  . Depression   . GERD (gastroesophageal reflux disease)    RARE- OTC ANTACID IF NEEDED--PAST HX OF ULCER  . Headache    wakes up with headache   . Hyperlipidemia   . Hypertension   . Kidney stones    PT PASSED STONES--NO KNOWN STONES AT PRESENT TIMES  . Lumbago 10/02/2013  . Obesity   . OSA (obstructive sleep apnea) 12/30/2013   Very mild OSA with REM accentuation,  [facility], Dr Frances FurbishAthar .   Marland Kitchen. Periodic limb movement disorder (PLMD) 12/30/2013  . Restless leg syndrome   . Ulcer   . Urinary incontinence    Past Surgical History:  Procedure Laterality Date  . ABDOMINAL AORTOGRAM W/LOWER EXTREMITY N/A 10/10/2017   Procedure: ABDOMINAL AORTOGRAM W/LOWER EXTREMITY;  Surgeon: Nada LibmanBrabham, Vance W, MD;  Location: [facility] IN[facility];  Service: Cardiovascular;  Laterality: N/A;  . BILATERAL BUNIONECTOMY 1979    . BREAST SURGERY     biopsy   . COLON SURGERY    . PERIPHERAL VASCULAR INTERVENTION  10/10/2017   Procedure: PERIPHERAL VASCULAR INTERVENTION;  Surgeon: Nada LibmanBrabham, Vance W, MD;  Location: [facility] INV[facility];  Service: Cardiovascular;;  lt. Iliac  . SMALL INTESTINE SURGERY    . SURGERY FOR BOWEL OBSTRUCTION AND REMOVAL OF PART OF STOMACH  IN 2000  2000  . TOTAL KNEE ARTHROPLASTY  02/16/2012   Procedure: TOTAL KNEE ARTHROPLASTY;  Surgeon: Javier DockerJeffrey C Beane, MD;  Location: [facility];  Service: Orthopedics;  Laterality: Left;  . TOTAL KNEE ARTHROPLASTY Right 11/20/2014   Procedure: RIGHT TOTAL KNEE ARTHROPLASTY;  Surgeon: Javier DockerJeffrey C Beane, MD;  Location: [facility];   Service: Orthopedics;  Laterality: Right;   Social History   Socioeconomic History  . Marital status: Single    Spouse name: Not on file  . Number of children: 0  . Years of education: 914  . Highest education level: Not on file  Occupational History  . Not on file  Social Needs  . Financial resource strain: Not on file  . Food insecurity:    Worry: Not on file    Inability: Not on file  . Transportation needs:    Medical: Not on file    Non-medical: Not on file  Tobacco Use  . Smoking status: Former Smoker    Packs/day: 2.00    Years: 20.00    Pack years: 40.00    Types: Cigarettes    Last attempt to quit: 10/17/1988    Years since quitting: 29.4  . Smokeless tobacco: Never Used  Substance and Sexual Activity  . Alcohol use: No  . Drug use: No    Comment: Marijuana - 25 years ago  . Sexual activity: Not on file  Lifestyle  . Physical activity:    Days per week: Not on file    Minutes per session: Not on file  . Stress: Not on file  Relationships  . Social connections:    Talks on phone: Not on file    Gets together: Not on file    Attends  religious service: Not on file    Active member of club or organization: Not on file    Attends meetings of clubs or organizations: Not on file    Relationship status: Not on file  Other Topics Concern  . Not on file  Social History Narrative   Patient is single and lives alone.   Patient is disabled.   Patient has a college education.   Patient is right-handed.   Patient drinks two cokes per day.   Family History  Problem Relation Age of Onset  . Sleep apnea Father   . Hypertension Father   . Hypertension Mother   . Multiple sclerosis Sister   . Multiple sclerosis Sister    - negative except otherwise stated in the family history section Allergies  Allergen Reactions  . Zolpidem Tartrate Other (See Comments)    Delusions, hallucinations  . Amoxicillin Rash and Other (See Comments)    Has patient had a PCN  reaction causing immediate rash, facial/tongue/throat swelling, SOB or lightheadedness with hypotension: Yes Has patient had a PCN reaction causing severe rash involving mucus membranes or skin necrosis: No Has patient had a PCN reaction that required hospitalization: No Has patient had a PCN reaction occurring within the last 10 years: Yes If all of the above answers are "NO", then may proceed with Cephalosporin use.    Prior to Admission medications   Medication Sig Start Date End Date Taking? Authorizing Provider  acetaminophen (TYLENOL) 325 MG tablet Take 325-650 mg by mouth every 6 (six) hours as needed for mild pain or headache.     [provider]  amLODipine (NORVASC) 5 MG tablet Take 1 tablet (5 mg total) by mouth daily. 12/19/17 04/05/18  Rosalio Macadamia, NP  benztropine (COGENTIN) 0.5 MG tablet Take 1.5 mg by mouth daily.    [provider]  buPROPion (WELLBUTRIN XL) 150 MG 24 hr tablet Take 150 mg by mouth 2 (two) times daily.     [provider]  carvedilol (COREG) 12.5 MG tablet Take 12.5 mg by mouth 2 (two) times daily with a meal.    [provider]  carvedilol (COREG) 6.25 MG tablet Take 1 tablet (6.25 mg total) by mouth 2 (two) times daily. Patient not taking: Reported on 04/02/2018 07/12/17   Corky Crafts, MD  cefUROXime (CEFTIN) 500 MG tablet Take 500 mg by mouth 2 (two) times daily with a meal.    [provider]  cyanocobalamin (,VITAMIN B-12,) 1000 MCG/ML injection Inject 1,000 mcg into the muscle every 30 (thirty) days.    [provider]  dofetilide (TIKOSYN) 250 MCG capsule TAKE 1 CAPSULE(250 MCG) BY MOUTH TWICE DAILY 10/20/17   Rosalio Macadamia, NP  doxycycline (VIBRA-TABS) 100 MG tablet Take 1 tablet (100 mg total) by mouth 2 (two) times daily. 03/21/18   Ginnie Smart, MD  ELIQUIS 5 MG TABS tablet Take 5 mg by mouth 2 (two) times daily. 10/20/16   [provider]  fluticasone (FLONASE) 50 MCG/ACT  nasal spray Place 1 spray into both nostrils daily as needed for allergies.     [provider]  gabapentin (NEURONTIN) 600 MG tablet Take 600 mg by mouth 4 (four) times daily.    [provider]  hydrALAZINE (APRESOLINE) 50 MG tablet TAKE 1 AND 1/2 TABLETS(75 MG) BY MOUTH THREE TIMES DAILY 02/06/18   Jake Bathe, MD  iron polysaccharides (NIFEREX) 150 MG capsule Take 150 mg by mouth daily.    [provider]  lisinopril (PRINIVIL,ZESTRIL) 40 MG tablet Take 40 mg by mouth daily.    [provider]  Magnesium Oxide 400 MG CAPS Take 1 capsule (400 mg total) by mouth daily. 12/20/17   Rosalio Macadamia, NP  mirtazapine (REMERON) 15 MG tablet Take 15 mg by mouth daily as needed (sleep).     [provider]  Multiple Vitamins-Minerals (MULTIVITAMIN & MINERAL PO) Take 1 tablet by mouth daily.    [provider]  pravastatin (PRAVACHOL) 40 MG tablet Take 40 mg by mouth daily.  11/08/17   [provider]  traMADol (ULTRAM) 50 MG tablet Take 50 mg by mouth 3 (three) times daily as needed.    [provider]  vortioxetine HBr (TRINTELLIX) 5 MG TABS tablet Take 5 mg by mouth daily.    [provider]   Mr Maxine Glenn Head Wo Contrast  Result Date: 04/04/2018 CLINICAL DATA:  67 y/o  F; encephalopathy. EXAM: MRI HEAD WITHOUT AND WITH CONTRAST MRA HEAD WITHOUT CONTRAST TECHNIQUE: Multiplanar, multiecho pulse sequences of the brain and surrounding structures were obtained without and with intravenous contrast. Angiographic images of the head were obtained using MRA technique without contrast. CONTRAST:  18 cc MultiHance COMPARISON:  04/02/2018 MRI head. FINDINGS: MRI HEAD FINDINGS Brain: Few punctate foci of reduced diffusion in the right parietal cortex. Diffusion signal abnormality is decreased when compared with the prior MRI of the head. T2 FLAIR hyperintense signal abnormality involving subcortical white matter of the posterior frontal,  parietal, and occipital lobes going cortex in the occipital lobes. Signal abnormality is stable in distribution when compared with prior motion degraded MRI. 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 Stable background of moderate chronic microvascular ischemic changes and parenchymal volume loss of the brain. No abnormal susceptibility hypointensity to indicate intracranial hemorrhage. No extra-axial collection, hydrocephalus, or herniation. No focal mass effect of the brain. Vascular: Normal flow voids. Skull and upper cervical spine: Normal marrow signal. Sinuses/Orbits: Mild ethmoid sinus mucosal thickening and partial opacification of mastoid air cells, probably due to nasoenteric tube. No abnormal signal of orbits. Other: None. MRA HEAD FINDINGS Internal carotid arteries:  Patent. Anterior cerebral arteries:  Patent. Middle cerebral arteries: Patent. Anterior communicating artery: Patent. Posterior communicating arteries: Right fetal PCA. No left posterior communicating artery identified, likely hypoplastic or absent. Posterior cerebral arteries:  Patent. Basilar artery:  Patent. Vertebral arteries:  Patent.  Left dominant. Motion degraded study, suboptimal assessment for small aneurysm or stenosis. No large aneurysm. IMPRESSION: MRI head: 1. Stable distribution of T2 FLAIR signal abnormality involving subcortical white matter and cortex in the posterior distribution. Diminished diffusion signal abnormality. 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 2. No new acute intracranial abnormality identified. 3. 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 MRA head: 1. Motion degraded study, suboptimal assessment for small aneurysm or stenosis. 2. Patent anterior and posterior intracranial circulation. No large vessel occlusion. Electronically Signed   By: Mitzi Hansen M.D.   On: 04/04/2018 15:59   Mr Laqueta Jean WU Contrast  Result Date: 04/04/2018 CLINICAL DATA:  67 y/o  F; encephalopathy. EXAM: MRI HEAD WITHOUT AND WITH CONTRAST MRA HEAD WITHOUT CONTRAST TECHNIQUE: Multiplanar, multiecho pulse sequences of the brain and surrounding structures were obtained without and with intravenous contrast. Angiographic images of the head were obtained using MRA technique without contrast. CONTRAST:  18 cc MultiHance COMPARISON:  04/02/2018 MRI head.  FINDINGS: MRI HEAD FINDINGS Brain: Few punctate foci of reduced diffusion in the right parietal cortex. Diffusion signal abnormality is decreased when compared with the prior MRI of the head. T2 FLAIR hyperintense signal abnormality involving subcortical white matter of the posterior frontal, parietal, and occipital lobes going cortex in the occipital lobes. Signal abnormality is stable in distribution when compared with prior motion degraded MRI. 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 Stable background of moderate chronic microvascular ischemic changes and parenchymal volume loss of the brain. No abnormal susceptibility hypointensity to indicate intracranial hemorrhage. No extra-axial collection, hydrocephalus, or herniation. No focal mass effect of the brain. Vascular: Normal flow voids. Skull and upper cervical spine: Normal marrow signal. Sinuses/Orbits: Mild ethmoid sinus mucosal thickening and partial opacification of mastoid air cells, probably due to nasoenteric tube. No abnormal signal of orbits. Other: None. MRA HEAD FINDINGS Internal carotid arteries:  Patent. Anterior cerebral arteries:  Patent. Middle cerebral arteries: Patent. Anterior communicating artery: Patent. Posterior communicating arteries: Right fetal PCA. No left posterior communicating artery identified, likely hypoplastic or absent. Posterior cerebral arteries:  Patent. Basilar artery:  Patent.  Vertebral arteries:  Patent.  Left dominant. Motion degraded study, suboptimal assessment for small aneurysm or stenosis. No large aneurysm. IMPRESSION: MRI head: 1. Stable distribution of T2 FLAIR signal abnormality involving subcortical white matter and cortex in the posterior distribution. Diminished diffusion signal abnormality. 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 2. No new acute intracranial abnormality identified. 3. 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 MRA head: 1. Motion degraded study, suboptimal assessment for small aneurysm or stenosis. 2. Patent anterior and posterior intracranial circulation. No large vessel occlusion. Electronically Signed   By: Mitzi Hansen M.D.   On: 04/04/2018 15:59   Dg Chest Port 1 View  Result Date: 04/05/2018 CLINICAL DATA:  Respiratory failure EXAM: PORTABLE CHEST 1 VIEW COMPARISON:  04/04/2018 FINDINGS: Mild improvement in bibasilar airspace disease. Improved lung volume. Negative for heart failure or effusion. NG tube is in place with the tip in the stomach. IMPRESSION: Improvement in bibasilar atelectasis.  NG in stomach. Electronically Signed   By: Marlan Palau M.D.   On: 04/05/2018 07:00   Dg Abd Portable 1v  Result Date: 04/04/2018 CLINICAL DATA:  Nasogastric tube placement EXAM: PORTABLE ABDOMEN - 1 VIEW COMPARISON:  April 02, 2018 FINDINGS: Nasogastric tube tip and side port are in the stomach. There is no bowel dilatation or air-fluid level to suggest bowel obstruction. No free air. There are surgical clips in the upper abdomen and gastroesophageal junction regions. Lung bases are clear. IMPRESSION: Nasogastric tube tip and side port in stomach. Bowel gas pattern unremarkable. Electronically Signed   By: Bretta Bang III M.D.   On: 04/04/2018 10:09   - pertinent xrays, CT, MRI studies were reviewed and independently  interpreted  Positive ROS: All other systems have been reviewed and were otherwise negative with the exception of those mentioned in the HPI and as above.  Physical Exam: General: Alert, no acute distress Psychiatric: Patient is competent for consent with normal mood and affect Lymphatic: No axillary or cervical lymphadenopathy Cardiovascular: No pedal edema Respiratory: No cyanosis, no use of accessory musculature GI: No organomegaly, abdomen is soft and non-tender    Images:  @ENCIMAGES @  Labs:  Lab Results  Component Value Date   HGBA1C 5.6 06/06/2017   HGBA1C 5.9 (H) 02/16/2012   ESRSEDRATE 63 (H)  01/07/2018   ESRSEDRATE 89 (H) 06/05/2017   CRP 1.1 (H) 01/07/2018   CRP 0.9 01/07/2018   CRP 3.7 (H) 06/05/2017   LABURIC 8.2 (H) 04/21/2015   REPTSTATUS 04/05/2018 FINAL 04/02/2018   GRAMSTAIN  02/21/2018    RARE WBC PRESENT, PREDOMINANTLY PMN RARE GRAM POSITIVE COCCI IN PAIRS    CULT (A) 04/02/2018    >=100,000 COLONIES/mL ESCHERICHIA COLI Confirmed Extended Spectrum Beta-Lactamase Producer (ESBL).  In bloodstream infections from ESBL organisms, carbapenems are preferred over piperacillin/tazobactam. They are shown to have a lower risk of mortality. Performed at [facility], [address] [place], [place] [address]    LABORGA ESCHERICHIA COLI (A) 04/02/2018    Lab Results  Component Value Date   ALBUMIN 2.9 (L) 04/02/2018   ALBUMIN 2.5 (L) 01/08/2018   ALBUMIN 2.9 (L) 01/07/2018   PREALBUMIN 12.6 (L) 06/06/2017   LABURIC 8.2 (H) 04/21/2015    Neurologic: Patient does not have protective sensation bilateral lower extremities.   MUSCULOSKELETAL:   Skin: Patient has good pulses in both feet.  Examination the right foot she has superficial ulcers over the plantar aspect of the hindfoot as well as dorsally.  These do have good granulation tissue at the base of the wounds appear superficial with no clinical abscess or cellulitis.  Examination of the left  foot is more complex she has chronic ulcers beneath the first and fifth metatarsal head.  The ulcer beneath the left great toe was debrided of skin and soft tissue there was a superficial fluid collection that was completely decompressed.  The ulcer does probe down to bone consistent with chronic osteomyelitis.  There is no ascending cellulitis there is no abscess.    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  Plan: Plan: Would continue with the foam protective boots continue with the current wound care.  Anticipate patient will need a first ray amputation on the left when she is stable, but that is not a priority at this time, the wound is open decompressed and low risk for systemic infection.  Thank you for the consult and the opportunity to see Ms. Gaby  Aldean Baker, MD [facility] [phone] 8:58 AM

## 2018-04-07 ENCOUNTER — Inpatient Hospital Stay (HOSPITAL_COMMUNITY): Payer: PPO

## 2018-04-07 ENCOUNTER — Other Ambulatory Visit (HOSPITAL_COMMUNITY): Payer: PPO

## 2018-04-07 DIAGNOSIS — I351 Nonrheumatic aortic (valve) insufficiency: Secondary | ICD-10-CM

## 2018-04-07 DIAGNOSIS — B9629 Other Escherichia coli [E. coli] as the cause of diseases classified elsewhere: Secondary | ICD-10-CM

## 2018-04-07 DIAGNOSIS — I341 Nonrheumatic mitral (valve) prolapse: Secondary | ICD-10-CM

## 2018-04-07 LAB — CULTURE, BLOOD (ROUTINE X 2)
CULTURE: NO GROWTH
Culture: NO GROWTH

## 2018-04-07 LAB — GLUCOSE, CAPILLARY
GLUCOSE-CAPILLARY: 100 mg/dL — AB (ref 70–99)
GLUCOSE-CAPILLARY: 98 mg/dL (ref 70–99)
GLUCOSE-CAPILLARY: 100 mg/dL — AB (ref 70–99)
Glucose-Capillary: 120 mg/dL — ABNORMAL HIGH (ref 70–99)

## 2018-04-07 LAB — ECHOCARDIOGRAM COMPLETE
Height: 65 in
Weight: 3650.82 oz

## 2018-04-07 MED ORDER — HYDRALAZINE HCL 20 MG/ML IJ SOLN: 10.0000 mg | INTRAMUSCULAR | Status: DC | PRN

## 2018-04-07 MED ORDER — SACCHAROMYCES BOULARDII 250 MG PO CAPS
250.0000 mg | ORAL_CAPSULE | Freq: Two times a day (BID) | ORAL | Status: DC
  Administered 2018-04-07 – 2018-04-14 (×15): 250 mg via ORAL
  Filled 2018-04-07 (×14): qty 1

## 2018-04-07 MED ORDER — LOPERAMIDE HCL 2 MG PO CAPS
4.0000 mg | ORAL_CAPSULE | Freq: Once | ORAL | Status: AC
  Administered 2018-04-07: 4 mg via ORAL
  Filled 2018-04-07: qty 2

## 2018-04-07 MED ORDER — CARVEDILOL 12.5 MG PO TABS
25.0000 mg | ORAL_TABLET | Freq: Two times a day (BID) | ORAL | Status: DC
  Administered 2018-04-07 – 2018-04-14 (×14): 25 mg via ORAL
  Filled 2018-04-07 (×14): qty 2

## 2018-04-07 MED ORDER — ACETAMINOPHEN 325 MG PO TABS: 650.0000 mg | ORAL_TABLET | Freq: Four times a day (QID) | ORAL | Status: DC | PRN

## 2018-04-07 MED ORDER — METOPROLOL TARTRATE 5 MG/5ML IV SOLN: 5.0000 mg | Freq: Four times a day (QID) | INTRAVENOUS | Status: DC | PRN

## 2018-04-07 MED ORDER — ASPIRIN 81 MG PO CHEW
81.0000 mg | CHEWABLE_TABLET | Freq: Every day | ORAL | Status: DC
  Administered 2018-04-08 – 2018-04-09 (×2): 81 mg via ORAL
  Filled 2018-04-07 (×2): qty 1

## 2018-04-07 MED ORDER — PRO-STAT SUGAR FREE PO LIQD
30.0000 mL | Freq: Three times a day (TID) | ORAL | Status: DC
  Administered 2018-04-07 – 2018-04-13 (×19): 30 mL via ORAL
  Filled 2018-04-07 (×22): qty 30

## 2018-04-07 MED ORDER — METOPROLOL TARTRATE 5 MG/5ML IV SOLN
5.0000 mg | Freq: Once | INTRAVENOUS | Status: AC
  Administered 2018-04-07: 5 mg via INTRAVENOUS
  Filled 2018-04-07: qty 5

## 2018-04-07 NOTE — Evaluation (Signed)
Physical Therapy Evaluation Patient Details Name: [name] MRN: [id number] Yvette Sh[birth year]OB: [birth year] Today's Date: 04/07/2018   History of Present Illness  67 y.o. female found on ground by home health aide, admitted MRI consistent with PRES. PMH includes: A fib, OSA, neuropathy, ongoing foot wound/ulcers., Obesity, HTN, urinary incontinence, B TKA.      Clinical Impression  Pt admitted with above diagnosis. Pt currently with functional limitations due to the deficits listed below (see PT Problem List). PTA pt reporting live alone independent with mobility. Pt lethargic with slow processing and intermittently following commands during visit. Limited to bed mobility, unable to clear bottom from bed with max A and use of RW. [facility] Pt will benefit from skilled PT to increase their independence and safety with mobility to allow discharge to the venue listed below.       Follow Up Recommendations SNF;Supervision/Assistance - 24 hour    Equipment Recommendations  (TBD next venue)    Recommendations for Other Services OT consult     Precautions / Restrictions Precautions Precautions: Fall      Mobility  Bed Mobility Overal bed mobility: Needs Assistance Bed Mobility: Supine to Sit     Supine to sit: Mod assist     General bed mobility comments: mod A to assist pt trunk to sitting.   Transfers Overall transfer level: Needs assistance   Transfers: Sit to/from Stand Sit to Stand: Total assist;Max assist         General transfer comment: Max A, pt unable to clear bottom from bed. will re-attempt with more assistance next session. RN notified of lift dependency at this time.   Ambulation/Gait             General Gait Details: unable this visit  Stairs            Wheelchair Mobility    Modified Rankin (Stroke Patients Only)       Balance Overall balance assessment: Needs assistance   Sitting balance-Leahy Scale: Fair                                        Pertinent Vitals/Pain Pain Assessment: No/denies pain    Home Living Family/patient expects to be discharged to:: Skilled nursing facility Living Arrangements: Alone Available Help at Discharge: Friend(s);Available PRN/intermittently Type of Home: House Home Access: Ramped entrance     Home Layout: One level Home Equipment: Bedside commode;Shower seat;Grab bars - tub/shower;Walker - 2 wheels;Walker - 4 wheels      Prior Function Level of Independence: Independent with assistive device(s)         Comments: amb with cane     Hand Dominance        Extremity/Trunk Assessment   Upper Extremity Assessment Upper Extremity Assessment: Generalized weakness    Lower Extremity Assessment Lower Extremity Assessment: Generalized weakness    Cervical / Trunk Assessment Cervical / Trunk Assessment: (obesity)  Communication   Communication: No difficulties  Cognition Arousal/Alertness: Lethargic Behavior During Therapy: Flat affect Overall Cognitive Status: No family/caregiver present to determine baseline cognitive functioning                                 General Comments: Patient with AO to person place and situation,  word finding difficulty, following commands 50% of time, slow processing, flat  affect      General Comments      Exercises General Exercises - Lower Extremity Long Arc Quad: 10 reps   Assessment/Plan    PT Assessment Patient needs continued PT services  PT Problem List Decreased strength;Decreased activity tolerance;Decreased range of motion;Decreased balance;Decreased mobility;Decreased coordination;Decreased safety awareness;Decreased knowledge of precautions       PT Treatment Interventions DME instruction;Gait training;Stair training;Functional mobility training;Therapeutic activities;Balance training;Therapeutic exercise    PT Goals (Current goals can be found in the Care Plan  section)  Acute Rehab PT Goals Patient Stated Goal: to go home PT Goal Formulation: With patient Time For Goal Achievement: 04/14/18 Potential to Achieve Goals: Fair    Frequency Min 3X/week   Barriers to discharge Decreased caregiver support lives alone    Co-evaluation               AM-PAC PT "6 Clicks" Daily Activity  Outcome Measure Difficulty turning over in bed (including adjusting bedclothes, sheets and blankets)?: Unable Difficulty moving from lying on back to sitting on the side of the bed? : Unable Difficulty sitting down on and standing up from a chair with arms (e.g., wheelchair, bedside commode, etc,.)?: Unable Help needed moving to and from a bed to chair (including a wheelchair)?: Total Help needed walking in hospital room?: Total Help needed climbing 3-5 steps with a railing? : Total 6 Click Score: 6    End of Session Equipment Utilized During Treatment: Gait belt Activity Tolerance: Patient tolerated treatment well Patient left: in bed;with call bell/phone within reach;with nursing/sitter in room Nurse Communication: Mobility status PT Visit Diagnosis: Unsteadiness on feet (R26.81);Muscle weakness (generalized) (M62.81)    Time: 1540-1610 PT Time Calculation (min) (ACUTE ONLY): 30 min   Charges:     PT Treatments $Therapeutic Activity: 8-22 mins   PT G Codes:       Etta Grandchild, PT, DPT Acute Rehab Services Pager: [phone]    Etta Grandchild 04/07/2018, 4:41 PM

## 2018-04-07 NOTE — Progress Notes (Signed)
  Echocardiogram 2D Echocardiogram has been performed.  [name] 04/07/2018, 3:38 PM

## 2018-04-07 NOTE — Progress Notes (Signed)
Pt with A. Fib rhythm with HR fluctuating 116-140s, MD called with order of lopressor once which brings down HR some. HR fluctuates again up to 140s, MD order to give coreg 8am dose now.

## 2018-04-07 NOTE — Progress Notes (Addendum)
Subjective: Patient seen in room, awake alert and oriented x3, with intermittent episodes of mild  Encephalopathy with word finding difficulties.  Patient following commands, albeit slowly.  She denies headaches or dizziness and currently is in no acute distress.  Exam: Vitals:   04/07/18 0746 04/07/18 1216  BP: (!) 157/102 (!) 146/116  Pulse: (!) 121 85  Resp: 15 18  Temp: 98.4 F (36.9 C) 98.5 F (36.9 C)  SpO2: 97% 97%    Physical Exam  HEENT-  Normocephalic, no lesions, without obvious abnormality.  Normal external eye and conjunctiva.   Cardiovascular-irregular heart rhythm, noted A. fib on monitor Lungs-no rhonchi or wheezing noted, diminished in the bases  abdomen- All 4 quadrants palpated and nontender Musculoskeletal-, bilateral lower extremity edema Skin-multiple bruising bilateral upper and lower extremities, feet in large dressings and in [place]  Neuro:  Mental Status: Alert, oriented to self place and time, patient with mild encephalopathy noted in conversation, with intermittent word finding difficulties.  Speech is slow, but is fluent without evidence of aphasia  Able to follow 3 step commands without difficulty. Cranial Nerves: II:  Visual fields grossly normal,  III,IV, VI: ptosis not present, extra-ocular motions intact bilaterally pupils equal, round, reactive to light and accommodation V,VII: smile symmetric, facial light touch sensation normal bilaterally VIII: hearing intact to voice IX,X: uvula rises symmetrically XI: bilateral shoulder shrug XII: midline tongue extension Motor: Patient moves upper extremities equally, although slow, effort related weakness in bilateral lower extremities, unable to raise bilateral lower extremities off bed Tone and bulk:normal tone throughout; no atrophy noted Sensory: Intact in upper extremities, slightly diminished in lower extremities Deep Tendon Reflexes: Muted Plantars: Unable to be done, wrapped in dressing and in   Unna boots Cerebellar: Slowed  finger-to-nose, Gait: Not tested    Medications:  . amiodarone  200 mg Oral BID  . [START ON 04/08/2018] aspirin  81 mg Oral Daily  . carvedilol  25 mg Oral BID WC  . chlorhexidine  15 mL Mouth Rinse BID  . feeding supplement (PRO-STAT SUGAR FREE 64)  30 mL Oral TID  . loperamide  4 mg Oral Once  . mouth rinse  15 mL Mouth Rinse q12n4p  . saccharomyces boulardii  250 mg Oral BID    Pertinent Labs/Diagnostics:   No results found.    Assessment:   1. Posterior reversible encephalopathy syndrome. She is now with improving encephalopathy -likely multifactorial due to toxic/metabolic encephalopathy LTM EEG -with patterns consistent with severe diffuse cerebral disturbance which can be seen in toxic and metabolic encephalopathies, the discharges were sharply contoured but did not show definite ictal evolution.  With improvement in mentation and encephalopathy, neuro will sign off 2.  A. fib with RVR 3.  Bilateral feet ulcers with metatarsal osteomyelitis 4.  ESBL UTI 5. OSA   Recommendations: -Maintain normotensive blood pressure with SBP less than 140 -Continue aggressive management of infectious and metabolic comorbidities -Neuro to Sign off   Noralee Spaceearl Nyanor DNP Neuro-hospitalist Team [phone] 04/07/2018, 1:30 PM  I have seen the patient reviewed the above note.  She continues to be greatly improved, I suspect that this was pres as well as septic encephalopathy which is improving.  No further recommendations other than BP control and treatment of infection.  Please call with further questions or concerns.  Ritta SlotMcNeill Somaya Grassi, MD [facility] [phone]  If 7pm- 7am, please page neurology on call as listed in AMION.

## 2018-04-07 NOTE — Progress Notes (Addendum)
[facility] PROGRESS NOTE  [name] WUJ:[id number] DOB: 1950/11/24 DOA: 04/02/2018  PCP: Jarome Matin, MD  Brief History/Interval Summary: 67 year old female with PMH significant for Atrial fib on Eliquis, OSA, and neuropathy. Her recent medical course has been complicated by ongoing foot wounds/ulcers. She was admitted for this back in April of this year at what time she was found to have and abscess on the R foot and underwent surgical I&D. Cultures were negative. She was treated with an extended course of IV antibiotics, Then off and on oral antibiotics. She has been followed by Dr. Ninetta Lights in the ID clinic. Also followed by Dr. Victorino Dike with [facility] who felt nothing to do surgically in May.  Most recently she was started on Doxycyline 6/21 at ortho clinic as she had continued discharge. ID felt she may need further MRI.   Then on  7/8 she was found down by home health aide. She was last known well Friday (7/5) afternoon. In the ED she was alert, but only intermittently answering yes/no questions. Sent for CT scan which was concerning for infarct vs PRES. Her BP was borderline high, but diastolic notably high. She was started on cleviprex infusion. She had MRI, which was more consistent with PRES. She was initially admitted to the intensive care unit.  She was stabilized and then transferred to the floor.  Reason for Visit: PRES.  Consultants: Infectious disease.  Orthopedics.  Neurology   STUDIES:  CT head 7/8 > Bilateral occipital lobe/PCA territory acute infarcts versus PRES. RIGHT occipital lobe petechial hemorrhage. Small potentially acute LEFT MCA territory infarct or PRES. 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 Moderate parenchymal brain volume loss. Moderate chronic small vessel ischemic changes. MRI head 7/8 > T2 hyperintense signal abnormality involving cortex and subcortical white matter in a posterior distribution.  Scattered cortical reduced diffusion. Findings are consistent with PRES and atypical for ischemia. Xray R foot 7/8 > No acute bone abnormality. No radiographic evidence suggestive of Osteomyelitis. Soft tissue abscess. Xray R foot 7/8 > No acute abnormalities. Degenerative changes as described. Chronic soft tissue swelling lateral to the fifth MTP joint.  CULTURES: Blood 7/8 >> Urine 7/8 >>100k gnr>> ESBL E. Coli   ANTIBIOTICS: Aztreonam 7/8 >7/9 Vancomycin 7/8 >7/11 Levofloxacin 7/8 >7/9 Cefepime 7/9>>7/11 Meropenem 7/11>>  SIGNIFICANT EVENTS: 7/8 admit 7/9 ID consulted 7/10 MRI of the brain>> PRES  LINES/TUBES: Foley cath 7/10>> NG tube 7/11>>7/12  Subjective/Interval History: Patient noted to be distracted.  Denies any complaints.  ROS: Denies any shortness of breath, chest pain.  No nausea vomiting.  Objective:  Vital Signs  Vitals:   04/06/18 1944 04/06/18 2334 04/07/18 0400 04/07/18 0746  BP: 119/88 (!) 149/99 (!) 146/98 (!) 157/102  Pulse: 69 (!) 105 (!) 116 (!) 121  Resp: 16 20 18 15   Temp: (!) 97.5 F (36.4 C) 98.6 F (37 C) 98.2 F (36.8 C) 98.4 F (36.9 C)  TempSrc: Oral Oral Oral Oral  SpO2: 98% 97% 100% 97%  Weight:   103.5 kg (228 lb 2.8 oz)   Height:        Intake/Output Summary (Last 24 hours) at 04/07/2018 1200 Last data filed at 04/07/2018 0900 Gross per 24 hour  Intake 140 ml  Output 401 ml  Net -261 ml   Filed Weights   04/05/18 0356 04/06/18 0100 04/07/18 0400  Weight: 100.5 kg (221 lb 9 oz) 101.8 kg (224 lb 6.9 oz) 103.5 kg (228 lb  2.8 oz)    General appearance: alert, cooperative, appears stated age and no distress Head: Normocephalic, without obvious abnormality, atraumatic Resp: clear to auscultation bilaterally Cardio: regular rate and rhythm, S1, S2 normal, no murmur, click, rub or gallop GI: soft, non-tender; bowel sounds normal; no masses,  no organomegaly Extremities: Both feet are covered in dressing. Neurologic: She  is alert.  Very slow to respond.  She knew she was in [facility] hospital.  She could tell me the year but not the month.  Oriented to person.  No obvious focal neurological deficits.  Lab Results:  Data Reviewed: I have personally reviewed following labs and imaging studies  CBC: Recent Labs  Lab 04/02/18 1634 04/03/18 0220 04/04/18 0734 04/06/18 0237  WBC 13.5* 12.5* 8.8 8.7  NEUTROABS 11.1*  --  6.5  --   HGB 12.9 11.0* 10.8* 10.4*  HCT 43.4 36.6 35.8* 35.1*  MCV 84.8 83.4 83.4 84.0  PLT 530* 469* 340 324    Basic Metabolic Panel: Recent Labs  Lab 04/03/18 0007 04/03/18 0220 04/04/18 0734 04/05/18 0436 04/06/18 0237 04/06/18 1838  NA 141 142 136 133* 137 135  K 4.1 3.8 3.2* 3.1* 3.1* 3.5  CL 113* 109 96* 95* 100 102  CO2 20* 22 26 25 27 24   GLUCOSE 110* 104* 109* 118* 111* 121*  BUN 32* 28* 14 13 24* 20  CREATININE 1.08* 1.07* 0.96 0.94 1.04* 1.07*  CALCIUM 8.2* 8.3* 8.3* 8.3* 8.2* 8.3*  MG 1.7 1.7 1.6* 1.8 2.0  --   PHOS  --  2.2* 2.3* 2.8 3.1  --     GFR: Estimated Creatinine Clearance: 61.7 mL/min (A) (by C-G formula based on SCr of 1.07 mg/dL (H)).  Liver Function Tests: Recent Labs  Lab 04/02/18 1634  AST 74*  ALT 42  ALKPHOS 138*  BILITOT 0.9  PROT 8.2*  ALBUMIN 2.9*    Recent Labs  Lab 04/02/18 2206  LIPASE 43   Recent Labs  Lab 04/02/18 1634  AMMONIA 21    Coagulation Profile: Recent Labs  Lab 04/02/18 2206  INR 1.13    Cardiac Enzymes: Recent Labs  Lab 04/02/18 1634 04/03/18 0007 04/03/18 0220 04/03/18 1049  CKTOTAL 219  --   --   --   TROPONINI  --  2.15* 1.84* 1.11*    CBG: Recent Labs  Lab 04/06/18 1556 04/06/18 2023 04/06/18 2332 04/07/18 0358 04/07/18 0743  GLUCAP 96 102* 112* 100* 98     Recent Results (from the past 240 hour(s))  Blood Culture (routine x 2)     Status: None (Preliminary result)   Collection Time: 04/02/18  4:17 PM  Result Value Ref Range Status   Specimen Description BLOOD RIGHT  ANTECUBITAL  Final   Special Requests   Final    BOTTLES DRAWN AEROBIC ONLY Blood Culture results may not be optimal due to an inadequate volume of blood received in culture bottles   Culture   Final    NO GROWTH 4 DAYS Performed at [facility], [address] [place], [place] [address]    Report Status PENDING  Incomplete  Culture, blood (Routine X 2) w Reflex to ID Panel     Status: None (Preliminary result)   Collection Time: 04/02/18  8:21 PM  Result Value Ref Range Status   Specimen Description BLOOD LEFT HAND  Final   Special Requests   Final    BOTTLES DRAWN AEROBIC AND ANAEROBIC Blood Culture results may not be optimal  due to an inadequate volume of blood received in culture bottles   Culture   Final    NO GROWTH 4 DAYS Performed at [facility], [address] [place], [place] [address]    Report Status PENDING  Incomplete  Urine culture     Status: Abnormal   Collection Time: 04/02/18  8:57 PM  Result Value Ref Range Status   Specimen Description URINE, CATHETERIZED  Final   Special Requests NONE  Final   Culture (A)  Final    >=100,000 COLONIES/mL ESCHERICHIA COLI Confirmed Extended Spectrum Beta-Lactamase Producer (ESBL).  In bloodstream infections from ESBL organisms, carbapenems are preferred over piperacillin/tazobactam. They are shown to have a lower risk of mortality. Performed at [facility], [address] [place], [place] [address]    Report Status 04/05/2018 FINAL  Final   Organism ID, Bacteria ESCHERICHIA COLI (A)  Final      Susceptibility   Escherichia coli - MIC*    AMPICILLIN >=32 RESISTANT Resistant     CEFAZOLIN >=64 RESISTANT Resistant     CEFTRIAXONE >=64 RESISTANT Resistant     CIPROFLOXACIN >=4 RESISTANT Resistant     GENTAMICIN >=16 RESISTANT Resistant     IMIPENEM <=0.25 SENSITIVE Sensitive     NITROFURANTOIN <=16 SENSITIVE Sensitive     TRIMETH/SULFA <=20 SENSITIVE Sensitive     AMPICILLIN/SULBACTAM >=32 RESISTANT  Resistant     PIP/TAZO <=4 SENSITIVE Sensitive     Extended ESBL POSITIVE Resistant     * >=100,000 COLONIES/mL ESCHERICHIA COLI  MRSA PCR Screening     Status: None   Collection Time: 04/02/18 11:02 PM  Result Value Ref Range Status   MRSA by PCR NEGATIVE NEGATIVE Final    Comment:        The GeneXpert MRSA Assay (FDA approved for NASAL specimens only), is one component of a comprehensive MRSA colonization surveillance program. It is not intended to diagnose MRSA infection nor to guide or monitor treatment for MRSA infections. Performed at [facility], [address] [place], [place] [address]       Radiology Studies: No results found.   Medications:  Scheduled: . amiodarone  200 mg Oral BID  . aspirin  81 mg Per Tube Daily  . carvedilol  12.5 mg Per NG tube BID WC  . chlorhexidine  15 mL Mouth Rinse BID  . feeding supplement (PRO-STAT SUGAR FREE 64)  30 mL Per Tube TID  . mouth rinse  15 mL Mouth Rinse q12n4p   Continuous: . sodium chloride 20 mL/hr at 04/05/18 1948  . feeding supplement (JEVITY 1.2 CAL) Stopped (04/05/18 2300)  . meropenem (MERREM) IV 1 g (04/07/18 0519)   EXB:MWUXLKPRN:sodium chloride, acetaminophen, hydrALAZINE, ondansetron (ZOFRAN) IV  Assessment/Plan:    Acute metabolic encephalopathy Thought to be secondary to PRES. Seen by neurology.  No seizure activity noted on continuous EEG.  Mental status appears to be improving slowly.  Continue to monitor.  Petechial hemorrhage in right occipital lobe noted on imaging studies.  Anticoagulation on hold.  Atrial fibrillation with RVR Heart rate is poorly controlled.  Patient noted to be on carvedilol.  May have to go up on the dose.  Continue intravenous metoprolol as needed.  Patient also on oral amiodarone.  Holding anticoagulation as there was some concern for hemorrhage on the neuroimaging studies.  Looks like patient was on Tikosyn as well which is held for now.  Will resume once patient is back on  anticoagulation.  Bilateral feet ulcers with metatarsal osteomyelitis Orthopedics has seen the patient.  MRI has been ordered.  Will need surgical intervention at some point in time.  Patient is on meropenem.  Infectious disease is following as well.  ESBL E. coli UTI On meropenem.  Discontinue Foley catheter.  Demand ischemia Elevated troponin noted.  Troponin peaked at 2.15.  Thought to be due to demand ischemia.  Patient denies any chest pain currently.  And echocardiogram.  One will be ordered.  Acute renal failure Resolved  Obstructive sleep apnea Unclear if she is on CPAP.  Continue to monitor.  Normocytic anemia Check anemia panel.  DVT Prophylaxis: SCDs    Code Status: Full Code Family Communication: Discussed with the patient.  No family at bedside. Disposition Plan: Management as outlined above.  Start mobilizing.    LOS: 5 days   Osvaldo Shipper  [facility] Pager [phone] 04/07/2018, 12:00 PM  If 7PM-7AM, please contact night-coverage at [email or web address], password [facility]

## 2018-04-08 ENCOUNTER — Inpatient Hospital Stay (HOSPITAL_COMMUNITY): Payer: PPO

## 2018-04-08 DIAGNOSIS — D649 Anemia, unspecified: Secondary | ICD-10-CM

## 2018-04-08 DIAGNOSIS — I248 Other forms of acute ischemic heart disease: Secondary | ICD-10-CM

## 2018-04-08 LAB — CBC
HCT: 33.7 % — ABNORMAL LOW (ref 36.0–46.0)
Hemoglobin: 10 g/dL — ABNORMAL LOW (ref 12.0–15.0)
MCH: 24.9 pg — AB (ref 26.0–34.0)
MCHC: 29.7 g/dL — ABNORMAL LOW (ref 30.0–36.0)
MCV: 84 fL (ref 78.0–100.0)
PLATELETS: 345 10*3/uL (ref 150–400)
RBC: 4.01 MIL/uL (ref 3.87–5.11)
RDW: 17.8 % — AB (ref 11.5–15.5)
WBC: 7.9 10*3/uL (ref 4.0–10.5)

## 2018-04-08 LAB — RETICULOCYTES
RBC.: 4.01 MIL/uL (ref 3.87–5.11)
RETIC CT PCT: 2.1 % (ref 0.4–3.1)
Retic Count, Absolute: 84.2 10*3/uL (ref 19.0–186.0)

## 2018-04-08 LAB — BASIC METABOLIC PANEL
Anion gap: 7 (ref 5–15)
BUN: 19 mg/dL (ref 8–23)
CALCIUM: 8.3 mg/dL — AB (ref 8.9–10.3)
CO2: 25 mmol/L (ref 22–32)
Chloride: 103 mmol/L (ref 98–111)
Creatinine, Ser: 0.82 mg/dL (ref 0.44–1.00)
GFR calc Af Amer: >60 mL/min (ref >60)
GLUCOSE: 106 mg/dL — AB (ref 70–99)
Potassium: 3.4 mmol/L — ABNORMAL LOW (ref 3.5–5.1)
Sodium: 135 mmol/L (ref 135–145)

## 2018-04-08 LAB — GLUCOSE, CAPILLARY
GLUCOSE-CAPILLARY: 114 mg/dL — AB (ref 70–99)
GLUCOSE-CAPILLARY: 91 mg/dL (ref 70–99)
Glucose-Capillary: 110 mg/dL — ABNORMAL HIGH (ref 70–99)
Glucose-Capillary: 97 mg/dL (ref 70–99)
Glucose-Capillary: 102 mg/dL — ABNORMAL HIGH (ref 70–99)
Glucose-Capillary: 105 mg/dL — ABNORMAL HIGH (ref 70–99)

## 2018-04-08 LAB — IRON AND TIBC
Iron: 19 ug/dL — ABNORMAL LOW (ref 28–170)
Saturation Ratios: 10 % — ABNORMAL LOW (ref 10.4–31.8)
TIBC: 190 ug/dL — AB (ref 250–450)
UIBC: 171 ug/dL

## 2018-04-08 LAB — FERRITIN: FERRITIN: 32 ng/mL (ref 11–307)

## 2018-04-08 LAB — VITAMIN B12: Vitamin B-12: 371 pg/mL (ref 180–914)

## 2018-04-08 LAB — FOLATE: Folate: 6.1 ng/mL (ref >5.9)

## 2018-04-08 MED ORDER — LOPERAMIDE HCL 2 MG PO CAPS
4.0000 mg | ORAL_CAPSULE | Freq: Once | ORAL | Status: AC
  Administered 2018-04-08: 4 mg via ORAL
  Filled 2018-04-08: qty 2

## 2018-04-08 MED ORDER — TAB-A-VITE/IRON PO TABS
1.0000 | ORAL_TABLET | Freq: Every day | ORAL | Status: DC
  Administered 2018-04-08 – 2018-04-14 (×7): 1 via ORAL
  Filled 2018-04-08 (×7): qty 1

## 2018-04-08 MED ORDER — POTASSIUM CHLORIDE CRYS ER 20 MEQ PO TBCR
40.0000 meq | EXTENDED_RELEASE_TABLET | Freq: Once | ORAL | Status: AC
  Administered 2018-04-08: 40 meq via ORAL
  Filled 2018-04-08: qty 2

## 2018-04-08 MED ORDER — GADOBENATE DIMEGLUMINE 529 MG/ML IV SOLN
20.0000 mL | Freq: Once | INTRAVENOUS | Status: AC | PRN
  Administered 2018-04-08: 20 mL via INTRAVENOUS

## 2018-04-08 NOTE — Evaluation (Signed)
Occupational Therapy Evaluation Patient Details Name: [name] MRN: [id number] DOB: Oct 10, 1950 Today's Date: 04/08/2018    History of Present Illness 67 y.o. female found on ground by home health aide, admitted MRI consistent with PRES. PMH includes: A fib, OSA, neuropathy, ongoing foot wound/ulcers., Obesity, HTN, urinary incontinence, B TKA.      Clinical Impression  Pt admitted with above. She demonstrates the below listed deficits and will benefit from continued OT to maximize safety and independence with BADLs.  Pt presents to OT with generalized weakness, decreased activity tolerance, impaired balance, impaired cognition.  She Is only able to demonstrate focused attention.  She is very easily distracted and noted to perseverate at times.  She requires total  A for all aspects of ADLs as she is unable to maintain sustained attention long enough to complete even the simplest tasks.   Per chart review, pt lived alone PTA, and was mod I with ADLs.  Recommend SNF.       Follow Up Recommendations  SNF    Equipment Recommendations  None recommended by OT    Recommendations for Other Services       Precautions / Restrictions        Mobility Bed Mobility Overal bed mobility: Needs Assistance Bed Mobility: Rolling Rolling: Mod assist            Transfers                      Balance                                           ADL either performed or assessed with clinical judgement   ADL Overall ADL's : Needs assistance/impaired Eating/Feeding: Total assistance;Bed level   Grooming: Wash/dry hands;Oral care;Wash/dry face;Brushing hair;Bed level Grooming Details (indicate cue type and reason): Pt unable to verbally identify a comb.  when asked what to do with it, she will move it toward her head, but does not make contact with her head.  When she was handed the comb, and told to comb her hair, she will move to her head, and make 1-2  attempts to comb a small section of hair before becoming distracted  Upper Body Bathing: Total assistance;Bed level   Lower Body Bathing: Total assistance;Bed level   Upper Body Dressing : Total assistance;Bed level   Lower Body Dressing: Total assistance;Bed level   Toilet Transfer: Total assistance Toilet Transfer Details (indicate cue type and reason): pt unable  Toileting- Clothing Manipulation and Hygiene: Total assistance;Bed level       Functional mobility during ADLs: Maximal assistance;Total assistance General ADL Comments: Pt unable to sustain attention to complete ADLs      Vision   Additional Comments: Pt unable to participate in formal visual assessment.  She is able to spontaneously read dry erase board in her room      Perception Perception Comments: will need further assessment    Praxis Praxis Praxis tested?: Deficits Deficits: Initiation;Perseveration    Pertinent Vitals/Pain Pain Assessment: No/denies pain     Hand Dominance Right   Extremity/Trunk Assessment Upper Extremity Assessment Upper Extremity Assessment: Generalized weakness   Lower Extremity Assessment Lower Extremity Assessment: Defer to PT evaluation       Communication Communication Communication: Expressive difficulties(minimal communication )   Cognition Arousal/Alertness: Awake/alert Behavior During Therapy: Flat affect Overall Cognitive Status: Impaired/Different  from baseline Area of Impairment: Orientation;Attention;Following commands;Problem solving                 Orientation Level: Disoriented to;Time;Situation Current Attention Level: Focused   Following Commands: Follows one step commands inconsistently;Follows one step commands with increased time     Problem Solving: Slow processing;Decreased initiation;Difficulty sequencing;Requires verbal cues;Requires tactile cues General Comments: Pt frequently distracted by visual objects in room.  Pt would attempt to  answer what year it was.  She would sat "20... the nurse is [facility]".   Perseveration noted.   Pt very slow to respond and to initiate any responses, but becomes distracted and unable to fully carry out many commands    General Comments       Exercises     Shoulder Instructions      Home Living Family/patient expects to be discharged to:: Skilled nursing facility Living Arrangements: Alone Available Help at Discharge: Friend(s);Available PRN/intermittently Type of Home: House Home Access: Ramped entrance     Home Layout: One level     Bathroom Shower/Tub: Producer, television/film/videoWalk-in shower   Bathroom Toilet: Handicapped height     Home Equipment: Bedside commode;Shower seat;Grab bars - tub/shower;Walker - 2 wheels;Walker - 4 wheels   Additional Comments: Info gleaned from previous admission as pt unable to provide info       Prior Functioning/Environment Level of Independence: Independent with assistive device(s)        Comments: amb with cane- info gleaned from previous admission as pt unable to provide this info        OT Problem List: Decreased strength;Decreased activity tolerance;Impaired balance (sitting and/or standing);Decreased cognition;Decreased knowledge of use of DME or AE;Decreased safety awareness;Impaired UE functional use      OT Treatment/Interventions: Self-care/ADL training;Neuromuscular education;DME and/or AE instruction;Therapeutic activities;Cognitive remediation/compensation;Visual/perceptual remediation/compensation;Patient/family education;Balance training    OT Goals(Current goals can be found in the care plan section) Acute Rehab OT Goals Patient Stated Goal: Pt unable to participate  OT Goal Formulation: Patient unable to participate in goal setting Time For Goal Achievement: 04/22/18 Potential to Achieve Goals: Good ADL Goals Pt Will Perform Eating: with supervision;with set-up;sitting Pt Will Perform Grooming: with min assist;sitting Pt Will Perform  Upper Body Bathing: with mod assist;sitting Pt Will Transfer to Toilet: with mod assist;squat pivot transfer;bedside commode Pt Will Perform Toileting - Clothing Manipulation and hygiene: with max assist;sit to/from stand Additional ADL Goal #1: Pt will sustain attention to familiar ADL task x 2 mins  OT Frequency: Min 2X/week   Barriers to D/C: Decreased caregiver support          Co-evaluation              AM-PAC PT "6 Clicks" Daily Activity     Outcome Measure Help from another person eating meals?: Total Help from another person taking care of personal grooming?: Total Help from another person toileting, which includes using toliet, bedpan, or urinal?: Total Help from another person bathing (including washing, rinsing, drying)?: Total Help from another person to put on and taking off regular upper body clothing?: Total Help from another person to put on and taking off regular lower body clothing?: Total 6 Click Score: 6   End of Session    Activity Tolerance: Patient tolerated treatment well Patient left: in bed;with call bell/phone within reach;with nursing/sitter in room  OT Visit Diagnosis: Unsteadiness on feet (R26.81);Muscle weakness (generalized) (M62.81)                Time: 1191-47821521-1532 OT Time  Calculation (min): 11 min Charges:    G-Codes:     [employer], OTR/L [phone]   Jeani Hawking M 04/08/2018, 6:30 PM

## 2018-04-08 NOTE — Progress Notes (Signed)
[facility] PROGRESS NOTE  Yvette ShutterCarolyn I Orozco JXB:[id number] DOB: [birth year] DOA: 04/02/2018  PCP: Jarome MatinPaterson, Daniel, MD  Brief History/Interval Summary: 67 year old female with PMH significant for Atrial fib on Eliquis, OSA, and neuropathy. Her recent medical course has been complicated by ongoing foot wounds/ulcers. She was admitted for this back in April of this year at what time she was found to have and abscess on the R foot and underwent surgical I&D. Cultures were negative. She was treated with an extended course of IV antibiotics, Then off and on oral antibiotics. She has been followed by Dr. Ninetta LightsHatcher in the ID clinic. Also [facility]ewitt [facility] who felt nothing to do surgically in May.  Most recently she was started on Doxycyline 6/21 at ortho clinic as she had continued discharge. ID felt she may need further MRI.   Then on  7/8 she was found down by home health aide. She was last known well Friday (7/5) afternoon. In the ED she was alert, but only intermittently answering yes/no questions. Sent for CT scan which was concerning for infarct vs PRES. Her BP was borderline high, but diastolic notably high. She was started on cleviprex infusion. She had MRI, which was more consistent with PRES. She was initially admitted to the intensive care unit.  She was stabilized and then transferred to the floor.  Reason for Visit: PRES.  Consultants: Infectious disease.  Orthopedics.  Neurology   STUDIES:  CT head 7/8 > Bilateral occipital lobe/PCA territory acute infarcts versus PRES. RIGHT occipital lobe petechial hemorrhage. Small potentially acute LEFT MCA territory infarct or PRES. 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 Moderate parenchymal brain volume loss. Moderate chronic small vessel ischemic changes. MRI head 7/8 > T2 hyperintense signal abnormality involving cortex and subcortical white matter in a posterior distribution.  Scattered cortical reduced diffusion. Findings are consistent with PRES and atypical for ischemia. Xray R foot 7/8 > No acute bone abnormality. No radiographic evidence suggestive of Osteomyelitis. Soft tissue abscess. Xray R foot 7/8 > No acute abnormalities. Degenerative changes as described. Chronic soft tissue swelling lateral to the fifth MTP joint.  Transthoracic echocardiogram Study Conclusions  - Left ventricle: The cavity size was normal. Wall thickness was   increased in a pattern of mild LVH. Systolic function was normal.   The estimated ejection fraction was in the range of 60% to 65%.   Wall motion was normal; there were no regional wall motion   abnormalities. The study was not technically sufficient to allow   evaluation of LV diastolic dysfunction due to atrial   fibrillation. - Aortic valve: Mildly calcified annulus. Trileaflet. There was   mild regurgitation. - Mitral valve: Mildly calcified annulus. There was mild   regurgitation. - Left atrium: The atrium was moderately dilated. - Tricuspid valve: There was trivial regurgitation. - Pericardium, extracardiac: A prominent pericardial fat pad was   present.   CULTURES: Blood 7/8 >> Urine 7/8 >>100k gnr>> ESBL E. Coli   ANTIBIOTICS: Aztreonam 7/8 >7/9 Vancomycin 7/8 >7/11 Levofloxacin 7/8 >7/9 Cefepime 7/9>>7/11 Meropenem 7/11>>  SIGNIFICANT EVENTS: 7/8 admit 7/9 ID consulted 7/10 MRI of the brain>> PRES  LINES/TUBES: Foley cath 7/10>> NG tube 7/11>>7/12  Subjective/Interval History: Patient denies any complaints.  No shortness of breath.  No chest pain.  ROS: Denies any nausea vomiting.  Objective:  Vital Signs  Vitals:   04/08/18 0016 04/08/18 0530 04/08/18 0800 04/08/18 1138  BP: (!) 131/99 Marland Kitchen(!)  144/108 (!) 174/114 (!) 150/99  Pulse: 87 (!) 110 (!) 102 88  Resp: 18 18 17 19   Temp: 98.4 F (36.9 C) 97.7 F (36.5 C) 97.8 F (36.6 C) 98.2 F (36.8 C)  TempSrc: Oral Oral Oral Oral    SpO2: 98% 97% 98% 98%  Weight:  102 kg (224 lb 13.9 oz)    Height:        Intake/Output Summary (Last 24 hours) at 04/08/2018 1205 Last data filed at 04/08/2018 0600 Gross per 24 hour  Intake 350 ml  Output 870 ml  Net -520 ml   Filed Weights   04/06/18 0100 04/07/18 0400 04/08/18 0530  Weight: 101.8 kg (224 lb 6.9 oz) 103.5 kg (228 lb 2.8 oz) 102 kg (224 lb 13.9 oz)    General appearance: Mental status has improved.  Still mildly distracted. Resp: Clear to auscultation bilaterally. Cardio: S1-S2 is normal regular.  No S3-4.  No rubs murmurs or bruit. GI: Abdomen is soft.  Nontender nondistended.  Bowel sounds are present.  No masses organomegaly Extremities: Both feet covered in dressing. Neurologic: Patient is awake alert.  Oriented to place year person.  No obvious focal neurological deficits.    Lab Results:  Data Reviewed: I have personally reviewed following labs and imaging studies  CBC: Recent Labs  Lab 04/02/18 1634 04/03/18 0220 04/04/18 0734 04/06/18 0237 04/08/18 0634  WBC 13.5* 12.5* 8.8 8.7 7.9  NEUTROABS 11.1*  --  6.5  --   --   HGB 12.9 11.0* 10.8* 10.4* 10.0*  HCT 43.4 36.6 35.8* 35.1* 33.7*  MCV 84.8 83.4 83.4 84.0 84.0  PLT 530* 469* 340 324 345    Basic Metabolic Panel: Recent Labs  Lab 04/03/18 0007 04/03/18 0220 04/04/18 0734 04/05/18 0436 04/06/18 0237 04/06/18 1838 04/08/18 0634  NA 141 142 136 133* 137 135 135  K 4.1 3.8 3.2* 3.1* 3.1* 3.5 3.4*  CL 113* 109 96* 95* 100 102 103  CO2 20* 22 26 25 27 24 25   GLUCOSE 110* 104* 109* 118* 111* 121* 106*  BUN 32* 28* 14 13 24* 20 19  CREATININE 1.08* 1.07* 0.96 0.94 1.04* 1.07* 0.82  CALCIUM 8.2* 8.3* 8.3* 8.3* 8.2* 8.3* 8.3*  MG 1.7 1.7 1.6* 1.8 2.0  --   --   PHOS  --  2.2* 2.3* 2.8 3.1  --   --     GFR: Estimated Creatinine Clearance: 79.9 mL/min (by C-G formula based on SCr of 0.82 mg/dL).  Liver Function Tests: Recent Labs  Lab 04/02/18 1634  AST 74*  ALT 42  ALKPHOS  138*  BILITOT 0.9  PROT 8.2*  ALBUMIN 2.9*    Recent Labs  Lab 04/02/18 2206  LIPASE 43   Recent Labs  Lab 04/02/18 1634  AMMONIA 21    Coagulation Profile: Recent Labs  Lab 04/02/18 2206  INR 1.13    Cardiac Enzymes: Recent Labs  Lab 04/02/18 1634 04/03/18 0007 04/03/18 0220 04/03/18 1049  CKTOTAL 219  --   --   --   TROPONINI  --  2.15* 1.84* 1.11*    CBG: Recent Labs  Lab 04/07/18 1619 04/08/18 0020 04/08/18 0528 04/08/18 0759 04/08/18 1138  GLUCAP 100* 114* 110* 97 102*     Recent Results (from the past 240 hour(s))  Blood Culture (routine x 2)     Status: None   Collection Time: 04/02/18  4:17 PM  Result Value Ref Range Status   Specimen Description BLOOD RIGHT ANTECUBITAL  Final   Special Requests   Final    BOTTLES DRAWN AEROBIC ONLY Blood Culture results may not be optimal due to an inadequate volume of blood received in culture bottles   Culture   Final    NO GROWTH 5 DAYS Performed at [facility], [address], [place], [place] [address]    Report Status 04/07/2018 FINAL  Final  Culture, blood (Routine X 2) w Reflex to ID Panel     Status: None   Collection Time: 04/02/18  8:21 PM  Result Value Ref Range Status   Specimen Description BLOOD LEFT HAND  Final   Special Requests   Final    BOTTLES DRAWN AEROBIC AND ANAEROBIC Blood Culture results may not be optimal due to an inadequate volume of blood received in culture bottles   Culture   Final    NO GROWTH 5 DAYS Performed at [facility], [address], [place], [place] [address]    Report Status 04/07/2018 FINAL  Final  Urine culture     Status: Abnormal   Collection Time: 04/02/18  8:57 PM  Result Value Ref Range Status   Specimen Description URINE, CATHETERIZED  Final   Special Requests NONE  Final   Culture (A)  Final    >=100,000 COLONIES/mL ESCHERICHIA COLI Confirmed Extended Spectrum Beta-Lactamase Producer (ESBL).  In bloodstream infections from ESBL  organisms, carbapenems are preferred over piperacillin/tazobactam. They are shown to have a lower risk of mortality. Performed at [facility], [address], [place], [place] [address]    Report Status 04/05/2018 FINAL  Final   Organism ID, Bacteria ESCHERICHIA COLI (A)  Final      Susceptibility   Escherichia coli - MIC*    AMPICILLIN >=32 RESISTANT Resistant     CEFAZOLIN >=64 RESISTANT Resistant     CEFTRIAXONE >=64 RESISTANT Resistant     CIPROFLOXACIN >=4 RESISTANT Resistant     GENTAMICIN >=16 RESISTANT Resistant     IMIPENEM <=0.25 SENSITIVE Sensitive     NITROFURANTOIN <=16 SENSITIVE Sensitive     TRIMETH/SULFA <=20 SENSITIVE Sensitive     AMPICILLIN/SULBACTAM >=32 RESISTANT Resistant     PIP/TAZO <=4 SENSITIVE Sensitive     Extended ESBL POSITIVE Resistant     * >=100,000 COLONIES/mL ESCHERICHIA COLI  MRSA PCR Screening     Status: None   Collection Time: 04/02/18 11:02 PM  Result Value Ref Range Status   MRSA by PCR NEGATIVE NEGATIVE Final    Comment:        The GeneXpert MRSA Assay (FDA approved for NASAL specimens only), is one component of a comprehensive MRSA colonization surveillance program. It is not intended to diagnose MRSA infection nor to guide or monitor treatment for MRSA infections. Performed at [facility], [address], [place], [place] [address]       Radiology Studies: Mr Foot Right W Wo Contrast  Result Date: 04/08/2018 CLINICAL DATA:  Diabetic foot ulcer at the base of the first toe. EXAM: MRI OF THE RIGHT FOREFOOT WITHOUT AND WITH CONTRAST TECHNIQUE: Multiplanar, multisequence MR imaging of the right forefoot was performed before and after the administration of intravenous contrast. CONTRAST:  20 cc MultiHance intravenous contrast. COMPARISON:  Right foot x-rays dated April 02, 2018. MRI right foot dated January 08, 2018. FINDINGS: Bones/Joint/Cartilage No marrow signal abnormality. No fracture. No joint effusion. Progressive  neuropathic arthropathy of the midfoot. 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 Ligaments Collateral  ligaments are intact. Muscles and Tendons Flexor, peroneal and extensor compartment tendons are intact. Diffuse fatty atrophy of the intrinsic muscles of the forefoot. Soft tissue Soft tissue ulceration at the plantar base of the first MTP joint with mild underlying phlegmonous change. Previously seen inflammatory changes in the first intermetatarsal space have resolved. No fluid collection or hematoma. No soft tissue mass. IMPRESSION: 1. Small soft tissue ulceration at the plantar base of the first MTP joint without evidence of underlying osteomyelitis or abscess. 2. Progressive neuropathic arthropathy of the midfoot. Electronically Signed   By: Obie Dredge M.D.   On: 04/08/2018 09:26   Mr Foot Left W Wo Contrast  Result Date: 04/08/2018 CLINICAL DATA:  Diabetic foot ulcer near the fifth MTP joint. EXAM: MRI OF THE LEFT FOREFOOT WITHOUT AND WITH CONTRAST TECHNIQUE: Multiplanar, multisequence MR imaging of the left forefoot was performed both before and after administration of intravenous contrast. CONTRAST:  20mL MULTIHANCE GADOBENATE DIMEGLUMINE 529 MG/ML IV SOLN COMPARISON:  Left foot x-rays dated April 02, 2018. Left foot MRI dated December 08, 2017. FINDINGS: Bones/Joint/Cartilage No marrow signal abnormality. No fracture. Progressive dorsal subluxation of the fifth proximal phalanx with respect to the fifth metatarsal head. Tiny focus of reactive marrow edema in the dorsal base of the fifth proximal phalanx. Small fifth MTP joint effusion. Unchanged hallux valgus deformity with severe first MTP joint osteoarthritis. Unchanged juxtacortical erosion along the medial aspect of the first metatarsal head, possibly secondary to gout. Moderate midfoot osteoarthritis, similar to prior study. Ligaments Collateral ligaments are intact. Muscles and Tendons Flexor, peroneal and extensor  compartment tendons are intact. Diffuse atrophy of the intrinsic muscles of the forefoot. Soft tissue Soft tissue ulceration at the plantar base of the fifth metatarsal head. No fluid collection or hematoma. No soft tissue mass. IMPRESSION: 1. Soft tissue ulceration at the plantar base of the fifth metatarsal head without evidence of osteomyelitis. 2. Small fifth MTP joint effusion is nonspecific, but may be reactive given progressive dorsal subluxation at the fifth MTP joint. Electronically Signed   By: Obie Dredge M.D.   On: 04/08/2018 09:12     Medications:  Scheduled: . amiodarone  200 mg Oral BID  . aspirin  81 mg Oral Daily  . carvedilol  25 mg Oral BID WC  . chlorhexidine  15 mL Mouth Rinse BID  . feeding supplement (PRO-STAT SUGAR FREE 64)  30 mL Oral TID  . mouth rinse  15 mL Mouth Rinse q12n4p  . saccharomyces boulardii  250 mg Oral BID   Continuous: . sodium chloride 20 mL/hr at 04/05/18 1948  . meropenem (MERREM) IV 1 g (04/08/18 0549)   ZOX:WRUEAV chloride, acetaminophen, hydrALAZINE, metoprolol tartrate, ondansetron (ZOFRAN) IV  Assessment/Plan:    Acute metabolic encephalopathy Thought to be secondary to PRES. Seen by neurology.  No seizure activity noted on continuous EEG.  Per neurology mental status has significantly improved.  She could be very close to her baseline.  Continue to monitor.  Petechial hemorrhage in right occipital lobe noted on imaging studies.  Anticoagulation was placed on hold.  Discussed with neurology yesterday.  Okay to resume now.  Atrial fibrillation with RVR Heart rate was poorly controlled yesterday.  Patient's carvedilol dose was increased.  Patient also on amiodarone.  Patient was on Tikosyn at home which was held at the time of admission.  Eliquis on hold due to petechial hemorrhage on CT scan.  Okay for neurology to resume.  However we will wait to see if  patient needs to undergo any surgery before resuming.  Heart rate seems to be  better controlled now.    Bilateral feet ulcers with metatarsal osteomyelitis Orthopedics has seen the patient.  MRI of both the right and left foot were performed.  No clear evidence of osteomyelitis was noted.  Nonspecific changes were seen.  Patient remains on meropenem.  Infectious disease is also following.  Will need to see if Dr. Lajoyce Corners plans to operate during this hospitalization.    ESBL E. coli UTI On meropenem.  Foley catheter discontinued.  Diarrhea Likely due to antibiotics.  Imodium.  Patient abdomen is benign.  Discontinue rectal tube if not having significant output.  Demand ischemia Elevated troponin noted.  Troponin peaked at 2.15.  Thought to be due to demand ischemia.  Patient denies any chest pain currently.  Echocardiogram shows normal systolic function without any wall motion abnormalities.  No further work-up at this time.  Acute renal failure Resolved  Obstructive sleep apnea Unclear if she is on CPAP.  Continue to monitor.  Normocytic anemia Anemia panel reviewed.  Ferritin 32.  Folate 6.1.  B12 371.  Hemoglobin is stable.  She will benefit from a multivitamin which will be started.  DVT Prophylaxis: SCDs    Code Status: Full Code Family Communication: Discussed with the patient.  No family at bedside. Disposition Plan: Skilled nursing facility recommended by physical therapy.  Wait on orthopedic input.  Will need to resume her anticoagulation if no surgery is planned.  We will also need to see antibiotic recommendations per ID.    LOS: 6 days   Osvaldo Shipper  [facility] Pager [phone] 04/08/2018, 12:05 PM  If 7PM-7AM, please contact night-coverage at [email or web address], password [facility]

## 2018-04-08 NOTE — Progress Notes (Signed)
Pharmacy Antibiotic Note  Yvette ShutterCarolyn I Orozco is a 67 y.o. female admitted on 04/02/2018 with sepsis. PMH includes atrial fibrillation, OSA, and neuropathy. Currently on meropenem to treat ESBL infection. WBC are WNL and patient is AFebrile.   Plan: Continue meropenem 1 gm IV Q 8 hours.  Meropenem scheduled end date: 7/15 Continue to monitor renal function and patient clinical status.   Height: 5\' 5"  (165.1 cm)(from admission 02/28/18) Weight: 224 lb 13.9 oz (102 kg) IBW/kg (Calculated) : 57  Temp (24hrs), Avg:98 F (36.7 C), Min:97.7 F (36.5 C), Max:98.4 F (36.9 C)  Recent Labs  Lab 04/02/18 1634 04/02/18 1647 04/02/18 2030 04/02/18 2215  04/03/18 0218 04/03/18 0220 04/03/18 0543 04/04/18 0734 04/05/18 0436 04/06/18 0237 04/06/18 1838 04/08/18 0634  WBC 13.5*  --   --   --   --   --  12.5*  --  8.8  --  8.7  --  7.9  CREATININE 1.40*  --   --   --    < >  --  1.07*  --  0.96 0.94 1.04* 1.07* 0.82  LATICACIDVEN  --  3.26* 1.97* 2.87*  --  2.1*  --  1.7  --   --   --   --   --    < > = values in this interval not displayed.    Estimated Creatinine Clearance: 79.9 mL/min (by C-G formula based on SCr of 0.82 mg/dL).    Allergies  Allergen Reactions  . Zolpidem Tartrate Other (See Comments)    Delusions, hallucinations  . Amoxicillin Rash and Other (See Comments)    Has patient had a PCN reaction causing immediate rash, facial/tongue/throat swelling, SOB or lightheadedness with hypotension: Yes Has patient had a PCN reaction causing severe rash involving mucus membranes or skin necrosis: No Has patient had a PCN reaction that required hospitalization: No Has patient had a PCN reaction occurring within the last 10 years: Yes If all of the above answers are "NO", then may proceed with Cephalosporin use.    Thank you for the consult. Please contact with questions.   Gwynneth AlbrightSara Rexford Prevo, Ilda BassetPharm D PGY1 Pharmacy Resident  Phone [phone] 04/08/2018   2:59 PM

## 2018-04-09 DIAGNOSIS — L02612 Cutaneous abscess of left foot: Secondary | ICD-10-CM

## 2018-04-09 LAB — GLUCOSE, CAPILLARY
GLUCOSE-CAPILLARY: 96 mg/dL (ref 70–99)
GLUCOSE-CAPILLARY: 104 mg/dL — AB (ref 70–99)
GLUCOSE-CAPILLARY: 95 mg/dL (ref 70–99)
GLUCOSE-CAPILLARY: 102 mg/dL — AB (ref 70–99)
Glucose-Capillary: 94 mg/dL (ref 70–99)
Glucose-Capillary: 98 mg/dL (ref 70–99)
Glucose-Capillary: 98 mg/dL (ref 70–99)

## 2018-04-09 MED ORDER — APIXABAN 5 MG PO TABS
5.0000 mg | ORAL_TABLET | Freq: Two times a day (BID) | ORAL | Status: DC
  Administered 2018-04-09 – 2018-04-14 (×11): 5 mg via ORAL
  Filled 2018-04-09 (×11): qty 1

## 2018-04-09 NOTE — Consult Note (Addendum)
Cardiology Consultation:   Patient ID: [name]; [id number]; [birth year]   Admit date: 04/02/2018 Date of Consult: 04/09/2018  Primary Care Provider: Jarome MatinPaterson, Daniel, MD Primary Cardiologist: Dr. Anne FuSkains Primary Electrophysiologist:  Dr. Ladona Ridgelaylor   Patient Profile:   [name] is a 67 y.o. female with a hx of PAFib on Tikosyn and Eliquis out patient, chronic venous insufficiency as well as PVD with h/o b/l pedal/foot wounds w/ hx of osteo, Charcot joint, HTN, hypertensive heart disease with hx of diastolic CHF, essential tremor, HLD  who is being seen today for the evaluation of AFib management at the request of Dr. Rito EhrlichKrishnan.  History of Present Illness:   [name] was admitted 04/02/18 after being found on the ground with AMS and having had loss of both bladder and bowels, she was found hypertensive in the ER treated with cleviprx, she arrived in ST though that evening devleoped AFib, had abnormal Trop felt 2/2 demand ischemia though initiated on heparin gtt (stopped 04/03/18), there was no reports of CP and TTE noted no WMA, no cardiology consult.  Ultimately diagnosed as PRES neuro on board recs tight BP control.  Her Eliquis held 2/2 petechial hemorrhage, her Tikosyn held at admission, though on 04/03/18 did get on amiodarone gtt for RVR for rate control with her coreg transitioned to 200mg  BID PO on 04/05/18.  She had initially minimal improvement/even some decline in her neuro status though finally did gain much improvement, neuro thoughts were PRES and septic vs toxic/metabolic encephalopathy (foot infection/UTI), ID on case recs meropenem for both.  Neuro and ID have signed off.  IM has asked that we provide input on her rate/rhythm management, and resumption of Tikosyn if indicated  Eliquis was resumed today (off since admission, hep gtt stopped 04/03/18) Rate controling agents: Coreg 25mg  BID Amiodarone 200mg  BID  LABS: K+ 3.4 BUN/Creat 0.82 Mag 2.0 (on 04/06/18) WBC  7.9 H/H 10/33 Plts 345  04/02/18 Trop 2.16, 2.18, 2.15, 1.84, 1.11 Lactic acid 3.26 K+ 4.0 BUN/Creat 37/1.40 AST 74 ALT 42 WBC 13.5 H.H 12/43 Plts 530   Past Medical History:  Diagnosis Date  . Anxiety   . 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  PT ALSO HAS LOWER BACK PAIN AT TIMES  . Depression   . GERD (gastroesophageal reflux disease)    RARE- OTC ANTACID IF NEEDED--PAST HX OF ULCER  . Headache    wakes up with headache   . Hyperlipidemia   . Hypertension   . Kidney stones    PT PASSED STONES--NO KNOWN STONES AT PRESENT TIMES  . Lumbago 10/02/2013  . Obesity   . OSA (obstructive sleep apnea) 12/30/2013   Very mild OSA with REM accentuation,  [facility] 11[facility]29-13 [facility], Dr Frances FurbishAthar .   Marland Kitchen. Periodic limb movement disorder (PLMD) 12/30/2013  . Restless leg syndrome   . Ulcer   . Urinary incontinence     Past Surgical History:  Procedure Laterality Date  . ABDOMINAL AORTOGRAM W/LOWER EXTREMITY N/A 10/10/2017   Procedure: ABDOMINAL AORTOGRAM W/LOWER EXTREMITY;  Surgeon: Nada LibmanBrabham, Vance W, MD;  Location: [facility];  Service: Cardiovascular;  Laterality: N/A;  . BILATERAL BUNIONECTOMY 1979    . BREAST SURGERY     biopsy   . COLON SURGERY    . PERIPHERAL VASCULAR INTERVENTION  10/10/2017   Procedure: PERIPHERAL VASCULAR INTERVENTION;  Surgeon: Nada LibmanBrabham, Vance W, MD;  Location: [facility];  Service: Cardiovascular;;  lt. Iliac  .  SMALL INTESTINE SURGERY    . SURGERY FOR BOWEL OBSTRUCTION AND REMOVAL OF PART OF STOMACH  IN 2000  2000  . TOTAL KNEE ARTHROPLASTY  02/16/2012   Procedure: TOTAL KNEE ARTHROPLASTY;  Surgeon: Javier Docker, MD;  Location: [facility] OR[facility];  Service: Orthopedics;  Laterality: Left;  . TOTAL KNEE ARTHROPLASTY Right 11/20/2014   Procedure: RIGHT TOTAL KNEE ARTHROPLASTY;  Surgeon: Javier Docker, MD;  Location: [facility];  Service: Orthopedics;  Laterality: Right;       Inpatient Medications: Scheduled Meds: . amiodarone  200 mg Oral  BID  . apixaban  5 mg Oral BID  . aspirin  81 mg Oral Daily  . carvedilol  25 mg Oral BID WC  . chlorhexidine  15 mL Mouth Rinse BID  . feeding supplement (PRO-STAT SUGAR FREE 64)  30 mL Oral TID  . mouth rinse  15 mL Mouth Rinse q12n4p  . multivitamins with iron  1 tablet Oral Daily  . saccharomyces boulardii  250 mg Oral BID   Continuous Infusions: . sodium chloride 20 mL/hr at 04/05/18 1948   PRN Meds: sodium chloride, acetaminophen, hydrALAZINE, metoprolol tartrate, ondansetron (ZOFRAN) IV  Allergies:    Allergies  Allergen Reactions  . Zolpidem Tartrate Other (See Comments)    Delusions, hallucinations  . Amoxicillin Rash and Other (See Comments)    Has patient had a PCN reaction causing immediate rash, facial/tongue/throat swelling, SOB or lightheadedness with hypotension: Yes Has patient had a PCN reaction causing severe rash involving mucus membranes or skin necrosis: No Has patient had a PCN reaction that required hospitalization: No Has patient had a PCN reaction occurring within the last 10 years: Yes If all of the above answers are "NO", then may proceed with Cephalosporin use.     Social History:   Social History   Socioeconomic History  . Marital status: Single    Spouse name: Not on file  . Number of children: 0  . Years of education: 37  . Highest education level: Not on file  Occupational History  . Not on file  Social Needs  . Financial resource strain: Not on file  . Food insecurity:    Worry: Not on file    Inability: Not on file  . Transportation needs:    Medical: Not on file    Non-medical: Not on file  Tobacco Use  . Smoking status: Former Smoker    Packs/day: 2.00    Years: 20.00    Pack years: 40.00    Types: Cigarettes    Last attempt to quit: 10/17/1988    Years since quitting: 29.4  . Smokeless tobacco: Never Used  Substance and Sexual Activity  . Alcohol use: No  . Drug use: No    Comment: Marijuana - 25 years ago  . Sexual  activity: Not on file  Lifestyle  . Physical activity:    Days per week: Not on file    Minutes per session: Not on file  . Stress: Not on file  Relationships  . Social connections:    Talks on phone: Not on file    Gets together: Not on file    Attends religious service: Not on file    Active member of club or organization: Not on file    Attends meetings of clubs or organizations: Not on file    Relationship status: Not on file  . Intimate partner violence:    Fear of current or ex partner: Not on file  Emotionally abused: Not on file    Physically abused: Not on file    Forced sexual activity: Not on file  Other Topics Concern  . Not on file  Social History Narrative   Patient is single and lives alone.   Patient is disabled.   Patient has a college education.   Patient is right-handed.   Patient drinks two cokes per day.    Family History:   Family History  Problem Relation Age of Onset  . Sleep apnea Father   . Hypertension Father   . Hypertension Mother   . Multiple sclerosis Sister   . Multiple sclerosis Sister      ROS:  Please see the history of present illness.  All other ROS reviewed and negative.     Physical Exam/Data:   Vitals:   04/08/18 2355 04/09/18 0343 04/09/18 0900 04/09/18 1132  BP: 133/89 (!) 144/91 (!) 157/90 (!) 156/100  Pulse: 81 88 91 94  Resp: 18 18 18 18   Temp: 98 F (36.7 C) 98.3 F (36.8 C) 97.7 F (36.5 C) 97.6 F (36.4 C)  TempSrc: Oral Oral Oral Oral  SpO2: 99% 99% 99% 98%  Weight:  225 lb 8.5 oz (102.3 kg)    Height:        Intake/Output Summary (Last 24 hours) at 04/09/2018 1143 Last data filed at 04/09/2018 0345 Gross per 24 hour  Intake 1164.03 ml  Output 1000 ml  Net 164.03 ml   Filed Weights   04/07/18 0400 04/08/18 0530 04/09/18 0343  Weight: 228 lb 2.8 oz (103.5 kg) 224 lb 13.9 oz (102 kg) 225 lb 8.5 oz (102.3 kg)   Body mass index is 37.53 kg/m.  General:  Well nourished, well developed, in no acute  distress HEENT: normal Lymph: no adenopathy Neck: no JVD Endocrine:  No thryomegaly Vascular: No carotid bruits Cardiac:   RRR; no murmurs, gallops or rubs, heart sounds are distant. Lungs:  CTA b/l, no wheezing, rhonchi or rales  Abd: soft, obese, nontender Ext: no edema Musculoskeletal:  No deformities, she has boots/dressings b/l LE Skin: warm and dry  Neuro:  Very slow to answer, no gross focal motor abnormalities noted Psych:  Difficult to assess  EKG:  The EKG was personally reviewed and demonstrates:   #1 04/02/18, ST 108bpm, no clear/significant ST/T changes #2 04/02/18, AFib w/RVR, 153bpm, ST depression V2-6 Today is AFib 107bpm, resolved ST changes, QTc Telemetry:  Telemetry was personally reviewed and demonstrates:   AFib 80's-low 100 range, generally 90's  Relevant CV Studies:  04/07/18: TTE Study Conclusions - Left ventricle: The cavity size was normal. Wall thickness was   increased in a pattern of mild LVH. Systolic function was normal.   The estimated ejection fraction was in the range of 60% to 65%.   Wall motion was normal; there were no regional wall motion   abnormalities. The study was not technically sufficient to allow   evaluation of LV diastolic dysfunction due to atrial   fibrillation. - Aortic valve: Mildly calcified annulus. Trileaflet. There was   mild regurgitation. - Mitral valve: Mildly calcified annulus. There was mild   regurgitation. - Left atrium: The atrium was moderately dilated. - Tricuspid valve: There was trivial regurgitation. - Pericardium, extracardiac: A prominent pericardial fat pad was   present.   Laboratory Data:  Chemistry Recent Labs  Lab 04/06/18 0237 04/06/18 1838 04/08/18 0634  NA 137 135 135  K 3.1* 3.5 3.4*  CL 100 102 103  CO2 27 24 25   GLUCOSE 111* 121* 106*  BUN 24* 20 19  CREATININE 1.04* 1.07* 0.82  CALCIUM 8.2* 8.3* 8.3*  GFRNONAA 55* 53* >60  GFRAA >60 >60 >60  ANIONGAP 10 9 7     Recent  Labs  Lab 04/02/18 1634  PROT 8.2*  ALBUMIN 2.9*  AST 74*  ALT 42  ALKPHOS 138*  BILITOT 0.9   Hematology Recent Labs  Lab 04/04/18 0734 04/06/18 0237 04/08/18 0634  WBC 8.8 8.7 7.9  RBC 4.29 4.18 4.01  4.01  HGB 10.8* 10.4* 10.0*  HCT 35.8* 35.1* 33.7*  MCV 83.4 84.0 84.0  MCH 25.2* 24.9* 24.9*  MCHC 30.2 29.6* 29.7*  RDW 18.2* 18.0* 17.8*  PLT 340 324 345   Cardiac Enzymes Recent Labs  Lab 04/03/18 0007 04/03/18 0220 04/03/18 1049  TROPONINI 2.15* 1.84* 1.11*    Recent Labs  Lab 04/02/18 1644 04/02/18 2028  TROPIPOC 2.16* 2.18*    BNPNo results for input(s): BNP, PROBNP in the last 168 hours.  DDimer No results for input(s): DDIMER in the last 168 hours.  Radiology/Studies:   Mr Foot Right W Wo Contrast Result Date: 04/08/2018 CLINICAL DATA:  Diabetic foot ulcer at the base of the first toe. EXAM: MRI OF THE RIGHT FOREFOOT WITHOUT AND WITH CONTRAST TECHNIQUE: Multiplanar, multisequence MR imaging of the right forefoot was performed before and after the administration of intravenous contrast. CONTRAST:  20 cc MultiHance intravenous contrast. COMPARISON:  Right foot x-rays dated April 02, 2018. MRI right foot dated January 08, 2018. FINDINGS: Bones/Joint/Cartilage No marrow signal abnormality. No fracture. No joint effusion. Progressive neuropathic arthropathy of the midfoot. 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 Ligaments Collateral ligaments are intact. Muscles and Tendons Flexor, peroneal and extensor compartment tendons are intact. Diffuse fatty atrophy of the intrinsic muscles of the forefoot. Soft tissue Soft tissue ulceration at the plantar base of the first MTP joint with mild underlying phlegmonous change. Previously seen inflammatory changes in the first intermetatarsal space have resolved. No fluid collection or hematoma. No soft tissue mass. IMPRESSION: 1. Small soft tissue ulceration at the plantar base of the first MTP joint without  evidence of underlying osteomyelitis or abscess. 2. Progressive neuropathic arthropathy of the midfoot. Electronically Signed   By: Obie Dredge M.D.   On: 04/08/2018 09:26    Mr Foot Left W Wo Contrast Result Date: 04/08/2018 CLINICAL DATA:  Diabetic foot ulcer near the fifth MTP joint. EXAM: MRI OF THE LEFT FOREFOOT WITHOUT AND WITH CONTRAST TECHNIQUE: Multiplanar, multisequence MR imaging of the left forefoot was performed both before and after administration of intravenous contrast. CONTRAST:  20mL MULTIHANCE GADOBENATE DIMEGLUMINE 529 MG/ML IV SOLN COMPARISON:  Left foot x-rays dated April 02, 2018. Left foot MRI dated December 08, 2017. FINDINGS: Bones/Joint/Cartilage No marrow signal abnormality. No fracture. Progressive dorsal subluxation of the fifth proximal phalanx with respect to the fifth metatarsal head. Tiny focus of reactive marrow edema in the dorsal base of the fifth proximal phalanx. Small fifth MTP joint effusion. Unchanged hallux valgus deformity with severe first MTP joint osteoarthritis. Unchanged juxtacortical erosion along the medial aspect of the first metatarsal head, possibly secondary to gout. Moderate midfoot osteoarthritis, similar to prior study. Ligaments Collateral ligaments are intact. Muscles and Tendons Flexor, peroneal and extensor compartment tendons are intact. Diffuse atrophy of the intrinsic muscles of the forefoot. Soft tissue Soft tissue ulceration at the plantar base of the fifth metatarsal head. No fluid collection or hematoma. No soft  tissue mass. IMPRESSION: 1. Soft tissue ulceration at the plantar base of the fifth metatarsal head without evidence of osteomyelitis. 2. Small fifth MTP joint effusion is nonspecific, but may be reactive given progressive dorsal subluxation at the fifth MTP joint. Electronically Signed   By: Obie Dredge M.D.   On: 04/08/2018 09:12    Assessment and Plan:   1. Known hx of PAFib     CHA2DS2Vasc is 4, out patient on Eliquis        Rhythm control strategies at this point with a week of un-anticoagulated AFib without TEE is not possible. Amiodarone was added to her BB for rate control 2/2 to RVR  Can not consider resuming her Tikosyn now given she was off her eliquis, she has been a week on IV > PO amiodarone, and QTc is a little long now as well.  In review of last general cards note, she has hx of some degree of bradycardia mentioned, though no hx of syncope I favor rate control until back on in-interrupted a/c and stable neuro-wise/neuro recovery She has plenty of room on her BP for add on diltiazem I will review case with Dr. Ladona Ridgel, he will see later this afternoon  Will stop ASA given Eliquis resumed.  2. AMS, encephalopathy     PRES  Patient is oriented to self, place, gets year wrong, knows who the president is She is accurate to most questions though answers slowly, follows all comands    For questions or updates, please contact [facility] Please consult [email or web address] for contact info under Cardiology/STEMI.   Signed, Sheilah Pigeon, PA-C  04/09/2018 11:43 AM   EP Attending  Patient seen and examined. She is well known to me, admitted with AMS who has improved. She denies chest pain or sob. She is back in atrial fib and is not a candidate for NSR currently without a TEE. I would suggest she continue her rate control with amio and her beta blocker. I am a little nervous about restarting her systemic anti-coagulation. I would suggest leaving her in atrial fib. Do not continue her ASA if she is getting Eliquis.  Follow her for bleeding.   Leonia Reeves.D.

## 2018-04-09 NOTE — Progress Notes (Signed)
[facility]  Patient Status: Active (receiving services up to time of hospitalization)  [facility] is providing the following services: RN  If patient discharges after hours, please call [phone].   Kizzie FurnishDonna Fellmy 04/09/2018, 10:19 AM

## 2018-04-09 NOTE — Progress Notes (Signed)
Patient ID: [name], female   DOB: [birth year], 67 y.o.   MRN: [id number]         [facility]  Date of Admission:  04/02/2018    Total days of antibiotics 8        Day 5 meropenem         ASSESSMENT: She developed an acute soft tissue infection underneath her left first metatarsal head when she recently had a cast placed.  The abscess has drained spontaneously.  MRI scans did not show any evidence of deep infection in either foot.  I do not think this warrants any further antibiotic therapy but will require ongoing wound care.  PLAN: 1. Discontinue meropenem and observe off of antibiotics 2. We will sign off now  Active Problems:   Osteomyelitis of great toe of left foot (HCC)   PRES (posterior reversible encephalopathy syndrome)   Pressure injury of skin   Sepsis (HCC)   Atrial fibrillation with RVR (HCC)   Wound of left leg   Scheduled Meds: . amiodarone  200 mg Oral BID  . apixaban  5 mg Oral BID  . aspirin  81 mg Oral Daily  . carvedilol  25 mg Oral BID WC  . chlorhexidine  15 mL Mouth Rinse BID  . feeding supplement (PRO-STAT SUGAR FREE 64)  30 mL Oral TID  . mouth rinse  15 mL Mouth Rinse q12n4p  . multivitamins with iron  1 tablet Oral Daily  . saccharomyces boulardii  250 mg Oral BID   Continuous Infusions: . sodium chloride 20 mL/hr at 04/05/18 1948   PRN Meds:.sodium chloride, acetaminophen, hydrALAZINE, metoprolol tartrate, ondansetron (ZOFRAN) IV   Review of Systems: Review of Systems  Unable to perform ROS: Mental acuity    Allergies  Allergen Reactions  . Zolpidem Tartrate Other (See Comments)    Delusions, hallucinations  . Amoxicillin Rash and Other (See Comments)    Has patient had a PCN reaction causing immediate rash, facial/tongue/throat swelling, SOB or lightheadedness with hypotension: Yes Has patient had a PCN reaction causing severe rash involving mucus membranes or skin necrosis: No Has patient had a PCN  reaction that required hospitalization: No Has patient had a PCN reaction occurring within the last 10 years: Yes If all of the above answers are "NO", then may proceed with Cephalosporin use.     OBJECTIVE: Vitals:   04/08/18 2355 04/09/18 0343 04/09/18 0900 04/09/18 1132  BP: 133/89 (!) 144/91 (!) 157/90 (!) 156/100  Pulse: 81 88 91 94  Resp: 18 18 18 18   Temp: 98 F (36.7 C) 98.3 F (36.8 C) 97.7 F (36.5 C) 97.6 F (36.4 C)  TempSrc: Oral Oral Oral Oral  SpO2: 99% 99% 99% 98%  Weight:  225 lb 8.5 oz (102.3 kg)    Height:       Body mass index is 37.53 kg/m.  Physical Exam  Constitutional:  She is much more alert but cannot carry on a conversation.  Musculoskeletal:  She has an acute, worsened ulcer under her left first metatarsal head.  There is no exposed bone.  The tissue looks reasonably healthy.  There is no drainage or odor.  Neurological: She is alert.    Lab Results Lab Results  Component Value Date   WBC 7.9 04/08/2018   HGB 10.0 (L) 04/08/2018   HCT 33.7 (L) 04/08/2018   MCV 84.0 04/08/2018   PLT 345 04/08/2018    Lab Results  Component Value Date  CREATININE 0.82 04/08/2018   BUN 19 04/08/2018   NA 135 04/08/2018   K 3.4 (L) 04/08/2018   CL 103 04/08/2018   CO2 25 04/08/2018    Lab Results  Component Value Date   ALT 42 04/02/2018   AST 74 (H) 04/02/2018   ALKPHOS 138 (H) 04/02/2018   BILITOT 0.9 04/02/2018     Microbiology: Recent Results (from the past 240 hour(s))  Blood Culture (routine x 2)     Status: None   Collection Time: 04/02/18  4:17 PM  Result Value Ref Range Status   Specimen Description BLOOD RIGHT ANTECUBITAL  Final   Special Requests   Final    BOTTLES DRAWN AEROBIC ONLY Blood Culture results may not be optimal due to an inadequate volume of blood received in culture bottles   Culture   Final    NO GROWTH 5 DAYS Performed at [facility], [address], [place], [place] [address]    Report Status 04/07/2018  FINAL  Final  Culture, blood (Routine X 2) w Reflex to ID Panel     Status: None   Collection Time: 04/02/18  8:21 PM  Result Value Ref Range Status   Specimen Description BLOOD LEFT HAND  Final   Special Requests   Final    BOTTLES DRAWN AEROBIC AND ANAEROBIC Blood Culture results may not be optimal due to an inadequate volume of blood received in culture bottles   Culture   Final    NO GROWTH 5 DAYS Performed at [facility], [address], [place], [place] [address]    Report Status 04/07/2018 FINAL  Final  Urine culture     Status: Abnormal   Collection Time: 04/02/18  8:57 PM  Result Value Ref Range Status   Specimen Description URINE, CATHETERIZED  Final   Special Requests NONE  Final   Culture (A)  Final    >=100,000 COLONIES/mL ESCHERICHIA COLI Confirmed Extended Spectrum Beta-Lactamase Producer (ESBL).  In bloodstream infections from ESBL organisms, carbapenems are preferred over piperacillin/tazobactam. They are shown to have a lower risk of mortality. Performed at [facility], [address] [place], [place] [address]    Report Status 04/05/2018 FINAL  Final   Organism ID, Bacteria ESCHERICHIA COLI (A)  Final      Susceptibility   Escherichia coli - MIC*    AMPICILLIN >=32 RESISTANT Resistant     CEFAZOLIN >=64 RESISTANT Resistant     CEFTRIAXONE >=64 RESISTANT Resistant     CIPROFLOXACIN >=4 RESISTANT Resistant     GENTAMICIN >=16 RESISTANT Resistant     IMIPENEM <=0.25 SENSITIVE Sensitive     NITROFURANTOIN <=16 SENSITIVE Sensitive     TRIMETH/SULFA <=20 SENSITIVE Sensitive     AMPICILLIN/SULBACTAM >=32 RESISTANT Resistant     PIP/TAZO <=4 SENSITIVE Sensitive     Extended ESBL POSITIVE Resistant     * >=100,000 COLONIES/mL ESCHERICHIA COLI  MRSA PCR Screening     Status: None   Collection Time: 04/02/18 11:02 PM  Result Value Ref Range Status   MRSA by PCR NEGATIVE NEGATIVE Final    Comment:        The GeneXpert MRSA Assay (FDA approved for  NASAL specimens only), is one component of a comprehensive MRSA colonization surveillance program. It is not intended to diagnose MRSA infection nor to guide or monitor treatment for MRSA infections. Performed at [facility], [address], [place], [place] [address]     Jonny Ruiz  Orvan Falconer, MD [facility] [phone] pager   [phone] cell 04/09/2018, 12:35 PM

## 2018-04-09 NOTE — Progress Notes (Signed)
[facility] PROGRESS NOTE  [name] WUJ:[id number] DOB: 02-Aug-1951 DOA: 04/02/2018  PCP: Jarome Matin, MD  Brief History/Interval Summary: 67 year old female with PMH significant for Atrial fib on Eliquis, OSA, and neuropathy. Her recent medical course has been complicated by ongoing foot wounds/ulcers. She was admitted for this back in April of this year at what time she was found to have and abscess on the R foot and underwent surgical I&D. Cultures were negative. She was treated with an extended course of IV antibiotics, Then off and on oral antibiotics. She has been followed by Dr. Ninetta Lights in the ID clinic. Also followed by Dr. Victorino Dike with [facility] who felt nothing to do surgically in May.  Most recently she was started on Doxycyline 6/21 at ortho clinic as she had continued discharge. ID felt she may need further MRI.   Then on  7/8 she was found down by home health aide. She was last known well Friday (7/5) afternoon. In the ED she was alert, but only intermittently answering yes/no questions. Sent for CT scan which was concerning for infarct vs PRES. Her BP was borderline high, but diastolic notably high. She was started on cleviprex infusion. She had MRI, which was more consistent with PRES. She was initially admitted to the intensive care unit.  She was stabilized and then transferred to the floor.  Reason for Visit: PRES.  Consultants: Infectious disease.  Orthopedics.  Neurology.  Electrophysiology   STUDIES:  CT head 7/8 > Bilateral occipital lobe/PCA territory acute infarcts versus PRES. RIGHT occipital lobe petechial hemorrhage. Small potentially acute LEFT MCA territory infarct or PRES. 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 Moderate parenchymal brain volume loss. Moderate chronic small vessel ischemic changes. MRI head 7/8 > T2 hyperintense signal abnormality involving cortex and subcortical white matter in a  posterior distribution. Scattered cortical reduced diffusion. Findings are consistent with PRES and atypical for ischemia. Xray R foot 7/8 > No acute bone abnormality. No radiographic evidence suggestive of Osteomyelitis. Soft tissue abscess. Xray R foot 7/8 > No acute abnormalities. Degenerative changes as described. Chronic soft tissue swelling lateral to the fifth MTP joint.  Transthoracic echocardiogram Study Conclusions  - Left ventricle: The cavity size was normal. Wall thickness was   increased in a pattern of mild LVH. Systolic function was normal.   The estimated ejection fraction was in the range of 60% to 65%.   Wall motion was normal; there were no regional wall motion   abnormalities. The study was not technically sufficient to allow   evaluation of LV diastolic dysfunction due to atrial   fibrillation. - Aortic valve: Mildly calcified annulus. Trileaflet. There was   mild regurgitation. - Mitral valve: Mildly calcified annulus. There was mild   regurgitation. - Left atrium: The atrium was moderately dilated. - Tricuspid valve: There was trivial regurgitation. - Pericardium, extracardiac: A prominent pericardial fat pad was   present.   CULTURES: Blood 7/8 >> Urine 7/8 >>100k gnr>> ESBL E. Coli   ANTIBIOTICS: Aztreonam 7/8 >7/9 Vancomycin 7/8 >7/11 Levofloxacin 7/8 >7/9 Cefepime 7/9>>7/11 Meropenem 7/11>>  SIGNIFICANT EVENTS: 7/8 admit 7/9 ID consulted 7/10 MRI of the brain>> PRES  LINES/TUBES: Foley cath 7/10>> NG tube 7/11>>7/12  Subjective/Interval History: Patient denies any complaints.  States that she is feeling better.  No problems with her appetite.  Diarrhea appears to have resolved.  She is able to pass urine on her own.  ROS: Denies any nausea or  vomiting  Objective:  Vital Signs  Vitals:   04/08/18 2355 04/09/18 0343 04/09/18 0900 04/09/18 1132  BP: 133/89 (!) 144/91 (!) 157/90 (!) 156/100  Pulse: 81 88 91 94  Resp: 18 18 18 18     Temp: 98 F (36.7 C) 98.3 F (36.8 C) 97.7 F (36.5 C) 97.6 F (36.4 C)  TempSrc: Oral Oral Oral Oral  SpO2: 99% 99% 99% 98%  Weight:  102.3 kg (225 lb 8.5 oz)    Height:        Intake/Output Summary (Last 24 hours) at 04/09/2018 1143 Last data filed at 04/09/2018 0345 Gross per 24 hour  Intake 1164.03 ml  Output 1000 ml  Net 164.03 ml   Filed Weights   04/07/18 0400 04/08/18 0530 04/09/18 0343  Weight: 103.5 kg (228 lb 2.8 oz) 102 kg (224 lb 13.9 oz) 102.3 kg (225 lb 8.5 oz)    General appearance: Mental status has improved.  Still mildly distracted. Resp: Clear to auscultation bilaterally.  No wheezing rales or rhonchi Cardio: S1-S2 is irregularly irregular.  No S3-S4.  No rubs murmurs or bruit GI: Abdomen is soft.  Nontender nondistended Extremities: Both feet covered in dressing. Neurologic: Patient is awake alert.  Oriented to place year person.  No obvious focal neurological deficits.  Not much change in her neurological status today.  Lab Results:  Data Reviewed: I have personally reviewed following labs and imaging studies  CBC: Recent Labs  Lab 04/02/18 1634 04/03/18 0220 04/04/18 0734 04/06/18 0237 04/08/18 0634  WBC 13.5* 12.5* 8.8 8.7 7.9  NEUTROABS 11.1*  --  6.5  --   --   HGB 12.9 11.0* 10.8* 10.4* 10.0*  HCT 43.4 36.6 35.8* 35.1* 33.7*  MCV 84.8 83.4 83.4 84.0 84.0  PLT 530* 469* 340 324 345    Basic Metabolic Panel: Recent Labs  Lab 04/03/18 0007 04/03/18 0220 04/04/18 0734 04/05/18 0436 04/06/18 0237 04/06/18 1838 04/08/18 0634  NA 141 142 136 133* 137 135 135  K 4.1 3.8 3.2* 3.1* 3.1* 3.5 3.4*  CL 113* 109 96* 95* 100 102 103  CO2 20* 22 26 25 27 24 25   GLUCOSE 110* 104* 109* 118* 111* 121* 106*  BUN 32* 28* 14 13 24* 20 19  CREATININE 1.08* 1.07* 0.96 0.94 1.04* 1.07* 0.82  CALCIUM 8.2* 8.3* 8.3* 8.3* 8.2* 8.3* 8.3*  MG 1.7 1.7 1.6* 1.8 2.0  --   --   PHOS  --  2.2* 2.3* 2.8 3.1  --   --     GFR: Estimated Creatinine  Clearance: 80 mL/min (by C-G formula based on SCr of 0.82 mg/dL).  Liver Function Tests: Recent Labs  Lab 04/02/18 1634  AST 74*  ALT 42  ALKPHOS 138*  BILITOT 0.9  PROT 8.2*  ALBUMIN 2.9*    Recent Labs  Lab 04/02/18 2206  LIPASE 43   Recent Labs  Lab 04/02/18 1634  AMMONIA 21    Coagulation Profile: Recent Labs  Lab 04/02/18 2206  INR 1.13    Cardiac Enzymes: Recent Labs  Lab 04/02/18 1634 04/03/18 0007 04/03/18 0220 04/03/18 1049  CKTOTAL 219  --   --   --   TROPONINI  --  2.15* 1.84* 1.11*    CBG: Recent Labs  Lab 04/08/18 1939 04/08/18 2328 04/09/18 0314 04/09/18 0916 04/09/18 1130  GLUCAP 105* 91 96 98 104*     Recent Results (from the past 240 hour(s))  Blood Culture (routine x 2)  Status: None   Collection Time: 04/02/18  4:17 PM  Result Value Ref Range Status   Specimen Description BLOOD RIGHT ANTECUBITAL  Final   Special Requests   Final    BOTTLES DRAWN AEROBIC ONLY Blood Culture results may not be optimal due to an inadequate volume of blood received in culture bottles   Culture   Final    NO GROWTH 5 DAYS Performed at [facility], [address], [place], [place] [address]    Report Status 04/07/2018 FINAL  Final  Culture, blood (Routine X 2) w Reflex to ID Panel     Status: None   Collection Time: 04/02/18  8:21 PM  Result Value Ref Range Status   Specimen Description BLOOD LEFT HAND  Final   Special Requests   Final    BOTTLES DRAWN AEROBIC AND ANAEROBIC Blood Culture results may not be optimal due to an inadequate volume of blood received in culture bottles   Culture   Final    NO GROWTH 5 DAYS Performed at [facility], [address], [place], [place] [address]    Report Status 04/07/2018 FINAL  Final  Urine culture     Status: Abnormal   Collection Time: 04/02/18  8:57 PM  Result Value Ref Range Status   Specimen Description URINE, CATHETERIZED  Final   Special Requests NONE  Final   Culture (A)   Final    >=100,000 COLONIES/mL ESCHERICHIA COLI Confirmed Extended Spectrum Beta-Lactamase Producer (ESBL).  In bloodstream infections from ESBL organisms, carbapenems are preferred over piperacillin/tazobactam. They are shown to have a lower risk of mortality. Performed at [facility], [address], [place], [place] [address]    Report Status 04/05/2018 FINAL  Final   Organism ID, Bacteria ESCHERICHIA COLI (A)  Final      Susceptibility   Escherichia coli - MIC*    AMPICILLIN >=32 RESISTANT Resistant     CEFAZOLIN >=64 RESISTANT Resistant     CEFTRIAXONE >=64 RESISTANT Resistant     CIPROFLOXACIN >=4 RESISTANT Resistant     GENTAMICIN >=16 RESISTANT Resistant     IMIPENEM <=0.25 SENSITIVE Sensitive     NITROFURANTOIN <=16 SENSITIVE Sensitive     TRIMETH/SULFA <=20 SENSITIVE Sensitive     AMPICILLIN/SULBACTAM >=32 RESISTANT Resistant     PIP/TAZO <=4 SENSITIVE Sensitive     Extended ESBL POSITIVE Resistant     * >=100,000 COLONIES/mL ESCHERICHIA COLI  MRSA PCR Screening     Status: None   Collection Time: 04/02/18 11:02 PM  Result Value Ref Range Status   MRSA by PCR NEGATIVE NEGATIVE Final    Comment:        The GeneXpert MRSA Assay (FDA approved for NASAL specimens only), is one component of a comprehensive MRSA colonization surveillance program. It is not intended to diagnose MRSA infection nor to guide or monitor treatment for MRSA infections. Performed at [facility], [address], [place], [place] [address]       Radiology Studies: Mr Foot Right W Wo Contrast  Result Date: 04/08/2018 CLINICAL DATA:  Diabetic foot ulcer at the base of the first toe. EXAM: MRI OF THE RIGHT FOREFOOT WITHOUT AND WITH CONTRAST TECHNIQUE: Multiplanar, multisequence MR imaging of the right forefoot was performed before and after the administration of intravenous contrast. CONTRAST:  20 cc MultiHance intravenous contrast. COMPARISON:  Right foot x-rays dated April 02, 2018.  MRI right foot dated January 08, 2018. FINDINGS: Bones/Joint/Cartilage No marrow  signal abnormality. No fracture. No joint effusion. Progressive neuropathic arthropathy of the midfoot. 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 Ligaments Collateral ligaments are intact. Muscles and Tendons Flexor, peroneal and extensor compartment tendons are intact. Diffuse fatty atrophy of the intrinsic muscles of the forefoot. Soft tissue Soft tissue ulceration at the plantar base of the first MTP joint with mild underlying phlegmonous change. Previously seen inflammatory changes in the first intermetatarsal space have resolved. No fluid collection or hematoma. No soft tissue mass. IMPRESSION: 1. Small soft tissue ulceration at the plantar base of the first MTP joint without evidence of underlying osteomyelitis or abscess. 2. Progressive neuropathic arthropathy of the midfoot. Electronically Signed   By: Obie Dredge M.D.   On: 04/08/2018 09:26   Mr Foot Left W Wo Contrast  Result Date: 04/08/2018 CLINICAL DATA:  Diabetic foot ulcer near the fifth MTP joint. EXAM: MRI OF THE LEFT FOREFOOT WITHOUT AND WITH CONTRAST TECHNIQUE: Multiplanar, multisequence MR imaging of the left forefoot was performed both before and after administration of intravenous contrast. CONTRAST:  20mL MULTIHANCE GADOBENATE DIMEGLUMINE 529 MG/ML IV SOLN COMPARISON:  Left foot x-rays dated April 02, 2018. Left foot MRI dated December 08, 2017. FINDINGS: Bones/Joint/Cartilage No marrow signal abnormality. No fracture. Progressive dorsal subluxation of the fifth proximal phalanx with respect to the fifth metatarsal head. Tiny focus of reactive marrow edema in the dorsal base of the fifth proximal phalanx. Small fifth MTP joint effusion. Unchanged hallux valgus deformity with severe first MTP joint osteoarthritis. Unchanged juxtacortical erosion along the medial aspect of the first metatarsal head, possibly secondary to gout. Moderate midfoot  osteoarthritis, similar to prior study. Ligaments Collateral ligaments are intact. Muscles and Tendons Flexor, peroneal and extensor compartment tendons are intact. Diffuse atrophy of the intrinsic muscles of the forefoot. Soft tissue Soft tissue ulceration at the plantar base of the fifth metatarsal head. No fluid collection or hematoma. No soft tissue mass. IMPRESSION: 1. Soft tissue ulceration at the plantar base of the fifth metatarsal head without evidence of osteomyelitis. 2. Small fifth MTP joint effusion is nonspecific, but may be reactive given progressive dorsal subluxation at the fifth MTP joint. Electronically Signed   By: Obie Dredge M.D.   On: 04/08/2018 09:12     Medications:  Scheduled: . amiodarone  200 mg Oral BID  . apixaban  5 mg Oral BID  . aspirin  81 mg Oral Daily  . carvedilol  25 mg Oral BID WC  . chlorhexidine  15 mL Mouth Rinse BID  . feeding supplement (PRO-STAT SUGAR FREE 64)  30 mL Oral TID  . mouth rinse  15 mL Mouth Rinse q12n4p  . multivitamins with iron  1 tablet Oral Daily  . saccharomyces boulardii  250 mg Oral BID   Continuous: . sodium chloride 20 mL/hr at 04/05/18 1948   AVW:UJWJXB chloride, acetaminophen, hydrALAZINE, metoprolol tartrate, ondansetron (ZOFRAN) IV  Assessment/Plan:    Acute metabolic encephalopathy Thought to be secondary to PRES. Seen by neurology.  No seizure activity noted on continuous EEG.  Mental status has significantly improved.  Neurology has signed off.  She is likely close to her baseline.  Petechial hemorrhage in right occipital lobe noted on imaging studies.  Anticoagulation was placed on hold.  Cleared by neurology to resume anticoagulation.    Atrial fibrillation with RVR Heart rate is slightly better controlled compared to previously.  Still with occasional episodes of heart rate in the 120s.  Patient is noted to be on  carvedilol.  Dose was increased on 7/13.  She was started on amiodarone while she was in the  ICU.  Patient was on Tikosyn at home which was held at the time of admission.  Eliquis was placed on hold due to petechial hemorrhage on CT scan.  Okay per neurology to resume.  No plans for surgery per discussions with Dr. Lajoyce Cornersuda today.  Resume Eliquis.  Unclear when to resume Tikosyn.  Discussed with electrophysiology who will evaluate patient and put recommendations in the chart.  EKG to check QT interval.  Bilateral feet ulcers with metatarsal osteomyelitis Orthopedics has seen the patient.  MRI of both the right and left foot were performed.  No clear evidence of osteomyelitis was noted.  Nonspecific changes were seen.  Patient remains on meropenem.  Infectious disease is also following.  Discussed with Dr. Lajoyce Cornersuda today.  No plans for surgery during this hospitalization.  He will follow-up the patient in his office in 2 weeks.  Discussed with Dr. Orvan Falconerampbell who will reexamine the wounds today and make a determination regarding antibiotics going forward.  ESBL E. coli UTI On meropenem.  Foley catheter discontinued.  Diarrhea Likely due to antibiotics.  Appears to have resolved with Imodium.  Abdomen is benign.  Rectal tube discontinued.    Demand ischemia Elevated troponin noted.  Troponin peaked at 2.15.  Thought to be due to demand ischemia.  Patient denies any chest pain currently.  Echocardiogram shows normal systolic function without any wall motion abnormalities.  No further work-up at this time.  Acute renal failure/hypokalemia Resolved.  Potassium was repleted on 7/14.  Obstructive sleep apnea Unclear if she is on CPAP.  Continue to monitor.  Normocytic anemia Anemia panel reviewed.  Ferritin 32.  Folate 6.1.  B12 371.  Hemoglobin is stable.  Continue with multivitamin.    DVT Prophylaxis: Eliquis Code Status: Full Code Family Communication: Discussed with the patient.  No family at bedside. Disposition Plan: Skilled nursing facility recommended by physical therapy.  Await ID  recommendations regarding antibiotics.  No plans for surgery during this hospitalization.  Await EP input as well.  Anticipate discharge to skilled nursing facility in the next 24 to 48 hours.    LOS: 7 days   Osvaldo ShipperGokul Leven Hoel  [facility] Pager [phone] 04/09/2018, 11:43 AM  If 7PM-7AM, please contact night-coverage at [email or web address], password [email or web address]

## 2018-04-09 NOTE — Progress Notes (Signed)
ANTICOAGULATION CONSULT NOTE - Initial Consult  Pharmacy Consult for Eliquis Indication: atrial fibrillation  Allergies  Allergen Reactions  . Zolpidem Tartrate Other (See Comments)    Delusions, hallucinations  . Amoxicillin Rash and Other (See Comments)    Has patient had a PCN reaction causing immediate rash, facial/tongue/throat swelling, SOB or lightheadedness with hypotension: Yes Has patient had a PCN reaction causing severe rash involving mucus membranes or skin necrosis: No Has patient had a PCN reaction that required hospitalization: No Has patient had a PCN reaction occurring within the last 10 years: Yes If all of the above answers are "NO", then may proceed with Cephalosporin use.     Patient Measurements: Height: 5\' 5"  (165.1 cm)(from admission 02/28/18) Weight: 225 lb 8.5 oz (102.3 kg) IBW/kg (Calculated) : 57  Vital Signs: Temp: 97.7 F (36.5 C) (07/15 0900) Temp Source: Oral (07/15 0900) BP: 157/90 (07/15 0900) Pulse Rate: 91 (07/15 0900)  Labs: Recent Labs    04/06/18 1838 04/08/18 0634  HGB  --  10.0*  HCT  --  33.7*  PLT  --  345  CREATININE 1.07* 0.82    Estimated Creatinine Clearance: 80 mL/min (by C-G formula based on SCr of 0.82 mg/dL).   Assessment: [age] YOF with Afib on Eliquis PTA who was admitted with AMS after being found down. Eliquis was held and patient was on heparin, but then all anticoagulation was held due to petechial hemorrhage on CT scan. Neuro has cleared her to resume anticoagulation.  Based on age, SCr, and weight, she does not meet qualifications to lower Eliquis dose.  Goal of Therapy:    Monitor platelets by anticoagulation protocol: Yes   Plan:  Restart Eliquis 5mg  PO BID Follow s/s bleeding  Noted patient on Tikosyn PTA - this was held despite being in active as she was off anticoagulation. Per notes, EP wishes to be involved if/when this is desired to be resumed. Patient currently on PO amiodarone  Olivier Frayre D.  Stefhanie Kachmar, PharmD, BCPS Clinical Pharmacist [phone] Please check AMION for all [facility] numbers 04/09/2018 10:09 AM

## 2018-04-09 NOTE — Consult Note (Addendum)
   [facility]   04/09/2018  Yvette ShutterCarolyn I Orozco [birth year] [id number]    [facility] due to unplanned readmission risk score of 23% (high).  Spoke with [name] at bedside. She reports the discharge plan is for SNF. [facility]. [facility]. [facility].   Currently denies having any concerns with medications. Lives alone. Confirmed Primary Care MD is [facility] listed as doing transition of care calls).  Discussed that writer will make appropriate [facility] referral once disposition is known.  Provided [facility] brochure, contact information, and 24-hr advice line magnet. Appreciative of visit.  Raiford NobleAtika Thamara Leger, MSN-Ed, RN,BSN [facility] Care [facility] Hospital Liaison [phone]

## 2018-04-10 LAB — BASIC METABOLIC PANEL
Anion gap: 7 (ref 5–15)
BUN: 17 mg/dL (ref 8–23)
CO2: 27 mmol/L (ref 22–32)
CREATININE: 0.85 mg/dL (ref 0.44–1.00)
Calcium: 8.6 mg/dL — ABNORMAL LOW (ref 8.9–10.3)
Chloride: 103 mmol/L (ref 98–111)
GFR calc non Af Amer: >60 mL/min (ref >60)
Glucose, Bld: 106 mg/dL — ABNORMAL HIGH (ref 70–99)
POTASSIUM: 4.1 mmol/L (ref 3.5–5.1)
Sodium: 137 mmol/L (ref 135–145)

## 2018-04-10 LAB — GLUCOSE, CAPILLARY
GLUCOSE-CAPILLARY: 110 mg/dL — AB (ref 70–99)
GLUCOSE-CAPILLARY: 111 mg/dL — AB (ref 70–99)
GLUCOSE-CAPILLARY: 92 mg/dL (ref 70–99)
Glucose-Capillary: 91 mg/dL (ref 70–99)
Glucose-Capillary: 101 mg/dL — ABNORMAL HIGH (ref 70–99)
Glucose-Capillary: 100 mg/dL — ABNORMAL HIGH (ref 70–99)
Glucose-Capillary: 125 mg/dL — ABNORMAL HIGH (ref 70–99)

## 2018-04-10 LAB — CBC
HEMATOCRIT: 35.9 % — AB (ref 36.0–46.0)
HEMOGLOBIN: 10.7 g/dL — AB (ref 12.0–15.0)
MCH: 25.2 pg — ABNORMAL LOW (ref 26.0–34.0)
MCHC: 29.8 g/dL — AB (ref 30.0–36.0)
MCV: 84.5 fL (ref 78.0–100.0)
Platelets: 384 10*3/uL (ref 150–400)
RBC: 4.25 MIL/uL (ref 3.87–5.11)
RDW: 17.7 % — AB (ref 11.5–15.5)
WBC: 7.8 10*3/uL (ref 4.0–10.5)

## 2018-04-10 MED ORDER — PRO-STAT SUGAR FREE PO LIQD: 30.0000 mL | Freq: Three times a day (TID) | ORAL | 0 refills | Status: AC

## 2018-04-10 MED ORDER — DILTIAZEM HCL 30 MG PO TABS
30.0000 mg | ORAL_TABLET | Freq: Two times a day (BID) | ORAL | Status: DC
  Administered 2018-04-10 – 2018-04-14 (×6): 30 mg via ORAL
  Filled 2018-04-10 (×8): qty 1

## 2018-04-10 MED ORDER — VORTIOXETINE HBR 5 MG PO TABS
5.0000 mg | ORAL_TABLET | Freq: Every day | ORAL | Status: DC
  Administered 2018-04-10 – 2018-04-14 (×5): 5 mg via ORAL
  Filled 2018-04-10 (×5): qty 1

## 2018-04-10 MED ORDER — BUPROPION HCL ER (XL) 150 MG PO TB24
150.0000 mg | ORAL_TABLET | Freq: Two times a day (BID) | ORAL | Status: DC
  Administered 2018-04-10 – 2018-04-14 (×9): 150 mg via ORAL
  Filled 2018-04-10 (×9): qty 1

## 2018-04-10 MED ORDER — SACCHAROMYCES BOULARDII 250 MG PO CAPS: 250.0000 mg | ORAL_CAPSULE | Freq: Two times a day (BID) | ORAL | Status: AC

## 2018-04-10 MED ORDER — BENZTROPINE MESYLATE 0.5 MG PO TABS
1.5000 mg | ORAL_TABLET | Freq: Every day | ORAL | Status: DC
  Administered 2018-04-10 – 2018-04-14 (×5): 1.5 mg via ORAL
  Filled 2018-04-10 (×6): qty 3

## 2018-04-10 NOTE — Social Work (Signed)
CSW has reached out to pt preference [facility], they have bed available. Await insurance authorization from [employer] before contacting attending MD for summary.  [name], LCSWA [facility] Clinical Social Work [phone]

## 2018-04-10 NOTE — Progress Notes (Signed)
[facility] PROGRESS NOTE  [name] YQM:[id number] DOB: 15-Oct-1950 DOA: 04/02/2018  PCP: Jarome Matin, MD  Brief History/Interval Summary: 67 year old female with PMH significant for Atrial fib on Eliquis, OSA, and neuropathy. Her recent medical course has been complicated by ongoing foot wounds/ulcers. She was admitted for this back in April of this year at what time she was found to have and abscess on the R foot and underwent surgical I&D. Cultures were negative. She was treated with an extended course of IV antibiotics, Then off and on oral antibiotics. She has been followed by Dr. Ninetta Lights in the ID clinic. Also followed by Dr. Victorino Dike with [facility] who felt nothing to do surgically in May.  Most recently she was started on Doxycyline 6/21 at ortho clinic as she had continued discharge. ID felt she may need further MRI.   Then on  7/8 she was found down by home health aide. She was last known well Friday (7/5) afternoon. In the ED she was alert, but only intermittently answering yes/no questions. Sent for CT scan which was concerning for infarct vs PRES. Her BP was borderline high, but diastolic notably high. She was started on cleviprex infusion. She had MRI, which was more consistent with PRES. She was initially admitted to the intensive care unit.  She was stabilized and then transferred to the floor.  Reason for Visit: PRES.  Consultants: Infectious disease.  Orthopedics.  Neurology.  Electrophysiology   STUDIES:  CT head 7/8 > Bilateral occipital lobe/PCA territory acute infarcts versus PRES. RIGHT occipital lobe petechial hemorrhage. Small potentially acute LEFT MCA territory infarct or PRES. 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 Moderate parenchymal brain volume loss. Moderate chronic small vessel ischemic changes. MRI head 7/8 > T2 hyperintense signal abnormality involving cortex and subcortical white matter in a  posterior distribution. Scattered cortical reduced diffusion. Findings are consistent with PRES and atypical for ischemia. Xray R foot 7/8 > No acute bone abnormality. No radiographic evidence suggestive of Osteomyelitis. Soft tissue abscess. Xray R foot 7/8 > No acute abnormalities. Degenerative changes as described. Chronic soft tissue swelling lateral to the fifth MTP joint.  Transthoracic echocardiogram Study Conclusions  - Left ventricle: The cavity size was normal. Wall thickness was   increased in a pattern of mild LVH. Systolic function was normal.   The estimated ejection fraction was in the range of 60% to 65%.   Wall motion was normal; there were no regional wall motion   abnormalities. The study was not technically sufficient to allow   evaluation of LV diastolic dysfunction due to atrial   fibrillation. - Aortic valve: Mildly calcified annulus. Trileaflet. There was   mild regurgitation. - Mitral valve: Mildly calcified annulus. There was mild   regurgitation. - Left atrium: The atrium was moderately dilated. - Tricuspid valve: There was trivial regurgitation. - Pericardium, extracardiac: A prominent pericardial fat pad was   present.   CULTURES: Blood 7/8 >> Urine 7/8 >>100k gnr>> ESBL E. Coli   ANTIBIOTICS: Aztreonam 7/8 >7/9 Vancomycin 7/8 >7/11 Levofloxacin 7/8 >7/9 Cefepime 7/9>>7/11 Meropenem 7/11>>7/15  SIGNIFICANT EVENTS: 7/8 admit 7/9 ID consulted 7/10 MRI of the brain>> PRES  LINES/TUBES: Foley cath 7/10>> NG tube 7/11>>7/12  Subjective/Interval History: Patient denies any complaints.  Still noted to be mildly distracted.  Reports poor appetite.  Denies any headaches.  ROS: Denies any nausea or vomiting  Objective:  Vital Signs  Vitals:   04/09/18 1722 04/09/18 2017  04/10/18 0002 04/10/18 0900  BP: (!) 148/115 123/86 (!) 132/117 117/66  Pulse: 91 (!) 101 61 67  Resp: 18 18 18 18   Temp: 98.3 F (36.8 C) 98 F (36.7 C) 97.9 F  (36.6 C) 97.9 F (36.6 C)  TempSrc: Oral Oral Oral Oral  SpO2: 100% 98% 99% 98%  Weight:      Height:        Intake/Output Summary (Last 24 hours) at 04/10/2018 0951 Last data filed at 04/10/2018 0519 Gross per 24 hour  Intake -  Output 1300 ml  Net -1300 ml   Filed Weights   04/07/18 0400 04/08/18 0530 04/09/18 0343  Weight: 103.5 kg (228 lb 2.8 oz) 102 kg (224 lb 13.9 oz) 102.3 kg (225 lb 8.5 oz)    General appearance: Still mildly distracted.  Awake alert. Resp: Clear to auscultation bilaterally Cardio: S1-S2 irregularly irregular.  No S3-S4.  No rubs murmurs or bruit GI: Abdomen is soft.  Nontender nondistended Extremities: Both feet covered in dressing. Neurologic: Awake alert.  Oriented to place year person.  No obvious focal neurological deficits.  Lab Results:  Data Reviewed: I have personally reviewed following labs and imaging studies  CBC: Recent Labs  Lab 04/04/18 0734 04/06/18 0237 04/08/18 0634 04/10/18 0519  WBC 8.8 8.7 7.9 7.8  NEUTROABS 6.5  --   --   --   HGB 10.8* 10.4* 10.0* 10.7*  HCT 35.8* 35.1* 33.7* 35.9*  MCV 83.4 84.0 84.0 84.5  PLT 340 324 345 384    Basic Metabolic Panel: Recent Labs  Lab 04/04/18 0734 04/05/18 0436 04/06/18 0237 04/06/18 1838 04/08/18 0634 04/10/18 0519  NA 136 133* 137 135 135 137  K 3.2* 3.1* 3.1* 3.5 3.4* 4.1  CL 96* 95* 100 102 103 103  CO2 26 25 27 24 25 27   GLUCOSE 109* 118* 111* 121* 106* 106*  BUN 14 13 24* 20 19 17   CREATININE 0.96 0.94 1.04* 1.07* 0.82 0.85  CALCIUM 8.3* 8.3* 8.2* 8.3* 8.3* 8.6*  MG 1.6* 1.8 2.0  --   --   --   PHOS 2.3* 2.8 3.1  --   --   --     GFR: Estimated Creatinine Clearance: 77.2 mL/min (by C-G formula based on SCr of 0.85 mg/dL).   Cardiac Enzymes: Recent Labs  Lab 04/03/18 1049  TROPONINI 1.11*    CBG: Recent Labs  Lab 04/09/18 1719 04/09/18 2016 04/10/18 0015 04/10/18 0332 04/10/18 0755  GLUCAP 95 102* 110* 91 101*     Recent Results (from the  past 240 hour(s))  Blood Culture (routine x 2)     Status: None   Collection Time: 04/02/18  4:17 PM  Result Value Ref Range Status   Specimen Description BLOOD RIGHT ANTECUBITAL  Final   Special Requests   Final    BOTTLES DRAWN AEROBIC ONLY Blood Culture results may not be optimal due to an inadequate volume of blood received in culture bottles   Culture   Final    NO GROWTH 5 DAYS Performed at [facility], [address], [place], [place] [address]    Report Status 04/07/2018 FINAL  Final  Culture, blood (Routine X 2) w Reflex to ID Panel     Status: None   Collection Time: 04/02/18  8:21 PM  Result Value Ref Range Status   Specimen Description BLOOD LEFT HAND  Final   Special Requests   Final    BOTTLES DRAWN AEROBIC AND ANAEROBIC Blood  Culture results may not be optimal due to an inadequate volume of blood received in culture bottles   Culture   Final    NO GROWTH 5 DAYS Performed at [facility], [address], [place], [place] [address]    Report Status 04/07/2018 FINAL  Final  Urine culture     Status: Abnormal   Collection Time: 04/02/18  8:57 PM  Result Value Ref Range Status   Specimen Description URINE, CATHETERIZED  Final   Special Requests NONE  Final   Culture (A)  Final    >=100,000 COLONIES/mL ESCHERICHIA COLI Confirmed Extended Spectrum Beta-Lactamase Producer (ESBL).  In bloodstream infections from ESBL organisms, carbapenems are preferred over piperacillin/tazobactam. They are shown to have a lower risk of mortality. Performed at [facility], [address] [place], [place] [address]    Report Status 04/05/2018 FINAL  Final   Organism ID, Bacteria ESCHERICHIA COLI (A)  Final      Susceptibility   Escherichia coli - MIC*    AMPICILLIN >=32 RESISTANT Resistant     CEFAZOLIN >=64 RESISTANT Resistant     CEFTRIAXONE >=64 RESISTANT Resistant     CIPROFLOXACIN >=4 RESISTANT Resistant     GENTAMICIN >=16 RESISTANT Resistant     IMIPENEM  <=0.25 SENSITIVE Sensitive     NITROFURANTOIN <=16 SENSITIVE Sensitive     TRIMETH/SULFA <=20 SENSITIVE Sensitive     AMPICILLIN/SULBACTAM >=32 RESISTANT Resistant     PIP/TAZO <=4 SENSITIVE Sensitive     Extended ESBL POSITIVE Resistant     * >=100,000 COLONIES/mL ESCHERICHIA COLI  MRSA PCR Screening     Status: None   Collection Time: 04/02/18 11:02 PM  Result Value Ref Range Status   MRSA by PCR NEGATIVE NEGATIVE Final    Comment:        The GeneXpert MRSA Assay (FDA approved for NASAL specimens only), is one component of a comprehensive MRSA colonization surveillance program. It is not intended to diagnose MRSA infection nor to guide or monitor treatment for MRSA infections. Performed at [facility], [address], [place], [place] [address]       Radiology Studies: No results found.   Medications:  Scheduled: . amiodarone  200 mg Oral BID  . apixaban  5 mg Oral BID  . benztropine  1.5 mg Oral Daily  . buPROPion  150 mg Oral BID  . carvedilol  25 mg Oral BID WC  . chlorhexidine  15 mL Mouth Rinse BID  . diltiazem  30 mg Oral Q12H  . feeding supplement (PRO-STAT SUGAR FREE 64)  30 mL Oral TID  . mouth rinse  15 mL Mouth Rinse q12n4p  . multivitamins with iron  1 tablet Oral Daily  . saccharomyces boulardii  250 mg Oral BID  . vortioxetine HBr  5 mg Oral Daily   Continuous: . sodium chloride 20 mL/hr at 04/05/18 1948   BJY:NWGNFA chloride, acetaminophen, hydrALAZINE, metoprolol tartrate, ondansetron (ZOFRAN) IV  Assessment/Plan:    Acute metabolic encephalopathy Thought to be secondary to PRES. Seen by neurology.  No seizure activity noted on continuous EEG.  Mental status has significantly improved.  Neurology has signed off.  She is likely close to her baseline.  Petechial hemorrhage in right occipital lobe noted on imaging studies.  Anticoagulation was placed on hold.  Cleared by neurology to resume anticoagulation.  Patient seems to be stable.  She  has made significant progress but remains distracted.  Atrial fibrillation with RVR Patient  with known history of atrial fibrillation.  Followed by Dr. Ladona Ridgelaylor.  She was on carvedilol and Tikosyn previously.  These medications were initially held.  Anticoagulation had to be stopped due to presence of petechial hemorrhage on neuroimaging studies.  Patient was continued on carvedilol.  Dose had to be increased for poorly controlled heart rate.  Seen by electrophysiology.  They recommend keeping her off of the Tikosyn for now.  Anticoagulation resumed after cleared by neurology.  She was also started on amiodarone while she was in the ICU which is being continued.  Heart rate at times noted to be poorly controlled.  Low-dose Cardizem initiated as recommended by cardiology.  We will continue to watch closely.  Outpatient follow-up with electrophysiology.  EKG did show mildly prolonged QT interval.    Bilateral feet ulcers with metatarsal osteomyelitis Orthopedics has seen the patient.  MRI of both the right and left foot were performed.  No clear evidence of osteomyelitis was noted.  Nonspecific changes were seen.  Patient remains on meropenem.  Infectious disease is also following.  Discussed with Dr. Lajoyce Cornersuda on 7/15.  No plans for surgery during this hospitalization.  He will follow-up the patient in his office in 2 weeks.  Patient also reevaluated by infectious disease yesterday and taken off of antibiotics due to improvement in the wounds.    ESBL E. coli UTI Patient completed a course of meropenem.  Foley catheter was discontinued.  Diarrhea Appears to have resolved.  Most likely due to antibiotics.  She had a rectal tube which was discontinued as well.     Demand ischemia Elevated troponin noted.  Troponin peaked at 2.15.  Thought to be due to demand ischemia.  Patient denies any chest pain currently.  Echocardiogram shows normal systolic function without any wall motion abnormalities.  No further  work-up at this time.  Acute renal failure/hypokalemia Resolved.  Potassium is normal this morning.  Obstructive sleep apnea Unclear if she is on CPAP.  Continue to monitor.  Normocytic anemia Anemia panel reviewed.  Ferritin 32.  Folate 6.1.  B12 371.  Hemoglobin is stable.  Continue with multivitamin.    Patient with history of depression and anxiety Noted to be on multiple psychotropic medications at home including benztropine, Wellbutrin, gabapentin, Trintellix and as needed Remeron for insomnia.  Since she is recovering from being severely encephalopathic we will resume these medications gradually.  Hold the gabapentin and the Remeron for now.   DVT Prophylaxis: Eliquis Code Status: Full Code Family Communication: Discussed with the patient.  No family at bedside. Disposition Plan: Skilled nursing facility recommended by physical therapy.  Patient appears to be medically stable.  Okay for discharge to skilled nursing facility when bed is available.    LOS: 8 days   Osvaldo ShipperGokul Hezekiah Veltre  [facility] Pager [phone] 04/10/2018, 9:51 AM  If 7PM-7AM, please contact night-coverage at [email or web address], password [email or web address]

## 2018-04-10 NOTE — Care Management Important Message (Signed)
Important Message  Patient Details  Name: [name] MRN: [id number] Date of Birth: 1951/07/20   Medicare Important Message Given:  Yes    Lawerance Sabalebbie Arika Mainer, RN 04/10/2018, 10:14 AM

## 2018-04-10 NOTE — Progress Notes (Signed)
Nutrition Follow-up  DOCUMENTATION CODES:   Obesity unspecified  INTERVENTION:  Continue MVI with iron  Continue Pro-stat 30mL TID  NUTRITION DIAGNOSIS:   Increased nutrient needs related to wound healing as evidenced by estimated needs. -ongoing  GOAL:   Patient will meet greater than or equal to 90% of their needs -progressing  MONITOR:   PO intake, Supplement acceptance  ASSESSMENT:   67 yo female with PMH of HTN, HLD, GERD, arthritis, lumbago, obesity, PVD, chronic foot ulcers, and OSA who was admitted on 7/8 with PRES after being found down covered in urine and feces.  Spoke with patient at bedside. She still seems to be a little confused, said she had "maple" for breakfast. Asked for clarification twice and patient just reiterated "maple." Reports good ok appetite. Documented meal completion today has been 50% at each meal. Clinically she is believed to be close to her baseline.  Dispo: Possible discharge today  Medications reviewed and include Labs reviewed:  CBGs 93, 95, 98, 98, 125  NUTRITION - FOCUSED PHYSICAL EXAM:    Most Recent Value  Orbital Region  No depletion  Upper Arm Region  No depletion  Thoracic and Lumbar Region  Unable to assess  Buccal Region  No depletion  Temple Region  No depletion  Clavicle Bone Region  No depletion  Clavicle and Acromion Bone Region  No depletion  Scapular Bone Region  No depletion  Dorsal Hand  No depletion  Patellar Region  No depletion  Anterior Thigh Region  No depletion  Posterior Calf Region  Unable to assess  Edema (RD Assessment)  Unable to assess  Hair  Reviewed  Eyes  Reviewed  Mouth  Unable to assess  Skin  Reviewed  Nails  Reviewed       Diet Order:   Diet Order           DIET DYS 3 Room service appropriate? Yes; Fluid consistency: Thin  Diet effective now          EDUCATION NEEDS:   No education needs have been identified at this time  Skin:  Skin Assessment: Skin Integrity  Issues: Skin Integrity Issues:: DTI, Stage II, Other (Comment) DTI: R foot Stage II: Toe (R foot), buttocks Other: Non-pressure wound L foot  Last BM:  04/09/2018  Height:   Ht Readings from Last 1 Encounters:  04/05/18 5\' 5"  (1.651 m)    Weight:   Wt Readings from Last 1 Encounters:  04/11/18 221 lb 12.5 oz (100.6 kg)    Ideal Body Weight:  56.8 kg  BMI:  Body mass index is 36.91 kg/m.  Estimated Nutritional Needs:   Kcal:  1700-1900  Protein:  100-115 gm  Fluid:  1.8 L    Dionne AnoWilliam M. Kam Rahimi, MS, RD LDN Inpatient Clinical Dietitian Pager [phone]

## 2018-04-10 NOTE — Progress Notes (Signed)
Physical Therapy Treatment Patient Details Name: Yvette ShutterCarolyn I Orozco MRN: [id number] DOB: [birth year] Today's Date: 04/10/2018    History of Present Illness 67 y.o. female found on ground by home health aide, admitted MRI consistent with PRES. PMH includes: A fib, OSA, neuropathy, ongoing foot wound/ulcers., Obesity, HTN, urinary incontinence, B TKA.     PT Comments    [name] received in bed and willing to participate with PT today. PT session focusing on progressing functional mobility as able. Patient requiring Mod A +2 for bed mobility with Max A +2 for sit to stand x 2 at bedside. Unable to progress mobility further due to patient safety concerns due to poor safety awareness and limited ability to assist from patient. PT recommendations remain appropriate. PT to continue to follow acutely.    Follow Up Recommendations  SNF;Supervision/Assistance - 24 hour     Equipment Recommendations  (TBD)    Recommendations for Other Services OT consult     Precautions / Restrictions Precautions Precautions: Fall    Mobility  Bed Mobility Overal bed mobility: Needs Assistance Bed Mobility: Rolling;Supine to Sit;Sit to Supine Rolling: Mod assist   Supine to sit: Mod assist;+2 for physical assistance Sit to supine: Mod assist;+2 for physical assistance   General bed mobility comments: Mod A +2 for bed mobility for LE management and trunk control  Transfers Overall transfer level: Needs assistance Equipment used: Rolling walker (2 wheeled) Transfers: Sit to/from Stand Sit to Stand: Max assist;+2 physical assistance         General transfer comment: Max A +2 for sit to stand x 2 at bedside; patient prematurely sitting stating "I need to get grounded"  Ambulation/Gait             General Gait Details: deferred   Stairs             Wheelchair Mobility    Modified Rankin (Stroke Patients Only)       Balance Overall balance assessment: Needs  assistance Sitting-balance support: No upper extremity supported;Feet supported Sitting balance-Leahy Scale: Fair     Standing balance support: Bilateral upper extremity supported Standing balance-Leahy Scale: Zero                              Cognition Arousal/Alertness: Awake/alert Behavior During Therapy: Flat affect Overall Cognitive Status: Impaired/Different from baseline Area of Impairment: Orientation;Attention;Following commands;Problem solving                 Orientation Level: Disoriented to;Situation;Time Current Attention Level: Focused   Following Commands: Follows one step commands inconsistently;Follows one step commands with increased time     Problem Solving: Slow processing;Decreased initiation;Difficulty sequencing;Requires verbal cues;Requires tactile cues General Comments: requires consistent cueing during conversation      Exercises General Exercises - Lower Extremity Ankle Circles/Pumps: Right;Left;10 reps Long Arc Quad: Right;Left;10 reps    General Comments        Pertinent Vitals/Pain Pain Assessment: No/denies pain    Home Living                      Prior Function            PT Goals (current goals can now be found in the care plan section) Acute Rehab PT Goals Patient Stated Goal: Pt unable to participate  PT Goal Formulation: With patient Time For Goal Achievement: 04/14/18 Potential to Achieve Goals: Fair Progress towards PT goals: Progressing toward  goals    Frequency    Min 3X/week      PT Plan Current plan remains appropriate    Co-evaluation              AM-PAC PT "6 Clicks" Daily Activity  Outcome Measure  Difficulty turning over in bed (including adjusting bedclothes, sheets and blankets)?: Unable Difficulty moving from lying on back to sitting on the side of the bed? : Unable Difficulty sitting down on and standing up from a chair with arms (e.g., wheelchair, bedside commode,  etc,.)?: Unable Help needed moving to and from a bed to chair (including a wheelchair)?: Total Help needed walking in hospital room?: Total Help needed climbing 3-5 steps with a railing? : Total 6 Click Score: 6    End of Session Equipment Utilized During Treatment: Gait belt Activity Tolerance: Patient tolerated treatment well Patient left: in bed;with call bell/phone within reach;with bed alarm set Nurse Communication: Mobility status PT Visit Diagnosis: Unsteadiness on feet (R26.81);Muscle weakness (generalized) (M62.81)     Time: 8657-8469 PT Time Calculation (min) (ACUTE ONLY): 29 min  Charges:  $Therapeutic Exercise: 8-22 mins $Therapeutic Activity: 8-22 mins                    G Codes:       Kipp Laurence, PT, DPT 04/10/18 1:16 PM Pager: [phone]

## 2018-04-10 NOTE — Clinical Social Work Note (Signed)
Clinical Social Work Assessment  Patient Details  Name: [name] MRN: [id number] Date of Birth: 1951/06/09  Date of referral:  04/10/18               Reason for consult:  Discharge Planning, Facility Placement                Permission sought to share information with:  Facility Sport and exercise psychologist, Family Supports Permission granted to share information::  Yes, Verbal Permission Granted  Name::      Kourtlyn Charlet and [place]::   [place]  Relationship::   sister and brother  Contact Information:   [phone] & [phone]  Housing/Transportation Living arrangements for the past 2 months:  [place] of Information:  Patient, Siblings Patient Interpreter Needed:  None Criminal Activity/Legal Involvement Pertinent to Current Situation/Hospitalization:  No - Comment as needed Significant Relationships:  Friend, Siblings Lives with:  Self Do you feel safe going back to the place where you live?  Yes Need for family participation in patient care:  Yes (Comment)  Care giving concerns:  Pt has been in and out of the hospital and requires both support with decision making and assistance with IADLs and ADLs. Pt has wounds that need skilled nursing care. SNF recommended.    Social Worker assessment / plan:  CSW met with pt at bedside, pt able to state that she has been to rehab previously and recently has had home health with [facility]. Pt lives home alone, has a sister and a brother in [place] and [place] respectively. Pt states that she also has a friend nearby in town. Otherwise does not have 24/7 assistance in her home. When given list to make choice pt expressed hesitation and confusion. CSW asked if pt would like assistance with making SNF choice. She states yes.   CSW spoke with pt's sister Joaquim Lai with permission about pt's stay here at the hospital and discharge plans. Joaquim Lai aware pt is home alone and would prefer for pt to  have support at [facility]. Pt sister aware that we need support with making a decision. List emailed to [email or web address] for pt sister and pt brother to make decision.   CSW then spoke with pt brother Richardson Landry. Pt brother and sister in law Kendrick Fries, had recently visited pt in [place] and were trying to assist pt with getting financials straight and with some paperwork that had built up. They are looking into Medicaid for pt in case she continues to require more assistance at home or in LTC. They are aware that this SNF placement is not LTC and are committed to helping the pt with whatever they can to set things up financially and provide the most supportive environment that they can. Pt brother and pt sister both prefer [facility] from the list and are aware that there is an associated daily copay for pt at SNF through [facility] [employer].   CSW to f/u with [facility] and [facility].   Employment status:  Disabled (Comment on whether or not currently receiving Disability) Insurance information:  Programmer, applications PT Recommendations:  [place], 24 Hour Supervision Information / Referral to community resources:  [place]  Patient/Family's Response to care:  Pt amenable to CSW visit and SNF recommendations, did request support from family to make SNF choice.   Patient/Family's Understanding of and Emotional Response to Diagnosis, Current Treatment, and Prognosis: Pt family seem to be confused about where pt is mentally but  state understanding overall with pt diagnosis, current treatment, prognosis and recommendations for care going forward. They seem realistic with pt needs, happy with care plans, and available to support pt with figuring out future plans and support.   Emotional Assessment Appearance:  Appears older than stated age Attitude/Demeanor/Rapport:  Lethargic, Gracious Affect (typically observed):  Accepting, Flat, Quiet Orientation:  Oriented to Self, Oriented to  Place, Oriented to  Time, Oriented to Situation Alcohol / Substance use:    Psych involvement (Current and /or in the community):  No (Comment)  Discharge Needs  Concerns to be addressed:  Care Coordination, Discharge Planning Concerns Readmission within the last 30 days:  No Current discharge risk:  Dependent with Mobility, Physical Impairment, Inadequate Financial Supports Barriers to Discharge:  Ship broker, Continued Medical Work up   [employer], [place] 04/10/2018, 1:03 PM

## 2018-04-10 NOTE — NC FL2 (Addendum)
[place] MEDICAID FL2 LEVEL OF CARE SCREENING TOOL     IDENTIFICATION  Patient Name: [name] Birthdate: 1951-04-07 Sex: female Admission Date (Current Location): 04/02/2018  [facility] and [place] Number:  Producer, television/film/video and Address:  [facility] [address] [place], [place] [address]      Provider Number: [id number]  Attending Physician Name and Address:  Osvaldo Shipper, MD  Relative Name and Phone Number:   Aviyah Swetz [phone]    Current Level of Care: Hospital Recommended Level of Care: Skilled Nursing Facility Prior Approval Number:    Date Approved/Denied:   PASRR Number: [id number]  Discharge Plan: SNF    Current Diagnoses: Patient Active Problem List   Diagnosis Date Noted  . Wound of left leg   . Sepsis (HCC)   . Atrial fibrillation with RVR (HCC)   . Pressure injury of skin 04/03/2018  . PRES (posterior reversible encephalopathy syndrome) 04/02/2018  . Depression   . Hypokalemia   . Anemia   . Neuropathy   . Osteomyelitis of great toe of left foot (HCC) 01/07/2018  . 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 Paroxysmal atrial fibrillation (HCC) 03/14/2017  . Morbid obesity (HCC) 06/12/2015  . 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 Periodic limb movement disorder (PLMD) 12/30/2013  . OSA (obstructive sleep apnea) 12/30/2013  . Abnormality of gait 10/02/2013  . Lumbago 10/02/2013  . Essential hypertension 02/17/2012  . S/P total knee arthroplasty, left 02/17/2012    Orientation RESPIRATION BLADDER Height & Weight     Self, Time, Situation, Place  Normal Incontinent Weight: 225 lb 8.5 oz (102.3 kg) Height:  5\' 5"  (165.1 cm)(from admission 02/28/18)  BEHAVIORAL SYMPTOMS/MOOD NEUROLOGICAL BOWEL NUTRITION STATUS      Incontinent(rectal pouch) Diet(see discharge summary)  AMBULATORY STATUS COMMUNICATION OF NEEDS Skin   Extensive Assist Verbally PU Stage and Appropriate  Care   PU Stage 2 Dressing: (on buttocks; on right toes)                   Personal Care Assistance Level of Assistance  Bathing, Feeding, Dressing Bathing Assistance: Maximum assistance Feeding assistance: Independent Dressing Assistance: Maximum assistance     Functional Limitations Info  Sight, Hearing, Speech Sight Info: Adequate(wears glasses) Hearing Info: Adequate Speech Info: Impaired(expressive aphasia)    SPECIAL CARE FACTORS FREQUENCY  OT (By licensed OT), PT (By licensed PT)     PT Frequency: 5x week OT Frequency: 5x week            Contractures Contractures Info: Not present    Additional Factors Info  Code Status, Allergies, Psychotropic Code Status Info: Full Code Allergies Info: ZOLPIDEM TARTRATE, AMOXICILLIN  Psychotropic Info: buPROPion (WELLBUTRIN XL) 24 hr tablet 150 mg PO 2x daily; vortioxetine HBr (TRINTELLIX) tablet 5 mg PO daily         Current Medications (04/10/2018):  This is the current hospital active medication list Current Facility-Administered Medications  Medication Dose Route Frequency Provider Last Rate Last Dose  . 0.9 %  sodium chloride infusion  250 mL Intravenous PRN Shirley, [place], DO 20 mL/hr at 04/05/18 1948    . acetaminophen (TYLENOL) tablet 650 mg  650 mg Oral Q6H PRN Osvaldo Shipper, MD      . amiodarone (PACERONE) tablet 200 mg  200 mg Oral BID Shirley, [place], DO   200 mg at 04/10/18 0849  . apixaban (ELIQUIS) tablet 5 mg  5 mg Oral BID Bajbus, Lauren  D, RPH   5 mg at 04/10/18 0849  . benztropine (COGENTIN) tablet 1.5 mg  1.5 mg Oral Daily Osvaldo ShipperKrishnan, Gokul, MD      . buPROPion (WELLBUTRIN XL) 24 hr tablet 150 mg  150 mg Oral BID Osvaldo ShipperKrishnan, Gokul, MD      . carvedilol (COREG) tablet 25 mg  25 mg Oral BID WC Osvaldo ShipperKrishnan, Gokul, MD   25 mg at 04/10/18 0849  . chlorhexidine (PERIDEX) 0.12 % solution 15 mL  15 mL Mouth Rinse BID Shirley, [place], DO   15 mL at 04/10/18 0855  . diltiazem (CARDIZEM) tablet 30 mg  30 mg Oral Q12H  Osvaldo ShipperKrishnan, Gokul, MD      . feeding supplement (PRO-STAT SUGAR FREE 64) liquid 30 mL  30 mL Oral TID Osvaldo ShipperKrishnan, Gokul, MD   30 mL at 04/10/18 0848  . hydrALAZINE (APRESOLINE) injection 10 mg  10 mg Intravenous Q4H PRN Deterding, Dorise HissElizabeth C, MD      . MEDLINE mouth rinse  15 mL Mouth Rinse q12n4p Talbert ForestShirley, [place]az[place], DO   15 mL at 04/09/18 1814  . metoprolol tartrate (LOPRESSOR) injection 5 mg  5 mg Intravenous Q6H PRN Osvaldo ShipperKrishnan, Gokul, MD      . multivitamins with iron tablet 1 tablet  1 tablet Oral Daily Osvaldo ShipperKrishnan, Gokul, MD   1 tablet at 04/10/18 0849  . ondansetron (ZOFRAN) injection 4 mg  4 mg Intravenous Q6H PRN Shirley, [place], DO   4 mg at 04/07/18 0323  . saccharomyces boulardii (FLORASTOR) capsule 250 mg  250 mg Oral BID Osvaldo ShipperKrishnan, Gokul, MD   250 mg at 04/10/18 0849  . vortioxetine HBr (TRINTELLIX) tablet 5 mg  5 mg Oral Daily Osvaldo ShipperKrishnan, Gokul, MD         Discharge Medications: Please see discharge summary for a list of discharge medications.  Relevant Imaging Results:  Relevant Lab Results:   Additional Information SS# [id number]  Effie Wahlert H [place], [place]CSWA

## 2018-04-11 ENCOUNTER — Other Ambulatory Visit: Payer: Self-pay

## 2018-04-11 ENCOUNTER — Encounter: Payer: Self-pay | Admitting: *Deleted

## 2018-04-11 DIAGNOSIS — S81802S Unspecified open wound, left lower leg, sequela: Secondary | ICD-10-CM

## 2018-04-11 LAB — GLUCOSE, CAPILLARY
GLUCOSE-CAPILLARY: 87 mg/dL (ref 70–99)
Glucose-Capillary: 98 mg/dL (ref 70–99)
Glucose-Capillary: 98 mg/dL (ref 70–99)
Glucose-Capillary: 95 mg/dL (ref 70–99)
Glucose-Capillary: 93 mg/dL (ref 70–99)
Glucose-Capillary: 99 mg/dL (ref 70–99)
Glucose-Capillary: 101 mg/dL — ABNORMAL HIGH (ref 70–99)

## 2018-04-11 NOTE — Care Management Note (Addendum)
Case Management Note  Patient Details  Name: [name] MRN: [id number] Date of Birth: [birth year]  Subjective/Objective:                Patient denied for SNF, P2P upheld denial. All of patient's family is out of state. Discussed with sister [name] with [facility] CSW plan for discharge. Patient is active w [facility], we can load up home health services however these are not permanent and are brief visits in the home. Suggested private duty help in addition to [facility].  Patient at this time is not able to ambulate to bathroom, and highly doubt she has functional status that would allow her to shower and make food. Discussed extra help with [name] CM [name] and she is going to arrange  home care providers to support with extra hours for a  home health aid, this would not completely cover 24/7 supervision.  It provides 17 2-hour visits. Patient's brother [name] [place] lives in [place] came down last week, and is calling later this afternoon. CM and CSW will have frank discussion suggesting he come to provide assistance or take her home with him when he calls.     Action/Plan:   Expected Discharge Date:                  Expected Discharge Plan:  Home w Home Health Services  In-House Referral:  Clinical Social Work  Discharge planning Services  CM Consult  Post Acute Care Choice:    Choice offered to:     DME Arranged:    DME Agency:     HH Arranged:    HH Agency:     Status of Service:  In process, will continue to follow  If discussed at Long Length of Stay Meetings, dates discussed:    Additional Comments:  [name], RN 04/11/2018, 1:46 PM

## 2018-04-11 NOTE — Consult Note (Addendum)
   [facility] In[facility] Consult   04/11/2018  [name] 03/27/1951 [id number]   [facility] Care Management follow up.  Made aware by [facility] NP that SNF was denied by HTA.  Confirmed with (covering) inpatient RNCM that [facility] Made aware that family is in the process of applying for Medicaid for eventual long term placement. Discussed that writer will make referral for [facility] for additional aide services for when she is at home.  Referral made to April with [facility] for additional aide services which may include up to 17 visits for 2 hrs. Faxed required information to [facility] and telephone call to April to confirm fax was received.   Went to bedside to speak with [name] to make aware that [facility] NP will follow up with her post discharge along with [facility] LCSW. Also made her aware that referral was given to [facility] for additional aid support. [name] expressed appreciation.  Explained that these services will not interfere or replace services provided by home health.  Will make referral for [facility] NP and [facility] LCSW as well. covering inpatient RNCM aware [facility] to follow.  Updated [facility] Care [facility] written consent obtained. Emergency contacts: Monia PouchSteven Supthen (brother) [phone] Marta AntuFrances Supthen (sister) [phone] Ermalene SearingDeborah Hodges (friend) [phone]  Patient's cell phone number is [phone].  AddendumLeonor Liv: Holt with [facility] called to speak with [name] on writer's speaker phone to explain aide services and to schedule appointment for home assessment on 04/13/18 at 930am if patient has been discharged. [name] agreeable.    [name], MSN-Ed, RN,BSN [facility] Care Management Hospital Liaison [phone]

## 2018-04-11 NOTE — Social Work (Addendum)
CSW spoke with [employer], after completion of a peer-to-peer review the medical director Dr. Lovena LeSteven Evans [phone]) they have assessed that [name] does not meet criteria for SNF care.  They believe [name] physical needs currently are chronic and means that [name] needs a higher level of care than can be provided at [facility] and will likely need long term care.   CSW and RN Case Manager then spoke with [name] sister [name] who lives in [place]. CSW and RN Case Manager discussed that insurance company is not going to cover SNF stay; the options would be to private pay and for [name] family to support with [name]. [name] sister states understanding, will call her brother [name] and inform him of current placement challenges. [name] sister states understanding that Home Health services are not 24/7 and that they will not be able to provide assistance with meals, or other household activities. [name] sister states that there are neighbors and friends close by that could come check on [name] but also not provide 24/7 assistance. CSW and RN Case Management will continue to follow to arrange services as available in the home. [name] sister will contact CSW and RN Case Management once she has talked with [name] brother.   4:56pm- CSW and RN Case Manager again spoke with [name] sister [name], she is working with [facility] to see if they will be able to arrange private duty care in the home for [name]; she understands that [name] is medically appropriate for discharge and will need to arrange these services while she works on another placement for [name].   Continuing to follow.   Doy HutchingIsabel H [name], LCSWA [facility] Clinical Social Work [phone]

## 2018-04-11 NOTE — Progress Notes (Signed)
PROGRESS NOTE    [name]  QIO:[id number] DOB: 30-Mar-1951 DOA: 04/02/2018 PCP: Jarome MatinPaterson, Daniel, MD   Brief Narrative: Patient is a 67 year old female with PMH significant for Atrial fib on Eliquis, OSA, and neuropathy. Her recent medical course has been complicated by ongoing foot wounds/ulcers. She was admitted for this back in April of this year at what time she was found to have and abscess on the R foot and underwent surgical I&D. Cultures were negative. She was treated with an extended course of IV antibiotics, Then off and on oral antibiotics. She has been followed by Dr. Ninetta LightsHatcher in the ID clinic. Also followed by Dr[facility] Victorino [facility]ikeHewitt [facility] who felt nothing to do surgically in May. Most recently she was started on Doxycyline 6/21 at ortho clinic as she had continued discharge. ID felt she may need further MRI.  Then on 7/8 she was found down by home health aide. She was last known well Friday (7/5) afternoon. In the ED she was alert, but only intermittently answering yes/no questions. Sent for CT scan which was concerning for infarct vs PRES. Her BP was borderline high, but diastolic notably high. She was started on cleviprex infusion. She had MRI, which was more consistent with PRES. She was initially admitted to the intensive care unit. She was stabilized and then transferred to the floor. Patient was also evaluated by infectious disease for her bilateral chronic ulcers in the lower extremities and cardiology for A. fib with RVR during this hospitalization.  Antibiotics has been discontinued as her bilateral chronic lower extremity ulcers have improved.  The plan is to follow-up with EP as an outpatient regarding her A. fib with RVR.  She has been started on amiodarone and  resumed on Eliquis. She was also evaluated by orthopedics and neurology. Initially there was plan to discharge her to skilled nursing facility but it has been denied.  Currently the plan is to  discharge to home with home health.  Case manager is discussing with family member to ensure supervision/assistance at home.  Following problems were addressed during her hospitalization:  Acute metabolic encephalopathy Thought to be secondary to PRES. Seen by neurology. No seizure activity noted on continuous EEG. Mental status has significantly improved. Neurology has signed off. She is likely close to her baseline. Petechial hemorrhage in right occipital lobe noted on imaging studies. Anticoagulation was placed on hold. Cleared by neurology to resume anticoagulation. Patient seems to be stable. She has made significant progress but remains distracted.  Atrial fibrillation with RVR Patient with known history of atrial fibrillation. Followed by Dr. Ladona Ridgelaylor. She was on carvedilol and Tikosyn previously. These medications were initially held. Anticoagulation had to be stopped due to presence of petechial hemorrhage on neuroimaging studies. Patient was continued on carvedilol. Dose had to be increased for poorly controlled heart rate. Seen by electrophysiology.They recommend keeping her off of the Tikosyn for now. Anticoagulation resumed after cleared by neurology. She was also started on amiodarone while she was in the ICU which is being continued. Heart rate at times noted to be poorly controlled. Low-dose Cardizem initiated as recommended by cardiology.  Outpatient follow-up with electrophysiology. EKG did show mildly prolonged QT interval.   Bilateral feet ulcers with metatarsal osteomyelitis Orthopedics has seen the patient. MRI of both the right and left foot were performed. No clear evidence of osteomyelitis was noted. Nonspecific changes were seen. Patient remains on meropenem. Infectious disease is also following. Discussed with Dr. Lajoyce Cornersuda on 7/15. No  plans for surgery during this hospitalization. He will follow-up the patient in his office in 2 weeks. Patient  also reevaluated by infectious disease  and taken off of antibiotics due to improvement in the wounds.Continue wound care on discharge.  ESBL E. coli UTI Patient completed a course of meropenem. Foley catheter was discontinued.  Diarrhea Appears to have resolved. Most likely due to antibiotics. She had a rectal tube which was discontinued as well.   Demand ischemia Elevated troponin noted. Troponin peaked at 2.15. Thought to be due to demand ischemia. Patient denies any chest pain currently. Echocardiogram shows normal systolic function without any wall motion abnormalities. No further work-up at this time.  Acute renal failure/hypokalemia Resolved.  Obstructive sleep apnea Unclear if she was on CPAP.  Normocytic anemia Anemia panel reviewed. Ferritin 32. Folate 6.1. B12 371. Hemoglobin is stable. Continue with multivitamin.   Patient with history of depression and anxiety Noted to be on multiple psychotropic medications at home including benztropine, Wellbutrin, gabapentin, Trintellix and as needed Remeron for insomnia. Since she is recovering from being severely encephalopathic we will resume thesemedications gradually. Hold the gabapentin and the Remeron for now.    Assessment & Plan:   Active Problems:   Osteomyelitis of great toe of left foot (HCC)   PRES (posterior reversible encephalopathy syndrome)   Pressure injury of skin   Sepsis (HCC)   Atrial fibrillation with RVR (HCC)   Wound of left leg    DVT prophylaxis:Eliquis Code Status: Full Family Communication: None present at the bedside Disposition Plan: Discharge to home with home health after discussion with family members so that she can be fully supervised at home   Consultants: ID, orthopedics, cardiology, neurology  Antimicrobials:None  Subjective: Patient seen and examined the bedside this morning.  Remains comfortable.  No new issues/events.  Denies any problems or  complaints.  Objective: Vitals:   04/10/18 2328 04/11/18 0334 04/11/18 0727 04/11/18 1118  BP: 125/73 129/86 (!) 137/97 137/87  Pulse: 76 78 86 (!) 58  Resp: 20 18 18 18   Temp: 97.9 F (36.6 C) 98.1 F (36.7 C) 97.8 F (36.6 C) 97.7 F (36.5 C)  TempSrc: Oral Oral Oral Oral  SpO2: 99% 99% 99% 100%  Weight:  100.6 kg (221 lb 12.5 oz)    Height:        Intake/Output Summary (Last 24 hours) at 04/11/2018 1453 Last data filed at 04/11/2018 1300 Gross per 24 hour  Intake 960 ml  Output 400 ml  Net 560 ml   Filed Weights   04/08/18 0530 04/09/18 0343 04/11/18 0334  Weight: 102 kg (224 lb 13.9 oz) 102.3 kg (225 lb 8.5 oz) 100.6 kg (221 lb 12.5 oz)    Examination:  General exam: Appears calm and comfortable ,Not in distress,average built HEENT:PERRL,Oral mucosa moist, Ear/Nose normal on gross exam Respiratory system: Bilateral equal air entry, normal vesicular breath sounds, no wheezes or crackles  Cardiovascular system: S1 & S2 heard, . No JVD, murmurs, rubs, gallops or clicks. No pedal edema. Gastrointestinal system: Abdomen is nondistended, soft and nontender. No organomegaly or masses felt. Normal bowel sounds heard. Central nervous system: Alert and oriented. No focal neurological deficits. Extremities: No edema, no clubbing ,no cyanosis, distal peripheral pulses palpable.  Ulcerations on bilateral feet.  Ulcers are clean and look noninfected.  Wrapped with dressings Skin: No rashes, lesions or ulcers,no icterus ,no pallor MSK: Normal muscle bulk,tone ,power Psychiatry: Judgement and insight appear normal. Mood & affect appropriate.  Data Reviewed: I have personally reviewed following labs and imaging studies  CBC: Recent Labs  Lab 04/06/18 0237 04/08/18 0634 04/10/18 0519  WBC 8.7 7.9 7.8  HGB 10.4* 10.0* 10.7*  HCT 35.1* 33.7* 35.9*  MCV 84.0 84.0 84.5  PLT 324 345 384   Basic Metabolic Panel: Recent Labs  Lab 04/05/18 0436 04/06/18 0237 04/06/18 1838  04/08/18 0634 04/10/18 0519  NA 133* 137 135 135 137  K 3.1* 3.1* 3.5 3.4* 4.1  CL 95* 100 102 103 103  CO2 25 27 24 25 27   GLUCOSE 118* 111* 121* 106* 106*  BUN 13 24* 20 19 17   CREATININE 0.94 1.04* 1.07* 0.82 0.85  CALCIUM 8.3* 8.2* 8.3* 8.3* 8.6*  MG 1.8 2.0  --   --   --   PHOS 2.8 3.1  --   --   --    GFR: Estimated Creatinine Clearance: 76.5 mL/min (by C-G formula based on SCr of 0.85 mg/dL). Liver Function Tests: No results for input(s): AST, ALT, ALKPHOS, BILITOT, PROT, ALBUMIN in the last 168 hours. No results for input(s): LIPASE, AMYLASE in the last 168 hours. No results for input(s): AMMONIA in the last 168 hours. Coagulation Profile: No results for input(s): INR, PROTIME in the last 168 hours. Cardiac Enzymes: No results for input(s): CKTOTAL, CKMB, CKMBINDEX, TROPONINI in the last 168 hours. BNP (last 3 results) No results for input(s): PROBNP in the last 8760 hours. HbA1C: No results for input(s): HGBA1C in the last 72 hours. CBG: Recent Labs  Lab 04/10/18 2004 04/10/18 2327 04/11/18 0334 04/11/18 0725 04/11/18 1115  GLUCAP 92 125* 98  98 95 93   Lipid Profile: No results for input(s): CHOL, HDL, LDLCALC, TRIG, CHOLHDL, LDLDIRECT in the last 72 hours. Thyroid Function Tests: No results for input(s): TSH, T4TOTAL, FREET4, T3FREE, THYROIDAB in the last 72 hours. Anemia Panel: No results for input(s): VITAMINB12, FOLATE, FERRITIN, TIBC, IRON, RETICCTPCT in the last 72 hours. Sepsis Labs: No results for input(s): PROCALCITON, LATICACIDVEN in the last 168 hours.  Recent Results (from the past 240 hour(s))  Blood Culture (routine x 2)     Status: None   Collection Time: 04/02/18  4:17 PM  Result Value Ref Range Status   Specimen Description BLOOD RIGHT ANTECUBITAL  Final   Special Requests   Final    BOTTLES DRAWN AEROBIC ONLY Blood Culture results may not be optimal due to an inadequate volume of blood received in culture bottles   Culture   Final     NO GROWTH 5 DAYS Performed at [facility], [address], [place], [place] [address]    Report Status 04/07/2018 FINAL  Final  Culture, blood (Routine X 2) w Reflex to ID Panel     Status: None   Collection Time: 04/02/18  8:21 PM  Result Value Ref Range Status   Specimen Description BLOOD LEFT HAND  Final   Special Requests   Final    BOTTLES DRAWN AEROBIC AND ANAEROBIC Blood Culture results may not be optimal due to an inadequate volume of blood received in culture bottles   Culture   Final    NO GROWTH 5 DAYS Performed at [facility], [address], [place], [place] [address]    Report Status 04/07/2018 FINAL  Final  Urine culture     Status: Abnormal   Collection Time: 04/02/18  8:57 PM  Result Value Ref Range Status   Specimen Description URINE, CATHETERIZED  Final  Special Requests NONE  Final   Culture (A)  Final    >=100,000 COLONIES/mL ESCHERICHIA COLI Confirmed Extended Spectrum Beta-Lactamase Producer (ESBL).  In bloodstream infections from ESBL organisms, carbapenems are preferred over piperacillin/tazobactam. They are shown to have a lower risk of mortality. Performed at [facility], [address], [place], [place] [address]    Report Status 04/05/2018 FINAL  Final   Organism ID, Bacteria ESCHERICHIA COLI (A)  Final      Susceptibility   Escherichia coli - MIC*    AMPICILLIN >=32 RESISTANT Resistant     CEFAZOLIN >=64 RESISTANT Resistant     CEFTRIAXONE >=64 RESISTANT Resistant     CIPROFLOXACIN >=4 RESISTANT Resistant     GENTAMICIN >=16 RESISTANT Resistant     IMIPENEM <=0.25 SENSITIVE Sensitive     NITROFURANTOIN <=16 SENSITIVE Sensitive     TRIMETH/SULFA <=20 SENSITIVE Sensitive     AMPICILLIN/SULBACTAM >=32 RESISTANT Resistant     PIP/TAZO <=4 SENSITIVE Sensitive     Extended ESBL POSITIVE Resistant     * >=100,000 COLONIES/mL ESCHERICHIA COLI  MRSA PCR Screening     Status: None   Collection Time: 04/02/18 11:02 PM  Result Value  Ref Range Status   MRSA by PCR NEGATIVE NEGATIVE Final    Comment:        The GeneXpert MRSA Assay (FDA approved for NASAL specimens only), is one component of a comprehensive MRSA colonization surveillance program. It is not intended to diagnose MRSA infection nor to guide or monitor treatment for MRSA infections. Performed at [facility], [address] [place], [place] [address]          Radiology Studies: No results found.      Scheduled Meds: . amiodarone  200 mg Oral BID  . apixaban  5 mg Oral BID  . benztropine  1.5 mg Oral Daily  . buPROPion  150 mg Oral BID  . carvedilol  25 mg Oral BID WC  . chlorhexidine  15 mL Mouth Rinse BID  . diltiazem  30 mg Oral Q12H  . feeding supplement (PRO-STAT SUGAR FREE 64)  30 mL Oral TID  . mouth rinse  15 mL Mouth Rinse q12n4p  . multivitamins with iron  1 tablet Oral Daily  . saccharomyces boulardii  250 mg Oral BID  . vortioxetine HBr  5 mg Oral Daily   Continuous Infusions: . sodium chloride 20 mL/hr at 04/05/18 1948     LOS: 9 days    Time spent: More than 50% of that time was spent in counseling and/or coordination of care.      Burnadette Pop, MD [facility] Pager [phone]  If 7PM-7AM, please contact night-coverage [email or web address] Password [facility] 04/11/2018, 2:53 PM

## 2018-04-11 NOTE — Progress Notes (Signed)
Occupational Therapy Treatment Patient Details Name: [name] MRN: [id number] DOB: 17-Jul-1951 Today's Date: 04/11/2018    History of present illness 67 y.o. female found on ground by home health aide, admitted MRI consistent with PRES. PMH includes: A fib, OSA, neuropathy, ongoing foot wound/ulcers., Obesity, HTN, urinary incontinence, B TKA.    OT comments  Pt sat EOB with max A to participate in activity. Pt required increased time to complete bed mobility due to Poor attention and sequencing deficits. Pt with Poor attention to tasks and required max redirection to stay on task with assist to initiate and complete simple grooming and UB ADLs. OT will continue to follow acutely  Follow Up Recommendations  SNF    Equipment Recommendations  None recommended by OT;Other (comment)(TBD at SNF)    Recommendations for Other Services      Precautions / Restrictions Precautions Precautions: Fall Restrictions Weight Bearing Restrictions: No       Mobility Bed Mobility Overal bed mobility: Needs Assistance Bed Mobility: Rolling;Supine to Sit;Sit to Supine Rolling: Mod assist   Supine to sit: Max assist Sit to supine: Max assist   General bed mobility comments: assist with LE management and trunk control  Transfers                 General transfer comment: deferred    Balance Overall balance assessment: Needs assistance Sitting-balance support: No upper extremity supported;Feet supported Sitting balance-Leahy Scale: Fair                                     ADL either performed or assessed with clinical judgement   ADL Overall ADL's : Needs assistance/impaired     Grooming: Wash/dry hands;Wash/dry face;Bed level;Moderate assistance;Cueing for sequencing   Upper Body Bathing: Maximal assistance;Sitting;Cueing for sequencing Upper Body Bathing Details (indicate cue type and reason): simulated     Upper Body Dressing : Maximal  assistance;Sitting;Cueing for sequencing                     General ADL Comments: pt with Poor attention to tasks and required max redirection to stay on task with assist to initiate and complete simple grooming and UB ADLs     Vision Patient Visual Report: (unable to properly assess due to cognition)     Perception     Praxis      Cognition Arousal/Alertness: Awake/alert Behavior During Therapy: Flat affect Overall Cognitive Status: Impaired/Different from baseline Area of Impairment: Orientation;Attention;Following commands;Problem solving                 Orientation Level: Disoriented to;Situation;Time     Following Commands: Follows one step commands inconsistently;Follows one step commands with increased time     Problem Solving: Slow processing;Decreased initiation;Difficulty sequencing;Requires verbal cues;Requires tactile cues General Comments: requires consistent cueing during conversation, does not answer simlpe questions appropriately        Exercises     Shoulder Instructions       General Comments      Pertinent Vitals/ Pain       Pain Assessment: No/denies pain  Home Living                                          Prior Functioning/Environment  Frequency  Min 2X/week        Progress Toward Goals  OT Goals(current goals can now be found in the care plan section)  Progress towards OT goals: Progressing toward goals     Plan Discharge plan remains appropriate    Co-evaluation                 AM-PAC PT "6 Clicks" Daily Activity     Outcome Measure   Help from another person eating meals?: Total Help from another person taking care of personal grooming?: A Lot Help from another person toileting, which includes using toliet, bedpan, or urinal?: Total Help from another person bathing (including washing, rinsing, drying)?: Total Help from another person to put on and taking off  regular upper body clothing?: A Lot Help from another person to put on and taking off regular lower body clothing?: Total 6 Click Score: 8    End of Session    OT Visit Diagnosis: Unsteadiness on feet (R26.81);Muscle weakness (generalized) (M62.81);Other symptoms and signs involving cognitive function   Activity Tolerance Patient tolerated treatment well   Patient Left in bed;with call bell/phone within reach;with bed alarm set   Nurse Communication      Functional Assessment Tool Used: AM-PAC 6 Clicks Daily Activity   Time: 9562-1308 OT Time Calculation (min): 18 min  Charges: OT G-codes **NOT FOR INPATIENT CLASS** Functional Assessment Tool Used: AM-PAC 6 Clicks Daily Activity OT General Charges $OT Visit: 1 Visit OT Treatments $Therapeutic Activity: 8-22 mins     Galen Manila 04/11/2018, 1:13 PM

## 2018-04-11 NOTE — Discharge Summary (Signed)
Physician Discharge Summary  [name] ZOX:[id number] DOB: 1951/09/09 DOA: 04/02/2018  PCP: Jarome Matin, MD  Admit date: 04/02/2018 Discharge date: 04/14/2018  Admitted From: Home Disposition:  Home  Discharge Condition:Stable CODE STATUS:FULL Diet recommendation: Heart Healthy  Brief/Interim Summary: Patient is a  67 year old female with PMH significant for Atrial fib on Eliquis, OSA, and neuropathy. Her recent medical course has been complicated by ongoing foot wounds/ulcers. She was admitted for this back in April of this year at what time she was found to have and abscess on the R foot and underwent surgical I&D. Cultures were negative. She was treated with an extended course of IV antibiotics, Then off and on oral antibiotics. She has been followed by Dr. Ninetta Lights in the ID clinic. Also followed by Dr. Victorino Dike with [facility] who felt nothing to do surgically in May. Most recently she was started on Doxycyline 6/21 at ortho clinic as she had continued discharge. ID felt she may need further MRI.  Then on  7/8 she was found down by home health aide. She was last known well Friday (7/5) afternoon. In the ED she was alert, but only intermittently answering yes/no questions. Sent for CT scan which was concerning for infarct vs PRES. Her BP was borderline high, but diastolic notably high. She was started on cleviprex infusion. She had MRI, which was more consistent with PRES. She was initially admitted to the intensive care unit.  She was stabilized and then transferred to the floor. Patient was also evaluated by infectious disease for her bilateral chronic ulcers in the lower extremities and cardiology for A. fib with RVR during this hospitalization.  Antibiotics has been discontinued as her bilateral chronic lower extremity ulcers have improved.  The plan is to follow-up with EP as an outpatient regarding her A. fib with RVR.  She has been started on amiodarone and  resumed on  Eliquis. She was also evaluated by orthopedics and neurology. She is going to be discharged to skilled nursing facility today.  Following problems were addressed during her hospitalization:  Acute metabolic encephalopathy Thought to be secondary to PRES. Seen by neurology.  No seizure activity noted on continuous EEG.  Mental status has significantly improved.  Neurology has signed off.  She is likely close to her baseline.  Petechial hemorrhage in right occipital lobe noted on imaging studies.  Anticoagulation was placed on hold.  Cleared by neurology to resume anticoagulation.  Patient seems to be stable.  She has made significant progress but remains distracted.She should follow-up with neurology on discharge.  Atrial fibrillation with RVR Patient with known history of atrial fibrillation.  Followed by Dr. Ladona Ridgel.  She was on carvedilol and Tikosyn previously.  These medications were initially held.  Anticoagulation had to be stopped due to presence of petechial hemorrhage on neuroimaging studies.  Patient was continued on carvedilol.  Dose had to be increased for poorly controlled heart rate.  Seen by electrophysiology.  They recommend keeping her off of the Tikosyn for now.  Anticoagulation resumed after cleared by neurology.  She was also started on amiodarone while she was in the ICU which is being continued.  Heart rate at times noted to be poorly controlled.  Low-dose Cardizem initiated as recommended by cardiology.   Outpatient follow-up with electrophysiology.  EKG did show mildly prolonged QT interval.    Bilateral feet ulcers with metatarsal osteomyelitis Orthopedics has seen the patient.  MRI of both the right and left foot were performed.  No clear evidence of osteomyelitis was noted.  Nonspecific changes were seen.   Infectious disease was also following.  Discussed with Dr. Lajoyce Corners on 7/15.  No plans for surgery during this hospitalization.  He will follow-up the patient in his office in  2 weeks.  Patient also reevaluated by infectious disease  and taken off of antibiotics due to improvement in the wounds.  Continue wound care on discharge.  ESBL E. coli UTI Patient completed a course of meropenem.  Foley catheter was discontinued.  Diarrhea Appears to have resolved.  Most likely due to antibiotics.  She had a rectal tube which was discontinued as well.     Demand ischemia Elevated troponin noted.  Troponin peaked at 2.15.  Thought to be due to demand ischemia.  Patient denies any chest pain currently.  Echocardiogram shows normal systolic function without any wall motion abnormalities.  No further work-up at this time.  Acute renal failure/hypokalemia Resolved.    Obstructive sleep apnea Unclear if she was on CPAP.  Normocytic anemia Anemia panel reviewed.  Ferritin 32.  Folate 6.1.  B12 371.  Hemoglobin is stable.  Continue with multivitamin.    Patient with history of depression and anxiety Noted to be on multiple psychotropic medications at home including benztropine, Wellbutrin, gabapentin, Trintellix and as needed Remeron for insomnia.  Since she is recovering from being severely encephalopathic we will resume these medications gradually.  Hold the gabapentin and the Remeron for now.     Discharge Diagnoses:  Active Problems:   Osteomyelitis of great toe of left foot (HCC)   PRES (posterior reversible encephalopathy syndrome)   Pressure injury of skin   Sepsis (HCC)   Atrial fibrillation with RVR (HCC)   Wound of left leg    Discharge Instructions  Discharge Instructions    AMB Referral to [facility] Care Management   Complete by:  As directed    Please assign HTA member to [facility] NP Noralyn Pick already aware of pt) and [facility] LCSW for long term plans and Medicaid application follow up. SNF was denied. Written consent obtained. Currently at [facility]. Home Care Providers referral has already been made. High risk for readmit. Please call with questions. Raiford Noble, MSN-Ed, RN,BSN [facility] [phone]   Reason for consult:  Please assign to [facility] Nurse Practitoner and [facility] LCSW   Diagnoses of:  Other   Other Diagnosis:  AFIB WITH RVR   Expected date of contact:  1-3 days (reserved for hospital discharges)   Ambulatory referral to Neurology   Complete by:  As directed    An appointment is requested in approximately: 2 weeks   Diet - low sodium heart healthy   Complete by:  As directed    Discharge instructions   Complete by:  As directed    1) Please follow up with a PCP as an outpatient in a week.  Do a CBC and BMP testing during the follow-up. 2) Follow up with orthopedics, cardiology and neurology as an outpatient. 3) Take prescribed medications as instructed.   Increase activity slowly   Complete by:  As directed      Allergies as of 04/14/2018      Reactions   Zolpidem Tartrate Other (See Comments)   Delusions, hallucinations   Amoxicillin Rash, Other (See Comments)   Has patient had a PCN reaction causing immediate rash, facial/tongue/throat swelling, SOB or lightheadedness with hypotension: Yes Has patient had a PCN reaction causing severe rash involving mucus membranes  or skin necrosis: No Has patient had a PCN reaction that required hospitalization: No Has patient had a PCN reaction occurring within the last 10 years: Yes If all of the above answers are "NO", then may proceed with Cephalosporin use.      Medication List    STOP taking these medications   acetaminophen 325 MG tablet Commonly known as:  TYLENOL   acyclovir 400 MG tablet Commonly known as:  ZOVIRAX   amLODipine 5 MG tablet Commonly known as:  NORVASC   azithromycin 250 MG tablet Commonly known as:  ZITHROMAX   cefUROXime 500 MG tablet Commonly known as:  CEFTIN   dofetilide 250 MCG capsule Commonly known as:  TIKOSYN   doxycycline 100 MG tablet Commonly known as:  VIBRA-TABS   gabapentin 600 MG tablet Commonly known as:   NEURONTIN   hydrALAZINE 50 MG tablet Commonly known as:  APRESOLINE   lisinopril 40 MG tablet Commonly known as:  PRINIVIL,ZESTRIL   mirtazapine 15 MG tablet Commonly known as:  REMERON   traMADol 50 MG tablet Commonly known as:  ULTRAM     TAKE these medications   amiodarone 200 MG tablet Commonly known as:  PACERONE Take 1 tablet (200 mg total) by mouth 2 (two) times daily.   benztropine 0.5 MG tablet Commonly known as:  COGENTIN Take 1.5 mg by mouth daily.   buPROPion 150 MG 24 hr tablet Commonly known as:  WELLBUTRIN XL Take 150 mg by mouth 2 (two) times daily.   carvedilol 25 MG tablet Commonly known as:  COREG Take 1 tablet (25 mg total) by mouth 2 (two) times daily with a meal. What changed:    medication strength  how much to take  when to take this  Another medication with the same name was removed. Continue taking this medication, and follow the directions you see here.   cyanocobalamin 1000 MCG/ML injection Commonly known as:  (VITAMIN B-12) Inject 1,000 mcg into the muscle every 30 (thirty) days.   diltiazem 30 MG tablet Commonly known as:  CARDIZEM Take 1 tablet (30 mg total) by mouth every 12 (twelve) hours.   ELIQUIS 5 MG Tabs tablet Generic drug:  apixaban Take 5 mg by mouth 2 (two) times daily.   feeding supplement (PRO-STAT SUGAR FREE 64) Liqd Take 30 mLs by mouth 3 (three) times daily.   fluticasone 50 MCG/ACT nasal spray Commonly known as:  FLONASE Place 1 spray into both nostrils daily as needed for allergies.   iron polysaccharides 150 MG capsule Commonly known as:  NIFEREX Take 150 mg by mouth daily.   Magnesium Oxide 400 MG Caps Take 1 capsule (400 mg total) by mouth daily.   MULTIVITAMIN & MINERAL PO Take 1 tablet by mouth daily.   pravastatin 40 MG tablet Commonly known as:  PRAVACHOL Take 40 mg by mouth daily.   saccharomyces boulardii 250 MG capsule Commonly known as:  FLORASTOR Take 1 capsule (250 mg total) by  mouth 2 (two) times daily.   TRINTELLIX 5 MG Tabs tablet Generic drug:  vortioxetine HBr Take 5 mg by mouth daily.       Contact information for follow-up providers    Nadara Mustard, MD Follow up in 2 week(s).   Specialty:  Orthopedic Surgery Contact information: [address] [place] [address] [phone]        Marinus Maw, MD. Schedule an appointment as soon as possible for a visit in 2 week(s).   Specialty:  Cardiology Contact information: [address] [place] [address] [phone]        [facility]. Schedule an appointment as soon as possible for a visit in 2 week(s).   Specialty:  Neurology Contact information: [address] [place] [address] [phone]           Contact information for after-discharge care    Destination    [facility] .   Service:  Skilled Nursing Contact information: [address] [place] [address] [phone]                 Allergies  Allergen Reactions  . Zolpidem Tartrate Other (See Comments)    Delusions, hallucinations  . Amoxicillin Rash and Other (See Comments)    Has patient had a PCN reaction causing immediate rash, facial/tongue/throat swelling, SOB or lightheadedness with hypotension: Yes Has patient had a PCN reaction causing severe rash involving mucus membranes or skin necrosis: No Has patient had a PCN reaction that required hospitalization: No Has patient had a PCN reaction occurring within the last 10 years: Yes If all of the above answers are "NO", then may proceed with Cephalosporin use.     Consultations: Infectious disease, cardiology, orthopedics, neurology  Procedures/Studies: Ct Head Wo Contrast  Result Date: 04/02/2018 CLINICAL DATA:  Found down. Last seen normal 3 days ago. History of hypertension, hyperlipidemia. EXAM: CT HEAD WITHOUT CONTRAST TECHNIQUE:  Contiguous axial images were obtained from the base of the skull through the vertex without intravenous contrast. COMPARISON:  CT HEAD March 22, 2017 FINDINGS: Moderate motion degraded examination. BRAIN: Symmetric occipital lobe wedge-like hypodensities with focal blurring of the gray-white matter junction in RIGHT subcentimeter petechial hemorrhage. 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 Moderate parenchymal brain volume loss. No hydrocephalus. Patchy to confluent supratentorial white matter hypodensities. No midline shift or mass effect. No abnormal extra-axial fluid collections. VASCULAR: Moderate calcific atherosclerosis of the carotid siphons. SKULL: No skull fracture. No significant scalp soft tissue swelling. SINUSES/ORBITS: Trace RIGHT mastoid effusion.The included ocular globes and orbital contents are non-suspicious. OTHER: None. IMPRESSION: 1. Bilateral occipital lobe/PCA territory acute infarcts versus PRES. RIGHT occipital lobe petechial hemorrhage. 2. Small potentially acute LEFT MCA territory infarct or PRES. 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 3. Moderate parenchymal brain volume loss. Moderate chronic small vessel ischemic changes. 4. Acute findings discussed with and reconfirmed by Dr.Long on 04/02/2018 at 5:18 pm. Electronically Signed   By: Awilda Metro M.D.   On: 04/02/2018 17:20   Mr Maxine Glenn Head Wo Contrast  Result Date: 04/04/2018 CLINICAL DATA:  67 y/o  F; encephalopathy. EXAM: MRI HEAD WITHOUT AND WITH CONTRAST MRA HEAD WITHOUT CONTRAST TECHNIQUE: Multiplanar, multiecho pulse sequences of the brain and surrounding structures were obtained without and with intravenous contrast. Angiographic images of the head were obtained using MRA technique without contrast. CONTRAST:  18 cc MultiHance COMPARISON:  04/02/2018 MRI head. FINDINGS: MRI HEAD FINDINGS Brain: Few punctate foci of reduced diffusion in the right parietal cortex. Diffusion signal abnormality  is decreased when compared with the prior MRI of the head. T2 FLAIR hyperintense signal abnormality involving subcortical white matter of the posterior frontal, parietal, and occipital lobes going cortex in the occipital lobes. Signal abnormality is stable in distribution when compared with prior motion degraded MRI. 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 Stable background of moderate chronic microvascular ischemic changes and parenchymal volume  loss of the brain. No abnormal susceptibility hypointensity to indicate intracranial hemorrhage. No extra-axial collection, hydrocephalus, or herniation. No focal mass effect of the brain. Vascular: Normal flow voids. Skull and upper cervical spine: Normal marrow signal. Sinuses/Orbits: Mild ethmoid sinus mucosal thickening and partial opacification of mastoid air cells, probably due to nasoenteric tube. No abnormal signal of orbits. Other: None. MRA HEAD FINDINGS Internal carotid arteries:  Patent. Anterior cerebral arteries:  Patent. Middle cerebral arteries: Patent. Anterior communicating artery: Patent. Posterior communicating arteries: Right fetal PCA. No left posterior communicating artery identified, likely hypoplastic or absent. Posterior cerebral arteries:  Patent. Basilar artery:  Patent. Vertebral arteries:  Patent.  Left dominant. Motion degraded study, suboptimal assessment for small aneurysm or stenosis. No large aneurysm. IMPRESSION: MRI head: 1. Stable distribution of T2 FLAIR signal abnormality involving subcortical white matter and cortex in the posterior distribution. Diminished diffusion signal abnormality. 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 2. No new acute intracranial abnormality identified. 3. 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 MRA head: 1. Motion degraded study, suboptimal assessment for  small aneurysm or stenosis. 2. Patent anterior and posterior intracranial circulation. No large vessel occlusion. Electronically Signed   By: Mitzi Hansen M.D.   On: 04/04/2018 15:59   Mr Brain Wo Contrast  Result Date: 04/02/2018 CLINICAL DATA:  67 y/o F; found down. Altered level of consciousness. Abnormal CT head. EXAM: MRI HEAD WITHOUT CONTRAST TECHNIQUE: Multiplanar, multiecho pulse sequences of the brain and surrounding structures were obtained without intravenous contrast. COMPARISON:  04/02/2018 CT head.  11/08/2013 MRI head. FINDINGS: Brain: Severe motion degradation. There is T2 hyperintense signal abnormality involving cortex and subcortical white matter in a posterior distribution involving predominantly the occipital and parietal lobes. There is scattered intermittent associated cortical reduced diffusion. Prominent retrocerebellar extra-axial space, probably a small arachnoid cyst. Very small chronic infarcts are present within the inferior cerebellar hemispheres bilaterally. Stable background of moderate chronic microvascular ischemic changes and parenchymal volume loss of the brain. No gross susceptibility hypointensity to indicate intracranial hemorrhage. Vascular: Motion degraded. Skull and upper cervical spine: Normal marrow signal. Sinuses/Orbits: Bilateral sphenoid sinus effusions. No abnormal signal of mastoid air cells. Orbits are unremarkable. Other: None. IMPRESSION: 1. Severe motion degradation. 2. T2 hyperintense signal abnormality involving cortex and subcortical white matter in a posterior distribution. Scattered cortical reduced diffusion. Findings are consistent with PRES and atypical for ischemia. No gross acute hemorrhage identified. Electronically Signed   By: Mitzi Hansen M.D.   On: 04/02/2018 20:11   Mr Laqueta Jean ZO Contrast  Result Date: 04/04/2018 CLINICAL DATA:  67 y/o  F; encephalopathy. EXAM: MRI HEAD WITHOUT AND WITH CONTRAST MRA HEAD WITHOUT  CONTRAST TECHNIQUE: Multiplanar, multiecho pulse sequences of the brain and surrounding structures were obtained without and with intravenous contrast. Angiographic images of the head were obtained using MRA technique without contrast. CONTRAST:  18 cc MultiHance COMPARISON:  04/02/2018 MRI head. FINDINGS: MRI HEAD FINDINGS Brain: Few punctate foci of reduced diffusion in the right parietal cortex. Diffusion signal abnormality is decreased when compared with the prior MRI of the head. T2 FLAIR hyperintense signal abnormality involving subcortical white matter of the posterior frontal, parietal, and occipital lobes going cortex in the occipital lobes. Signal abnormality is stable in distribution when compared with prior motion degraded MRI. 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 Stable background of moderate chronic microvascular ischemic changes and parenchymal volume  loss of the brain. No abnormal susceptibility hypointensity to indicate intracranial hemorrhage. No extra-axial collection, hydrocephalus, or herniation. No focal mass effect of the brain. Vascular: Normal flow voids. Skull and upper cervical spine: Normal marrow signal. Sinuses/Orbits: Mild ethmoid sinus mucosal thickening and partial opacification of mastoid air cells, probably due to nasoenteric tube. No abnormal signal of orbits. Other: None. MRA HEAD FINDINGS Internal carotid arteries:  Patent. Anterior cerebral arteries:  Patent. Middle cerebral arteries: Patent. Anterior communicating artery: Patent. Posterior communicating arteries: Right fetal PCA. No left posterior communicating artery identified, likely hypoplastic or absent. Posterior cerebral arteries:  Patent. Basilar artery:  Patent. Vertebral arteries:  Patent.  Left dominant. Motion degraded study, suboptimal assessment for small aneurysm or stenosis. No large aneurysm. IMPRESSION: MRI head: 1. Stable distribution of T2 FLAIR signal  abnormality involving subcortical white matter and cortex in the posterior distribution. Diminished diffusion signal abnormality. 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 2. No new acute intracranial abnormality identified. 3. 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 MRA head: 1. Motion degraded study, suboptimal assessment for small aneurysm or stenosis. 2. Patent anterior and posterior intracranial circulation. No large vessel occlusion. Electronically Signed   By: Mitzi Hansen M.D.   On: 04/04/2018 15:59   Mr Foot Right W Wo Contrast  Result Date: 04/08/2018 CLINICAL DATA:  Diabetic foot ulcer at the base of the first toe. EXAM: MRI OF THE RIGHT FOREFOOT WITHOUT AND WITH CONTRAST TECHNIQUE: Multiplanar, multisequence MR imaging of the right forefoot was performed before and after the administration of intravenous contrast. CONTRAST:  20 cc MultiHance intravenous contrast. COMPARISON:  Right foot x-rays dated April 02, 2018. MRI right foot dated January 08, 2018. FINDINGS: Bones/Joint/Cartilage No marrow signal abnormality. No fracture. No joint effusion. Progressive neuropathic arthropathy of the midfoot. 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 Ligaments Collateral ligaments are intact. Muscles and Tendons Flexor, peroneal and extensor compartment tendons are intact. Diffuse fatty atrophy of the intrinsic muscles of the forefoot. Soft tissue Soft tissue ulceration at the plantar base of the first MTP joint with mild underlying phlegmonous change. Previously seen inflammatory changes in the first intermetatarsal space have resolved. No fluid collection or hematoma. No soft tissue mass. IMPRESSION: 1. Small soft tissue ulceration at the plantar base of the first MTP joint without evidence of underlying osteomyelitis or abscess. 2. Progressive neuropathic arthropathy of the  midfoot. Electronically Signed   By: Obie Dredge M.D.   On: 04/08/2018 09:26   Mr Foot Left W Wo Contrast  Result Date: 04/08/2018 CLINICAL DATA:  Diabetic foot ulcer near the fifth MTP joint. EXAM: MRI OF THE LEFT FOREFOOT WITHOUT AND WITH CONTRAST TECHNIQUE: Multiplanar, multisequence MR imaging of the left forefoot was performed both before and after administration of intravenous contrast. CONTRAST:  20mL MULTIHANCE GADOBENATE DIMEGLUMINE 529 MG/ML IV SOLN COMPARISON:  Left foot x-rays dated April 02, 2018. Left foot MRI dated December 08, 2017. FINDINGS: Bones/Joint/Cartilage No marrow signal abnormality. No fracture. Progressive dorsal subluxation of the fifth proximal phalanx with respect to the fifth metatarsal head. Tiny focus of reactive marrow edema in the dorsal base of the fifth proximal phalanx. Small fifth MTP joint effusion. Unchanged hallux valgus deformity with severe first MTP joint osteoarthritis. Unchanged juxtacortical erosion along the medial aspect of the first metatarsal head, possibly secondary to gout. Moderate midfoot osteoarthritis, similar to prior study. Ligaments Collateral ligaments are intact. Muscles and Tendons  Flexor, peroneal and extensor compartment tendons are intact. Diffuse atrophy of the intrinsic muscles of the forefoot. Soft tissue Soft tissue ulceration at the plantar base of the fifth metatarsal head. No fluid collection or hematoma. No soft tissue mass. IMPRESSION: 1. Soft tissue ulceration at the plantar base of the fifth metatarsal head without evidence of osteomyelitis. 2. Small fifth MTP joint effusion is nonspecific, but may be reactive given progressive dorsal subluxation at the fifth MTP joint. Electronically Signed   By: Obie DredgeWilliam T Derry M.D.   On: 04/08/2018 09:12   Dg Chest Port 1 View  Result Date: 04/05/2018 CLINICAL DATA:  Respiratory failure EXAM: PORTABLE CHEST 1 VIEW COMPARISON:  04/04/2018 FINDINGS: Mild improvement in bibasilar airspace disease.  Improved lung volume. Negative for heart failure or effusion. NG tube is in place with the tip in the stomach. IMPRESSION: Improvement in bibasilar atelectasis.  NG in stomach. Electronically Signed   By: Marlan Palauharles  Clark M.D.   On: 04/05/2018 07:00   Dg Chest Port 1 View  Result Date: 04/04/2018 CLINICAL DATA:  Respiratory failure with shortness of breath EXAM: PORTABLE CHEST 1 VIEW COMPARISON:  April 02, 2018 FINDINGS: There is mild bibasilar atelectasis. There is no edema or consolidation. The heart size and pulmonary vascularity are normal. No adenopathy. No bone lesions. IMPRESSION: Mild bibasilar atelectasis. Lungs elsewhere clear. Stable cardiac silhouette. Electronically Signed   By: Bretta BangWilliam  Woodruff III M.D.   On: 04/04/2018 07:09   Dg Chest Portable 1 View  Result Date: 04/02/2018 CLINICAL DATA:  Found down in her home today, hypoxia, history hypertension, former smoker EXAM: PORTABLE CHEST 1 VIEW COMPARISON:  Portable exam 1521 hours compared to 01/19/2018 FINDINGS: Normal heart size, mediastinal contours, and pulmonary vascularity. Numerous EKG leads project over chest. Lungs clear. No acute infiltrate, pleural effusion or pneumothorax. Bones unremarkable. IMPRESSION: No acute abnormalities. Electronically Signed   By: Ulyses SouthwardMark  Boles M.D.   On: 04/02/2018 16:08   Dg Abd Portable 1v  Result Date: 04/04/2018 CLINICAL DATA:  Nasogastric tube placement EXAM: PORTABLE ABDOMEN - 1 VIEW COMPARISON:  April 02, 2018 FINDINGS: Nasogastric tube tip and side port are in the stomach. There is no bowel dilatation or air-fluid level to suggest bowel obstruction. No free air. There are surgical clips in the upper abdomen and gastroesophageal junction regions. Lung bases are clear. IMPRESSION: Nasogastric tube tip and side port in stomach. Bowel gas pattern unremarkable. Electronically Signed   By: Bretta BangWilliam  Woodruff III M.D.   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 Calcified density in the left hemipelvis would be in keeping with the patient's calcified uterine fibroid. No bowel obstruction or significant dilatation no apparent free air. Degenerative change of the lower lumbar spine and left hip joint. IMPRESSION: Nonobstructed, nondistended bowel gas pattern. Pelvic calcification in keeping with the patient's known calcified uterine fibroid. Electronically Signed   By: Tollie Ethavid  Kwon M.D.   On: 04/02/2018 22:25   Dg Foot 2 Views Left  Result Date: 04/02/2018 CLINICAL DATA:  The patient was found down today. Wound to the lower extremity. EXAM: LEFT FOOT - 2 VIEW COMPARISON:  Radiographs dated 06/05/2017 and MRI dated 12/08/2017 FINDINGS: Prominent hallux valgus deformity. Chronic arthritic changes of the first MTP joint. Soft tissue swelling lateral to the fifth MTP joint. Slight osteopenia  at the fifth MTP joint without discrete bone destruction. Slight degenerative changes in the dorsum of the midfoot. IMPRESSION: No acute abnormalities. Degenerative changes as described. Chronic soft tissue swelling lateral to the fifth MTP joint. Electronically Signed   By: Francene Boyers M.D.   On: 04/02/2018 16:20   Dg Foot 2 Views Right  Result Date: 04/02/2018 CLINICAL DATA:  Wound on the right foot. EXAM: RIGHT FOOT - 2 VIEW COMPARISON:  Radiographs dated 01/07/2018 and 12/19/2017 and MRI dated 01/08/2018 and CT scan dated 06/08/2015 FINDINGS: There is a soft tissue ulceration on the plantar aspect of the ball of the foot the level of the first MTP joint. No acute bone abnormality. Chronic deformity at the fifth MTP joint. Chronic deformity in the midfoot from prior midfoot fractures. Those changes are stable. IMPRESSION: No acute bone abnormality. No radiographic evidence suggestive of osteomyelitis. Soft tissue  abscess on the ball of the foot as described. Electronically Signed   By: Francene Boyers M.D.   On: 04/02/2018 16:02      Subjective:   Discharge Exam: Vitals:   04/14/18 0342 04/14/18 0925  BP: (!) 146/76 102/84  Pulse: (!) 56 (!) 52  Resp: 18 14  Temp: 98.1 F (36.7 C) 98.1 F (36.7 C)  SpO2: 97% 99%   Vitals:   04/13/18 1954 04/13/18 2337 04/14/18 0342 04/14/18 0925  BP: 137/67 (!) 142/86 (!) 146/76 102/84  Pulse: (!) 59 (!) 58 (!) 56 (!) 52  Resp: 20 (!) 22 18 14   Temp: 98 F (36.7 C) 97.7 F (36.5 C) 98.1 F (36.7 C) 98.1 F (36.7 C)  TempSrc: Oral Oral Oral Oral  SpO2: 96% 96% 97% 99%  Weight:   99.6 kg (219 lb 9.3 oz)   Height:        General: Pt is alert, awake, not in acute distress Cardiovascular: RRR, S1/S2 +, no rubs, no gallops Respiratory: CTA bilaterally, no wheezing, no rhonchi Abdominal: Soft, NT, ND, bowel sounds + Extremities: no edema, no cyanosis,  healing ulcers on bilateral feet.    The results of significant diagnostics from this hospitalization (including imaging, microbiology, ancillary and laboratory) are listed below for reference.     Microbiology: No results found for this or any previous visit (from the past 240 hour(s)).   Labs: BNP (last 3 results) No results for input(s): BNP in the last 8760 hours. Basic Metabolic Panel: Recent Labs  Lab 04/08/18 0634 04/10/18 0519  NA 135 137  K 3.4* 4.1  CL 103 103  CO2 25 27  GLUCOSE 106* 106*  BUN 19 17  CREATININE 0.82 0.85  CALCIUM 8.3* 8.6*   Liver Function Tests: No results for input(s): AST, ALT, ALKPHOS, BILITOT, PROT, ALBUMIN in the last 168 hours. No results for input(s): LIPASE, AMYLASE in the last 168 hours. No results for input(s): AMMONIA in the last 168 hours. CBC: Recent Labs  Lab 04/08/18 0634 04/10/18 0519  WBC 7.9 7.8  HGB 10.0* 10.7*  HCT 33.7* 35.9*  MCV 84.0 84.5  PLT 345 384   Cardiac Enzymes: No results for input(s): CKTOTAL, CKMB, CKMBINDEX,  TROPONINI in the last 168 hours. BNP: Invalid input(s): POCBNP CBG: Recent Labs  Lab 04/13/18 0745 04/13/18 1137 04/13/18 1642 04/13/18 1955 04/13/18 2339  GLUCAP 96 113* 114* 99 105*   D-Dimer No results for input(s): DDIMER in the last 72 hours. Hgb A1c No results for input(s): HGBA1C in the last 72 hours. Lipid Profile No results for input(s): CHOL, HDL,  LDLCALC, TRIG, CHOLHDL, LDLDIRECT in the last 72 hours. Thyroid function studies No results for input(s): TSH, T4TOTAL, T3FREE, THYROIDAB in the last 72 hours.  Invalid input(s): FREET3 Anemia work up No results for input(s): VITAMINB12, FOLATE, FERRITIN, TIBC, IRON, RETICCTPCT in the last 72 hours. Urinalysis    Component Value Date/Time   COLORURINE YELLOW 04/02/2018 2101   APPEARANCEUR HAZY (A) 04/02/2018 2101   LABSPEC 1.023 04/02/2018 2101   PHURINE 5.0 04/02/2018 2101   GLUCOSEU NEGATIVE 04/02/2018 2101   HGBUR SMALL (A) 04/02/2018 2101   BILIRUBINUR NEGATIVE 04/02/2018 2101   BILIRUBINUR neg 04/21/2015 0956   KETONESUR 20 (A) 04/02/2018 2101   PROTEINUR 30 (A) 04/02/2018 2101   UROBILINOGEN 1.0 06/12/2015 1732   NITRITE POSITIVE (A) 04/02/2018 2101   LEUKOCYTESUR NEGATIVE 04/02/2018 2101   Sepsis Labs Invalid input(s): PROCALCITONIN,  WBC,  LACTICIDVEN Microbiology No results found for this or any previous visit (from the past 240 hour(s)).  Please note: You were cared for by a hospitalist during your hospital stay. Once you are discharged, your primary care physician will handle any further medical issues. Please note that NO REFILLS for any discharge medications will be authorized once you are discharged, as it is imperative that you return to your primary care physician (or establish a relationship with a primary care physician if you do not have one) for your post hospital discharge needs so that they can reassess your need for medications and monitor your lab values.    Time coordinating discharge:  40 minutes  SIGNED:   Burnadette Pop, MD  [facility] 04/14/2018, 9:44 AM Pager [phone]  If 7PM-7AM, please contact night-coverage [email or web address] Password [email or web address]

## 2018-04-12 ENCOUNTER — Other Ambulatory Visit: Payer: Self-pay | Admitting: *Deleted

## 2018-04-12 LAB — GLUCOSE, CAPILLARY
GLUCOSE-CAPILLARY: 90 mg/dL (ref 70–99)
GLUCOSE-CAPILLARY: 89 mg/dL (ref 70–99)
GLUCOSE-CAPILLARY: 88 mg/dL (ref 70–99)
GLUCOSE-CAPILLARY: 109 mg/dL — AB (ref 70–99)
Glucose-Capillary: 96 mg/dL (ref 70–99)
Glucose-Capillary: 90 mg/dL (ref 70–99)

## 2018-04-12 NOTE — Progress Notes (Addendum)
NCM called pt's brother [name] regarding pt's d/c plan. Pt  lives alone.... he come to provide assistance for sister or take her home with him which was discussed per previous NCM. Call unsuccessful, voice message left. NCM await return call. Gae GallopAngela Dniyah Grant RN,BSN,CM

## 2018-04-12 NOTE — Progress Notes (Addendum)
PROGRESS NOTE    MAN BONNEAU  ZOX:[id number] DOB: 11-Jan-1951 DOA: 04/02/2018 PCP: Jarome Matin, MD   Brief Narrative: Patient is a 67 year old female with PMH significant for Atrial fib on Eliquis, OSA, and neuropathy. Her recent medical course has been complicated by ongoing foot wounds/ulcers. She was admitted for this back in April of this year at what time she was found to have and abscess on the R foot and underwent surgical I&D. Cultures were negative. She was treated with an extended course of IV antibiotics, Then off and on oral antibiotics. She has been followed by Dr. Ninetta Lights in the ID clinic. Also followed by Dr. Victorino Dike with [facility] who felt nothing to do surgically in May. Most recently she was started on Doxycyline 6/21 at ortho clinic as she had continued discharge. ID felt she may need further MRI.  Then on 7/8 she was found down by home health aide. She was last known well Friday (7/5) afternoon. In the ED she was alert, but only intermittently answering yes/no questions. Sent for CT scan which was concerning for infarct vs PRES. Her BP was borderline high, but diastolic notably high. She was started on cleviprex infusion. She had MRI, which was more consistent with PRES. She was initially admitted to the intensive care unit. She was stabilized and then transferred to the floor. Patient was also evaluated by infectious disease for her bilateral chronic ulcers in the lower extremities and cardiology for A. fib with RVR during this hospitalization.  Antibiotics has been discontinued as her bilateral chronic lower extremity ulcers have improved.  The plan is to follow-up with EP as an outpatient regarding her A. fib with RVR.  She has been started on amiodarone and  resumed on Eliquis. She was also evaluated by orthopedics and neurology. Initially there was plan to discharge her to skilled nursing facility but it has been denied.  Currently the plan is to  discharge to home with home health.  Case manager is discussing with family member to ensure supervision/assistance at home.She is not a safe discharge unless there is somebody to supervise her at home.We will wait for the CM/SW follow up with family.    Acute metabolic encephalopathy Thought to be secondary to PRES. Seen by neurology. No seizure activity noted on continuous EEG. Mental status has significantly improved. Neurology has signed off. She is likely close to her baseline. Petechial hemorrhage in right occipital lobe noted on imaging studies. Anticoagulation was placed on hold. Cleared by neurology to resume anticoagulation. Patient seems to be stable. And she is alert and oriented.  Atrial fibrillation with RVR Patient with known history of atrial fibrillation. Followed by Dr. Ladona Ridgel. She was on carvedilol and Tikosyn previously. These medications were initially held. Anticoagulation had to be stopped due to presence of petechial hemorrhage on neuroimaging studies. Patient was continued on carvedilol. Dose had to be increased for poorly controlled heart rate. Seen by electrophysiology.They recommend keeping her off of the Tikosyn for now. Anticoagulation resumed after cleared by neurology. She was also started on amiodarone while she was in the ICU which is being continued. Heart rate at times noted to be poorly controlled. Low-dose Cardizem initiated as recommended by cardiology.  Outpatient follow-up with electrophysiology. EKG did show mildly prolonged QT interval.   Bilateral feet ulcers with metatarsal osteomyelitis Orthopedics has seen the patient. MRI of both the right and left foot were performed. No clear evidence of osteomyelitis was noted. Nonspecific changes were seen. Patient  remains on meropenem. Infectious disease is also following. Discussed with Dr. Lajoyce Corners on 7/15. No plans for surgery during this hospitalization. He will follow-up the patient  in his office in 2 weeks. Patient also reevaluated by infectious disease  and taken off of antibiotics due to improvement in the wounds.Continue wound care on discharge.  ESBL E. coli UTI Patient completed a course of meropenem. Foley catheter was discontinued.  Diarrhea Appears to have resolved. Most likely due to antibiotics. She had a rectal tube which was discontinued as well.   Demand ischemia Elevated troponin noted. Troponin peaked at 2.15. Thought to be due to demand ischemia. Patient denies any chest pain currently. Echocardiogram shows normal systolic function without any wall motion abnormalities. No further work-up at this time.  Acute renal failure/hypokalemia Resolved.  Obstructive sleep apnea Unclear if she was on CPAP.  Normocytic anemia Anemia panel reviewed. Ferritin 32. Folate 6.1. B12 371. Hemoglobin is stable. Continue with multivitamin.   Patient with history of depression and anxiety Noted to be on multiple psychotropic medications at home including benztropine, Wellbutrin, gabapentin, Trintellix and as needed Remeron for insomnia. Since she is recovering from being severely encephalopathic we will resume thesemedications gradually. Hold the gabapentin and the Remeron for now.    Assessment & Plan:   Active Problems:   Osteomyelitis of great toe of left foot (HCC)   PRES (posterior reversible encephalopathy syndrome)   Pressure injury of skin   Sepsis (HCC)   Atrial fibrillation with RVR (HCC)   Wound of left leg    DVT prophylaxis:Eliquis Code Status: Full Family Communication: None present at the bedside Disposition Plan: Discharge to home with home health after discussion with family members so that she can be fully supervised at home   Consultants: ID, orthopedics, cardiology, neurology  Antimicrobials:None  Subjective: Patient seen and examined the bedside this morning.  Remains comfortable.  No new  issues/events.  Denies any problems or complaints.  Objective: Vitals:   04/12/18 0006 04/12/18 0343 04/12/18 0815 04/12/18 1309  BP: 128/71 131/69 (!) 146/96 91/66  Pulse: (!) 59 (!) 55 (!) 52 (!) 47  Resp: 18 18 20 18   Temp: 98.1 F (36.7 C) 98 F (36.7 C) 97.9 F (36.6 C)   TempSrc: Oral Oral Oral   SpO2: 100% 100% 99% 97%  Weight:  108.3 kg (238 lb 12.1 oz)    Height:        Intake/Output Summary (Last 24 hours) at 04/12/2018 1325 Last data filed at 04/12/2018 0900 Gross per 24 hour  Intake 780 ml  Output -  Net 780 ml   Filed Weights   04/09/18 0343 04/11/18 0334 04/12/18 0343  Weight: 102.3 kg (225 lb 8.5 oz) 100.6 kg (221 lb 12.5 oz) 108.3 kg (238 lb 12.1 oz)    Examination:  General exam: Appears calm and comfortable ,Not in distress,average built HEENT:PERRL,Oral mucosa moist, Ear/Nose normal on gross exam Respiratory system: Bilateral equal air entry, normal vesicular breath sounds, no wheezes or crackles  Cardiovascular system: S1 & S2 heard, . No JVD, murmurs, rubs, gallops or clicks. No pedal edema. Gastrointestinal system: Abdomen is nondistended, soft and nontender. No organomegaly or masses felt. Normal bowel sounds heard. Central nervous system: Alert and oriented. No focal neurological deficits. Extremities: No edema, no clubbing ,no cyanosis, distal peripheral pulses palpable.  Ulcerations on bilateral feet.  Ulcers are clean and look noninfected.  Wrapped with dressings Skin: No rashes, lesions or ulcers,no icterus ,no pallor MSK: Normal muscle bulk,tone ,  power Psychiatry: Judgement and insight appear normal. Mood & affect appropriate.     Data Reviewed: I have personally reviewed following labs and imaging studies  CBC: Recent Labs  Lab 04/06/18 0237 04/08/18 0634 04/10/18 0519  WBC 8.7 7.9 7.8  HGB 10.4* 10.0* 10.7*  HCT 35.1* 33.7* 35.9*  MCV 84.0 84.0 84.5  PLT 324 345 384   Basic Metabolic Panel: Recent Labs  Lab 04/06/18 0237  04/06/18 1838 04/08/18 0634 04/10/18 0519  NA 137 135 135 137  K 3.1* 3.5 3.4* 4.1  CL 100 102 103 103  CO2 27 24 25 27   GLUCOSE 111* 121* 106* 106*  BUN 24* 20 19 17   CREATININE 1.04* 1.07* 0.82 0.85  CALCIUM 8.2* 8.3* 8.3* 8.6*  MG 2.0  --   --   --   PHOS 3.1  --   --   --    GFR: Estimated Creatinine Clearance: 79.7 mL/min (by C-G formula based on SCr of 0.85 mg/dL). Liver Function Tests: No results for input(s): AST, ALT, ALKPHOS, BILITOT, PROT, ALBUMIN in the last 168 hours. No results for input(s): LIPASE, AMYLASE in the last 168 hours. No results for input(s): AMMONIA in the last 168 hours. Coagulation Profile: No results for input(s): INR, PROTIME in the last 168 hours. Cardiac Enzymes: No results for input(s): CKTOTAL, CKMB, CKMBINDEX, TROPONINI in the last 168 hours. BNP (last 3 results) No results for input(s): PROBNP in the last 8760 hours. HbA1C: No results for input(s): HGBA1C in the last 72 hours. CBG: Recent Labs  Lab 04/11/18 1959 04/11/18 2334 04/12/18 0307 04/12/18 0722 04/12/18 1156  GLUCAP 101* 87 90 89 88   Lipid Profile: No results for input(s): CHOL, HDL, LDLCALC, TRIG, CHOLHDL, LDLDIRECT in the last 72 hours. Thyroid Function Tests: No results for input(s): TSH, T4TOTAL, FREET4, T3FREE, THYROIDAB in the last 72 hours. Anemia Panel: No results for input(s): VITAMINB12, FOLATE, FERRITIN, TIBC, IRON, RETICCTPCT in the last 72 hours. Sepsis Labs: No results for input(s): PROCALCITON, LATICACIDVEN in the last 168 hours.  Recent Results (from the past 240 hour(s))  Blood Culture (routine x 2)     Status: None   Collection Time: 04/02/18  4:17 PM  Result Value Ref Range Status   Specimen Description BLOOD RIGHT ANTECUBITAL  Final   Special Requests   Final    BOTTLES DRAWN AEROBIC ONLY Blood Culture results may not be optimal due to an inadequate volume of blood received in culture bottles   Culture   Final    NO GROWTH 5 DAYS Performed at  [facility], [address] [place], [place] [address]    Report Status 04/07/2018 FINAL  Final  Culture, blood (Routine X 2) w Reflex to ID Panel     Status: None   Collection Time: 04/02/18  8:21 PM  Result Value Ref Range Status   Specimen Description BLOOD LEFT HAND  Final   Special Requests   Final    BOTTLES DRAWN AEROBIC AND ANAEROBIC Blood Culture results may not be optimal due to an inadequate volume of blood received in culture bottles   Culture   Final    NO GROWTH 5 DAYS Performed at [facility], [address] [place], [place] [address]    Report Status 04/07/2018 FINAL  Final  Urine culture     Status: Abnormal   Collection Time: 04/02/18  8:57 PM  Result Value Ref Range Status   Specimen Description URINE, CATHETERIZED  Final  Special Requests NONE  Final   Culture (A)  Final    >=100,000 COLONIES/mL ESCHERICHIA COLI Confirmed Extended Spectrum Beta-Lactamase Producer (ESBL).  In bloodstream infections from ESBL organisms, carbapenems are preferred over piperacillin/tazobactam. They are shown to have a lower risk of mortality. Performed at [facility], [address] [place], [place] [address]    Report Status 04/05/2018 FINAL  Final   Organism ID, Bacteria ESCHERICHIA COLI (A)  Final      Susceptibility   Escherichia coli - MIC*    AMPICILLIN >=32 RESISTANT Resistant     CEFAZOLIN >=64 RESISTANT Resistant     CEFTRIAXONE >=64 RESISTANT Resistant     CIPROFLOXACIN >=4 RESISTANT Resistant     GENTAMICIN >=16 RESISTANT Resistant     IMIPENEM <=0.25 SENSITIVE Sensitive     NITROFURANTOIN <=16 SENSITIVE Sensitive     TRIMETH/SULFA <=20 SENSITIVE Sensitive     AMPICILLIN/SULBACTAM >=32 RESISTANT Resistant     PIP/TAZO <=4 SENSITIVE Sensitive     Extended ESBL POSITIVE Resistant     * >=100,000 COLONIES/mL ESCHERICHIA COLI  MRSA PCR Screening     Status: None   Collection Time: 04/02/18 11:02 PM  Result Value Ref Range Status   MRSA by PCR  NEGATIVE NEGATIVE Final    Comment:        The GeneXpert MRSA Assay (FDA approved for NASAL specimens only), is one component of a comprehensive MRSA colonization surveillance program. It is not intended to diagnose MRSA infection nor to guide or monitor treatment for MRSA infections. Performed at [facility], [address] [place], [place] [address]          Radiology Studies: No results found.      Scheduled Meds: . amiodarone  200 mg Oral BID  . apixaban  5 mg Oral BID  . benztropine  1.5 mg Oral Daily  . buPROPion  150 mg Oral BID  . carvedilol  25 mg Oral BID WC  . chlorhexidine  15 mL Mouth Rinse BID  . diltiazem  30 mg Oral Q12H  . feeding supplement (PRO-STAT SUGAR FREE 64)  30 mL Oral TID  . mouth rinse  15 mL Mouth Rinse q12n4p  . multivitamins with iron  1 tablet Oral Daily  . saccharomyces boulardii  250 mg Oral BID  . vortioxetine HBr  5 mg Oral Daily   Continuous Infusions: . sodium chloride 20 mL/hr at 04/05/18 1948     LOS: 10 days    Time spent: 25 mins.      Burnadette Pop, MD [facility] Pager [phone]  If 7PM-7AM, please contact night-coverage [email or web address] Password [email or web address] 04/12/2018, 1:25 PM

## 2018-04-12 NOTE — Progress Notes (Addendum)
NCM sopke with brother,Steve concerning pt's d/c plan. [name] stated he and sister [name] are unable to provide resources to help facilitate d/c to home. [name] states sister [name] lives in [place] and had TKR today. [name] states he will be appealing the insurance denial for SNF. [name], also stated he has contacted a lawyer regarding insurance denial to SNF. NCM spoke with Medical Advisor updating him on matter which CSW had discussed in LOS meeting today. Medical advisor suggested CSW  find out if pt is Medicaid eligible and if so pt could potentially be LOG'd for SNF. NCM made CSW aware. Gae GallopAngela Arcadia Gorgas RN,BSN,CM

## 2018-04-12 NOTE — Patient Outreach (Signed)
[facility] Care Management  04/12/2018  Yvette ShutterCarolyn I Orozco 01-13-1951 [id number]   CSW received a new referral on patient from [facility], [facility] with [facility] Management, requesting that CSW assist patient with completion of a Long-Term Care Medicaid application through the [facility] [place] [facility] of [facility], as well as assist patient with long-term care planning services.  [name] indicated that she has already made a referral for patient to [facility], which will provide aide services for patient in the home, once patient is discharged from the hospital and back into the community.  CSW noted that patient remains hospitalized; therefore, CSW will make an outreach attempt to patient once she returns home.  CSW will also continue to follow patient while hospitalized. Danford BadJoanna Saporito, BSW, MSW, LCSW  Licensed Restaurant manager, fast foodClinical Social Worker  [facility]  Mailing [place], [place], [place] [address] Physical Address-[address], [place], [place] [address] Toll Free Main # [phone] Fax # [phone] Cell # [phone]  Office # [phone] Mardene [email or web address]

## 2018-04-12 NOTE — Progress Notes (Signed)
Physical Therapy Treatment Patient Details Name: [name] MRN: [id number] DOB: 1950/10/09 Today's Date: 04/12/2018    History of Present Illness 67 y.o. female found on ground by home health aide, admitted MRI consistent with PRES. PMH includes: A fib, OSA, neuropathy, ongoing foot wound/ulcers., Obesity, HTN, urinary incontinence, B TKA.     PT Comments    [name] received in bed - agreeable to work with PT. PT session focusing on improving safe functional mobility. Able to progress transfers with use of Stedy. Does continue to require Max A +2 for bed mobility and sit to stand in order to use transfer equipment, but with patient able to assist by pulling up. Seemingly fearful of falling throughout session, even with static sitting requiring consistent reassurance of her safety. Making progress towards goals. PT to continue to follow acutely.     Follow Up Recommendations  SNF;Supervision/Assistance - 24 hour     Equipment Recommendations  Other (comment)(TBD)    Recommendations for Other Services OT consult     Precautions / Restrictions Precautions Precautions: Fall Restrictions Weight Bearing Restrictions: No    Mobility  Bed Mobility Overal bed mobility: Needs Assistance Bed Mobility: Rolling;Supine to Sit Rolling: Mod assist   Supine to sit: Max assist     General bed mobility comments: Mod-Max A for bed mobility. Good initiation by patient but requires physical assist to complete successfully  Transfers Overall transfer level: Needs assistance   Transfers: Sit to/from Stand Sit to Stand: Max assist;+2 physical assistance         General transfer comment: Max A +2 for sit to stand for Stedy placement; able to assist wiht pulling up; seemingly very fearful of falling requiring reassurance of her safety.  Ambulation/Gait                 Stairs             Wheelchair Mobility    Modified Rankin (Stroke Patients Only)        Balance Overall balance assessment: Needs assistance Sitting-balance support: Bilateral upper extremity supported;Feet supported Sitting balance-Leahy Scale: Fair     Standing balance support: Bilateral upper extremity supported Standing balance-Leahy Scale: Zero                              Cognition Arousal/Alertness: Awake/alert Behavior During Therapy: Flat affect Overall Cognitive Status: Impaired/Different from baseline Area of Impairment: Attention;Following commands;Safety/judgement;Problem solving                   Current Attention Level: Sustained   Following Commands: Follows one step commands inconsistently;Follows one step commands with increased time Safety/Judgement: Decreased awareness of safety;Decreased awareness of deficits   Problem Solving: Slow processing;Decreased initiation;Difficulty sequencing;Requires verbal cues;Requires tactile cues General Comments: consistent cueing throughout for safety, sequencing, hand placement      Exercises      General Comments        Pertinent Vitals/Pain Pain Assessment: No/denies pain    Home Living                      Prior Function            PT Goals (current goals can now be found in the care plan section) Acute Rehab PT Goals Patient Stated Goal: Pt unable to participate  PT Goal Formulation: With patient Time For Goal Achievement: 04/14/18 Potential to Achieve Goals: Fair Progress towards  PT goals: Progressing toward goals    Frequency    Min 3X/week      PT Plan Current plan remains appropriate    Co-evaluation              AM-PAC PT "6 Clicks" Daily Activity  Outcome Measure  Difficulty turning over in bed (including adjusting bedclothes, sheets and blankets)?: Unable Difficulty moving from lying on back to sitting on the side of the bed? : Unable Difficulty sitting down on and standing up from a chair with arms (e.g., wheelchair, bedside commode,  etc,.)?: Unable Help needed moving to and from a bed to chair (including a wheelchair)?: Total Help needed walking in hospital room?: Total Help needed climbing 3-5 steps with a railing? : Total 6 Click Score: 6    End of Session Equipment Utilized During Treatment: Gait belt Activity Tolerance: Patient tolerated treatment well Patient left: in chair;with call bell/phone within reach;with chair alarm set Nurse Communication: Mobility status PT Visit Diagnosis: Unsteadiness on feet (R26.81);Muscle weakness (generalized) (M62.81)     Time: 4098-1191 PT Time Calculation (min) (ACUTE ONLY): 24 min  Charges:  $Therapeutic Activity: 23-37 mins                    G Codes:       Kipp Laurence, PT, DPT 04/12/18 1:39 PM Pager: [phone]

## 2018-04-12 NOTE — Social Work (Addendum)
CSW has spoken with pt brother [name] x3 this morning. He is aware that pt has been denied by insurance for SNF stay. Pt will have to return home with home health unless they can private pay for SNF stay. Currently there is concern due to pt's functional status that she is not safe at home without more in home support. Pt brother states he will try and get something arranged in home, and have a neighbor or friend meet the pt at her home. CSW unable to arrange LTC placement without funding (Medicaid or private pay) at this time.   RN Case Management aware.   3:42pm- After consulting Medical Director [name], MD, CSW aware that dept may be able to approve a 30 day LOG to provide safe discharge plan for pt while pt family applies for Medicaid. CSW spoke with pt brother [name] again regarding discharge planning and continuing the application for Medicaid. Pt brother states that they continue to be unable to find someone to stay at home with pt, and they are unable to provide supervision and assistance at this time. They do not feel she would have appropriate support with just home health. CSW explained that they have the right to appeal with [employer], but that would not cover long term care placement. Pt brother states that he has a Clinical research associatelawyer coming to help with pt's affairs and care arrangements. When asked if lawyer could contact CSW, CSW referred pt brother for lawyer to call risk management with any questions. CSW offered 30 day LOG and explained process for finding facility that accepts LOG in order for pt to have 24/7 supervision and nursing care. Pt brother aware that there may be limited choices that accept LOG. CSW will f/u with facilities regarding LOG placement.   Continuing to follow. Denial letter from [employer] was left at bedside for pt, pt brother aware. Pt informed of what is going on and that we are currently while working to support discharge that is safe.   [name], LCSWA [facility] Clinical Social Work [phone]

## 2018-04-13 LAB — GLUCOSE, CAPILLARY
GLUCOSE-CAPILLARY: 87 mg/dL (ref 70–99)
GLUCOSE-CAPILLARY: 96 mg/dL (ref 70–99)
GLUCOSE-CAPILLARY: 113 mg/dL — AB (ref 70–99)
Glucose-Capillary: 105 mg/dL — ABNORMAL HIGH (ref 70–99)
Glucose-Capillary: 114 mg/dL — ABNORMAL HIGH (ref 70–99)
Glucose-Capillary: 99 mg/dL (ref 70–99)

## 2018-04-13 NOTE — Social Work (Addendum)
CSW spoke with financial counseling in regards to pt disposition needs and need for pt to begin Medicaid process. They will reach out to pt brother [name], CSW still searching for 30 day LOG placement.   2:28pm- CSW spoke with financial counselor, initial screen for Medicaid complete, pt brother states he is coming down to [name] to help with process. CSW continuing to search for placement for pt.   3:32pm- [facility] able to offer placement to pt under 30 day LOG (30 days skilled; 14 days of therapy). Pt brother will fill out paperwork with admissions liaison [name]. Pt able to discharge likely tomorrow with LOG.   5:26pm- Admissions liaison confirmed paperwork completed for SNF placement, weekend [facility]  Continuing to follow.   Doy HutchingIsabel H Paisly Orozco, LCSWA [facility] Clinical Social Work [phone]

## 2018-04-13 NOTE — Care Management Important Message (Signed)
Important Message  Patient Details  Name: [name] MRN: [id number] Date of Birth: 01-18-1951   Medicare Important Message Given:  Yes  Given to  [name], [name] [phone]  [phone]       [name], RN 04/13/2018, 4:40 PM

## 2018-04-13 NOTE — Consult Note (Signed)
   [facility]   04/13/2018  Yvette ShutterCarolyn I Orozco Sep 10, 1951 [id number]    [facility] Liaison follow up.  Chart reviewed. Reviewed inpatient LCSW notes. [facility] to make aware that patient remains hospitalized.  Will update [facility] team.   [name], MSN-Ed, RN,BSN [facility] Care Management [facility] Liaison [phone]

## 2018-04-13 NOTE — Progress Notes (Signed)
PROGRESS NOTE    [name]  ZOX:[id number] DOB: 1951-03-04 DOA: 04/02/2018 PCP: Jarome Matin, MD   Brief Narrative: Patient is a 67 year old female with PMH significant for Atrial fib on Eliquis, OSA, and neuropathy. Her recent medical course has been complicated by ongoing foot wounds/ulcers. She was admitted for this back in April of this year at what time she was found to have and abscess on the R foot and underwent surgical I&D. Cultures were negative. She was treated with an extended course of IV antibiotics, Then off and on oral antibiotics. She has been followed by Dr. Ninetta Lights in the ID clinic. Also followed by Dr. Victorino Dike with [facility] who felt nothing to do surgically in May. Most recently she was started on Doxycyline 6/21 at ortho clinic as she had continued discharge. ID felt she may need further MRI.  Then on 7/8 she was found down by home health aide. She was last known well Friday (7/5) afternoon. In the ED she was alert, but only intermittently answering yes/no questions. Sent for CT scan which was concerning for infarct vs PRES. Her BP was borderline high, but diastolic notably high. She was started on cleviprex infusion. She had MRI, which was more consistent with PRES. She was initially admitted to the intensive care unit. She was stabilized and then transferred to the floor. Patient was also evaluated by infectious disease for her bilateral chronic ulcers in the lower extremities and cardiology for A. fib with RVR during this hospitalization.  Antibiotics has been discontinued as her bilateral chronic lower extremity ulcers have improved.  The plan is to follow-up with EP as an outpatient regarding her A. fib with RVR.  She has been started on amiodarone and  resumed on Eliquis. She was also evaluated by orthopedics and neurology. Current  plan is to discharge her to skilled nursing facility .Processing on LOG.CM/SW following. Plan is to discharge to  skilled nursing facility as soon as the bed is available.   Acute metabolic encephalopathy Thought to be secondary to PRES. Seen by neurology. No seizure activity noted on continuous EEG. Mental status has significantly improved. Neurology has signed off. She is likely close to her baseline. Petechial hemorrhage in right occipital lobe noted on imaging studies. Anticoagulation was placed on hold. Cleared by neurology to resume anticoagulation. Patient seems to be stable. And she is alert and oriented.  Atrial fibrillation with RVR Patient with known history of atrial fibrillation. Followed by Dr. Ladona Ridgel. She was on carvedilol and Tikosyn previously. These medications were initially held. Anticoagulation had to be stopped due to presence of petechial hemorrhage on neuroimaging studies. Patient was continued on carvedilol. Dose had to be increased for poorly controlled heart rate. Seen by electrophysiology.They recommend keeping her off of the Tikosyn for now. Anticoagulation resumed after cleared by neurology. She was also started on amiodarone while she was in the ICU which is being continued. Heart rate at times noted to be poorly controlled. Low-dose Cardizem initiated as recommended by cardiology.  Outpatient follow-up with electrophysiology. EKG did show mildly prolonged QT interval.   Bilateral feet ulcers with metatarsal osteomyelitis Orthopedics has seen the patient. MRI of both the right and left foot were performed. No clear evidence of osteomyelitis was noted. Nonspecific changes were seen. Patient remains on meropenem. Infectious disease is also following. Discussed with Dr. Lajoyce Corners on 7/15. No plans for surgery during this hospitalization. He will follow-up the patient in his office in 2 weeks. Patient also reevaluated  by infectious disease  and taken off of antibiotics due to improvement in the wounds.Continue wound care on discharge.  ESBL E. coli  UTI Patient completed a course of meropenem. Foley catheter was discontinued.  Diarrhea Appears to have resolved. Most likely due to antibiotics. She had a rectal tube which was discontinued as well.   Demand ischemia Elevated troponin noted. Troponin peaked at 2.15. Thought to be due to demand ischemia. Patient denies any chest pain currently. Echocardiogram shows normal systolic function without any wall motion abnormalities. No further work-up at this time.  Acute renal failure/hypokalemia Resolved.  Obstructive sleep apnea Unclear if she was on CPAP.  Normocytic anemia Anemia panel reviewed. Ferritin 32. Folate 6.1. B12 371. Hemoglobin is stable. Continue with multivitamin.   Patient with history of depression and anxiety Noted to be on multiple psychotropic medications at home including benztropine, Wellbutrin, gabapentin, Trintellix and as needed Remeron for insomnia. Since she is recovering from being severely encephalopathic we will resume thesemedications gradually. Hold the gabapentin and the Remeron for now.    Assessment & Plan:   Active Problems:   Osteomyelitis of great toe of left foot (HCC)   PRES (posterior reversible encephalopathy syndrome)   Pressure injury of skin   Sepsis (HCC)   Atrial fibrillation with RVR (HCC)   Wound of left leg    DVT prophylaxis:Eliquis Code Status: Full Family Communication: None present at the bedside Disposition Plan: Discharge to SNF as soon as it is feasible   Consultants: ID, orthopedics, cardiology, neurology  Antimicrobials:None  Subjective: Patient seen and examined the bedside this morning.  Remains comfortable.  No new issues/events.  Denies any problems or complaints.  Objective: Vitals:   04/12/18 1944 04/12/18 2327 04/13/18 0413 04/13/18 0746  BP: 129/76 139/72 130/81 (!) 156/77  Pulse: (!) 55 (!) 59 62 (!) 52  Resp: 18 18 16 20   Temp: 98.3 F (36.8 C) 98 F (36.7 C) 98.5 F  (36.9 C)   TempSrc: Oral Oral Oral   SpO2: 100% 100% 100%   Weight:   108.3 kg (238 lb 12.1 oz)   Height:        Intake/Output Summary (Last 24 hours) at 04/13/2018 1427 Last data filed at 04/13/2018 0600 Gross per 24 hour  Intake 120 ml  Output 2550 ml  Net -2430 ml   Filed Weights   04/11/18 0334 04/12/18 0343 04/13/18 0413  Weight: 100.6 kg (221 lb 12.5 oz) 108.3 kg (238 lb 12.1 oz) 108.3 kg (238 lb 12.1 oz)    Examination:  General exam: Appears calm and comfortable ,Not in distress,average built HEENT:PERRL,Oral mucosa moist, Ear/Nose normal on gross exam Respiratory system: Bilateral equal air entry, normal vesicular breath sounds, no wheezes or crackles  Cardiovascular system: S1 & S2 heard, . No JVD, murmurs, rubs, gallops or clicks. No pedal edema. Gastrointestinal system: Abdomen is nondistended, soft and nontender. No organomegaly or masses felt. Normal bowel sounds heard. Central nervous system: Alert and awake Extremities: No edema, no clubbing ,no cyanosis, distal peripheral pulses palpable.  Ulcerations on bilateral feet.  Ulcers are clean and look noninfected.  Wrapped with dressings Skin: No rashes, lesions ,no icterus ,no pallor    Data Reviewed: I have personally reviewed following labs and imaging studies  CBC: Recent Labs  Lab 04/08/18 0634 04/10/18 0519  WBC 7.9 7.8  HGB 10.0* 10.7*  HCT 33.7* 35.9*  MCV 84.0 84.5  PLT 345 384   Basic Metabolic Panel: Recent Labs  Lab 04/06/18 1838  04/08/18 0634 04/10/18 0519  NA 135 135 137  K 3.5 3.4* 4.1  CL 102 103 103  CO2 24 25 27   GLUCOSE 121* 106* 106*  BUN 20 19 17   CREATININE 1.07* 0.82 0.85  CALCIUM 8.3* 8.3* 8.6*   GFR: Estimated Creatinine Clearance: 79.7 mL/min (by C-G formula based on SCr of 0.85 mg/dL). Liver Function Tests: No results for input(s): AST, ALT, ALKPHOS, BILITOT, PROT, ALBUMIN in the last 168 hours. No results for input(s): LIPASE, AMYLASE in the last 168 hours. No  results for input(s): AMMONIA in the last 168 hours. Coagulation Profile: No results for input(s): INR, PROTIME in the last 168 hours. Cardiac Enzymes: No results for input(s): CKTOTAL, CKMB, CKMBINDEX, TROPONINI in the last 168 hours. BNP (last 3 results) No results for input(s): PROBNP in the last 8760 hours. HbA1C: No results for input(s): HGBA1C in the last 72 hours. CBG: Recent Labs  Lab 04/12/18 1930 04/12/18 2326 04/13/18 0342 04/13/18 0745 04/13/18 1137  GLUCAP 96 90 87 96 113*   Lipid Profile: No results for input(s): CHOL, HDL, LDLCALC, TRIG, CHOLHDL, LDLDIRECT in the last 72 hours. Thyroid Function Tests: No results for input(s): TSH, T4TOTAL, FREET4, T3FREE, THYROIDAB in the last 72 hours. Anemia Panel: No results for input(s): VITAMINB12, FOLATE, FERRITIN, TIBC, IRON, RETICCTPCT in the last 72 hours. Sepsis Labs: No results for input(s): PROCALCITON, LATICACIDVEN in the last 168 hours.  No results found for this or any previous visit (from the past 240 hour(s)).       Radiology Studies: No results found.      Scheduled Meds: . amiodarone  200 mg Oral BID  . apixaban  5 mg Oral BID  . benztropine  1.5 mg Oral Daily  . buPROPion  150 mg Oral BID  . carvedilol  25 mg Oral BID WC  . chlorhexidine  15 mL Mouth Rinse BID  . diltiazem  30 mg Oral Q12H  . feeding supplement (PRO-STAT SUGAR FREE 64)  30 mL Oral TID  . mouth rinse  15 mL Mouth Rinse q12n4p  . multivitamins with iron  1 tablet Oral Daily  . saccharomyces boulardii  250 mg Oral BID  . vortioxetine HBr  5 mg Oral Daily   Continuous Infusions: . sodium chloride 20 mL/hr at 04/05/18 1948     LOS: 11 days    Time spent: 25 mins.      Burnadette Pop, MD [facility] Pager [phone]  If 7PM-7AM, please contact night-coverage [email or web address] Password [facility] 04/13/2018, 2:27 PM

## 2018-04-13 NOTE — Evaluation (Signed)
Occupational Therapy Evaluation Patient Details Name: [name] MRN: [id number] DOB: 1951/03/04 Today's Date: 04/13/2018    History of Present Illness 67 y.o. female found on ground by home health aide, admitted MRI consistent with PRES. PMH includes: A fib, OSA, neuropathy, ongoing foot wound/ulcers., Obesity, HTN, urinary incontinence, B TKA.    Clinical Impression   Pt seen directly after PT session, so focused on attention to grooming tasks in recliner. Pt continues to require mod A with multimodal cues for task completion. OT will continue to follow acutely and SNF postacute remains essential for safety and to maximize independence in ADL and functional transfers.     Follow Up Recommendations  SNF    Equipment Recommendations  Other (comment)(defer to next venue)    Recommendations for Other Services       Precautions / Restrictions Precautions Precautions: Fall Restrictions Weight Bearing Restrictions: No      Mobility Bed Mobility               General bed mobility comments: Pt sitting EOB with PT when OT entered  Transfers Overall transfer level: Needs assistance   Transfers: Sit to/from Stand Sit to Stand: Max assist;+2 physical assistance         General transfer comment: max A +2 to stand using stedy. Pt unable to rise fully, but able to clear buttocks enough to get stedy seat positioned. Pt required multimodal cues for hand placement during transfer.    Balance Overall balance assessment: Needs assistance Sitting-balance support: Bilateral upper extremity supported;Feet supported Sitting balance-Leahy Scale: Poor     Standing balance support: Bilateral upper extremity supported Standing balance-Leahy Scale: Zero                             ADL either performed or assessed with clinical judgement   ADL Overall ADL's : Needs assistance/impaired     Grooming: Wash/dry hands;Wash/dry face;Moderate assistance;Cueing for  sequencing;Sitting Grooming Details (indicate cue type and reason): Pt unable to initiate grooming tasks today, mod A required as well as for attention to grooming task                               General ADL Comments: Pt required multimodal cues and re-direction in addition to physical assist     Vision         Perception     Praxis      Pertinent Vitals/Pain Pain Assessment: No/denies pain     Hand Dominance     Extremity/Trunk Assessment Upper Extremity Assessment Upper Extremity Assessment: Generalized weakness   Lower Extremity Assessment Lower Extremity Assessment: Defer to PT evaluation       Communication     Cognition Arousal/Alertness: Awake/alert Behavior During Therapy: Flat affect Overall Cognitive Status: Impaired/Different from baseline Area of Impairment: Attention;Following commands;Safety/judgement;Problem solving                   Current Attention Level: Sustained   Following Commands: Follows one step commands inconsistently;Follows one step commands with increased time Safety/Judgement: Decreased awareness of safety;Decreased awareness of deficits   Problem Solving: Slow processing;Decreased initiation;Difficulty sequencing;Requires verbal cues;Requires tactile cues General Comments: pt very easily distracted by other sounds or movement. Required frequent redirection to stay on task. Multimodal cues often needed for pt to follow commands.   General Comments       Exercises  Shoulder Instructions      Home Living                                          Prior Functioning/Environment                   OT Problem List:        OT Treatment/Interventions:      OT Goals(Current goals can be found in the care plan section)    OT Frequency: Min 2X/week   Barriers to D/C:            Co-evaluation              AM-PAC PT "6 Clicks" Daily Activity     Outcome Measure Help from  another person eating meals?: Total Help from another person taking care of personal grooming?: A Lot Help from another person toileting, which includes using toliet, bedpan, or urinal?: Total Help from another person bathing (including washing, rinsing, drying)?: Total Help from another person to put on and taking off regular upper body clothing?: A Lot Help from another person to put on and taking off regular lower body clothing?: Total 6 Click Score: 8   End of Session Equipment Utilized During Treatment: Other (comment)(Stedy) Nurse Communication: Mobility status  Activity Tolerance: Patient tolerated treatment well Patient left: with call bell/phone within reach;in chair;with chair alarm set  OT Visit Diagnosis: Unsteadiness on feet (R26.81);Muscle weakness (generalized) (M62.81);Other symptoms and signs involving cognitive function                Time: 1203-1220 OT Time Calculation (min): 17 min Charges:  OT General Charges $OT Visit: 1 Visit OT Treatments $Self Care/Home Management : 8-22 mins G-Codes:    Sherryl MangesLaura Charlcie Prisco OTR/L [phone]  Yvette BioLaura J Rayli Orozco 04/13/2018, 6:59 PM

## 2018-04-13 NOTE — Progress Notes (Signed)
Physical Therapy Treatment Patient Details Name: [name] MRN: [id number] DOB: 26-Jan-1951 Today's Date: 04/13/2018    History of Present Illness 67 y.o. female found on ground by home health aide, admitted MRI consistent with PRES. PMH includes: A fib, OSA, neuropathy, ongoing foot wound/ulcers., Obesity, HTN, urinary incontinence, B TKA.     PT Comments    Pt continues to be limited by decreased strength, cognition, and safety awareness. She required max A+2 for all mobilities and was able to transfer to chair with use of stedy. Several times during session pt would suddenly reach out for support as if she was falling and reported feeling as if the room was spinning. Pt may benefit from vestibular eval. Continue to feel SNF level therapies remain appropriate as pt lives alone and requires significant assist for safety with all mobility. Will continue to follow acutely.    Follow Up Recommendations  SNF;Supervision/Assistance - 24 hour     Equipment Recommendations  Other (comment)(TBD)    Recommendations for Other Services OT consult     Precautions / Restrictions Precautions Precautions: Fall Restrictions Weight Bearing Restrictions: No    Mobility  Bed Mobility Overal bed mobility: Needs Assistance Bed Mobility: Supine to Sit     Supine to sit: Max assist;+2 for physical assistance     General bed mobility comments: pt able to initiate movement but unable to complete task w/o max A +2 and cues.  Transfers Overall transfer level: Needs assistance   Transfers: Sit to/from Stand Sit to Stand: Max assist;+2 physical assistance         General transfer comment: max A +2 to stand using stedy. Pt unable to rise fully, but able to clear buttocks enough to get stedy seat positioned. Pt required multimodal cues for hand placement during transfer.  Ambulation/Gait             General Gait Details: unable at this time   Stairs              Wheelchair Mobility    Modified Rankin (Stroke Patients Only)       Balance Overall balance assessment: Needs assistance Sitting-balance support: Bilateral upper extremity supported;Feet supported Sitting balance-Leahy Scale: Fair     Standing balance support: Bilateral upper extremity supported Standing balance-Leahy Scale: Zero                              Cognition Arousal/Alertness: Awake/alert Behavior During Therapy: Flat affect Overall Cognitive Status: Impaired/Different from baseline Area of Impairment: Attention;Following commands;Safety/judgement;Problem solving                   Current Attention Level: Sustained   Following Commands: Follows one step commands inconsistently;Follows one step commands with increased time Safety/Judgement: Decreased awareness of safety;Decreased awareness of deficits   Problem Solving: Slow processing;Decreased initiation;Difficulty sequencing;Requires verbal cues;Requires tactile cues General Comments: pt very easily distracted by other sounds or movement. Required frequent redirection to stay on task. Multimodal cues often needed for pt to follow commands.      Exercises      General Comments        Pertinent Vitals/Pain Pain Assessment: No/denies pain    Home Living                      Prior Function            PT Goals (current goals can now be found  in the care plan section) Acute Rehab PT Goals PT Goal Formulation: With patient Time For Goal Achievement: 04/14/18 Potential to Achieve Goals: Fair Progress towards PT goals: Progressing toward goals    Frequency    Min 3X/week      PT Plan Current plan remains appropriate    Co-evaluation              AM-PAC PT "6 Clicks" Daily Activity  Outcome Measure  Difficulty turning over in bed (including adjusting bedclothes, sheets and blankets)?: Unable Difficulty moving from lying on back to sitting on the side of  the bed? : Unable Difficulty sitting down on and standing up from a chair with arms (e.g., wheelchair, bedside commode, etc,.)?: Unable Help needed moving to and from a bed to chair (including a wheelchair)?: Total Help needed walking in hospital room?: Total Help needed climbing 3-5 steps with a railing? : Total 6 Click Score: 6    End of Session Equipment Utilized During Treatment: Gait belt Activity Tolerance: Patient tolerated treatment well Patient left: in chair;with call bell/phone within reach;with chair alarm set Nurse Communication: Mobility status PT Visit Diagnosis: Unsteadiness on feet (R26.81);Muscle weakness (generalized) (M62.81)     Time: 6962-95281139-1206 PT Time Calculation (min) (ACUTE ONLY): 27 min  Charges:  $Therapeutic Activity: 23-37 mins                    G Codes:       Kallie LocksHannah Jermisha Hoffart, [place]A Pager [phone] Acute Rehab   Sheral ApleyHannah E Kaari Zeigler 04/13/2018, 1:05 PM

## 2018-04-14 MED ORDER — AMIODARONE HCL 200 MG PO TABS: 200.0000 mg | ORAL_TABLET | Freq: Two times a day (BID) | ORAL | 0 refills | Status: DC

## 2018-04-14 MED ORDER — DILTIAZEM HCL 30 MG PO TABS: 30.0000 mg | ORAL_TABLET | Freq: Two times a day (BID) | ORAL | 0 refills | Status: DC

## 2018-04-14 MED ORDER — CARVEDILOL 25 MG PO TABS: 25.0000 mg | ORAL_TABLET | Freq: Two times a day (BID) | ORAL | 0 refills | Status: AC

## 2018-04-14 NOTE — Clinical Social Work Note (Signed)
If pt will d/c today, snf must have summary before 2:00 PM.   Velora MediateBridget Shainna Faux, MSW [phone]

## 2018-04-14 NOTE — Progress Notes (Signed)
[facility] . Left VM message for [place].  Barbera Settersurner, Kellsie Grindle B RN

## 2018-04-14 NOTE — Care Management Note (Signed)
Case Management Note  Patient Details  Name: [name] MRN: [id number] Date of Birth: 10/27/1950  Subjective/Objective:             DC to SNF as facilitated by CSW.        Action/Plan:   Expected Discharge Date:  04/14/18               Expected Discharge Plan:  Skilled Nursing Facility  In-House Referral:  Clinical Social Work  Discharge planning Services  CM Consult  Post Acute Care Choice:    Choice offered to:     DME Arranged:    DME Agency:     HH Arranged:    HH Agency:     Status of Service:  Completed, signed off  If discussed at [employer] Meetings, dates discussed:    Additional Comments:  Lawerance SabalDebbie Remonia Otte, RN 04/14/2018, 10:33 AM

## 2018-04-14 NOTE — Clinical Social Work Note (Signed)
Clinical Social Worker facilitated patient discharge including contacting patient family and facility to confirm patient discharge plans.  Clinical information faxed to facility and family agreeable with plan.  CSW [facility].  RN to call [phone] for report prior to discharge.  Clinical Social Worker will sign off for now as social work intervention is no longer needed. Please consult us again if new need arises.  Velora MediateBridget Analya Louissaint, MSW [phone]

## 2018-04-16 ENCOUNTER — Other Ambulatory Visit: Payer: Self-pay | Admitting: *Deleted

## 2018-04-16 ENCOUNTER — Encounter: Payer: Self-pay | Admitting: Adult Health

## 2018-04-16 ENCOUNTER — Non-Acute Institutional Stay (SKILLED_NURSING_FACILITY): Payer: PPO | Admitting: Adult Health

## 2018-04-16 DIAGNOSIS — G4761 Periodic limb movement disorder: Secondary | ICD-10-CM | POA: Diagnosis not present

## 2018-04-16 DIAGNOSIS — I4891 Unspecified atrial fibrillation: Secondary | ICD-10-CM

## 2018-04-16 DIAGNOSIS — M869 Osteomyelitis, unspecified: Secondary | ICD-10-CM

## 2018-04-16 DIAGNOSIS — E785 Hyperlipidemia, unspecified: Secondary | ICD-10-CM | POA: Diagnosis not present

## 2018-04-16 DIAGNOSIS — I6783 Posterior reversible encephalopathy syndrome: Secondary | ICD-10-CM

## 2018-04-16 DIAGNOSIS — I10 Essential (primary) hypertension: Secondary | ICD-10-CM | POA: Diagnosis not present

## 2018-04-16 DIAGNOSIS — D649 Anemia, unspecified: Secondary | ICD-10-CM | POA: Diagnosis not present

## 2018-04-16 DIAGNOSIS — F330 Major depressive disorder, recurrent, mild: Secondary | ICD-10-CM

## 2018-04-16 NOTE — Consult Note (Signed)
[facility].  Chart reviewed. Noted Ms. Newingham [facility] on [facility]. Will updated [facility].  [name], MSN-Ed, RN,BSN [facility] [phone]

## 2018-04-16 NOTE — Progress Notes (Signed)
Location:   [facility] Room Number: 102 A Place of Service:  SNF (31)   CODE STATUS: Full Code  Allergies  Allergen Reactions  . Zolpidem Tartrate Other (See Comments)    Delusions, hallucinations  . Amoxicillin Rash and Other (See Comments)    Has patient had a PCN reaction causing immediate rash, facial/tongue/throat swelling, SOB or lightheadedness with hypotension: Yes Has patient had a PCN reaction causing severe rash involving mucus membranes or skin necrosis: No Has patient had a PCN reaction that required hospitalization: No Has patient had a PCN reaction occurring within the last 10 years: Yes If all of the above answers are "NO", then may proceed with Cephalosporin use.     Chief Complaint  Patient presents with  . Hospitalization Follow-up    Hospial Follow up    HPI:  She is a 67 year old who has been hospitalized from 04-02-18 through 04-14-18. She has been having issues with right foot ulcerations. She had been hospitalized in April for her ulceration and underwent surgical I/D. On 04-02-18: she was found on the floor by a care giver. She was treated for acute metabolic encephalopathy thought secondary to PRES. She has been restarted on her eliquis. Her mental status is improving. She was seen by electrophysiology and her Joice Loftstikosyn was stopped. She has been started on amiodarone.  She was taken off  Her abt for her osteomyelitis per I/D. She is here for short term rehab. She tells me that her goal is to return back home. I am not certain if this will be possible for her. She denies any chest pain or palpitations; she denies any uncontrolled pain. She will continue to be followed her chronic illnesses including: hypertension; afib; and anemia. There are no nursing concerns at this time.    Past Medical History:  Diagnosis Date  . Anxiety   . 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  PT ALSO HAS LOWER BACK PAIN AT TIMES  . Depression   . GERD  (gastroesophageal reflux disease)    RARE- OTC ANTACID IF NEEDED--PAST HX OF ULCER  . Headache    wakes up with headache   . Hyperlipidemia   . Hypertension   . Kidney stones    PT PASSED STONES--NO KNOWN STONES AT PRESENT TIMES  . Lumbago 10/02/2013  . Obesity   . OSA (obstructive sleep apnea) 12/30/2013   Very mild OSA with REM accentuation,  [facility] Dr Frances FurbishAthar .   Marland Kitchen. Periodic limb movement disorder (PLMD) 12/30/2013  . Restless leg syndrome   . Ulcer   . Urinary incontinence     Past Surgical History:  Procedure Laterality Date  . ABDOMINAL AORTOGRAM W/LOWER EXTREMITY N/A 10/10/2017   Procedure: ABDOMINAL AORTOGRAM W/LOWER EXTREMITY;  Surgeon: Nada LibmanBrabham, Vance W, MD;  Location: [facility] INVASIVE CV LAB;  Service: Cardiovascular;  Laterality: N/A;  . BILATERAL BUNIONECTOMY 1979    . BREAST SURGERY     biopsy   . COLON SURGERY    . PERIPHERAL VASCULAR INTERVENTION  10/10/2017   Procedure: PERIPHERAL VASCULAR INTERVENTION;  Surgeon: Nada LibmanBrabham, Vance W, MD;  Location: [facility] INVASIVE CV LAB;  Service: Cardiovascular;;  lt. Iliac  . SMALL INTESTINE SURGERY    . SURGERY FOR BOWEL OBSTRUCTION AND REMOVAL OF PART OF STOMACH  IN 2000  2000  . TOTAL KNEE ARTHROPLASTY  02/16/2012   Procedure: TOTAL KNEE ARTHROPLASTY;  Surgeon: Javier DockerJeffrey C Beane, MD;  Location: [facility] ORS;  Service: Orthopedics;  Laterality: Left;  . TOTAL KNEE ARTHROPLASTY Right 11/20/2014   Procedure: RIGHT TOTAL KNEE ARTHROPLASTY;  Surgeon: Javier Docker, MD;  Location: [facility] ORS;  Service: Orthopedics;  Laterality: Right;    Social History   Socioeconomic History  . Marital status: Single    Spouse name: Not on file  . Number of children: 0  . Years of education: 59  . Highest education level: Not on file  Occupational History  . Not on file  Social Needs  . Financial resource strain: Not on file  . Food insecurity:    Worry: Not on file    Inability: Not on file  . Transportation needs:    Medical: Not on file      Non-medical: Not on file  Tobacco Use  . Smoking status: Former Smoker    Packs/day: 2.00    Years: 20.00    Pack years: 40.00    Types: Cigarettes    Last attempt to quit: 10/17/1988    Years since quitting: 29.5  . Smokeless tobacco: Never Used  Substance and Sexual Activity  . Alcohol use: No  . Drug use: No    Comment: Marijuana - 25 years ago  . Sexual activity: Not on file  Lifestyle  . Physical activity:    Days per week: Not on file    Minutes per session: Not on file  . Stress: Not on file  Relationships  . Social connections:    Talks on phone: Not on file    Gets together: Not on file    Attends religious service: Not on file    Active member of club or organization: Not on file    Attends meetings of clubs or organizations: Not on file    Relationship status: Not on file  . Intimate partner violence:    Fear of current or ex partner: Not on file    Emotionally abused: Not on file    Physically abused: Not on file    Forced sexual activity: Not on file  Other Topics Concern  . Not on file  Social History Narrative   Patient is single and lives alone.   Patient is disabled.   Patient has a college education.   Patient is right-handed.   Patient drinks two cokes per day.   Family History  Problem Relation Age of Onset  . Sleep apnea Father   . Hypertension Father   . Hypertension Mother   . Multiple sclerosis Sister   . Multiple sclerosis Sister       VITAL SIGNS BP 139/76   Pulse 78   Temp 98.4 F (36.9 C)   Resp 18   Ht 5\' 5"  (1.651 m)   LMP  (LMP Unknown)   SpO2 98%   BMI 36.54 kg/m   Outpatient Encounter Medications as of 04/16/2018  Medication Sig  . Amino Acids-Protein Hydrolys (FEEDING SUPPLEMENT, PRO-STAT SUGAR FREE 64,) LIQD Take 30 mLs by mouth 3 (three) times daily.  Marland Kitchen amiodarone (PACERONE) 200 MG tablet Take 1 tablet (200 mg total) by mouth 2 (two) times daily.  . benztropine (COGENTIN) 0.5 MG tablet Take 1.5 mg by mouth  daily.  Marland Kitchen buPROPion (WELLBUTRIN XL) 150 MG 24 hr tablet Take 150 mg by mouth 2 (two) times daily.   . carvedilol (COREG) 25 MG tablet Take 1 tablet (25 mg total) by mouth 2 (two) times daily with a meal.  . cyanocobalamin (,VITAMIN B-12,) 1000 MCG/ML injection Inject 1,000 mcg  into the muscle every 30 (thirty) days.  Marland Kitchen diltiazem (CARDIZEM) 30 MG tablet Take 1 tablet (30 mg total) by mouth every 12 (twelve) hours.  Marland Kitchen ELIQUIS 5 MG TABS tablet Take 5 mg by mouth 2 (two) times daily.  . fluticasone (FLONASE) 50 MCG/ACT nasal spray Place 1 spray into both nostrils daily as needed for allergies.   Marland Kitchen iron polysaccharides (NIFEREX) 150 MG capsule Take 150 mg by mouth daily.  . Magnesium Oxide 400 MG CAPS Take 1 capsule (400 mg total) by mouth daily.  . Multiple Vitamins-Minerals (MULTIVITAMIN & MINERAL PO) Take 1 tablet by mouth daily.  . rosuvastatin (CRESTOR) 5 MG tablet Take 5 mg by mouth daily.  Marland Kitchen saccharomyces boulardii (FLORASTOR) 250 MG capsule Take 1 capsule (250 mg total) by mouth 2 (two) times daily.  Marland Kitchen vortioxetine HBr (TRINTELLIX) 5 MG TABS tablet Take 5 mg by mouth daily.  . [DISCONTINUED] pravastatin (PRAVACHOL) 40 MG tablet Take 40 mg by mouth daily.    No facility-administered encounter medications on file as of 04/16/2018.      SIGNIFICANT DIAGNOSTIC EXAMS  TODAY:   04-02-18: ct of head: 1. Bilateral occipital lobe/PCA territory acute infarcts versusbPRES. RIGHT occipital lobe petechial hemorrhage. 2. Small potentially acute LEFT MCA territory infarct or PRES. [place] LEFT MCA territory infarct, further propagation from prior CT. 3. Moderate parenchymal brain volume loss. Moderate chronic smallbvessel ischemic changes.  04-02-18: MRI of brain: 1. Severe motion degradation. 2. T2 hyperintense signal abnormality involving cortex andbsubcortical white matter in a posterior distribution. Scattered cortical reduced diffusion. Findings are consistent with PRES and atypical for ischemia.  No gross acute hemorrhage identified.  04-02-18: chest x-ray: No acute abnormalities.  04-02-18: left foot x-ray: No acute abnormalities. Degenerative changes as described. Chronic soft tissue swelling lateral to the fifth MTP joint.   04-02-18: right foot x-ray: No acute bone abnormality. No radiographic evidence suggestive of osteomyelitis. Soft tissue abscess on the ball of the foot as described.  04-04-18: MRI/MRA of head:  MRI head: 1. Stable distribution of T2 FLAIR signal abnormality involving subcortical white matter and cortex in the posterior distribution. Diminished diffusion signal abnormality. 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 2. No new acute intracranial abnormality identified. 3. 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 MRA head: 1. Motion degraded study, suboptimal assessment for small aneurysm or stenosis. 2. Patent anterior and posterior intracranial circulation. No large vessel occlusion.  LABS REVIEWED: TODAY:  04-02-18: wbc 13.5; hgb 12.9; hct 43.4; mcv 84.8; plt 530; glucose 135; bun 37; creat 1.40; k+ 4.0; na++ 141; ca 9.3; ast 74; albumin 2.9 blood culture: no growth; urine culture:  e-coli;  04-04-18: wbc 8.8; hgb 10.8; hct 35.8; mcv 83.4; plt 340; glucose 109; bun 14; creat 0.96; k+ 3.2 na++ 136; ca 8.6 mag 1.6; phos 2.3 04-08-18: vit B 12: 371; folate 6.1; iron 19; tibc 190; ferritin 32 04-10-18: wbc 7.8; hgb 10.7; hct 35.9; mcv 84.5 plt 384; glucose 106; bun 17; creat 0.85; k+ 4.1; na++ 137; ca 8.6   Review of Systems  Constitutional: Negative for malaise/fatigue.  Respiratory: Negative for cough and shortness of breath.   Cardiovascular: Negative for chest pain, palpitations and leg swelling.  Gastrointestinal: Negative for abdominal pain, constipation and heartburn.  Musculoskeletal: Negative for myalgias.  Skin: Negative.   Neurological: Negative for  dizziness.  Psychiatric/Behavioral: The patient is not nervous/anxious.     Physical Exam  Constitutional: She  appears well-developed and well-nourished. No distress.  Neck: No thyromegaly present.  Cardiovascular: Normal rate, normal heart sounds and intact distal pulses.  Heart rate irregular   Pulmonary/Chest: Effort normal and breath sounds normal. No respiratory distress.  Abdominal: Soft. Bowel sounds are normal. She exhibits no distension. There is no tenderness.  Musculoskeletal: Normal range of motion. She exhibits no edema.  Lymphadenopathy:    She has no cervical adenopathy.  Neurological: She is alert.  Skin: Skin is warm and dry. She is not diaphoretic.  Right foot dressing intact   Psychiatric: She has a normal mood and affect.     ASSESSMENT/ PLAN:  TODAY:   1. Atrial fibrillation with RVR: heart rate is stable will continue coreg 25 mg twice daily and cardizem 30 mg twice daily  amiodarone 200 mg twice daily for rate control will continue eliquis 5 mg twice daily   2. Essential hypertension: is stable b/p 139/76: will continue coreg 25 mg twice daily cardizem 20 mg twice daily  3.  Anemia: is stable hgb 10.7; will continue niferex 150 mg daily; vit B 12 injection monthly  4. Mild episode of recurrent major depressive disorder: is stable will continue wellbturin xr 150 mg twice daily trintellix 5 mg daily   5. Periodic limb movement disorder (PLMD): is stable will continue cogentin 1.5 mg nightly   6. Dyslipidemia: is stable will continue crestor 5 mg daily   7. PRES (posterior reversible encephalopathy syndrome) is stable will continue therapy as directed to improve upon her level of independence  8. Osteomyelitis of great toe of left foot: is stable is off abt; is followed by Dr. Lajoyce Corners. Will monitor      MD is aware of resident's narcotic use and is in agreement with current plan of care. We will attempt to wean resident as apropriate   Synthia Innocent  NP [facility]  Contact [phone] Monday through Friday 8am- 5pm  After hours call [phone]

## 2018-04-16 NOTE — Consult Note (Signed)
[facility] to make aware that Ms. Soroka discharged to SNF.  Raiford NobleAtika Talyn Eddie, MSN-Ed, RN,BSN [facility] Management Hospital Liaison [phone]

## 2018-04-17 DIAGNOSIS — L97521 Non-pressure chronic ulcer of other part of left foot limited to breakdown of skin: Secondary | ICD-10-CM | POA: Diagnosis not present

## 2018-04-17 DIAGNOSIS — L89322 Pressure ulcer of left buttock, stage 2: Secondary | ICD-10-CM | POA: Diagnosis not present

## 2018-04-18 ENCOUNTER — Encounter: Payer: Self-pay | Admitting: *Deleted

## 2018-04-18 ENCOUNTER — Ambulatory Visit: Payer: PPO | Admitting: Nurse Practitioner

## 2018-04-18 ENCOUNTER — Other Ambulatory Visit: Payer: Self-pay | Admitting: *Deleted

## 2018-04-18 NOTE — Patient Outreach (Signed)
[facility] Care Management  04/18/2018  [name] Aug 12, 1951 [id number]   CSW was able to make initial contact with patient today to perform the assessment, as well as assess and assist with social work needs and services, when [place] met with patient at [facility] [place] [place] where patient currently resides to receive short-term rehabilitative services.  CSW introduced self, explained role and types of services provided through [place] [facility] ([place] [facility]).  CSW further explained to patient that CSW works with Marthenia Rolling, [facility], also with [place] [facility]. CSW then explained the reason for the visit, indicating that Ms. [name] thought that patient would benefit from social work services and resources to assist with discharge planning needs and services from the skilled nursing facility.  CSW obtained two HIPAA compliant identifiers from patient, which included patient's name and date of birth. Patient admits that she is happy to be at [facility] [place], realizing that she did not need to return home without rehabilitative services.  Patient reported that she is already working with therapies (both physical and occupational), despite having bilateral chronic wounds/ulcers in her lower extremities.  Patient is currently receiving wound care and dressing changes, which will continue through home health services, once patient is discharged back home.  Patient lives alone but reports receiving a great deal of support through family members.  Patient is not able to perform activities of daily living independently; therefore, she will need to remain at [facility] [place] for at least three more weeks, which is what she has already been told by her attending nurse.  CSW will follow-up with patient in two weeks to assess and assist with discharge planning needs and services. Nat Christen,  BSW, MSW, LCSW  Licensed Education officer, [facility]  [place], [place], [place] [address] Physical Address-[address], [place], [place] [address] Toll Free Main # [phone] Fax # [phone] Cell # [phone]  Office # [phone] [email or web address]

## 2018-04-23 ENCOUNTER — Encounter: Payer: Self-pay | Admitting: Internal Medicine

## 2018-04-23 ENCOUNTER — Ambulatory Visit (INDEPENDENT_AMBULATORY_CARE_PROVIDER_SITE_OTHER): Payer: PPO | Admitting: Nurse Practitioner

## 2018-04-23 ENCOUNTER — Non-Acute Institutional Stay (SKILLED_NURSING_FACILITY): Payer: PPO | Admitting: Internal Medicine

## 2018-04-23 ENCOUNTER — Encounter: Payer: Self-pay | Admitting: Nurse Practitioner

## 2018-04-23 ENCOUNTER — Encounter

## 2018-04-23 VITALS — BP 110/68 | HR 83 | Ht 65.0 in

## 2018-04-23 DIAGNOSIS — E785 Hyperlipidemia, unspecified: Secondary | ICD-10-CM | POA: Insufficient documentation

## 2018-04-23 DIAGNOSIS — I4819 Other persistent atrial fibrillation: Secondary | ICD-10-CM

## 2018-04-23 DIAGNOSIS — D649 Anemia, unspecified: Secondary | ICD-10-CM

## 2018-04-23 DIAGNOSIS — I481 Persistent atrial fibrillation: Secondary | ICD-10-CM | POA: Diagnosis not present

## 2018-04-23 DIAGNOSIS — R5381 Other malaise: Secondary | ICD-10-CM | POA: Diagnosis not present

## 2018-04-23 DIAGNOSIS — I6783 Posterior reversible encephalopathy syndrome: Secondary | ICD-10-CM

## 2018-04-23 DIAGNOSIS — Z7901 Long term (current) use of anticoagulants: Secondary | ICD-10-CM | POA: Diagnosis not present

## 2018-04-23 DIAGNOSIS — F330 Major depressive disorder, recurrent, mild: Secondary | ICD-10-CM

## 2018-04-23 DIAGNOSIS — I10 Essential (primary) hypertension: Secondary | ICD-10-CM

## 2018-04-23 DIAGNOSIS — Z79899 Other long term (current) drug therapy: Secondary | ICD-10-CM

## 2018-04-23 DIAGNOSIS — I480 Paroxysmal atrial fibrillation: Secondary | ICD-10-CM

## 2018-04-23 DIAGNOSIS — M869 Osteomyelitis, unspecified: Secondary | ICD-10-CM | POA: Diagnosis not present

## 2018-04-23 MED ORDER — AMIODARONE HCL 200 MG PO TABS: 200.0000 mg | ORAL_TABLET | Freq: Every day | ORAL | 6 refills | Status: AC

## 2018-04-23 MED ORDER — AMIODARONE HCL 200 MG PO TABS: 200.0000 mg | ORAL_TABLET | Freq: Every day | ORAL | 0 refills | Status: DC

## 2018-04-23 NOTE — Progress Notes (Signed)
Patient ID: [name], female   DOB: Feb 09, 1951, 67 y.o.   MRN: [id number]  Provider:  DR Elmon Kirschner Location:  [facility] Room Number: 102 A Place of Service:  SNF (31)  PCP: Jarome Matin, MD Patient Care Team: Jarome Matin, MD as PCP - General (Internal Medicine) Toni Arthurs, MD as Consulting Physician (Orthopedic Surgery) Jerald Kief, PA-C as Physician Assistant (Physician Assistant) Almetta Lovely, NP as [facility] Care Management Saporito, Fanny Dance, LCSW as [facility] Care Management  Extended Emergency Contact Information Primary Emergency Contact: Dwyane Luo [place] Phone: [phone] Relation: Sister Secondary Emergency Contact: Calli, [place] Home Phone: [phone] Mobile Phone: [phone] Relation: Brother Interpreter needed? No  Code Status: Full Code Goals of Care: Advanced Directive information Advanced Directives 04/23/2018  Does Patient Have a Medical Advance Directive? No  Would patient like information on creating a medical advance directive? No - Patient declined  Pre-existing out of facility DNR order (yellow form or pink MOST form) -      Chief Complaint  Patient presents with  . New Admit To SNF    Admission    HPI: Patient is a 67 y.o. female seen today for admission to SNF following hospital stay for acute metabolic encephalopathy, afib with RVR, b/l foot abscess with osteomyelitis, ESBL E coli UTI, demand ischemia, diarrhea, OSA, anemia, depression and anxiety. She was found down at home by [facility]. CT head revealed infarct vs PRES. MRI brain c/w PRES. She was tx with IV cleviprex. She was started on amiodarone. She was told to f/u with EP post d/c for afib with RVR.  ID followed chronic LE ulcers. Consults included neurology, cardiology and Ortho. UTI tx with IV meropenum. Foley cath placed but was d/c'd prior to d/c. Psych meds adjusted.Hgb 10.7;  Cr 1.4-->0.85; ferritin 32; iron 19; B12 level 371; albumin 2.9; AST 74 at d/c. She presents to SNF for short term rehab.   Today she has no concerns. She gets confused at times. No falls since SNF admission. Appetite ok and sleeps well. She is a poor historian due to mental status. Hx obtained from chart.    Past Medical History:  Diagnosis Date  . Anxiety   . 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  PT ALSO HAS LOWER BACK PAIN AT TIMES  . Depression   . GERD (gastroesophageal reflux disease)    RARE- OTC ANTACID IF NEEDED--PAST HX OF ULCER  . Headache    wakes up with headache   . Hyperlipidemia   . Hypertension   . Kidney stones    PT PASSED STONES--NO KNOWN STONES AT PRESENT TIMES  . Lumbago 10/02/2013  . Obesity   . OSA (obstructive sleep apnea) 12/30/2013   Very mild OSA with REM accentuation,  tested 11-29-13 at [facility] sleep, Dr Frances Furbish .   Marland Kitchen Periodic limb movement disorder (PLMD) 12/30/2013  . Restless leg syndrome   . Ulcer   . Urinary incontinence    Past Surgical History:  Procedure Laterality Date  . ABDOMINAL AORTOGRAM W/LOWER EXTREMITY N/A 10/10/2017   Procedure: ABDOMINAL AORTOGRAM W/LOWER EXTREMITY;  Surgeon: Nada Libman, MD;  Location: [facility] INVASIVE CV LAB;  Service: Cardiovascular;  Laterality: N/A;  . BILATERAL BUNIONECTOMY 1979    . BREAST SURGERY     biopsy   . COLON SURGERY    . PERIPHERAL VASCULAR INTERVENTION  10/10/2017   Procedure: PERIPHERAL VASCULAR INTERVENTION;  Surgeon: Nada Libman, MD;  Location: [facility] INVASIVE CV LAB;  Service: Cardiovascular;;  lt. Iliac  . SMALL INTESTINE SURGERY    . SURGERY FOR BOWEL OBSTRUCTION AND REMOVAL OF PART OF STOMACH  IN 2000  2000  . TOTAL KNEE ARTHROPLASTY  02/16/2012   Procedure: TOTAL KNEE ARTHROPLASTY;  Surgeon: Javier Docker, MD;  Location: [facility] ORS;  Service: Orthopedics;  Laterality: Left;  . TOTAL KNEE ARTHROPLASTY Right 11/20/2014   Procedure: RIGHT TOTAL KNEE ARTHROPLASTY;  Surgeon: Javier Docker, MD;  Location: [facility] ORS;  Service: Orthopedics;  Laterality: Right;    reports that she quit smoking about 29 years ago. Her smoking use included cigarettes. She has a 40.00 pack-year smoking history. She has never used smokeless tobacco. She reports that she does not drink alcohol or use drugs. Social History   Socioeconomic History  . Marital status: Single    Spouse name: Not on file  . Number of children: 0  . Years of education: 18  . Highest education level: Not on file  Occupational History  . Not on file  Social Needs  . Financial resource strain: Not on file  . Food insecurity:    Worry: Not on file    Inability: Not on file  . Transportation needs:    Medical: Not on file    Non-medical: Not on file  Tobacco Use  . Smoking status: Former Smoker    Packs/day: 2.00    Years: 20.00    Pack years: 40.00    Types: Cigarettes    Last attempt to quit: 10/17/1988    Years since quitting: 29.5  . Smokeless tobacco: Never Used  Substance and Sexual Activity  . Alcohol use: No  . Drug use: No    Comment: Marijuana - 25 years ago  . Sexual activity: Not on file  Lifestyle  . Physical activity:    Days per week: Not on file    Minutes per session: Not on file  . Stress: Not on file  Relationships  . Social connections:    Talks on phone: Not on file    Gets together: Not on file    Attends religious service: Not on file    Active member of club or organization: Not on file    Attends meetings of clubs or organizations: Not on file    Relationship status: Not on file  . Intimate partner violence:    Fear of current or ex partner: Not on file    Emotionally abused: Not on file    Physically abused: Not on file    Forced sexual activity: Not on file  Other Topics Concern  . Not on file  Social History Narrative   Patient is single and lives alone.   Patient is disabled.   Patient has a college education.   Patient is right-handed.   Patient drinks two cokes  per day.    Functional Status Survey:    Family History  Problem Relation Age of Onset  . Sleep apnea Father   . Hypertension Father   . Hypertension Mother   . Multiple sclerosis Sister   . Multiple sclerosis Sister     Health Maintenance  Topic Date Due  . FOOT EXAM  05/17/2018 (Originally 04/18/1961)  . MAMMOGRAM  05/17/2018 (Originally 07/19/2015)  . HEMOGLOBIN A1C  05/17/2018 (Originally 12/04/2017)  . OPHTHALMOLOGY EXAM  05/17/2018 (Originally 04/18/1961)  . URINE MICROALBUMIN  05/17/2018 (Originally 04/18/1961)  . DEXA SCAN  05/17/2018 (  Originally 04/18/2016)  . COLONOSCOPY  05/17/2018 (Originally 04/18/2001)  . TETANUS/TDAP  05/17/2018 (Originally 04/18/1970)  . Hepatitis C Screening  05/17/2018 (Originally 1951-02-15)  . PNA vac Low Risk Adult (2 of 2 - PPSV23) 05/17/2018 (Originally 12/20/2017)  . INFLUENZA VACCINE  04/26/2018    Allergies  Allergen Reactions  . Zolpidem Tartrate Other (See Comments)    Delusions, hallucinations  . Amoxicillin Rash and Other (See Comments)    Has patient had a PCN reaction causing immediate rash, facial/tongue/throat swelling, SOB or lightheadedness with hypotension: Yes Has patient had a PCN reaction causing severe rash involving mucus membranes or skin necrosis: No Has patient had a PCN reaction that required hospitalization: No Has patient had a PCN reaction occurring within the last 10 years: Yes If all of the above answers are "NO", then may proceed with Cephalosporin use.     Outpatient Encounter Medications as of 04/23/2018  Medication Sig  . Amino Acids-Protein Hydrolys (FEEDING SUPPLEMENT, PRO-STAT SUGAR FREE 64,) LIQD Take 30 mLs by mouth 3 (three) times daily.  Marland Kitchen amiodarone (PACERONE) 200 MG tablet Take 1 tablet (200 mg total) by mouth 2 (two) times daily.  . benztropine (COGENTIN) 0.5 MG tablet Take 1.5 mg by mouth 2 (two) times daily.   Marland Kitchen buPROPion (WELLBUTRIN XL) 150 MG 24 hr tablet Take 150 mg by mouth 2 (two) times  daily.   . carvedilol (COREG) 25 MG tablet Take 1 tablet (25 mg total) by mouth 2 (two) times daily with a meal.  . cyanocobalamin (,VITAMIN B-12,) 1000 MCG/ML injection Inject 1,000 mcg into the muscle every 30 (thirty) days.  Marland Kitchen diltiazem (CARDIZEM) 30 MG tablet Take 1 tablet (30 mg total) by mouth every 12 (twelve) hours.  Marland Kitchen ELIQUIS 5 MG TABS tablet Take 5 mg by mouth 2 (two) times daily.  . fluticasone (FLONASE) 50 MCG/ACT nasal spray Place 1 spray into both nostrils daily as needed for allergies.   Marland Kitchen iron polysaccharides (NIFEREX) 150 MG capsule Take 150 mg by mouth daily.  . Magnesium Oxide 400 MG CAPS Take 1 capsule (400 mg total) by mouth daily.  . Multiple Vitamins-Minerals (MULTIVITAMIN & MINERAL PO) Take 1 tablet by mouth daily.  . rosuvastatin (CRESTOR) 5 MG tablet Take 5 mg by mouth daily.  Marland Kitchen saccharomyces boulardii (FLORASTOR) 250 MG capsule Take 1 capsule (250 mg total) by mouth 2 (two) times daily.  Marland Kitchen vortioxetine HBr (TRINTELLIX) 5 MG TABS tablet Take 5 mg by mouth daily.   No facility-administered encounter medications on file as of 04/23/2018.     Review of Systems  Unable to perform ROS: Other (confused)    Vitals:   04/23/18 0942  BP: 132/72  Pulse: 76  Resp: 18  Temp: 98.2 F (36.8 C)  SpO2: 96%  Weight: 200 lb 14.4 oz (91.1 kg)  Height: 5\' 5"  (1.651 m)   Body mass index is 33.43 kg/m. Physical Exam  Constitutional: She appears well-developed.  Frail appearing in NAD, sitting in w/c  HENT:  Mouth/Throat: Oropharynx is clear and moist. No oropharyngeal exudate.  MMM; no oral thrush  Eyes: Pupils are equal, round, and reactive to light. No scleral icterus.  Neck: Neck supple. Carotid bruit is not present. No tracheal deviation present. No thyromegaly present.  Cardiovascular: Normal rate and intact distal pulses. An irregularly irregular rhythm present. Exam reveals no gallop and no friction rub.  Murmur (1/6 SEM) heard. Trace BLE edema. No calf TTP    Pulmonary/Chest: Effort normal and breath sounds normal.  No stridor. No respiratory distress. She has no wheezes. She has no rales.  Abdominal: Soft. Normal appearance and bowel sounds are normal. She exhibits no distension and no mass. There is no hepatomegaly. There is no tenderness. There is no rigidity, no rebound and no guarding. No hernia.  Musculoskeletal: She exhibits edema.  Lymphadenopathy:    She has no cervical adenopathy.  Neurological: She is alert. She has normal reflexes.  Skin: Skin is warm and dry. No rash noted.  Multiple contusions; facility wound care provider following LE wounds  Psychiatric: She has a normal mood and affect. Her behavior is normal. Thought content normal.    Labs reviewed: Basic Metabolic Panel: Recent Labs    04/04/18 0734 04/05/18 0436 04/06/18 0237 04/06/18 1838 04/08/18 0634 04/10/18 0519  NA 136 133* 137 135 135 137  K 3.2* 3.1* 3.1* 3.5 3.4* 4.1  CL 96* 95* 100 102 103 103  CO2 26 25 27 24 25 27   GLUCOSE 109* 118* 111* 121* 106* 106*  BUN 14 13 24* 20 19 17   CREATININE 0.96 0.94 1.04* 1.07* 0.82 0.85  CALCIUM 8.3* 8.3* 8.2* 8.3* 8.3* 8.6*  MG 1.6* 1.8 2.0  --   --   --   PHOS 2.3* 2.8 3.1  --   --   --    Liver Function Tests: Recent Labs    01/07/18 0932 01/08/18 0650 04/02/18 1634  AST 19 15 74*  ALT 12* 10* 42  ALKPHOS 117 105 138*  BILITOT 0.5 0.5 0.9  PROT 6.9 6.2* 8.2*  ALBUMIN 2.9* 2.5* 2.9*   Recent Labs    04/02/18 2206  LIPASE 43   Recent Labs    04/02/18 1634  AMMONIA 21   CBC: Recent Labs    01/10/18 0639  04/02/18 1634  04/04/18 0734 04/06/18 0237 04/08/18 0634 04/10/18 0519  WBC 7.3   < > 13.5*   < > 8.8 8.7 7.9 7.8  NEUTROABS 5.4  --  11.1*  --  6.5  --   --   --   HGB 9.7*   < > 12.9   < > 10.8* 10.4* 10.0* 10.7*  HCT 31.8*   < > 43.4   < > 35.8* 35.1* 33.7* 35.9*  MCV 84.4   < > 84.8   < > 83.4 84.0 84.0 84.5  PLT 344   < > 530*   < > 340 324 345 384   < > = values in this interval  not displayed.   Cardiac Enzymes: Recent Labs    04/02/18 1634 04/03/18 0007 04/03/18 0220 04/03/18 1049  CKTOTAL 219  --   --   --   TROPONINI  --  2.15* 1.84* 1.11*   BNP: Invalid input(s): POCBNP Lab Results  Component Value Date   HGBA1C 5.6 06/06/2017   Lab Results  Component Value Date   TSH 1.367 11/24/2014   Lab Results  Component Value Date   VITAMINB12 371 04/08/2018   Lab Results  Component Value Date   FOLATE 6.1 04/08/2018   Lab Results  Component Value Date   IRON 19 (L) 04/08/2018   TIBC 190 (L) 04/08/2018   FERRITIN 32 04/08/2018    Imaging and Procedures obtained prior to SNF admission: Ct Head Wo Contrast  Result Date: 04/02/2018 CLINICAL DATA:  Found down. Last seen normal 3 days ago. History of hypertension, hyperlipidemia. EXAM: CT HEAD WITHOUT CONTRAST TECHNIQUE: Contiguous axial images were obtained from the base of the skull through  the vertex without intravenous contrast. COMPARISON:  CT HEAD March 22, 2017 FINDINGS: Moderate motion degraded examination. BRAIN: Symmetric occipital lobe wedge-like hypodensities with focal blurring of the gray-white matter junction in RIGHT subcentimeter petechial hemorrhage. 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 Moderate parenchymal brain volume loss. No hydrocephalus. Patchy to confluent supratentorial white matter hypodensities. No midline shift or mass effect. No abnormal extra-axial fluid collections. VASCULAR: Moderate calcific atherosclerosis of the carotid siphons. SKULL: No skull fracture. No significant scalp soft tissue swelling. SINUSES/ORBITS: Trace RIGHT mastoid effusion.The included ocular globes and orbital contents are non-suspicious. OTHER: None. IMPRESSION: 1. Bilateral occipital lobe/PCA territory acute infarcts versus PRES. RIGHT occipital lobe petechial hemorrhage. 2. Small potentially acute LEFT MCA territory infarct or PRES. 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  3. Moderate parenchymal brain volume loss. Moderate chronic small vessel ischemic changes. 4. Acute findings discussed with and reconfirmed by Dr.Long on 04/02/2018 at 5:18 pm. Electronically Signed   By: Awilda Metroourtnay  Bloomer M.D.   On: 04/02/2018 17:20   Mr Brain Wo Contrast  Result Date: 04/02/2018 CLINICAL DATA:  67 y/o F; found down. Altered level of consciousness. Abnormal CT head. EXAM: MRI HEAD WITHOUT CONTRAST TECHNIQUE: Multiplanar, multiecho pulse sequences of the brain and surrounding structures were obtained without intravenous contrast. COMPARISON:  04/02/2018 CT head.  11/08/2013 MRI head. FINDINGS: Brain: Severe motion degradation. There is T2 hyperintense signal abnormality involving cortex and subcortical white matter in a posterior distribution involving predominantly the occipital and parietal lobes. There is scattered intermittent associated cortical reduced diffusion. Prominent retrocerebellar extra-axial space, probably a small arachnoid cyst. Very small chronic infarcts are present within the inferior cerebellar hemispheres bilaterally. Stable background of moderate chronic microvascular ischemic changes and parenchymal volume loss of the brain. No gross susceptibility hypointensity to indicate intracranial hemorrhage. Vascular: Motion degraded. Skull and upper cervical spine: Normal marrow signal. Sinuses/Orbits: Bilateral sphenoid sinus effusions. No abnormal signal of mastoid air cells. Orbits are unremarkable. Other: None. IMPRESSION: 1. Severe motion degradation. 2. T2 hyperintense signal abnormality involving cortex and subcortical white matter in a posterior distribution. Scattered cortical reduced diffusion. Findings are consistent with PRES and atypical for ischemia. No gross acute hemorrhage identified. Electronically Signed   By: Mitzi HansenLance  Furusawa-Stratton M.D.   On: 04/02/2018 20:11   Dg Chest Portable 1 View  Result Date: 04/02/2018 CLINICAL DATA:  Found down in her home today,  hypoxia, history hypertension, former smoker EXAM: PORTABLE CHEST 1 VIEW COMPARISON:  Portable exam 1521 hours compared to 01/19/2018 FINDINGS: Normal heart size, mediastinal contours, and pulmonary vascularity. Numerous EKG leads project over chest. Lungs clear. No acute infiltrate, pleural effusion or pneumothorax. Bones unremarkable. IMPRESSION: No acute abnormalities. Electronically Signed   By: Ulyses SouthwardMark  Boles M.D.   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 Calcified density in the left hemipelvis would be in keeping with the patient's calcified uterine fibroid. No bowel obstruction or significant dilatation no apparent free air. Degenerative change of the lower lumbar spine and left hip joint. IMPRESSION: Nonobstructed, nondistended bowel gas pattern. Pelvic calcification in keeping with the patient's known calcified uterine fibroid. Electronically Signed   By: Tollie Ethavid  Kwon M.D.   On: 04/02/2018 22:25   Dg Foot 2 Views Left  Result Date: 04/02/2018 CLINICAL DATA:  The patient was  found down today. Wound to the lower extremity. EXAM: LEFT FOOT - 2 VIEW COMPARISON:  Radiographs dated 06/05/2017 and MRI dated 12/08/2017 FINDINGS: Prominent hallux valgus deformity. Chronic arthritic changes of the first MTP joint. Soft tissue swelling lateral to the fifth MTP joint. Slight osteopenia at the fifth MTP joint without discrete bone destruction. Slight degenerative changes in the dorsum of the midfoot. IMPRESSION: No acute abnormalities. Degenerative changes as described. Chronic soft tissue swelling lateral to the fifth MTP joint. Electronically Signed   By: Francene Boyers M.D.   On: 04/02/2018 16:20   Dg Foot 2 Views Right  Result Date: 04/02/2018 CLINICAL DATA:  Wound on the right foot. EXAM: RIGHT FOOT - 2  VIEW COMPARISON:  Radiographs dated 01/07/2018 and 12/19/2017 and MRI dated 01/08/2018 and CT scan dated 06/08/2015 FINDINGS: There is a soft tissue ulceration on the plantar aspect of the ball of the foot the level of the first MTP joint. No acute bone abnormality. Chronic deformity at the fifth MTP joint. Chronic deformity in the midfoot from prior midfoot fractures. Those changes are stable. IMPRESSION: No acute bone abnormality. No radiographic evidence suggestive of osteomyelitis. Soft tissue abscess on the ball of the foot as described. Electronically Signed   By: Francene Boyers M.D.   On: 04/02/2018 16:02    Assessment/Plan   ICD-10-CM   1. Physical deconditioning R53.81   2. Osteomyelitis of great toe of left foot (HCC) M86.9   3. Persistent atrial fibrillation (HCC) I48.1   4. PRES (posterior reversible encephalopathy syndrome) I67.83   5. Essential hypertension I10   6. Mild episode of recurrent major depressive disorder (HCC) F33.0   7. Anemia, unspecified type D64.9   8. Dyslipidemia E78.5     Cont current meds as ordered  Wound care as ordered  F/u with specialists as scheduled  Nutritional supplements as ordered  PT/OT/ST as ordered  GOAL: short term rehab and d/c home when medically appropriate. Communicated with pt and nursing.  Will follow  Labs/tests ordered: none    Denisia Harpole S. Ancil Linsey  [facility] [address] [place], [place] [address] [phone] Cell (Monday-Friday 8 AM - 5 PM) [phone] After 5 PM and follow prompts

## 2018-04-23 NOTE — Patient Instructions (Addendum)
We will be checking the following labs today - BMET, CBC, HPF, TSH   Medication Instructions:    Continue with your current medicines.BUT  I am cutting the amiodarone to 200 mg a day    Testing/Procedures To Be Arranged:  N/A  Follow-Up:   See Dr. Ladona Ridgelaylor in 1 month with EKG  See Dr. Anne FuSkains in 4 months    Other Special Instructions:   N/A    If you need a refill on your cardiac medications before your next appointment, please call your pharmacy.   Call the [facility] office at [phone] if you have any questions, problems or concerns.

## 2018-04-23 NOTE — Progress Notes (Addendum)
CARDIOLOGY OFFICE NOTE  Date:  04/23/2018    [name] Date of Birth: [birth year] Medical Record #[id number]  PCP:  Jarome Matin, MD  Cardiologist:  Tyrone Sage & Skains & Ladona Ridgel  Chief Complaint  Patient presents with  . Atrial Fibrillation    Follow up/post hospital visit - seen for Dr. Ladona Ridgel & Anne Fu    History of Present Illness: [name] is a 67 y.o. female who presents today for a follow up/post hospital visit. Seen for Dr. Anne Fu & Ladona Ridgel.   She has had atrial fibrillation in the setting of HTN heart disease and morbid obesity. She has had symptomatic atrial fib for over a year which has been paroxysmal at times and persistent at times. She has had RVR and a slow VR and with medical therapy has also had sinus bradycardia. She has never had syncope. Other issues include HTN, HLD, & venous insufficiency and is prone to developing ulcers on her legs. 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  Referred to EP and saw Dr. Ladona Ridgel back in [facility] 2018- Tikosyn was initiated. She had to stop her Asenapine, HCTZ and her Verapamil. This has subsequently had to be cut back due to QT prolongation.   I have seen her several times - she has had lots of anxiety - Dr. Ladona Ridgel thought it would be ok to go back on her Clonazepam. She has had recurring cellulitis and leg ulcers. She has stopped her metoprolol due to presumed associated hair loss.   She last saw Dr. Ladona Ridgel in March - I last saw her in March. Having more issues with her foot ulcers and being treated for osteo. Was not going to be able to afford Tikosyn or Eliquis long term. BP pretty soft.   She was admitted earlier this month - had been found on the ground with altered mental status. Found to be hypertensive. Was in AF with RVR - probable demand ischemia. 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 She was seen by ID and neuro as well. Her Eliquis held 2/2 petechial hemorrhage, her Tikosyn held at admission, though on 04/03/18 did get on amiodarone gtt for RVR for rate control with her coreg transitioned to 200mg  BID PO on 04/05/18.  Comes in today. Here alone. Had to be placed in different wheelchair to enable access to the POD and exam room. Very poor historian. Rambling. Asking for Valium. Thinks she came from [facility]. She feels that it is March. Answers to what year is it "I've never done anything like that". Does know that Trump is the president.   Past Medical History:  Diagnosis Date  . Anxiety   . 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  PT ALSO HAS LOWER BACK PAIN AT TIMES  . Depression   . GERD (gastroesophageal reflux disease)    RARE- OTC ANTACID IF NEEDED--PAST HX OF ULCER  . Headache    wakes up with headache   . Hyperlipidemia   . Hypertension   . Kidney stones    PT PASSED STONES--NO KNOWN STONES AT PRESENT TIMES  . Lumbago 10/02/2013  . Obesity   . OSA (obstructive sleep apnea) 12/30/2013   Very mild OSA with REM accentuation,  tested 11-29-13 at [facility] sleep, Dr Frances Furbish .   Marland Kitchen Periodic limb movement disorder (PLMD) 12/30/2013  . Restless leg syndrome   .  Ulcer   . Urinary incontinence     Past Surgical History:  Procedure Laterality Date  . ABDOMINAL AORTOGRAM W/LOWER EXTREMITY N/A 10/10/2017   Procedure: ABDOMINAL AORTOGRAM W/LOWER EXTREMITY;  Surgeon: Nada Libman, MD;  [facility];  Service: Cardiovascular;  Laterality: N/A;  . BILATERAL BUNIONECTOMY 1979    . BREAST SURGERY     biopsy   . COLON SURGERY    . PERIPHERAL VASCULAR INTERVENTION  10/10/2017   Procedure: PERIPHERAL VASCULAR INTERVENTION;  Surgeon: Nada Libman, MD;  Location: [facility];  Service: Cardiovascular;;  lt. Iliac  . SMALL INTESTINE SURGERY    . SURGERY FOR BOWEL OBSTRUCTION AND REMOVAL OF PART OF STOMACH  IN 2000   2000  . TOTAL KNEE ARTHROPLASTY  02/16/2012   Procedure: TOTAL KNEE ARTHROPLASTY;  Surgeon: Javier Docker, MD;  Location: [facility];  Service: Orthopedics;  Laterality: Left;  . TOTAL KNEE ARTHROPLASTY Right 11/20/2014   Procedure: RIGHT TOTAL KNEE ARTHROPLASTY;  Surgeon: Javier Docker, MD;  Location: [facility];  Service: Orthopedics;  Laterality: Right;     Medications: Current Meds  Medication Sig  . Amino Acids-Protein Hydrolys (FEEDING SUPPLEMENT, PRO-STAT SUGAR FREE 64,) LIQD Take 30 mLs by mouth 3 (three) times daily.  . benztropine (COGENTIN) 0.5 MG tablet Take 1.5 mg by mouth 2 (two) times daily.   Marland Kitchen buPROPion (WELLBUTRIN XL) 150 MG 24 hr tablet Take 150 mg by mouth 2 (two) times daily.   . carvedilol (COREG) 25 MG tablet Take 1 tablet (25 mg total) by mouth 2 (two) times daily with a meal.  . cyanocobalamin (,VITAMIN B-12,) 1000 MCG/ML injection Inject 1,000 mcg into the muscle every 30 (thirty) days.  Marland Kitchen diltiazem (CARDIZEM) 30 MG tablet Take 1 tablet (30 mg total) by mouth every 12 (twelve) hours.  Marland Kitchen ELIQUIS 5 MG TABS tablet Take 5 mg by mouth 2 (two) times daily.  . fluticasone (FLONASE) 50 MCG/ACT nasal spray Place 1 spray into both nostrils daily as needed for allergies.   Marland Kitchen iron polysaccharides (NIFEREX) 150 MG capsule Take 150 mg by mouth daily.  . Magnesium Oxide 400 MG CAPS Take 1 capsule (400 mg total) by mouth daily.  . Multiple Vitamins-Minerals (MULTIVITAMIN & MINERAL PO) Take 1 tablet by mouth daily.  . rosuvastatin (CRESTOR) 5 MG tablet Take 5 mg by mouth daily.  Marland Kitchen saccharomyces boulardii (FLORASTOR) 250 MG capsule Take 1 capsule (250 mg total) by mouth 2 (two) times daily.  Marland Kitchen vortioxetine HBr (TRINTELLIX) 5 MG TABS tablet Take 5 mg by mouth daily.  . [DISCONTINUED] amiodarone (PACERONE) 200 MG tablet Take 1 tablet (200 mg total) by mouth daily.     Allergies: Allergies  Allergen Reactions  . Zolpidem Tartrate Other (See Comments)    Delusions, hallucinations  .  Amoxicillin Rash and Other (See Comments)    Has patient had a PCN reaction causing immediate rash, facial/tongue/throat swelling, SOB or lightheadedness with hypotension: Yes Has patient had a PCN reaction causing severe rash involving mucus membranes or skin necrosis: No Has patient had a PCN reaction that required hospitalization: No Has patient had a PCN reaction occurring within the last 10 years: Yes If all of the above answers are "NO", then may proceed with Cephalosporin use.     Social History: The patient  reports that she quit smoking about 29 years ago. Her smoking use included cigarettes. She has a 40.00 pack-year smoking history. She has never used smokeless tobacco. She  reports that she does not drink alcohol or use drugs.   Family History: The patient's family history includes Hypertension in her father and mother; Multiple sclerosis in her sister and sister; Sleep apnea in her father.   Review of Systems: Please see the history of present illness.   Otherwise, the review of systems is positive for none.   All other systems are reviewed and negative.   Physical Exam: VS:  BP 110/68   Pulse 83   Ht 5\' 5"  (1.651 m)   LMP  (LMP Unknown)   BMI 33.43 kg/m  .  BMI Body mass index is 33.43 kg/m.  Wt Readings from Last 3 Encounters:  04/23/18 200 lb 14.4 oz (91.1 kg)  04/14/18 219 lb 9.3 oz (99.6 kg)  04/08/18 225 lb (102.1 kg)    General: Chronically ill. Alert and in no acute distress.  She is a very poor historian. She seems dazed/confused at times.  HEENT: Normal. But lips are quite dry. Some bruising noted.  Neck: Supple, no JVD, carotid bruits, or masses noted.  Cardiac: Irregular irregular rhythm. Her rate is ok.  No edema.  Respiratory:  Lungs are clear to auscultation bilaterally with normal work of breathing.  GI: Soft and nontender.  MS: No deformity or atrophy. Gait not tested. She is not able to walk.  Skin: Warm and dry. Color is quite sallow.  Neuro:   Strength and sensation are intact and no gross focal deficits noted.  Psych: Alert, appropriate and with normal affect.   LABORATORY DATA:  EKG:  EKG is ordered today. This demonstrates AF with controlled VR of 83.  Lab Results  Component Value Date   WBC 7.8 04/10/2018   HGB 10.7 (L) 04/10/2018   HCT 35.9 (L) 04/10/2018   PLT 384 04/10/2018   GLUCOSE 106 (H) 04/10/2018   ALT 42 04/02/2018   AST 74 (H) 04/02/2018   NA 137 04/10/2018   K 4.1 04/10/2018   CL 103 04/10/2018   CREATININE 0.85 04/10/2018   BUN 17 04/10/2018   CO2 27 04/10/2018   TSH 1.367 11/24/2014   INR 1.13 04/02/2018   HGBA1C 5.6 06/06/2017       BNP (last 3 results) No results for input(s): BNP in the last 8760 hours.  ProBNP (last 3 results) No results for input(s): PROBNP in the last 8760 hours.   Other Studies Reviewed Today:  04/07/18: TTE Study Conclusions - Left ventricle: The cavity size was normal. Wall thickness was increased in a pattern of mild LVH. Systolic function was normal. The estimated ejection fraction was in the range of 60% to 65%. Wall motion was normal; there were no regional wall motion abnormalities. The study was not technically sufficient to allow evaluation of LV diastolic dysfunction due to atrial fibrillation. - Aortic valve: Mildly calcified annulus. Trileaflet. There was mild regurgitation. - Mitral valve: Mildly calcified annulus. There was mild regurgitation. - Left atrium: The atrium was moderately dilated. - Tricuspid valve: There was trivial regurgitation. - Pericardium, extracardiac: A prominent pericardial fat pad was present.    Myoview Study Highlights 2016   Nuclear stress EF: 60%.  There was no ST segment deviation noted during stress.  Defect 1: There is a small defect of mild severity present in the mid anterior and apical anterior location. This is likely breast attenuation, but cannot rule out infarct with mild  peri-infarct ischemia.  The study is normal.  This is a low risk study.  The left ventricular ejection  fraction is normal (55-65%).     Assessment/Plan:  1.PAF - was previously on Tikosyn - now on amiodarone - remains in AF with better rate control. Poor candidate for cardioversion in my opinion. CHADSVASC is at least 4. I am cutting her dose of amiodarone back to just 200 mg a day.   Per Dr. Ladona Ridgel at time of last admission - "She is back in atrial fib and is not a candidate for NSR currently without a TEE. I would suggest she continue her rate control with amio and her beta blocker. I am a little nervous about restarting her systemic anti-coagulation. I would suggest leaving her in atrial fib. Do not continue her ASA if she is getting Eliquis.  Follow her for bleeding".   2. Chronic anticoagulation - she is back on Eliquis per neurology - needs labs  3. High risk medicine - cutting amiodarone back to just one a day. Needs further surveillance labs. Unclear to me if this will be a long term drug. Will defer to Dr. Ladona Ridgel.   4. HTN - BP has improved - no other med changes made today.   5. Altered mental status/encephalopathy - she is clearly not at baseline from when I previously saw. I doubt she would be able to return home - she currently cannot walk and is not able to care for herself. Her overall prognosis appears poor to me.   6. Obesity - not discussed  7. Osteo/foot ulcers - seeing ID and Dr. Lajoyce Corners    Current medicines are reviewed with the patient today.  The patient does not have concerns regarding medicines other than what has been noted above.  The following changes have been made:  See above.  Labs/ tests ordered today include:    Orders Placed This Encounter  Procedures  . Basic metabolic panel  . CBC  . Hepatic function panel  . TSH  . EKG 12-Lead     Disposition:   FU with Dr. Ladona Ridgel in one month with EKG. Will get her back to general cardiology as  well.   Patient is agreeable to this plan and will call if any problems develop in the interim.   SignedNorma Fredrickson, NP  04/23/2018 2:11 PM  [facility] [address] [place], [place]  [address] Phone: [phone] Fax: [phone]

## 2018-04-24 DIAGNOSIS — L89322 Pressure ulcer of left buttock, stage 2: Secondary | ICD-10-CM | POA: Diagnosis not present

## 2018-04-24 DIAGNOSIS — L97521 Non-pressure chronic ulcer of other part of left foot limited to breakdown of skin: Secondary | ICD-10-CM | POA: Diagnosis not present

## 2018-04-24 LAB — HEPATIC FUNCTION PANEL
ALT: 22 IU/L (ref 0–32)
AST: 22 IU/L (ref 0–40)
Albumin: 3.2 g/dL — ABNORMAL LOW (ref 3.6–4.8)
Alkaline Phosphatase: 147 IU/L — ABNORMAL HIGH (ref 39–117)
Bilirubin Total: 0.4 mg/dL (ref 0.0–1.2)
Bilirubin, Direct: 0.14 mg/dL (ref 0.00–0.40)
Total Protein: 7 g/dL (ref 6.0–8.5)

## 2018-04-24 LAB — BASIC METABOLIC PANEL
BUN/Creatinine Ratio: 28 (ref 12–28)
BUN: 37 mg/dL — ABNORMAL HIGH (ref 8–27)
CO2: 21 mmol/L (ref 20–29)
Calcium: 9.2 mg/dL (ref 8.7–10.3)
Chloride: 103 mmol/L (ref 96–106)
Creatinine, Ser: 1.3 mg/dL — ABNORMAL HIGH (ref 0.57–1.00)
GFR calc Af Amer: 49 mL/min/{1.73_m2} — ABNORMAL LOW (ref >59)
GFR calc non Af Amer: 43 mL/min/{1.73_m2} — ABNORMAL LOW (ref >59)
Glucose: 109 mg/dL — ABNORMAL HIGH (ref 65–99)
Potassium: 3.9 mmol/L (ref 3.5–5.2)
Sodium: 139 mmol/L (ref 134–144)

## 2018-04-24 LAB — CBC
Hematocrit: 33.7 % — ABNORMAL LOW (ref 34.0–46.6)
Hemoglobin: 10.3 g/dL — ABNORMAL LOW (ref 11.1–15.9)
MCH: 24.9 pg — ABNORMAL LOW (ref 26.6–33.0)
MCHC: 30.6 g/dL — ABNORMAL LOW (ref 31.5–35.7)
MCV: 81 fL (ref 79–97)
Platelets: 418 10*3/uL (ref 150–450)
RBC: 4.14 x10E6/uL (ref 3.77–5.28)
RDW: 17.8 % — ABNORMAL HIGH (ref 12.3–15.4)
WBC: 6.1 10*3/uL (ref 3.4–10.8)

## 2018-04-24 LAB — TSH: TSH: 5.53 u[IU]/mL — ABNORMAL HIGH (ref 0.450–4.500)

## 2018-04-25 ENCOUNTER — Encounter: Payer: Self-pay | Admitting: Infectious Diseases

## 2018-04-25 ENCOUNTER — Telehealth: Payer: Self-pay

## 2018-04-25 ENCOUNTER — Ambulatory Visit (INDEPENDENT_AMBULATORY_CARE_PROVIDER_SITE_OTHER): Payer: PPO | Admitting: Infectious Diseases

## 2018-04-25 DIAGNOSIS — S81802D Unspecified open wound, left lower leg, subsequent encounter: Secondary | ICD-10-CM

## 2018-04-25 NOTE — Assessment & Plan Note (Addendum)
I suspect her wounds are from her circulation. Will ask her to see vascular.  Will continue to watch her off anbx She will also f/u with WOC.  I asked her to f/u with podiatry for her nails.  I will see her back in 1 months

## 2018-04-25 NOTE — Telephone Encounter (Signed)
Called [facility] pt's assisted living facility to speak with charge nurse regarding some concerns about today. Spoke with Raynelle FanningJulie to inform her of an incident at our office and to let her know as well that MD was able to look her over during the visit, and felt like she was okay to head back to facility instead of ED. Also advised RN that pt must wear a diaper when she comes to our office to avoid this situation. RN verbalized understanding and will document this call Lorenso CourierJose L Maldonado, CMA

## 2018-04-25 NOTE — Progress Notes (Signed)
   Subjective:    Patient ID: [name], female    DOB: [birth year], 67 y.o.   MRN: [id number]  HPI 67 y.o. F with hx of foot wounds related to her PVD (femoral stent 10-2016) and neuropathy. She has had long standing ulcers on both feet which have been progressive despite po anbx. She has also had care at wound care center.  I last saw her on 03/21/2018 he noted that she had a R foot ulcer. She was worried about continued wound d/c. She was placed on doxy for 1 month.  On 04-03-18 she was found encephalopathic and covered in urine and feces.  She was brought to the emergency department where she was afebrile.  MRI of her brain suggested possible PRES. she has a history of diabetes, peripheral vascular disease and peripheral neuropathy. She was started on vanco/cefepime, her plain films that did not show osteo. She was noted to have a soft tissue abscess under first MT head. Her anbx were changed to merrem to cover a ESBL E coli UTI. She had MRI that showed no evidence of deep infection.  Her anbx were stopped on 7-15.   She f/u today and feels like her sores are worse. They have been more painful. She has had WOC f/u.   She is currently off anbx.    Review of Systems  Constitutional: Negative for chills and fever.  Gastrointestinal: Negative for diarrhea and nausea.  Genitourinary: Negative for difficulty urinating.  Skin: Positive for wound.  Please see HPI. All other systems reviewed and negative.     Objective:   Physical Exam  Constitutional: She appears well-developed and well-nourished. No distress.  HENT:  Mouth/Throat: No oropharyngeal exudate.  Eyes: Pupils are equal, round, and reactive to light. EOM are normal.  Neck: Normal range of motion. Neck supple.  Cardiovascular: Normal rate, regular rhythm and normal heart sounds.  Pulmonary/Chest: Effort normal and breath sounds normal.  Abdominal: Soft. Bowel sounds are normal. There is no tenderness.  Lymphadenopathy:    She  has no cervical adenopathy.         The lesion on her R 1st proximal toe has clear fluid. There is no pustulence. It is non-tender. There is no surrounding erythema. There is no heat.  The Os on the bottom of her L foot has no d/c.  The os on the side of her R foot has not d/c.       Assessment & Plan:

## 2018-04-25 NOTE — Progress Notes (Signed)
Patient asked to use restroom after visit with Dr. Ninetta LightsHatcher around 10:55 am. Patient informed CMA that she was able to stand up and use the restroom once she was in the bathroom. Patient was escorted to bathroom and was assisted to the toilet when she was unable to support herself to the toilet, and was seated back in wheelchair. Called Rn Angelique BlonderDenise Estridge for assistance to help get patient to the toilet. Lifted pt with RN off wheelchair, however, PT was unable to get on toilet and was assisted to floor. Pt did not hurt herself during the series of events. Notified Dr. Ninetta LightsHatcher that pt was assisted to flood in bathroom after she attempted to use restroom. Was able to lift pt off floor with assistance from Dr. Ninetta LightsHatcher, Alesia Morinravis Poole, CMA, and Angeline SlimAshley Hill, RN, Jennet Maduroenise Estridge, RN. MD was able to look pt over and did not feel like she needed to be seen at ED. PT was sent back to living facility will inform them of today's incident. Pt did not want family to be contacted by office when asked. Lorenso CourierJose L Lazarus [place], [place]

## 2018-04-26 ENCOUNTER — Ambulatory Visit (INDEPENDENT_AMBULATORY_CARE_PROVIDER_SITE_OTHER): Payer: PPO | Admitting: Orthopedic Surgery

## 2018-04-26 ENCOUNTER — Encounter (INDEPENDENT_AMBULATORY_CARE_PROVIDER_SITE_OTHER): Payer: Self-pay | Admitting: Orthopedic Surgery

## 2018-04-26 VITALS — Ht 65.0 in | Wt 200.0 lb

## 2018-04-26 DIAGNOSIS — M869 Osteomyelitis, unspecified: Secondary | ICD-10-CM | POA: Diagnosis not present

## 2018-04-26 DIAGNOSIS — E1142 Type 2 diabetes mellitus with diabetic polyneuropathy: Secondary | ICD-10-CM

## 2018-04-26 DIAGNOSIS — Z6841 Body Mass Index (BMI) 40.0 and over, adult: Secondary | ICD-10-CM

## 2018-04-26 DIAGNOSIS — E43 Unspecified severe protein-calorie malnutrition: Secondary | ICD-10-CM | POA: Diagnosis not present

## 2018-04-26 NOTE — Progress Notes (Signed)
Office Visit Note   Patient: [name]           Date of Birth: Jun 07, 1951           MRN: [id number] Visit Date: 04/26/2018              Requested by: Jarome Matin, MD [address] [place], [place] [address] PCP: Jarome Matin, MD  No chief complaint on file.     HPI: Patient is a 67 year old woman with multiple medical problems.  She is currently in skilled nursing she is total assistance for transfers with Michiel Sites lift she cannot stand on her own.  She is status post a femoral stent in February 2015.  Patient presents with a chronic ulcer beneath the left foot first metatarsal head with chronic osteomyelitis as well as multiple new abrasions on the dorsum of the right foot and a blood blister on the plantar aspect of the right foot.  Assessment & Plan: Visit Diagnoses: No diagnosis found.  Plan: Patient does not have any ischemic ulcers.  There is good bleeding granulation tissue to the ulcers on the right foot.  Good dorsalis pedis pulse to the right foot.  Orders were written for Silvadene changes all of the ulcers this is to be changed daily.  Patient is to wear her  protective boots at all times will follow-up as needed.  Follow-Up Instructions: No follow-ups on file.   Ortho Exam  Patient is alert, oriented, no adenopathy, well-dressed, normal affect, normal respiratory effort. Examination patient has a good dorsalis pedis pulse on the right.  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  Patient also has a new blood blister on the plantar aspect of the right foot  there is no signs of infection the  Imaging: No results found. No images are attached to the encounter.  Labs: Lab Results  Component Value Date   HGBA1C 5.6 06/06/2017   HGBA1C 5.9 (H) 02/16/2012   ESRSEDRATE 63 (H) 01/07/2018   ESRSEDRATE 89 (H) 06/05/2017   CRP 1.1 (H) 01/07/2018   CRP 0.9 01/07/2018   CRP 3.7 (H) 06/05/2017   LABURIC 8.2 (H) 04/21/2015   REPTSTATUS 04/05/2018 FINAL 04/02/2018   GRAMSTAIN  02/21/2018    RARE WBC PRESENT, PREDOMINANTLY PMN RARE GRAM POSITIVE COCCI IN PAIRS    CULT (A) 04/02/2018    >=100,000 COLONIES/mL ESCHERICHIA COLI Confirmed Extended Spectrum Beta-Lactamase Producer (ESBL).  In bloodstream infections from ESBL organisms, carbapenems are preferred over piperacillin/tazobactam. They are shown to have a lower risk of mortality. Performed at [facility], [address] [place], [place] [address]    LABORGA ESCHERICHIA COLI (A) 04/02/2018     Lab Results  Component Value Date   ALBUMIN 3.2 (L) 04/23/2018   ALBUMIN 2.9 (L) 04/02/2018   ALBUMIN 2.5 (L) 01/08/2018   PREALBUMIN 12.6 (L) 06/06/2017   LABURIC 8.2 (H) 04/21/2015    There is no height or weight on file to calculate BMI.  Orders:  No orders of the defined types were placed in  this encounter.  No orders of the defined types were placed in this encounter.    Procedures: No procedures performed  Clinical Data: No additional findings.  ROS:  All other systems negative, except as noted in the HPI. Review of Systems  Objective: Vital Signs: LMP  (LMP Unknown)   Specialty Comments:  No specialty comments available.  PMFS History: Patient Active Problem List   Diagnosis Date Noted  . Dyslipidemia 04/23/2018  . Wound of left leg   . Sepsis (HCC)   . Atrial fibrillation with RVR (HCC)   . Pressure injury of skin 04/03/2018  . PRES (posterior reversible encephalopathy syndrome) 04/02/2018  . Depression   . Hypokalemia   . Anemia   . Neuropathy   .  Osteomyelitis of great toe of left foot (HCC) 01/07/2018  . 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 Morbid obesity (HCC) 06/12/2015  . 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 Periodic limb movement disorder (PLMD) 12/30/2013  . OSA (obstructive sleep apnea) 12/30/2013  . Abnormality of gait 10/02/2013  . Lumbago 10/02/2013  . Essential hypertension 02/17/2012  . S/P total knee arthroplasty, left 02/17/2012   Past Medical History:  Diagnosis Date  . Anxiety   . 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  PT ALSO HAS LOWER BACK PAIN AT TIMES  . Depression   . GERD (gastroesophageal reflux disease)    RARE- OTC ANTACID IF NEEDED--PAST HX OF ULCER  . Headache    wakes up with headache   . Hyperlipidemia   . Hypertension   . Kidney stones    PT PASSED STONES--NO KNOWN STONES AT PRESENT TIMES  . Lumbago 10/02/2013  . Obesity   . OSA (obstructive sleep apnea) 12/30/2013   Very mild OSA with REM accentuation,  tested 11-29-13 at [facility] sleep, Dr Frances Furbish .   Marland Kitchen Periodic limb movement disorder (PLMD) 12/30/2013  . Restless leg syndrome   . Ulcer   . Urinary incontinence     Family History  Problem Relation Age of Onset  . Sleep apnea Father   . Hypertension Father   . Hypertension Mother   . Multiple sclerosis Sister   . Multiple sclerosis Sister     Past Surgical History:  Procedure Laterality Date  . ABDOMINAL AORTOGRAM W/LOWER EXTREMITY N/A 10/10/2017   Procedure: ABDOMINAL AORTOGRAM W/LOWER EXTREMITY;  Surgeon: Nada Libman, MD;  [facility];  Service: Cardiovascular;  Laterality: N/A;  . BILATERAL BUNIONECTOMY 1979    . BREAST SURGERY     biopsy   . COLON SURGERY    . PERIPHERAL VASCULAR INTERVENTION  10/10/2017   Procedure: PERIPHERAL VASCULAR INTERVENTION;  Surgeon: Nada Libman, MD;  Location: [facility];  Service: Cardiovascular;;  lt. Iliac  . SMALL INTESTINE SURGERY    . SURGERY FOR BOWEL OBSTRUCTION AND  REMOVAL OF PART OF STOMACH  IN 2000  2000  . TOTAL KNEE ARTHROPLASTY  02/16/2012   Procedure: TOTAL KNEE ARTHROPLASTY;  Surgeon: Javier Docker, MD;  Location: [facility];  Service: Orthopedics;  Laterality: Left;  . TOTAL KNEE ARTHROPLASTY Right 11/20/2014   Procedure: RIGHT TOTAL KNEE ARTHROPLASTY;  Surgeon: Javier Docker, MD;  Location: [facility];  Service: Orthopedics;  Laterality: Right;   Social History   Occupational History  . Not on file  Tobacco Use  . Smoking status: Former Smoker    Packs/day: 2.00    Years: 20.00    Pack  years: 40.00    Types: Cigarettes    Last attempt to quit: 10/17/1988    Years since quitting: 29.5  . Smokeless tobacco: Never Used  Substance and Sexual Activity  . Alcohol use: No  . Drug use: No    Comment: Marijuana - 25 years ago  . Sexual activity: Not on file

## 2018-05-01 ENCOUNTER — Encounter: Payer: Self-pay | Admitting: Adult Health

## 2018-05-01 ENCOUNTER — Non-Acute Institutional Stay (SKILLED_NURSING_FACILITY): Payer: PPO | Admitting: Adult Health

## 2018-05-01 DIAGNOSIS — E032 Hypothyroidism due to medicaments and other exogenous substances: Secondary | ICD-10-CM | POA: Diagnosis not present

## 2018-05-01 DIAGNOSIS — F330 Major depressive disorder, recurrent, mild: Secondary | ICD-10-CM

## 2018-05-01 DIAGNOSIS — D649 Anemia, unspecified: Secondary | ICD-10-CM

## 2018-05-01 DIAGNOSIS — I4891 Unspecified atrial fibrillation: Secondary | ICD-10-CM

## 2018-05-01 DIAGNOSIS — T462X1A Poisoning by other antidysrhythmic drugs, accidental (unintentional), initial encounter: Secondary | ICD-10-CM

## 2018-05-01 DIAGNOSIS — I10 Essential (primary) hypertension: Secondary | ICD-10-CM

## 2018-05-01 DIAGNOSIS — L97521 Non-pressure chronic ulcer of other part of left foot limited to breakdown of skin: Secondary | ICD-10-CM | POA: Diagnosis not present

## 2018-05-01 DIAGNOSIS — L89322 Pressure ulcer of left buttock, stage 2: Secondary | ICD-10-CM | POA: Diagnosis not present

## 2018-05-01 NOTE — Progress Notes (Signed)
Location:   [facility] Room Number: 102 A Place of Service:  SNF (31)   CODE STATUS: Full Code  Allergies  Allergen Reactions  . Zolpidem Tartrate Other (See Comments)    Delusions, hallucinations  . Amoxicillin Rash and Other (See Comments)    Has patient had a PCN reaction causing immediate rash, facial/tongue/throat swelling, SOB or lightheadedness with hypotension: Yes Has patient had a PCN reaction causing severe rash involving mucus membranes or skin necrosis: No Has patient had a PCN reaction that required hospitalization: No Has patient had a PCN reaction occurring within the last 10 years: Yes If all of the above answers are "NO", then may proceed with Cephalosporin use.     Chief Complaint  Patient presents with  . Medical Management of Chronic Issues    Hypothyroidism; afib; hypertension; depression; anemia. Weekly follow up for the first 30 days post hospitalization     HPI:  She is a 67 year old short term rehab patient of this facility being seen for the management of her chronic illnesses: hypothyroidism; afib; hypertension; depression; anemia. She denies any chest pain; no palpitations; no feelings of sadness present no change in appetite.   Past Medical History:  Diagnosis Date  . Anxiety   . 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  PT ALSO HAS LOWER BACK PAIN AT TIMES  . Depression   . GERD (gastroesophageal reflux disease)    RARE- OTC ANTACID IF NEEDED--PAST HX OF ULCER  . Headache    wakes up with headache   . Hyperlipidemia   . Hypertension   . Kidney stones    PT PASSED STONES--NO KNOWN STONES AT PRESENT TIMES  . Lumbago 10/02/2013  . Obesity   . OSA (obstructive sleep apnea) 12/30/2013   Very mild OSA with REM accentuation,  tested 11-29-13 at [facility] sleep, Dr Rexene Alberts .   Marland Kitchen Periodic limb movement disorder (PLMD) 12/30/2013  . Restless leg syndrome   . Ulcer   . Urinary incontinence     Past Surgical History:    Procedure Laterality Date  . ABDOMINAL AORTOGRAM W/LOWER EXTREMITY N/A 10/10/2017   Procedure: ABDOMINAL AORTOGRAM W/LOWER EXTREMITY;  Surgeon: Serafina Mitchell, MD;  Location: [place] [facility];  Service: Cardiovascular;  Laterality: N/A;  . BILATERAL BUNIONECTOMY 1979    . BREAST SURGERY     biopsy   . COLON SURGERY    . PERIPHERAL VASCULAR INTERVENTION  10/10/2017   Procedure: PERIPHERAL VASCULAR INTERVENTION;  Surgeon: Serafina Mitchell, MD;  Location: [place] [facility];  Service: Cardiovascular;;  lt. Iliac  . SMALL INTESTINE SURGERY    . SURGERY FOR BOWEL OBSTRUCTION AND REMOVAL OF PART OF STOMACH  IN 2000  2000  . TOTAL KNEE ARTHROPLASTY  02/16/2012   Procedure: TOTAL KNEE ARTHROPLASTY;  Surgeon: Johnn Hai, MD;  Location: [facility];  Service: Orthopedics;  Laterality: Left;  . TOTAL KNEE ARTHROPLASTY Right 11/20/2014   Procedure: RIGHT TOTAL KNEE ARTHROPLASTY;  Surgeon: Johnn Hai, MD;  Location: [facility];  Service: Orthopedics;  Laterality: Right;    Social History   Socioeconomic History  . Marital status: Single    Spouse name: Not on file  . Number of children: 0  . Years of education: 22  . Highest education level: Not on file  Occupational History  . Not on file  Social Needs  . Financial resource strain: Not on file  . Food insecurity:  Worry: Not on file    Inability: Not on file  . Transportation needs:    Medical: Not on file    Non-medical: Not on file  Tobacco Use  . Smoking status: Former Smoker    Packs/day: 2.00    Years: 20.00    Pack years: 40.00    Types: Cigarettes    Last attempt to quit: 10/17/1988    Years since quitting: 29.5  . Smokeless tobacco: Never Used  Substance and Sexual Activity  . Alcohol use: No  . Drug use: No    Comment: Marijuana - 25 years ago  . Sexual activity: Not on file  Lifestyle  . Physical activity:    Days per week: Not on file    Minutes per session: Not on file  . Stress: Not on file  Relationships   . Social connections:    Talks on phone: Not on file    Gets together: Not on file    Attends religious service: Not on file    Active member of club or organization: Not on file    Attends meetings of clubs or organizations: Not on file    Relationship status: Not on file  . Intimate partner violence:    Fear of current or ex partner: Not on file    Emotionally abused: Not on file    Physically abused: Not on file    Forced sexual activity: Not on file  Other Topics Concern  . Not on file  Social History Narrative   Patient is single and lives alone.   Patient is disabled.   Patient has a college education.   Patient is right-handed.   Patient drinks two cokes per day.   Family History  Problem Relation Age of Onset  . Sleep apnea Father   . Hypertension Father   . Hypertension Mother   . Multiple sclerosis Sister   . Multiple sclerosis Sister       VITAL SIGNS BP 126/76   Pulse 81   Temp 97.9 F (36.6 C)   Resp 20   Ht _0  (1.651 m)   Wt 207 lb 9.6 oz (94.2 kg)   LMP  (LMP Unknown)   SpO2 96%   BMI 34.55 kg/m   Outpatient Encounter Medications as of 05/01/2018  Medication Sig  . Amino Acids-Protein Hydrolys (FEEDING SUPPLEMENT, PRO-STAT SUGAR FREE 64,) LIQD Take 30 mLs by mouth 3 (three) times daily.  Marland Kitchen amiodarone (PACERONE) 200 MG tablet Take 1 tablet (200 mg total) by mouth daily.  . benztropine (COGENTIN) 0.5 MG tablet Take 1.5 mg by mouth 2 (two) times daily.   Marland Kitchen buPROPion (WELLBUTRIN XL) 150 MG 24 hr tablet Take 150 mg by mouth 2 (two) times daily.   . carvedilol (COREG) 25 MG tablet Take 1 tablet (25 mg total) by mouth 2 (two) times daily with a meal.  . cyanocobalamin (,VITAMIN B-12,) 1000 MCG/ML injection Inject 1,000 mcg into the muscle every 30 (thirty) days.  Marland Kitchen diltiazem (CARDIZEM) 30 MG tablet Take 1 tablet (30 mg total) by mouth every 12 (twelve) hours.  Marland Kitchen ELIQUIS 5 MG TABS tablet Take 5 mg by mouth 2 (two) times daily.  . fluticasone (FLONASE)  50 MCG/ACT nasal spray Place 1 spray into both nostrils daily as needed for allergies.   Marland Kitchen iron polysaccharides (NIFEREX) 150 MG capsule Take 150 mg by mouth daily.  . Magnesium Oxide 400 MG CAPS Take 1 capsule (400 mg total) by mouth daily.  Marland Kitchen  Multiple Vitamins-Minerals (MULTIVITAMIN & MINERAL PO) Take 1 tablet by mouth daily.  . Nutritional Supplements (NUTRITIONAL SUPPLEMENT PO) Regular Diet - Regular Texture, Regular consistency  . rosuvastatin (CRESTOR) 5 MG tablet Take 5 mg by mouth daily.  Marland Kitchen saccharomyces boulardii (FLORASTOR) 250 MG capsule Take 1 capsule (250 mg total) by mouth 2 (two) times daily.  Marland Kitchen vortioxetine HBr (TRINTELLIX) 5 MG TABS tablet Take 5 mg by mouth daily.   No facility-administered encounter medications on file as of 05/01/2018.      SIGNIFICANT DIAGNOSTIC EXAMS  PREVIOUS:   04-02-18: ct of head: 1. Bilateral occipital lobe/PCA territory acute infarcts versusbPRES. RIGHT occipital lobe petechial hemorrhage. 2. Small potentially acute LEFT MCA territory infarct or PRES. [place] LEFT MCA territory infarct, further propagation from prior CT. 3. Moderate parenchymal brain volume loss. Moderate chronic smallbvessel ischemic changes.  04-02-18: MRI of brain: 1. Severe motion degradation. 2. T2 hyperintense signal abnormality involving cortex andbsubcortical white matter in a posterior distribution. Scattered cortical reduced diffusion. Findings are consistent with PRES and atypical for ischemia. No gross acute hemorrhage identified.  04-02-18: chest x-ray: No acute abnormalities.  04-02-18: left foot x-ray: No acute abnormalities. Degenerative changes as described. Chronic soft tissue swelling lateral to the fifth MTP joint.  04-02-18: right foot x-ray: No acute bone abnormality. No radiographic evidence suggestive of osteomyelitis. Soft tissue abscess on the ball of the foot as described.  04-04-18: MRI/MRA of head:  MRI head: 1. Stable distribution of T2 FLAIR signal  abnormality involving subcortical white matter and cortex in the posterior distribution. Diminished diffusion signal abnormality. 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 2. No new acute intracranial abnormality identified. 3. 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 MRA head: 1. Motion degraded study, suboptimal assessment for small aneurysm or stenosis. 2. Patent anterior and posterior intracranial circulation. No large vessel occlusion.  NO NEW EXAMS    LABS REVIEWED: PREVIOUS:  04-02-18: wbc 13.5; hgb 12.9; hct 43.4; mcv 84.8; plt 530; glucose 135; bun 37; creat 1.40; k+ 4.0; na++ 141; ca 9.3; ast 74; albumin 2.9 blood culture: no growth; urine culture:  e-coli;  04-04-18: wbc 8.8; hgb 10.8; hct 35.8; mcv 83.4; plt 340; glucose 109; bun 14; creat 0.96; k+ 3.2 na++ 136; ca 8.6 mag 1.6; phos 2.3 04-08-18: vit B 12: 371; folate 6.1; iron 19; tibc 190; ferritin 32 04-10-18: wbc 7.8; hgb 10.7; hct 35.9; mcv 84.5 plt 384; glucose 106; bun 17; creat 0.85; k+ 4.1; na++ 137; ca 8.6   TODAY:  04-23-18: wbc 6.1; hgb 10.3; hct 33.7; mcv 81; plt 418; glucose 109; bun 37; creat 1.30; k+ 3.9; na++ 139; ca 9.2 alk phos 147; albumin 3.2 tsh 5.530   Review of Systems  Constitutional: Negative for malaise/fatigue.  Respiratory: Negative for cough.   Cardiovascular: Negative for chest pain and leg swelling.  Gastrointestinal: Negative for abdominal pain, constipation and heartburn.  Musculoskeletal: Negative for back pain, joint pain and myalgias.  Skin: Negative.   Neurological: Negative for dizziness.  Psychiatric/Behavioral: The patient is not nervous/anxious.    Physical Exam  Constitutional: She is oriented to person, place, and time. She appears well-developed and well-nourished. No distress.  Neck: No thyromegaly present.  Cardiovascular: Normal rate, normal heart sounds and intact distal  pulses.  Heart rate irregular   Pulmonary/Chest: Effort normal and breath sounds normal. No respiratory distress.  Abdominal: Soft. Bowel sounds are normal. She exhibits no distension.  There is no tenderness.  Musculoskeletal: Normal range of motion. She exhibits no edema.  Lymphadenopathy:    She has no cervical adenopathy.  Neurological: She is alert and oriented to person, place, and time.  Skin: Skin is warm and dry. She is not diaphoretic.  Psychiatric: She has a normal mood and affect.     ASSESSMENT/ PLAN:  TODAY:   1. Atrial fibrillation with RVR: heart rate is stable will continue coreg 25 mg twice daily and cardizem 30 mg twice daily  amiodarone 200 mg twice daily for rate control will continue eliquis 5 mg twice daily   2. Essential hypertension: is stable b/p 126/76: will continue coreg 25 mg twice daily cardizem 30 mg twice daily  3.  Anemia: is stable hgb 10.7; will continue niferex 150 mg daily; vit B 12 injection monthly  4. Mild episode of recurrent major depressive disorder: is stable will continue wellbturin xr 150 mg twice daily trintellix 5 mg daily  5. Hypothyroidism due to amiodarone: is without change: tsh 5.530; will begin synthroid 25 mcg daily and will check labs in 4 weeks.   PREVIOUS   6. Periodic limb movement disorder (PLMD): is stable will continue cogentin 1.5 mg twice daily   7. Dyslipidemia: is stable will continue crestor 5 mg daily   8. PRES (posterior reversible encephalopathy syndrome) is stable will continue therapy as directed to improve upon her level of independence  9. Hypomagnesemia: is stable will continue magox daily   MD is aware of resident's narcotic use and is in agreement with current plan of care. We will attempt to wean resident as apropriate   Ok Edwards NP [facility]  Contact [phone] Monday through Friday 8am- 5pm  After hours call [phone]

## 2018-05-02 ENCOUNTER — Other Ambulatory Visit: Payer: Self-pay | Admitting: *Deleted

## 2018-05-02 NOTE — Patient Outreach (Signed)
[facility]  05/02/2018  Yvette ShutterCarolyn I Orozco [birth year] [id number]  CSW was able to [facility] with [facility] [place] where patient currently resides to receive short-term rehabilitative services.  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  Patient also reports having a blood blister on the plantar of her right foot, making it very difficult for patient to ambulate.  Patient reports that she is continuing to work with therapies, both physical and occupational, despite it being somewhat difficult for her, as well as painful.   Patient does not have a tentative discharge date scheduled at this time, but is hopeful to be able to return home to live independently within the next few weeks.  Patient reports having a friend, Ermalene SearingDeborah Hodges that comes to her home to check in with her periodically.  Patient is agreeable to home health services being arranged for her at the time of discharge.  Patient may also benefit from a referral to [employer], as patient will not be able to stand for long periods of time to prepare her own meals.   CSW agreed to follow-up with patient again next week to assess progress, as well as to assist with discharge planning needs and services. CSW will also place an order for patient to receive an RNCM, also with [facility], to provide disease management services to patient once she is discharged back into the community.  Patient has been encouraged to contact CSW directly if a discharge date is scheduled so that CSW can make arrangements to attend patient's Discharge Planning Meeting at the facility. Danford BadJoanna Larae Caison, BSW, MSW, LCSW  Licensed Restaurant manager, fast foodClinical Social Worker  [facility] [place]  Mailing [address]Address-[address], [place], [place] [address] Physical Address-[address], [place], [place] [address] Toll Free Main # [phone] Fax # [phone] Cell # [phone]  Office # [phone] [email or web address]

## 2018-05-04 ENCOUNTER — Other Ambulatory Visit: Payer: Self-pay | Admitting: *Deleted

## 2018-05-04 NOTE — Patient Outreach (Signed)
[facility]  05/04/2018  Yvette ShutterCarolyn I Orozco 07-27-1951 [id number]   Erroneous Encounter.  Danford BadJoanna Saporito, BSW, MSW, LCSW  Licensed Restaurant manager, fast foodClinical Social Worker  [facility] [place] [facility]  [address] [place], [place], [place] [address] Physical Address-[address], [place], [place] [address] Toll Free Main # [phone] Fax # [phone] Cell # [phone]  Office # [phone] [email or web address]

## 2018-05-08 ENCOUNTER — Non-Acute Institutional Stay (SKILLED_NURSING_FACILITY): Payer: PPO | Admitting: Adult Health

## 2018-05-08 ENCOUNTER — Encounter: Payer: Self-pay | Admitting: Adult Health

## 2018-05-08 DIAGNOSIS — E785 Hyperlipidemia, unspecified: Secondary | ICD-10-CM | POA: Diagnosis not present

## 2018-05-08 DIAGNOSIS — G4761 Periodic limb movement disorder: Secondary | ICD-10-CM

## 2018-05-08 DIAGNOSIS — I6783 Posterior reversible encephalopathy syndrome: Secondary | ICD-10-CM | POA: Diagnosis not present

## 2018-05-08 DIAGNOSIS — L97521 Non-pressure chronic ulcer of other part of left foot limited to breakdown of skin: Secondary | ICD-10-CM | POA: Diagnosis not present

## 2018-05-08 DIAGNOSIS — L89322 Pressure ulcer of left buttock, stage 2: Secondary | ICD-10-CM | POA: Diagnosis not present

## 2018-05-08 NOTE — Progress Notes (Signed)
Location:   [facility] Room Number: 102 A Place of Service:  SNF (31)   CODE STATUS: Full Code  Allergies  Allergen Reactions  . Zolpidem Tartrate Other (See Comments)    Delusions, hallucinations  . Amoxicillin Rash and Other (See Comments)    Has patient had a PCN reaction causing immediate rash, facial/tongue/throat swelling, SOB or lightheadedness with hypotension: Yes Has patient had a PCN reaction causing severe rash involving mucus membranes or skin necrosis: No Has patient had a PCN reaction that required hospitalization: No Has patient had a PCN reaction occurring within the last 10 years: Yes If all of the above answers are "NO", then may proceed with Cephalosporin use.     Chief Complaint  Patient presents with  . Medical Management of Chronic Issues    Dyslipidemia; pres; plmd; hypomagnesemia: weekly follow up for the first 30 days post hospitalization     HPI:  She is a 67 year old short term rehab patient being seen for the management of her chronic illnesses: dyslipidemia; PRES; PLD; hypomagnesemia. She denies any uncontrolled pain; no anxiety; no changes in appetite. There are no nursing concerns at this time    Past Medical History:  Diagnosis Date  . Anxiety   . 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  PT ALSO HAS LOWER BACK PAIN AT TIMES  . Depression   . GERD (gastroesophageal reflux disease)    RARE- OTC ANTACID IF NEEDED--PAST HX OF ULCER  . Headache    wakes up with headache   . Hyperlipidemia   . Hypertension   . Kidney stones    PT PASSED STONES--NO KNOWN STONES AT PRESENT TIMES  . Lumbago 10/02/2013  . Obesity   . OSA (obstructive sleep apnea) 12/30/2013   Very mild OSA with REM accentuation,  tested 11-29-13 at [facility] sleep, Dr Rexene Alberts .   Marland Kitchen Periodic limb movement disorder (PLMD) 12/30/2013  . Restless leg syndrome   . Ulcer   . Urinary incontinence     Past Surgical History:  Procedure Laterality Date  .  ABDOMINAL AORTOGRAM W/LOWER EXTREMITY N/A 10/10/2017   Procedure: ABDOMINAL AORTOGRAM W/LOWER EXTREMITY;  Surgeon: Serafina Mitchell, MD;  Location: [place] CV LAB;  Service: Cardiovascular;  Laterality: N/A;  . BILATERAL BUNIONECTOMY 1979    . BREAST SURGERY     biopsy   . COLON SURGERY    . PERIPHERAL VASCULAR INTERVENTION  10/10/2017   Procedure: PERIPHERAL VASCULAR INTERVENTION;  Surgeon: Serafina Mitchell, MD;  Location: [place] CV LAB;  Service: Cardiovascular;;  lt. Iliac  . SMALL INTESTINE SURGERY    . SURGERY FOR BOWEL OBSTRUCTION AND REMOVAL OF PART OF STOMACH  IN 2000  2000  . TOTAL KNEE ARTHROPLASTY  02/16/2012   Procedure: TOTAL KNEE ARTHROPLASTY;  Surgeon: Johnn Hai, MD;  [facility];  Service: Orthopedics;  Laterality: Left;  . TOTAL KNEE ARTHROPLASTY Right 11/20/2014   Procedure: RIGHT TOTAL KNEE ARTHROPLASTY;  Surgeon: Johnn Hai, MD;  [facility];  Service: Orthopedics;  Laterality: Right;    Social History   Socioeconomic History  . Marital status: Single    Spouse name: Not on file  . Number of children: 0  . Years of education: 43  . Highest education level: Not on file  Occupational History  . Not on file  Social Needs  . Financial resource strain: Not on file  . Food insecurity:    Worry: Not  on file    Inability: Not on file  . Transportation needs:    Medical: Not on file    Non-medical: Not on file  Tobacco Use  . Smoking status: Former Smoker    Packs/day: 2.00    Years: 20.00    Pack years: 40.00    Types: Cigarettes    Last attempt to quit: 10/17/1988    Years since quitting: 29.5  . Smokeless tobacco: Never Used  Substance and Sexual Activity  . Alcohol use: No  . Drug use: No    Comment: Marijuana - 25 years ago  . Sexual activity: Not on file  Lifestyle  . Physical activity:    Days per week: Not on file    Minutes per session: Not on file  . Stress: Not on file  Relationships  . Social connections:    Talks  on phone: Not on file    Gets together: Not on file    Attends religious service: Not on file    Active member of club or organization: Not on file    Attends meetings of clubs or organizations: Not on file    Relationship status: Not on file  . Intimate partner violence:    Fear of current or ex partner: Not on file    Emotionally abused: Not on file    Physically abused: Not on file    Forced sexual activity: Not on file  Other Topics Concern  . Not on file  Social History Narrative   Patient is single and lives alone.   Patient is disabled.   Patient has a college education.   Patient is right-handed.   Patient drinks two cokes per day.   Family History  Problem Relation Age of Onset  . Sleep apnea Father   . Hypertension Father   . Hypertension Mother   . Multiple sclerosis Sister   . Multiple sclerosis Sister       VITAL SIGNS BP (!) 142/86   Pulse 96   Temp 97.7 F (36.5 C)   Resp 20   Ht 5' 5"  (1.651 m)   Wt 206 lb (93.4 kg)   LMP  (LMP Unknown)   SpO2 97%   BMI 34.28 kg/m   Outpatient Encounter Medications as of 05/08/2018  Medication Sig  . Amino Acids-Protein Hydrolys (FEEDING SUPPLEMENT, PRO-STAT SUGAR FREE 64,) LIQD Take 30 mLs by mouth 3 (three) times daily.  Marland Kitchen amiodarone (PACERONE) 200 MG tablet Take 1 tablet (200 mg total) by mouth daily.  . benztropine (COGENTIN) 0.5 MG tablet Take 1.5 mg by mouth 2 (two) times daily.   Marland Kitchen buPROPion (WELLBUTRIN XL) 150 MG 24 hr tablet Take 150 mg by mouth 2 (two) times daily.   . carvedilol (COREG) 25 MG tablet Take 1 tablet (25 mg total) by mouth 2 (two) times daily with a meal.  . cyanocobalamin (,VITAMIN B-12,) 1000 MCG/ML injection Inject 1,000 mcg into the muscle every 30 (thirty) days.  Marland Kitchen diltiazem (CARDIZEM) 30 MG tablet Take 1 tablet (30 mg total) by mouth every 12 (twelve) hours.  Marland Kitchen ELIQUIS 5 MG TABS tablet Take 5 mg by mouth 2 (two) times daily.  . fluticasone (FLONASE) 50 MCG/ACT nasal spray Place 1 spray  into both nostrils daily as needed for allergies.   Marland Kitchen iron polysaccharides (NIFEREX) 150 MG capsule Take 150 mg by mouth daily.  Marland Kitchen levothyroxine (SYNTHROID, LEVOTHROID) 25 MCG tablet Take 25 mcg by mouth daily before breakfast.  . Magnesium Oxide 400  MG CAPS Take 1 capsule (400 mg total) by mouth daily.  . Multiple Vitamins-Minerals (MULTIVITAMIN & MINERAL PO) Take 1 tablet by mouth daily.  . Nutritional Supplements (NUTRITIONAL SUPPLEMENT PO) Regular Diet - Regular Texture, Regular consistency  . rosuvastatin (CRESTOR) 5 MG tablet Take 5 mg by mouth daily.  Marland Kitchen saccharomyces boulardii (FLORASTOR) 250 MG capsule Take 1 capsule (250 mg total) by mouth 2 (two) times daily.  Marland Kitchen vortioxetine HBr (TRINTELLIX) 5 MG TABS tablet Take 5 mg by mouth daily.   No facility-administered encounter medications on file as of 05/08/2018.      SIGNIFICANT DIAGNOSTIC EXAMS  PREVIOUS:   04-02-18: ct of head: 1. Bilateral occipital lobe/PCA territory acute infarcts versusbPRES. RIGHT occipital lobe petechial hemorrhage. 2. Small potentially acute LEFT MCA territory infarct or PRES. [place] LEFT MCA territory infarct, further propagation from prior CT. 3. Moderate parenchymal brain volume loss. Moderate chronic smallbvessel ischemic changes.  04-02-18: MRI of brain: 1. Severe motion degradation. 2. T2 hyperintense signal abnormality involving cortex andbsubcortical white matter in a posterior distribution. Scattered cortical reduced diffusion. Findings are consistent with PRES and atypical for ischemia. No gross acute hemorrhage identified.  04-02-18: chest x-ray: No acute abnormalities.  04-02-18: left foot x-ray: No acute abnormalities. Degenerative changes as described. Chronic soft tissue swelling lateral to the fifth MTP joint.  04-02-18: right foot x-ray: No acute bone abnormality. No radiographic evidence suggestive of osteomyelitis. Soft tissue abscess on the ball of the foot as described.  04-04-18: MRI/MRA of  head:  MRI head: 1. Stable distribution of T2 FLAIR signal abnormality involving subcortical white matter and cortex in the posterior distribution. Diminished diffusion signal abnormality. 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 2. No new acute intracranial abnormality identified. 3. 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 MRA head: 1. Motion degraded study, suboptimal assessment for small aneurysm or stenosis. 2. Patent anterior and posterior intracranial circulation. No large vessel occlusion.  NO NEW EXAMS    LABS REVIEWED: PREVIOUS:  04-02-18: wbc 13.5; hgb 12.9; hct 43.4; mcv 84.8; plt 530; glucose 135; bun 37; creat 1.40; k+ 4.0; na++ 141; ca 9.3; ast 74; albumin 2.9 blood culture: no growth; urine culture:  e-coli;  04-04-18: wbc 8.8; hgb 10.8; hct 35.8; mcv 83.4; plt 340; glucose 109; bun 14; creat 0.96; k+ 3.2 na++ 136; ca 8.6 mag 1.6; phos 2.3 04-08-18: vit B 12: 371; folate 6.1; iron 19; tibc 190; ferritin 32 04-10-18: wbc 7.8; hgb 10.7; hct 35.9; mcv 84.5 plt 384; glucose 106; bun 17; creat 0.85; k+ 4.1; na++ 137; ca 8.6   04-23-18: wbc 6.1; hgb 10.3; hct 33.7; mcv 81; plt 418; glucose 109; bun 37; creat 1.30; k+ 3.9; na++ 139; ca 9.2 alk phos 147; albumin 3.2 tsh 5.530   NO NEW LABS.    Review of Systems  Constitutional: Negative for malaise/fatigue.  Respiratory: Negative for cough and shortness of breath.   Cardiovascular: Negative for chest pain, palpitations and leg swelling.  Gastrointestinal: Negative for abdominal pain, constipation and heartburn.  Musculoskeletal: Negative for back pain, joint pain and myalgias.  Skin: Negative.   Neurological: Negative for dizziness.  Psychiatric/Behavioral: The patient is not nervous/anxious.      Physical Exam  Constitutional: She appears well-developed and well-nourished. No distress.  Neck: No thyromegaly present.    Cardiovascular: Normal rate, normal heart sounds and intact distal pulses.  Heart rate irregular   Pulmonary/Chest: Effort normal and  breath sounds normal. No respiratory distress.  Abdominal: Soft. Bowel sounds are normal. She exhibits no distension. There is no tenderness.  Musculoskeletal: Normal range of motion. She exhibits no edema.  Lymphadenopathy:    She has no cervical adenopathy.  Neurological: She is alert.  Skin: Skin is warm and dry. She is not diaphoretic.  Left buttock resolved Left great toe: stage II 5 x 5 mm Left second toe: II 4 x 4 mm Right great toe: 5 x 5 mm Left lateral foot: stage II: 5 x 5 mm Left heel: stage II 5 x 5 mm Right great toe bunion stage II; 7 x 7 mm  Psychiatric: She has a normal mood and affect.    ASSESSMENT/ PLAN:  TODAY:   1. Periodic limb movement disorder (PLMD): is stable will continue cogentin 1.5 mg twice daily   2. Dyslipidemia: is stable will continue crestor 5 mg daily   3. PRES (posterior reversible encephalopathy syndrome) is stable will monitor   4. Hypomagnesemia: is stable will continue magox daily   PREVIOUS   5. Atrial fibrillation with RVR: heart rate is stable will continue coreg 25 mg twice daily and cardizem 30 mg twice daily  amiodarone 200 mg twice daily for rate control will continue eliquis 5 mg twice daily   6. Essential hypertension: is stable b/p 142/86: will continue coreg 25 mg twice daily cardizem 30 mg twice daily  7.  Anemia: is stable hgb 10.7; will continue niferex 150 mg daily; vit B 12 injection monthly  8. Mild episode of recurrent major depressive disorder: is stable will continue wellbturin xr 150 mg twice daily trintellix 5 mg daily  9. Hypothyroidism due to amiodarone: is without change: tsh 5.530; will continue  synthroid 25 mcg daily    MD is aware of resident's narcotic use and is in agreement with current plan of care. We will attempt to wean resident as apropriate   Ok Edwards  NP [facility]  Contact [phone] Monday through Friday 8am- 5pm  After hours call [phone]

## 2018-05-15 DIAGNOSIS — L97521 Non-pressure chronic ulcer of other part of left foot limited to breakdown of skin: Secondary | ICD-10-CM | POA: Diagnosis not present

## 2018-05-15 DIAGNOSIS — L89322 Pressure ulcer of left buttock, stage 2: Secondary | ICD-10-CM | POA: Diagnosis not present

## 2018-05-16 ENCOUNTER — Other Ambulatory Visit: Payer: Self-pay | Admitting: *Deleted

## 2018-05-16 NOTE — Patient Outreach (Signed)
[facility]) Care Management  05/16/2018  [name] [birth year] [id number]   CSW was able to meet with [facility] at [facility] [place] today, Skilled Nursing Facility where patient currently resides to receive short-term rehabilitative services, to perform a routine visit.  CSW noted, as patient was sitting up in her bed, that patient has visual swelling in both lower extremities, especially her feet.  Patient was encouraged to communicate any symptoms and/or concerns with [facility] attending [facility], as this may affect patient's ability to be able to work with therapies (both physical and occupational). Patient reported that a recent MRI (Magnetic Resonance Imaging) of her brain showed Severe Motion Degradation.  Patient is slow to work with therapies and is still unable to perform activities of daily living independently.  Patient's plan is still to return home to live independently at time of release [facility].  During CSW's next routine visit with patient, scheduled for Wednesday, May 23, 2018 at 9:00AM, CSW will address long-term care placement needs with patient, as well as assess and assist with discharge planning needs and services. Danford BadJoanna Saporito, BSW, MSW, LCSW  Licensed Restaurant manager, fast foodClinical Social Worker  [facility] Care Management [facility]  [place], [place], [place] [address] Physical Address-[address], [place], [place] [address] Toll Free Main # [phone] Fax # [phone] Cell # [phone]  Office # [phone] Mardene [email or web address]

## 2018-05-21 ENCOUNTER — Ambulatory Visit (INDEPENDENT_AMBULATORY_CARE_PROVIDER_SITE_OTHER): Payer: PPO | Admitting: Internal Medicine

## 2018-05-21 ENCOUNTER — Encounter: Payer: Self-pay | Admitting: Internal Medicine

## 2018-05-21 VITALS — BP 90/62 | HR 107 | Ht 65.0 in | Wt 206.0 lb

## 2018-05-21 DIAGNOSIS — R0600 Dyspnea, unspecified: Secondary | ICD-10-CM | POA: Diagnosis not present

## 2018-05-21 DIAGNOSIS — I480 Paroxysmal atrial fibrillation: Secondary | ICD-10-CM

## 2018-05-21 DIAGNOSIS — I10 Essential (primary) hypertension: Secondary | ICD-10-CM

## 2018-05-21 NOTE — Patient Instructions (Addendum)
Medication Instructions:  Your physician has recommended you make the following change in your medication:  1.  Stop taking diltiazem  Labwork: You will get blood work today:  TSH  Testing/Procedures: None ordered.  Follow-Up: Your physician wants you to follow-up in: one year with Dr. Ladona Ridgelaylor.     You will receive a reminder letter in the mail two months in advance. If you don't receive a letter, please call our office to schedule the follow-up appointment.  Any Other Special Instructions Will Be Listed Below (If Applicable).  If you need a refill on your cardiac medications before your next appointment, please call your pharmacy.

## 2018-05-21 NOTE — Progress Notes (Signed)
HPI [name] returns today for followup. She has developed persistent atrial fib after dofetilide was ineffective. She has been placed on amio for rate control along eith her coreg. Her pressures are running a little low. She has become increasingly sedentary. She is in a wheel chair with protective boots. She has had osteomyelitis in her toes.  Allergies  Allergen Reactions  . Zolpidem Tartrate Other (See Comments)    Delusions, hallucinations  . Amoxicillin Rash and Other (See Comments)    Has patient had a PCN reaction causing immediate rash, facial/tongue/throat swelling, SOB or lightheadedness with hypotension: Yes Has patient had a PCN reaction causing severe rash involving mucus membranes or skin necrosis: No Has patient had a PCN reaction that required hospitalization: No Has patient had a PCN reaction occurring within the last 10 years: Yes If all of the above answers are "NO", then may proceed with Cephalosporin use.      Current Outpatient Medications  Medication Sig Dispense Refill  . Amino Acids-Protein Hydrolys (FEEDING SUPPLEMENT, PRO-STAT SUGAR FREE 64,) LIQD Take 30 mLs by mouth 3 (three) times daily. 900 mL 0  . amiodarone (PACERONE) 200 MG tablet Take 1 tablet (200 mg total) by mouth daily. 30 tablet 6  . benztropine (COGENTIN) 0.5 MG tablet Take 1.5 mg by mouth 2 (two) times daily.     Marland Kitchen. buPROPion (WELLBUTRIN XL) 150 MG 24 hr tablet Take 150 mg by mouth 2 (two) times daily.     . carvedilol (COREG) 25 MG tablet Take 1 tablet (25 mg total) by mouth 2 (two) times daily with a meal. 60 tablet 0  . cyanocobalamin (,VITAMIN B-12,) 1000 MCG/ML injection Inject 1,000 mcg into the muscle every 30 (thirty) days.    Marland Kitchen. ELIQUIS 5 MG TABS tablet Take 5 mg by mouth 2 (two) times daily.    . fluticasone (FLONASE) 50 MCG/ACT nasal spray Place 1 spray into both nostrils daily as needed for allergies.     Marland Kitchen. iron polysaccharides (NIFEREX) 150 MG capsule Take 150 mg by mouth  daily.    Marland Kitchen. levothyroxine (SYNTHROID, LEVOTHROID) 25 MCG tablet Take 25 mcg by mouth daily before breakfast.    . Magnesium Oxide 400 MG CAPS Take 1 capsule (400 mg total) by mouth daily. 90 capsule 3  . rosuvastatin (CRESTOR) 5 MG tablet Take 5 mg by mouth daily.    Marland Kitchen. saccharomyces boulardii (FLORASTOR) 250 MG capsule Take 1 capsule (250 mg total) by mouth 2 (two) times daily.    Marland Kitchen. vortioxetine HBr (TRINTELLIX) 5 MG TABS tablet Take 5 mg by mouth daily.     No current facility-administered medications for this visit.      Past Medical History:  Diagnosis Date  . Anxiety   . 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  PT ALSO HAS LOWER BACK PAIN AT TIMES  . Depression   . GERD (gastroesophageal reflux disease)    RARE- OTC ANTACID IF NEEDED--PAST HX OF ULCER  . Headache    wakes up with headache   . Hyperlipidemia   . Hypertension   . Kidney stones    PT PASSED STONES--NO KNOWN STONES AT PRESENT TIMES  . Lumbago 10/02/2013  . Obesity   . OSA (obstructive sleep apnea) 12/30/2013   Very mild OSA with REM accentuation,  [facility]-13 [facility], Dr Frances FurbishAthar .   Marland Kitchen. Periodic limb movement disorder (PLMD) 12/30/2013  . Restless leg syndrome   .  Ulcer   . Urinary incontinence     ROS:   All systems reviewed and negative except as noted in the HPI.   Past Surgical History:  Procedure Laterality Date  . ABDOMINAL AORTOGRAM W/LOWER EXTREMITY N/A 10/10/2017   Procedure: ABDOMINAL AORTOGRAM W/LOWER EXTREMITY;  Surgeon: Nada Libman, MD;  [facility];  Service: Cardiovascular;  Laterality: N/A;  . BILATERAL BUNIONECTOMY 1979    . BREAST SURGERY     biopsy   . COLON SURGERY    . PERIPHERAL VASCULAR INTERVENTION  10/10/2017   Procedure: PERIPHERAL VASCULAR INTERVENTION;  Surgeon: Nada Libman, MD;  Location: [facility];  Service: Cardiovascular;;  lt. Iliac  . SMALL INTESTINE SURGERY    . SURGERY FOR BOWEL OBSTRUCTION AND REMOVAL OF PART OF  STOMACH  IN 2000  2000  . TOTAL KNEE ARTHROPLASTY  02/16/2012   Procedure: TOTAL KNEE ARTHROPLASTY;  Surgeon: Javier Docker, MD;  [facility];  Service: Orthopedics;  Laterality: Left;  . TOTAL KNEE ARTHROPLASTY Right 11/20/2014   Procedure: RIGHT TOTAL KNEE ARTHROPLASTY;  Surgeon: Javier Docker, MD;  Location: [facility];  Service: Orthopedics;  Laterality: Right;     Family History  Problem Relation Age of Onset  . Sleep apnea Father   . Hypertension Father   . Hypertension Mother   . Multiple sclerosis Sister   . Multiple sclerosis Sister      Social History   Socioeconomic History  . Marital status: Single    Spouse name: Not on file  . Number of children: 0  . Years of education: 69  . Highest education level: Not on file  Occupational History  . Not on file  Social Needs  . Financial resource strain: Not on file  . Food insecurity:    Worry: Not on file    Inability: Not on file  . Transportation needs:    Medical: Not on file    Non-medical: Not on file  Tobacco Use  . Smoking status: Former Smoker    Packs/day: 2.00    Years: 20.00    Pack years: 40.00    Types: Cigarettes    Last attempt to quit: 10/17/1988    Years since quitting: 29.6  . Smokeless tobacco: Never Used  Substance and Sexual Activity  . Alcohol use: No  . Drug use: No    Comment: Marijuana - 25 years ago  . Sexual activity: Not on file  Lifestyle  . Physical activity:    Days per week: Not on file    Minutes per session: Not on file  . Stress: Not on file  Relationships  . Social connections:    Talks on phone: Not on file    Gets together: Not on file    Attends religious service: Not on file    Active member of club or organization: Not on file    Attends meetings of clubs or organizations: Not on file    Relationship status: Not on file  . Intimate partner violence:    Fear of current or ex partner: Not on file    Emotionally abused: Not on file    Physically abused: Not  on file    Forced sexual activity: Not on file  Other Topics Concern  . Not on file  Social History Narrative   Patient is single and lives alone.   Patient is disabled.   Patient has a college education.   Patient is right-handed.  Patient drinks two cokes per day.     BP 90/62   Pulse (!) 107   Ht 5\' 5"  (1.651 m)   Wt 206 lb (93.4 kg)   LMP  (LMP Unknown)   SpO2 98%   BMI 34.28 kg/m   Physical Exam:  Chronically ill appearing 67 yo woman, NAD HEENT: Unremarkable Neck:  6 cm JVD, no thyromegally Lymphatics:  No adenopathy Back:  No CVA tenderness Lungs:  Clear with no wheezes HEART:  Regular rate rhythm, no murmurs, no rubs, no clicks Abd:  soft, positive bowel sounds, no organomegally, no rebound, no guarding Ext:  2 plus pulses, no edema, no cyanosis, no clubbing Skin:  No rashes no nodules Neuro:  CN II through XII intact, motor grossly intact  EKG - atrial fib with a controlled VR   Assess/Plan: 1. Atrial fib - her ventricular rate is reasonably well controlled particularly in light of her sedentary lifestyle.  2. HTN - her blood pressure is now low. I have asked her to stop the diltiazem. 3. Osteomyelitis - she is under the treatment of Dr. Lajoyce Corners.   Leonia [facility].D.

## 2018-05-21 NOTE — Addendum Note (Signed)
Addended by: Almira BarBOONE, Guadalupe Kerekes R on: 05/21/2018 04:00 PM   Modules accepted: Orders

## 2018-05-22 DIAGNOSIS — L89322 Pressure ulcer of left buttock, stage 2: Secondary | ICD-10-CM | POA: Diagnosis not present

## 2018-05-22 DIAGNOSIS — L97521 Non-pressure chronic ulcer of other part of left foot limited to breakdown of skin: Secondary | ICD-10-CM | POA: Diagnosis not present

## 2018-05-22 LAB — TSH: TSH: 7.1 u[IU]/mL — ABNORMAL HIGH (ref 0.450–4.500)

## 2018-05-23 ENCOUNTER — Other Ambulatory Visit: Payer: Self-pay | Admitting: *Deleted

## 2018-05-23 ENCOUNTER — Encounter: Payer: Self-pay | Admitting: *Deleted

## 2018-05-23 NOTE — Patient Outreach (Signed)
[facility]) Care Management  05/23/2018  [name] 1950/11/19 [id number]  CSW was able to meet with patient at [facility] [place] today, [place] where patient currently resides to receive short-term rehabilitative services.  Patient reported that the plan is for her to stay at [facility] for long-term care services.  Patient went on to say that the plan is for her to be moved to a [place] Medicaid bed once her Long-Term Care Medicaid is approved through the [place].  CSW was able to confirm all of the above information with [name], Social Work Librarian, academic at [employer] at [place].  Mrs. [name] admitted that she was able to assist patient and family with submission of the Long-Term Care Medicaid application.  Mrs. [name] reported that there is currently a female [place] Medicaid bed available, so as soon as patient is approved, patient will be moved down the hall to a Long-Term Care Medicaid bed.  CSW will perform a case closure on patient, as all goals of treatment have been met from social work standpoint and no additional social work needs have been identified at this time.  CSW will notify patient's Geriatric Nurse Practitioner with [facility], [name] of CSW's plans to close patient's case.  CSW will fax an update to patient's Primary Care Physician, Dr. [name] to ensure that they are aware of CSW's involvement with patient's plan of care.    Nat Christen, BSW, MSW, LCSW  Licensed Education officer, [facility]  Mailing [place] [address], [place], [place] [address] Physical Address-[address], [place], [place] [address] Toll Free Main # [phone] Fax # [phone] Cell # [phone]  Office #  [phone] [email or web address]

## 2018-05-24 ENCOUNTER — Non-Acute Institutional Stay (SKILLED_NURSING_FACILITY): Payer: PPO | Admitting: Adult Health

## 2018-05-24 ENCOUNTER — Encounter: Payer: Self-pay | Admitting: Adult Health

## 2018-05-24 DIAGNOSIS — I10 Essential (primary) hypertension: Secondary | ICD-10-CM

## 2018-05-24 DIAGNOSIS — F330 Major depressive disorder, recurrent, mild: Secondary | ICD-10-CM | POA: Diagnosis not present

## 2018-05-24 DIAGNOSIS — T462X1A Poisoning by other antidysrhythmic drugs, accidental (unintentional), initial encounter: Secondary | ICD-10-CM

## 2018-05-24 DIAGNOSIS — E032 Hypothyroidism due to medicaments and other exogenous substances: Secondary | ICD-10-CM

## 2018-05-24 DIAGNOSIS — R7989 Other specified abnormal findings of blood chemistry: Secondary | ICD-10-CM | POA: Diagnosis not present

## 2018-05-24 DIAGNOSIS — D649 Anemia, unspecified: Secondary | ICD-10-CM

## 2018-05-24 LAB — CBC AND DIFFERENTIAL
HCT: 43 (ref 36–46)
HEMOGLOBIN: 13 (ref 12.0–16.0)
Neutrophils Absolute: 3
Platelets: 315 (ref 150–399)
WBC: 5.4

## 2018-05-24 LAB — BASIC METABOLIC PANEL
BUN: 19 (ref 4–21)
CREATININE: 1.1 (ref 0.5–1.1)
Glucose: 85
Potassium: 4.1 (ref 3.4–5.3)
SODIUM: 142 (ref 137–147)

## 2018-05-24 NOTE — Progress Notes (Signed)
Location:   [facility] Room Number: 102 A Place of Service:  SNF (31)   CODE STATUS: Full code  Allergies  Allergen Reactions  . Zolpidem Tartrate Other (See Comments)    Delusions, hallucinations  . Amoxicillin Rash and Other (See Comments)    Has patient had a PCN reaction causing immediate rash, facial/tongue/throat swelling, SOB or lightheadedness with hypotension: Yes Has patient had a PCN reaction causing severe rash involving mucus membranes or skin necrosis: No Has patient had a PCN reaction that required hospitalization: No Has patient had a PCN reaction occurring within the last 10 years: Yes If all of the above answers are "NO", then may proceed with Cephalosporin use.     Chief Complaint  Patient presents with  . Medical Management of Chronic Issues    Hypertension; hypothyroidism; anemia; depression     HPI:  She is a 67 year old long term resident of this facility being seen for the management of her chronic illnesses; hypertension; hypothyroidism; anemia; depression. She denies any uncontrolled pain; no anxiety; no insomnia. Nursing staff reports that her daughter is concerned about the level of her confusion. The nursing staff reports that there is no notable changes in her cognition. Will get labs to look for infection and renal function.   Past Medical History:  Diagnosis Date  . Anxiety   . 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  PT ALSO HAS LOWER BACK PAIN AT TIMES  . Depression   . GERD (gastroesophageal reflux disease)    RARE- OTC ANTACID IF NEEDED--PAST HX OF ULCER  . Headache    wakes up with headache   . Hyperlipidemia   . Hypertension   . Kidney stones    PT PASSED STONES--NO KNOWN STONES AT PRESENT TIMES  . Lumbago 10/02/2013  . Obesity   . OSA (obstructive sleep apnea) 12/30/2013   Very mild OSA with REM accentuation,  tested 11-29-13 at [facility] sleep, Dr Rexene Alberts .   Marland Kitchen Periodic limb movement disorder (PLMD) 12/30/2013    . Restless leg syndrome   . Ulcer   . Urinary incontinence     Past Surgical History:  Procedure Laterality Date  . ABDOMINAL AORTOGRAM W/LOWER EXTREMITY N/A 10/10/2017   Procedure: ABDOMINAL AORTOGRAM W/LOWER EXTREMITY;  Surgeon: Serafina Mitchell, MD;  Location: [facility];  Service: Cardiovascular;  Laterality: N/A;  . BILATERAL BUNIONECTOMY 1979    . BREAST SURGERY     biopsy   . COLON SURGERY    . PERIPHERAL VASCULAR INTERVENTION  10/10/2017   Procedure: PERIPHERAL VASCULAR INTERVENTION;  Surgeon: Serafina Mitchell, MD;  Location: [facility];  Service: Cardiovascular;;  lt. Iliac  . SMALL INTESTINE SURGERY    . SURGERY FOR BOWEL OBSTRUCTION AND REMOVAL OF PART OF STOMACH  IN 2000  2000  . TOTAL KNEE ARTHROPLASTY  02/16/2012   Procedure: TOTAL KNEE ARTHROPLASTY;  Surgeon: Johnn Hai, MD;  Location: [facility];  Service: Orthopedics;  Laterality: Left;  . TOTAL KNEE ARTHROPLASTY Right 11/20/2014   Procedure: RIGHT TOTAL KNEE ARTHROPLASTY;  Surgeon: Johnn Hai, MD;  Location: [facility];  Service: Orthopedics;  Laterality: Right;    Social History   Socioeconomic History  . Marital status: Single    Spouse name: Not on file  . Number of children: 0  . Years of education: 62  . Highest education level: Not on file  Occupational History  . Not on file  Social Needs  . Financial resource strain: Not on file  . Food insecurity:    Worry: Not on file    Inability: Not on file  . Transportation needs:    Medical: Not on file    Non-medical: Not on file  Tobacco Use  . Smoking status: Former Smoker    Packs/day: 2.00    Years: 20.00    Pack years: 40.00    Types: Cigarettes    Last attempt to quit: 10/17/1988    Years since quitting: 29.6  . Smokeless tobacco: Never Used  Substance and Sexual Activity  . Alcohol use: No  . Drug use: No    Comment: Marijuana - 25 years ago  . Sexual activity: Not on file  Lifestyle  . Physical activity:    Days per  week: Not on file    Minutes per session: Not on file  . Stress: Not on file  Relationships  . Social connections:    Talks on phone: Not on file    Gets together: Not on file    Attends religious service: Not on file    Active member of club or organization: Not on file    Attends meetings of clubs or organizations: Not on file    Relationship status: Not on file  . Intimate partner violence:    Fear of current or ex partner: Not on file    Emotionally abused: Not on file    Physically abused: Not on file    Forced sexual activity: Not on file  Other Topics Concern  . Not on file  Social History Narrative   Patient is single and lives alone.   Patient is disabled.   Patient has a college education.   Patient is right-handed.   Patient drinks two cokes per day.   Family History  Problem Relation Age of Onset  . Sleep apnea Father   . Hypertension Father   . Hypertension Mother   . Multiple sclerosis Sister   . Multiple sclerosis Sister       VITAL SIGNS BP 138/88   Pulse 89   Temp 98.3 F (36.8 C)   Resp 18   Ht 5' 5" (1.651 m)   Wt 197 lb 11.2 oz (89.7 kg)   LMP  (LMP Unknown)   SpO2 96%   BMI 32.90 kg/m   Outpatient Encounter Medications as of 05/24/2018  Medication Sig  . Amino Acids-Protein Hydrolys (FEEDING SUPPLEMENT, PRO-STAT SUGAR FREE 64,) LIQD Take 30 mLs by mouth 3 (three) times daily.  Marland Kitchen amiodarone (PACERONE) 200 MG tablet Take 1 tablet (200 mg total) by mouth daily.  . benztropine (COGENTIN) 0.5 MG tablet Take 1.5 mg by mouth 2 (two) times daily.   Marland Kitchen buPROPion (WELLBUTRIN XL) 150 MG 24 hr tablet Take 150 mg by mouth 2 (two) times daily.   . carvedilol (COREG) 25 MG tablet Take 1 tablet (25 mg total) by mouth 2 (two) times daily with a meal.  . cyanocobalamin (,VITAMIN B-12,) 1000 MCG/ML injection Inject 1,000 mcg into the muscle every 30 (thirty) days.  Marland Kitchen diltiazem (CARDIZEM) 30 MG tablet Take 30 mg by mouth every 12 (twelve) hours.  Marland Kitchen ELIQUIS 5  MG TABS tablet Take 5 mg by mouth 2 (two) times daily.  . fluticasone (FLONASE) 50 MCG/ACT nasal spray Place 1 spray into both nostrils daily as needed for allergies.   Marland Kitchen iron polysaccharides (NIFEREX) 150 MG capsule Take 150 mg by mouth daily.  Marland Kitchen levothyroxine (SYNTHROID,  LEVOTHROID) 25 MCG tablet Take 25 mcg by mouth daily before breakfast.  . Magnesium Oxide 400 MG CAPS Take 1 capsule (400 mg total) by mouth daily.  . Multiple Vitamin (MULTIVITAMIN) tablet Take 1 tablet by mouth daily.  . Nutritional Supplements (NUTRITIONAL SUPPLEMENT PO) Regular Diet - Regular Texture  . rosuvastatin (CRESTOR) 5 MG tablet Take 5 mg by mouth daily.  Marland Kitchen saccharomyces boulardii (FLORASTOR) 250 MG capsule Take 1 capsule (250 mg total) by mouth 2 (two) times daily.  Marland Kitchen vortioxetine HBr (TRINTELLIX) 5 MG TABS tablet Take 5 mg by mouth daily.   No facility-administered encounter medications on file as of 05/24/2018.      SIGNIFICANT DIAGNOSTIC EXAMS  PREVIOUS:   04-02-18: ct of head: 1. Bilateral occipital lobe/PCA territory acute infarcts versusbPRES. RIGHT occipital lobe petechial hemorrhage. 2. Small potentially acute LEFT MCA territory infarct or PRES. [place] LEFT MCA territory infarct, further propagation from prior CT. 3. Moderate parenchymal brain volume loss. Moderate chronic smallbvessel ischemic changes.  04-02-18: MRI of brain: 1. Severe motion degradation. 2. T2 hyperintense signal abnormality involving cortex andbsubcortical white matter in a posterior distribution. Scattered cortical reduced diffusion. Findings are consistent with PRES and atypical for ischemia. No gross acute hemorrhage identified.  04-02-18: chest x-ray: No acute abnormalities.  04-02-18: left foot x-ray: No acute abnormalities. Degenerative changes as described. Chronic soft tissue swelling lateral to the fifth MTP joint.  04-02-18: right foot x-ray: No acute bone abnormality. No radiographic evidence suggestive of  osteomyelitis. Soft tissue abscess on the ball of the foot as described.  04-04-18: MRI/MRA of head:  MRI head: 1. Stable distribution of T2 FLAIR signal abnormality involving subcortical white matter and cortex in the posterior distribution. Diminished diffusion signal abnormality. 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 2. No new acute intracranial abnormality identified. 3. 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 MRA head: 1. Motion degraded study, suboptimal assessment for small aneurysm or stenosis. 2. Patent anterior and posterior intracranial circulation. No large vessel occlusion.  NO NEW EXAMS    LABS REVIEWED: PREVIOUS:  04-02-18: wbc 13.5; hgb 12.9; hct 43.4; mcv 84.8; plt 530; glucose 135; bun 37; creat 1.40; k+ 4.0; na++ 141; ca 9.3; ast 74; albumin 2.9 blood culture: no growth; urine culture:  e-coli;  04-04-18: wbc 8.8; hgb 10.8; hct 35.8; mcv 83.4; plt 340; glucose 109; bun 14; creat 0.96; k+ 3.2 na++ 136; ca 8.6 mag 1.6; phos 2.3 04-08-18: vit B 12: 371; folate 6.1; iron 19; tibc 190; ferritin 32 04-10-18: wbc 7.8; hgb 10.7; hct 35.9; mcv 84.5 plt 384; glucose 106; bun 17; creat 0.85; k+ 4.1; na++ 137; ca 8.6   04-23-18: wbc 6.1; hgb 10.3; hct 33.7; mcv 81; plt 418; glucose 109; bun 37; creat 1.30; k+ 3.9; na++ 139; ca 9.2 alk phos 147; albumin 3.2 tsh 5.530   LABS REVIEWED TODAY:   05-21-18: tsh 7.100   Review of Systems  Constitutional: Negative for malaise/fatigue.  Respiratory: Negative for cough and shortness of breath.   Cardiovascular: Negative for chest pain, palpitations and leg swelling.  Gastrointestinal: Negative for abdominal pain, constipation and heartburn.  Musculoskeletal: Negative for back pain, joint pain and myalgias.  Skin: Negative.   Neurological: Negative for dizziness.  Psychiatric/Behavioral: The patient is not nervous/anxious.      Physical Exam  Constitutional: She appears well-developed and well-nourished. No distress.  Neck: No thyromegaly present.  Cardiovascular: Normal rate, normal heart  sounds and intact distal pulses.  Heart rate irregularly irregular   Pulmonary/Chest: Effort normal and breath sounds normal. No respiratory distress.  Abdominal: Soft. Bowel sounds are normal. She exhibits no distension. There is no tenderness.  Musculoskeletal: She exhibits no edema.  Is able to move all extremities   Lymphadenopathy:    She has no cervical adenopathy.  Neurological: She is alert.  Skin: Skin is warm and dry. She is not diaphoretic.  Left great toe: stage II 5 x 5 mm Left second toe: II 4 x 4 mm Right great toe: 5 x 5 mm Left lateral foot: stage II: 5 x 5 mm Left heel: stage II 5 x 5 mm Right great toe bunion stage II; 7 x 7 mm   Psychiatric: She has a normal mood and affect.    ASSESSMENT/ PLAN:  TODAY:   1.  Anemia: is stable hgb 10.7; will continue niferex 150 mg daily; vit B 12 injection monthly  2. Mild episode of recurrent major depressive disorder: is stable will continue wellbturin xr 150 mg twice daily trintellix 5 mg daily  3. Hypothyroidism due to amiodarone: is worse: tsh 7.100; will increase synthroid to 50 mcg daily and will repeat labs in 4 weeks.   4. Essential hypertension: is stable b/p 138/88: will continue coreg 25 mg twice daily cardizem 30 mg twice daily  PREVIOUS   5. Atrial fibrillation with RVR: heart rate is stable will continue coreg 25 mg twice daily and cardizem 30 mg twice daily  amiodarone 200 mg twice daily for rate control will continue eliquis 5 mg twice daily   6. Periodic limb movement disorder (PLMD): is stable will continue cogentin 1.5 mg twice daily   7. Dyslipidemia: is stable will continue crestor 5 mg daily   8. PRES (posterior reversible encephalopathy syndrome) is stable will monitor   9. Hypomagnesemia: is stable will continue magox daily    Will check stat cbc with diff; bmp In the AM will check lipids and magnesium   MD is aware of resident's narcotic use and is in agreement with current plan of care. We will attempt to wean resident as apropriate   Ok Edwards NP [facility]  Contact [phone] Monday through Friday 8am- 5pm  After hours call [phone]

## 2018-05-25 DIAGNOSIS — E785 Hyperlipidemia, unspecified: Secondary | ICD-10-CM | POA: Diagnosis not present

## 2018-05-25 DIAGNOSIS — Z79899 Other long term (current) drug therapy: Secondary | ICD-10-CM | POA: Diagnosis not present

## 2018-05-25 LAB — LIPID PANEL
Cholesterol: 111 (ref 0–200)
HDL: 40 (ref 35–70)
LDL CALC: 54
TRIGLYCERIDES: 87 (ref 40–160)

## 2018-05-29 ENCOUNTER — Ambulatory Visit (INDEPENDENT_AMBULATORY_CARE_PROVIDER_SITE_OTHER): Payer: PPO | Admitting: Infectious Diseases

## 2018-05-29 DIAGNOSIS — L89322 Pressure ulcer of left buttock, stage 2: Secondary | ICD-10-CM | POA: Diagnosis not present

## 2018-05-29 DIAGNOSIS — M869 Osteomyelitis, unspecified: Secondary | ICD-10-CM | POA: Diagnosis not present

## 2018-05-29 DIAGNOSIS — E039 Hypothyroidism, unspecified: Secondary | ICD-10-CM | POA: Diagnosis not present

## 2018-05-29 DIAGNOSIS — I6783 Posterior reversible encephalopathy syndrome: Secondary | ICD-10-CM | POA: Diagnosis not present

## 2018-05-29 DIAGNOSIS — L97521 Non-pressure chronic ulcer of other part of left foot limited to breakdown of skin: Secondary | ICD-10-CM | POA: Diagnosis not present

## 2018-05-29 DIAGNOSIS — Z79899 Other long term (current) drug therapy: Secondary | ICD-10-CM | POA: Diagnosis not present

## 2018-05-29 LAB — TSH: TSH: 9.82 — AB (ref 0.41–5.90)

## 2018-05-29 NOTE — Assessment & Plan Note (Signed)
I'm not sure if her current mental status is a function of this.  She needs a thorough exam by her primary team.

## 2018-05-29 NOTE — Assessment & Plan Note (Signed)
Will continue her off anbx She has vascular and pressure ulcers on her feet.  She needs wound care (I have ordered on her SNF order form).  Her current wounds do not look grossly infected.  Please have her return in 6 weeks if her health allows.

## 2018-05-29 NOTE — Progress Notes (Signed)
   Subjective:    Patient ID: [name], female    DOB: 1950-12-15, 67 y.o.   MRN: [id number]  HPI 67 y.o.F with hx of foot wounds related to her PVD (femoral stent 10-2016) and neuropathy. She has had long standing ulcers on both feet which have been progressive despite po anbx. She has also had care at wound care center.  I last saw her on 03/21/2018 he noted that she had a R foot ulcer. She was worried about continued wound d/c. She was placed on doxy for 1 month.  On 04-03-18 she was found encephalopathic and covered in urine and feces. She was brought to the emergency department where she was afebrile. MRI of her brain suggested possible PRES.she has a history of diabetes, peripheral vascular disease and peripheral neuropathy. She was started on vanco/cefepime, her plain films that did not show osteo. She was noted to have a soft tissue abscess under first MT head. Her anbx were changed to merrem to cover a ESBL E coli UTI. She had MRI that showed no evidence of deep infection.  Her anbx were stopped on 7-15.  She was seen in ID f/u on 7-31 and was kept off anbx. She was to see vascular, podiatry, WOC in f/u.  She is now wheelchair bound, more sedentary.  There is a note in her chart that her daughter is more concerned about her mental status. The SNF staff note that it is unchanged. In clinic today she tracks to voice but rearely answers. When asked how she feels she looks at nursing aide for help.  Her WBC was 5.4.  In clinic today, she is alert but vocalizes only once. She nods to questions. She denies any needs.   131/85   94.3 ax  Review of Systems  Unable to perform ROS: Mental status change       Objective:   Physical Exam  Constitutional: No distress.  Feet:  Right Foot:  Skin Integrity: Positive for ulcer and callus.  Left Foot:  Skin Integrity: Positive for ulcer and callus.  Neurological: She is alert.  she is alert but vocalizes only once. She nods to  questions. She denies any needs.   Skin: She is not diaphoretic.   R foot- 3 dorsal wounds- 2 of which are "blood blisters". There is no heat. The other wound is an ulcer, no d/c or odor.  On the plantar surface, there is an os which has no d/c.  L foot- 2 plantar wounds. These are clean.            Assessment & Plan:

## 2018-05-31 ENCOUNTER — Non-Acute Institutional Stay (SKILLED_NURSING_FACILITY): Payer: PPO | Admitting: Adult Health

## 2018-05-31 ENCOUNTER — Encounter: Payer: Self-pay | Admitting: Adult Health

## 2018-05-31 DIAGNOSIS — Z79899 Other long term (current) drug therapy: Secondary | ICD-10-CM | POA: Diagnosis not present

## 2018-05-31 DIAGNOSIS — R7989 Other specified abnormal findings of blood chemistry: Secondary | ICD-10-CM | POA: Diagnosis not present

## 2018-05-31 DIAGNOSIS — N179 Acute kidney failure, unspecified: Secondary | ICD-10-CM | POA: Diagnosis not present

## 2018-05-31 DIAGNOSIS — D649 Anemia, unspecified: Secondary | ICD-10-CM | POA: Diagnosis not present

## 2018-05-31 LAB — CBC AND DIFFERENTIAL
HCT: 44 (ref 36–46)
Hemoglobin: 13.4 (ref 12.0–16.0)
Neutrophils Absolute: 12
PLATELETS: 297 (ref 150–399)
WBC: 14

## 2018-05-31 LAB — HEPATIC FUNCTION PANEL
ALK PHOS: 152 — AB (ref 25–125)
ALT: 22 (ref 7–35)
AST: 26 (ref 13–35)
Bilirubin, Total: 0.5

## 2018-05-31 LAB — BASIC METABOLIC PANEL
BUN: 36 — AB (ref 4–21)
CREATININE: 1.2 — AB (ref 0.5–1.1)
Glucose: 71
POTASSIUM: 3.8 (ref 3.4–5.3)
Sodium: 149 — AB (ref 137–147)

## 2018-05-31 NOTE — Progress Notes (Signed)
Location:   [facility] Room Number: 102 A Place of Service:  SNF (31)   CODE STATUS: Full Code  Allergies  Allergen Reactions  . Zolpidem Tartrate Other (See Comments)    Delusions, hallucinations  . Amoxicillin Rash and Other (See Comments)    Has patient had a PCN reaction causing immediate rash, facial/tongue/throat swelling, SOB or lightheadedness with hypotension: Yes Has patient had a PCN reaction causing severe rash involving mucus membranes or skin necrosis: No Has patient had a PCN reaction that required hospitalization: No Has patient had a PCN reaction occurring within the last 10 years: Yes If all of the above answers are "NO", then may proceed with Cephalosporin use.     Chief Complaint  Patient presents with  . Acute Visit    Change in Status    HPI:  Staff reports that she has not had po intake over the past 3 days. She is unable to participate in the hpi or ros. There are no reports of fevers present; no indications of pain present. Nursing staff is concerned that she is dehydrated.    Past Medical History:  Diagnosis Date  . Anxiety   . 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  PT ALSO HAS LOWER BACK PAIN AT TIMES  . Depression   . GERD (gastroesophageal reflux disease)    RARE- OTC ANTACID IF NEEDED--PAST HX OF ULCER  . Headache    wakes up with headache   . Hyperlipidemia   . Hypertension   . Kidney stones    PT PASSED STONES--NO KNOWN STONES AT PRESENT TIMES  . Lumbago 10/02/2013  . Obesity   . OSA (obstructive sleep apnea) 12/30/2013   Very mild OSA with REM accentuation,  tested 11-29-13 at [facility] sleep, Dr Rexene Alberts .   Marland Kitchen Periodic limb movement disorder (PLMD) 12/30/2013  . Restless leg syndrome   . Ulcer   . Urinary incontinence     Past Surgical History:  Procedure Laterality Date  . ABDOMINAL AORTOGRAM W/LOWER EXTREMITY N/A 10/10/2017   Procedure: ABDOMINAL AORTOGRAM W/LOWER EXTREMITY;  Surgeon: Serafina Mitchell,  MD;  Location: [place] [facility];  Service: Cardiovascular;  Laterality: N/A;  . BILATERAL BUNIONECTOMY 1979    . BREAST SURGERY     biopsy   . COLON SURGERY    . PERIPHERAL VASCULAR INTERVENTION  10/10/2017   Procedure: PERIPHERAL VASCULAR INTERVENTION;  Surgeon: Serafina Mitchell, MD;  Location: [place] [facility];  Service: Cardiovascular;;  lt. Iliac  . SMALL INTESTINE SURGERY    . SURGERY FOR BOWEL OBSTRUCTION AND REMOVAL OF PART OF STOMACH  IN 2000  2000  . TOTAL KNEE ARTHROPLASTY  02/16/2012   Procedure: TOTAL KNEE ARTHROPLASTY;  Surgeon: Johnn Hai, MD;  Location: [facility];  Service: Orthopedics;  Laterality: Left;  . TOTAL KNEE ARTHROPLASTY Right 11/20/2014   Procedure: RIGHT TOTAL KNEE ARTHROPLASTY;  Surgeon: Johnn Hai, MD;  Location: [facility];  Service: Orthopedics;  Laterality: Right;    Social History   Socioeconomic History  . Marital status: Single    Spouse name: Not on file  . Number of children: 0  . Years of education: 16  . Highest education level: Not on file  Occupational History  . Not on file  Social Needs  . Financial resource strain: Not on file  . Food insecurity:    Worry: Not on file    Inability: Not on file  . Transportation needs:  Medical: Not on file    Non-medical: Not on file  Tobacco Use  . Smoking status: Former Smoker    Packs/day: 2.00    Years: 20.00    Pack years: 40.00    Types: Cigarettes    Last attempt to quit: 10/17/1988    Years since quitting: 29.6  . Smokeless tobacco: Never Used  Substance and Sexual Activity  . Alcohol use: No  . Drug use: No    Comment: Marijuana - 25 years ago  . Sexual activity: Not on file  Lifestyle  . Physical activity:    Days per week: Not on file    Minutes per session: Not on file  . Stress: Not on file  Relationships  . Social connections:    Talks on phone: Not on file    Gets together: Not on file    Attends religious service: Not on file    Active member of club or  organization: Not on file    Attends meetings of clubs or organizations: Not on file    Relationship status: Not on file  . Intimate partner violence:    Fear of current or ex partner: Not on file    Emotionally abused: Not on file    Physically abused: Not on file    Forced sexual activity: Not on file  Other Topics Concern  . Not on file  Social History Narrative   Patient is single and lives alone.   Patient is disabled.   Patient has a college education.   Patient is right-handed.   Patient drinks two cokes per day.   Family History  Problem Relation Age of Onset  . Sleep apnea Father   . Hypertension Father   . Hypertension Mother   . Multiple sclerosis Sister   . Multiple sclerosis Sister       VITAL SIGNS BP 118/60   Pulse 80   Temp (!) 97.1 F (36.2 C)   Resp 18   Ht 5' 5"  (1.651 m)   Wt 197 lb 9.6 oz (89.6 kg)   LMP  (LMP Unknown)   SpO2 97%   BMI 32.88 kg/m   Outpatient Encounter Medications as of 05/31/2018  Medication Sig  . Amino Acids-Protein Hydrolys (FEEDING SUPPLEMENT, PRO-STAT SUGAR FREE 64,) LIQD Take 30 mLs by mouth 3 (three) times daily.  Marland Kitchen amiodarone (PACERONE) 200 MG tablet Take 1 tablet (200 mg total) by mouth daily.  . benztropine (COGENTIN) 0.5 MG tablet Take 1.5 mg by mouth 2 (two) times daily.   Marland Kitchen buPROPion (WELLBUTRIN XL) 150 MG 24 hr tablet Take 150 mg by mouth 2 (two) times daily.   . carvedilol (COREG) 25 MG tablet Take 1 tablet (25 mg total) by mouth 2 (two) times daily with a meal.  . Cranberry 450 MG TABS Take 1 tablet by mouth daily.  . cyanocobalamin (,VITAMIN B-12,) 1000 MCG/ML injection Inject 1,000 mcg into the muscle every 30 (thirty) days.  Marland Kitchen diltiazem (CARDIZEM) 30 MG tablet Take 30 mg by mouth every 12 (twelve) hours.  Marland Kitchen ELIQUIS 5 MG TABS tablet Take 5 mg by mouth 2 (two) times daily.  . fluticasone (FLONASE) 50 MCG/ACT nasal spray Place 1 spray into both nostrils daily as needed for allergies.   Marland Kitchen iron polysaccharides  (NIFEREX) 150 MG capsule Take 150 mg by mouth daily.  Marland Kitchen levothyroxine (SYNTHROID, LEVOTHROID) 50 MCG tablet Take 50 mcg by mouth daily before breakfast.   . Magnesium Oxide 400 MG CAPS Take 1  capsule (400 mg total) by mouth daily.  . Multiple Vitamin (MULTIVITAMIN) tablet Take 1 tablet by mouth daily.  . Nutritional Supplements (NUTRITIONAL SUPPLEMENT PO) Regular Diet - Regular Texture  . rosuvastatin (CRESTOR) 5 MG tablet Take 5 mg by mouth daily.  Marland Kitchen saccharomyces boulardii (FLORASTOR) 250 MG capsule Take 1 capsule (250 mg total) by mouth 2 (two) times daily.  Marland Kitchen vortioxetine HBr (TRINTELLIX) 5 MG TABS tablet Take 5 mg by mouth daily.   No facility-administered encounter medications on file as of 05/31/2018.      SIGNIFICANT DIAGNOSTIC EXAMS  PREVIOUS:   04-02-18: ct of head: 1. Bilateral occipital lobe/PCA territory acute infarcts versusbPRES. RIGHT occipital lobe petechial hemorrhage. 2. Small potentially acute LEFT MCA territory infarct or PRES. [place] LEFT MCA territory infarct, further propagation from prior CT. 3. Moderate parenchymal brain volume loss. Moderate chronic smallbvessel ischemic changes.  04-02-18: MRI of brain: 1. Severe motion degradation. 2. T2 hyperintense signal abnormality involving cortex andbsubcortical white matter in a posterior distribution. Scattered cortical reduced diffusion. Findings are consistent with PRES and atypical for ischemia. No gross acute hemorrhage identified.  04-02-18: chest x-ray: No acute abnormalities.  04-02-18: left foot x-ray: No acute abnormalities. Degenerative changes as described. Chronic soft tissue swelling lateral to the fifth MTP joint.  04-02-18: right foot x-ray: No acute bone abnormality. No radiographic evidence suggestive of osteomyelitis. Soft tissue abscess on the ball of the foot as described.  04-04-18: MRI/MRA of head:  MRI head: 1. Stable distribution of T2 FLAIR signal abnormality involving subcortical white matter and  cortex in the posterior distribution. Diminished diffusion signal abnormality. 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 2. No new acute intracranial abnormality identified. 3. 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 MRA head: 1. Motion degraded study, suboptimal assessment for small aneurysm or stenosis. 2. Patent anterior and posterior intracranial circulation. No large vessel occlusion.  NO NEW EXAMS    LABS REVIEWED: PREVIOUS:  04-02-18: wbc 13.5; hgb 12.9; hct 43.4; mcv 84.8; plt 530; glucose 135; bun 37; creat 1.40; k+ 4.0; na++ 141; ca 9.3; ast 74; albumin 2.9 blood culture: no growth; urine culture:  e-coli;  04-04-18: wbc 8.8; hgb 10.8; hct 35.8; mcv 83.4; plt 340; glucose 109; bun 14; creat 0.96; k+ 3.2 na++ 136; ca 8.6 mag 1.6; phos 2.3 04-08-18: vit B 12: 371; folate 6.1; iron 19; tibc 190; ferritin 32 04-10-18: wbc 7.8; hgb 10.7; hct 35.9; mcv 84.5 plt 384; glucose 106; bun 17; creat 0.85; k+ 4.1; na++ 137; ca 8.6   04-23-18: wbc 6.1; hgb 10.3; hct 33.7; mcv 81; plt 418; glucose 109; bun 37; creat 1.30; k+ 3.9; na++ 139; ca 9.2 alk phos 147; albumin 3.2 tsh 5.530  05-21-18: tsh 7.100  TODAY:   05-29-18: tsh 9.82; free T4: 1.37; free T3: 1.8    Review of Systems  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 No distress.  HENT:  Oral mucosa is dry tongue is dry.   Neck: No thyromegaly present.  Cardiovascular: Normal rate, regular rhythm, normal heart sounds and intact distal pulses.  Pulmonary/Chest: Effort normal and breath sounds normal. No respiratory distress.  Abdominal: Soft. Bowel sounds are normal. She exhibits no distension. There is no tenderness.  Musculoskeletal: She exhibits no edema.  Lymphadenopathy:    She has no cervical adenopathy.  Neurological:  Is aware  Skin: Skin  is warm and dry. She is not diaphoretic.      ASSESSMENT/ PLAN:  TODAY:   1.  Acute renal failure:   Will get stat cbc; cmp Will begin IVF NS at 100 cc per hour for 3 liters. Will monitor her status.    MD is aware of resident's narcotic use and is in agreement with current plan of care. We will attempt to wean resident as apropriate   Ok Edwards NP [facility] Adult [facility]  Contact [phone] Monday through Friday 8am- 5pm  After hours call [phone]

## 2018-06-04 NOTE — Progress Notes (Signed)
[id number]   

## 2018-06-05 DIAGNOSIS — Z79899 Other long term (current) drug therapy: Secondary | ICD-10-CM | POA: Diagnosis not present

## 2018-06-05 DIAGNOSIS — L97521 Non-pressure chronic ulcer of other part of left foot limited to breakdown of skin: Secondary | ICD-10-CM | POA: Diagnosis not present

## 2018-06-05 DIAGNOSIS — N390 Urinary tract infection, site not specified: Secondary | ICD-10-CM | POA: Diagnosis not present

## 2018-06-05 DIAGNOSIS — L89322 Pressure ulcer of left buttock, stage 2: Secondary | ICD-10-CM | POA: Diagnosis not present

## 2018-06-06 ENCOUNTER — Non-Acute Institutional Stay (SKILLED_NURSING_FACILITY): Payer: PPO | Admitting: Adult Health

## 2018-06-06 ENCOUNTER — Encounter: Payer: Self-pay | Admitting: Adult Health

## 2018-06-06 DIAGNOSIS — R627 Adult failure to thrive: Secondary | ICD-10-CM | POA: Diagnosis not present

## 2018-06-06 DIAGNOSIS — N179 Acute kidney failure, unspecified: Secondary | ICD-10-CM

## 2018-06-06 DIAGNOSIS — R7989 Other specified abnormal findings of blood chemistry: Secondary | ICD-10-CM | POA: Diagnosis not present

## 2018-06-06 DIAGNOSIS — Z79899 Other long term (current) drug therapy: Secondary | ICD-10-CM | POA: Diagnosis not present

## 2018-06-06 LAB — CBC AND DIFFERENTIAL
HEMATOCRIT: 42 (ref 36–46)
Hemoglobin: 13 (ref 12.0–16.0)
NEUTROS ABS: 7
Platelets: 256 (ref 150–399)
WBC: 8.8

## 2018-06-06 LAB — BASIC METABOLIC PANEL
BUN: 30 — AB (ref 4–21)
Creatinine: 1.3 — AB (ref 0.5–1.1)
GLUCOSE: 97
Potassium: 3.6 (ref 3.4–5.3)
Sodium: 160 — AB (ref 137–147)

## 2018-06-06 NOTE — Progress Notes (Signed)
Location:   [facility] Room Number: 102 A Place of Service:  SNF (31)   CODE STATUS: DNR (Most form and DNR effective on 06/06/18)  Allergies  Allergen Reactions  . Zolpidem Tartrate Other (See Comments)    Delusions, hallucinations  . Amoxicillin Rash and Other (See Comments)    Has patient had a PCN reaction causing immediate rash, facial/tongue/throat swelling, SOB or lightheadedness with hypotension: Yes Has patient had a PCN reaction causing severe rash involving mucus membranes or skin necrosis: No Has patient had a PCN reaction that required hospitalization: No Has patient had a PCN reaction occurring within the last 10 years: Yes If all of the above answers are "NO", then may proceed with Cephalosporin use.     Chief Complaint  Patient presents with  . Acute Visit    Care Plan Meeting    HPI:  We have come together to discuss her overall status. Her brother is present via phone. She continues to decline is not able to take medications; is not taking anything by mouth. She does continue on IVF and IV INvanz. We have spoken at great length. He feels as though that she would not like living the way she is. He does not want any further interventions done he does want her medications stopped and wants to call in hospice. He does want her kept comfortable; and does want all of her medications stopped. We have discussed that she is terminal. We have filled out her MOST form.    Past Medical History:  Diagnosis Date  . Anxiety   . 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  PT ALSO HAS LOWER BACK PAIN AT TIMES  . Depression   . GERD (gastroesophageal reflux disease)    RARE- OTC ANTACID IF NEEDED--PAST HX OF ULCER  . Headache    wakes up with headache   . Hyperlipidemia   . Hypertension   . Kidney stones    PT PASSED STONES--NO KNOWN STONES AT PRESENT TIMES  . Lumbago 10/02/2013  . Obesity   . OSA (obstructive sleep apnea) 12/30/2013   Very mild  OSA with REM accentuation,  tested 11-29-13 at [facility] sleep, Dr Rexene Alberts .   Marland Kitchen Periodic limb movement disorder (PLMD) 12/30/2013  . Restless leg syndrome   . Ulcer   . Urinary incontinence     Past Surgical History:  Procedure Laterality Date  . ABDOMINAL AORTOGRAM W/LOWER EXTREMITY N/A 10/10/2017   Procedure: ABDOMINAL AORTOGRAM W/LOWER EXTREMITY;  Surgeon: Serafina Mitchell, MD;  Location: [place] [facility];  Service: Cardiovascular;  Laterality: N/A;  . BILATERAL BUNIONECTOMY 1979    . BREAST SURGERY     biopsy   . COLON SURGERY    . PERIPHERAL VASCULAR INTERVENTION  10/10/2017   Procedure: PERIPHERAL VASCULAR INTERVENTION;  Surgeon: Serafina Mitchell, MD;  Location: [place] [facility];  Service: Cardiovascular;;  lt. Iliac  . SMALL INTESTINE SURGERY    . SURGERY FOR BOWEL OBSTRUCTION AND REMOVAL OF PART OF STOMACH  IN 2000  2000  . TOTAL KNEE ARTHROPLASTY  02/16/2012   Procedure: TOTAL KNEE ARTHROPLASTY;  Surgeon: Johnn Hai, MD;  Location: [facility];  Service: Orthopedics;  Laterality: Left;  . TOTAL KNEE ARTHROPLASTY Right 11/20/2014   Procedure: RIGHT TOTAL KNEE ARTHROPLASTY;  Surgeon: Johnn Hai, MD;  Location: [facility];  Service: Orthopedics;  Laterality: Right;    Social History   Socioeconomic History  . Marital status: Single  Spouse name: Not on file  . Number of children: 0  . Years of education: 60  . Highest education level: Not on file  Occupational History  . Not on file  Social Needs  . Financial resource strain: Not on file  . Food insecurity:    Worry: Not on file    Inability: Not on file  . Transportation needs:    Medical: Not on file    Non-medical: Not on file  Tobacco Use  . Smoking status: Former Smoker    Packs/day: 2.00    Years: 20.00    Pack years: 40.00    Types: Cigarettes    Last attempt to quit: 10/17/1988    Years since quitting: 29.6  . Smokeless tobacco: Never Used  Substance and Sexual Activity  . Alcohol use: No  . Drug use:  No    Comment: Marijuana - 25 years ago  . Sexual activity: Not on file  Lifestyle  . Physical activity:    Days per week: Not on file    Minutes per session: Not on file  . Stress: Not on file  Relationships  . Social connections:    Talks on phone: Not on file    Gets together: Not on file    Attends religious service: Not on file    Active member of club or organization: Not on file    Attends meetings of clubs or organizations: Not on file    Relationship status: Not on file  . Intimate partner violence:    Fear of current or ex partner: Not on file    Emotionally abused: Not on file    Physically abused: Not on file    Forced sexual activity: Not on file  Other Topics Concern  . Not on file  Social History Narrative   Patient is single and lives alone.   Patient is disabled.   Patient has a college education.   Patient is right-handed.   Patient drinks two cokes per day.   Family History  Problem Relation Age of Onset  . Sleep apnea Father   . Hypertension Father   . Hypertension Mother   . Multiple sclerosis Sister   . Multiple sclerosis Sister       VITAL SIGNS Ht _0  (1.651 m)   Wt 197 lb 9.6 oz (89.6 kg)   LMP  (LMP Unknown)   BMI 32.88 kg/m   Outpatient Encounter Medications as of 06/06/2018  Medication Sig  . Amino Acids-Protein Hydrolys (FEEDING SUPPLEMENT, PRO-STAT SUGAR FREE 64,) LIQD Take 30 mLs by mouth 3 (three) times daily.  Marland Kitchen amiodarone (PACERONE) 200 MG tablet Take 1 tablet (200 mg total) by mouth daily.  . benztropine (COGENTIN) 0.5 MG tablet Take 1.5 mg by mouth 2 (two) times daily.   Marland Kitchen buPROPion (WELLBUTRIN XL) 150 MG 24 hr tablet Take 150 mg by mouth 2 (two) times daily.   . carvedilol (COREG) 25 MG tablet Take 1 tablet (25 mg total) by mouth 2 (two) times daily with a meal.  . Cranberry 450 MG TABS Take 1 tablet by mouth daily.  . cyanocobalamin (,VITAMIN B-12,) 1000 MCG/ML injection Inject 1,000 mcg into the muscle every 30 (thirty)  days.  Marland Kitchen diltiazem (CARDIZEM) 30 MG tablet Take 30 mg by mouth every 12 (twelve) hours.  Marland Kitchen ELIQUIS 5 MG TABS tablet Take 5 mg by mouth 2 (two) times daily.  Marland Kitchen ENSURE (ENSURE) Take 237 mLs by mouth. Give 120 ml by mouth two  times daily x 30 days ending on 10/9/019  . ertapenem 1 g in sodium chloride 0.9 % 100 mL Inject 1 g into the vein every 12 (twelve) hours. X 5 days, ending on 06/09/18  . fluticasone (FLONASE) 50 MCG/ACT nasal spray Place 1 spray into both nostrils daily as needed for allergies.   Marland Kitchen iron polysaccharides (NIFEREX) 150 MG capsule Take 150 mg by mouth daily.  . Levothyroxine Sodium 75 MCG CAPS Take 75 mcg by mouth daily before breakfast.   . Magnesium Oxide 400 MG CAPS Take 1 capsule (400 mg total) by mouth daily.  . Multiple Vitamin (MULTIVITAMIN) tablet Take 1 tablet by mouth daily.  . Nutritional Supplements (NUTRITIONAL SUPPLEMENT PO) Regular Diet - Regular Texture  . rosuvastatin (CRESTOR) 5 MG tablet Take 5 mg by mouth daily.   Marland Kitchen saccharomyces boulardii (FLORASTOR) 250 MG capsule Take 1 capsule (250 mg total) by mouth 2 (two) times daily.  . sodium chloride 0.45 % Use 100 ml/hr intravenously every day and night shift for abnormal labs  . vortioxetine HBr (TRINTELLIX) 5 MG TABS tablet Take 5 mg by mouth daily.   No facility-administered encounter medications on file as of 06/06/2018.      SIGNIFICANT DIAGNOSTIC EXAMS   PREVIOUS:   04-02-18: ct of head: 1. Bilateral occipital lobe/PCA territory acute infarcts versusbPRES. RIGHT occipital lobe petechial hemorrhage. 2. Small potentially acute LEFT MCA territory infarct or PRES. [place] LEFT MCA territory infarct, further propagation from prior CT. 3. Moderate parenchymal brain volume loss. Moderate chronic smallbvessel ischemic changes.  04-02-18: MRI of brain: 1. Severe motion degradation. 2. T2 hyperintense signal abnormality involving cortex andbsubcortical white matter in a posterior distribution. Scattered cortical  reduced diffusion. Findings are consistent with PRES and atypical for ischemia. No gross acute hemorrhage identified.  04-02-18: chest x-ray: No acute abnormalities.  04-02-18: left foot x-ray: No acute abnormalities. Degenerative changes as described. Chronic soft tissue swelling lateral to the fifth MTP joint.  04-02-18: right foot x-ray: No acute bone abnormality. No radiographic evidence suggestive of osteomyelitis. Soft tissue abscess on the ball of the foot as described.  04-04-18: MRI/MRA of head:  MRI head: 1. Stable distribution of T2 FLAIR signal abnormality involving subcortical white matter and cortex in the posterior distribution. Diminished diffusion signal abnormality. 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 2. No new acute intracranial abnormality identified. 3. 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 MRA head: 1. Motion degraded study, suboptimal assessment for small aneurysm or stenosis. 2. Patent anterior and posterior intracranial circulation. No large vessel occlusion.  NO NEW EXAMS    LABS REVIEWED: PREVIOUS:  04-02-18: wbc 13.5; hgb 12.9; hct 43.4; mcv 84.8; plt 530; glucose 135; bun 37; creat 1.40; k+ 4.0; na++ 141; ca 9.3; ast 74; albumin 2.9 blood culture: no growth; urine culture:  e-coli;  04-04-18: wbc 8.8; hgb 10.8; hct 35.8; mcv 83.4; plt 340; glucose 109; bun 14; creat 0.96; k+ 3.2 na++ 136; ca 8.6 mag 1.6; phos 2.3 04-08-18: vit B 12: 371; folate 6.1; iron 19; tibc 190; ferritin 32 04-10-18: wbc 7.8; hgb 10.7; hct 35.9; mcv 84.5 plt 384; glucose 106; bun 17; creat 0.85; k+ 4.1; na++ 137; ca 8.6   04-23-18: wbc 6.1; hgb 10.3; hct 33.7; mcv 81; plt 418; glucose 109; bun 37; creat 1.30; k+ 3.9; na++ 139; ca 9.2 alk phos 147; albumin 3.2 tsh 5.530  05-21-18: tsh 7.100 05-29-18: tsh 9.82; free  T4: 1.37; free T3: 1.8   Review of Systems  Unable to perform ROS: Other  (not aware )    Physical Exam  Constitutional: No distress.  HENT:  Oral mucosa is dry   Neck: No thyromegaly present.  Cardiovascular: Normal rate, regular rhythm, normal heart sounds and intact distal pulses.  Pulmonary/Chest: Effort normal and breath sounds normal. No respiratory distress.  Abdominal: Soft. Bowel sounds are normal. She exhibits no distension. There is no tenderness.  Musculoskeletal: She exhibits no edema.  Lymphadenopathy:    She has no cervical adenopathy.  Neurological:  Not aware of her surroundings.   Skin: Skin is warm and dry. She is not diaphoretic.   ASSESSMENT/ PLAN:  TODAY;   1. Failure to thrive 2. Acute renal failure  Will stop all medications and future lab work Will setup hospice consult.  MOST form filled out; DNR comfort care only.   Time spent with patient and family: 45 minutes: discussed overall status; medications; goals of care; 25 minutes spent with advanced directives MOST form filled out: voiced udnerstanding.   MD is aware of resident's narcotic use and is in agreement with current plan of care. We will attempt to wean resident as apropriate   Ok Edwards NP [facility] Adult Medicine  Contact [phone] Monday through Friday 8am- 5pm  After hours call [phone]

## 2018-06-07 ENCOUNTER — Encounter: Payer: Self-pay | Admitting: Adult Health

## 2018-06-12 DIAGNOSIS — R627 Adult failure to thrive: Secondary | ICD-10-CM | POA: Insufficient documentation

## 2018-06-12 DIAGNOSIS — N179 Acute kidney failure, unspecified: Secondary | ICD-10-CM | POA: Insufficient documentation

## 2018-06-13 ENCOUNTER — Institutional Professional Consult (permissible substitution): Payer: PPO | Admitting: Neurology

## 2018-06-15 IMAGING — CT CT HEAD W/O CM
4 series · 17 of 47 positions shown, 19 images · non-contrast
Comparison: 11/08/2013 MR

CLINICAL DATA: 65-year-old female with dizziness for 1 day.

EXAM:
CT HEAD WITHOUT CONTRAST
TECHNIQUE: Contiguous axial images were obtained from the base of the skull
through the vertex without intravenous contrast.

[Series 3: head wo · axial · 0.41mm/px · z∈[-204,-84]mm · 7 of 34 slices shown, 9 images]
[im 5/34  brain]
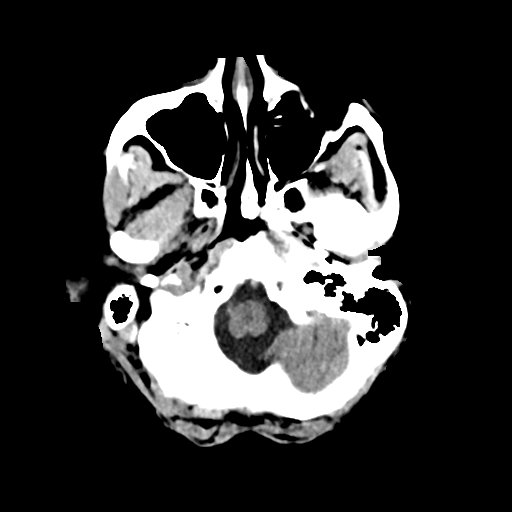
[im 5/34  bone]
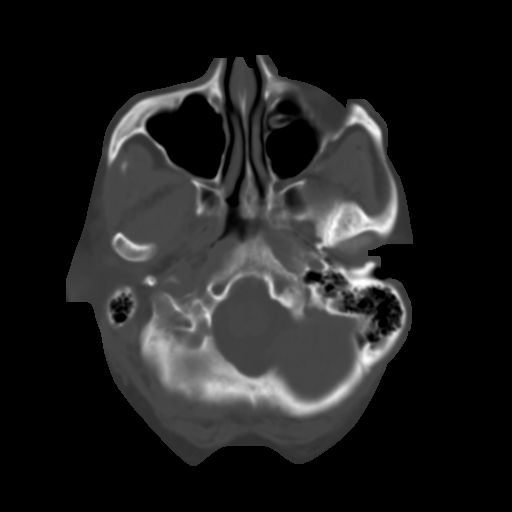
[im 9/34  brain]
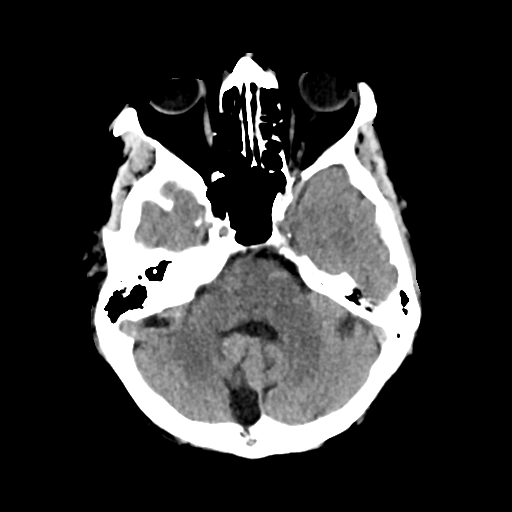
[im 13/34  brain]
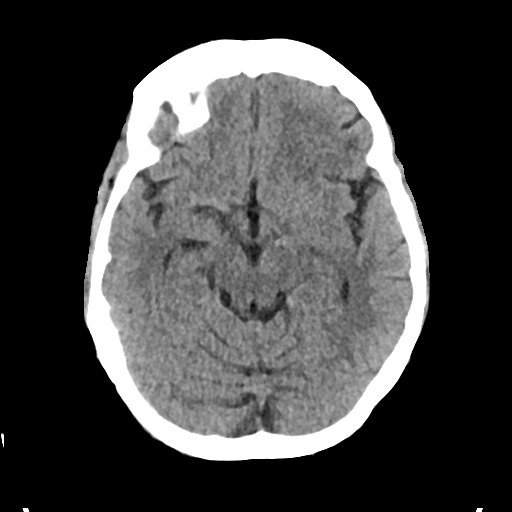
[im 17/34  brain]
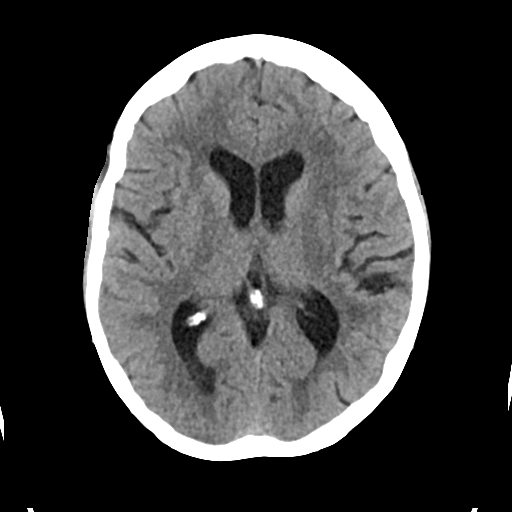
[im 21/34  brain]
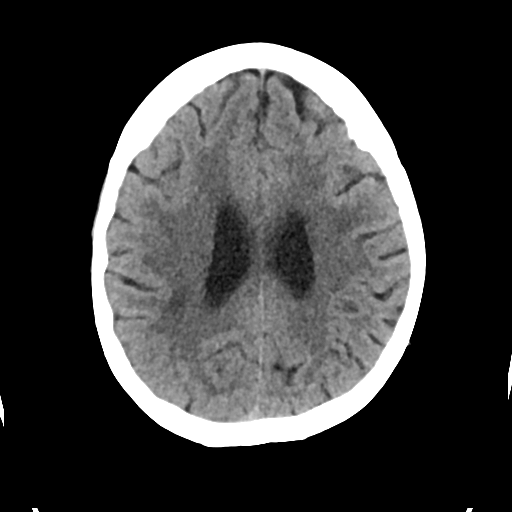
[im 21/34  bone]
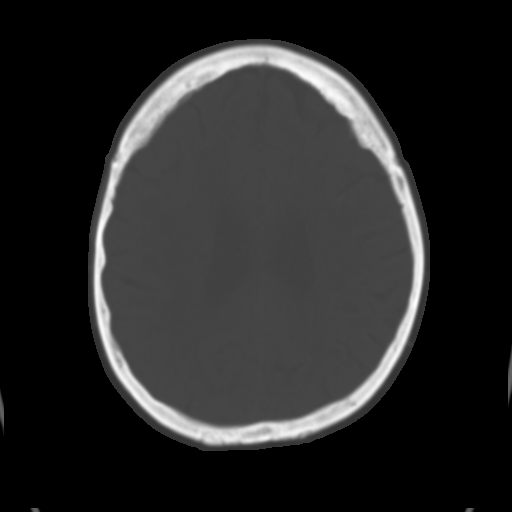
[im 25/34  brain]
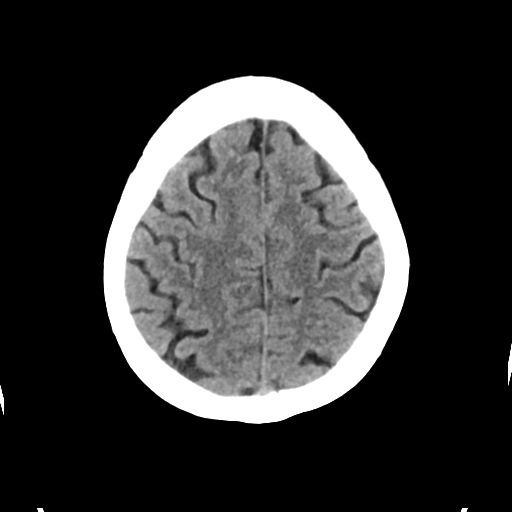
[im 29/34  brain]
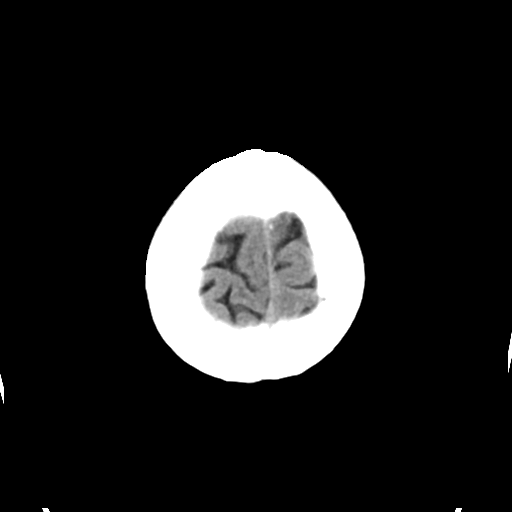

[Series 4: head bone · axial · 0.41mm/px · z∈[-208,-150]mm · 4 of 85 slices shown]
[im 9/85  bone]
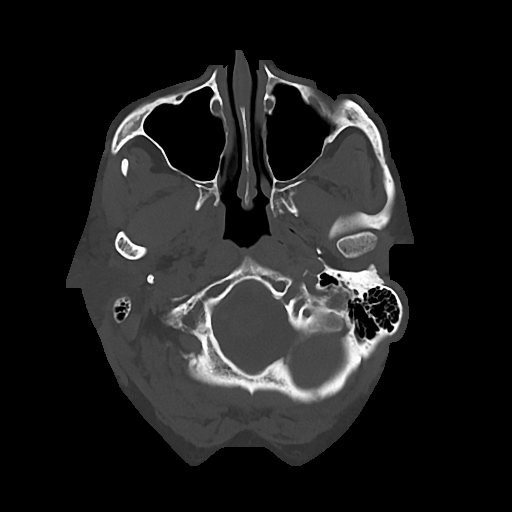
[im 17/85  bone]
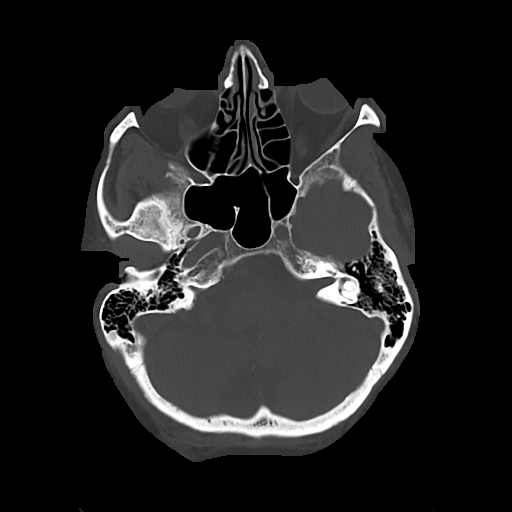
[im 26/85  bone]
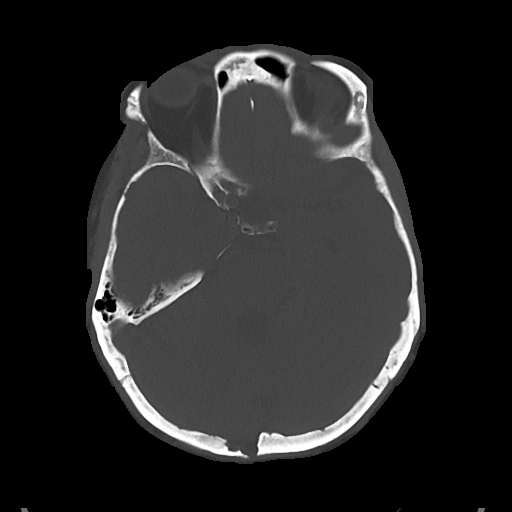
[im 38/85  bone]
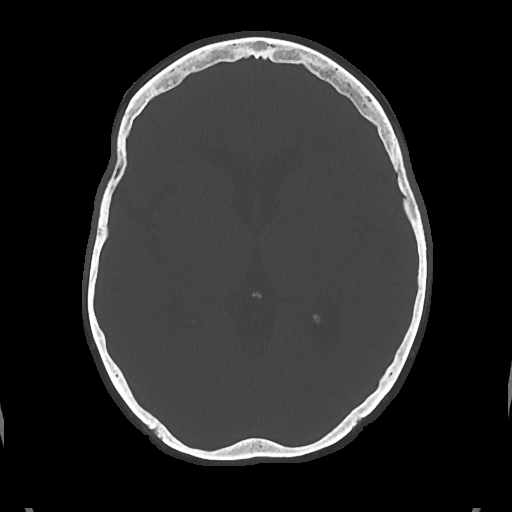

[Series 5: cor soft · coronal · 0.33mm/px · 3 of 69 slices shown]
[im 23/69  brain]
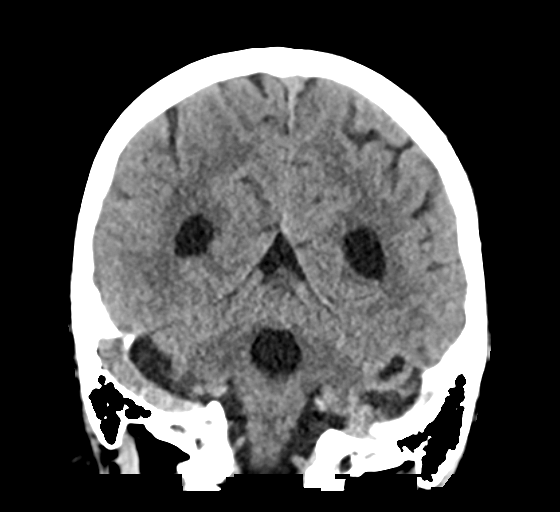
[im 31/69  brain]
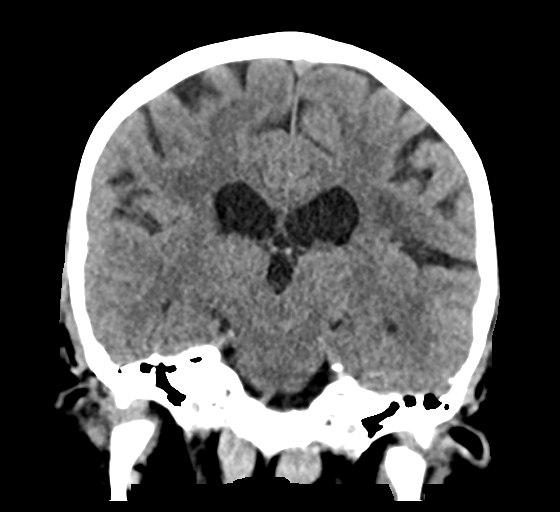
[im 38/69  brain]
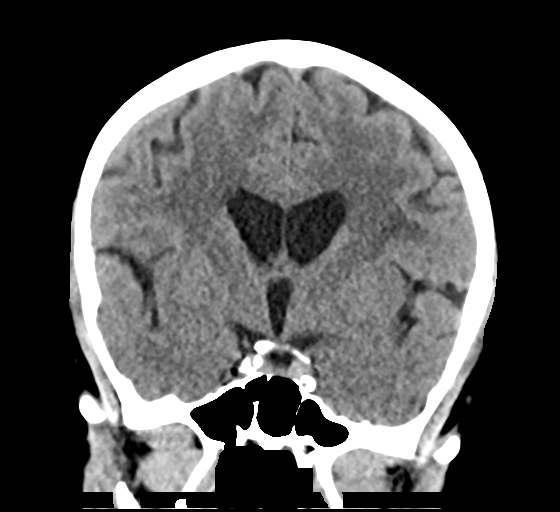

[Series 6: sag soft · sagittal · 0.33mm/px · 3 of 67 slices shown]
[im 23/67  brain]
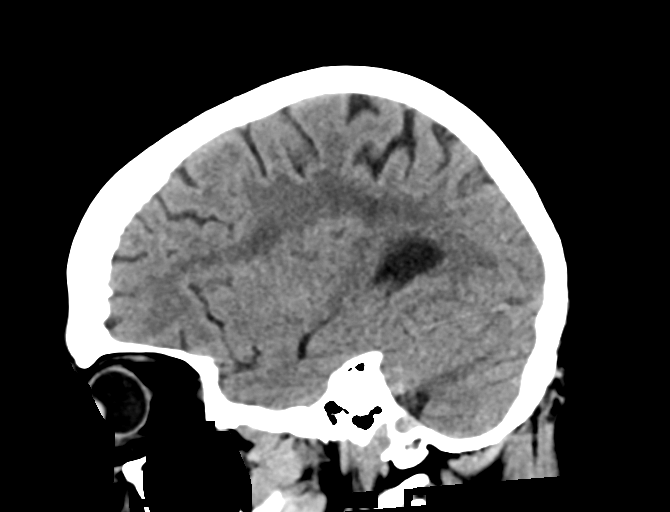
[im 34/67  brain]
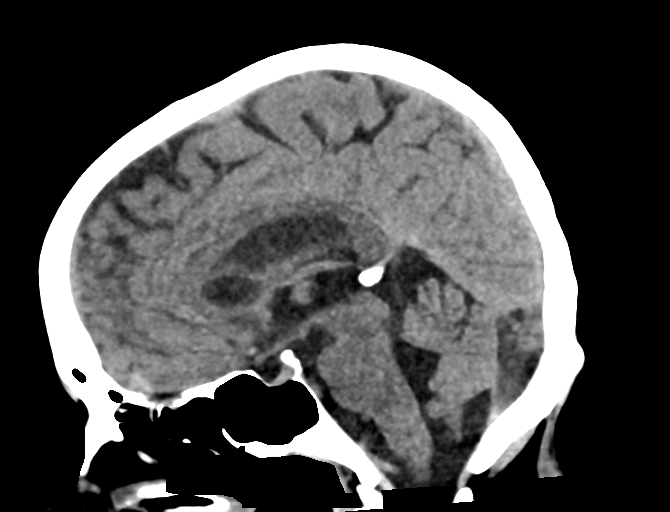
[im 45/67  brain]
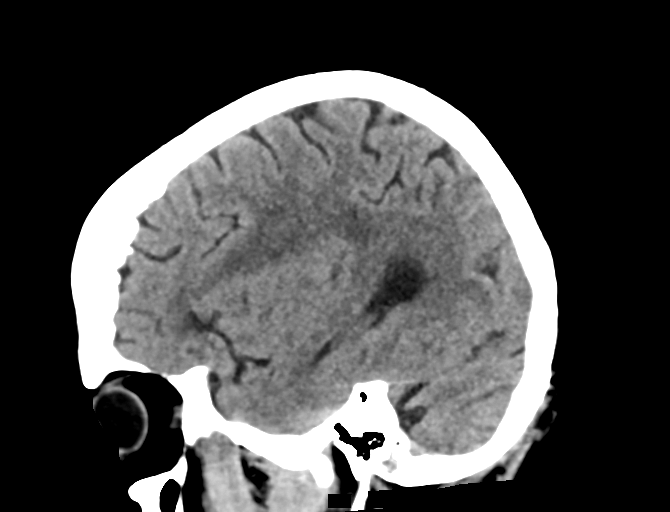

[17 of 47 positions shown; findings below may reference images not displayed]

FINDINGS: Brain: No evidence of acute infarction, hemorrhage, hydrocephalus,
extra-axial collection or mass lesion/mass effect.

Chronic white matter hypodensities are unchanged.

Vascular: Intracranial atherosclerotic calcifications noted.

Skull: Normal. Negative for fracture or focal lesion.

Sinuses/Orbits: No acute finding.

Other: None
IMPRESSION: No evidence of acute intracranial abnormality.

Unchanged white matter disease -likely chronic small-vessel ischemic
changes.

## 2018-06-26 DEATH — deceased

## 2018-07-26 ENCOUNTER — Encounter: Payer: Self-pay | Admitting: *Deleted

## 2018-07-26 DIAGNOSIS — F419 Anxiety disorder, unspecified: Secondary | ICD-10-CM | POA: Insufficient documentation

## 2018-07-26 DIAGNOSIS — E119 Type 2 diabetes mellitus without complications: Secondary | ICD-10-CM | POA: Insufficient documentation

## 2018-08-13 ENCOUNTER — Ambulatory Visit: Payer: PPO | Admitting: Cardiology
